# Patient Record
Sex: Female | Born: 1960 | Hispanic: No | Marital: Married | Smoking: Never smoker
Health system: Southern US, Community
[De-identification: ages and names within clinical notes are randomized; demographics above are authoritative.]

## PROBLEM LIST (undated history)

## (undated) DIAGNOSIS — E559 Vitamin D deficiency, unspecified: Secondary | ICD-10-CM

## (undated) DIAGNOSIS — F4322 Adjustment disorder with anxiety: Secondary | ICD-10-CM

## (undated) DIAGNOSIS — K219 Gastro-esophageal reflux disease without esophagitis: Secondary | ICD-10-CM

## (undated) DIAGNOSIS — N390 Urinary tract infection, site not specified: Secondary | ICD-10-CM

## (undated) DIAGNOSIS — Z86718 Personal history of other venous thrombosis and embolism: Secondary | ICD-10-CM

## (undated) DIAGNOSIS — E538 Deficiency of other specified B group vitamins: Secondary | ICD-10-CM

## (undated) DIAGNOSIS — Z7901 Long term (current) use of anticoagulants: Secondary | ICD-10-CM

## (undated) DIAGNOSIS — R079 Chest pain, unspecified: Secondary | ICD-10-CM

## (undated) DIAGNOSIS — M069 Rheumatoid arthritis, unspecified: Secondary | ICD-10-CM

## (undated) DIAGNOSIS — H9209 Otalgia, unspecified ear: Secondary | ICD-10-CM

## (undated) DIAGNOSIS — E785 Hyperlipidemia, unspecified: Secondary | ICD-10-CM

## (undated) DIAGNOSIS — Z8619 Personal history of other infectious and parasitic diseases: Secondary | ICD-10-CM

## (undated) DIAGNOSIS — R002 Palpitations: Secondary | ICD-10-CM

## (undated) DIAGNOSIS — K760 Fatty (change of) liver, not elsewhere classified: Secondary | ICD-10-CM

## (undated) DIAGNOSIS — R51 Headache: Secondary | ICD-10-CM

## (undated) DIAGNOSIS — D509 Iron deficiency anemia, unspecified: Secondary | ICD-10-CM

## (undated) HISTORY — DX: Personal history of other infectious and parasitic diseases: Z86.19

## (undated) HISTORY — DX: Urinary tract infection, site not specified: N39.0

## (undated) HISTORY — DX: Palpitations: R00.2

## (undated) HISTORY — DX: Chest pain, unspecified: R07.9

## (undated) HISTORY — DX: Hyperlipidemia, unspecified: E78.5

## (undated) HISTORY — DX: Gastro-esophageal reflux disease without esophagitis: K21.9

## (undated) HISTORY — DX: Rheumatoid arthritis, unspecified: M06.9

## (undated) HISTORY — DX: Otalgia, unspecified ear: H92.09

## (undated) HISTORY — DX: Headache: R51

## (undated) HISTORY — DX: Iron deficiency anemia, unspecified: D50.9

## (undated) HISTORY — DX: Deficiency of other specified B group vitamins: E53.8

## (undated) HISTORY — DX: Adjustment disorder with anxiety: F43.22

## (undated) HISTORY — DX: Personal history of other venous thrombosis and embolism: Z86.718

## (undated) HISTORY — DX: Vitamin D deficiency, unspecified: E55.9

## (undated) HISTORY — DX: Fatty (change of) liver, not elsewhere classified: K76.0

## (undated) HISTORY — DX: Long term (current) use of anticoagulants: Z79.01

## (undated) HISTORY — PX: ORIF ULNAR FRACTURE: SHX5417

---

## 1999-05-20 ENCOUNTER — Other Ambulatory Visit: Admission: RE | Admit: 1999-05-20 | Discharge: 1999-05-20 | Payer: Self-pay | Admitting: Obstetrics and Gynecology

## 1999-12-10 ENCOUNTER — Encounter: Payer: Self-pay | Admitting: Obstetrics and Gynecology

## 1999-12-10 ENCOUNTER — Encounter: Admission: RE | Admit: 1999-12-10 | Discharge: 1999-12-10 | Payer: Self-pay | Admitting: Obstetrics and Gynecology

## 1999-12-17 ENCOUNTER — Encounter: Admission: RE | Admit: 1999-12-17 | Discharge: 1999-12-17 | Payer: Self-pay | Admitting: Emergency Medicine

## 1999-12-17 ENCOUNTER — Encounter: Payer: Self-pay | Admitting: Emergency Medicine

## 1999-12-27 ENCOUNTER — Encounter: Admission: RE | Admit: 1999-12-27 | Discharge: 1999-12-27 | Payer: Self-pay | Admitting: Emergency Medicine

## 1999-12-27 ENCOUNTER — Encounter: Payer: Self-pay | Admitting: Emergency Medicine

## 2000-07-28 ENCOUNTER — Other Ambulatory Visit: Admission: RE | Admit: 2000-07-28 | Discharge: 2000-07-28 | Payer: Self-pay | Admitting: Unknown Physician Specialty

## 2001-01-01 ENCOUNTER — Encounter: Admission: RE | Admit: 2001-01-01 | Discharge: 2001-01-01 | Payer: Self-pay | Admitting: Obstetrics and Gynecology

## 2001-01-01 ENCOUNTER — Encounter: Payer: Self-pay | Admitting: Obstetrics and Gynecology

## 2001-12-08 ENCOUNTER — Other Ambulatory Visit: Admission: RE | Admit: 2001-12-08 | Discharge: 2001-12-08 | Payer: Self-pay

## 2002-01-06 ENCOUNTER — Encounter: Admission: RE | Admit: 2002-01-06 | Discharge: 2002-01-06 | Payer: Self-pay | Admitting: Obstetrics and Gynecology

## 2002-01-06 ENCOUNTER — Encounter: Payer: Self-pay | Admitting: Obstetrics and Gynecology

## 2003-01-09 ENCOUNTER — Encounter: Admission: RE | Admit: 2003-01-09 | Discharge: 2003-01-09 | Payer: Self-pay | Admitting: Obstetrics and Gynecology

## 2003-01-09 ENCOUNTER — Encounter: Payer: Self-pay | Admitting: Obstetrics and Gynecology

## 2003-02-21 ENCOUNTER — Encounter: Admission: RE | Admit: 2003-02-21 | Discharge: 2003-02-21 | Payer: Self-pay | Admitting: Emergency Medicine

## 2003-02-21 ENCOUNTER — Encounter: Payer: Self-pay | Admitting: Emergency Medicine

## 2003-12-21 ENCOUNTER — Other Ambulatory Visit: Admission: RE | Admit: 2003-12-21 | Discharge: 2003-12-21 | Payer: Self-pay | Admitting: Obstetrics and Gynecology

## 2004-01-01 ENCOUNTER — Encounter: Admission: RE | Admit: 2004-01-01 | Discharge: 2004-01-01 | Payer: Self-pay | Admitting: Obstetrics and Gynecology

## 2005-02-17 ENCOUNTER — Other Ambulatory Visit: Admission: RE | Admit: 2005-02-17 | Discharge: 2005-02-17 | Payer: Self-pay | Admitting: Obstetrics and Gynecology

## 2005-02-26 ENCOUNTER — Encounter: Admission: RE | Admit: 2005-02-26 | Discharge: 2005-02-26 | Payer: Self-pay | Admitting: Obstetrics and Gynecology

## 2005-03-04 ENCOUNTER — Emergency Department (HOSPITAL_COMMUNITY): Admission: EM | Admit: 2005-03-04 | Discharge: 2005-03-04 | Payer: Self-pay | Admitting: Emergency Medicine

## 2005-03-07 ENCOUNTER — Encounter (HOSPITAL_COMMUNITY): Admission: RE | Admit: 2005-03-07 | Discharge: 2005-06-05 | Payer: Self-pay | Admitting: Emergency Medicine

## 2005-04-17 ENCOUNTER — Inpatient Hospital Stay (HOSPITAL_COMMUNITY): Admission: EM | Admit: 2005-04-17 | Discharge: 2005-04-23 | Payer: Self-pay | Admitting: Emergency Medicine

## 2005-04-17 ENCOUNTER — Encounter: Admission: RE | Admit: 2005-04-17 | Discharge: 2005-04-17 | Payer: Self-pay | Admitting: Emergency Medicine

## 2005-04-29 ENCOUNTER — Ambulatory Visit: Payer: Self-pay | Admitting: Internal Medicine

## 2005-05-01 ENCOUNTER — Ambulatory Visit: Payer: Self-pay | Admitting: Cardiology

## 2005-05-01 ENCOUNTER — Ambulatory Visit: Payer: Self-pay

## 2005-05-03 ENCOUNTER — Encounter: Admission: RE | Admit: 2005-05-03 | Discharge: 2005-05-03 | Payer: Self-pay | Admitting: Emergency Medicine

## 2005-05-05 ENCOUNTER — Ambulatory Visit: Payer: Self-pay | Admitting: Cardiology

## 2005-05-08 ENCOUNTER — Ambulatory Visit: Payer: Self-pay | Admitting: Cardiology

## 2005-05-13 ENCOUNTER — Ambulatory Visit: Payer: Self-pay | Admitting: Internal Medicine

## 2005-05-16 ENCOUNTER — Encounter: Admission: RE | Admit: 2005-05-16 | Discharge: 2005-05-16 | Payer: Self-pay | Admitting: Emergency Medicine

## 2005-05-20 ENCOUNTER — Ambulatory Visit: Payer: Self-pay | Admitting: *Deleted

## 2005-05-21 ENCOUNTER — Ambulatory Visit: Payer: Self-pay

## 2005-05-21 ENCOUNTER — Encounter: Payer: Self-pay | Admitting: Cardiovascular Disease

## 2005-05-30 ENCOUNTER — Ambulatory Visit: Payer: Self-pay | Admitting: Internal Medicine

## 2005-06-11 ENCOUNTER — Encounter: Admission: RE | Admit: 2005-06-11 | Discharge: 2005-06-11 | Payer: Self-pay | Admitting: Obstetrics and Gynecology

## 2005-06-12 ENCOUNTER — Ambulatory Visit: Payer: Self-pay | Admitting: Cardiology

## 2005-06-26 ENCOUNTER — Ambulatory Visit: Payer: Self-pay | Admitting: *Deleted

## 2005-07-10 ENCOUNTER — Ambulatory Visit: Payer: Self-pay | Admitting: Cardiology

## 2005-07-17 ENCOUNTER — Encounter: Admission: RE | Admit: 2005-07-17 | Discharge: 2005-07-17 | Payer: Self-pay | Admitting: Emergency Medicine

## 2005-07-24 ENCOUNTER — Ambulatory Visit: Payer: Self-pay | Admitting: Cardiology

## 2005-08-07 ENCOUNTER — Ambulatory Visit: Payer: Self-pay | Admitting: Cardiology

## 2005-08-21 ENCOUNTER — Ambulatory Visit: Payer: Self-pay | Admitting: Cardiology

## 2005-09-04 ENCOUNTER — Ambulatory Visit: Payer: Self-pay | Admitting: Internal Medicine

## 2005-09-15 ENCOUNTER — Ambulatory Visit: Payer: Self-pay | Admitting: Cardiology

## 2005-09-22 ENCOUNTER — Ambulatory Visit: Payer: Self-pay | Admitting: Cardiology

## 2005-09-29 ENCOUNTER — Ambulatory Visit: Payer: Self-pay | Admitting: Cardiology

## 2005-10-01 ENCOUNTER — Ambulatory Visit: Payer: Self-pay | Admitting: Hematology and Oncology

## 2005-10-06 ENCOUNTER — Ambulatory Visit: Payer: Self-pay | Admitting: Internal Medicine

## 2005-11-06 ENCOUNTER — Encounter: Admission: RE | Admit: 2005-11-06 | Discharge: 2005-11-06 | Payer: Self-pay | Admitting: Emergency Medicine

## 2005-12-22 ENCOUNTER — Ambulatory Visit: Payer: Self-pay | Admitting: Hematology and Oncology

## 2005-12-24 LAB — CBC WITH DIFFERENTIAL/PLATELET
BASO%: 1 % (ref 0.0–2.0)
EOS%: 1.3 % (ref 0.0–7.0)
LYMPH%: 40.6 % (ref 14.0–48.0)
MCH: 27.8 pg (ref 26.0–34.0)
MCHC: 33.4 g/dL (ref 32.0–36.0)
MCV: 83.2 fL (ref 81.0–101.0)
MONO%: 6.9 % (ref 0.0–13.0)
NEUT#: 4.1 10*3/uL (ref 1.5–6.5)
Platelets: 330 10*3/uL (ref 145–400)
RBC: 4.91 10*6/uL (ref 3.70–5.32)
RDW: 12.9 % (ref 11.3–14.5)

## 2005-12-24 LAB — COMPREHENSIVE METABOLIC PANEL
AST: 13 U/L (ref 0–37)
Albumin: 4.3 g/dL (ref 3.5–5.2)
Alkaline Phosphatase: 81 U/L (ref 39–117)
Potassium: 4.3 mEq/L (ref 3.5–5.3)
Sodium: 138 mEq/L (ref 135–145)
Total Bilirubin: 0.6 mg/dL (ref 0.3–1.2)
Total Protein: 7.4 g/dL (ref 6.0–8.3)

## 2005-12-30 LAB — HYPERCOAGULABLE PANEL, COMPREHENSIVE
Anticardiolipin IgA: 25 [APL'U] (ref <13)
Anticardiolipin IgG: <7 [GPL'U] (ref <11)
Anticardiolipin IgM: <7 [MPL'U] (ref <10)
Beta-2 Glyco I IgG: <4 U/mL (ref <20)
Beta-2-Glycoprotein I IgM: <4 U/mL (ref <10)
PTT Lupus Anticoagulant: 40.3 secs (ref 36.3–48.8)
Protein C Activity: 143 % (ref 91–147)
Protein C, Total: 73 % (ref 63–153)

## 2005-12-30 LAB — TSH: TSH: 2.214 u[IU]/mL (ref 0.350–5.500)

## 2006-01-05 ENCOUNTER — Ambulatory Visit: Payer: Self-pay | Admitting: Internal Medicine

## 2006-01-09 ENCOUNTER — Ambulatory Visit: Payer: Self-pay | Admitting: Cardiovascular Disease

## 2006-01-16 ENCOUNTER — Ambulatory Visit: Payer: Self-pay | Admitting: Cardiology

## 2006-01-30 ENCOUNTER — Ambulatory Visit: Payer: Self-pay | Admitting: Cardiology

## 2006-02-09 ENCOUNTER — Ambulatory Visit: Payer: Self-pay | Admitting: Cardiology

## 2006-02-23 ENCOUNTER — Ambulatory Visit: Payer: Self-pay | Admitting: Cardiovascular Disease

## 2006-02-25 ENCOUNTER — Ambulatory Visit: Payer: Self-pay | Admitting: Cardiology

## 2006-03-02 ENCOUNTER — Encounter: Admission: RE | Admit: 2006-03-02 | Discharge: 2006-03-02 | Payer: Self-pay | Admitting: Obstetrics and Gynecology

## 2006-03-04 ENCOUNTER — Ambulatory Visit: Payer: Self-pay | Admitting: *Deleted

## 2006-03-11 ENCOUNTER — Ambulatory Visit: Payer: Self-pay

## 2006-03-17 ENCOUNTER — Ambulatory Visit: Payer: Self-pay | Admitting: Cardiology

## 2006-03-27 ENCOUNTER — Ambulatory Visit: Payer: Self-pay | Admitting: Cardiology

## 2006-04-07 ENCOUNTER — Other Ambulatory Visit: Admission: RE | Admit: 2006-04-07 | Discharge: 2006-04-07 | Payer: Self-pay | Admitting: Obstetrics and Gynecology

## 2006-04-10 ENCOUNTER — Ambulatory Visit: Payer: Self-pay | Admitting: Cardiology

## 2006-04-21 ENCOUNTER — Ambulatory Visit: Payer: Self-pay | Admitting: Cardiology

## 2006-05-05 ENCOUNTER — Ambulatory Visit: Payer: Self-pay | Admitting: Cardiology

## 2006-05-15 ENCOUNTER — Ambulatory Visit: Payer: Self-pay | Admitting: Cardiology

## 2006-05-19 LAB — HM COLONOSCOPY

## 2006-05-25 ENCOUNTER — Ambulatory Visit: Payer: Self-pay | Admitting: Hematology and Oncology

## 2006-05-28 LAB — CBC WITH DIFFERENTIAL/PLATELET
BASO%: 0.3 % (ref 0.0–2.0)
HCT: 40.9 % (ref 34.8–46.6)
LYMPH%: 35 % (ref 14.0–48.0)
MCHC: 32.9 g/dL (ref 32.0–36.0)
MONO#: 0.8 10*3/uL (ref 0.1–0.9)
NEUT%: 56.9 % (ref 39.6–76.8)
Platelets: 337 10*3/uL (ref 145–400)
WBC: 11.9 10*3/uL — ABNORMAL HIGH (ref 3.9–10.0)

## 2006-05-29 ENCOUNTER — Ambulatory Visit: Payer: Self-pay | Admitting: Internal Medicine

## 2006-06-01 LAB — CARDIOLIPIN ANTIBODIES, IGG, IGM, IGA: Anticardiolipin IgA: 32 [APL'U] (ref <13)

## 2006-06-01 LAB — LUPUS ANTICOAGULANT PANEL
DRVVT 1:1 Mix: 38.9 secs (ref 31.9–44.2)
DRVVT: 54.7 secs — ABNORMAL HIGH (ref 31.9–44.2)

## 2006-06-01 LAB — FACTOR 8 ASSAY: Coagulation Factor VIII: 177 % — ABNORMAL HIGH (ref 57–124)

## 2006-06-12 ENCOUNTER — Ambulatory Visit: Payer: Self-pay | Admitting: Internal Medicine

## 2006-06-26 ENCOUNTER — Ambulatory Visit: Payer: Self-pay | Admitting: *Deleted

## 2006-07-10 ENCOUNTER — Ambulatory Visit: Payer: Self-pay | Admitting: Internal Medicine

## 2006-07-20 ENCOUNTER — Ambulatory Visit: Payer: Self-pay | Admitting: Cardiovascular Disease

## 2006-07-20 ENCOUNTER — Ambulatory Visit: Payer: Self-pay | Admitting: Cardiology

## 2006-07-30 ENCOUNTER — Ambulatory Visit: Payer: Self-pay | Admitting: Cardiology

## 2006-08-13 ENCOUNTER — Ambulatory Visit: Payer: Self-pay | Admitting: Cardiology

## 2006-08-27 ENCOUNTER — Ambulatory Visit: Payer: Self-pay | Admitting: Cardiology

## 2006-09-10 ENCOUNTER — Ambulatory Visit: Payer: Self-pay | Admitting: Cardiology

## 2006-09-24 ENCOUNTER — Ambulatory Visit: Payer: Self-pay | Admitting: Cardiology

## 2006-10-09 ENCOUNTER — Ambulatory Visit: Payer: Self-pay | Admitting: Internal Medicine

## 2006-10-27 ENCOUNTER — Ambulatory Visit: Payer: Self-pay | Admitting: Cardiology

## 2006-11-09 ENCOUNTER — Ambulatory Visit: Payer: Self-pay | Admitting: Cardiovascular Disease

## 2006-11-12 ENCOUNTER — Encounter: Admission: RE | Admit: 2006-11-12 | Discharge: 2006-11-12 | Payer: Self-pay | Admitting: Emergency Medicine

## 2006-11-23 ENCOUNTER — Encounter: Admission: RE | Admit: 2006-11-23 | Discharge: 2006-11-23 | Payer: Self-pay | Admitting: Emergency Medicine

## 2006-11-23 ENCOUNTER — Ambulatory Visit: Payer: Self-pay | Admitting: Cardiovascular Disease

## 2006-12-25 ENCOUNTER — Ambulatory Visit: Payer: Self-pay | Admitting: Cardiology

## 2007-01-04 ENCOUNTER — Ambulatory Visit: Payer: Self-pay | Admitting: Cardiology

## 2007-01-29 ENCOUNTER — Ambulatory Visit: Payer: Self-pay | Admitting: Hematology and Oncology

## 2007-02-02 LAB — CBC WITH DIFFERENTIAL/PLATELET
Basophils Absolute: 0 10*3/uL (ref 0.0–0.1)
Eosinophils Absolute: 0.1 10*3/uL (ref 0.0–0.5)
HCT: 39.4 % (ref 34.8–46.6)
LYMPH%: 27.2 % (ref 14.0–48.0)
MCV: 80.4 fL — ABNORMAL LOW (ref 81.0–101.0)
MONO#: 0.6 10*3/uL (ref 0.1–0.9)
MONO%: 5.9 % (ref 0.0–13.0)
NEUT#: 6.6 10*3/uL — ABNORMAL HIGH (ref 1.5–6.5)
NEUT%: 65.7 % (ref 39.6–76.8)
Platelets: 393 10*3/uL (ref 145–400)
RBC: 4.9 10*6/uL (ref 3.70–5.32)

## 2007-02-02 LAB — IGG, IGA, IGM: IgG (Immunoglobin G), Serum: 1710 mg/dL — ABNORMAL HIGH (ref 694–1618)

## 2007-02-06 LAB — HYPERCOAGULABLE PANEL, COMPREHENSIVE
AntiThromb III Func: 114 % (ref 76–126)
Anticardiolipin IgA: 31 [APL'U] (ref <13)
Anticardiolipin IgG: <7 [GPL'U] (ref <11)
Anticardiolipin IgM: <7 [MPL'U] (ref <10)
Beta-2-Glycoprotein I IgA: 5 U/mL (ref <10)
DRVVT: 42.8 secs (ref 36.1–47.0)
PTT Lupus Anticoagulant: 48.8 secs (ref 36.3–48.8)
Protein C Activity: 134 % — ABNORMAL HIGH (ref 75–133)
Protein S Ag, Total: 140 % (ref 70–140)

## 2007-02-09 LAB — D-DIMER, QUANTITATIVE: D-Dimer, Quant: 0.23 ug/mL-FEU (ref 0.00–0.48)

## 2007-02-09 LAB — PROTEIN ELECTROPHORESIS, SERUM
Albumin ELP: 51.7 % — ABNORMAL LOW (ref 55.8–66.1)
Alpha-1-Globulin: 3.9 % (ref 2.9–4.9)
Alpha-2-Globulin: 9.8 % (ref 7.1–11.8)
Beta 2: 7 % — ABNORMAL HIGH (ref 3.2–6.5)
Gamma Globulin: 20.4 % — ABNORMAL HIGH (ref 11.1–18.8)

## 2007-03-04 ENCOUNTER — Encounter: Admission: RE | Admit: 2007-03-04 | Discharge: 2007-03-04 | Payer: Self-pay | Admitting: Obstetrics and Gynecology

## 2007-03-10 ENCOUNTER — Ambulatory Visit: Payer: Self-pay | Admitting: Cardiology

## 2007-03-18 ENCOUNTER — Ambulatory Visit: Payer: Self-pay | Admitting: Cardiology

## 2007-03-30 ENCOUNTER — Ambulatory Visit: Payer: Self-pay | Admitting: Cardiology

## 2007-03-30 ENCOUNTER — Ambulatory Visit: Payer: Self-pay | Admitting: Cardiovascular Disease

## 2007-03-31 ENCOUNTER — Ambulatory Visit: Payer: Self-pay

## 2007-04-13 ENCOUNTER — Ambulatory Visit: Payer: Self-pay | Admitting: Internal Medicine

## 2007-04-13 ENCOUNTER — Other Ambulatory Visit: Admission: RE | Admit: 2007-04-13 | Discharge: 2007-04-13 | Payer: Self-pay | Admitting: Obstetrics and Gynecology

## 2007-04-20 ENCOUNTER — Ambulatory Visit: Payer: Self-pay | Admitting: Cardiology

## 2007-04-30 ENCOUNTER — Ambulatory Visit: Payer: Self-pay | Admitting: Cardiology

## 2007-05-11 ENCOUNTER — Ambulatory Visit: Payer: Self-pay | Admitting: Cardiology

## 2007-05-18 ENCOUNTER — Ambulatory Visit: Payer: Self-pay | Admitting: Internal Medicine

## 2007-05-20 HISTORY — PX: TYMPANOSTOMY TUBE PLACEMENT: SHX32

## 2007-06-01 ENCOUNTER — Ambulatory Visit: Payer: Self-pay | Admitting: Cardiovascular Disease

## 2007-06-15 ENCOUNTER — Ambulatory Visit: Payer: Self-pay | Admitting: Cardiovascular Disease

## 2007-06-30 ENCOUNTER — Ambulatory Visit: Payer: Self-pay | Admitting: Cardiology

## 2007-07-13 ENCOUNTER — Ambulatory Visit: Payer: Self-pay | Admitting: Cardiology

## 2007-07-27 ENCOUNTER — Ambulatory Visit: Payer: Self-pay | Admitting: Cardiology

## 2007-08-10 ENCOUNTER — Ambulatory Visit: Payer: Self-pay | Admitting: Cardiovascular Disease

## 2007-08-25 ENCOUNTER — Ambulatory Visit: Payer: Self-pay | Admitting: Cardiovascular Disease

## 2007-09-01 ENCOUNTER — Ambulatory Visit: Payer: Self-pay | Admitting: Cardiology

## 2007-09-08 ENCOUNTER — Ambulatory Visit: Payer: Self-pay | Admitting: Cardiology

## 2007-09-22 ENCOUNTER — Ambulatory Visit: Payer: Self-pay | Admitting: Internal Medicine

## 2007-10-06 ENCOUNTER — Ambulatory Visit: Payer: Self-pay | Admitting: Cardiology

## 2007-10-21 ENCOUNTER — Ambulatory Visit: Payer: Self-pay | Admitting: Cardiology

## 2007-10-21 ENCOUNTER — Ambulatory Visit: Payer: Self-pay | Admitting: Internal Medicine

## 2007-10-27 ENCOUNTER — Encounter: Admission: RE | Admit: 2007-10-27 | Discharge: 2007-10-27 | Payer: Self-pay | Admitting: Otolaryngology

## 2007-11-11 ENCOUNTER — Ambulatory Visit: Payer: Self-pay | Admitting: Cardiology

## 2007-11-18 ENCOUNTER — Ambulatory Visit: Payer: Self-pay | Admitting: Cardiology

## 2007-12-09 ENCOUNTER — Ambulatory Visit: Payer: Self-pay | Admitting: Internal Medicine

## 2007-12-30 ENCOUNTER — Ambulatory Visit: Payer: Self-pay | Admitting: Internal Medicine

## 2008-01-06 ENCOUNTER — Encounter: Admission: RE | Admit: 2008-01-06 | Discharge: 2008-01-06 | Payer: Self-pay | Admitting: Gastroenterology

## 2008-01-20 ENCOUNTER — Ambulatory Visit: Payer: Self-pay | Admitting: Internal Medicine

## 2008-02-10 ENCOUNTER — Ambulatory Visit: Payer: Self-pay | Admitting: Cardiology

## 2008-02-17 LAB — HM MAMMOGRAPHY

## 2008-03-02 ENCOUNTER — Ambulatory Visit: Payer: Self-pay | Admitting: Internal Medicine

## 2008-03-06 ENCOUNTER — Encounter: Admission: RE | Admit: 2008-03-06 | Discharge: 2008-03-06 | Payer: Self-pay | Admitting: Obstetrics and Gynecology

## 2008-03-16 ENCOUNTER — Ambulatory Visit: Payer: Self-pay | Admitting: Cardiovascular Disease

## 2008-03-22 ENCOUNTER — Ambulatory Visit: Payer: Self-pay | Admitting: Cardiovascular Disease

## 2008-03-31 ENCOUNTER — Encounter: Payer: Self-pay | Admitting: Cardiology

## 2008-03-31 ENCOUNTER — Ambulatory Visit: Payer: Self-pay

## 2008-04-06 ENCOUNTER — Ambulatory Visit: Payer: Self-pay | Admitting: Cardiology

## 2008-04-18 ENCOUNTER — Ambulatory Visit: Payer: Self-pay | Admitting: Cardiovascular Disease

## 2008-04-18 ENCOUNTER — Other Ambulatory Visit: Admission: RE | Admit: 2008-04-18 | Discharge: 2008-04-18 | Payer: Self-pay | Admitting: Obstetrics and Gynecology

## 2008-04-18 LAB — HM PAP SMEAR

## 2008-04-25 ENCOUNTER — Encounter: Admission: RE | Admit: 2008-04-25 | Discharge: 2008-04-25 | Payer: Self-pay | Admitting: Rheumatology

## 2008-04-26 ENCOUNTER — Encounter: Payer: Self-pay | Admitting: Internal Medicine

## 2008-04-27 ENCOUNTER — Ambulatory Visit: Payer: Self-pay | Admitting: Cardiology

## 2008-05-18 ENCOUNTER — Ambulatory Visit: Payer: Self-pay | Admitting: Internal Medicine

## 2008-05-22 ENCOUNTER — Encounter: Payer: Self-pay | Admitting: Internal Medicine

## 2008-05-30 ENCOUNTER — Ambulatory Visit: Payer: Self-pay | Admitting: Internal Medicine

## 2008-05-30 DIAGNOSIS — E785 Hyperlipidemia, unspecified: Secondary | ICD-10-CM | POA: Insufficient documentation

## 2008-05-30 DIAGNOSIS — D509 Iron deficiency anemia, unspecified: Secondary | ICD-10-CM | POA: Insufficient documentation

## 2008-05-30 DIAGNOSIS — E559 Vitamin D deficiency, unspecified: Secondary | ICD-10-CM | POA: Insufficient documentation

## 2008-05-30 DIAGNOSIS — R51 Headache: Secondary | ICD-10-CM | POA: Insufficient documentation

## 2008-05-30 DIAGNOSIS — F4322 Adjustment disorder with anxiety: Secondary | ICD-10-CM | POA: Insufficient documentation

## 2008-05-30 DIAGNOSIS — R519 Headache, unspecified: Secondary | ICD-10-CM | POA: Insufficient documentation

## 2008-05-30 DIAGNOSIS — Z86718 Personal history of other venous thrombosis and embolism: Secondary | ICD-10-CM | POA: Insufficient documentation

## 2008-05-30 DIAGNOSIS — M069 Rheumatoid arthritis, unspecified: Secondary | ICD-10-CM | POA: Insufficient documentation

## 2008-05-30 DIAGNOSIS — H9209 Otalgia, unspecified ear: Secondary | ICD-10-CM | POA: Insufficient documentation

## 2008-05-31 ENCOUNTER — Encounter: Payer: Self-pay | Admitting: Internal Medicine

## 2008-06-07 ENCOUNTER — Ambulatory Visit: Payer: Self-pay | Admitting: Internal Medicine

## 2008-06-21 ENCOUNTER — Ambulatory Visit: Payer: Self-pay | Admitting: Cardiology

## 2008-07-05 ENCOUNTER — Encounter: Payer: Self-pay | Admitting: Internal Medicine

## 2008-07-06 ENCOUNTER — Ambulatory Visit: Payer: Self-pay | Admitting: Internal Medicine

## 2008-07-20 ENCOUNTER — Ambulatory Visit: Payer: Self-pay | Admitting: Cardiovascular Disease

## 2008-07-22 ENCOUNTER — Encounter: Admission: RE | Admit: 2008-07-22 | Discharge: 2008-07-22 | Payer: Self-pay | Admitting: Neurology

## 2008-07-24 ENCOUNTER — Encounter: Payer: Self-pay | Admitting: Cardiology

## 2008-07-24 ENCOUNTER — Ambulatory Visit: Payer: Self-pay | Admitting: Internal Medicine

## 2008-07-24 DIAGNOSIS — R002 Palpitations: Secondary | ICD-10-CM | POA: Insufficient documentation

## 2008-07-28 ENCOUNTER — Encounter: Payer: Self-pay | Admitting: Internal Medicine

## 2008-08-10 ENCOUNTER — Ambulatory Visit: Payer: Self-pay | Admitting: Cardiology

## 2008-08-26 DIAGNOSIS — R079 Chest pain, unspecified: Secondary | ICD-10-CM | POA: Insufficient documentation

## 2008-08-30 ENCOUNTER — Encounter: Payer: Self-pay | Admitting: Internal Medicine

## 2008-08-31 ENCOUNTER — Ambulatory Visit: Payer: Self-pay | Admitting: Internal Medicine

## 2008-09-04 ENCOUNTER — Encounter: Payer: Self-pay | Admitting: *Deleted

## 2008-09-04 ENCOUNTER — Encounter: Payer: Self-pay | Admitting: Internal Medicine

## 2008-09-04 LAB — CONVERTED CEMR LAB
TSH: 2.594 microintl units/mL
Vit D, 25-Hydroxy: 38 ng/mL

## 2008-09-14 ENCOUNTER — Ambulatory Visit: Payer: Self-pay | Admitting: Cardiology

## 2008-09-21 ENCOUNTER — Encounter: Payer: Self-pay | Admitting: *Deleted

## 2008-09-26 ENCOUNTER — Ambulatory Visit: Payer: Self-pay | Admitting: Cardiology

## 2008-09-27 ENCOUNTER — Encounter: Payer: Self-pay | Admitting: Internal Medicine

## 2008-09-29 ENCOUNTER — Telehealth: Payer: Self-pay | Admitting: Cardiology

## 2008-10-17 ENCOUNTER — Encounter: Payer: Self-pay | Admitting: *Deleted

## 2008-10-24 ENCOUNTER — Ambulatory Visit: Payer: Self-pay | Admitting: Cardiology

## 2008-10-24 LAB — CONVERTED CEMR LAB: Protime: 13.2

## 2008-11-10 ENCOUNTER — Ambulatory Visit: Payer: Self-pay | Admitting: Cardiology

## 2008-11-10 ENCOUNTER — Encounter (INDEPENDENT_AMBULATORY_CARE_PROVIDER_SITE_OTHER): Payer: Self-pay | Admitting: Cardiology

## 2008-11-10 LAB — CONVERTED CEMR LAB
POC INR: 1.2
Prothrombin Time: 13.8 s

## 2008-11-22 ENCOUNTER — Encounter: Payer: Self-pay | Admitting: *Deleted

## 2008-11-27 ENCOUNTER — Encounter (INDEPENDENT_AMBULATORY_CARE_PROVIDER_SITE_OTHER): Payer: Self-pay | Admitting: Cardiology

## 2008-11-27 ENCOUNTER — Ambulatory Visit: Payer: Self-pay | Admitting: Cardiology

## 2008-11-28 ENCOUNTER — Telehealth: Payer: Self-pay | Admitting: Internal Medicine

## 2008-12-27 ENCOUNTER — Ambulatory Visit: Payer: Self-pay | Admitting: Internal Medicine

## 2008-12-27 LAB — CONVERTED CEMR LAB: POC INR: 1.6

## 2009-01-24 ENCOUNTER — Ambulatory Visit: Payer: Self-pay | Admitting: Cardiology

## 2009-01-24 LAB — CONVERTED CEMR LAB: POC INR: 1.6

## 2009-01-25 ENCOUNTER — Telehealth (INDEPENDENT_AMBULATORY_CARE_PROVIDER_SITE_OTHER): Payer: Self-pay | Admitting: *Deleted

## 2009-02-08 ENCOUNTER — Ambulatory Visit: Payer: Self-pay | Admitting: Internal Medicine

## 2009-02-12 ENCOUNTER — Encounter: Payer: Self-pay | Admitting: Internal Medicine

## 2009-02-19 ENCOUNTER — Encounter: Payer: Self-pay | Admitting: Internal Medicine

## 2009-02-27 ENCOUNTER — Ambulatory Visit: Payer: Self-pay | Admitting: Internal Medicine

## 2009-02-27 LAB — CONVERTED CEMR LAB
ALT: 11 units/L (ref 0–35)
AST: 17 units/L (ref 0–37)
Alkaline Phosphatase: 63 units/L (ref 39–117)
BUN: 8 mg/dL (ref 6–23)
Chloride: 103 meq/L (ref 96–112)
Free T4: 0.8 ng/dL (ref 0.6–1.6)
GFR calc non Af Amer: 80.48 mL/min (ref >60)
LDL Cholesterol: 91 mg/dL (ref 0–99)
Potassium: 4.1 meq/L (ref 3.5–5.1)
Sodium: 141 meq/L (ref 135–145)
TSH: 1.38 microintl units/mL (ref 0.35–5.50)
Total Bilirubin: 1 mg/dL (ref 0.3–1.2)
Total CHOL/HDL Ratio: 2
VLDL: 16.8 mg/dL (ref 0.0–40.0)

## 2009-02-28 ENCOUNTER — Encounter: Payer: Self-pay | Admitting: Internal Medicine

## 2009-03-02 LAB — CONVERTED CEMR LAB: Vit D, 25-Hydroxy: 20 ng/mL — ABNORMAL LOW (ref 30–89)

## 2009-03-06 ENCOUNTER — Ambulatory Visit: Payer: Self-pay | Admitting: Cardiology

## 2009-03-07 ENCOUNTER — Encounter: Admission: RE | Admit: 2009-03-07 | Discharge: 2009-03-07 | Payer: Self-pay | Admitting: Obstetrics and Gynecology

## 2009-03-09 ENCOUNTER — Encounter: Payer: Self-pay | Admitting: Internal Medicine

## 2009-03-13 ENCOUNTER — Encounter: Admission: RE | Admit: 2009-03-13 | Discharge: 2009-03-13 | Payer: Self-pay | Admitting: Obstetrics and Gynecology

## 2009-03-19 ENCOUNTER — Ambulatory Visit: Payer: Self-pay | Admitting: Internal Medicine

## 2009-03-19 DIAGNOSIS — R05 Cough: Secondary | ICD-10-CM

## 2009-03-19 DIAGNOSIS — R053 Chronic cough: Secondary | ICD-10-CM | POA: Insufficient documentation

## 2009-03-21 ENCOUNTER — Telehealth (INDEPENDENT_AMBULATORY_CARE_PROVIDER_SITE_OTHER): Payer: Self-pay | Admitting: *Deleted

## 2009-03-28 ENCOUNTER — Ambulatory Visit: Payer: Self-pay | Admitting: Internal Medicine

## 2009-04-19 ENCOUNTER — Other Ambulatory Visit: Admission: RE | Admit: 2009-04-19 | Discharge: 2009-04-19 | Payer: Self-pay | Admitting: Obstetrics and Gynecology

## 2009-04-26 ENCOUNTER — Ambulatory Visit: Payer: Self-pay | Admitting: Cardiovascular Disease

## 2009-05-23 ENCOUNTER — Encounter: Payer: Self-pay | Admitting: Internal Medicine

## 2009-05-24 ENCOUNTER — Ambulatory Visit: Payer: Self-pay | Admitting: Cardiovascular Disease

## 2009-06-08 ENCOUNTER — Encounter: Payer: Self-pay | Admitting: Internal Medicine

## 2009-06-19 ENCOUNTER — Encounter: Payer: Self-pay | Admitting: Internal Medicine

## 2009-06-21 ENCOUNTER — Ambulatory Visit: Payer: Self-pay | Admitting: Cardiology

## 2009-06-25 ENCOUNTER — Encounter: Admission: RE | Admit: 2009-06-25 | Discharge: 2009-06-25 | Payer: Self-pay | Admitting: Neurology

## 2009-06-25 ENCOUNTER — Encounter: Payer: Self-pay | Admitting: Internal Medicine

## 2009-06-28 ENCOUNTER — Encounter: Payer: Self-pay | Admitting: Internal Medicine

## 2009-06-28 ENCOUNTER — Telehealth: Payer: Self-pay | Admitting: Internal Medicine

## 2009-06-29 ENCOUNTER — Telehealth: Payer: Self-pay | Admitting: *Deleted

## 2009-06-29 ENCOUNTER — Ambulatory Visit: Payer: Self-pay | Admitting: Internal Medicine

## 2009-07-02 ENCOUNTER — Encounter: Admission: RE | Admit: 2009-07-02 | Discharge: 2009-07-02 | Payer: Self-pay | Admitting: Rheumatology

## 2009-07-02 ENCOUNTER — Encounter: Payer: Self-pay | Admitting: Internal Medicine

## 2009-07-13 ENCOUNTER — Encounter: Payer: Self-pay | Admitting: Internal Medicine

## 2009-07-19 ENCOUNTER — Ambulatory Visit: Payer: Self-pay | Admitting: Cardiology

## 2009-07-19 LAB — CONVERTED CEMR LAB: POC INR: 1.5

## 2009-07-26 ENCOUNTER — Encounter: Payer: Self-pay | Admitting: Internal Medicine

## 2009-08-07 ENCOUNTER — Ambulatory Visit: Payer: Self-pay | Admitting: Internal Medicine

## 2009-08-16 ENCOUNTER — Ambulatory Visit: Payer: Self-pay | Admitting: Internal Medicine

## 2009-08-16 LAB — CONVERTED CEMR LAB: POC INR: 1.5

## 2009-08-20 ENCOUNTER — Encounter: Payer: Self-pay | Admitting: Internal Medicine

## 2009-09-05 ENCOUNTER — Telehealth: Payer: Self-pay | Admitting: *Deleted

## 2009-09-17 ENCOUNTER — Ambulatory Visit: Payer: Self-pay | Admitting: Cardiology

## 2009-09-17 LAB — CONVERTED CEMR LAB: POC INR: 1.2

## 2009-09-19 ENCOUNTER — Encounter: Payer: Self-pay | Admitting: Internal Medicine

## 2009-10-02 ENCOUNTER — Encounter: Payer: Self-pay | Admitting: Internal Medicine

## 2009-10-03 ENCOUNTER — Ambulatory Visit: Payer: Self-pay | Admitting: Internal Medicine

## 2009-10-03 LAB — CONVERTED CEMR LAB: POC INR: 1.4

## 2009-10-05 ENCOUNTER — Encounter: Payer: Self-pay | Admitting: Internal Medicine

## 2009-10-16 ENCOUNTER — Encounter: Payer: Self-pay | Admitting: Internal Medicine

## 2009-10-31 ENCOUNTER — Ambulatory Visit: Payer: Self-pay | Admitting: Internal Medicine

## 2009-10-31 LAB — CONVERTED CEMR LAB: POC INR: 1.3

## 2009-11-14 ENCOUNTER — Telehealth: Payer: Self-pay | Admitting: *Deleted

## 2009-11-22 ENCOUNTER — Ambulatory Visit: Payer: Self-pay | Admitting: Internal Medicine

## 2009-11-22 LAB — CONVERTED CEMR LAB: POC INR: 1.3

## 2009-12-13 ENCOUNTER — Ambulatory Visit: Payer: Self-pay | Admitting: Internal Medicine

## 2010-01-10 ENCOUNTER — Ambulatory Visit: Payer: Self-pay | Admitting: Internal Medicine

## 2010-01-10 LAB — CONVERTED CEMR LAB: POC INR: 1.5

## 2010-01-28 ENCOUNTER — Encounter: Admission: RE | Admit: 2010-01-28 | Discharge: 2010-01-28 | Payer: Self-pay | Admitting: Neurology

## 2010-01-28 ENCOUNTER — Encounter: Payer: Self-pay | Admitting: Internal Medicine

## 2010-02-01 ENCOUNTER — Encounter: Payer: Self-pay | Admitting: Internal Medicine

## 2010-02-07 ENCOUNTER — Ambulatory Visit: Payer: Self-pay | Admitting: Cardiology

## 2010-02-25 ENCOUNTER — Ambulatory Visit: Payer: Self-pay | Admitting: Hematology and Oncology

## 2010-02-26 ENCOUNTER — Ambulatory Visit: Payer: Self-pay | Admitting: Psychology

## 2010-02-27 ENCOUNTER — Encounter: Payer: Self-pay | Admitting: Internal Medicine

## 2010-03-06 ENCOUNTER — Ambulatory Visit: Payer: Self-pay | Admitting: Internal Medicine

## 2010-03-13 ENCOUNTER — Encounter: Admission: RE | Admit: 2010-03-13 | Discharge: 2010-03-13 | Payer: Self-pay | Admitting: Internal Medicine

## 2010-04-02 ENCOUNTER — Encounter: Payer: Self-pay | Admitting: Internal Medicine

## 2010-04-03 ENCOUNTER — Ambulatory Visit: Payer: Self-pay | Admitting: Cardiology

## 2010-04-03 LAB — CONVERTED CEMR LAB: POC INR: 1.5

## 2010-04-08 ENCOUNTER — Encounter: Payer: Self-pay | Admitting: Internal Medicine

## 2010-04-09 ENCOUNTER — Ambulatory Visit: Payer: Self-pay | Admitting: Internal Medicine

## 2010-04-09 LAB — CONVERTED CEMR LAB
Albumin: 3.8 g/dL (ref 3.5–5.2)
BUN: 9 mg/dL (ref 6–23)
Basophils Absolute: 0 10*3/uL (ref 0.0–0.1)
Bilirubin Urine: NEGATIVE
CO2: 32 meq/L (ref 19–32)
Calcium: 9.3 mg/dL (ref 8.4–10.5)
Cholesterol: 186 mg/dL (ref 0–200)
Eosinophils Absolute: 0.1 10*3/uL (ref 0.0–0.7)
Glucose, Bld: 92 mg/dL (ref 70–99)
Glucose, Urine, Semiquant: NEGATIVE
HCT: 39.3 % (ref 36.0–46.0)
HDL: 85.9 mg/dL (ref >39.00)
Hemoglobin: 13.2 g/dL (ref 12.0–15.0)
Lymphs Abs: 3 10*3/uL (ref 0.7–4.0)
MCHC: 33.5 g/dL (ref 30.0–36.0)
Neutro Abs: 3.9 10*3/uL (ref 1.4–7.7)
Platelets: 229 10*3/uL (ref 150.0–400.0)
Potassium: 4.9 meq/L (ref 3.5–5.1)
Protein, U semiquant: NEGATIVE
RDW: 13.3 % (ref 11.5–14.6)
Sodium: 143 meq/L (ref 135–145)
Specific Gravity, Urine: 1.025
TSH: 3.19 microintl units/mL (ref 0.35–5.50)
pH: 7

## 2010-04-17 ENCOUNTER — Ambulatory Visit: Payer: Self-pay | Admitting: Internal Medicine

## 2010-05-01 ENCOUNTER — Ambulatory Visit: Payer: Self-pay | Admitting: Cardiology

## 2010-05-01 LAB — CONVERTED CEMR LAB: POC INR: 1.5

## 2010-05-10 ENCOUNTER — Other Ambulatory Visit
Admission: RE | Admit: 2010-05-10 | Discharge: 2010-05-10 | Payer: Self-pay | Source: Home / Self Care | Admitting: Obstetrics and Gynecology

## 2010-05-16 ENCOUNTER — Telehealth: Payer: Self-pay | Admitting: *Deleted

## 2010-05-29 ENCOUNTER — Ambulatory Visit: Admission: RE | Admit: 2010-05-29 | Discharge: 2010-05-29 | Payer: Self-pay | Source: Home / Self Care

## 2010-05-29 LAB — CONVERTED CEMR LAB: POC INR: 1.6

## 2010-06-09 ENCOUNTER — Encounter: Payer: Self-pay | Admitting: Rheumatology

## 2010-06-09 ENCOUNTER — Encounter: Payer: Self-pay | Admitting: Obstetrics and Gynecology

## 2010-06-09 ENCOUNTER — Encounter: Payer: Self-pay | Admitting: Otolaryngology

## 2010-06-18 NOTE — Letter (Signed)
Summary: [facility]  [facility]   Imported By: Maryln Gottron 10/12/2009 15:23:27  _____________________________________________________________________  External Attachment:    Type:   Image     Comment:   External Document

## 2010-06-18 NOTE — Medication Information (Signed)
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 no    Recent/future dental: no  Any missed doses?: no       Is patient compliant with meds? yes       Current Medications (verified): 1)  Plaquenil 200 Mg Tabs (Hydroxychloroquine Sulfate) .Marland Kitchen.. 1 By Mouth Two Times A Day 2)  Prednisone 2.5 Mg Tabs (Prednisone) .Marland Kitchen.. 1 By Mouth Once Daily Can Increase To 5 Mg. 3)  Xanax 0.25 Mg Tabs (Alprazolam) .Marland Kitchen.. 1 Tab Once Daily 4)  Imitrex 50 Mg Tabs (Sumatriptan Succinate) .Marland Kitchen.. 1 By Mouth As Needed. May Repeat in 2-4 Hours If Needed. 5)  Restasis 0.05 % Emul (Cyclosporine) .Marland Kitchen.. 1 Gtt Ou Once Daily 6)  Nascobal 500 Mcg/0.109ml Soln (Cyanocobalamin) .... Use Every Other Week 7)  Coumadin 2.5 Mg Tabs (Warfarin Sodium) .... Use As Directed By Anticoagulation Clinic.  (Daw) 8)  Multigen 70 Mg Tabs (Fe-Succ-C-Thre-B12-Des Stomach) .Marland Kitchen.. 1 Tab Once Daily 9)  Carmol 40 40 % Crea (Urea) .... As Needed 10)  Vitamin D3 2000 Unit Caps (Cholecalciferol) .Marland Kitchen.. 1 Cap Once Daily 11)  Tramadol Hcl 50 Mg Tabs (Tramadol Hcl) .... As Needed 12)  Neurontin 100 Mg Caps (Gabapentin) .... Take 1 Tablet By Mouth Three Times A Day As Needed 13)  Methotrexate 2.5 Mg Tabs (Methotrexate Sodium) .... 4 Tabs Once A Week 14)  Folic Acid 1 Mg Tabs (Folic Acid) .Marland Kitchen.. 1 Tab Two Times A Day 15)  Bromday 0.09 % Soln (Bromfenac Sodium) .Marland Kitchen.. 1 Drop in Each Eye Daily  Allergies: 1)  ! * Ct Dye  Anticoagulation Management History:      The patient is taking warfarin and  comes in today for a routine follow up visit.  Negative risk factors for bleeding include an age less than 35 years old.  The bleeding index is 'low risk'.  Negative CHADS2 values include Age > 38 years old.  The start date was 04/23/2005.  Her last INR was 1.5.  Anticoagulation responsible provider: Myrtis Ser MD, Tinnie Gens.  INR POC: 1.5.  Cuvette Lot#: [id number].  Exp: 03/2011.    Anticoagulation Management Assessment/Plan:      [name] current anticoagulation dose is Coumadin 2.5 mg tabs: Use as directed by anticoagulation clinic.  (DAW).  The target INR is 1.5 - 2.  The next INR is due 05/01/2010.  Anticoagulation instructions were given to patient.  Results were reviewed/authorized by Weston Brass, PharmD.  She was notified by Weston Brass PharmD.         Prior Anticoagulation Instructions: INR 1.5   Continue Coumadin as scheduled: 1 tablet on Sunday, Tuesday, and Thursday, and 2 tablets on Monday, Wednesday, Friday, and Saturday.  Return to clinic in 4 weeks.   Current Anticoagulation Instructions: INR 1.5  Continue same dose of 2 tablets every day except 1 tablet on Sunday, Tuesday and Thursday.  Recheck INR in 4 weeks.

## 2010-06-18 NOTE — Letter (Signed)
Summary: [facility]   Imported By: Maryln Gottron 04/25/2010 13:17:13  _____________________________________________________________________  External Attachment:    Type:   Image     Comment:   External Document

## 2010-06-18 NOTE — Letter (Signed)
Summary: [facility]  [facility]   Imported By: Maryln Gottron 07/11/2009 15:14:12  _____________________________________________________________________  External Attachment:    Type:   Image     Comment:   External Document

## 2010-06-18 NOTE — Letter (Signed)
Summary: [facility]  [facility]   Imported By: Maryln Gottron 08/23/2009 15:37:15  _____________________________________________________________________  External Attachment:    Type:   Image     Comment:   External Document

## 2010-06-18 NOTE — Progress Notes (Signed)
Summary: [facility] called re: status of refill req for xanax  Phone Note From Pharmacy Call back at [phone]    [facility]   Caller: [facility]  [phone]* Summary of Call: Pt came in to pick up refill of xanax. Pls fill asap. Pls notify pt when this has been done.    Initial call taken by: Lucy Antigua,  November 14, 2009 1:40 PM  Follow-up for Phone Call        ok x 3  Follow-up by: [name] MD,  November 14, 2009 2:12 PM  Additional Follow-up for Phone Call Additional follow up Details #1::        See refill request. Additional Follow-up by: Romualdo Bolk, CMA Duncan Dull),  November 14, 2009 2:48 PM

## 2010-06-18 NOTE — Letter (Signed)
Summary: [facility]  [facility]   Imported By: Maryln Gottron 02/13/2010 12:40:03  _____________________________________________________________________  External Attachment:    Type:   Image     Comment:   External Document

## 2010-06-18 NOTE — Assessment & Plan Note (Signed)
Summary: fup//ccm   Vital Signs:  Patient profile:   50 year old female Menstrual status:  irregular Weight:      156 pounds Pulse rate:   80 / minute BP sitting:   110 / 80  (right arm) Cuff size:   regular  Vitals Entered By: Romualdo Bolk, CMA (AAMA) (August 07, 2009 11:22 AM) CC: Follow-up visit from specialist. Pt states that she has sharp stinging pain in her skull that comes and goes. Pt is currently on neurontin 100 3 times. The symptoms went away then came back. Pt states that Dr. Corliss Skains told her that it wasn't Paget's. Neurologist told her to follow up with a bone specialist. So she hasn't seen them yet. Pt is wanting to know if there is anything else that can help.   History of Present Illness: November [place] comesin today for     visit   for multiple concerns.  She brings most recent copies or tests and labs.,  MRI 12 2009 , ct scan and cbc iron crp . ( on b12 and iron)   Abnormal skull x ray: sonce last time had ctr of skull and read as normal and no bagets  However she is conerned and went on internett and asks about bone infection as  ct done without contrast and still hurts .   Neuronitn 100 three times a day initially helped some but now continutin g  shootin and sever enough pain that makes her anxious and tired.  Neuro says not neuro problem  and poss needs bone specialist. and RHeum says not a rheum problem.     RA:    low dose pred  and plaquenil  not enough and conisering MTX in addition.   Worried about potential se .   Asking opinion    Preventive Screening-Counseling & Management  Alcohol-Tobacco     Alcohol drinks/day: 0     Smoking Status: never  Caffeine-Diet-Exercise     Caffeine use/day: 3     Does Patient Exercise: no  Current Medications (verified): 1)  Plaquenil 200 Mg Tabs (Hydroxychloroquine Sulfate) .Marland Kitchen.. 1 By Mouth Two Times A Day 2)  Prednisone 2.5 Mg Tabs (Prednisone) .Marland Kitchen.. 1 By Mouth Once Daily Can Increase To 5 Mg. 3)  Xanax  0.25 Mg Tabs (Alprazolam) .Marland Kitchen.. 1 By Mouth Two Times A Day As Needed 4)  Imitrex 50 Mg Tabs (Sumatriptan Succinate) .Marland Kitchen.. 1 By Mouth As Needed. May Repeat in 2-4 Hours If Needed. 5)  Restasis 0.05 % Emul (Cyclosporine) 6)  Nascobal 500 Mcg/0.16ml Soln (Cyanocobalamin) .... Use Weekly 7)  Lotemax 0.5 % Susp (Loteprednol Etabonate) .... Use As Directed 8)  Coumadin 2.5 Mg Tabs (Warfarin Sodium) .... Use As Directed By Anticoagulation Clinic.  (Daw) 9)  Multigen 70 Mg Tabs (Fe-Succ-C-Thre-B12-Des Stomach) 10)  Carmol 40 40 % Crea (Urea) .... Use Once Daily 11)  Vitamin D3 2000 Unit Caps (Cholecalciferol) 12)  Tramadol Hcl 50 Mg Tabs (Tramadol Hcl) .... As Needed 13)  Neurontin 100 Mg Caps (Gabapentin) .Marland Kitchen.. 1 By Mouth Three Times A Day  Allergies (verified): 1)  ! * Ct Dye  Past History:  Past medical, surgical, family and social histories (including risk factors) reviewed, and no changes noted (except as noted below).  Past Medical History: Reviewed history from 06/29/2009 and no changes required. Current Problems:  CHEST PAIN-UNSPECIFIED (ICD-786.50) PALPITATIONS, RECURRENT (ICD-785.1) PULMONARY EMBOLISM, HX OF (ICD-V12.51)..hx of 04/2005  COUMADIN THERAPY (ICD-V58.61) ADJUSTMENT DISORDER WITH ANXIETY (ICD-309.24) HYPERLIPIDEMIA (ICD-272.4) VITAMIN B12  DEFICIENCY (ICD-266.2) VITAMIN D DEFICIENCY (ICD-268.9) EAR PAIN (ICD-388.70) IRON DEFICIENCY (ICD-280.9) RHEUMATOID ARTHRITIS (ICD-714.0) HEADACHE (ICD-784.0) G1P1 chicken pox as a child  Consults Dr. Georges Lynch Dr. Corliss Skains Dr. Kinnie Scales Dr. Daleen Squibb Dr Suszanne Conners Dohmeir   Dr Brett Fairy       LAST Mammogram: 10/09 Pap: 12/09 Td: within 10 years Colonscopy: 2008 EKG: 2009 Dexa: na Eye Exam: scheduled  Smoking: Never   Past Surgical History: Reviewed history from 05/30/2008 and no changes required. right ear tube  2009   Past History:  Care Management: Cardiology: [facility]: [facility]: Drusilla Kanner   and [facility]. Gynecology: Thomas[facility]  dohmeir ENT TEoh  Family History: Reviewed history from 06/29/2009 and no changes required. Father: Dementia, stroke, HPB, Cholesterol, diabetes Mother: Parkinson's Siblings:  none Lymphoma       Social History: Reviewed history from 06/29/2009 and no changes required. Married Never Smoked Alcohol use-no Drug use-no   Regular exercise-no  HHof 3  20 years in [place]      from [place]  Review of Systems  The patient denies anorexia, fever, weight loss, weight gain, vision loss, abdominal pain, severe indigestion/heartburn, muscle weakness, suspicious skin lesions, abnormal bleeding, and enlarged lymph nodes.         head pain and shooting areas   ? tiny bumps on scalp that are painful.  tends to get very anxious. with new symptom or abnormal data   Physical Exam  General:  alert, well-developed, and well-nourished.  mod anxious nl though process Head:  normocephalic and atraumatic.  pt attendds to very tine [place] bump 1 mm?  she says is tender no redness or bogginess  or hair change with this .  Eyes:  vision grossly intact.  slight stare Ears:  R ear normal and L ear normal.   Neck:  supple, full ROM, and no masses.   Lungs:  normal respiratory effort and no intercostal retractions.   Heart:  normal rate and regular rhythm.   Pulses:  nl cap refill  Neurologic:  nl gait   no ob foacl abnormality  Cervical Nodes:  No lymphadenopathy noted Psych:  Oriented X3, memory intact for recent and remote, good eye contact, not depressed appearing, and moderately anxious.  nl cognition reviewed data  pt brings  MRi 24401 and labs last month  Impression & Recommendations:  Problem # 1:  SKULL/HEAD XRAY, ABNORMAL ? PAGETS (ICD-793.0) no evidence  on CT   .  reassurance  but will discuss withradiologist  .  Problem # 2:  CEPHALGIA (ICD-784.0) problematic and pain interfering .   neuro and rheum  do not  have a diagnosis for this but was given neurontin    and hs some improv with low dose but now back  .. will increase dosing slowly and agree with keeping appt with Neuro.  Her updated medication list for this problem includes:    Imitrex 50 Mg Tabs (Sumatriptan succinate) .Marland Kitchen... 1 by mouth as needed. may repeat in 2-4 hours if needed.    Tramadol Hcl 50 Mg Tabs (Tramadol hcl) .Marland Kitchen... As needed  Problem # 3:  RHEUMATOID ARTHRITIS (ICD-714.0) on plaquenil and low dose pred .   disc  rec to  use mtx  . counseled  Her updated medication list for this problem includes:    Prednisone 2.5 Mg Tabs (Prednisone) .Marland Kitchen... 1 by mouth once daily can increase to 5 mg.  Problem # 4:  IRON DEFICIENCY (ICD-280.9) Assessment: Improved  Her updated medication list for  this problem includes:    Nascobal 500 Mcg/0.33ml Soln (Cyanocobalamin) ..... Use weekly    Multigen 70 Mg Tabs (Fe-succ-c-thre-b12-des stomach)  Complete Medication List: 1)  Plaquenil 200 Mg Tabs (Hydroxychloroquine sulfate) .Marland Kitchen.. 1 by mouth two times a day 2)  Prednisone 2.5 Mg Tabs (Prednisone) .Marland Kitchen.. 1 by mouth once daily can increase to 5 mg. 3)  Xanax 0.25 Mg Tabs (Alprazolam) .Marland Kitchen.. 1 by mouth two times a day as needed 4)  Imitrex 50 Mg Tabs (Sumatriptan succinate) .Marland Kitchen.. 1 by mouth as needed. may repeat in 2-4 hours if needed. 5)  Restasis 0.05 % Emul (Cyclosporine) 6)  Nascobal 500 Mcg/0.34ml Soln (Cyanocobalamin) .... Use weekly 7)  Lotemax 0.5 % Susp (Loteprednol etabonate) .... Use as directed 8)  Coumadin 2.5 Mg Tabs (Warfarin sodium) .... Use as directed by anticoagulation clinic.  (daw) 9)  Multigen 70 Mg Tabs (Fe-succ-c-thre-b12-des stomach) 10)  Carmol 40 40 % Crea (Urea) .... Use once daily 11)  Vitamin D3 2000 Unit Caps (Cholecalciferol) 12)  Tramadol Hcl 50 Mg Tabs (Tramadol hcl) .... As needed 13)  Neurontin 100 Mg Caps (Gabapentin) .Marland Kitchen.. 1 by mouth three times a day  Patient Instructions: 1)  increase  neurontin   slowly  by 100mg    per day each week.  2)  100mg  -100mg  -200mg   3)   Will get copy of  note to  specialist . 4)  Will  talk with radiologist   about discrepancy of skull x ray and CT scan  and then  let you know . plan rov in about a month after above done.   Appended Document: fup//ccm Tell patient that I spoke with Dr Allyson Sabal radiologist who reviewed both images.  States no evidence of   pagets or other bone diesase.   Brain also is normal.   H e  says  the CT is the best to look at any bony problems. He thinks the skull x ray just looks like prominent shadows and is normal.   Also  please fax this note and OV  to Dr Durenda Age and Dr Marylou Flesher  Appended Document: fup//ccm Pt aware of results.

## 2010-06-18 NOTE — Assessment & Plan Note (Signed)
Summary: F1Y/ANAS   Visit Type:  1 yr f/u Primary Provider:  Berniece Andreas MD  CC:  pt c/o heart racing in the AM while she is still in the bed.....  History of Present Illness: [name] returns today for a history of atypical chest pain, palpitations, and a history of a pulmonary embolus still on Coumadin.  She has multiple complaints none of which are cardiac. She knows primary care and has a rheumatologist.  Her only source of pain today is her right neck. It does not start in her chest. It is not associated with exertion.  She her not having too many palpitations. She denies any bleeding.  Current Medications (verified): 1)  Plaquenil 200 Mg Tabs (Hydroxychloroquine Sulfate) .Marland Kitchen.. 1 By Mouth Two Times A Day 2)  Prednisone 2.5 Mg Tabs (Prednisone) .Marland Kitchen.. 1 By Mouth Once Daily Can Increase To 5 Mg. 3)  Xanax 0.25 Mg Tabs (Alprazolam) .Marland Kitchen.. 1 Tab Once Daily 4)  Imitrex 50 Mg Tabs (Sumatriptan Succinate) .Marland Kitchen.. 1 By Mouth As Needed. May Repeat in 2-4 Hours If Needed. 5)  Restasis 0.05 % Emul (Cyclosporine) .Marland Kitchen.. 1 Gtt Ou Once Daily 6)  Nascobal 500 Mcg/0.63ml Soln (Cyanocobalamin) .... Use Every Other Week 7)  Lotemax 0.5 % Susp (Loteprednol Etabonate) .... Use As Directed 8)  Coumadin 2.5 Mg Tabs (Warfarin Sodium) .... Use As Directed By Anticoagulation Clinic.  (Daw) 9)  Multigen 70 Mg Tabs (Fe-Succ-C-Thre-B12-Des Stomach) .Marland Kitchen.. 1 Tab Once Daily 10)  Carmol 40 40 % Crea (Urea) .... As Needed 11)  Vitamin D3 2000 Unit Caps (Cholecalciferol) .Marland Kitchen.. 1 Cap Once Daily 12)  Tramadol Hcl 50 Mg Tabs (Tramadol Hcl) .... As Needed 13)  Neurontin 100 Mg Caps (Gabapentin) .... Take 1 Tablet By Mouth Four Times A Day 14)  Methotrexate 2.5 Mg Tabs (Methotrexate Sodium) .... 4 Tabs Once A Week 15)  Folic Acid 1 Mg Tabs (Folic Acid) .Marland Kitchen.. 1 Tab Two Times A Day  Allergies: 1)  ! * Ct Dye  Past History:  Past Medical History: Last updated: 06/29/2009 Current Problems:  CHEST PAIN-UNSPECIFIED  (ICD-786.50) PALPITATIONS, RECURRENT (ICD-785.1) PULMONARY EMBOLISM, HX OF (ICD-V12.51)..hx of 04/2005  COUMADIN THERAPY (ICD-V58.61) ADJUSTMENT DISORDER WITH ANXIETY (ICD-309.24) HYPERLIPIDEMIA (ICD-272.4) VITAMIN B12 DEFICIENCY (ICD-266.2) VITAMIN D DEFICIENCY (ICD-268.9) EAR PAIN (ICD-388.70) IRON DEFICIENCY (ICD-280.9) RHEUMATOID ARTHRITIS (ICD-714.0) HEADACHE (ICD-784.0) G1P1 chicken pox as a child  Consults Dr. Georges Lynch Dr. Corliss Skains Dr. Kinnie Scales Dr. Daleen Squibb Dr Suszanne Conners Dohmeir   Dr Brett Fairy       LAST Mammogram: 10/09 Pap: 12/09 Td: within 10 years Colonscopy: 2008 EKG: 2009 Dexa: na Eye Exam: scheduled  Smoking: Never   Past Surgical History: Last updated: 05/30/2008 right ear tube  2009   Family History: Last updated: 06/29/2009 Father: Dementia, stroke, HPB, Cholesterol, diabetes Mother: Parkinson's Siblings:  none Lymphoma       Social History: Last updated: 06/29/2009 Married Never Smoked Alcohol use-no Drug use-no   Regular exercise-no  HHof 3  20 years in [place]      from [place]  Risk Factors: Alcohol Use: 0 (08/07/2009) Caffeine Use: 3 (08/07/2009) Exercise: no (08/07/2009)  Risk Factors: Smoking Status: never (08/07/2009)  Review of Systems       negative other than history of present illness  Vital Signs:  Patient profile:   50 year old female Menstrual status:  irregular Height:      63.25 inches Weight:      159 pounds BMI:     28.04 Pulse rate:  68 / minute Pulse rhythm:   regular BP sitting:   116 / 70  (left arm) Cuff size:   large  Vitals Entered By: Danielle Rankin, CMA (Sep 17, 2009 9:47 AM)  Physical Exam  General:  Well developed, well nourished, in no acute distress. Head:  normocephalic and atraumatic Eyes:  PERRLA/EOM intact; conjunctiva and lids normal. Neck:  Neck supple, no JVD. No masses, thyromegaly or abnormal cervical nodes. Chest [place]:  no deformities or breast masses noted Lungs:  Clear bilaterally  to auscultation and percussion. Heart:  Non-displaced PMI, chest non-tender; regular rate and rhythm, S1, S2 without murmurs, rubs or gallops. Carotid upstroke normal, no bruit. Normal abdominal aortic size, no bruits. Femorals normal pulses, no bruits. Pedals normal pulses. No edema, no varicosities. Pulses:  pulses normal in all 4 extremities Extremities:  No clubbing or cyanosis. Neurologic:  Alert and oriented x 3. Skin:  Intact without lesions or rashes. Psych:  Normal affect.   EKG  Procedure date:  09/17/2009  Findings:      normal sinus rhythm, normal EKG  Impression & Recommendations:  Problem # 1:  CHEST PAIN-UNSPECIFIED (ICD-786.50) Assessment Improved  Her updated medication list for this problem includes:    Coumadin 2.5 Mg Tabs (Warfarin sodium) ..... Use as directed by anticoagulation clinic.  (daw)  Her updated medication list for this problem includes:    Coumadin 2.5 Mg Tabs (Warfarin sodium) ..... Use as directed by anticoagulation clinic.  (daw)  Problem # 2:  PALPITATIONS, RECURRENT (ICD-785.1) Assessment: Unchanged  Her updated medication list for this problem includes:    Coumadin 2.5 Mg Tabs (Warfarin sodium) ..... Use as directed by anticoagulation clinic.  (daw)  Her updated medication list for this problem includes:    Coumadin 2.5 Mg Tabs (Warfarin sodium) ..... Use as directed by anticoagulation clinic.  (daw)  Orders: EKG w/ Interpretation (93000)  Problem # 3:  PULMONARY EMBOLISM, HX OF (ICD-V12.51) Assessment: Unchanged  Her updated medication list for this problem includes:    Coumadin 2.5 Mg Tabs (Warfarin sodium) ..... Use as directed by anticoagulation clinic.  (daw)  Her updated medication list for this problem includes:    Coumadin 2.5 Mg Tabs (Warfarin sodium) ..... Use as directed by anticoagulation clinic.  (daw)  Patient Instructions: 1)  Your physician recommends that you schedule a follow-up appointment in: AS NEEDED 2)   Your physician recommends that you continue on your current medications as directed. Please refer to the Current Medication list given to you today.

## 2010-06-18 NOTE — Progress Notes (Signed)
Summary: Referral to [facility]  Phone Note Call from Patient Call back at [facility] Phone [phone] Call back at Work Phone [phone]   Caller: Patient Summary of Call: PT called wanting a referral to Dr. Stoney Bang in [facility] Phone number is [phone] fax [phone]. He is a rheum. This is for head pain and arthritis. Pt wants a 2nd opinion. Initial call taken by: Romualdo Bolk, CMA Duncan Dull),  September 05, 2009 11:31 AM  Follow-up for Phone Call        ok to do referral to DR HAdler for   head pain of unclear etiology in the setting of RA.  with hx of PE   Patient should get her records from Dr Durenda Age and Dr Theresia Bough to sent  in prep for this visit. Follow-up by: [name] MD,  September 06, 2009 8:01 AM  Additional Follow-up for Phone Call Additional follow up Details #1::        Pt aware of this. Additional Follow-up by: Romualdo Bolk, CMA (AAMA),  September 06, 2009 8:26 AM

## 2010-06-18 NOTE — Medication Information (Signed)
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  Bleeding/hemorrhagic complications: no    Recent/future hospitalizations: no    Any changes in medication regimen? yes       Details: Started on Neurontin on 07/17/09.    Recent/future dental: no  Any missed doses?: no       Is patient compliant with meds? yes      Comments: Dr Daleen Squibb came evaluated pt.  per pt's reported history pt did some extra walking around [facility] 2 days ago.  Pedal pulses good.  Pt advised to continue to monitor and call or seek immediate medical attention if pain worsens or other symptoms before mentioned arise, swelling, reddess, warmth, SOB.  Pt agrees.  Allergies: 1)  ! * Ct Dye  Anticoagulation Management History:      The patient is taking warfarin and comes in today for a routine follow up visit.  Negative risk factors for bleeding include an age less than 56 years old.  The bleeding index is 'low risk'.  Negative CHADS2 values include Age > 70 years old.  The start date was 04/23/2005.  Anticoagulation responsible provider: Ladona Ridgel MD, Sharlot Gowda.  INR POC: 1.5.  Cuvette Lot#: [id number].  Exp: 09/2010.    Anticoagulation Management Assessment/Plan:      [name] current anticoagulation dose is Coumadin 2.5 mg tabs: Use as directed by anticoagulation clinic.  (DAW).  The target INR is 1.5 - 2.  The next INR is due 09/13/2009.  Anticoagulation instructions were given to patient.  Results were reviewed/authorized by Cloyde Reams, RN, BSN.  She  was notified by Cloyde Reams RN.         Prior Anticoagulation Instructions: INR 1.5 Continue 2.5mg s daily except 5mg s on Mondays, Wednesdays and Fridays. REcheck in 4 weeks.   Current Anticoagulation Instructions: INR 1.5  Take 2 tablets today, then resume same dosage 1 tablet daily except 2 tablets on Mondays, Wednesdays, and Fridays.  Recheck in 4 weeks.

## 2010-06-18 NOTE — Letter (Signed)
Summary: [facility]  [facility]   Imported By: Maryln Gottron 03/22/2010 10:35:14  _____________________________________________________________________  External Attachment:    Type:   Image     Comment:   External Document

## 2010-06-18 NOTE — Letter (Signed)
Summary: [facility]   Imported By: Maryln Gottron 09/27/2009 14:18:01  _____________________________________________________________________  External Attachment:    Type:   Image     Comment:   External Document

## 2010-06-18 NOTE — Letter (Signed)
Summary: [facility]  [facility]   Imported By: Maryln Gottron 03/29/2010 10:23:46  _____________________________________________________________________  External Attachment:    Type:   Image     Comment:   External Document

## 2010-06-18 NOTE — Letter (Signed)
Summary: Records from [facility] 2010 - 2011  Records from [facility] 2010 - 2011   Imported By: Maryln Gottron 07/04/2009 15:05:09  _____________________________________________________________________  External Attachment:    Type:   Image     Comment:   External Document

## 2010-06-18 NOTE — Medication Information (Signed)
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 no    Recent/future dental: no  Any missed doses?: no       Is patient compliant with meds? yes       Allergies: 1)  ! * Ct Dye  Anticoagulation Management History:      The patient is taking warfarin and comes in today for a routine follow up visit.  Negative risk factors for bleeding include an age less than 51 years old.  The bleeding index is 'low risk'.  Negative CHADS2 values include Age > 65 years old.  The start date was 04/23/2005.  Her last INR was 1.5.  Anticoagulation responsible provider: Shirlee Latch MD, [place].  INR POC: 1.7.  Cuvette Lot#: [id number].  Exp: 03/2011.    Anticoagulation Management Assessment/Plan:      [name] current anticoagulation dose is Coumadin 2.5 mg tabs: Use as directed by anticoagulation clinic.  (DAW).  The target INR is 1.5 - 2.  The next INR is due 03/04/2010.  Anticoagulation instructions were given to patient.  Results were reviewed/authorized by Weston Brass, PharmD.  She was notified by Kennieth Francois.         Prior Anticoagulation Instructions: Continue same dosage: 5mg  daily except 2.5mg  Sun, Tue, Thur.  Current Anticoagulation Instructions: INR 1.7  Continue two tablets every day except for one tablet on Sunday, Tuesday, and Thursday. We will see you in four weeks.

## 2010-06-18 NOTE — Medication Information (Signed)
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 yes       Details: Started Neurotin on Monday.   Recent/future dental: no  Any missed doses?: no       Is patient compliant with meds? yes      Comments: Will tenatively start Mexotrexate in 2 weeks, If start then will take Folic Acid along with it.  Allergies: 1)  ! * Ct Dye  Anticoagulation Management History:      The patient is taking warfarin and comes in today for a routine follow up visit.  Negative risk factors for bleeding include an age less than 4 years old.  The bleeding index is 'low risk'.  Negative CHADS2 values include Age > 55 years old.  The start date was 04/23/2005.  Anticoagulation responsible provider: Jens Som MD, Arlys John.  INR POC: 1.5.  Cuvette Lot#: [id number].  Exp: 09/2010.    Anticoagulation Management Assessment/Plan:      [name] current anticoagulation dose is Coumadin 2.5 mg tabs: Use as directed by anticoagulation clinic.  (DAW).  The target INR is 1.5 - 2.  The next INR is due 08/16/2009.  Anticoagulation instructions were given to patient.  Results were reviewed/authorized by Bethena Midget, RN, BSN.  She was notified by Bethena Midget, RN, BSN.         Prior Anticoagulation Instructions: INR 1.6 at goal 1.5-2  Coumadin 2 tabs = 5mg  Mon, Wed, Fri  and 1 tab = 2.5mg  on Tu, Thur, Sat Sun  Current Anticoagulation Instructions: INR 1.5 Continue 2.5mg s daily except 5mg s on Mondays, Wednesdays and Fridays. REcheck in 4  weeks.

## 2010-06-18 NOTE — Medication Information (Signed)
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 no    Recent/future dental: no  Any missed doses?: no       Is patient compliant with meds? yes       Allergies (verified): 1)  ! * Ct Dye  Anticoagulation Management History:      The patient is taking warfarin and comes in today for a routine follow up visit.  Negative risk factors for bleeding include an age less than 79 years old.  The bleeding index is 'low risk'.  Negative CHADS2 values include Age > 18 years old.  The start date was 04/23/2005.  Anticoagulation responsible provider: Excell Seltzer MD, Casimiro Needle.  INR POC: 1.5.  Cuvette Lot#: [id number].  Exp: 06/2010.    Anticoagulation Management Assessment/Plan:      [name] current anticoagulation dose is Coumadin 2.5 mg tabs: Use as directed by anticoagulation clinic.  (DAW).  The target INR is 1.5 - 2.  The next INR is due 06/21/2009.  Anticoagulation instructions were given to patient.  Results were reviewed/authorized by Leota Sauers, Pharm D .  She was notified by Ysidro Evert, Pharm D Candidate.         Prior Anticoagulation Instructions: INR 1.6 Continue 2.5mg s daily except 5mg s on Mondays, Wednesdays and Fridays. Recheck in 4weeks.   Current Anticoagulation Instructions: INR 1.5 Continue with same dosage 2.5mg  daily except 5mg  on Mondays, Wednesdays and Fridays Recheck 4 weeks

## 2010-06-18 NOTE — Letter (Signed)
[facility]   Imported By: Maryln Gottron 10/22/2009 13:08:15  _____________________________________________________________________  External Attachment:    Type:   Image     Comment:   External Document

## 2010-06-18 NOTE — Medication Information (Signed)
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 yes       Details: Pt now takes methotrexate and folic acid about 2 weeks ago  Recent/future dental: no  Any missed doses?: no       Is patient compliant with meds? yes       Allergies: 1)  ! * Ct Dye  Anticoagulation Management History:      The patient is taking warfarin and comes in today for a routine follow up visit.  Negative risk factors for bleeding include an age less than 94 years old.  The bleeding index is 'low risk'.  Negative CHADS2 values include Age > 92 years old.  The start date was 04/23/2005.  Anticoagulation responsible provider: Juanda Chance MD, Smitty Cords.  INR POC: 1.2.  Cuvette Lot#: [id number].  Exp: 10/2010.    Anticoagulation Management Assessment/Plan:      [name] current anticoagulation dose is Coumadin 2.5 mg tabs: Use as directed by anticoagulation clinic.  (DAW).  The target INR is 1.5 - 2.  The next INR is due 10/03/2009.  Anticoagulation instructions were given to patient.  Results were reviewed/authorized by Eda Keys, PharmD.  She was notified by Eda Keys.         Prior Anticoagulation Instructions: INR 1.5  Take 2 tablets today, then resume same dosage 1 tablet daily except 2 tablets on Mondays, Wednesdays, and Fridays.  Recheck in 4 weeks.    Current Anticoagulation Instructions: INR 1.2  Take 2 tablets today as normal.  Then take 2 tablets tomorrow.  The return to normal dose on wednesday of 2 tablets on Monday,  Wednesday and Friday and 1 tablet all other days.  Return to clinic in 2 weeks.

## 2010-06-18 NOTE — Medication Information (Signed)
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 no    Recent/future dental: no  Any missed doses?: no       Is patient compliant with meds? yes       Allergies: 1)  ! * Ct Dye  Anticoagulation Management History:      The patient is taking warfarin and comes in today for a routine follow up visit.  Negative risk factors for bleeding include an age less than 95 years old.  The bleeding index is 'low risk'.  Negative CHADS2 values include Age > 34 years old.  The start date was 04/23/2005.  Her last INR was 1.5.  Anticoagulation responsible provider: Graciela Husbands MD, Viviann Spare.  INR POC: 1.5.  Cuvette Lot#: [id number].  Exp: 03/2011.    Anticoagulation Management Assessment/Plan:      [name] current anticoagulation dose is Coumadin 2.5 mg tabs: Use as directed by anticoagulation clinic.  (DAW).  The target INR is 1.5 - 2.  The next INR is due 04/03/2010.  Anticoagulation instructions were given to patient.  Results were reviewed/authorized by Weston Brass, PharmD.  She was notified by Haynes Hoehn, PharmD Candidate.         Prior Anticoagulation Instructions: INR 1.7  Continue two tablets every day except for one tablet on Sunday, Tuesday, and Thursday. We will see you in four weeks.   Current Anticoagulation Instructions: INR 1.5   Continue Coumadin as scheduled: 1 tablet on Sunday, Tuesday, and Thursday, and 2 tablets on Monday, Wednesday, Friday, and Saturday.  Return to clinic in 4 weeks.

## 2010-06-18 NOTE — Medication Information (Signed)
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  Health status changes: no    Bleeding/hemorrhagic complications: yes       Details: Incr bruising  Recent/future hospitalizations: no    Any changes in medication regimen? no    Recent/future dental: no  Any missed doses?: no       Is patient compliant with meds? yes       Allergies: 1)  ! * Ct Dye  Anticoagulation Management History:      The patient is taking warfarin and comes in today for a routine follow up visit.  Negative risk factors for bleeding include an age less than 70 years old.  The bleeding index is 'low risk'.  Negative CHADS2 values include Age > 63 years old.  The start date was 04/23/2005.  Anticoagulation responsible provider: Ladona Ridgel MD, Sharlot Gowda.  INR POC: 1.3.  Exp: 12/2010.    Anticoagulation Management Assessment/Plan:      [name] current anticoagulation dose is Coumadin 2.5 mg tabs: Use as directed by anticoagulation clinic.  (DAW).  The target INR is 1.5 - 2.  The next INR is due 12/13/2009.  Anticoagulation instructions were given to patient.  Results were reviewed/authorized by Cloyde Reams, RN, BSN.  She was notified by Cloyde Reams RN.         Prior Anticoagulation Instructions: INR 1.3 Today take 7.5mg s then resume 2.5mg s everyday except 5mg s on Mondays, Wednesdays and Fridays. Recheck in 3 weeks.   Current Anticoagulation Instructions: INR 1.3  Start taking 2 tablets daily except 1 tablet on Sundays, Tuesdays, and Thursdays.  Recheck in 2-3 weeks.

## 2010-06-18 NOTE — Letter (Signed)
Summary: [facility]  [facility]   Imported By: Maryln Gottron 06/27/2009 13:59:06  _____________________________________________________________________  External Attachment:    Type:   Image     Comment:   External Document

## 2010-06-18 NOTE — Medication Information (Signed)
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 no    Recent/future dental: no  Any missed doses?: no       Is patient compliant with meds? yes       Allergies: 1)  ! * Ct Dye  Anticoagulation Management History:      The patient is taking warfarin and comes in today for a routine follow up visit.  Negative risk factors for bleeding include an age less than 70 years old.  The bleeding index is 'low risk'.  Negative CHADS2 values include Age > 16 years old.  The start date was 04/23/2005.  Anticoagulation responsible provider: Bensimhon MD, Reuel Boom.  INR POC: 1.3.  Cuvette Lot#: [id number].  Exp: 12/2010.    Anticoagulation Management Assessment/Plan:      [name] current anticoagulation dose is Coumadin 2.5 mg tabs: Use as directed by anticoagulation clinic.  (DAW).  The target INR is 1.5 - 2.  The next INR is due 11/22/2009.  Anticoagulation instructions were given to patient.  Results were reviewed/authorized by Bethena Midget, RN, BSN.  She was notified by Bethena Midget, RN, BSN.         Prior Anticoagulation Instructions: INR 1.4 On Thursday take 5mg s then continue 2.5mg s everyday except 5mg s on Monday, Wednesdays, and Fridays. Recheck in 4 weeks.   Current Anticoagulation Instructions: INR 1.3 Today take 7.5mg s then resume 2.5mg s everyday except 5mg s on Mondays, Wednesdays and Fridays. Recheck in 3 weeks.

## 2010-06-18 NOTE — Progress Notes (Signed)
Summary: ? 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 She is having very deep skull pain. Dr. Vickey Huger said that she was suppose to call our office last night about this. Pt is wanting some pain meds for this. Dr. Vickey Huger states that she can't do anything about this. They only done the x-ray.  Initial call taken by: Romualdo Bolk, CMA Duncan Dull),  June 28, 2009 8:17 AM  Follow-up for Phone Call        Per Dr. Fabian Sharp- let's get the info from Dr. Oliva Bustard office and see her tomorrow at 11:30 Pt aware and appt rescheduled. Follow-up by: Romualdo Bolk, CMA Duncan Dull),  June 28, 2009 8:27 AM  Additional Follow-up for Phone Call Additional follow up Details #1::        I called Dr. Oliva Bustard office and they are to fax info now. Additional Follow-up by: Romualdo Bolk, CMA (AAMA),  June 28, 2009 9:41 AM

## 2010-06-18 NOTE — Letter (Signed)
Summary: [facility]  [facility]   Imported By: Maryln Gottron 07/10/2009 14:18:32  _____________________________________________________________________  External Attachment:    Type:   Image     Comment:   External Document

## 2010-06-18 NOTE — Letter (Signed)
Summary: [facility]  [facility]   Imported By: Maryln Gottron 07/11/2009 15:16:14  _____________________________________________________________________  External Attachment:    Type:   Image     Comment:   External Document

## 2010-06-18 NOTE — Medication Information (Signed)
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 no    Recent/future dental: no  Any missed doses?: no       Is patient compliant with meds? yes       Current Medications (verified): 1)  Plaquenil 200 Mg Tabs (Hydroxychloroquine Sulfate) .Marland Kitchen.. 1 By Mouth Two Times A Day 2)  Prednisone 2.5 Mg Tabs (Prednisone) .Marland Kitchen.. 1 By Mouth Once Daily Can Increase To 5 Mg. 3)  Xanax 0.25 Mg Tabs (Alprazolam) .Marland Kitchen.. 1 Tab Once Daily 4)  Imitrex 50 Mg Tabs (Sumatriptan Succinate) .Marland Kitchen.. 1 By Mouth As Needed. May Repeat in 2-4 Hours If Needed. 5)  Restasis 0.05 % Emul (Cyclosporine) .Marland Kitchen.. 1 Gtt Ou Once Daily 6)  Nascobal 500 Mcg/0.45ml Soln (Cyanocobalamin) .... Use Every Other Week 7)  Lotemax 0.5 % Susp (Loteprednol Etabonate) .... Use As Directed 8)  Coumadin 2.5 Mg Tabs (Warfarin Sodium) .... Use As Directed By Anticoagulation Clinic.  (Daw) 9)  Multigen 70 Mg Tabs (Fe-Succ-C-Thre-B12-Des Stomach) .Marland Kitchen.. 1 Tab Once Daily 10)  Carmol 40 40 % Crea (Urea) .... As Needed 11)  Vitamin D3 2000 Unit Caps (Cholecalciferol) .Marland Kitchen.. 1 Cap Once Daily 12)  Tramadol Hcl 50 Mg Tabs (Tramadol Hcl) .... As Needed 13)  Neurontin 100 Mg Caps (Gabapentin) .... Take 1 Tablet By Mouth Three Times A Day 14)  Methotrexate 2.5 Mg Tabs (Methotrexate Sodium) .... 4 Tabs Once A Week 15)  Folic Acid 1 Mg Tabs (Folic Acid) .Marland Kitchen.. 1 Tab Two Times A Day  Allergies (verified): 1)  ! * Ct Dye  Anticoagulation Management History:      The patient is taking warfarin  and comes in today for a routine follow up visit.  Negative risk factors for bleeding include an age less than 26 years old.  The bleeding index is 'low risk'.  Negative CHADS2 values include Age > 24 years old.  The start date was 04/23/2005.  Today's INR is 1.5.  Anticoagulation responsible provider: Bensimhon MD, Reuel Boom.  INR POC: 1.5.  Cuvette Lot#: [id number].  Exp: 02/17/2011.    Anticoagulation Management Assessment/Plan:      [name] current anticoagulation dose is Coumadin 2.5 mg tabs: Use as directed by anticoagulation clinic.  (DAW).  The target INR is 1.5 - 2.  The next INR is due 01/10/2010.  Anticoagulation instructions were given to patient.  Results were reviewed/authorized by Charolotte Eke, PharmD.  She was notified by Charolotte Eke, PharmD.         Prior Anticoagulation Instructions: INR 1.3  Start taking 2 tablets daily except 1 tablet on Sundays, Tuesdays, and Thursdays.  Recheck in 2-3 weeks.    Current Anticoagulation Instructions: Continue 1 tablet Sun/Tue/Thu, 2 tablets Mon/Wed/Fri/Sat.

## 2010-06-18 NOTE — Medication Information (Signed)
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 no    Recent/future dental: no  Any missed doses?: no       Is patient compliant with meds? yes       Current Medications (verified): 1)  Plaquenil 200 Mg Tabs (Hydroxychloroquine Sulfate) .Marland Kitchen.. 1 By Mouth Two Times A Day 2)  Prednisone 2.5 Mg Tabs (Prednisone) .Marland Kitchen.. 1 By Mouth Once Daily 3)  Xanax 0.25 Mg Tabs (Alprazolam) .Marland Kitchen.. 1 By Mouth Two Times A Day As Needed 4)  Imitrex 50 Mg Tabs (Sumatriptan Succinate) .Marland Kitchen.. 1 By Mouth As Needed. May Repeat in 2-4 Hours If Needed. 5)  Restasis 0.05 % Emul (Cyclosporine) 6)  Nascobal 500 Mcg/0.5ml Soln (Cyanocobalamin) .... Use Weekly 7)  Lotemax 0.5 % Susp (Loteprednol Etabonate) .... Use As Directed 8)  Coumadin 2.5 Mg Tabs (Warfarin Sodium) .... Use As Directed By Anticoagulation Clinic.  (Daw) 9)  Multigen 70 Mg Tabs (Fe-Succ-C-Thre-B12-Des Stomach) 10)  Carmol 40 40 % Crea (Urea) .... Use Once Daily 11)  Vitamin D (Ergocalciferol) 50000 Unit Caps (Ergocalciferol) .Marland Kitchen.. 1 By Mouth Weekly 12)  Zithromax 250 Mg Tabs (Azithromycin) .... Take 2 By Mouth First Day and 1 By Mouth Once Daily For 4 More Days 13)  Hydrocodone-Homatropine 5-1.5 Mg/15ml Syrp (Hydrocodone-Homatropine) .Marland Kitchen.. 1-2 Tsp By Mouth Q4-6 Hours For Cough At Night  Allergies (verified): 1)  ! * Ct Dye  Anticoagulation Management History:      The patient is taking warfarin and comes in today for a routine follow up visit.  Negative risk factors for bleeding  include an age less than 14 years old.  The bleeding index is 'low risk'.  Negative CHADS2 values include Age > 52 years old.  The start date was 04/23/2005.  Anticoagulation responsible provider: Juanda Chance MD, Smitty Cords.  INR POC: 1.6.  Cuvette Lot#: [id number].  Exp: 08/2010.    Anticoagulation Management Assessment/Plan:      [name] current anticoagulation dose is Coumadin 2.5 mg tabs: Use as directed by anticoagulation clinic.  (DAW).  The target INR is 1.5 - 2.  The next INR is due 07/19/2009.  Anticoagulation instructions were given to patient.  Results were reviewed/authorized by Leota Sauers, Pharm D .         Prior Anticoagulation Instructions: INR 1.5 Continue with same dosage 2.5mg  daily except 5mg  on Mondays, Wednesdays and Fridays Recheck 4 weeks  Current Anticoagulation Instructions: INR 1.6 at goal 1.5-2  Coumadin 2 tabs = 5mg  Mon, Wed, Fri  and 1 tab = 2.5mg  on Tu, Thur, Sat Sun

## 2010-06-18 NOTE — Progress Notes (Signed)
Summary: Can't order CT of head [facility]  Phone Note Other Incoming Call back at [phone]   Caller: Dr[facility] Summary of Call: Pt called them and is wanting her CT done at [facility]. Fleet Contras at Dr. Fatima Sanger office called saying that the can't order the referral there and is wanting [place] to order the CT of the Head there. Can we do this? Initial call taken by: Romualdo Bolk, CMA Duncan Dull),  June 29, 2009 3:48 PM  Follow-up for Phone Call        Call Fleet Contras and find out what test they need Follow-up by: Romualdo Bolk, CMA Duncan Dull),  June 29, 2009 3:50 PM  Additional Follow-up for Phone Call Additional follow up Details #1::        Spoke with Fleet Contras and they need a CT of the Head with Thin slices. I told Fleet Contras that we would order the CT and contact pt about this. Then send results to them. 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   Need CT scan  to further   delineate lesion.  Please make sure radiologist has access to the  plain films for  review .    copy of results should be sent  to Dr Lacey Jensen and Dr Richardean Chimera. Follow-up by: [name] MD,  June 29, 2009 5:45 PM  Additional Follow-up for Phone Call Additional follow up Details #3:: Details for Additional Follow-up Action Taken: Order sent to [facility]. Additional Follow-up by: Romualdo Bolk, CMA (AAMA),  July 02, 2009 9:38 AM

## 2010-06-18 NOTE — Medication Information (Signed)
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 yes       Details: Methotrexate will increase on Sunday to 6 tab per week  Recent/future dental: no  Any missed doses?: no       Is patient compliant with meds? yes      Comments: Will decrease intake of green veggies  Current Medications (verified): 1)  Plaquenil 200 Mg Tabs (Hydroxychloroquine Sulfate) .Marland Kitchen.. 1 By Mouth Two Times A Day 2)  Prednisone 2.5 Mg Tabs (Prednisone) .Marland Kitchen.. 1 By Mouth Once Daily Can Increase To 5 Mg. 3)  Xanax 0.25 Mg Tabs (Alprazolam) .Marland Kitchen.. 1 Tab Once Daily 4)  Imitrex 50 Mg Tabs (Sumatriptan Succinate) .Marland Kitchen.. 1 By Mouth As Needed. May Repeat in 2-4 Hours If Needed. 5)  Restasis 0.05 % Emul (Cyclosporine) .Marland Kitchen.. 1 Gtt Ou Once Daily 6)  Nascobal 500 Mcg/0.2ml Soln (Cyanocobalamin) .... Use Every Other Week 7)  Lotemax 0.5 % Susp (Loteprednol Etabonate) .... Use As Directed 8)  Coumadin 2.5 Mg Tabs (Warfarin Sodium) .... Use As Directed By Anticoagulation Clinic.  (Daw) 9)  Multigen 70 Mg Tabs (Fe-Succ-C-Thre-B12-Des Stomach) .Marland Kitchen.. 1 Tab Once Daily 10)  Carmol 40 40 % Crea (Urea) .... As Needed 11)  Vitamin D3 2000 Unit Caps (Cholecalciferol) .Marland Kitchen.. 1 Cap Once Daily 12)  Tramadol Hcl 50 Mg Tabs (Tramadol Hcl) .... As Needed 13)  Neurontin 100 Mg Caps (Gabapentin) .... Take 1 Tablet By Mouth Three Times A Day 14)  Methotrexate 2.5 Mg Tabs (Methotrexate Sodium) .... 4 Tabs Once A Week 15)  Folic Acid 1 Mg  Tabs (Folic Acid) .Marland Kitchen.. 1 Tab Two Times A Day  Allergies: 1)  ! * Ct Dye  Anticoagulation Management History:      The patient is taking warfarin and comes in today for a routine follow up visit.  Negative risk factors for bleeding include an age less than 74 years old.  The bleeding index is 'low risk'.  Negative CHADS2 values include Age > 78 years old.  The start date was 04/23/2005.  Anticoagulation responsible provider: Reinhart Saulters MD, Reuel Boom.  INR POC: 1.4.  Cuvette Lot#: [id number].  Exp: 12/2010.    Anticoagulation Management Assessment/Plan:      [name] current anticoagulation dose is Coumadin 2.5 mg tabs: Use as directed by anticoagulation clinic.  (DAW).  The target INR is 1.5 - 2.  The next INR is due 10/31/2009.  Anticoagulation instructions were given to patient.  Results were reviewed/authorized by Bethena Midget, RN, BSN.  She was notified by Bethena Midget, RN, BSN.         Prior Anticoagulation Instructions: INR 1.2  Take 2 tablets today as normal.  Then take 2 tablets tomorrow.  The return to normal dose on wednesday of 2 tablets on Monday, Wednesday and Friday and 1 tablet all other days.  Return to clinic in 2 weeks.    Current Anticoagulation Instructions: INR 1.4 On Thursday take 5mg s then continue 2.5mg s everyday except 5mg s on Monday, Wednesdays, and Fridays.  Recheck in 4 weeks.

## 2010-06-18 NOTE — Letter (Signed)
Summary: [facility]   [facility]   Imported By: Maryln Gottron 07/26/2009 12:46:22  _____________________________________________________________________  External Attachment:    Type:   Image     Comment:   External Document

## 2010-06-18 NOTE — Medication Information (Signed)
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 yes       Details: lotomax changed to bromday  Recent/future dental: no  Any missed doses?: no       Is patient compliant with meds? yes       Current Medications (verified): 1)  Plaquenil 200 Mg Tabs (Hydroxychloroquine Sulfate) .Marland Kitchen.. 1 By Mouth Two Times A Day 2)  Prednisone 2.5 Mg Tabs (Prednisone) .Marland Kitchen.. 1 By Mouth Once Daily Can Increase To 5 Mg. 3)  Xanax 0.25 Mg Tabs (Alprazolam) .Marland Kitchen.. 1 Tab Once Daily 4)  Imitrex 50 Mg Tabs (Sumatriptan Succinate) .Marland Kitchen.. 1 By Mouth As Needed. May Repeat in 2-4 Hours If Needed. 5)  Restasis 0.05 % Emul (Cyclosporine) .Marland Kitchen.. 1 Gtt Ou Once Daily 6)  Nascobal 500 Mcg/0.58ml Soln (Cyanocobalamin) .... Use Every Other Week 7)  Coumadin 2.5 Mg Tabs (Warfarin Sodium) .... Use As Directed By Anticoagulation Clinic.  (Daw) 8)  Multigen 70 Mg Tabs (Fe-Succ-C-Thre-B12-Des Stomach) .Marland Kitchen.. 1 Tab Once Daily 9)  Carmol 40 40 % Crea (Urea) .... As Needed 10)  Vitamin D3 2000 Unit Caps (Cholecalciferol) .Marland Kitchen.. 1 Cap Once Daily 11)  Tramadol Hcl 50 Mg Tabs (Tramadol Hcl) .... As Needed 12)  Neurontin 100 Mg Caps (Gabapentin) .... Take 1 Tablet By Mouth Three Times A Day 13)  Methotrexate 2.5 Mg Tabs (Methotrexate Sodium) .... 4 Tabs Once A Week 14)  Folic Acid 1 Mg Tabs (Folic Acid) .Marland Kitchen.. 1 Tab Two Times A Day 15)  Bromday 0.09 % Soln (Bromfenac Sodium) .Marland Kitchen.. 1 Drop in Each Eye Daily  Allergies (verified): 1)  ! * Ct Dye  Anticoagulation Management  History:      The patient is taking warfarin and comes in today for a routine follow up visit.  Negative risk factors for bleeding include an age less than 25 years old.  The bleeding index is 'low risk'.  Negative CHADS2 values include Age > 49 years old.  The start date was 04/23/2005.  Her last INR was 1.5.  Anticoagulation responsible provider: Graciela Husbands MD, Viviann Spare.  INR POC: 1.5.  Cuvette Lot#: [id number].  Exp: 02/17/2011.    Anticoagulation Management Assessment/Plan:      [name] current anticoagulation dose is Coumadin 2.5 mg tabs: Use as directed by anticoagulation clinic.  (DAW).  The target INR is 1.5 - 2.  The next INR is due 02/07/2010.  Anticoagulation instructions were given to patient.  Results were reviewed/authorized by Charolotte Eke, PharmD.  She was notified by Charolotte Eke, PharmD.         Prior Anticoagulation Instructions: Continue 1 tablet Sun/Tue/Thu, 2 tablets Mon/Wed/Fri/Sat.  Current Anticoagulation Instructions: Continue same dosage: 5mg  daily except 2.5mg  Sun, Tue, Thur.

## 2010-06-18 NOTE — Assessment & Plan Note (Signed)
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 LMP (date): 03/05/2009 LMP - Character: heavy Menarche (age onset years): 11   Menses interval (days): Irrg Menstrual flow (days): 7-10 Enter LMP: 03/05/2009   History of Present Illness: [name] comesin today for   per  urgent reques to discuss an abnormal skul x ray done by Dr Richardean Chimera in the context of her evaluation for head pain.    . We have requested and recieved a plain x ray report and last OV from Dr D.   . Pt said dr D was going to call me about this . she was told she could have PAgets .     taking pain medication for pain  and got  Saw  extender at  Dr Learta Codding  yesterday and received Tramadol       one helps some. Did  some lab work.    she shows the lab slip  CMP   pending and  plan of follow up depending on results   Patient is very anxious and says her head pain in occiput area is bad.   for a while  .  Has numerous questions and worried about a tumor.    She has not gone to counseling about her anxiety prev because of   medical expenses and bills that are a good amount.    Preventive Screening-Counseling & Management  Alcohol-Tobacco     Alcohol drinks/day: 0     Smoking Status: never  Caffeine-Diet-Exercise     Caffeine use/day: 3     Does Patient Exercise: no  Current Medications (verified): 1)  Plaquenil 200 Mg Tabs (Hydroxychloroquine Sulfate) .Marland Kitchen.. 1 By Mouth Two Times A Day 2)  Prednisone 2.5 Mg Tabs (Prednisone) .Marland Kitchen.. 1 By Mouth Once Daily Can Increase To 5 Mg. 3)  Xanax 0.25 Mg Tabs (Alprazolam) .Marland Kitchen.. 1 By Mouth Two Times  A Day As Needed 4)  Imitrex 50 Mg Tabs (Sumatriptan Succinate) .Marland Kitchen.. 1 By Mouth As Needed. May Repeat in 2-4 Hours If Needed. 5)  Restasis 0.05 % Emul (Cyclosporine) 6)  Nascobal 500 Mcg/0.24ml Soln (Cyanocobalamin) .... Use Weekly 7)  Lotemax 0.5 % Susp (Loteprednol Etabonate) .... Use As Directed 8)  Coumadin 2.5 Mg Tabs (Warfarin Sodium) .... Use As Directed By Anticoagulation Clinic.  (Daw) 9)  Multigen 70 Mg Tabs (Fe-Succ-C-Thre-B12-Des Stomach) 10)  Carmol 40 40 % Crea (Urea) .... Use Once Daily 11)  Vitamin D3 2000 Unit Caps (Cholecalciferol) 12)  Tramadol Hcl 50 Mg Tabs (Tramadol Hcl) .... As Needed  Allergies (verified): 1)  ! * Ct Dye  Past History:  Past medical, surgical, family and social histories (including risk factors) reviewed, and no changes noted (except as noted below).  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 Georges Lynch Dr. Corliss Skains Dr. Kinnie Scales Dr. Daleen Squibb Dr Suszanne Conners Dohmeir   Dr Brett Fairy       LAST Mammogram: 10/09 Pap: 12/09 Td: within 10 years Colonscopy: 2008 EKG: 2009 Dexa: na Eye Exam: scheduled  Smoking: Never   Past Surgical History: Reviewed history from 05/30/2008 and no changes required. right ear tube  2009   Past History:  Care Management: Cardiology: [facility]ology: [facility] Rheumatology: Drusilla Kanner  and [facility]. Gynecology: [facility]  dohmeir ENT TE[facility]  Family History: Reviewed history from 07/24/2008 and no changes required. Father: Dementia, stroke, HPB, Cholesterol,  diabetes Mother: Parkinson's Siblings:  none Lymphoma       Social History: Reviewed history from 07/24/2008 and no changes required. Married Never Smoked Alcohol use-no Drug use-no   Regular exercise-no  HHof 3  20 years in [place]      from [place]  Review of Systems       The patient complains of headaches.  The patient denies anorexia, fever, weight loss, weight gain, vision loss, decreased hearing, dyspnea on exertion, prolonged cough, hemoptysis, muscle weakness, transient blindness, difficulty walking, enlarged lymph nodes, and angioedema.    Physical Exam  General:  alert, well-developed, well-nourished, and well-hydrated.  anxious but well appearing  Head:  normocephalic, atraumatic, no abnormalities observed, and no abnormalities palpated.  points to ocipital parietal midline to righnt  no skin lesions Eyes:  vision grossly intact.  glasses  Ears:  R ear normal and L ear normal.   Neck:  supple and no masses.   Lungs:  normal respiratory effort and no intercostal retractions.   Heart:  normal rate, regular rhythm, and no murmur.   Extremities:  some veing on le  right leg with small amt induration over a vein  no skin changes and no tenderness Neurologic:  alert & oriented X3, strength normal in all extremities, and gait normal.   Skin:  turgor normal, color normal, no petechiae, and no purpura.   Cervical Nodes:  No lymphadenopathy noted Psych:  Oriented X3, good eye contact, and moderately anxious.   see  ov and x ray report   Impression & Recommendations:  Problem # 1:  SKULL/HEAD XRAY, ABNORMAL (ICD-793.0) radiologist  reads  consider pagets .   labs pending .    Problem # 2:  CEPHALGIA (ICD-784.0) cephalgia   evaluated by neuro   asks about gabapentin but would wait until evaluated as she may not tolerated se  well with her anxiety.  Her updated medication list for this problem includes:    Imitrex 50 Mg Tabs (Sumatriptan succinate) .Marland Kitchen... 1 by mouth as needed.  may repeat in 2-4 hours if needed.    Tramadol Hcl 50 Mg Tabs (Tramadol hcl) .Marland Kitchen... 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  disc    about counseling to help  this stuation . she will ocnisder this .    cognitive therapy could help  her with strategies.   ok to use benzos as needed in the meantime.   Problem # 4:  RHEUMATOID ARTHRITIS (ICD-714.0)  Her updated medication list for this problem includes:    Prednisone 2.5 Mg Tabs (Prednisone) .Marland Kitchen... 1 by mouth once daily can increase to 5 mg.  Complete Medication List: 1)  Plaquenil 200 Mg Tabs (Hydroxychloroquine sulfate) .Marland KitchenMarland KitchenMarland Kitchen  1 by mouth two times a day 2)  Prednisone 2.5 Mg Tabs (Prednisone) .Marland Kitchen.. 1 by mouth once daily can increase to 5 mg. 3)  Xanax 0.25 Mg Tabs (Alprazolam) .Marland Kitchen.. 1 by mouth two times a day as needed 4)  Imitrex 50 Mg Tabs (Sumatriptan succinate) .Marland Kitchen.. 1 by mouth as needed. may repeat in 2-4 hours if needed. 5)  Restasis 0.05 % Emul (Cyclosporine) 6)  Nascobal 500 Mcg/0.40ml Soln (Cyanocobalamin) .... Use weekly 7)  Lotemax 0.5 % Susp (Loteprednol etabonate) .... Use as directed 8)  Coumadin 2.5 Mg Tabs (Warfarin sodium) .... Use as directed by anticoagulation clinic.  (daw) 9)  Multigen 70 Mg Tabs (Fe-succ-c-thre-b12-des stomach) 10)  Carmol 40 40 % Crea (Urea) .... Use once daily 11)  Vitamin D3 2000 Unit Caps (Cholecalciferol) 12)  Tramadol Hcl 50 Mg Tabs (Tramadol hcl) .... As needed  Patient Instructions: 1)  will   contact Dr D and Dohmeirs office to coordinate best next step such a poss imaging and to see the results of the blood tests.   2)  then we can contact you  about plan. and  treatment.   May need to be on daily Fosamax. Prescriptions: XANAX 0.25 MG TABS (ALPRAZOLAM) 1 by mouth two times a day as needed Brand medically necessary #60 x 1   Entered and Authorized by:   [name] MD   Signed by:   [name] MD on 06/29/2009   Method used:   Print then Give to Patient   RxID:   [id number]  contacted Dr Learta Codding office after pt left. They had already contacted radiologist  and  and they were going to arrange a Ct and then evaluate . Apparently her CMP is nl by   report.   .   prolonged visit and time    45 minutes   greater than 50% of visit spent in counseling  .   WKP

## 2010-06-19 ENCOUNTER — Encounter (INDEPENDENT_AMBULATORY_CARE_PROVIDER_SITE_OTHER): Payer: BC Managed Care – PPO

## 2010-06-19 ENCOUNTER — Encounter: Payer: Self-pay | Admitting: Internal Medicine

## 2010-06-19 ENCOUNTER — Encounter: Payer: Self-pay | Admitting: Cardiology

## 2010-06-19 ENCOUNTER — Ambulatory Visit (INDEPENDENT_AMBULATORY_CARE_PROVIDER_SITE_OTHER): Payer: BC Managed Care – PPO | Admitting: Cardiology

## 2010-06-19 DIAGNOSIS — Z7901 Long term (current) use of anticoagulants: Secondary | ICD-10-CM

## 2010-06-19 DIAGNOSIS — R002 Palpitations: Secondary | ICD-10-CM

## 2010-06-19 DIAGNOSIS — I2699 Other pulmonary embolism without acute cor pulmonale: Secondary | ICD-10-CM

## 2010-06-19 LAB — CONVERTED CEMR LAB: POC INR: 1.7

## 2010-06-20 ENCOUNTER — Ambulatory Visit (INDEPENDENT_AMBULATORY_CARE_PROVIDER_SITE_OTHER): Payer: BC Managed Care – PPO | Admitting: Psychology

## 2010-06-20 DIAGNOSIS — F411 Generalized anxiety disorder: Secondary | ICD-10-CM

## 2010-06-20 NOTE — Medication Information (Signed)
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 no    Recent/future dental: no  Any missed doses?: no       Is patient compliant with meds? yes       Allergies: 1)  ! * Ct Dye  Anticoagulation Management History:      The patient is taking warfarin and comes in today for a routine follow up visit.  Negative risk factors for bleeding include an age less than 24 years old.  The bleeding index is 'low risk'.  Negative CHADS2 values include Age > 67 years old.  The start date was 04/23/2005.  Her last INR was 1.5.  Anticoagulation responsible provider: Shirlee Latch MD, Dalton.  INR POC: 1.5.  Cuvette Lot#: [id number].  Exp: 03/2011.    Anticoagulation Management Assessment/Plan:      [name] current anticoagulation dose is Coumadin 2.5 mg tabs: Use as directed by anticoagulation clinic.  (DAW).  The target INR is 1.5 - 2.  The next INR is due 05/29/2010.  Anticoagulation instructions were given to patient.  Results were reviewed/authorized by Tammy Sours PharmD.         Prior Anticoagulation Instructions: INR 1.5  Continue same dose of 2 tablets every day except 1 tablet on Sunday, Tuesday and Thursday.  Recheck INR in 4 weeks.   Current Anticoagulation Instructions: INR 1.5  Continue current regimen of 2 tablets everday except 1 tablet on Sunday, Tuesday, and Thursday.  Recheck INR in 4 weeks

## 2010-06-20 NOTE — Assessment & Plan Note (Signed)
Summary: cpx//ccm   Vital Signs:  Patient profile:   50 year old female Menstrual status:  irregular Height:      63.25 inches Weight:      156 pounds Temp:     98.0 degrees F oral Pulse rate:   72 / minute Pulse rhythm:   regular BP sitting:   118 / 90  (left arm) Cuff size:   regular  Vitals Entered By: Alfred Levins, CMA (April 17, 2010 2:54 PM) CC: cpx, no pap   History of Present Illness: [name] comes in today  for preventive visit . She has multiple medical problems  but since her last visit has actually been doing better. Head pain : better and just weaned off med  neurontin and will take as needed.  Anemia iron deficience and b12 has been low She has seen  Dr Kinnie Scales :  who rec she stay on iron and b12 to avoid  hg from dropping and to check about every 6 months. Would like to do this here  ir as needed. Is vegetarian.   GYNE :  to have appt next week Mammo 10 11 and normal  RA : stable no change in meds and some better. CRP now nl  Anxiety stable for now .   Abnormal skull scan   skull x ray repeated  to ensure no abnormality and  felet to be better and no acute findings. She does not have pagets.  Was on higher dose vit d   Preventive Screening-Counseling & Management  Alcohol-Tobacco     Alcohol drinks/day: 0     Smoking Status: never  Caffeine-Diet-Exercise     Caffeine use/day: 3     Does Patient Exercise: no  Hep-HIV-STD-Contraception     Sun Exposure-Excessive: no  Safety-Violence-Falls     Seat Belt Use: yes     Smoke Detectors: yes  Current Medications (verified): 1)  Plaquenil 200 Mg Tabs (Hydroxychloroquine Sulfate) .Marland Kitchen.. 1 By Mouth Two Times A Day 2)  Prednisone 2.5 Mg Tabs (Prednisone) .Marland Kitchen.. 1 By Mouth Once Daily Can Increase To 5 Mg. 3)  Xanax 0.25 Mg Tabs (Alprazolam) .Marland Kitchen.. 1 Tab Once Daily 4)  Imitrex 50 Mg Tabs (Sumatriptan Succinate) .Marland Kitchen.. 1 By Mouth As Needed. May Repeat in 2-4 Hours If Needed. 5)  Restasis 0.05 % Emul  (Cyclosporine) .Marland Kitchen.. 1 Gtt Ou Once Daily 6)  Nascobal 500 Mcg/0.52ml Soln (Cyanocobalamin) .... Use Every Other Week 7)  Coumadin 2.5 Mg Tabs (Warfarin Sodium) .... Use As Directed By Anticoagulation Clinic.  (Daw) 8)  Multigen 70 Mg Tabs (Fe-Succ-C-Thre-B12-Des Stomach) .Marland Kitchen.. 1 Tab Once Daily 9)  Carmol 40 40 % Crea (Urea) .... As Needed 10)  Vitamin D3 2000 Unit Caps (Cholecalciferol) .Marland Kitchen.. 1 Cap Once Daily 11)  Methotrexate 2.5 Mg Tabs (Methotrexate Sodium) .... 4 Tabs Once A Week 12)  Folic Acid 1 Mg Tabs (Folic Acid) .Marland Kitchen.. 1 Tab Two Times A Day 13)  Bromday 0.09 % Soln (Bromfenac Sodium) .Marland Kitchen.. 1 Drop in Each Eye Daily  Allergies (verified): 1)  ! * Ct Dye  Past History:  Past Medical History: Current Problems:  CHEST PAIN-UNSPECIFIED (ICD-786.50) PALPITATIONS, RECURRENT (ICD-785.1) PULMONARY EMBOLISM, HX OF (ICD-V12.51)..hx of 04/2005  COUMADIN THERAPY (ICD-V58.61) ADJUSTMENT DISORDER WITH ANXIETY (ICD-309.24) HYPERLIPIDEMIA (ICD-272.4) VITAMIN B12 DEFICIENCY (ICD-266.2) VITAMIN D DEFICIENCY (ICD-268.9) EAR PAIN (ICD-388.70) IRON DEFICIENCY (ICD-280.9) vegetarian  GI eval  RHEUMATOID ARTHRITIS (ICD-714.0) HEADACHE (ICD-784.0) G1P1 chicken pox as a child  Consults Dr. Georges Lynch Dr. Corliss Skains Dr. Kinnie Scales  Dr. Daleen Squibb Dr Suszanne Conners Dohmeir   Dr Brett Fairy       LAST Mammogram: 10/09 Pap: 12/09 Td: within 10 years Colonscopy: 2008 EKG: 2009 Dexa: na Eye Exam: scheduled  Smoking: Never   Past History:  Care Management: Cardiology: [facility] Gastroenterology: [facility] Rheumatology: Drusilla Kanner  and [facility]. and also saw Dr Adline Mango x 1 for opinion Gynecology: [facility]  dohmeir ENT TEoh  Social History: Married Never Smoked Alcohol use-no Drug use-no   Regular exercise-no HHof 3  20 years in [place]      from [place] Son at [employer] Use:  yes Sun Exposure-Excessive:  no  Review of Systems  The patient denies anorexia, fever, weight loss, weight gain, vision  loss, hoarseness, prolonged cough, abdominal pain, melena, hematochezia, severe indigestion/heartburn, hematuria, difficulty walking, unusual weight change, enlarged lymph nodes, angioedema, and breast masses.         lmmp 10 21 wpotting  before april    Physical Exam  General:  Well-developed,well-nourished,in no acute distress; alert,appropriate and cooperative throughout examination Head:  Normocephalic and atraumatic without obvious abnormalities. No apparent alopecia or balding. Eyes:  PERRL, EOMs full, conjunctiva clear  Ears:  R ear normal, L ear normal, and no external deformities.   Nose:  no external deformity and no nasal discharge.   Mouth:  good dentition and pharynx pink and moist.   Neck:  supple, full ROM, and no masses.   Breasts:  No mass, nodules, thickening, tenderness, bulging, retraction, inflamation, nipple discharge or skin changes noted.   Lungs:  Normal respiratory effort, chest expands symmetrically. Lungs are clear to auscultation, no crackles or wheezes.no dullness.   Heart:  Normal rate and regular rhythm. S1 and S2 normal without gallop, murmur, click, rub or other extra sounds.PMI normal.   Abdomen:  Bowel sounds positive,abdomen soft and non-tender without masses, organomegaly or   noted. Genitalia:  per gyne Msk:  no joint warmth, no redness over joints, and no joint deformities.   Pulses:  pulses intact without delay   Extremities:  some vv  no acute changes Neurologic:  alert & oriented X3, strength normal in all extremities, gait normal, and DTRs symmetrical and normal.   Skin:  turgor normal, color normal, no ecchymoses, and no petechiae.   Cervical Nodes:  No lymphadenopathy noted Axillary Nodes:  No palpable lymphadenopathy Inguinal Nodes:  No significant adenopathy Psych:  Oriented X3, normally interactive, good eye contact, and not depressed appearing.  much less anxious but still some anxiety    Impression & Recommendations:  Problem # 1:   PREVENTIVE HEALTH CARE (ICD-V70.0) counseled      parameters UTD .   Problem # 2:  RHEUMATOID ARTHRITIS (ICD-714.0)  Her updated medication list for this problem includes:    Prednisone 2.5 Mg Tabs (Prednisone) .Marland Kitchen... 1 by mouth once daily can increase to 5 mg.    Methotrexate 2.5 Mg Tabs (Methotrexate sodium) .Marland KitchenMarland KitchenMarland KitchenMarland Kitchen 4 tabs once a week  Problem # 3:  HEADACHE (ICD-784.0) Assessment: Improved  The following medications were removed from the medication list:    Tramadol Hcl 50 Mg Tabs (Tramadol hcl) .Marland Kitchen... As needed Her updated medication list for this problem includes:    Imitrex 50 Mg Tabs (Sumatriptan succinate) .Marland Kitchen... 1 by mouth as needed. may repeat in 2-4 hours if needed.  Problem # 4:  ADJUSTMENT DISORDER WITH ANXIETY (ICD-309.24) Assessment: Improved still  worrier but much better  Problem # 5:  VITAMIN B12 DEFICIENCY (ICD-266.2) now on  nose spray   is vegetarian  last level  647 NOvember 16   Problem # 6:  VITAMIN D DEFICIENCY (ICD-268.9) ok to try otc dosing    Problem # 7:  IRON DEFICIENCY (ICD-280.9) is vegetarian  last level in NOv  nl cbc and saturation 30% and iron 92 Her updated medication list for this problem includes:    Nascobal 500 Mcg/0.77ml Soln (Cyanocobalamin) ..... Use every other week    Multigen 70 Mg Tabs (Fe-succ-c-thre-b12-des stomach) .Marland Kitchen... 1 tab once daily    Folic Acid 1 Mg Tabs (Folic acid) .Marland Kitchen... 1 tab two times a day  Problem # 8:  SKULL/HEAD XRAY, ABNORMAL ? PAGETS (ICD-793.0) Assessment: Improved resolved    issue  normal   Problem # 9:  CEPHALGIA (ICD-784.0) Assessment: Improved using gaba pentin as needed  The following medications were removed from the medication list:    Tramadol Hcl 50 Mg Tabs (Tramadol hcl) .Marland Kitchen... As needed Her updated medication list for this problem includes:    Imitrex 50 Mg Tabs (Sumatriptan succinate) .Marland Kitchen... 1 by mouth as needed. may repeat in 2-4 hours if needed.  Complete Medication List: 1)  Plaquenil 200 Mg  Tabs (Hydroxychloroquine sulfate) .Marland Kitchen.. 1 by mouth two times a day 2)  Prednisone 2.5 Mg Tabs (Prednisone) .Marland Kitchen.. 1 by mouth once daily can increase to 5 mg. 3)  Xanax 0.25 Mg Tabs (Alprazolam) .Marland Kitchen.. 1 tab once daily 4)  Imitrex 50 Mg Tabs (Sumatriptan succinate) .Marland Kitchen.. 1 by mouth as needed. may repeat in 2-4 hours if needed. 5)  Restasis 0.05 % Emul (Cyclosporine) .Marland Kitchen.. 1 gtt ou once daily 6)  Nascobal 500 Mcg/0.33ml Soln (Cyanocobalamin) .... Use every other week 7)  Coumadin 2.5 Mg Tabs (Warfarin sodium) .... Use as directed by anticoagulation clinic.  (daw) 8)  Multigen 70 Mg Tabs (Fe-succ-c-thre-b12-des stomach) .Marland Kitchen.. 1 tab once daily 9)  Carmol 40 40 % Crea (Urea) .... As needed 10)  Vitamin D3 2000 Unit Caps (Cholecalciferol) .Marland Kitchen.. 1 cap once daily 11)  Methotrexate 2.5 Mg Tabs (Methotrexate sodium) .... 4 tabs once a week 12)  Folic Acid 1 Mg Tabs (Folic acid) .Marland Kitchen.. 1 tab two times a day 13)  Bromday 0.09 % Soln (Bromfenac sodium) .Marland Kitchen.. 1 drop in each eye daily  Patient Instructions: 1)  decrease vitamin D to 1000 international units per day 2)  vitamin D , CBCdiff, IBC panel Vitamin B12 , HSCRP   Dx iron deficincy  B12 deficiency  , RA  3)  In 6 months    4)  Call pharmacy or [place] for refills of the meds we discussed . 5)  return office visit in 6-12 months or asneeded.    Orders Added: 1)  Est. Patient 40-64 years [99396] 2)  Est. Patient Level III [04540]

## 2010-06-20 NOTE — Progress Notes (Signed)
Summary: Pt req to pick up copy of last lab results. Pick up 05/17/10   Phone Note Call from Patient Call back at Home Phone [phone]   Caller: Patient Summary of Call: Pt called and is req to pick up a copy of last lab work results. Pts son has appt with Dr. Clent Ridges on 05/17/10 at 10am and would like to pick up copy of her lab results then.  Initial call taken by: Lucy Antigua,  May 16, 2010 10:49 AM  Follow-up for Phone Call        Copy up front for pt to pick up Follow-up by: Romualdo Bolk, CMA Duncan Dull),  May 16, 2010 11:34 AM

## 2010-06-20 NOTE — Medication Information (Signed)
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 no    Recent/future dental: no  Any missed doses?: no       Is patient compliant with meds? yes       Allergies: 1)  ! * Ct Dye  Anticoagulation Management History:      The patient is taking warfarin and comes in today for a routine follow up visit.  Negative risk factors for bleeding include an age less than 33 years old.  The bleeding index is 'low risk'.  Negative CHADS2 values include Age > 79 years old.  The start date was 04/23/2005.  Her last INR was 1.5.  Anticoagulation responsible provider: Zierra Laroque MD, Fayrene Fearing.  INR POC: 1.6.  Cuvette Lot#: [id number].  Exp: 06/2011.    Anticoagulation Management Assessment/Plan:      [name] current anticoagulation dose is Coumadin 2.5 mg tabs: Use as directed by anticoagulation clinic.  (DAW).  The target INR is 1.5 - 2.  The next INR is due 06/26/2010.  Anticoagulation instructions were given to patient.  Results were reviewed/authorized by Weston Brass, PharmD.  She was notified by Stephannie Peters, PharmD Candidate .         Prior Anticoagulation Instructions: INR 1.5  Continue current regimen of 2 tablets everday except 1 tablet on Sunday, Tuesday, and Thursday.  Recheck INR in 4 weeks   Current Anticoagulation Instructions: INR 1.6  Coumadin 2.5 mg tablets - Take 2 tablets every day except 1 tablet on Sundays, Tuesdays and Thursdays

## 2010-06-26 NOTE — Medication Information (Signed)
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 no    Recent/future dental: no  Any missed doses?: no       Is patient compliant with meds? yes       Allergies: 1)  ! * Ct Dye  Anticoagulation Management History:      The patient is taking warfarin and comes in today for a routine follow up visit.  Negative risk factors for bleeding include an age less than 82 years old.  The bleeding index is 'low risk'.  Negative CHADS2 values include Age > 65 years old.  The start date was 04/23/2005.  Her last INR was 1.5.  Anticoagulation responsible provider: Ladona Ridgel MD, Sharlot Gowda.  INR POC: 1.7.  Cuvette Lot#: [id number].  Exp: 06/2011.    Anticoagulation Management Assessment/Plan:      [name] current anticoagulation dose is Coumadin 2.5 mg tabs: Use as directed by anticoagulation clinic.  (DAW).  The target INR is 1.5 - 2.  The next INR is due 07/17/2010.  Anticoagulation instructions were given to patient.  Results were reviewed/authorized by Tammy Sours, PharmD.         Prior Anticoagulation Instructions: INR 1.6  Coumadin 2.5 mg tablets - Take 2 tablets every day except 1 tablet on Sundays, Tuesdays and Thursdays   Current Anticoagulation Instructions: INR 1.7  Continue taking 2 tablets (5mg ) everday except take 1 tablet (2.5 mg) on Sundays, Tuesdays, Thursdays. Recheck INR in 4 weeks.

## 2010-06-26 NOTE — Assessment & Plan Note (Signed)
Summary: rapid heartbeats/per pt call=mj   Vital Signs:  Patient profile:   50 year old female Menstrual status:  irregular Height:      63 inches Weight:      153 pounds BMI:     27.20 Pulse rate:   61 / minute Pulse rhythm:   regular BP sitting:   106 / 68  (left arm)  Vitals Entered By: Lisabeth Devoid RN (June 19, 2010 12:12 PM)  Primary Provider:  Berniece Andreas MD  CC:  tachycardia, sweating, and cough.  History of Present Illness: [name] returns today for her history of palpitations. She's also has a history of a pulmonary embolus followed at [facility]. She's been advised she can come off of Coumadin though she is anxious to do so. She has  negotiated with her physician there to stay between 1.5 and 2.0. Her last 4 INRs have been between 1.5 and 2.  She has palpitations with the feeling of a fast heartbeat in the morning before she gets out of bed. They do not wake her up. She denies any other symptoms other than becoming a little bit sweaty feeling. She denies any syncope. She does not have them during the day or with exertion. They do not stop abruptly.  Her EKG is normal today.  Current Medications (verified): 1)  Plaquenil 200 Mg Tabs (Hydroxychloroquine Sulfate) .Marland Kitchen.. 1 By Mouth Two Times A Day 2)  Prednisone 2.5 Mg Tabs (Prednisone) .Marland Kitchen.. 1 By Mouth Once Daily Can Increase To 5 Mg. 3)  Xanax 0.25 Mg Tabs (Alprazolam) .Marland Kitchen.. 1 Tab Once Daily 4)  Imitrex 50 Mg Tabs (Sumatriptan Succinate) .Marland Kitchen.. 1 By Mouth As Needed. May Repeat in 2-4 Hours If Needed. 5)  Restasis 0.05 % Emul (Cyclosporine) .Marland Kitchen.. 1 Gtt Ou Once Daily 6)  Nascobal 500 Mcg/0.81ml Soln (Cyanocobalamin) .... Use Every Other Week 7)  Coumadin 2.5 Mg Tabs (Warfarin Sodium) .... Use As Directed By Anticoagulation Clinic.  (Daw) 8)  Multigen 70 Mg Tabs (Fe-Succ-C-Thre-B12-Des Stomach) .Marland Kitchen.. 1 Tab Once Daily 9)  Carmol 40 40 % Crea (Urea) .... As Needed 10)  Vitamin D3 2000 Unit Caps (Cholecalciferol) .Marland Kitchen.. 1 Cap Once  Daily 11)  Methotrexate 2.5 Mg Tabs (Methotrexate Sodium) .... 8 Tabs Once A Week 12)  Folic Acid 1 Mg Tabs (Folic Acid) .Marland Kitchen.. 1 Tab Two Times A Day 13)  Bromday 0.09 % Soln (Bromfenac Sodium) .Marland Kitchen.. 1 Drop in Each Eye Daily  Allergies (verified): 1)  ! * Ct Dye  Past History:  Past Medical History: Last updated: 04/17/2010 Current Problems:  CHEST PAIN-UNSPECIFIED (ICD-786.50) PALPITATIONS, RECURRENT (ICD-785.1) PULMONARY EMBOLISM, HX OF (ICD-V12.51)..hx of 04/2005  COUMADIN THERAPY (ICD-V58.61) ADJUSTMENT DISORDER WITH ANXIETY (ICD-309.24) HYPERLIPIDEMIA (ICD-272.4) VITAMIN B12 DEFICIENCY (ICD-266.2) VITAMIN D DEFICIENCY (ICD-268.9) EAR PAIN (ICD-388.70) IRON DEFICIENCY (ICD-280.9) vegetarian  GI eval  RHEUMATOID ARTHRITIS (ICD-714.0) HEADACHE (ICD-784.0) G1P1 chicken pox as a child  Consults Dr. Georges Lynch Dr. Corliss Skains Dr. Kinnie Scales Dr. Daleen Squibb Dr Suszanne Conners Dohmeir   Dr Brett Fairy       LAST Mammogram: 10/09 Pap: 12/09 Td: within 10 years Colonscopy: 2008 EKG: 2009 Dexa: na Eye Exam: scheduled  Smoking: Never   Past Surgical History: Last updated: 05/30/2008 right ear tube  2009   Family History: Last updated: 06/29/2009 Father: Dementia, stroke, HPB, Cholesterol, diabetes Mother: Parkinson's Siblings:  none Lymphoma       Social History: Last updated: 04/17/2010 Married Never Smoked Alcohol use-no Drug use-no   Regular exercise-no HHof 3  20  years in [place]      from [place] Son at [employer]  Risk Factors: Alcohol Use: 0 (04/17/2010) Caffeine Use: 3 (04/17/2010) Exercise: no (04/17/2010)  Risk Factors: Smoking Status: never (04/17/2010)  Review of Systems       negative other than history of present illness  Physical Exam  General:  no acute distress, very anxious Head:  normocephalic and atraumatic Eyes:  PERRLA/EOM intact; conjunctiva and lids normal. Neck:  Neck supple, no JVD. No masses, thyromegaly or abnormal cervical nodes. Chest Shakisha Abend:   no deformities or breast masses noted Lungs:  Clear bilaterally to auscultation and percussion. Heart:  PMI nondisplaced, regular rate and rhythm, normal S1-S2, no gallop or murmur or click. Carotids full without bruits Msk:  Back normal, normal gait. Muscle strength and tone normal. Pulses:  pulses normal in all 4 extremities Extremities:  No clubbing or cyanosis. Neurologic:  Alert and oriented x 3. Skin:  Intact without lesions or rashes. Psych:  Normal affect.   Impression & Recommendations:  Problem # 1:  PALPITATIONS, RECURRENT (ICD-785.1) Assessment Unchanged I feel these are benign and have reinforced this with the patient. Her updated medication list for this problem includes:    Coumadin 2.5 Mg Tabs (Warfarin sodium) ..... Use as directed by anticoagulation clinic.  (daw)  Orders: EKG w/ Interpretation (93000)  Patient Instructions: 1)  Your physician recommends that you schedule a follow-up appointment in: as needed with Dr. Daleen Squibb 2)  Your physician recommends that you continue on your current medications as directed. Please refer to the Current Medication list given to you today.   Orders Added: 1)  EKG w/ Interpretation [93000]

## 2010-07-15 DIAGNOSIS — Z7901 Long term (current) use of anticoagulants: Secondary | ICD-10-CM | POA: Insufficient documentation

## 2010-07-15 DIAGNOSIS — I2699 Other pulmonary embolism without acute cor pulmonale: Secondary | ICD-10-CM | POA: Insufficient documentation

## 2010-07-15 DIAGNOSIS — R760 Raised antibody titer: Secondary | ICD-10-CM | POA: Insufficient documentation

## 2010-07-17 ENCOUNTER — Encounter (INDEPENDENT_AMBULATORY_CARE_PROVIDER_SITE_OTHER): Payer: BC Managed Care – PPO

## 2010-07-17 ENCOUNTER — Encounter: Payer: Self-pay | Admitting: Internal Medicine

## 2010-07-17 DIAGNOSIS — I2699 Other pulmonary embolism without acute cor pulmonale: Secondary | ICD-10-CM

## 2010-07-17 DIAGNOSIS — Z7901 Long term (current) use of anticoagulants: Secondary | ICD-10-CM

## 2010-07-17 LAB — CONVERTED CEMR LAB: POC INR: 1.6

## 2010-08-05 ENCOUNTER — Other Ambulatory Visit: Payer: Self-pay | Admitting: *Deleted

## 2010-08-05 NOTE — Telephone Encounter (Signed)
Pt has been getting this from Dr. Kinnie Scales. Pharmacy sent a message to [place] wanting [place] to refill this. Is this okay to refill? Last filled 02/16/10 #3

## 2010-08-06 NOTE — Medication Information (Signed)
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 no    Recent/future dental: no  Any missed doses?: no       Is patient compliant with meds? yes       Allergies: 1)  ! * Ct Dye  Anticoagulation Management History:      The patient is taking warfarin and comes in today for a routine follow up visit.  Negative risk factors for bleeding include an age less than 38 years old.  The bleeding index is 'low risk'.  Negative CHADS2 values include Age > 69 years old.  The start date was 04/23/2005.  Her last INR was 1.5.  Anticoagulation responsible provider: Amardeep Beckers MD, Fayrene Fearing.  INR POC: 1.6.  Cuvette Lot#: [id number].  Exp: 05/2011.    Anticoagulation Management Assessment/Plan:      [name] current anticoagulation dose is Coumadin 2.5 mg tabs: Use as directed by anticoagulation clinic.  (DAW).  The target INR is 1.5 - 2.  The next INR is due 08/14/2010.  Anticoagulation instructions were given to patient.  Results were reviewed/authorized by Weston Brass, PharmD.  She was notified by Margot Chimes PharmD Candidate.         Prior Anticoagulation Instructions: INR 1.7  Continue taking 2 tablets (5mg ) everday except take 1 tablet (2.5 mg) on Sundays, Tuesdays, Thursdays. Recheck INR in 4 weeks.   Current Anticoagulation Instructions: INR 1.6  Continue to take 2 tablets daily except on Sundays, Tuesdays, and Thursdays when you only take 1 tablet.  Recheck INR in 4 weeks.

## 2010-08-08 MED ORDER — CYANOCOBALAMIN 500 MCG/0.1ML NA SOLN: NASAL | Status: DC

## 2010-08-08 NOTE — Telephone Encounter (Signed)
Last B12 level was done by Dr. Kinnie Scales on 04/03/10 results were 647.

## 2010-08-14 ENCOUNTER — Ambulatory Visit (INDEPENDENT_AMBULATORY_CARE_PROVIDER_SITE_OTHER): Payer: BC Managed Care – PPO | Admitting: *Deleted

## 2010-08-14 DIAGNOSIS — Z7901 Long term (current) use of anticoagulants: Secondary | ICD-10-CM

## 2010-08-14 DIAGNOSIS — I2699 Other pulmonary embolism without acute cor pulmonale: Secondary | ICD-10-CM

## 2010-08-14 DIAGNOSIS — R760 Raised antibody titer: Secondary | ICD-10-CM

## 2010-08-14 LAB — POCT INR: INR: 1.5

## 2010-08-14 MED ORDER — WARFARIN SODIUM 2.5 MG PO TABS: 2.5000 mg | ORAL_TABLET | ORAL | Status: DC

## 2010-08-14 NOTE — Patient Instructions (Signed)
INR 1.5  Coumadin 2.5mg  tab Take 2 tab MON, WED, FRI, SAT Take 1 tab TUE, THUR, SUN

## 2010-09-06 ENCOUNTER — Other Ambulatory Visit: Payer: Self-pay | Admitting: Internal Medicine

## 2010-09-11 ENCOUNTER — Ambulatory Visit (INDEPENDENT_AMBULATORY_CARE_PROVIDER_SITE_OTHER): Payer: BC Managed Care – PPO | Admitting: *Deleted

## 2010-09-11 ENCOUNTER — Ambulatory Visit (INDEPENDENT_AMBULATORY_CARE_PROVIDER_SITE_OTHER): Payer: BC Managed Care – PPO | Admitting: Psychology

## 2010-09-11 DIAGNOSIS — I2699 Other pulmonary embolism without acute cor pulmonale: Secondary | ICD-10-CM

## 2010-09-11 DIAGNOSIS — R760 Raised antibody titer: Secondary | ICD-10-CM

## 2010-09-11 DIAGNOSIS — Z7901 Long term (current) use of anticoagulants: Secondary | ICD-10-CM

## 2010-09-11 DIAGNOSIS — F411 Generalized anxiety disorder: Secondary | ICD-10-CM

## 2010-09-19 ENCOUNTER — Ambulatory Visit (INDEPENDENT_AMBULATORY_CARE_PROVIDER_SITE_OTHER): Payer: BC Managed Care – PPO | Admitting: Psychology

## 2010-09-19 DIAGNOSIS — F411 Generalized anxiety disorder: Secondary | ICD-10-CM

## 2010-09-26 ENCOUNTER — Ambulatory Visit (INDEPENDENT_AMBULATORY_CARE_PROVIDER_SITE_OTHER): Payer: BC Managed Care – PPO | Admitting: Psychology

## 2010-09-26 DIAGNOSIS — F411 Generalized anxiety disorder: Secondary | ICD-10-CM

## 2010-10-01 NOTE — Assessment & Plan Note (Signed)
[name]                               LIPID CLINIC NOTE   [name]                   MRN:          [id number]  DATE:03/09/2007                            DOB:          Jun 25, 1960    I have received telephone follow-up regarding [name]  over the past 72 hours.  Briefly [name] is a patient who in  December 2006 developed a pulmonary embolism and was subsequently  treated with Coumadin therapy for approximately 18 months.  She was  evaluated off anticoagulant therapy by Dr. Johnna Acosta at [facility],  who obtained D-dimer concentrations and as they were appropriate,  suggested [name] remain off her anticoagulant therapy.  I do not  have all the details of Dr. Shirlee More workup.  At the time prior to this  evaluation the patient was having heavy menstrual bleeding and was taken  her Coumadin by her primary care physician, Dr. Leslee Home in advance  of her first hematologic workup.   In the past several weeks [name] relates that Dr. 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  It was  [name]'s understanding that Dr. Dalene Carrow suggested that she restart  her Warfarin therapy, a discussion that I have confirmed via telephone  with Dr. Dalene Carrow on Friday, 17 October, 2008.  I spoke with Dr. Leslee Home on March 08, 2007 at 3:35 in the afternoon, at which time Dr.  Lorenz Coaster stated that he had reviewed the workups of the various  hematologists and that it was his recommendation that [name] also  begin on Warfarin pending the evaluation of the third hematologist, Dr.  Marney Doctor (that appointment is scheduled for February 2009).  Dr. Lorenz Coaster  requested that we resume Coumadin follow-up here at [facility]  as we have a clinic organization through which to manage that.  Dr.  Lorenz Coaster stated that  he could follow it up but it was the patient's  preference as well as his that if possible we extend services to Ms.  [name].   I have discussed these details at length with Dr. Diona Browner and based on  these recommendations and the approval of Dr. Diona Browner we will resume  follow-up of [name]'s anticoagulation clinic follow-up and  anticoagulation therapy.  Duration of therapy will be dictated by Ms.  [name]'s hematologist and consultation with Dr. Lorenz Coaster and any  complications associated with this will be triaged to these individuals  for [name]'s plan of care.  I have communicated all of this to Ms.  [name] on the telephone March 08, 2007 at 5:35 p.m.  [name]  understands she will resume 5 mg of Coumadin on Mondays and Wednesdays.  She will come in and she will take 2.5 mg on the other days.  She  requests follow-up on this  Wednesday, October 22, at 11:15.  She will call with questions of  problems in the meantime.  Time spent with providing this care to the  patient  is 2 hours 15 minutes.      Shelby Dubin, PharmD, BCPS, CPP  Electronically Signed      Jonelle Sidle, MD  Electronically Signed   MP/MedQ  DD: 03/09/2007  DT: 03/09/2007  Job #: [id number]   cc:   Jonelle Sidle, MD  Reuben Likes, M.D.  Johnna Acosta, M.D.  Lauretta I. Odogwu, M.D.  Cloretta Ned, M.D.

## 2010-10-01 NOTE — Assessment & Plan Note (Signed)
[facility]                            CARDIOLOGY OFFICE NOTE   KAMIL, HANIGAN                   MRN:          [id number]  DATE:04/06/2008                            DOB:          06-04-1960    [name] returns today for followup of her chest discomfort.  She  saw Dr. Earney Hamburg in consultation on March 22, 2008.   He outlines extensively her past medical history appears specific, she  has a history of pulmonary embolus.  However, she is on Coumadin.  This  was followed by the Hematology service.  She had some sharp stabbing  pains in her chest.  She is also some palpitations early in the morning.  Previous evaluation of the palpitations showed no arrhythmias.   Dr. Clifton James obtained a stress echo.  This showed excellent exercise  tolerance of 10 minutes and 21 seconds.  MET level achieved at 12.3.  She had a normal blood pressure in response to exercise.  Her maximum  heart was 157 which is 91% predicted maximum heart rate.  There were no  ST-segment changes.  She had fatigue.   Her stress echo was completely normal with normal left ventricular  function and no echocardiographic evidence of ischemia.   She is only on Coumadin.  No other cardiac meds.   She has a whole host of questions today all of which were answered.  We  discussed this for at least 20 minutes.   Her blood pressure 106/70, pulse 60 and regular.  She is in no acute  distress.  Respiratory exam is unchanged.   I am delighted that we can give Ms. Wrench a good news.  I have  reassured that things were fine with her heart.  We will plan on seeing  her back in a year p.r.n.     Thomas C. Daleen Squibb, MD, [facility]  Electronically Signed    TCW/MedQ  DD: 04/06/2008  DT: 04/07/2008  Job #: [id number]

## 2010-10-01 NOTE — Assessment & Plan Note (Signed)
[facility]                            CARDIOLOGY OFFICE NOTE   ANNTIONETTE, MADKINS                   MRN:          [id number]  DATE:01/04/2007                            DOB:          [birth year]    PRIMARY CARE PHYSICIAN:  Reuben Likes, M.D.   REASON FOR VISIT:  Follow up on cardiac monitor, with history of  palpitations and dizziness.   HISTORY OF PRESENT ILLNESS:  [name] was just recently seen in the  office by Dr. Riley Kill as an add-on to the Clinic.  She presented at that  time complaining of a feeling of generally elevated heart rate as well  as intermittent dizziness.  Her prior history included thromboembolic  disease, and previous pulmonary emboli treated with Coumadin; although,  recently this was discontinued under the direction of Dr. Isaiah Serge in the  Hematology Division at [facility].  We reviewed his note of April  2008, with plan at that time for a follow-up D-dimer level.  Dr. Riley Kill  elected to check a D-dimer level at the last visit; and this was  minimally elevated at 0.53 (upper normal 0.48).  This resulted in a CT  scan of the chest with contrast to rule out a recurrent embolus; this  study was negative for any filling defects in the opacified pulmonary  arteries, to suggest acute pulmonary embolus.  Stable pulmonary nodules  are noted, as outlined previously (4-5 mm).  No other acute findings  were described.  This information was discussed with the patient.   She did wear a 24-hr Holter monitor, and this study was reviewed today;  showing sinus rhythm ranging from 47 to 131 beats per minute, with  average being 70 beats per minute.  She provided a diary indicating a  feeling of a little dizziness and lightheadedness, as well as raised  heart rate at times.  It did not necessarily correlate with any  specific rapid heart rate or dysrhythmia.  She was noted to have  occasional premature atrial complexes, and I  discussed this with her  today. She had no sustained arrhythmias, and certainly no ventricular  arrhythmias were noted.  Overall, I tried to reassure her today. She has  had a prior reassuring cardiac evaluation, with previously documented  normal left ventricular systolic function and no clear evidence of  ischemia.  She tells me parenthetically that she has been losing weight,  and also has to drink a fair bit of fluid.  It may be that she is having  some general orthostasis, although she has not been particularly  hypotensive on examinations.  She was also wondering if she was going  through perimenopausal symptoms.   ALLERGIES:  POSSIBLY ASPIRIN INTOLERANCE.  Also, question of allergy  with thickness of tongue and sore throat the day after receiving  OMNIPAQUE for her CT scan of the chest.  It is not entirely clear to me  that this was an allergy, but it might deserve pre-treatment in the  future if she is ever to have a repeat scan.   MEDICATIONS:  Xanax 0.25 mg p.o. q.h.s.  REVIEW OF SYSTEMS:  As described in the history of present illness.   EXAMINATION:  Blood pressure today 108/70, heart rate 64, weight 149  pounds.  The patient is comfortable, in no acute distress.  NECK:  No elevated jugular venous pressure.  LUNGS:  Clear.  Nonlabored breathing.  CARDIAC:  Exam regular rate and rhythm without murmurs, rubs or gallops.  EXTREMITIES:  Show no edema.   IMPRESSION:  1. Symptoms of palpitations and general dizziness.  These are      episodic, sometimes in the morning, but not associated with frank      syncope or chest pain.  Recent 24-hr Holter monitor demonstrated      sinus rhythm with occasional premature atrial complexes; but no      other sustained arrhythmias.  It is not entirely clear to me that      this has any particular bearing on her symptoms at this time.      Although, she may be feeling her premature atrial complexes.  At      this point I do not think  initiating medical therapy for this is      necessarily required, and she is of this mind as well.  She may be      having some increase in ectopy, with perimenopausal symptoms.  She      had previously documented left ventricular function, with no      ischemia.  Also of note, she had a recent CT scan of the chest that      did not demonstrate any new thromboembolic disease.  My basic plan      today was of reassurance and for her to continue to observe for any      significant changes.  She will plan to continue to follow up with      Drs. Wyline Mood, and we can see her back as needed.  At this      point I do not anticipate any additional cardiac testing.  2. Followup will be p.r.n.     Jonelle Sidle, MD  Electronically Signed    SGM/MedQ  DD: 01/04/2007  DT: 01/05/2007  Job #: [id number]   cc:   Reuben Likes, M.D.  [facility] Dr. Alanson Aly. [employer]

## 2010-10-01 NOTE — Assessment & Plan Note (Signed)
[facility]                            CARDIOLOGY OFFICE NOTE   JASMINE, MCBETH                   MRN:          [id number]  DATE:10/21/2007                            DOB:          July 27, 1960    PRIMARY CARE PHYSICIAN:  Dr. Leslee Home.   REASON FOR VISIT:  Patient scheduled visit with questions.   HISTORY OF PRESENT ILLNESS:  Ms. Brinkmeier called the office to schedule  a visit.  She mainly had questions regarding her lipid status.  She had  some blood work done recently including a high sensitivity C-reactive  protein done through [facility] which was elevated at 0.87 back in  February.  She has also had lipids obtained showing a total cholesterol  of 274, HDL of 75 and an LDL of 172.  She states that she is already a  vegetarian and has cut back her diet even further, concentrating on the  carbohydrates.  She has lost approximately 5 pounds and was wondering  whether she might need medical therapy to address this.  We did talk  about statin medications today and their benefit in both addressing anti-  inflammatory issues and LDL cholesterol.  She would be a candidate to  consider this if dietary measures are not effective.  Optimally her LDL  should be less than 100, at least under 130 as she has no other major  risk factors.  She otherwise continues on chronic Coumadin under the  direction of hematology with a previous history of pulmonary embolus.  She is not bothered by any major palpitations at this time and her  electrocardiogram shows sinus bradycardia at 51 beats per minute.   ALLERGIES:  Possibly ASPIRIN.   CURRENT MEDICATIONS:  1. Xanax 0.25 mg p.o. nightly.  2. Coumadin and decreased stasis.  3. Restasis.  4. She uses Imitrex p.r.n.   REVIEW OF SYSTEMS:  As described in the history of present illness.  Otherwise, negative.   EXAMINATION:  Blood pressure is 108/73, heart rate is 51 weight is 174  pounds, down from  153 pounds.  The patient is in no acute distress.  Examination of the neck reveals no carotid bruits.  No elevated jugular  venous pressure no lymphadenopathy and no thyromegaly.  Lungs are clear without labored breathing.  CARDIAC:  Exam reveals a regular rate and rhythm.  No loud murmur or  gallop.  EXTREMITIES:  No pitting edema.   IMPRESSION/RECOMMENDATIONS:  Hyperlipidemia with documentation of  elevated high-sensitivity C-reactive protein.  I do think that statin  therapy would be considered in her case, particularly if she is not able  to improve these numbers with modifications in diet.  She is already  eating reportedly a vegetarian diet, however.  We talked about the  possibility using a low-dose statin medication such as Crestor 10 mg  daily.  She states that she is working on her diet over the next few  months.  She will plan to follow up Dr. Lorenz Coaster to have a repeat lipid  profile.  As I said above, I think an optimal LDL for her would be under  100, at least under 130.  Her HDL was elevated and may well be  protective.  One can always consider the possibility of a Lipomed  profile for particle size definition.  Otherwise, I do not anticipate  any further cardiac testing at this point.     Jonelle Sidle, MD  Electronically Signed    SGM/MedQ  DD: 10/21/2007  DT: 10/21/2007  Job #: [id number]   cc:   Reuben Likes, M.D.

## 2010-10-01 NOTE — Assessment & Plan Note (Signed)
[facility]                            CARDIOLOGY OFFICE NOTE   EVOLEHT, HOVATTER                   MRN:          [id number]  DATE:04/20/2007                            DOB:          [birth year]    PRIMARY CARE PHYSICIAN:  Reuben Likes, M.D.   GYNECOLOGIST:  Artist Pais, M.D., [facility].   REASON FOR VISIT:  Follow-up cardiac monitoring.   HISTORY OF PRESENT ILLNESS:  I saw Ms. See in mid November.  She  presented at that time describing the sense of rapid palpitations and  was noted to have both regular heart rate on examination and by  electrocardiography at the time of symptoms.  I reassured her but we  also provided a 10-day event recorder to exclude objectively any  recurrent dysrhythmias.  The recorded strips were all very reassuring  demonstrating sinus rhythm with a single premature atrial complex and no  sustained arrhythmias.  I reviewed these with the patient today.  She  states that she actually has felt better over the last few weeks and  that she noticed this following her menstrual period.  There is still  some concern about her sense of palpitations possibly being related to  her menstrual cycle.  She spoke with her gynecologist about this and for  the time being, she will simply observe symptoms.  I do not think we  need to proceed with any further cardiac evaluation at this time.   ALLERGIES:  Intolerance to ASPIRIN.   PRESENT MEDICATIONS:  1. Xanax 0.25 mg p.o. nightly.  2. Coumadin as directed by the Coumadin clinic.   REVIEW OF SYSTEMS:  As described in history of present illness.   PHYSICAL EXAMINATION:  VITAL SIGNS:  Blood pressure 121/78, heart rate  58, weight 153 pounds.  GENERAL APPEARANCE:  Patient is comfortable, in no acute distress.  NECK:  Examination reveals no elevated jugular venous pressure, no loud  bruits.  LUNGS:  Clear without labored breathing.  CARDIOVASCULAR:  Regular rate and  rhythm, no loud murmur or gallop.  EXTREMITIES:  No pitting edema.   IMPRESSION/RECOMMENDATIONS:  1. Recent palpitations, presently resolved.  Event recorder was      reassuring showing sinus rhythm with a single premature atrial      complex and no sustained arrhythmias.  No further cardiac      evaluation is planned.  It may well be that she is experiencing      some symptoms in association with her menstrual cycle.  One could      consider either a low dose beta-blocker or low dose clonidine if      her symptoms worsen.  She would likely need to avoid HRT with her      history of PE.  She plans to discuss this further with Dr. Thomasena Edis      if her symptoms persist or      progress.  2. Otherwise continue to follow up with Dr. Lorenz Coaster and hematology.     Jonelle Sidle, MD  Electronically Signed    SGM/MedQ  DD: 04/20/2007  DT: 04/20/2007  Job #: [id number]   cc:   Reuben Likes, M.D.  Artist Pais, M.D.

## 2010-10-01 NOTE — Assessment & Plan Note (Signed)
[facility]                            CARDIOLOGY OFFICE NOTE   KASONDRA, JUNOD                   MRN:          [id number]  DATE:03/22/2008                            DOB:          1960/07/09    PRIMARY CARE PHYSICIAN:  Reuben Likes, MD.   REASON FOR VISIT:  Unscheduled visit with complaints of chest pain.   HISTORY OF PRESENT ILLNESS:  [name] is a pleasant 50 year old  [place] female who was previously followed this office by Dr.  Nona Dell with plans to establish care with Dr. Juanito Doom in  several weeks and comes in today with complaints of substernal chest  discomfort.  The patient has a history of pulmonary embolism and  borderline hyperlipidemia.  She had complained to Dr. Diona Browner 2 years  ago of an awareness of palpitations.  A 48-hour Holter monitor showed no  arrhythmias.  The stress echocardiogram was performed at that time and  showed good exercise tolerance with the patient exercising for 10  minutes and having no EKG evidence of ischemia or echocardiographic  evidence of ischemia.  [name] wall motion with exercise was  normal.  She phoned into our office early this morning and schedule an  appointment to be seen by me today.  She tells me that for the last  several weeks she has been having episodes of sharp stabbing chest pains  that last for 2-3 seconds and occur in the upper part of her chest.  Approximately 2 weeks ago, she began having some upper back pain.  The  back pain worsened 3 days ago and was continuous for several days.  Her  back pain went away yesterday, however, she then had the onset of  substernal chest pressure and heaviness.  This was not associated with  any awareness of palpitations, however, she did have a mild shortness of  breath with this chest heaviness.  When she awoke this morning, she was  no longer having any chest pain, but does have a residual upper back  pain.  The  patient tells me that she has been seen by multiple  physicians for multiple complaints including pain in her right ear and  right side of her throat.  It appears that she has been seen by an Ear,  Nose, and Throat surgeon and recently had tubes placed in that ear.  She  has also apparently been seen by a rheumatologist, who suspects that she  may have rheumatoid arthritis, however, I have no confirmation of this  with currently available medical records.  The patient does have a  history of a mildly elevated C-reactive protein in the past.  She has  been managed on long-term Coumadin for a pulmonary embolism that  occurred several years ago.  She is followed by hematology for this  issue.   She presents today with complaints of the chest pressure as described  above.  There are no associated palpitations, dizziness,  lightheadedness, near-syncope, or syncope.  She also denies any  orthopnea, PND, or lower extremity edema.   PAST MEDICAL HISTORY:  1. Significant for  a pulmonary embolism diagnosed in December 2006 at      [facility].  2. Borderline hyperlipidemia with most recent fasting lipid profile      showing a total cholesterol of 220, triglycerides 74, HDL      cholesterol 82, and LDL 97.  3. Chronic ear problems and throat pain.  4. Questionable rheumatoid arthritis.   PAST SURGICAL HISTORY:  Placement of tubes in the right ear.   ALLERGIES AND INTOLERANCE:  To ASPIRIN and apparent allergic reaction to  CONTRAST DYE with her tongue swelling after several CT scans.   CURRENT MEDICATIONS:  1. Xanax 0.25 mg once daily.  2. Coumadin as directed.  3. Restasis eye drops twice daily.  4. Vitamin D once daily.  5. Iron once daily.   SOCIAL HISTORY:  The patient denies use of tobacco, alcohol, or illicit  drugs.  She is married and has 1 son, who is 38 years old.  She lives in  [place] and is unemployed.   FAMILY HISTORY:  [name] mother died from lymphoma.   Her father  died from dementia.  She is not aware of any family history of premature  coronary artery disease, although most of her family lives in the [place].   REVIEW OF SYSTEMS:  As stated in the history present illness and is  otherwise negative.   PHYSICAL EXAMINATION:  VITAL SIGNS:  Blood pressure 114/68, pulse 53 and  regular, respirations 12 and unlabored.  GENERAL:  She is a pleasant middle-aged [place] female in no  acute distress.  She is alert and oriented x3.  NECK:  No JVD.  No carotid bruits.  No lymphadenopathy.  No thyromegaly.  SKIN:  Warm and dry.  LUNGS:  Clear to auscultation bilaterally without wheezes, rhonchi, or  crackles noted.  CARDIOVASCULAR:  Bradycardic with regular rhythm and no  murmurs, gallops or rubs noted.  ABDOMEN:  Soft and nontender.  Bowel sounds are present.  EXTREMITIES:  No evidence of edema.  Pulses are 2+ in all extremities.   DIAGNOSTIC STUDIES:  1. A 12-lead EKG shows sinus bradycardia with a ventricular rate of 53      beats per minute and nonspecific ST-segment changes.   ASSESSMENT AND PLAN:  This is a pleasant 50 year old Caucasian female  who presents today with complaints of substernal chest pressure that has  been present for several weeks occurring on an on and off basis.  However, it was much more severe last night prompting the patient call  our office to be seen today.  This pain had association of mild  shortness of breath, but no palpitations, diaphoresis, nausea, or  dizziness.  I feel that anxiety may be a big component of her chest  discomfort, however, I have discussed the possibility of occlusive  coronary artery disease with the patient.  We had elected to proceed  with stress echocardiogram.  We will plan on having her keep her regular  schedule appointment with Dr. Juanito Doom in several weeks to review the  results of her stress echocardiogram.  I will make no  medication changes at the current time.   The patient is aware that she  should call our office, if she has any change in her symptoms in the  meantime.     Verne Carrow, MD  Electronically Signed    CM/MedQ  DD: 03/22/2008  DT: 03/23/2008  Job #: [id number]   cc:   Reuben Likes, M.D.

## 2010-10-01 NOTE — Assessment & Plan Note (Signed)
[facility]                            CARDIOLOGY OFFICE NOTE   [name], [name]                   MRN:          [id number]  DATE:03/30/2007                            DOB:          1960/10/16    PRIMARY CARE PHYSICIAN:  Reuben Likes, M.D.   REASON FOR VISIT:  Recurrent palpitations.   HISTORY OF PRESENT ILLNESS:  I saw [name] back in August.  She was  experiencing some palpitations at that time and had no frank  dysrhythmias noted on Holter monitoring for 24 hours; although she did  have some occasional premature atrial complexes.  It was not entirely  clear to me that these were symptom-provoking, and we discussed simple  observation at that point.  She states that she gradually had resolution  of these symptoms, and then over the last two weeks has had a recurrent  sense that her heart was racing, actually reporting that it feels like  it is racing today in the office.  It is interesting to note that her  electrocardiogram done at the same time that she is describing symptoms,  shows normal sinus rhythm at 65 beats per minute, otherwise no acute  changes.  I discussed this with her today.  She is very anxious about  her health status in general and thinks that maybe she is undergoing  some hormonal changes, as she has had more irregularity in her  menstrual cycle.  She reports having a recent pregnancy test that was  negative.  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 Isaiah Serge, [facility], and also Dr. Dalene Carrow here in town.  My understanding is that Coumadin has been recommended for the time  being, although she has yet a third hematology consultation coming up in  February.  She otherwise denies any cough, hemoptysis, chest pain or  breathlessness.   ALLERGIES:  INTOLERANCE TO ASPIRIN.   MEDICATIONS:  1. Xanax 3.25 mg p.o. q.d.  2. Coumadin as directed by the Coumadin clinic and under the  direction      of Dr. Lorenz Coaster and Dr. Dalene Carrow.  We are not guiding either the      indication for or duration of this medication.   REVIEW OF SYSTEMS:  As described in history of present illness.   EXAMINATION:  VITAL SIGNS:  Blood pressure is 131/80, heart rate is 73,  weight is 154 pounds.  GENERAL:  The patient is in no acute distress.  HEENT:  Conjunctivae is normal.  Oropharynx is clear.  NECK:  Supple.  No elevated jugular venous pressure.  LUNGS:  Clear without any work of breathing.  CARDIAC:  Reveals a regular rate and rhythm and no ectopic beats, no  loud murmur, no pericardial rub, no S3 gallop.  EXTREMITIES:  Show no pitting edema.  Distal pulses are 2+.  ABDOMEN:  Soft, nontender.  SKIN:  Warm and dry.  MUSCULOSKELETAL:  No kyphosis.  NEUROPSYCHIATRIC:  The patient is alert, oriented x3.  She is anxious in  discussing her health status.   IMPRESSION/RECOMMENDATION:  1. Recurrent palpitations.  I am a bit perplexed with her symptom      description.  She is describing a feeling of racing heart rate at      the present time, although her examination and her      electrocardiogram show normal sinus rhythm in the 60s, without any      ectopy.  I have explained this to her and tried to reassure her.      She states that she feels these symptoms at night and sometimes      wakes up with rapid palpitations.  Since she only wore a Holter      monitor for 24 hours, and her symptoms have recurred and seem to be      more prolonged, I will provide and event recorder for 10 days.  If      this is reassuring, I would not suggest any further cardiac workup      of this complaint.  It is possible she is experiencing some      perimenopausal symptoms, and perhaps a medication such as Clonidine      might be in order, or a low-dose beta blocker.  I asked her to see      her gynecologist in the interim.  We will plan to see her back in      the office over the next few weeks and discuss  her event recorder.  2. Further plans to follow.     Jonelle Sidle, MD  Electronically Signed    SGM/MedQ  DD: 03/30/2007  DT: 03/31/2007  Job #: [id number]   cc:   Reuben Likes, M.D.

## 2010-10-01 NOTE — Assessment & Plan Note (Signed)
[facility]                            CARDIOLOGY OFFICE NOTE   TAVIA, STAVE                   MRN:          [id number]  DATE:12/25/2006                            DOB:          1960/07/18    Ms. Sardinha comes in as an add-on today.  The patient has been on  chronic Coumadin anticoagulation for pulmonary emboli.  She has had a  workup in [place].  She tells me that she had an elevated IgG, but  that Dr. Isaiah Serge felt that she could come off anticoagulation three weeks  ago.  She has also had an endometrial biopsy last week, and also is  scheduled to have colonoscopy next week, so she was really quite  concerned.  The patient has been anxious since she found out about  having to have the colonoscopy.  This has gotten gradually worse over  the past couple of days.  Of note, she has had an exercise  echocardiogram done in the past, which has been unremarkable.  She has a  mildly calcified aortic valve and a mild aortic root dilatation.  She  has not had any tearing discomfort with the pain; it has just been a  slight heaviness in the midepigastrium.  She has not been particularly  short of breath.  She had previously had bilateral lower lobe pulmonary  emboli by outpatient CT of the chest in November of 2006, and has been  followed in the Coumadin clinic by Dr. Diona Browner.   She says that the other problem has been that her heart has been racing,  although when she walked in today for an EKG, her heart rate was 56 and  she still feels like she is having palpitations and racing heart.   CURRENT MEDICATIONS INCLUDE:  Xanax and she has stopped Zyrtec.  She is  no longer taking warfarin anticoagulation.   PHYSICAL EXAMINATION:  She is alert and oriented.  She is not short of  breath.  Her blood pressure is 96/78 and her pulse is 58.  The lung fields are clear to auscultation and percussion.  The cardiac rhythm is regular without a definite  murmur noted.  Pulses  are equal bilaterally, and there is no evidence of any type of  discrepancy.  There is no obvious lower extremity edema or finding  suggestive of DVT.   Electrocardiogram demonstrates sinus bradycardia with sinus arrhythmia  with possible left atrial enlargement and is a borderline study.   Her O2 saturation is 99%.   She was scheduled to go back to [facility] for a D-dimer.  We will go  ahead and get this today and get a basic metabolic profile.  I will also  schedule her for a Holter monitor.  If her D-dimer were elevated, we  might well consider going ahead with a repeat CT scan.  She does have  pulmonary nodules noted.  I will get her in to see Dr. Diona Browner early  next week for followup.  I doubt that she probably has significant  coronary disease or that these likely represent coronary symptoms and,  at the  present time, the clinical suspicion of current pulmonary emboli  is fairly low, with the normal heart rate, normal O2 saturation, no  evidence of DVT, and no typical symptoms of PE.  These are clearly  different than what she had previously.  Also, the previous PE occurred  on estrogen therapy, which she is no longer on.   We will go ahead and get these laboratory studies, Holter monitor, and  if she has increasing problems over the weekend, she is to call the  service.     Arturo Morton. Riley Kill, MD, [facility]  Electronically Signed    TDS/MedQ  DD: 12/25/2006  DT: 12/25/2006  Job #: [id number]   cc:   Jonelle Sidle, MD  [facility] Dr. Cherie Dark, [facility]

## 2010-10-04 NOTE — Assessment & Plan Note (Signed)
[facility]                            CARDIOLOGY OFFICE NOTE   ROISE, EMERT                   MRN:          [id number]  DATE:07/20/2006                            DOB:          10/27/1960    PRIMARY CARDIOLOGIST:  Jonelle Sidle, MD   PRIMARY CARE PHYSICIAN:  Reuben Likes, MD   HISTORY OF PRESENT ILLNESS:  Ms. Nand is a 50 year old woman who has  had pulmonary emboli in the past.  She remains on chronic Coumadin  anticoagulation.  Dr. Diona Browner has previously seen her for palpitations.  Two weeks ago her father died at the age of 59.  She was the primary  caregiver.  Beginning last Friday she says she has had fairly frequent  tachypalpitations including some awakening her at night.  She has not  had any associated chest discomfort or dyspnea.  She has had no  presyncope or syncope.  She says she is having the same symptom as I  examine her.  While having the symptom, she is in normal sinus rhythm at  a rate of 96 beats per minute.  She is also concerned that she has  missed a period though she has had a negative pregnancy test.  She is to  see her gynecologist this afternoon.   CURRENT MEDICATIONS:  Her current medications are Coumadin as followed  in our Coumadin clinic, Xanax 0.25 mg q.h.s. and Zyrtec 10 mg per day.   PHYSICAL EXAMINATION:  She is generally well appearing in no distress.  The heart rate 76, blood pressure 100/70 and weight of 150 pounds.  She  has no jugular venous distention, thyromegaly, or lymphadenopathy.  Lungs are clear to auscultation.  Respiratory effort is normal.  She has  a nondisplaced point of maximal cardiac impulse.  There is a regular  rate and rhythm without murmur, rub, or gallop.  The abdomen is soft,  nondistended, nontender.  There is no hepatosplenomegaly.  Bowel sounds  are normal.  Extremities are warm without clubbing, cyanosis, edema, or  ulceration.   ELECTROCARDIOGRAM:   Demonstrates normal sinus rhythm and is a normal  EKG.   IMPRESSION/RECOMMENDATION:  Palpitations.  The patient complains of  ongoing palpitations despite being in normal sinus rhythm at present.  I  have taught her to check her pulse and asked her to document her pulse  rate at any time she has more pronounced palpitations, particularly at  night.  I have scheduled a followup with Dr. Diona Browner in 3 weeks' time.  I think this most likely relates to grief response after losing her dad.  She tells me she thinks this may well be the case.     Salvadore Farber, MD  Electronically Signed    WED/MedQ  DD: 07/20/2006  DT: 07/20/2006  Job #: [id number]

## 2010-10-04 NOTE — Assessment & Plan Note (Signed)
[facility]                              CARDIOLOGY OFFICE NOTE   [name], [name]                   MRN:          [id number]  DATE:02/25/2006                            DOB:          December 28, 1960    REASON FOR VISIT:  Palpitations and neck discomfort.   HISTORY OF PRESENT ILLNESS:  I last saw the patient back in December, 2006.  Her history includes bilateral lower lobe pulmonary emboli and no clear  cardiovascular disease.  She had an echocardiogram following our last visit,  demonstrating normal left ventricular systolic function with mild aortic  root dilatation, mildly calcified aortic valve, and overall normal right  ventricular size and function.  She scheduled a visit today, given a feeling  of palpitations describing a rapid heart rate, typically in the evenings  intermittently.  She seems to relate this to the medications Paxil and  Lexapro, although she has experienced it to a lesser degree at other times.  She also had a recent feeling of presyncope on Saturday when she was under a  lot of stress, visiting a nursing home with her father and also in the heat  of the day.  Orthostatics in the office today were unremarkable, and the  patient's electrocardiogram was normal, showing sinus rhythm at 60 beats per  minute.  She tells me that she completed six months of Coumadin following  her pulmonary emboli but has been restarted on this medicine due to some  documented clotting abnormalities.  She brought me copies of reports  indicating a mildly increased factor VIII, increased C4, increased C-  reactive protein (7.6), normal erythrocyte sedimentation rate, and an  increased anticardiolipin antibody.  I am not certain if she is going to be  continued on Coumadin long term or not.  She also brought report of a follow-  up CT scan of the chest ordered by Dr. Lorenz Coaster and performed in June of this  year.  This revealed two stable  noncalcified nodules in the right upper lobe  with some left basilar linear atelectasis, some scarring versus slight  dependent edema and slight diffuse fatty infiltration of the liver,  otherwise showing no active pulmonary embolus.   Symptomatically, [name] has experienced some neck and ear discomfort.  This seems to be somewhat sporadic, although she does report it worsening  sometimes after she does activity.  She has been using prednisone p.r.n. for  possible inflammation and it sounds as if she may have even seen an  otolaryngologist, based on her description.   ALLERGIES:  Possibly ASPIRIN.   CURRENT MEDICATIONS:  1. Coumadin.  2. Xanax 0.25 mg p.o. nightly.  3. Zyrtec 10 mg p.o. daily.  4. Astelin nasal spray.  5. Imitrex p.r.n.  6. Prednisone p.r.n.   REVIEW OF SYSTEMS:  As described in the history of present illness.  All  others negative.   PHYSICAL EXAMINATION:  VITAL SIGNS:  Blood pressure 114/74, heart rate 60.  Weight is 150 pounds.  GENERAL:  Patient is comfortable.  Not short of breath at rest.  Denying any  active chest pain or  palpitations.  NECK:  Examination of the neck reveals no elevated jugular venous pressure  or loud bruits.  No thyromegaly is noted.  LUNGS:  Clear with nonlabored breathing at rest.  HEART:  Regular rate and rhythm without S3 gallop or pericardial rub.  There  is no loud murmur.  EXTREMITIES:  No significant pitting edema.  Distal pulses are 2+.  SKIN:  Warm and dry.  Capillary refill is normal.   IMPRESSION/RECOMMENDATIONS:  1. Etiology of the neck discomfort is not certain.  She does relate,      perhaps some exertional component, and it is certainly reasonable to      screen her for underlying ischemic heart disease with an element of      hypercoagulability being documented.  Previous assessment of her      ejection fraction is normal.  Plan an exercise echocardiogram for risk      stratification, and I will have her  return to the office to discuss      this further.  2. Palpitations:  As outlined above.  It sounds as if some of this was      potentially medication-related.  I have asked her to observe this and      if symptoms progress, we can always provide an event recorder.  Resting      electrocardiograms have been normal with normal intervals and no other      arrhythmias have been otherwise documented.  I suspect some of this      could be due to a neurocardiogenic component, and in fact, her episode      of presyncope recently sounds very consistent with this.  3. Further plans to follow.       Jonelle Sidle, MD     SGM/MedQ  DD:  02/25/2006  DT:  02/26/2006  Job #:  [id number]   cc:   Reuben Likes, M.D.  Areatha Keas, M.D.  Lauretta I. Odogwu, M.D.

## 2010-10-04 NOTE — Discharge Summary (Signed)
[name], [name]          ACCOUNT NO.:  [id number]   MEDICAL RECORD NO.:  [id number]          PATIENT TYPE:  INP   LOCATION:  4728                         FACILITY:  [facility]   PHYSICIAN:  Mobolaji B. Bakare, M.D.DATE OF BIRTH:  August 13, 1960   DATE OF ADMISSION:  04/17/2005  DATE OF DISCHARGE:  04/23/2005                                 DISCHARGE SUMMARY   Primary care physician:  Dr. Leslee Home.   FINAL DIAGNOSIS:  1.  Bilateral pulmonary embolism.  2.  Pleuritic chest pain.  3.  Migraine headaches.  4.  Leukocytosis secondary to bilateral pulmonary embolism.  5.  A several-millimeter round nodule in the right upper lobe, recommended      follow-up CT in 3 to 6 months.   PROCEDURES:  Chest x-ray done on the 30th of November showed linear scarring  or atelectasis at the right base.  CT angiogram of the chest showed  bilateral lower lobe pulmonary emboli, right upper lobe nodule, non-  calcified and indeterminate, follow-up CT in 6 months suggested.   BRIEF HISTORY:  [name] is a 50 year old lady.  She was referred by Dr.  Lorenz Coaster to the hospital secondary to outpatient diagnosis of pulmonary  embolism.  Apparently, she had been having some neck pain and back ache  which prompted the chest x-ray and CT scan of the chest.  CT scan completed,  revealed bilateral lower lobe pulmonary emboli and she was sent to the  hospital for initiation of treatment.   She had significant pleuritic chest pain, particularly on breathing.  There  was no significant cough or shortness of breath.  She felt tired on  exertion.  There has been no fever.   [name] has been on estradiol transdermal patch and Yasmin (which is  drospirenone and ethinyl estradiol).  She has been using these two  treatments to control migraine headaches which occur prior to her  menstruation.  Uses the patch just before menstruation and uses Yasmin on a  daily basis.  She denies any miscarriages or family  history of pulmonary  emboli.  She is not a smoker.  There has been no recent hospitalization or  surgery.   HOSPITAL COURSE:  Ms. Underwood was started on anticoagulation with Lovenox  and Coumadin.  The  INR  was monitored.  On the 4th of December, INR was  1.9.  Both Coumadin and Lovenox were continued until the 6th of December.  INR at discharge was 2 and PT 23.1.  She would continue with Coumadin, as  detailed below.   Patient was strongly advised to stop both Yasmin and estradiol patch.  Unfortunately, blood specimen was not obtained for hypercoagulable profile  prior to initiation of Lovenox and Coumadin.  She could have a  hypercoagulable profile done after completing therapy for pulmonary  embolism.   Migraine headaches:  Patient's admission coincided with onset of her  menstruation.  She developed migraine headaches and these responded to both  narcotic analgesic and Imitrex.  Migraine subsided within 24 hours.   Pleuritic chest pain resolved with scheduled Darvocet and this was switched  to p.r.n. Darvocet  at the time of discharge.  Patient was able to breathe  deeply without experiencing significant pleuritic chest pain.  She was given  incentive spirometer to help lung expansion and she was advised to continue  with this at home.   She had leukocytosis of 14.2 on admission.  There was no fever.  Chest x-ray  was clear for any acute disease.  This was felt to be secondary to the  pulmonary embolism.  At the time of discharge, white cell count was trending  down at 11.6.   DISCHARGE MEDICATIONS:  1.  Coumadin 7.5 mg on Tuesdays and Thursdays and 5 mg on other days.  2.  Darvocet-N one tablet q.4-6h. p.r.n. for chest pain.  3.  Neurontin 300 mg p.o. q.h.s., as before.  4.  Continue with Imitrex, as before, which she uses p.r.n.   DISCHARGE CONDITION:  Patient was hemodynamically stable, saturating at 97%  on room air, with a pulse rate of 82 beats per minute.  She was  discharged  home with her husband.   FOLLOWUP AND INSTRUCTIONS:  She was instructed to follow up with Dr. Leslee Home in the office on Friday.  Prescription was given for Coumadin and  blood tests to include PT/INR with the results faxed to Dr. Lorenz Coaster and  patient will follow up subsequently for Coumadin dosing.  Repeat CT scan of  the chest is recommended in 3 to 6 months to follow up the several-  millimeter nodule in the lung.      Mobolaji B. Corky Downs, M.D.  Electronically Signed     MBB/MEDQ  D:  04/24/2005  T:  04/25/2005  Job:  [id number]   cc:   Reuben Likes, M.D.  Fax: [phone]

## 2010-10-04 NOTE — Assessment & Plan Note (Signed)
[facility] HEALTHCARE                              CARDIOLOGY OFFICE NOTE   Krystal, [name]                   MRN:          [id number]  DATE:03/17/2006                            DOB:          07-27-1960    REFERRING PHYSICIAN:  Jonelle Sidle, MD   REASON FOR VISIT:  Follow-up stress testing.   HISTORY OF PRESENT ILLNESS:  I saw [name] back on the 10th of this  month.  Her history is detailed in my previous notes.  I referred her for  baseline ischemic testing with an exercise echocardiogram which was read by  Dr. Myrtis Ser and was normal without evidence of ischemia.  The study was normal  both electrocardiographically and echocardiographically.  I relayed this  information to her and reassured her about her cardiac status.  She does  have a hypercoagulable state and apparently an elevated CRP.  We talked  about basic risk factor modification strategies going forward.  She  continues on Coumadin under the direction of Hematology.  At this point, I  am not certain how long she will be on this medicine.  Down the road if she  comes off of Coumadin, it might be reasonable to consider an aspirin as well  as regular follow up of her blood pressure and also her lipid status.  At  this point, I would not necessarily recommend any additional cardiac testing  except to say that she should ultimately have a follow-up resting  echocardiogram in approximately one year to follow up on mild aortic root  dilatation.   PRESENT MEDICATIONS:  1. Coumadin.  2. Xanax 0.25 mg p.o. q.h.s.  3. Zyrtec 10 mg p.o. daily.  4. Imitrex and prednisone listed as p.r.n.   REVIEW OF SYSTEMS:  As described in history of present illness.  She states  that the palpitations have been less frequent.  She has had no dizziness or  syncope.  No obvious bleeding problems.   PHYSICAL EXAMINATION:  Blood pressure 116/72, heart rate 68, weight is 153  pounds.  The patient was not  otherwise reexamined today.   IMPRESSION/RECOMMENDATIONS:  1. Reassuring exercise echocardiogram revealing no evidence of ischemia:      From a cardiac prospective, would recommend basic risk factor      modification.  2. No further cardiac testing anticipated at this particular time.  It      would be reasonable to consider a repeat resting echocardiogram in      about one year to follow up on her previously      described mild aortic root dilatation.  Continue regular follow up with      Dr. Lorenz Coaster.  We can see her as needed.     Jonelle Sidle, MD  Electronically Signed    SGM/MedQ  DD: 03/17/2006  DT: 03/17/2006  Job #: [id number]   cc:   Reuben Likes, M.D.

## 2010-10-04 NOTE — H&P (Signed)
[name], [name] NO.:  [id number]   MEDICAL RECORD NO.:  [id number]          PATIENT TYPE:  INP   LOCATION:  1829                         FACILITY:  [facility]   PHYSICIAN:  Michaelyn Barter, M.D. DATE OF BIRTH:  1960/11/08   DATE OF ADMISSION:  04/17/2005  DATE OF DISCHARGE:                                HISTORY & PHYSICAL   CHIEF COMPLAINT:  Right shoulder pain.   HISTORY OF PRESENT ILLNESS:  [name] is a 50 year old female who states  that yesterday morning she felt a little neck pain shortly after waking up.  Her pain persisted throughout the day, and at approximately 7 o'clock last  night she developed right-sided shoulder pain.  The pain traveled from her  shoulder posteriorly to her waistline.  The pain is described as being sharp  in character.  She states that she has never had similar pain before.  She  took some Tylenol early this morning consisting of two extra-strength  tablets, and became diaphoretic.  She initially thought she was going to  collapse, but she did not collapse.  Later this morning she went to see Dr.  Leslee Home, her primary care physician.  He initially performed a chest x-  ray and believed the patient had a pneumonia.  She was therefore started on  Levaquin.  He went on to perform a CT scan of her chest, and it revealed  bilateral pulmonary emboli.  Therefore, she was sent to the hospital's  emergency room for further evaluation.  She denies having any nausea,  vomiting, fevers or chills, no shortness of breath.  Her shoulder/back pain  reached approximately 5/10 in intensity.  She does complain of some back  pain which is exacerbated by taking deep breaths.  Lying flat aggravates her  pain, sitting up makes the pain better.   PAST MEDICAL HISTORY:  1.  Migraine headaches occur prior to menstruation.  2.  The patient had a vaccination for rabies approximately seven weeks ago      secondary to a fox entering the patient's  house and attacking her      husband; therefore, the entire family received  a vaccination.  Her last      vaccination ended approximately two weeks ago, on April 02, 2005.  3.  Anxiety.  4.  Burning sensation in her head.  5.  Hypertriglyceridemia.  6.  The patient denies having a history of spontaneous miscarriages.   PAST SURGICAL HISTORY:  None.   ALLERGIES:  ASPIRIN.  THE PATIENT STATES THAT SHE DOES NOT KNOW WHETHER SHE  ACTUALLY HAS A REACTION TO ASPIRIN, BUT SHE FEELS THAT SHE IS ALLERGIC TO  IT.   HOME MEDICATIONS:  1.  Estradiol transdermal 0.05 mg per day patch.  The patient was started on      this approximately three months ago.  2.  Imitrex.  3.  Neurontin 300 mg p.o. at bedtime.   FAMILY HISTORY:  Her father had a history of blood clot, the location is  questionable.  He also has a history of diabetes mellitus,  hypercholesterolemia and hypertension.  Mother died of  lymphoma at the age  of 37, approximately 15 years ago.   SOCIAL HISTORY:  Cigarettes:  The patient denies.  Alcohol:  The patient  denies.  She is a vegetarian.   REVIEW OF SYSTEMS:  As per HPI.   PHYSICAL EXAMINATION:  GENERAL:  The patient is cooperative.  She does not  appear to be in any obvious distress at this particular time.  VITAL SIGNS:  Temperature 97.0, blood pressure 121/77, heart rate 97,  respirations 20, O2 saturation 100% on room air.  HEENT:  Anicteric.  Extraocular movements are intact.  Oral mucosa is pink.  NECK:  Supple.  No JVD.  Lymphadenopathy is not appreciated.  Thyromegaly is  not appreciated.  No bruits are auscultated.  CARDIAC:  S1, S2 is present.  Regular rate and rhythm, no S3, no S4.  No  murmurs, no gallops, no rubs.  RESPIRATORY:  Lungs are clear, no crackles or wheezes.  ABDOMEN:  Soft, nontender and nondistended.  Bowel sounds are present in all  four quadrants.  MUSCULOSKELETAL:  Upper and lower extremity strength is 5/5.  NEUROLOGIC:  The patient is  alert and oriented x3.   CT of the chest reveals bilateral pulmonary emboli within the lower regions  of the lungs.  There is also a right upper nodule present.  The radiologist  recommends followup CT within six months with regard to this nodule.  EKG  reveals normal sinus rhythm.   ASSESSMENT/PLAN:  [name] is a 49 year old female who visited her  primary care physician today secondary to acute onset right shoulder pain  with radiation down to her waistline and subsequently diagnosed with  bilateral pulmonary emboli.  1.  Bilateral pulmonary emboli.  Will initiate anticoagulation for the      patient.  Will try to identify the precipitating factor for the      patient's pulmonary emboli, although the estrogen that the patient      previously was on may have contributed.  Will go ahead and check protein      C, protein S, factor V Leiden, antiphospholipid antibodies and      antithrombin III.  Will also provide p.r.n. pain medication.  2.  History of migraine headaches.  Will provide the patient's home regimen      of medications on a p.r.n. basis.  3.  Gastrointestinal prophylaxis.  Will provide Protonix.      Michaelyn Barter, M.D.  Electronically Signed     OR/MEDQ  D:  04/17/2005  T:  04/17/2005  Job:  [id number]   cc:   Reuben Likes, M.D.  Fax: [phone]

## 2010-10-09 ENCOUNTER — Ambulatory Visit (INDEPENDENT_AMBULATORY_CARE_PROVIDER_SITE_OTHER): Payer: BC Managed Care – PPO | Admitting: *Deleted

## 2010-10-09 DIAGNOSIS — I2699 Other pulmonary embolism without acute cor pulmonale: Secondary | ICD-10-CM

## 2010-10-09 DIAGNOSIS — R894 Abnormal immunological findings in specimens from other organs, systems and tissues: Secondary | ICD-10-CM

## 2010-10-09 DIAGNOSIS — Z7901 Long term (current) use of anticoagulants: Secondary | ICD-10-CM

## 2010-10-09 DIAGNOSIS — R760 Raised antibody titer: Secondary | ICD-10-CM

## 2010-10-18 ENCOUNTER — Other Ambulatory Visit (INDEPENDENT_AMBULATORY_CARE_PROVIDER_SITE_OTHER): Payer: BC Managed Care – PPO

## 2010-10-18 DIAGNOSIS — E611 Iron deficiency: Secondary | ICD-10-CM

## 2010-10-18 DIAGNOSIS — D519 Vitamin B12 deficiency anemia, unspecified: Secondary | ICD-10-CM

## 2010-10-18 DIAGNOSIS — D518 Other vitamin B12 deficiency anemias: Secondary | ICD-10-CM

## 2010-10-18 LAB — CBC WITH DIFFERENTIAL/PLATELET
Basophils Relative: 0.3 % (ref 0.0–3.0)
Eosinophils Absolute: 0.1 10*3/uL (ref 0.0–0.7)
Lymphocytes Relative: 37.9 % (ref 12.0–46.0)
MCHC: 33 g/dL (ref 30.0–36.0)
MCV: 86.9 fl (ref 78.0–100.0)
Monocytes Absolute: 0.6 10*3/uL (ref 0.1–1.0)
Neutrophils Relative %: 52.1 % (ref 43.0–77.0)
Platelets: 226 10*3/uL (ref 150.0–400.0)
RBC: 4.55 Mil/uL (ref 3.87–5.11)
WBC: 7.9 10*3/uL (ref 4.5–10.5)

## 2010-10-18 LAB — VITAMIN B12: Vitamin B-12: 661 pg/mL (ref 211–911)

## 2010-10-18 LAB — HIGH SENSITIVITY CRP: CRP, High Sensitivity: 5.85 mg/L — ABNORMAL HIGH (ref 0.00–5.00)

## 2010-10-18 LAB — IBC PANEL: Iron: 62 ug/dL (ref 42–145)

## 2010-10-23 ENCOUNTER — Telehealth: Payer: Self-pay | Admitting: Internal Medicine

## 2010-10-23 NOTE — Telephone Encounter (Signed)
Pt had to rsc ov until 11-04-2010. Pt would like results of bloodwork.

## 2010-10-23 NOTE — Telephone Encounter (Signed)
Pt aware of results 

## 2010-10-23 NOTE — Telephone Encounter (Signed)
Ok to tell patient result .and we can give her a copy when she comes in.

## 2010-10-25 ENCOUNTER — Ambulatory Visit: Payer: Self-pay | Admitting: Internal Medicine

## 2010-11-04 ENCOUNTER — Ambulatory Visit: Payer: Self-pay | Admitting: Internal Medicine

## 2010-11-05 ENCOUNTER — Telehealth: Payer: Self-pay | Admitting: *Deleted

## 2010-11-05 ENCOUNTER — Encounter: Payer: Self-pay | Admitting: Internal Medicine

## 2010-11-05 ENCOUNTER — Encounter: Payer: Self-pay | Admitting: *Deleted

## 2010-11-05 ENCOUNTER — Ambulatory Visit (INDEPENDENT_AMBULATORY_CARE_PROVIDER_SITE_OTHER): Payer: BC Managed Care – PPO | Admitting: Internal Medicine

## 2010-11-05 VITALS — BP 122/78 | HR 73 | Temp 97.9°F | Resp 14 | Wt 156.0 lb

## 2010-11-05 DIAGNOSIS — Z7901 Long term (current) use of anticoagulants: Secondary | ICD-10-CM

## 2010-11-05 DIAGNOSIS — M069 Rheumatoid arthritis, unspecified: Secondary | ICD-10-CM

## 2010-11-05 DIAGNOSIS — R51 Headache: Secondary | ICD-10-CM

## 2010-11-05 DIAGNOSIS — E538 Deficiency of other specified B group vitamins: Secondary | ICD-10-CM

## 2010-11-05 DIAGNOSIS — Z86718 Personal history of other venous thrombosis and embolism: Secondary | ICD-10-CM

## 2010-11-05 DIAGNOSIS — R760 Raised antibody titer: Secondary | ICD-10-CM

## 2010-11-05 DIAGNOSIS — D509 Iron deficiency anemia, unspecified: Secondary | ICD-10-CM

## 2010-11-05 DIAGNOSIS — E785 Hyperlipidemia, unspecified: Secondary | ICD-10-CM

## 2010-11-05 DIAGNOSIS — R894 Abnormal immunological findings in specimens from other organs, systems and tissues: Secondary | ICD-10-CM

## 2010-11-05 DIAGNOSIS — R945 Abnormal results of liver function studies: Secondary | ICD-10-CM

## 2010-11-05 DIAGNOSIS — E559 Vitamin D deficiency, unspecified: Secondary | ICD-10-CM

## 2010-11-05 MED ORDER — CYANOCOBALAMIN 500 MCG/0.1ML NA SOLN: 1.0000 ug | NASAL | Status: DC

## 2010-11-05 MED ORDER — ALPRAZOLAM 0.25 MG PO TABS: 0.2500 mg | ORAL_TABLET | Freq: Every day | ORAL | Status: DC

## 2010-11-05 NOTE — Telephone Encounter (Signed)
Same Day Abstraction. 

## 2010-11-05 NOTE — Progress Notes (Signed)
  Subjective:    Patient ID: [name], female    DOB: 10-Mar-1961, 50 y.o.   MRN: [id number]  HPI Patient comes in for fu for  multiple medical problems   Since last visit she actually has been doing much better and stable at this point  Her RA is stable and tolerating meds well  MTX   Took tylenol after dental work recently  2 crowns .  Liver abnormality noted on last check .  To recheck in 3 weeks with dr D . She brings in copy of her labs today.  Review of Systems Sweats at night  Head pain better and off neurontin.  No new neuro issues No cp sob   Gi new GU issues  Anxiety is stable at this  Point    No bruising or bleeding.  Has been iron deficient and  Recheck better .  Taking B12 .    Past history family history social history reviewed personally updated as best as possible  in the electronic medical record.   Specialty physicians reviewed  .May not be complete.     Objective:   Physical Exam WDWN in nad   HEENT: Normocephalic ;atraumatic , Eyes;  PERRL, EOMs  Full, lids and conjunctiva clear,,Ears: no deformities, canals nl, TM landmarks normal, Nose: no deformity or discharge  Mouth : OP clear without lesion or edema . Neck no masses  Chest:  Clear to A&P without wheezes rales or rhonchi CV:  S1-S2 no gallops or murmurs peripheral perfusion is normal Abdomen:  Sof,t normal bowel sounds without hepatosplenomegaly, no guarding rebound or masses no CVA tenderness  No clubbing cyanosis or edema Hands look good without deformity or inflammation  Psych  Oriented x 3 and no noted deficits in memory, attention, and speech.      Labs results she has  reviewed with patient.   Assessment & Plan:  RA stable on mtx  Iron def   Better  B12 defic stable Vit d adequate replacement Bone health Liver    Minor abnormality could be from meds for dental work but is on MTX   Anticoagulation Head pain  Much better   Not worrisome to her at present.  migraines stable to call for  refills.   Total visit > 50% spent counseling and  review of data with patient and plan of care

## 2010-11-06 ENCOUNTER — Encounter: Payer: BC Managed Care – PPO | Admitting: *Deleted

## 2010-11-06 ENCOUNTER — Ambulatory Visit (INDEPENDENT_AMBULATORY_CARE_PROVIDER_SITE_OTHER): Payer: BC Managed Care – PPO | Admitting: *Deleted

## 2010-11-06 DIAGNOSIS — R760 Raised antibody titer: Secondary | ICD-10-CM

## 2010-11-06 DIAGNOSIS — I2699 Other pulmonary embolism without acute cor pulmonale: Secondary | ICD-10-CM

## 2010-11-06 DIAGNOSIS — Z7901 Long term (current) use of anticoagulants: Secondary | ICD-10-CM

## 2010-11-06 LAB — POCT INR: INR: 1.6

## 2010-12-04 ENCOUNTER — Encounter: Payer: BC Managed Care – PPO | Admitting: *Deleted

## 2010-12-05 ENCOUNTER — Ambulatory Visit (INDEPENDENT_AMBULATORY_CARE_PROVIDER_SITE_OTHER): Payer: BC Managed Care – PPO | Admitting: *Deleted

## 2010-12-05 DIAGNOSIS — Z7901 Long term (current) use of anticoagulants: Secondary | ICD-10-CM

## 2010-12-05 DIAGNOSIS — R760 Raised antibody titer: Secondary | ICD-10-CM

## 2010-12-05 DIAGNOSIS — I2699 Other pulmonary embolism without acute cor pulmonale: Secondary | ICD-10-CM

## 2010-12-05 LAB — POCT INR: INR: 1.2

## 2010-12-06 ENCOUNTER — Other Ambulatory Visit: Payer: Self-pay | Admitting: Internal Medicine

## 2010-12-08 ENCOUNTER — Encounter: Payer: Self-pay | Admitting: Internal Medicine

## 2010-12-08 DIAGNOSIS — Z7901 Long term (current) use of anticoagulants: Secondary | ICD-10-CM | POA: Insufficient documentation

## 2010-12-08 DIAGNOSIS — R945 Abnormal results of liver function studies: Secondary | ICD-10-CM | POA: Insufficient documentation

## 2010-12-12 ENCOUNTER — Ambulatory Visit (INDEPENDENT_AMBULATORY_CARE_PROVIDER_SITE_OTHER): Payer: BC Managed Care – PPO | Admitting: Psychology

## 2010-12-12 DIAGNOSIS — F411 Generalized anxiety disorder: Secondary | ICD-10-CM

## 2010-12-19 ENCOUNTER — Ambulatory Visit (INDEPENDENT_AMBULATORY_CARE_PROVIDER_SITE_OTHER): Payer: BC Managed Care – PPO | Admitting: *Deleted

## 2010-12-19 ENCOUNTER — Encounter: Payer: BC Managed Care – PPO | Admitting: *Deleted

## 2010-12-19 DIAGNOSIS — Z7901 Long term (current) use of anticoagulants: Secondary | ICD-10-CM

## 2010-12-19 DIAGNOSIS — I2699 Other pulmonary embolism without acute cor pulmonale: Secondary | ICD-10-CM

## 2010-12-19 DIAGNOSIS — R760 Raised antibody titer: Secondary | ICD-10-CM

## 2010-12-23 ENCOUNTER — Encounter: Payer: Self-pay | Admitting: Internal Medicine

## 2010-12-24 ENCOUNTER — Ambulatory Visit (INDEPENDENT_AMBULATORY_CARE_PROVIDER_SITE_OTHER): Payer: BC Managed Care – PPO | Admitting: Psychology

## 2010-12-24 DIAGNOSIS — F411 Generalized anxiety disorder: Secondary | ICD-10-CM

## 2011-01-03 ENCOUNTER — Ambulatory Visit (INDEPENDENT_AMBULATORY_CARE_PROVIDER_SITE_OTHER): Payer: BC Managed Care – PPO | Admitting: Psychology

## 2011-01-03 DIAGNOSIS — F411 Generalized anxiety disorder: Secondary | ICD-10-CM

## 2011-01-09 ENCOUNTER — Ambulatory Visit (INDEPENDENT_AMBULATORY_CARE_PROVIDER_SITE_OTHER): Payer: BC Managed Care – PPO | Admitting: *Deleted

## 2011-01-09 DIAGNOSIS — R760 Raised antibody titer: Secondary | ICD-10-CM

## 2011-01-09 DIAGNOSIS — Z7901 Long term (current) use of anticoagulants: Secondary | ICD-10-CM

## 2011-01-09 DIAGNOSIS — R894 Abnormal immunological findings in specimens from other organs, systems and tissues: Secondary | ICD-10-CM

## 2011-01-09 DIAGNOSIS — I2699 Other pulmonary embolism without acute cor pulmonale: Secondary | ICD-10-CM

## 2011-01-23 ENCOUNTER — Ambulatory Visit (INDEPENDENT_AMBULATORY_CARE_PROVIDER_SITE_OTHER): Payer: BC Managed Care – PPO | Admitting: *Deleted

## 2011-01-23 DIAGNOSIS — Z7901 Long term (current) use of anticoagulants: Secondary | ICD-10-CM

## 2011-01-23 DIAGNOSIS — R760 Raised antibody titer: Secondary | ICD-10-CM

## 2011-01-23 DIAGNOSIS — I2699 Other pulmonary embolism without acute cor pulmonale: Secondary | ICD-10-CM

## 2011-01-29 ENCOUNTER — Ambulatory Visit (INDEPENDENT_AMBULATORY_CARE_PROVIDER_SITE_OTHER): Payer: BC Managed Care – PPO | Admitting: *Deleted

## 2011-01-29 ENCOUNTER — Encounter: Payer: Self-pay | Admitting: Pharmacist

## 2011-01-29 DIAGNOSIS — R760 Raised antibody titer: Secondary | ICD-10-CM

## 2011-01-29 DIAGNOSIS — I2699 Other pulmonary embolism without acute cor pulmonale: Secondary | ICD-10-CM

## 2011-01-29 DIAGNOSIS — Z7901 Long term (current) use of anticoagulants: Secondary | ICD-10-CM

## 2011-01-29 DIAGNOSIS — R894 Abnormal immunological findings in specimens from other organs, systems and tissues: Secondary | ICD-10-CM

## 2011-02-12 ENCOUNTER — Ambulatory Visit (INDEPENDENT_AMBULATORY_CARE_PROVIDER_SITE_OTHER): Payer: BC Managed Care – PPO | Admitting: *Deleted

## 2011-02-12 DIAGNOSIS — Z7901 Long term (current) use of anticoagulants: Secondary | ICD-10-CM

## 2011-02-12 DIAGNOSIS — R760 Raised antibody titer: Secondary | ICD-10-CM

## 2011-02-12 DIAGNOSIS — I2699 Other pulmonary embolism without acute cor pulmonale: Secondary | ICD-10-CM

## 2011-02-12 LAB — POCT INR: INR: 1.7

## 2011-02-13 ENCOUNTER — Ambulatory Visit (INDEPENDENT_AMBULATORY_CARE_PROVIDER_SITE_OTHER): Payer: BC Managed Care – PPO | Admitting: Psychology

## 2011-02-13 DIAGNOSIS — F411 Generalized anxiety disorder: Secondary | ICD-10-CM

## 2011-02-14 ENCOUNTER — Other Ambulatory Visit: Payer: Self-pay | Admitting: Internal Medicine

## 2011-02-14 ENCOUNTER — Other Ambulatory Visit: Payer: Self-pay | Admitting: Obstetrics and Gynecology

## 2011-02-14 DIAGNOSIS — Z1231 Encounter for screening mammogram for malignant neoplasm of breast: Secondary | ICD-10-CM

## 2011-03-12 ENCOUNTER — Ambulatory Visit (INDEPENDENT_AMBULATORY_CARE_PROVIDER_SITE_OTHER): Payer: BC Managed Care – PPO | Admitting: *Deleted

## 2011-03-12 DIAGNOSIS — I2699 Other pulmonary embolism without acute cor pulmonale: Secondary | ICD-10-CM

## 2011-03-12 DIAGNOSIS — R760 Raised antibody titer: Secondary | ICD-10-CM

## 2011-03-12 DIAGNOSIS — Z7901 Long term (current) use of anticoagulants: Secondary | ICD-10-CM

## 2011-03-17 ENCOUNTER — Ambulatory Visit
Admission: RE | Admit: 2011-03-17 | Discharge: 2011-03-17 | Disposition: A | Discharge status: Home / Self Care | Payer: BC Managed Care – PPO | Source: Ambulatory Visit | Attending: Obstetrics and Gynecology | Admitting: Obstetrics and Gynecology

## 2011-03-17 DIAGNOSIS — Z1231 Encounter for screening mammogram for malignant neoplasm of breast: Secondary | ICD-10-CM

## 2011-03-20 ENCOUNTER — Other Ambulatory Visit: Payer: Self-pay | Admitting: Internal Medicine

## 2011-04-08 ENCOUNTER — Encounter: Payer: BC Managed Care – PPO | Admitting: *Deleted

## 2011-04-08 ENCOUNTER — Ambulatory Visit (INDEPENDENT_AMBULATORY_CARE_PROVIDER_SITE_OTHER): Payer: BC Managed Care – PPO | Admitting: *Deleted

## 2011-04-08 DIAGNOSIS — R760 Raised antibody titer: Secondary | ICD-10-CM

## 2011-04-08 DIAGNOSIS — I2699 Other pulmonary embolism without acute cor pulmonale: Secondary | ICD-10-CM

## 2011-04-08 DIAGNOSIS — Z7901 Long term (current) use of anticoagulants: Secondary | ICD-10-CM

## 2011-04-08 LAB — POCT INR: INR: 1.6

## 2011-04-08 MED ORDER — WARFARIN SODIUM 2.5 MG PO TABS: ORAL_TABLET | ORAL | Status: DC

## 2011-04-17 ENCOUNTER — Other Ambulatory Visit: Payer: Self-pay | Admitting: Internal Medicine

## 2011-04-22 ENCOUNTER — Other Ambulatory Visit (INDEPENDENT_AMBULATORY_CARE_PROVIDER_SITE_OTHER): Payer: BC Managed Care – PPO

## 2011-04-22 DIAGNOSIS — Z0000 Encounter for general adult medical examination without abnormal findings: Secondary | ICD-10-CM

## 2011-04-22 LAB — CBC WITH DIFFERENTIAL/PLATELET
Basophils Relative: 0.4 % (ref 0.0–3.0)
Eosinophils Relative: 1.6 % (ref 0.0–5.0)
HCT: 41.1 % (ref 36.0–46.0)
Hemoglobin: 13.7 g/dL (ref 12.0–15.0)
Lymphs Abs: 2.4 10*3/uL (ref 0.7–4.0)
MCV: 86.2 fl (ref 78.0–100.0)
Monocytes Absolute: 0.7 10*3/uL (ref 0.1–1.0)
Neutro Abs: 4.4 10*3/uL (ref 1.4–7.7)
Platelets: 211 10*3/uL (ref 150.0–400.0)
WBC: 7.7 10*3/uL (ref 4.5–10.5)

## 2011-04-22 LAB — POCT URINALYSIS DIPSTICK
Nitrite, UA: NEGATIVE
Protein, UA: NEGATIVE
Urobilinogen, UA: 0.2
pH, UA: 7

## 2011-04-22 LAB — BASIC METABOLIC PANEL
BUN: 12 mg/dL (ref 6–23)
Chloride: 105 mEq/L (ref 96–112)
Potassium: 5 mEq/L (ref 3.5–5.1)
Sodium: 143 mEq/L (ref 135–145)

## 2011-04-22 LAB — LIPID PANEL
Cholesterol: 199 mg/dL (ref 0–200)
Triglycerides: 140 mg/dL (ref 0.0–149.0)

## 2011-04-22 LAB — HEPATIC FUNCTION PANEL
ALT: 17 U/L (ref 0–35)
AST: 19 U/L (ref 0–37)
Albumin: 3.9 g/dL (ref 3.5–5.2)
Total Bilirubin: 0.6 mg/dL (ref 0.3–1.2)
Total Protein: 7.2 g/dL (ref 6.0–8.3)

## 2011-04-29 ENCOUNTER — Ambulatory Visit (INDEPENDENT_AMBULATORY_CARE_PROVIDER_SITE_OTHER): Payer: BC Managed Care – PPO | Admitting: Internal Medicine

## 2011-04-29 ENCOUNTER — Encounter: Payer: Self-pay | Admitting: Internal Medicine

## 2011-04-29 DIAGNOSIS — L853 Xerosis cutis: Secondary | ICD-10-CM

## 2011-04-29 DIAGNOSIS — F4322 Adjustment disorder with anxiety: Secondary | ICD-10-CM

## 2011-04-29 DIAGNOSIS — E538 Deficiency of other specified B group vitamins: Secondary | ICD-10-CM

## 2011-04-29 DIAGNOSIS — Z7901 Long term (current) use of anticoagulants: Secondary | ICD-10-CM

## 2011-04-29 DIAGNOSIS — M810 Age-related osteoporosis without current pathological fracture: Secondary | ICD-10-CM

## 2011-04-29 DIAGNOSIS — D509 Iron deficiency anemia, unspecified: Secondary | ICD-10-CM

## 2011-04-29 DIAGNOSIS — R682 Dry mouth, unspecified: Secondary | ICD-10-CM

## 2011-04-29 DIAGNOSIS — R238 Other skin changes: Secondary | ICD-10-CM

## 2011-04-29 DIAGNOSIS — K117 Disturbances of salivary secretion: Secondary | ICD-10-CM

## 2011-04-29 DIAGNOSIS — L609 Nail disorder, unspecified: Secondary | ICD-10-CM

## 2011-04-29 MED ORDER — UREA 40 % EX CREA: 1.0000 "application " | TOPICAL_CREAM | Freq: Two times a day (BID) | CUTANEOUS | Status: DC

## 2011-04-29 NOTE — Progress Notes (Signed)
  Subjective:    Patient ID: [name], female    DOB: 01/21/1961, 50 y.o.   MRN: [id number]  HPI  Patient comes in today for follow up of  multiple medical problems.   Wasn't to discuss 3 things or so and go over lbs   Boniva  Chest pain  X 1  altevia x 5 weeks   Sick after med hard to get ou bed   Pain foot feet ? What to do. ?  Wants opinion.Consider  Injection per dr D .Has copy of  dexa scan done eagle 7 12  Drop in hip -2.3 to -2.5 range taking vit d and calcium supp . No fracture    Dry mouth ongoing worse at night   Asks about intervention  Dr D called in med but warned of se .   Toe nail right   Painted nail  And removed recently and has white spots on it asks about this no pain.  RA about the same on low dose prednisone mtx   HAs stable  Scaling  On heels and feet has seen dr lomax  Asks i we can refill the carmol 40 % 85 use bid prn  Iron def  No bleeding   Anxiety stable at this point.  Review of Systems No cp sob bleeding  New joint sx  Syncope hair change.  Past history family history social history reviewed in the electronic medical record.     Objective:   Physical Exam WDWN in nad   Looks well  Reviewed labs from Dr D  And dexa scan from Dr  Richardson Dopp  Lab Results  Component Value Date   WBC 7.7 04/22/2011   HGB 13.7 04/22/2011   HCT 41.1 04/22/2011   PLT 211.0 04/22/2011   GLUCOSE 91 04/22/2011   CHOL 199 04/22/2011   TRIG 140.0 04/22/2011   HDL 86.10 04/22/2011   LDLCALC 85 04/22/2011   ALT 17 04/22/2011   AST 19 04/22/2011   NA 143 04/22/2011   K 5.0 04/22/2011   CL 105 04/22/2011   CREATININE 0.8 04/22/2011   BUN 12 04/22/2011   CO2 29 04/22/2011   TSH 2.94 04/22/2011   INR 1.6 04/08/2011         Assessment & Plan:  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 SE of meds  boniva and risendronate.  Bone foot pain some gi.   Reviewed  Poss as Dr D did  .   For she is at risk.forrteo discussion.   Iron  defic better . B12  Stable lfts good RA  Total visit > 50% spent counseling and coordinating care

## 2011-04-29 NOTE — Patient Instructions (Signed)
Your labs are good iron is better  Should I think forteo    Is a good choice as you can stop this at any time.

## 2011-05-01 ENCOUNTER — Encounter: Payer: Self-pay | Admitting: Internal Medicine

## 2011-05-05 ENCOUNTER — Ambulatory Visit (INDEPENDENT_AMBULATORY_CARE_PROVIDER_SITE_OTHER): Payer: BC Managed Care – PPO | Admitting: Psychology

## 2011-05-05 DIAGNOSIS — F411 Generalized anxiety disorder: Secondary | ICD-10-CM

## 2011-05-07 ENCOUNTER — Ambulatory Visit (INDEPENDENT_AMBULATORY_CARE_PROVIDER_SITE_OTHER): Payer: BC Managed Care – PPO | Admitting: *Deleted

## 2011-05-07 ENCOUNTER — Encounter: Payer: BC Managed Care – PPO | Admitting: *Deleted

## 2011-05-07 DIAGNOSIS — R760 Raised antibody titer: Secondary | ICD-10-CM

## 2011-05-07 DIAGNOSIS — I2699 Other pulmonary embolism without acute cor pulmonale: Secondary | ICD-10-CM

## 2011-05-07 DIAGNOSIS — Z7901 Long term (current) use of anticoagulants: Secondary | ICD-10-CM

## 2011-05-14 ENCOUNTER — Ambulatory Visit (INDEPENDENT_AMBULATORY_CARE_PROVIDER_SITE_OTHER): Payer: BC Managed Care – PPO | Admitting: Psychology

## 2011-05-14 DIAGNOSIS — F411 Generalized anxiety disorder: Secondary | ICD-10-CM

## 2011-05-19 ENCOUNTER — Other Ambulatory Visit: Payer: Self-pay | Admitting: Internal Medicine

## 2011-06-02 ENCOUNTER — Other Ambulatory Visit: Payer: Self-pay | Admitting: Obstetrics and Gynecology

## 2011-06-02 ENCOUNTER — Other Ambulatory Visit (HOSPITAL_COMMUNITY)
Admission: RE | Admit: 2011-06-02 | Discharge: 2011-06-02 | Disposition: A | Discharge status: Home / Self Care | Payer: BC Managed Care – PPO | Source: Ambulatory Visit | Attending: Obstetrics and Gynecology | Admitting: Obstetrics and Gynecology

## 2011-06-02 DIAGNOSIS — Z01419 Encounter for gynecological examination (general) (routine) without abnormal findings: Secondary | ICD-10-CM | POA: Insufficient documentation

## 2011-06-04 ENCOUNTER — Ambulatory Visit (INDEPENDENT_AMBULATORY_CARE_PROVIDER_SITE_OTHER): Payer: BC Managed Care – PPO | Admitting: *Deleted

## 2011-06-04 DIAGNOSIS — R894 Abnormal immunological findings in specimens from other organs, systems and tissues: Secondary | ICD-10-CM

## 2011-06-04 DIAGNOSIS — I2699 Other pulmonary embolism without acute cor pulmonale: Secondary | ICD-10-CM

## 2011-06-04 DIAGNOSIS — R760 Raised antibody titer: Secondary | ICD-10-CM

## 2011-06-04 DIAGNOSIS — Z7901 Long term (current) use of anticoagulants: Secondary | ICD-10-CM

## 2011-06-04 LAB — POCT INR: INR: 1.3

## 2011-06-05 ENCOUNTER — Ambulatory Visit (INDEPENDENT_AMBULATORY_CARE_PROVIDER_SITE_OTHER): Payer: BC Managed Care – PPO | Admitting: Psychology

## 2011-06-05 DIAGNOSIS — F411 Generalized anxiety disorder: Secondary | ICD-10-CM

## 2011-06-20 ENCOUNTER — Telehealth: Payer: Self-pay | Admitting: Internal Medicine

## 2011-06-20 MED ORDER — MULTIGEN 70 MG PO TABS: 1.0000 | ORAL_TABLET | Freq: Every day | ORAL | Status: DC

## 2011-06-20 NOTE — Telephone Encounter (Signed)
Pt needs to get a script for Multi-gen. This was orig prescribed by her GI dr, but pt is req that Dr Fabian Sharp write script and call in to [facility] on [address].

## 2011-06-20 NOTE — Telephone Encounter (Signed)
Rx sent to pharmacy   

## 2011-06-25 ENCOUNTER — Ambulatory Visit (INDEPENDENT_AMBULATORY_CARE_PROVIDER_SITE_OTHER): Payer: BC Managed Care – PPO | Admitting: Pharmacist

## 2011-06-25 DIAGNOSIS — R002 Palpitations: Secondary | ICD-10-CM

## 2011-06-25 DIAGNOSIS — Z7901 Long term (current) use of anticoagulants: Secondary | ICD-10-CM

## 2011-06-25 DIAGNOSIS — R760 Raised antibody titer: Secondary | ICD-10-CM

## 2011-06-25 DIAGNOSIS — I2699 Other pulmonary embolism without acute cor pulmonale: Secondary | ICD-10-CM

## 2011-06-25 LAB — POCT INR: INR: 1.4

## 2011-07-16 ENCOUNTER — Other Ambulatory Visit: Payer: Self-pay | Admitting: Internal Medicine

## 2011-07-16 NOTE — Telephone Encounter (Signed)
Ok x 1

## 2011-07-23 ENCOUNTER — Ambulatory Visit (INDEPENDENT_AMBULATORY_CARE_PROVIDER_SITE_OTHER): Payer: BC Managed Care – PPO | Admitting: Cardiology

## 2011-07-23 ENCOUNTER — Ambulatory Visit (INDEPENDENT_AMBULATORY_CARE_PROVIDER_SITE_OTHER): Payer: BC Managed Care – PPO | Admitting: Pharmacist

## 2011-07-23 ENCOUNTER — Encounter: Payer: Self-pay | Admitting: Cardiology

## 2011-07-23 VITALS — BP 114/80 | HR 61 | Ht 64.0 in | Wt 156.0 lb

## 2011-07-23 DIAGNOSIS — Z7901 Long term (current) use of anticoagulants: Secondary | ICD-10-CM

## 2011-07-23 DIAGNOSIS — Z86718 Personal history of other venous thrombosis and embolism: Secondary | ICD-10-CM

## 2011-07-23 DIAGNOSIS — R894 Abnormal immunological findings in specimens from other organs, systems and tissues: Secondary | ICD-10-CM

## 2011-07-23 DIAGNOSIS — R079 Chest pain, unspecified: Secondary | ICD-10-CM

## 2011-07-23 DIAGNOSIS — R760 Raised antibody titer: Secondary | ICD-10-CM

## 2011-07-23 DIAGNOSIS — I2699 Other pulmonary embolism without acute cor pulmonale: Secondary | ICD-10-CM

## 2011-07-23 DIAGNOSIS — R002 Palpitations: Secondary | ICD-10-CM

## 2011-07-23 NOTE — Assessment & Plan Note (Signed)
Chronic and not cardiac. Reassurance given. See me back on a p.r.n. Basis.

## 2011-07-23 NOTE — Assessment & Plan Note (Signed)
Intermittent and benign. Reassurance given. See [place] back p.r.n.

## 2011-07-23 NOTE — Patient Instructions (Signed)
Your physician recommends that you continue on your current medications as directed. Please refer to the Current Medication list given to you today.  Your physician recommends that you schedule a follow-up appointment as needed with Dr. Wall.  

## 2011-07-23 NOTE — Progress Notes (Signed)
HPI [name] returns today for evaluation of chronic chest pain which has been felt not to be cardiac in all flow intermittent palpitations. Her chest pain is actually less and her palpitations still recur in the early mornings. They get better if she becomes active and gets around.  Her last cholesterol was 199, HDL was 86. I reassured her that she does not the pharmacological therapy for her lipids.  She has multiple questions as usual and is very anxious. I tried once again to reassure her.  Past Medical History  Diagnosis Date  . Chest pain, unspecified   . Palpitations   . Personal history of venous thrombosis and embolism   . Chronic anticoagulation   . Adjustment disorder with anxiety   . Other and unspecified hyperlipidemia   . B12 deficiency due to diet     is a vegetarian  . Unspecified vitamin D deficiency   . Otalgia, unspecified   . Iron deficiency anemia, unspecified   . Rheumatoid arthritis     deveshewar  . Headache     migraines and head pain  . Hx of varicella     As Child    Current Outpatient Prescriptions  Medication Sig Dispense Refill  . Bromfenac Sodium (BROMDAY) 0.09 % (DAILY) SOLN Apply 1 drop to eye.        . Cholecalciferol (VITAMIN D3) 2000 UNITS capsule Take 2,000 Units by mouth daily.        . Cyanocobalamin (NASCOBAL) 500 MCG/0.1ML SOLN Place 0.01 mLs (50 mcg total) into the nose as directed.  1 Bottle  3  . cycloSPORINE (RESTASIS) 0.05 % ophthalmic emulsion Place 1 drop into both eyes daily.        . folic acid (FOLVITE) 1 MG tablet Take 1 mg by mouth 2 (two) times daily.        Marland Kitchen FORTEO 600 MCG/2.4ML SOLN as directed.      . hydroxychloroquine (PLAQUENIL) 200 MG tablet Take 200 mg by mouth 2 (two) times daily.        Marland Kitchen ibandronate (BONIVA) 150 MG tablet Take 150 mg by mouth every 30 (thirty) days. Take in the morning with a full glass of water, on an empty stomach, and do not take anything else by mouth or lie down for the next 30 min.        . methotrexate (RHEUMATREX) 2.5 MG tablet Take 20 mg by mouth once a week. Caution:Chemotherapy. Protect from light. 8 tablets weekly.      . mineral/vitamin supplement (MULTIGEN) 70 MG TABS Take 1 tablet (70 mg total) by mouth daily.  30 each  4  . predniSONE (DELTASONE) 1 MG tablet Take 2 mg by mouth daily. 1.5mg  daily      . SUMAtriptan (IMITREX) 50 MG tablet TAKE ONE TABLET BY MOUTH AS NEEDED. MAY REPEAT IN 2 TO 4 HOURS IF NEEDED.  9 tablet  1  . warfarin (COUMADIN) 2.5 MG tablet Take as directed by Anticoagulation clinic.  Pt takes up to 2 tablets daily.  160 tablet  0  . XANAX 0.25 MG tablet TAKE ONE TABLET BY MOUTH EVERY DAY  30 each  0    Allergies  Allergen Reactions  . Boniva (Ibandronate Sodium)     Heartburn  . Ivp Dye (Iodinated Diagnostic Agents) Swelling    Mouth swelling.  . Risedronate Sodium (Atelvia) Other (See Comments)    Foot and bone pain and fatigue    Family History  Problem Relation Age of Onset  .  Dementia Father   . Stroke Father   . Hyperlipidemia Father   . Diabetes Father   . Parkinsonism Mother   . Lymphoma      History   Social History  . Marital Status: Married    Spouse Name: N/A    Number of Children: N/A  . Years of Education: N/A   Occupational History  . Not on file.   Social History Main Topics  . Smoking status: Never Smoker   . Smokeless tobacco: Not on file  . Alcohol Use: No  . Drug Use: No  . Sexually Active: Not on file   Other Topics Concern  . Not on file   Social History Narrative   Regular Exercise-no20 yrs in [place] at [employer].Married HH  of 3 G1P1    ROS ALL NEGATIVE EXCEPT THOSE NOTED IN HPI  PE  General Appearance: well developed, well nourished in no acute distress, obese HEENT: symmetrical face, PERRLA, good dentition  Neck: no JVD, thyromegaly, or adenopathy, trachea midline Chest: symmetric without deformity Cardiac: PMI non-displaced, RRR, normal S1, S2, no gallop or murmur Lung: clear  to ausculation and percussion Vascular: all pulses full without bruits  Abdominal: nondistended, nontender, good bowel sounds, no HSM, no bruits Extremities: no cyanosis, clubbing or edema, no sign of DVT, no varicosities  Skin: normal color, no rashes Neuro: alert and oriented x 3, non-focal Pysch: normal affect  EKG Normal sinus rhythm, normal EKG BMET    Component Value Date/Time   NA 143 04/22/2011 0818   K 5.0 04/22/2011 0818   CL 105 04/22/2011 0818   CO2 29 04/22/2011 0818   GLUCOSE 91 04/22/2011 0818   BUN 12 04/22/2011 0818   CREATININE 0.8 04/22/2011 0818   CALCIUM 9.2 04/22/2011 0818   GFRNONAA 96.66 04/09/2010 0936    Lipid Panel     Component Value Date/Time   CHOL 199 04/22/2011 0818   TRIG 140.0 04/22/2011 0818   HDL 86.10 04/22/2011 0818   CHOLHDL 2 04/22/2011 0818   VLDL 28.0 04/22/2011 0818   LDLCALC 85 04/22/2011 0818    CBC    Component Value Date/Time   WBC 7.7 04/22/2011 0818   WBC 10.0 02/02/2007 1434   RBC 4.77 04/22/2011 0818   RBC 4.90 02/02/2007 1434   HGB 13.7 04/22/2011 0818   HGB 13.7 02/02/2007 1434   HCT 41.1 04/22/2011 0818   HCT 39.4 02/02/2007 1434   PLT 211.0 04/22/2011 0818   PLT 393 02/02/2007 1434   MCV 86.2 04/22/2011 0818   MCV 80.4* 02/02/2007 1434   MCH 27.9 02/02/2007 1434   MCHC 33.3 04/22/2011 0818   MCHC 34.6 02/02/2007 1434   RDW 13.3 04/22/2011 0818   RDW 12.7 02/02/2007 1434   LYMPHSABS 2.4 04/22/2011 0818   LYMPHSABS 2.7 02/02/2007 1434   MONOABS 0.7 04/22/2011 0818   MONOABS 0.6 02/02/2007 1434   EOSABS 0.1 04/22/2011 0818   EOSABS 0.1 02/02/2007 1434   BASOSABS 0.0 04/22/2011 0818   BASOSABS 0.0 02/02/2007 1434

## 2011-07-24 ENCOUNTER — Other Ambulatory Visit: Payer: Self-pay | Admitting: Cardiology

## 2011-07-28 ENCOUNTER — Other Ambulatory Visit: Payer: Self-pay | Admitting: Cardiology

## 2011-07-28 ENCOUNTER — Ambulatory Visit (INDEPENDENT_AMBULATORY_CARE_PROVIDER_SITE_OTHER): Payer: BC Managed Care – PPO | Admitting: Psychology

## 2011-07-28 DIAGNOSIS — F411 Generalized anxiety disorder: Secondary | ICD-10-CM

## 2011-08-13 ENCOUNTER — Other Ambulatory Visit: Payer: Self-pay | Admitting: Internal Medicine

## 2011-08-13 NOTE — Telephone Encounter (Signed)
Ok x 1

## 2011-08-19 NOTE — Telephone Encounter (Signed)
i thought we refilled this but if  We didn't    ok x 2

## 2011-08-21 ENCOUNTER — Ambulatory Visit (INDEPENDENT_AMBULATORY_CARE_PROVIDER_SITE_OTHER): Payer: BC Managed Care – PPO | Admitting: *Deleted

## 2011-08-21 DIAGNOSIS — Z7901 Long term (current) use of anticoagulants: Secondary | ICD-10-CM

## 2011-08-21 DIAGNOSIS — I2699 Other pulmonary embolism without acute cor pulmonale: Secondary | ICD-10-CM

## 2011-08-21 DIAGNOSIS — R760 Raised antibody titer: Secondary | ICD-10-CM

## 2011-08-26 ENCOUNTER — Other Ambulatory Visit: Payer: Self-pay

## 2011-08-26 MED ORDER — UREA 40 % EX CREA: TOPICAL_CREAM | CUTANEOUS | Status: DC

## 2011-08-26 NOTE — Telephone Encounter (Signed)
Rx sent to pharmacy for urea 40% cream.

## 2011-09-11 ENCOUNTER — Ambulatory Visit: Payer: BC Managed Care – PPO | Admitting: Psychology

## 2011-09-12 ENCOUNTER — Ambulatory Visit (INDEPENDENT_AMBULATORY_CARE_PROVIDER_SITE_OTHER): Payer: BC Managed Care – PPO | Admitting: Psychology

## 2011-09-12 DIAGNOSIS — F411 Generalized anxiety disorder: Secondary | ICD-10-CM

## 2011-09-14 ENCOUNTER — Other Ambulatory Visit: Payer: Self-pay | Admitting: Internal Medicine

## 2011-09-18 ENCOUNTER — Ambulatory Visit (INDEPENDENT_AMBULATORY_CARE_PROVIDER_SITE_OTHER): Payer: BC Managed Care – PPO | Admitting: *Deleted

## 2011-09-18 DIAGNOSIS — Z7901 Long term (current) use of anticoagulants: Secondary | ICD-10-CM

## 2011-09-18 DIAGNOSIS — I2699 Other pulmonary embolism without acute cor pulmonale: Secondary | ICD-10-CM

## 2011-09-18 DIAGNOSIS — R760 Raised antibody titer: Secondary | ICD-10-CM

## 2011-09-18 LAB — POCT INR: INR: 1.6

## 2011-10-01 ENCOUNTER — Ambulatory Visit (INDEPENDENT_AMBULATORY_CARE_PROVIDER_SITE_OTHER): Payer: BC Managed Care – PPO | Admitting: Psychology

## 2011-10-01 DIAGNOSIS — F411 Generalized anxiety disorder: Secondary | ICD-10-CM

## 2011-10-03 ENCOUNTER — Ambulatory Visit (INDEPENDENT_AMBULATORY_CARE_PROVIDER_SITE_OTHER): Payer: BC Managed Care – PPO | Admitting: Psychology

## 2011-10-03 DIAGNOSIS — F411 Generalized anxiety disorder: Secondary | ICD-10-CM

## 2011-10-12 ENCOUNTER — Other Ambulatory Visit: Payer: Self-pay | Admitting: Internal Medicine

## 2011-10-15 NOTE — Telephone Encounter (Signed)
Rx last filled 08/13/11/ #30x1.  Pt last seen 04/29/11.  NOV on 11/05/11.  Pls advise.

## 2011-10-16 ENCOUNTER — Ambulatory Visit (INDEPENDENT_AMBULATORY_CARE_PROVIDER_SITE_OTHER): Payer: BC Managed Care – PPO | Admitting: *Deleted

## 2011-10-16 DIAGNOSIS — Z7901 Long term (current) use of anticoagulants: Secondary | ICD-10-CM

## 2011-10-16 DIAGNOSIS — R894 Abnormal immunological findings in specimens from other organs, systems and tissues: Secondary | ICD-10-CM

## 2011-10-16 DIAGNOSIS — I2699 Other pulmonary embolism without acute cor pulmonale: Secondary | ICD-10-CM

## 2011-10-16 DIAGNOSIS — R760 Raised antibody titer: Secondary | ICD-10-CM

## 2011-10-16 LAB — POCT INR: INR: 1.4

## 2011-10-16 NOTE — Telephone Encounter (Signed)
Ok x 2  To refill

## 2011-10-22 ENCOUNTER — Ambulatory Visit (INDEPENDENT_AMBULATORY_CARE_PROVIDER_SITE_OTHER): Payer: BC Managed Care – PPO | Admitting: Psychology

## 2011-10-22 DIAGNOSIS — F411 Generalized anxiety disorder: Secondary | ICD-10-CM

## 2011-10-23 ENCOUNTER — Ambulatory Visit (INDEPENDENT_AMBULATORY_CARE_PROVIDER_SITE_OTHER): Payer: BC Managed Care – PPO | Admitting: Psychology

## 2011-10-23 DIAGNOSIS — F411 Generalized anxiety disorder: Secondary | ICD-10-CM

## 2011-10-28 ENCOUNTER — Other Ambulatory Visit (INDEPENDENT_AMBULATORY_CARE_PROVIDER_SITE_OTHER): Payer: BC Managed Care – PPO

## 2011-10-28 DIAGNOSIS — E611 Iron deficiency: Secondary | ICD-10-CM

## 2011-10-28 DIAGNOSIS — D509 Iron deficiency anemia, unspecified: Secondary | ICD-10-CM

## 2011-10-28 LAB — CBC WITH DIFFERENTIAL/PLATELET
Basophils Absolute: 0 10*3/uL (ref 0.0–0.1)
HCT: 42 % (ref 36.0–46.0)
Lymphs Abs: 3 10*3/uL (ref 0.7–4.0)
MCHC: 32.3 g/dL (ref 30.0–36.0)
MCV: 86.3 fl (ref 78.0–100.0)
Monocytes Absolute: 0.6 10*3/uL (ref 0.1–1.0)
Platelets: 228 10*3/uL (ref 150.0–400.0)
RDW: 13.4 % (ref 11.5–14.6)

## 2011-10-28 LAB — IBC PANEL: Iron: 89 ug/dL (ref 42–145)

## 2011-11-04 ENCOUNTER — Ambulatory Visit (INDEPENDENT_AMBULATORY_CARE_PROVIDER_SITE_OTHER): Payer: BC Managed Care – PPO

## 2011-11-04 ENCOUNTER — Ambulatory Visit: Payer: BC Managed Care – PPO | Admitting: Internal Medicine

## 2011-11-04 DIAGNOSIS — Z7901 Long term (current) use of anticoagulants: Secondary | ICD-10-CM

## 2011-11-04 DIAGNOSIS — I2699 Other pulmonary embolism without acute cor pulmonale: Secondary | ICD-10-CM

## 2011-11-04 DIAGNOSIS — R760 Raised antibody titer: Secondary | ICD-10-CM

## 2011-11-04 DIAGNOSIS — R894 Abnormal immunological findings in specimens from other organs, systems and tissues: Secondary | ICD-10-CM

## 2011-11-04 LAB — POCT INR: INR: 1.8

## 2011-11-05 ENCOUNTER — Ambulatory Visit: Payer: BC Managed Care – PPO | Admitting: Internal Medicine

## 2011-11-10 ENCOUNTER — Ambulatory Visit (INDEPENDENT_AMBULATORY_CARE_PROVIDER_SITE_OTHER): Payer: BC Managed Care – PPO | Admitting: Psychology

## 2011-11-10 DIAGNOSIS — F411 Generalized anxiety disorder: Secondary | ICD-10-CM

## 2011-11-11 ENCOUNTER — Ambulatory Visit: Payer: BC Managed Care – PPO | Admitting: Internal Medicine

## 2011-11-14 ENCOUNTER — Ambulatory Visit (INDEPENDENT_AMBULATORY_CARE_PROVIDER_SITE_OTHER): Payer: BC Managed Care – PPO | Admitting: Psychology

## 2011-11-14 DIAGNOSIS — F411 Generalized anxiety disorder: Secondary | ICD-10-CM

## 2011-11-17 ENCOUNTER — Ambulatory Visit (INDEPENDENT_AMBULATORY_CARE_PROVIDER_SITE_OTHER): Payer: BC Managed Care – PPO | Admitting: Internal Medicine

## 2011-11-17 ENCOUNTER — Other Ambulatory Visit: Payer: Self-pay | Admitting: Family Medicine

## 2011-11-17 ENCOUNTER — Encounter: Payer: Self-pay | Admitting: Internal Medicine

## 2011-11-17 VITALS — BP 96/64 | HR 78 | Temp 98.0°F | Wt 157.0 lb

## 2011-11-17 DIAGNOSIS — F4322 Adjustment disorder with anxiety: Secondary | ICD-10-CM

## 2011-11-17 DIAGNOSIS — Z7901 Long term (current) use of anticoagulants: Secondary | ICD-10-CM

## 2011-11-17 DIAGNOSIS — E538 Deficiency of other specified B group vitamins: Secondary | ICD-10-CM

## 2011-11-17 DIAGNOSIS — M810 Age-related osteoporosis without current pathological fracture: Secondary | ICD-10-CM

## 2011-11-17 DIAGNOSIS — M069 Rheumatoid arthritis, unspecified: Secondary | ICD-10-CM

## 2011-11-17 DIAGNOSIS — D509 Iron deficiency anemia, unspecified: Secondary | ICD-10-CM

## 2011-11-17 DIAGNOSIS — E559 Vitamin D deficiency, unspecified: Secondary | ICD-10-CM

## 2011-11-17 MED ORDER — ALPRAZOLAM 0.25 MG PO TABS: 0.2500 mg | ORAL_TABLET | Freq: Every evening | ORAL | Status: DC | PRN

## 2011-11-17 MED ORDER — NASCOBAL 500 MCG/0.1ML NA SOLN: NASAL | Status: DC

## 2011-11-17 MED ORDER — MULTIGEN 70 MG PO TABS: 1.0000 | ORAL_TABLET | Freq: Every day | ORAL | Status: DC

## 2011-11-17 NOTE — Patient Instructions (Signed)
6 months do CPX    With labs and add magnesium  To the labs   Would avoid extra supplements unless deficient or avised by Dr Durenda Age.  Supplements are not scrutinized by the fda  As are rx meds.  Anything in a pill  isnot natural.  Look for foods that may have a nutrient in it., IF the area of pain  Is  persistent or progressive get dr Durenda Age to see you. Does not seem alarming at this time.

## 2011-11-17 NOTE — Progress Notes (Signed)
Subjective:    Patient ID: [name], female    DOB: 03-Mar-1961, 51 y.o.   MRN: [id number]  HPI Patient comes in today for follow up of  multiple medical problems.  Last visit 12 12  Since last visit 1 12 no major changes in health  : RA  Sees dr D evey 5-6 months   Having  Pain off an on right iliac. Area no injury comes and goes   Bone health: decided not to do forteo and now back on boniva.    No new heart burn and to do dexa in 2 years.   Had urology evaluation for dyruria.  And given vesicare fo one week  And stopped.  Because too much medicine.  Has appt Dr.  De Nurse.  Family encouraging her to see doc in [place] who advised multiple supplements to help RA and will aker her feel better  . They take somewhere near 30 pills per day. Asks opinion.  Thinks the number of pills a bit escessive.  Review of Systems No fever cp sob bleeding fever chills  Rest as per hpi.  Anxiety stable.  Past history family history social history reviewed in the electronic medical record. Outpatient Encounter Prescriptions as of 11/17/2011  Medication Sig Dispense Refill  . ALPRAZolam (XANAX) 0.25 MG tablet Take 1 tablet (0.25 mg total) by mouth at bedtime as needed for sleep or anxiety.  60 tablet  1  . Bromfenac Sodium (BROMDAY) 0.09 % (DAILY) SOLN Apply 1 drop to eye.        . Cholecalciferol (VITAMIN D3) 2000 UNITS capsule Take 2,000 Units by mouth daily.        Marland Kitchen COUMADIN 2.5 MG tablet TAKE AS DIRECTED BY ANTICOAGULATION CLINIC. PT TAKES UP TO 2 TABLETS DAILY.  160 each  1  . cycloSPORINE (RESTASIS) 0.05 % ophthalmic emulsion Place 1 drop into both eyes daily.        . folic acid (FOLVITE) 1 MG tablet Take 1 mg by mouth 2 (two) times daily.        . hydroxychloroquine (PLAQUENIL) 200 MG tablet Take 200 mg by mouth 2 (two) times daily.        Marland Kitchen ibandronate (BONIVA) 150 MG tablet Take 150 mg by mouth every 30 (thirty) days. Take in the morning with a full glass of water, on an empty stomach,  and do not take anything else by mouth or lie down for the next 30 min.      . methotrexate (RHEUMATREX) 2.5 MG tablet Take 20 mg by mouth once a week. Caution:Chemotherapy. Protect from light. 8 tablets weekly.      . predniSONE (DELTASONE) 1 MG tablet Take 2 mg by mouth daily. 1.5mg  daily      . SUMAtriptan (IMITREX) 50 MG tablet TAKE ONE TABLET BY MOUTH AS NEEDED. MAY REPEAT IN 2 TO 4 HOURS IF NEEDED.  9 tablet  1  . urea (CARMOL) 40 % CREA Apply topically twice daily  85 each  3  . DISCONTD: ALPRAZolam (XANAX) 0.25 MG tablet TAKE ONE TABLET BY MOUTH EVERY DAY  30 tablet  1  . DISCONTD: FORTEO 600 MCG/2.4ML SOLN as directed.      Marland Kitchen DISCONTD: mineral/vitamin supplement (MULTIGEN) 70 MG TABS Take 1 tablet (70 mg total) by mouth daily.  30 each  4  . DISCONTD: NASCOBAL 500 MCG/0.1ML SOLN USE 50 MCG AS DIRECTED INTO NOSE  2 mL  2   Past history family history social history  reviewed in the electronic medical record.      Objective:   Physical Exam BP 96/64  Pulse 78  Temp 98 F (36.7 C) (Oral)  Wt 157 lb (71.215 kg)  SpO2 98%  WDWN in nad   Neck: Supple without adenopathy or masses or bruits Chest:  Clear to A&P without wheezes rales or rhonchi CV:  S1-S2 no gallops or murmurs peripheral perfusion is normal Abdomen:  Sof,t normal bowel sounds without hepatosplenomegaly, no guarding rebound or masses no CVA tenderness  right hip good rom points to lateral abd muscle area and near pelvis no  Hernia gait wnl.  Iron and b12 good and cbcdiff nl. Labs reviewed     Assessment & Plan:  b12 defic  Iron defic  Replaced  RA under care Anticoagulation Anxiety prn meds  Ok to refill medication Osteoporosis bone health   On boniva again with care ocass right side hip area pain  Uncertain significance  And  Normal exam  Follow. Caution with alternative med as no a lot of credible evidence for safety or efficacy  . " Pills are not natural. " Total visit > 50% spent counseling and  coordinating care

## 2011-11-19 ENCOUNTER — Ambulatory Visit: Payer: BC Managed Care – PPO | Admitting: Internal Medicine

## 2011-11-24 ENCOUNTER — Other Ambulatory Visit (INDEPENDENT_AMBULATORY_CARE_PROVIDER_SITE_OTHER): Payer: BC Managed Care – PPO

## 2011-11-24 ENCOUNTER — Other Ambulatory Visit: Payer: Self-pay | Admitting: Family Medicine

## 2011-11-24 ENCOUNTER — Telehealth: Payer: Self-pay | Admitting: Family Medicine

## 2011-11-24 DIAGNOSIS — R309 Painful micturition, unspecified: Secondary | ICD-10-CM

## 2011-11-24 DIAGNOSIS — R300 Dysuria: Secondary | ICD-10-CM

## 2011-11-24 DIAGNOSIS — R109 Unspecified abdominal pain: Secondary | ICD-10-CM

## 2011-11-24 DIAGNOSIS — R350 Frequency of micturition: Secondary | ICD-10-CM

## 2011-11-24 LAB — POCT URINALYSIS DIPSTICK
Bilirubin, UA: NEGATIVE
Glucose, UA: NEGATIVE
Ketones, UA: NEGATIVE
Nitrite, UA: NEGATIVE
pH, UA: 7

## 2011-11-24 MED ORDER — CIPROFLOXACIN HCL 500 MG PO TABS: 500.0000 mg | ORAL_TABLET | Freq: Two times a day (BID) | ORAL | Status: DC

## 2011-11-24 NOTE — Telephone Encounter (Signed)
Needs a urine and culture today  Either ov or drop off the urine and then we can decide o i would add antibiotic and then a follow up . Visit depending on result. ( Antibiotics can change the  Coumadin level also. )

## 2011-11-24 NOTE — Telephone Encounter (Signed)
Final results are in the computer.  Please advise what medication to send to the pharmacy.

## 2011-11-24 NOTE — Telephone Encounter (Signed)
Pt sx consist of painful and frequent urination.  She has no fever.  Complains of fatigue.  Also has abdominal pain.  Started Saturday.  She tried to reach her urologist but was unable to make contact.  Would you like to call in antibiotic?  H:  [id number] C:  [id number]

## 2011-11-24 NOTE — Telephone Encounter (Signed)
The pt was notified by telephone.  Rx was sent to the pharmacy.

## 2011-11-24 NOTE — Telephone Encounter (Signed)
Send in cipro 500 1 po bid for 3 days .  FU OV at end of med or as needed.

## 2011-11-25 ENCOUNTER — Telehealth: Payer: Self-pay | Admitting: Cardiology

## 2011-11-25 NOTE — Telephone Encounter (Signed)
Pt called to report she is going to be taking Cipro x 3 days.  Informed pt this may increase INR slightly.  She has an appt set up for 7/17 already.  Will keep that appt and have her eat a few extra servings of greens while on abx.

## 2011-11-25 NOTE — Telephone Encounter (Signed)
New Problem:    Patient called in wanting to let you know that she is taking a new antibiotic and wanted to consult with someone about this.  Please call back.

## 2011-11-28 ENCOUNTER — Ambulatory Visit (INDEPENDENT_AMBULATORY_CARE_PROVIDER_SITE_OTHER): Payer: BC Managed Care – PPO | Admitting: Internal Medicine

## 2011-11-28 ENCOUNTER — Encounter: Payer: Self-pay | Admitting: Internal Medicine

## 2011-11-28 VITALS — BP 116/76 | HR 82 | Temp 98.3°F | Wt 157.0 lb

## 2011-11-28 DIAGNOSIS — N390 Urinary tract infection, site not specified: Secondary | ICD-10-CM

## 2011-11-28 DIAGNOSIS — R109 Unspecified abdominal pain: Secondary | ICD-10-CM | POA: Insufficient documentation

## 2011-11-28 HISTORY — DX: Urinary tract infection, site not specified: N39.0

## 2011-11-28 LAB — POCT URINALYSIS DIP (MANUAL ENTRY)
Bilirubin, UA: NEGATIVE
Glucose, UA: NEGATIVE
Nitrite, UA: NEGATIVE
Urobilinogen, UA: 0.2

## 2011-11-28 MED ORDER — CIPROFLOXACIN HCL 500 MG PO TABS: 500.0000 mg | ORAL_TABLET | Freq: Two times a day (BID) | ORAL | Status: AC

## 2011-11-28 NOTE — Progress Notes (Signed)
  Subjective:    Patient ID: [name], female    DOB: November 14, 1960, 51 y.o.   MRN: [id number] HPI Pt comesin for fu after rx over the phone for uti. Refer to  the phone note or she had the onset 4-5 days ago of dysuria without associated fever or back pain. She called in the urology office but couldn't get in touch with them so we did a urinalysis and a culture and called in Cipro 500 twice a day for 3 days. Since that time she is significantly improved this morning was better no fever still has a little dysuria. Denies any specific abdominal pain  However the area of tenderness or pain near her right iliac crest is continuing thinks she feels a knot when she stands up it is tender gets some pain radiate around to her back. Unsure if this is related. Wonders if it's related to the Boniva Review of Systems Negative for fever stop the methotrexate on the antibiotic no bowel changes. No bleeding. Past history family history social history reviewed in the electronic medical record.    REVIEWED meds. Objective:   Physical Exam BP 116/76  Pulse 82  Temp 98.3 F (36.8 C) (Oral)  Wt 157 lb (71.215 kg)  SpO2 98% Well-developed well-nourished in no acute distress examination of the abdomen while she is laying down she has no masses that I can find but points to the area of the lateral iliac crest and abdomen when she stands there seems to be a superficial 2 cm nodular-like structure an area of tenderness. Minimal suprapubic pain no flank pain. Her gait is within normal limits.   Repeat urinalysis does show leukocytes+1 Urine culture from earlier this week showed a Citrobacter pansensitive except for Macrobid.    Assessment & Plan:  UTI citrobacter no systemic symptoms still has some mild symptoms we'll refill worse Cipro for another 3 days call us first thing in the week I want her to followup with urology.  Right lateral abd groin area pain?  causeSee last note  Small lump about 2 cm when  standing? Cannot feel when laying  Seems lateral  consdier urologic pain .  Vs hernia vs other  Many ?s    First rx uti effective   Fu with urology ? Is there uro cause of this pain. Then plan follow up if  persistent or progressive consider seeing  Surgeon to check area  Vs other  Imaging if needed.   send copy of note and cx to uro and theum

## 2011-11-28 NOTE — Patient Instructions (Addendum)
We can add  3 more days of the antibiotic.  We'll send a copy of note and lab report to urologist and I recommend you followup appointment with them. An opinion on the right lower lateral pain that you're having and have them give opinion if they think any urologic condition could be causing this discomfort.  If this pain continues and it has not felt to be urologic have you get back with me and we will make a next step. I cannot tell if that small lump in your standing could be a hernia. Sometimes kidney or urologic pain begins in the back and goes around to the front groin area.

## 2011-12-01 ENCOUNTER — Telehealth: Payer: Self-pay | Admitting: *Deleted

## 2011-12-01 NOTE — Telephone Encounter (Signed)
Pt called to inform us that she took Cipro for 6 days with last dose being on yesterday. Also Trimethoprim. Using Cranberry juice powder concentrate and Vitamin C As well. She states she adjusted her Saturday's dose by a half and will adjust today's. Educated on not adjusting dose until we see her in clinic at her appt but she states she feel more comfortable taking a smaller dose.

## 2011-12-03 ENCOUNTER — Ambulatory Visit (INDEPENDENT_AMBULATORY_CARE_PROVIDER_SITE_OTHER): Payer: BC Managed Care – PPO | Admitting: *Deleted

## 2011-12-03 DIAGNOSIS — R760 Raised antibody titer: Secondary | ICD-10-CM

## 2011-12-03 DIAGNOSIS — Z7901 Long term (current) use of anticoagulants: Secondary | ICD-10-CM

## 2011-12-03 DIAGNOSIS — I2699 Other pulmonary embolism without acute cor pulmonale: Secondary | ICD-10-CM

## 2011-12-03 LAB — POCT INR: INR: 1.2

## 2011-12-17 ENCOUNTER — Ambulatory Visit (INDEPENDENT_AMBULATORY_CARE_PROVIDER_SITE_OTHER): Payer: BC Managed Care – PPO | Admitting: *Deleted

## 2011-12-17 DIAGNOSIS — I2699 Other pulmonary embolism without acute cor pulmonale: Secondary | ICD-10-CM

## 2011-12-17 DIAGNOSIS — R760 Raised antibody titer: Secondary | ICD-10-CM

## 2011-12-17 DIAGNOSIS — Z7901 Long term (current) use of anticoagulants: Secondary | ICD-10-CM

## 2011-12-17 DIAGNOSIS — R894 Abnormal immunological findings in specimens from other organs, systems and tissues: Secondary | ICD-10-CM

## 2011-12-17 LAB — POCT INR: INR: 1.6

## 2011-12-25 ENCOUNTER — Ambulatory Visit (INDEPENDENT_AMBULATORY_CARE_PROVIDER_SITE_OTHER): Payer: BC Managed Care – PPO | Admitting: Psychology

## 2011-12-25 DIAGNOSIS — F411 Generalized anxiety disorder: Secondary | ICD-10-CM

## 2012-01-08 ENCOUNTER — Ambulatory Visit (INDEPENDENT_AMBULATORY_CARE_PROVIDER_SITE_OTHER): Payer: BC Managed Care – PPO | Admitting: *Deleted

## 2012-01-08 DIAGNOSIS — Z7901 Long term (current) use of anticoagulants: Secondary | ICD-10-CM

## 2012-01-08 DIAGNOSIS — R760 Raised antibody titer: Secondary | ICD-10-CM

## 2012-01-08 DIAGNOSIS — I2699 Other pulmonary embolism without acute cor pulmonale: Secondary | ICD-10-CM

## 2012-01-08 DIAGNOSIS — R894 Abnormal immunological findings in specimens from other organs, systems and tissues: Secondary | ICD-10-CM

## 2012-01-08 LAB — POCT INR: INR: 1.6

## 2012-01-16 ENCOUNTER — Other Ambulatory Visit: Payer: Self-pay | Admitting: Cardiology

## 2012-01-23 ENCOUNTER — Encounter: Payer: Self-pay | Admitting: Internal Medicine

## 2012-01-23 ENCOUNTER — Ambulatory Visit (INDEPENDENT_AMBULATORY_CARE_PROVIDER_SITE_OTHER): Payer: BC Managed Care – PPO | Admitting: Internal Medicine

## 2012-01-23 VITALS — BP 104/74 | HR 64 | Temp 98.0°F | Wt 158.0 lb

## 2012-01-23 DIAGNOSIS — M898X8 Other specified disorders of bone, other site: Secondary | ICD-10-CM | POA: Insufficient documentation

## 2012-01-23 DIAGNOSIS — R894 Abnormal immunological findings in specimens from other organs, systems and tissues: Secondary | ICD-10-CM

## 2012-01-23 DIAGNOSIS — F4322 Adjustment disorder with anxiety: Secondary | ICD-10-CM

## 2012-01-23 DIAGNOSIS — N952 Postmenopausal atrophic vaginitis: Secondary | ICD-10-CM | POA: Insufficient documentation

## 2012-01-23 DIAGNOSIS — R760 Raised antibody titer: Secondary | ICD-10-CM

## 2012-01-23 DIAGNOSIS — Z7901 Long term (current) use of anticoagulants: Secondary | ICD-10-CM

## 2012-01-23 DIAGNOSIS — M899 Disorder of bone, unspecified: Secondary | ICD-10-CM

## 2012-01-23 DIAGNOSIS — M810 Age-related osteoporosis without current pathological fracture: Secondary | ICD-10-CM

## 2012-01-23 DIAGNOSIS — Z87440 Personal history of urinary (tract) infections: Secondary | ICD-10-CM

## 2012-01-23 NOTE — Progress Notes (Signed)
Subjective:    Patient ID: [name], female    DOB: 17-Jun-1960, 51 y.o.   MRN: [id number]  HPI Patient comes in today for followup of medical problems. Since her last visit she has had the discomfort in her right iliac keep area evaluate it. She has seen the urologist and was noted to have recurrent urinary infections and is now on suppressive therapy for about 3 months. She asks her opinion of this. She saw her gynecologist an ultrasound was negative and her discomfort was not felt to be gynecologic in nature. She was noted however to have significant vaginal dryness but was not advised to take topical estrogen because of her history of pulmonary embolus and hypercoagulable state although she is on Coumadin. She is also seeing her rheumatologist who felt she was doing fairly well and did an X ray  Right area    Hip area and told it was okay and referred for physical therapy for DX  iliac strain.  Dr Durenda Age  . She asked about an MRI and it was recommended she do physical therapy first  Patient is asking my opinion about this there was a question of whether she could have a hernia in this area and that consideration of consultation with a surgeon. The discomfort comes and goes is near her right iliac crest and radiates sometimes down into the groin and perhaps the anterior leg. Walking makes it better sitting makes it worse. She doesn't have a limp. Review of Systems Negative currently for chest pain shortness of breath syncope she stopped taking the Boniva because she thinks it's possible it could relate to her urinary problems. In the package insert urinary dysfunction is listed. Her rheumatologist wants her to take Forteo which she is still reluctant to do this at this time she will be stopping her Boniva temporarily.  Past history family history social history reviewed in the electronic medical record. Outpatient Encounter Prescriptions as of 01/23/2012  Medication Sig Dispense Refill    . ALPRAZolam (XANAX) 0.25 MG tablet Take 1 tablet (0.25 mg total) by mouth at bedtime as needed for sleep or anxiety.  60 tablet  1  . Bromfenac Sodium (BROMDAY) 0.09 % (DAILY) SOLN Apply 1 drop to eye.        . Cholecalciferol (VITAMIN D3) 2000 UNITS capsule Take 2,000 Units by mouth daily.        Marland Kitchen COUMADIN 2.5 MG tablet TAKE UP TO 2 TABLETS DAILY OR AS DIRECTED  150 each  0  . cycloSPORINE (RESTASIS) 0.05 % ophthalmic emulsion Place 1 drop into both eyes daily.        . folic acid (FOLVITE) 1 MG tablet Take 1 mg by mouth 2 (two) times daily.        . hydroxychloroquine (PLAQUENIL) 200 MG tablet Take 200 mg by mouth 2 (two) times daily.        Marland Kitchen ibandronate (BONIVA) 150 MG tablet Take 150 mg by mouth every 30 (thirty) days. Take in the morning with a full glass of water, on an empty stomach, and do not take anything else by mouth or lie down for the next 30 min.      . methotrexate (RHEUMATREX) 2.5 MG tablet Take 20 mg by mouth once a week. Caution:Chemotherapy. Protect from light. 8 tablets weekly.      . mineral/vitamin supplement (MULTIGEN) 70 MG TABS Take 1 tablet (70 mg total) by mouth daily.  30 each  11  . NASCOBAL 500 MCG/0.1ML SOLN  USE 50 MCG AS DIRECTED INTO NOSE  2 mL  11  . predniSONE (DELTASONE) 1 MG tablet Take 2 mg by mouth daily. 1.5mg  daily      . SUMAtriptan (IMITREX) 50 MG tablet TAKE ONE TABLET BY MOUTH AS NEEDED. MAY REPEAT IN 2 TO 4 HOURS IF NEEDED.  9 tablet  1  . trimethoprim (TRIMPEX) 100 MG tablet Take 100 mg by mouth as directed.      . urea (CARMOL) 40 % CREA Apply topically twice daily  85 each  3       Objective:   Physical Exam BP 104/74  Pulse 64  Temp 98 F (36.7 C) (Oral)  Wt 158 lb (71.668 kg)  SpO2 97%  Well-developed well-nourished in no acute distress good eye contact only mildly anxious today. Reviewed data with her she is copies of her records but not her x-ray report. Examination of the abdomen shows no obvious hernia standing laying she  points to the inferior iliac area in the lateral groin I don't feel mass or hernia today gait is within normal limits.     Assessment & Plan:  Intermittent right iliac groin pain Seems to be related to the hip girdle but also the iliac crest attachment. I told her it was reasonable to get physical therapy as that might help; reassured that it shouldn't harm her and if it is persistent to get back with Dr. Durenda Age   consider other evaluation. Patient asks about an MRI and I agree with dermatology evaluation assuming her x-ray was unrevealing of significant pathology. Decreased pain with walking would be atypical for insufficiency fracture.  Bladder / recurrent UTI; Getting better on suppressive therapy ;uncertain if vaginal atrophy contributing  Vaginal atrophy high risk for clotting  discussed that if we are having a recurrent problem and the urologist thinks it would help she should discuss with hematology about risk of clotting if used some topical estrogen well she remains on Coumadin.  At this time however she can hold tight until she sees how the suppressive therapy works.  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  Total visit > 50% spent counseling and coordinating care  Will send note to dr D

## 2012-01-23 NOTE — Patient Instructions (Signed)
At this time  I agree with proceed to the PT  .   Then get back with Dr Durenda Age   If not getting better  .   About further work up  I dont think this is a hernia at this point. If vaginal  estrogen could help prevent the utis  Get opinion from hematology about  Risk  Usually less absorption with the creams and you are on prevention coumadin.

## 2012-02-05 ENCOUNTER — Ambulatory Visit (INDEPENDENT_AMBULATORY_CARE_PROVIDER_SITE_OTHER): Payer: BC Managed Care – PPO | Admitting: *Deleted

## 2012-02-05 DIAGNOSIS — R760 Raised antibody titer: Secondary | ICD-10-CM

## 2012-02-05 DIAGNOSIS — R894 Abnormal immunological findings in specimens from other organs, systems and tissues: Secondary | ICD-10-CM

## 2012-02-05 DIAGNOSIS — Z7901 Long term (current) use of anticoagulants: Secondary | ICD-10-CM

## 2012-02-05 DIAGNOSIS — I2699 Other pulmonary embolism without acute cor pulmonale: Secondary | ICD-10-CM

## 2012-02-09 ENCOUNTER — Other Ambulatory Visit: Payer: Self-pay | Admitting: Obstetrics and Gynecology

## 2012-02-09 DIAGNOSIS — Z1231 Encounter for screening mammogram for malignant neoplasm of breast: Secondary | ICD-10-CM

## 2012-03-04 ENCOUNTER — Ambulatory Visit (INDEPENDENT_AMBULATORY_CARE_PROVIDER_SITE_OTHER): Payer: BC Managed Care – PPO | Admitting: General Practice

## 2012-03-04 DIAGNOSIS — R760 Raised antibody titer: Secondary | ICD-10-CM

## 2012-03-04 DIAGNOSIS — I2699 Other pulmonary embolism without acute cor pulmonale: Secondary | ICD-10-CM

## 2012-03-04 DIAGNOSIS — Z7901 Long term (current) use of anticoagulants: Secondary | ICD-10-CM

## 2012-03-04 DIAGNOSIS — R894 Abnormal immunological findings in specimens from other organs, systems and tissues: Secondary | ICD-10-CM

## 2012-03-17 ENCOUNTER — Ambulatory Visit
Admission: RE | Admit: 2012-03-17 | Discharge: 2012-03-17 | Disposition: A | Discharge status: Home / Self Care | Payer: BC Managed Care – PPO | Source: Ambulatory Visit | Attending: Obstetrics and Gynecology | Admitting: Obstetrics and Gynecology

## 2012-03-17 DIAGNOSIS — Z1231 Encounter for screening mammogram for malignant neoplasm of breast: Secondary | ICD-10-CM

## 2012-03-19 ENCOUNTER — Other Ambulatory Visit: Payer: Self-pay | Admitting: Obstetrics and Gynecology

## 2012-03-19 DIAGNOSIS — R928 Other abnormal and inconclusive findings on diagnostic imaging of breast: Secondary | ICD-10-CM

## 2012-03-30 ENCOUNTER — Ambulatory Visit
Admission: RE | Admit: 2012-03-30 | Discharge: 2012-03-30 | Disposition: A | Discharge status: Home / Self Care | Payer: BC Managed Care – PPO | Source: Ambulatory Visit | Attending: Obstetrics and Gynecology | Admitting: Obstetrics and Gynecology

## 2012-03-30 DIAGNOSIS — R928 Other abnormal and inconclusive findings on diagnostic imaging of breast: Secondary | ICD-10-CM

## 2012-04-01 ENCOUNTER — Ambulatory Visit (INDEPENDENT_AMBULATORY_CARE_PROVIDER_SITE_OTHER): Payer: BC Managed Care – PPO | Admitting: General Practice

## 2012-04-01 ENCOUNTER — Other Ambulatory Visit: Payer: Self-pay | Admitting: General Practice

## 2012-04-01 DIAGNOSIS — R760 Raised antibody titer: Secondary | ICD-10-CM

## 2012-04-01 DIAGNOSIS — R894 Abnormal immunological findings in specimens from other organs, systems and tissues: Secondary | ICD-10-CM

## 2012-04-01 DIAGNOSIS — I2699 Other pulmonary embolism without acute cor pulmonale: Secondary | ICD-10-CM

## 2012-04-01 DIAGNOSIS — Z7901 Long term (current) use of anticoagulants: Secondary | ICD-10-CM

## 2012-04-01 LAB — POCT INR: INR: 1.5

## 2012-04-01 MED ORDER — COUMADIN 2.5 MG PO TABS: 2.5000 mg | ORAL_TABLET | Freq: Every day | ORAL | Status: DC

## 2012-04-12 ENCOUNTER — Encounter: Payer: Self-pay | Admitting: Internal Medicine

## 2012-04-12 ENCOUNTER — Ambulatory Visit (INDEPENDENT_AMBULATORY_CARE_PROVIDER_SITE_OTHER): Payer: BC Managed Care – PPO | Admitting: Internal Medicine

## 2012-04-12 VITALS — BP 100/60 | HR 73 | Temp 97.6°F | Wt 160.0 lb

## 2012-04-12 DIAGNOSIS — M069 Rheumatoid arthritis, unspecified: Secondary | ICD-10-CM

## 2012-04-12 DIAGNOSIS — R928 Other abnormal and inconclusive findings on diagnostic imaging of breast: Secondary | ICD-10-CM | POA: Insufficient documentation

## 2012-04-12 DIAGNOSIS — F4322 Adjustment disorder with anxiety: Secondary | ICD-10-CM

## 2012-04-12 DIAGNOSIS — Z86718 Personal history of other venous thrombosis and embolism: Secondary | ICD-10-CM

## 2012-04-12 DIAGNOSIS — R894 Abnormal immunological findings in specimens from other organs, systems and tissues: Secondary | ICD-10-CM

## 2012-04-12 DIAGNOSIS — Z7901 Long term (current) use of anticoagulants: Secondary | ICD-10-CM

## 2012-04-12 DIAGNOSIS — R760 Raised antibody titer: Secondary | ICD-10-CM

## 2012-04-12 NOTE — Patient Instructions (Signed)
It is reasonable to get a surgery opinion  about how to followup on the ultrasound  Findings of probably benign cysts.

## 2012-04-12 NOTE — Progress Notes (Signed)
Chief Complaint  Patient presents with  . Follow-up    Here to discuss mammogram    HPI: Patient comes in today  for  new problem evaluation. Had mammogram and had call back for dg and then US done left breast finding 2  9 and 5 mm nodules non palpable on [place] near anchechogenic character  And dx as "probable benign  cyst "and advise close 6 month fu. ? If follow up should be sooner or other advice to ease her mind. Has a hx f secondary anxiety and wishes help with more information.  ROS: See pertinent positives and negatives per HPI. Family hx neg for breast cancer in first degree relative  Mom died lymphoma  Past Medical History  Diagnosis Date  . Chest pain, unspecified   . Palpitations   . Personal history of venous thrombosis and embolism   . Chronic anticoagulation   . Adjustment disorder with anxiety   . Other and unspecified hyperlipidemia   . B12 deficiency due to diet     is a vegetarian  . Unspecified vitamin D deficiency   . Otalgia, unspecified   . Iron deficiency anemia, unspecified   . Rheumatoid arthritis     [name]  . Headache     migraines and head pain  . Hx of varicella     As Child    Family History  Problem Relation Age of Onset  . Dementia Father   . Stroke Father   . Hyperlipidemia Father   . Diabetes Father   . Parkinsonism Mother   . Lymphoma      History   Social History  . Marital Status: Married    Spouse Name: N/A    Number of Children: N/A  . Years of Education: N/A   Social History Main Topics  . Smoking status: Never Smoker   . Smokeless tobacco: None  . Alcohol Use: No  . Drug Use: No  . Sexually Active: None   Other Topics Concern  . None   Social History Narrative   Regular Exercise-no20 yrs in [place] at [employer].Married HH  of 3 G1P1    Outpatient Encounter Prescriptions as of 04/12/2012  Medication Sig Dispense Refill  . ALPRAZolam (XANAX) 0.25 MG tablet Take 1 tablet (0.25 mg total) by mouth at  bedtime as needed for sleep or anxiety.  60 tablet  1  . Bromfenac Sodium (BROMDAY) 0.09 % (DAILY) SOLN Apply 1 drop to eye.        . Cholecalciferol (VITAMIN D3) 2000 UNITS capsule Take 2,000 Units by mouth daily.        Marland Kitchen COUMADIN 2.5 MG tablet Take 1 tablet (2.5 mg total) by mouth daily. Take as directed by coumadin clinic.  150 each  0  . cycloSPORINE (RESTASIS) 0.05 % ophthalmic emulsion Place 1 drop into both eyes daily.        . folic acid (FOLVITE) 1 MG tablet Take 1 mg by mouth 2 (two) times daily.        . hydroxychloroquine (PLAQUENIL) 200 MG tablet Take 200 mg by mouth 2 (two) times daily.        . methotrexate (RHEUMATREX) 2.5 MG tablet Take 20 mg by mouth once a week. Caution:Chemotherapy. Protect from light. 8 tablets weekly.      . mineral/vitamin supplement (MULTIGEN) 70 MG TABS Take 1 tablet (70 mg total) by mouth daily.  30 each  11  . NASCOBAL 500 MCG/0.1ML SOLN USE 50 MCG AS DIRECTED  INTO NOSE  2 mL  11  . predniSONE (DELTASONE) 1 MG tablet Take 2 mg by mouth daily. 1.5mg  daily      . SUMAtriptan (IMITREX) 50 MG tablet TAKE ONE TABLET BY MOUTH AS NEEDED. MAY REPEAT IN 2 TO 4 HOURS IF NEEDED.  9 tablet  1  . trimethoprim (TRIMPEX) 100 MG tablet Take 100 mg by mouth as directed.      . urea (CARMOL) 40 % CREA Apply topically twice daily  85 each  3  . ibandronate (BONIVA) 150 MG tablet Take 150 mg by mouth every 30 (thirty) days. Take in the morning with a full glass of water, on an empty stomach, and do not take anything else by mouth or lie down for the next 30 min.        EXAM:  BP 100/60  Pulse 73  Temp 97.6 F (36.4 C) (Oral)  Wt 160 lb (72.576 kg)  SpO2 98%  There is no height on file to calculate BMI.  GENERAL: vitals reviewed and listed above, alert, oriented, appears well hydrated and in no acute distress  HEENT: atraumatic, conjunctiva  clear, no obvious abnormalities on inspection of external nose and ears OP : no lesion edema or exudate   NECK: no  obvious masses on inspection palpation   LUNGS: clear to auscultation bilaterally, no wheezes, rales or rhonchi, good air movement  CV: HRRR, no clubbing cyanosis or  peripheral edema nl cap refill   MS: moves all extremities without noticeable focal  abnormality  PSYCH: pleasant and cooperative, no obvious depression  normal anxiety for her baseline. reviewed reports . ASSESSMENT AND PLAN:  Discussed the following assessment and plan:  1. Mammogram abnormal    [place] prob benign cyst but not defined  secondary anxiety reagarding this will get surgical opinion.  2. Rheumatoid arthritis   3. Anticardiolipin antibody positive   4. Adjustment disorder with anxiety   5. PULMONARY EMBOLISM, HX OF   6. Chronic anticoagulation     -Patient advised to return or notify health care team  immediately if symptoms worsen or persist or new concerns arise.  Patient Instructions  It is reasonable to get a surgery opinion  about how to followup on the ultrasound  Findings of probably benign cysts.    Neta Mends. Amyrie Illingworth M.D.  Total visit > 50% spent counseling and coordinating care

## 2012-04-22 ENCOUNTER — Telehealth: Payer: Self-pay | Admitting: Internal Medicine

## 2012-04-22 NOTE — Telephone Encounter (Signed)
Pt called and has question re: the referral to Dr Dwain Sarna? Need to know if Dr Fabian Sharp recommended this doctor or is this just the first doctor that was avail. Pt said that she is willing to wait and see a specialist that Dr Fabian Sharp chooses. Pls call.

## 2012-04-22 NOTE — Telephone Encounter (Signed)
Is there someone you would like to send her to?

## 2012-04-26 ENCOUNTER — Telehealth: Payer: Self-pay | Admitting: Family Medicine

## 2012-04-26 ENCOUNTER — Other Ambulatory Visit: Payer: Self-pay | Admitting: Internal Medicine

## 2012-04-26 ENCOUNTER — Encounter (INDEPENDENT_AMBULATORY_CARE_PROVIDER_SITE_OTHER): Payer: Self-pay | Admitting: General Surgery

## 2012-04-26 ENCOUNTER — Ambulatory Visit (INDEPENDENT_AMBULATORY_CARE_PROVIDER_SITE_OTHER): Payer: BC Managed Care – PPO | Admitting: General Surgery

## 2012-04-26 ENCOUNTER — Other Ambulatory Visit (INDEPENDENT_AMBULATORY_CARE_PROVIDER_SITE_OTHER): Payer: Self-pay | Admitting: General Surgery

## 2012-04-26 VITALS — BP 134/62 | HR 77 | Temp 97.7°F | Resp 18 | Ht 64.0 in | Wt 157.4 lb

## 2012-04-26 DIAGNOSIS — R928 Other abnormal and inconclusive findings on diagnostic imaging of breast: Secondary | ICD-10-CM

## 2012-04-26 DIAGNOSIS — N6009 Solitary cyst of unspecified breast: Secondary | ICD-10-CM

## 2012-04-26 NOTE — Patient Instructions (Signed)
Breast pain Use hot or cold compresses on your breasts. Wear a firm support bra fitted by a professional if possible Wear a sports bra during exercise especially when your breasts may be more sensitive. Experiment with relaxation therapy which can help control the high levels of anxiety associated with severe breast pain. Limit or eliminate caffeine a dietary change some women find helpful, although medical studies of caffeine's effect on breast pain and other premenstrual symptoms have been inconclusive Follow a low-fat diet and eat more complex carbohydrates, a strategy that's helped some women with breast pain in observational studies. Consider using an over-the-counter pain reliever,such as acetaminophen (Tylenol, others) or ibuprofen (Advil, Motrin IB, others)  Keep a journal noting when you experience breast pain and other symptoms, to determine if your pain is cyclic or noncyclic

## 2012-04-26 NOTE — Telephone Encounter (Signed)
Sent message to [facility].  Waiting on reply.

## 2012-04-26 NOTE — Progress Notes (Signed)
Patient ID: [name], female   DOB: 06/19/1960, 51 y.o.   MRN: [id number]  Chief Complaint  Patient presents with  . New Evaluation    Breast    HPI [name] is a 51 y.o. female.  Referred by Dr. Fabian Sharp HPI This is a 51 year old female who was originally from [place] who has a family history of breast cancer in an aunt who is a half sister had breast cancer 62s. She has had no prior breast history. She underwent her first 3D mammogram this year and was found to have 2 nodules in the 6 and 7:00 region of the left breast measuring 9 and 5 mm. There are no associated microcalcifications. Had an ultrasound which showed a near anechoic lesion in the 6 to 7:00 portion of the left breast 6 cm in the nipple measuring 5 x 3 x 4 mm. Medial to this area there is another smaller lesion measuring 3 x 2 x 3 mm. These were noted to be probable left breast cysts and recommended a short-term followup with ultrasound in 6 months. She is very anxious about this and comes in today to discuss any possible other therapy including biopsy or resection. She also states that she has had heaviness and swelling in  her breasts since her mammogram. She does not have any nipple discharge and cannot identify any masses at all. She also has some pain underneath her right arm a little bit as well. She has been wearing a loosefitting bra recently. She does not have a high caffeine intake and is really not changed a lot of her medications recently at all either. She's been on no HRT either. Past Medical History  Diagnosis Date  . Chest pain, unspecified   . Palpitations   . Personal history of venous thrombosis and embolism   . Chronic anticoagulation   . Adjustment disorder with anxiety   . Other and unspecified hyperlipidemia   . B12 deficiency due to diet     is a vegetarian  . Unspecified vitamin D deficiency   . Otalgia, unspecified   . Iron deficiency anemia, unspecified   . Rheumatoid arthritis    deveshewar  . Headache     migraines and head pain  . Hx of varicella     As Child    Past Surgical History  Procedure Date  . Tympanostomy tube placement 2009    Right    Family History  Problem Relation Age of Onset  . Dementia Father   . Stroke Father   . Hyperlipidemia Father   . Diabetes Father   . Parkinsonism Mother   . Lymphoma      Social History History  Substance Use Topics  . Smoking status: Never Smoker   . Smokeless tobacco: Not on file  . Alcohol Use: No    Allergies  Allergen Reactions  . Boniva (Ibandronate Sodium)     Heartburn  . Ivp Dye (Iodinated Diagnostic Agents) Swelling    Mouth swelling.  . Risedronate Sodium (Risedronate Sodium) Other (See Comments)    Foot and bone pain and fatigue    Current Outpatient Prescriptions  Medication Sig Dispense Refill  . ALPRAZolam (XANAX) 0.25 MG tablet Take 1 tablet (0.25 mg total) by mouth at bedtime as needed for sleep or anxiety.  60 tablet  1  . Cholecalciferol (VITAMIN D3) 2000 UNITS capsule Take 2,000 Units by mouth daily.        Marland Kitchen COUMADIN 2.5 MG tablet Take 1 tablet (2.5  mg total) by mouth daily. Take as directed by coumadin clinic.  150 each  0  . cycloSPORINE (RESTASIS) 0.05 % ophthalmic emulsion Place 1 drop into both eyes daily.        . folic acid (FOLVITE) 1 MG tablet Take 1 mg by mouth 2 (two) times daily.        . hydroxychloroquine (PLAQUENIL) 200 MG tablet Take 200 mg by mouth 2 (two) times daily.        Marland Kitchen loteprednol (LOTEMAX) 0.2 % SUSP 1 drop 4 (four) times daily.      . methotrexate (RHEUMATREX) 2.5 MG tablet Take 20 mg by mouth once a week. Caution:Chemotherapy. Protect from light. 8 tablets weekly.      . mineral/vitamin supplement (MULTIGEN) 70 MG TABS Take 1 tablet (70 mg total) by mouth daily.  30 each  11  . NASCOBAL 500 MCG/0.1ML SOLN USE 50 MCG AS DIRECTED INTO NOSE  2 mL  11  . predniSONE (DELTASONE) 1 MG tablet Take 2 mg by mouth daily. 1.5mg  daily      . SUMAtriptan  (IMITREX) 50 MG tablet TAKE ONE TABLET BY MOUTH AS NEEDED. MAY REPEAT IN 2 TO 4 HOURS IF NEEDED.  9 tablet  1  . urea (CARMOL) 40 % CREA Apply topically twice daily  85 each  3  . Bromfenac Sodium (BROMDAY) 0.09 % (DAILY) SOLN Apply 1 drop to eye.        . ibandronate (BONIVA) 150 MG tablet Take 150 mg by mouth every 30 (thirty) days. Take in the morning with a full glass of water, on an empty stomach, and do not take anything else by mouth or lie down for the next 30 min.      Marland Kitchen trimethoprim (TRIMPEX) 100 MG tablet Take 100 mg by mouth as directed.        Review of Systems Review of Systems  Constitutional: Negative for fever, chills and unexpected weight change.  HENT: Negative for hearing loss, congestion, sore throat, trouble swallowing and voice change.   Eyes: Negative for visual disturbance.  Respiratory: Negative for cough and wheezing.   Cardiovascular: Negative for chest pain, palpitations and leg swelling.  Gastrointestinal: Negative for nausea, vomiting, abdominal pain, diarrhea, constipation, blood in stool, abdominal distention and anal bleeding.  Genitourinary: Negative for hematuria, vaginal bleeding and difficulty urinating.  Musculoskeletal: Positive for arthralgias.  Skin: Negative for rash and wound.  Neurological: Negative for seizures, syncope and headaches.  Hematological: Negative for adenopathy. Bruises/bleeds easily.  Psychiatric/Behavioral: Negative for confusion.    Blood pressure 134/62, pulse 77, temperature 97.7 F (36.5 C), temperature source Temporal, resp. rate 18, height 5\' 4"  (1.626 m), weight 157 lb 6.4 oz (71.396 kg).  Physical Exam Physical Exam  Vitals reviewed. Constitutional: She appears well-developed and well-nourished.  Pulmonary/Chest: Right breast exhibits no inverted nipple, no mass, no nipple discharge, no skin change and no tenderness. Left breast exhibits no inverted nipple, no mass, no nipple discharge, no skin change and no  tenderness. Breasts are symmetrical.  Lymphadenopathy:    She has no cervical adenopathy.    She has no axillary adenopathy.       Right: No supraclavicular adenopathy present.       Left: No supraclavicular adenopathy present.    Data Reviewed DIGITAL DIAGNOSTIC LEFT MAMMOGRAM AND LEFT BREAST ULTRASOUND:  Comparison: With priors  Findings: Spot compression views of the inferior aspect of the  left breast were obtained. There are two nodules in the 6  and 7  o'clock region of the breast measuring 9 and 5 mm. There are no  associated microcalcifications.  On physical exam, I do not palpate a mass in the left breast.  Ultrasound is performed, showing there is a near anechoic lesion in  the 06-07 o'clock region of the left breast 6 cm from the nipple  measuring 5 x 3 x 4 mm. Medial to this lesion at 7 o'clock 6 cm  from the nipple is a smaller near anechoic lesion measuring 3 x 2 x  3 mm.  IMPRESSION:  Probable left breast cysts.  RECOMMENDATION:  Short-term interval follow-up left breast ultrasound in 6 months is  recommended.  I have discussed the findings and recommendations with the patient.  Results were also provided in writing at the conclusion of the  visit.  BI-RADS CATEGORY 3: Probably benign finding(s) - short interval  follow-up suggested.   Assessment    Likely left breast cysts Mastalgia    Plan    Her clinical exam today is normal and I don't really identify an  abnormality. On her ultrasound shows what appear to be 2 very small probable left breast cysts. I think it is reasonable to follow radiology recommendations for a repeat ultrasound.We also discussed a variety of reasons to pursue  a biopsy. I don't think she merits a biopsy right now and do not think that she has breast cancer. I think it is reasonable to repeat ultrasound. We are going to repeat that at 4 months after we discussed last the breast center to do this. I will plan on seeing her back after  that. I asked her if she has any changes are noticed is anything else between now and then to call me and I will see her sooner.  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 I gave her a number of conservative therapies including the wearing a good fit a. We also discussed possible using the remaining primrose oil as well as other conservative therapies which have included in her after visit summary also. If this does not get better or continues to worsen she is to call me sooner as well.       Karin Griffith 04/26/2012, 4:06 PM

## 2012-04-26 NOTE — Telephone Encounter (Signed)
The pt is requesting refills.  Last seen on 04/12/12 and has a fu on 05/25/12.  Xanax was last filled on 11/17/11 #60 with 1 additional refill.  Please advise.  Thanks!!

## 2012-04-26 NOTE — Telephone Encounter (Signed)
Xanax called and left on voicemail.  Imitrex sent by e-scribe.

## 2012-04-26 NOTE — Telephone Encounter (Signed)
Ok to refill x 1  

## 2012-04-27 NOTE — Telephone Encounter (Signed)
I specifically used his name  .

## 2012-04-28 NOTE — Telephone Encounter (Signed)
This patient is coming in on 05/18/12 for labwork.  She would like to know if a glutton sensitivity and a CRP can be ordered.  Please advise.  Thanks!!

## 2012-04-28 NOTE — Telephone Encounter (Signed)
Ok to add celiac panel and crp  dx  Iron deficincy and  RA

## 2012-04-28 NOTE — Telephone Encounter (Signed)
She was notified that you wanted her to see Dr. Renaldo Fiddler.  Please see below.  Thanks!!

## 2012-04-29 ENCOUNTER — Ambulatory Visit (INDEPENDENT_AMBULATORY_CARE_PROVIDER_SITE_OTHER): Payer: BC Managed Care – PPO | Admitting: General Practice

## 2012-04-29 DIAGNOSIS — R894 Abnormal immunological findings in specimens from other organs, systems and tissues: Secondary | ICD-10-CM

## 2012-04-29 DIAGNOSIS — I2699 Other pulmonary embolism without acute cor pulmonale: Secondary | ICD-10-CM

## 2012-04-29 DIAGNOSIS — R760 Raised antibody titer: Secondary | ICD-10-CM

## 2012-04-29 DIAGNOSIS — Z7901 Long term (current) use of anticoagulants: Secondary | ICD-10-CM

## 2012-04-30 ENCOUNTER — Other Ambulatory Visit: Payer: Self-pay | Admitting: Family Medicine

## 2012-04-30 DIAGNOSIS — E611 Iron deficiency: Secondary | ICD-10-CM

## 2012-04-30 DIAGNOSIS — M069 Rheumatoid arthritis, unspecified: Secondary | ICD-10-CM

## 2012-04-30 NOTE — Telephone Encounter (Signed)
Future orders placed in the system.

## 2012-05-17 ENCOUNTER — Other Ambulatory Visit: Payer: BC Managed Care – PPO

## 2012-05-18 ENCOUNTER — Other Ambulatory Visit: Payer: BC Managed Care – PPO

## 2012-05-21 ENCOUNTER — Other Ambulatory Visit (INDEPENDENT_AMBULATORY_CARE_PROVIDER_SITE_OTHER): Payer: BC Managed Care – PPO

## 2012-05-21 DIAGNOSIS — D509 Iron deficiency anemia, unspecified: Secondary | ICD-10-CM

## 2012-05-21 DIAGNOSIS — E611 Iron deficiency: Secondary | ICD-10-CM

## 2012-05-21 DIAGNOSIS — M069 Rheumatoid arthritis, unspecified: Secondary | ICD-10-CM

## 2012-05-21 DIAGNOSIS — Z0000 Encounter for general adult medical examination without abnormal findings: Secondary | ICD-10-CM

## 2012-05-21 LAB — CBC WITH DIFFERENTIAL/PLATELET
Basophils Relative: 0.6 % (ref 0.0–3.0)
Eosinophils Relative: 1.7 % (ref 0.0–5.0)
HCT: 41.6 % (ref 36.0–46.0)
MCV: 86.3 fl (ref 78.0–100.0)
Monocytes Relative: 8.5 % (ref 3.0–12.0)
Neutrophils Relative %: 55.6 % (ref 43.0–77.0)
Platelets: 235 10*3/uL (ref 150.0–400.0)
RBC: 4.82 Mil/uL (ref 3.87–5.11)
WBC: 7.3 10*3/uL (ref 4.5–10.5)

## 2012-05-21 LAB — POCT URINALYSIS DIPSTICK
Bilirubin, UA: NEGATIVE
Ketones, UA: NEGATIVE
Spec Grav, UA: >=1.03
pH, UA: 5.5

## 2012-05-21 LAB — LIPID PANEL
HDL: 81 mg/dL (ref >39.00)
LDL Cholesterol: 86 mg/dL (ref 0–99)
Total CHOL/HDL Ratio: 2
VLDL: 25.6 mg/dL (ref 0.0–40.0)

## 2012-05-21 LAB — HEPATIC FUNCTION PANEL
ALT: 26 U/L (ref 0–35)
AST: 23 U/L (ref 0–37)
Bilirubin, Direct: 0 mg/dL (ref 0.0–0.3)
Total Bilirubin: 0.8 mg/dL (ref 0.3–1.2)
Total Protein: 7.4 g/dL (ref 6.0–8.3)

## 2012-05-21 LAB — VITAMIN B12: Vitamin B-12: 509 pg/mL (ref 211–911)

## 2012-05-21 LAB — BASIC METABOLIC PANEL
BUN: 12 mg/dL (ref 6–23)
Chloride: 106 mEq/L (ref 96–112)
GFR: 84.33 mL/min (ref >60.00)
Potassium: 4.6 mEq/L (ref 3.5–5.1)

## 2012-05-21 LAB — MAGNESIUM: Magnesium: 1.8 mg/dL (ref 1.5–2.5)

## 2012-05-21 LAB — IBC PANEL
Saturation Ratios: 27 % (ref 20.0–50.0)
Transferrin: 227.8 mg/dL (ref 212.0–360.0)

## 2012-05-24 LAB — GLIA (IGA/G) + TTG IGA
Gliadin IgG: 13.5 U/mL (ref <20)
Tissue Transglutaminase Ab, IgA: 4.8 U/mL (ref <20)

## 2012-05-25 ENCOUNTER — Encounter: Payer: Self-pay | Admitting: Internal Medicine

## 2012-05-25 ENCOUNTER — Ambulatory Visit (INDEPENDENT_AMBULATORY_CARE_PROVIDER_SITE_OTHER): Payer: BC Managed Care – PPO | Admitting: Internal Medicine

## 2012-05-25 VITALS — BP 108/74 | HR 87 | Temp 97.5°F | Ht 63.0 in | Wt 157.0 lb

## 2012-05-25 DIAGNOSIS — F4322 Adjustment disorder with anxiety: Secondary | ICD-10-CM

## 2012-05-25 DIAGNOSIS — M810 Age-related osteoporosis without current pathological fracture: Secondary | ICD-10-CM

## 2012-05-25 DIAGNOSIS — M069 Rheumatoid arthritis, unspecified: Secondary | ICD-10-CM

## 2012-05-25 DIAGNOSIS — R8290 Unspecified abnormal findings in urine: Secondary | ICD-10-CM | POA: Insufficient documentation

## 2012-05-25 DIAGNOSIS — Z87440 Personal history of urinary (tract) infections: Secondary | ICD-10-CM

## 2012-05-25 DIAGNOSIS — D509 Iron deficiency anemia, unspecified: Secondary | ICD-10-CM

## 2012-05-25 DIAGNOSIS — E538 Deficiency of other specified B group vitamins: Secondary | ICD-10-CM

## 2012-05-25 DIAGNOSIS — Z0000 Encounter for general adult medical examination without abnormal findings: Secondary | ICD-10-CM

## 2012-05-25 DIAGNOSIS — R82998 Other abnormal findings in urine: Secondary | ICD-10-CM

## 2012-05-25 NOTE — Progress Notes (Signed)
Chief Complaint  Patient presents with  . Annual Exam    HPI: Patient comes in today for Preventive Health Care visit  She has multiple medical conditions . But actually is doing pretty well.  Usually taking xanax  every night for now and low dose  No recent  uti something irritative and used the non estogen cream  Last pm  Uncertain if could have uti. To see urologist soon.  Not taking prophylaxis because of the MTX ? About osteopoloris  rx . Had bone pain with activella and not yet on bomiva considering forteo.  No fracture  Follow up  /able mammogram finding with surgeon. Has ? about exposure to shingles  No sx of such.  Uses carmol for dry skin asks for refill  ROS:  GEN/ HEENT: No fever, significant weight changes sweats headaches head pain better  vision problems hearing changes, CV/ PULM; No chest pain shortness of breath cough, syncope,edema  change in exercise tolerance. GI /GU: No adominal pain,still has a sore sopt on right ileac crest area  vomiting, change in bowel habits. No blood in the stool. No significant GU symptoms. SKIN/HEME: ,no acute skin rashes suspicious lesions or bleeding. No lymphadenopathy, nodules, masses.  NEURO/ PSYCH:  No neurologic signs such as weakness numbness. No depression take xanax at night fro lseep and anxiety  IMM/ Allergy: No unusual infections.  Allergy .   REST of 12 system review negative except as per HPI Right lat pain   Past Medical History  Diagnosis Date  . Chest pain, unspecified   . Palpitations   . Personal history of venous thrombosis and embolism   . Chronic anticoagulation   . Adjustment disorder with anxiety   . Other and unspecified hyperlipidemia   . B12 deficiency due to diet     is a vegetarian  . Unspecified vitamin D deficiency   . Otalgia, unspecified   . Iron deficiency anemia, unspecified   . Rheumatoid arthritis     deveshewar  . Headache     migraines and head pain  . Hx of varicella     As Child     Family History  Problem Relation Age of Onset  . Dementia Father   . Stroke Father   . Hyperlipidemia Father   . Diabetes Father   . Parkinsonism Mother   . Lymphoma      History   Social History  . Marital Status: Married    Spouse Name: N/A    Number of Children: N/A  . Years of Education: N/A   Social History Main Topics  . Smoking status: Never Smoker   . Smokeless tobacco: None  . Alcohol Use: No  . Drug Use: No  . Sexually Active: None   Other Topics Concern  . None   Social History Narrative   Regular Exercise-no20 yrs in [place] at [employer].Married HH  of 3 G1P1    Outpatient Encounter Prescriptions as of 05/25/2012  Medication Sig Dispense Refill  . Cholecalciferol (VITAMIN D3) 2000 UNITS capsule Take 2,000 Units by mouth daily.        Marland Kitchen COUMADIN 2.5 MG tablet Take 1 tablet (2.5 mg total) by mouth daily. Take as directed by coumadin clinic.  150 each  0  . cycloSPORINE (RESTASIS) 0.05 % ophthalmic emulsion Place 1 drop into both eyes daily.        . folic acid (FOLVITE) 1 MG tablet Take 1 mg by mouth 2 (two) times daily.        Marland Kitchen  hydroxychloroquine (PLAQUENIL) 200 MG tablet Take 200 mg by mouth 2 (two) times daily.        Marland Kitchen loteprednol (LOTEMAX) 0.2 % SUSP 1 drop 4 (four) times daily.      . methotrexate (RHEUMATREX) 2.5 MG tablet Take 20 mg by mouth once a week. Caution:Chemotherapy. Protect from light. 8 tablets weekly.      . mineral/vitamin supplement (MULTIGEN) 70 MG TABS Take 1 tablet (70 mg total) by mouth daily.  30 each  11  . NASCOBAL 500 MCG/0.1ML SOLN USE 50 MCG AS DIRECTED INTO NOSE  2 mL  11  . predniSONE (DELTASONE) 1 MG tablet Take 2 mg by mouth daily. 1.5mg  daily      . SUMAtriptan (IMITREX) 50 MG tablet TAKE ONE TABLET BY MOUTH AS NEEDED. MAY REPEAT IN TWO TO FOUR HOURS IF NEEDED  9 tablet  0  . urea (CARMOL) 40 % CREA Apply topically twice daily  85 each  3  . XANAX 0.25 MG tablet TAKE ONE TABLET BY MOUTH AT BEDTIME AS NEEDED FOR  SLEEP.  60 tablet  0  . ibandronate (BONIVA) 150 MG tablet Take 150 mg by mouth every 30 (thirty) days. Take in the morning with a full glass of water, on an empty stomach, and do not take anything else by mouth or lie down for the next 30 min.      Marland Kitchen trimethoprim (TRIMPEX) 100 MG tablet Take 100 mg by mouth as directed.      . [DISCONTINUED] Bromfenac Sodium (BROMDAY) 0.09 % (DAILY) SOLN Apply 1 drop to eye.          EXAM:  BP 108/74  Pulse 87  Temp 97.5 F (36.4 C) (Oral)  Ht 5\' 3"  (1.6 m)  Wt 157 lb (71.215 kg)  BMI 27.81 kg/m2  SpO2 98%  Body mass index is 27.81 kg/(m^2).  Physical Exam: Vital signs reviewed HYQ:MVHQ is a well-developed well-nourished alert cooperative   female who appears her stated age in no acute distress.  HEENT: normocephalic atraumatic , Eyes: PERRL EOM's full, conjunctiva clear, Nares: paten,t no deformity discharge or tenderness., Ears: no deformity EAC's clear TMs with normal landmarks. Mouth: clear OP, no lesions, edema.  Moist mucous membranes. Dentition in adequate repair. NECK: supple without masses, thyromegaly or bruits. CHEST/PULM:  Clear to auscultation and percussion breath sounds equal no wheeze , rales or rhonchi. No chest wall deformities or tenderness. CV: PMI is nondisplaced, S1 S2 no gallops, murmurs, rubs. Peripheral pulses are full without delay.No JVD .  Breast: normal by inspection . No dimpling, discharge, masses, tenderness or discharge .  ABDOMEN: Bowel sounds normal nontender  No guard or rebound, no hepato splenomegal no CVA tenderness.  No hernia. Extremtities:  No clubbing cyanosis or edema, no acute joint swelling or redness no focal atrophy NEURO:  Oriented x3, cranial nerves 3-12 appear to be intact, no obvious focal weakness,gait within normal limits no abnormal reflexes or asymmetrical SKIN: No acute rashes normal turgor, color, no bruising or petechiae. PSYCH: Oriented, good eye contact, no obvious depression anxiety,  cognition and judgment appear normal. LN: no cervical axillary inguinal adenopathy  Lab Results  Component Value Date   WBC 7.3 05/21/2012   HGB 13.6 05/21/2012   HCT 41.6 05/21/2012   PLT 235.0 05/21/2012   GLUCOSE 91 05/21/2012   CHOL 193 05/21/2012   TRIG 128.0 05/21/2012   HDL 81.00 05/21/2012   LDLCALC 86 05/21/2012   ALT 26 05/21/2012   AST 23  05/21/2012   NA 142 05/21/2012   K 4.6 05/21/2012   CL 106 05/21/2012   CREATININE 0.8 05/21/2012   BUN 12 05/21/2012   CO2 30 05/21/2012   TSH 0.91 05/21/2012   INR 1.4 05/28/2012   Lab Results  Component Value Date   IRON 86 05/21/2012   FERRITIN 92.6 05/21/2012  CRP is normal as well as celiac panel  ASSESSMENT AND PLAN:  Discussed the following assessment and plan:  1. Visit for preventive health examination   2. Abnormal urine   3. VITAMIN B12 DEFICIENCY   4. IRON DEFICIENCY   5. Rheumatoid arthritis   6. Osteoporosis   7. History of recurrent UTI (urinary tract infection)   8. Adjustment disorder with anxiety   disc  meds for osteoporosis  And risk benefit  She is thinking about forteo Repeat ua and culture  To check for infection. Doubt but she is not sure of her sx.  Labs copy to specialists  Caution with xanax  will continue for now  Patient Care Team: [name], MD as PCP - General Susy Frizzle, MD (Rheumatology) Griffith Citron, MD (Gastroenterology) Gaylord Shih, MD (Cardiology) Darletta Moll, MD (Otolaryngology) Melvyn Novas, MD (Neurology) Soledad Gerlach, MD (Obstetrics and Gynecology) Noralee Stain, MD (Dermatology) Kathi Ludwig, MD as Attending Physician (Urology) Patient Instructions  Get urine and culture tomorrow.  As we discussed. .   Get lab appt  On our way out.  Will contact you when results are back.   Consider getting flu vaccine  . Consider getting pneumovax  If you never had this.  For now   Avoid live vaccines unless ok with rheumatology.  Your lab studies are great and I dont see any alarming  findings today.    Preventive Care for Adults, Female A healthy lifestyle and preventive care can promote health and wellness. Preventive health guidelines for women include the following key practices.  A routine yearly physical is a good way to check with your caregiver about your health and preventive screening. It is a chance to share any concerns and updates on your health, and to receive a thorough exam.  Visit your dentist for a routine exam and preventive care every 6 months. Brush your teeth twice a day and floss once a day. Good oral hygiene prevents tooth decay and gum disease.  The frequency of eye exams is based on your age, health, family medical history, use of contact lenses, and other factors. Follow your caregiver's recommendations for frequency of eye exams.  Eat a healthy diet. Foods like vegetables, fruits, whole grains, low-fat dairy products, and lean protein foods contain the nutrients you need without too many calories. Decrease your intake of foods high in solid fats, added sugars, and salt. Eat the right amount of calories for you.Get information about a proper diet from your caregiver, if necessary.  Regular physical exercise is one of the most important things you can do for your health. Most adults should get at least 150 minutes of moderate-intensity exercise (any activity that increases your heart rate and causes you to sweat) each week. In addition, most adults need muscle-strengthening exercises on 2 or more days a week.  Maintain a healthy weight. The body mass index (BMI) is a screening tool to identify possible weight problems. It provides an estimate of body fat based on height and weight. Your caregiver can help determine your BMI, and can help you achieve or maintain  a healthy weight.For adults 20 years and older:  A BMI below 18.5 is considered underweight.  A BMI of 18.5 to 24.9 is normal.  A BMI of 25 to 29.9 is considered overweight.  A BMI of 30  and above is considered obese.  Maintain normal blood lipids and cholesterol levels by exercising and minimizing your intake of saturated fat. Eat a balanced diet with plenty of fruit and vegetables. Blood tests for lipids and cholesterol should begin at age 37 and be repeated every 5 years. If your lipid or cholesterol levels are high, you are over 50, or you are at high risk for heart disease, you may need your cholesterol levels checked more frequently.Ongoing high lipid and cholesterol levels should be treated with medicines if diet and exercise are not effective.  If you smoke, find out from your caregiver how to quit. If you do not use tobacco, do not start.  If you are pregnant, do not drink alcohol. If you are breastfeeding, be very cautious about drinking alcohol. If you are not pregnant and choose to drink alcohol, do not exceed 1 drink per day. One drink is considered to be 12 ounces (355 mL) of beer, 5 ounces (148 mL) of wine, or 1.5 ounces (44 mL) of liquor.  Avoid use of street drugs. Do not share needles with anyone. Ask for help if you need support or instructions about stopping the use of drugs.  High blood pressure causes heart disease and increases the risk of stroke. Your blood pressure should be checked at least every 1 to 2 years. Ongoing high blood pressure should be treated with medicines if weight loss and exercise are not effective.  If you are 69 to 54 years old, ask your caregiver if you should take aspirin to prevent strokes.  Diabetes screening involves taking a blood sample to check your fasting blood sugar level. This should be done once every 3 years, after age 72, if you are within normal weight and without risk factors for diabetes. Testing should be considered at a younger age or be carried out more frequently if you are overweight and have at least 1 risk factor for diabetes.  Breast cancer screening is essential preventive care for women. You should practice  "breast self-awareness." This means understanding the normal appearance and feel of your breasts and may include breast self-examination. Any changes detected, no matter how small, should be reported to a caregiver. Women in their 81s and 30s should have a clinical breast exam (CBE) by a caregiver as part of a regular health exam every 1 to 3 years. After age 56, women should have a CBE every year. Starting at age 52, women should consider having a mammography (breast X-ray test) every year. Women who have a family history of breast cancer should talk to their caregiver about genetic screening. Women at a high risk of breast cancer should talk to their caregivers about having magnetic resonance imaging (MRI) and a mammography every year.  The Pap test is a screening test for cervical cancer. A Pap test can show cell changes on the cervix that might become cervical cancer if left untreated. A Pap test is a procedure in which cells are obtained and examined from the lower end of the uterus (cervix).  Women should have a Pap test starting at age 24.  Between ages 69 and 33, Pap tests should be repeated every 2 years.  Beginning at age 65, you should have a Pap test every  3 years as long as the past 3 Pap tests have been normal.  Some women have medical problems that increase the chance of getting cervical cancer. Talk to your caregiver about these problems. It is especially important to talk to your caregiver if a new problem develops soon after your last Pap test. In these cases, your caregiver may recommend more frequent screening and Pap tests.  The above recommendations are the same for women who have or have not gotten the vaccine for human papillomavirus (HPV).  If you had a hysterectomy for a problem that was not cancer or a condition that could lead to cancer, then you no longer need Pap tests. Even if you no longer need a Pap test, a regular exam is a good idea to make sure no other problems are  starting.  If you are between ages 49 and 58, and you have had normal Pap tests going back 10 years, you no longer need Pap tests. Even if you no longer need a Pap test, a regular exam is a good idea to make sure no other problems are starting.  If you have had past treatment for cervical cancer or a condition that could lead to cancer, you need Pap tests and screening for cancer for at least 20 years after your treatment.  If Pap tests have been discontinued, risk factors (such as a new sexual partner) need to be reassessed to determine if screening should be resumed.  The HPV test is an additional test that may be used for cervical cancer screening. The HPV test looks for the virus that can cause the cell changes on the cervix. The cells collected during the Pap test can be tested for HPV. The HPV test could be used to screen women aged 55 years and older, and should be used in women of any age who have unclear Pap test results. After the age of 44, women should have HPV testing at the same frequency as a Pap test.  Colorectal cancer can be detected and often prevented. Most routine colorectal cancer screening begins at the age of 76 and continues through age 26. However, your caregiver may recommend screening at an earlier age if you have risk factors for colon cancer. On a yearly basis, your caregiver may provide home test kits to check for hidden blood in the stool. Use of a small camera at the end of a tube, to directly examine the colon (sigmoidoscopy or colonoscopy), can detect the earliest forms of colorectal cancer. Talk to your caregiver about this at age 49, when routine screening begins. Direct examination of the colon should be repeated every 5 to 10 years through age 50, unless early forms of pre-cancerous polyps or small growths are found.  Hepatitis C blood testing is recommended for all people born from [birth year] through 1965 and any individual with known risks for hepatitis C.  Practice  safe sex. Use condoms and avoid high-risk sexual practices to reduce the spread of sexually transmitted infections (STIs). STIs include gonorrhea, chlamydia, syphilis, trichomonas, herpes, HPV, and human immunodeficiency virus (HIV). Herpes, HIV, and HPV are viral illnesses that have no cure. They can result in disability, cancer, and death. Sexually active women aged 53 and younger should be checked for chlamydia. Older women with new or multiple partners should also be tested for chlamydia. Testing for other STIs is recommended if you are sexually active and at increased risk.  Osteoporosis is a disease in which the bones lose minerals and  strength with aging. This can result in serious bone fractures. The risk of osteoporosis can be identified using a bone density scan. Women ages 12 and over and women at risk for fractures or osteoporosis should discuss screening with their caregivers. Ask your caregiver whether you should take a calcium supplement or vitamin D to reduce the rate of osteoporosis.  Menopause can be associated with physical symptoms and risks. Hormone replacement therapy is available to decrease symptoms and risks. You should talk to your caregiver about whether hormone replacement therapy is right for you.  Use sunscreen with sun protection factor (SPF) of 30 or more. Apply sunscreen liberally and repeatedly throughout the day. You should seek shade when your shadow is shorter than you. Protect yourself by wearing long sleeves, pants, a wide-brimmed hat, and sunglasses year round, whenever you are outdoors.  Once a month, do a whole body skin exam, using a mirror to look at the skin on your back. Notify your caregiver of new moles, moles that have irregular borders, moles that are larger than a pencil eraser, or moles that have changed in shape or color.  Stay current with required immunizations.  Influenza. You need a dose every fall (or winter). The composition of the flu vaccine  changes each year, so being vaccinated once is not enough.  Pneumococcal polysaccharide. You need 1 to 2 doses if you smoke cigarettes or if you have certain chronic medical conditions. You need 1 dose at age 69 (or older) if you have never been vaccinated.  Tetanus, diphtheria, pertussis (Tdap, Td). Get 1 dose of Tdap vaccine if you are younger than age 66, are over 61 and have contact with an infant, are a Research scientist (physical sciences), are pregnant, or simply want to be protected from whooping cough. After that, you need a Td booster dose every 10 years. Consult your caregiver if you have not had at least 3 tetanus and diphtheria-containing shots sometime in your life or have a deep or dirty wound.  HPV. You need this vaccine if you are a woman age 11 or younger. The vaccine is given in 3 doses over 6 months.  Measles, mumps, rubella (MMR). You need at least 1 dose of MMR if you were born in 1957 or later. You may also need a second dose.  Meningococcal. If you are age 6 to 53 and a first-year college student living in a residence hall, or have one of several medical conditions, you need to get vaccinated against meningococcal disease. You may also need additional booster doses.  Zoster (shingles). If you are age 79 or older, you should get this vaccine.  Varicella (chickenpox). If you have never had chickenpox or you were vaccinated but received only 1 dose, talk to your caregiver to find out if you need this vaccine.  Hepatitis A. You need this vaccine if you have a specific risk factor for hepatitis A virus infection or you simply wish to be protected from this disease. The vaccine is usually given as 2 doses, 6 to 18 months apart.  Hepatitis B. You need this vaccine if you have a specific risk factor for hepatitis B virus infection or you simply wish to be protected from this disease. The vaccine is given in 3 doses, usually over 6 months. Preventive Services / Frequency Ages 70 to 67  Blood  pressure check.** / Every 1 to 2 years.  Lipid and cholesterol check.** / Every 5 years beginning at age 40.  Clinical breast exam.** /  Every year after age 28.  Mammogram.** / Every year beginning at age 65 and continuing for as long as you are in good health. Consult with your caregiver.  Pap test.** / Every 3 years starting at age 58 through age 92 or 75 with a history of 3 consecutive normal Pap tests.  HPV screening.** / Every 3 years from ages 29 through ages 80 to 29 with a history of 3 consecutive normal Pap tests.  Fecal occult blood test (FOBT) of stool. / Every year beginning at age 35 and continuing until age 51. You may not need to do this test if you get a colonoscopy every 10 years.  Flexible sigmoidoscopy or colonoscopy.** / Every 5 years for a flexible sigmoidoscopy or every 10 years for a colonoscopy beginning at age 19 and continuing until age 83.  Hepatitis C blood test.** / For all people born from [birth year] through 1967 and any individual with known risks for hepatitis C.  Skin self-exam. / Monthly.  Influenza immunization.** / Every year.  Pneumococcal polysaccharide immunization.** / 1 to 2 doses if you smoke cigarettes or if you have certain chronic medical conditions.  Tetanus, diphtheria, pertussis (Tdap, Td) immunization.** / A one-time dose of Tdap vaccine. After that, you need a Td booster dose every 10 years.  Measles, mumps, rubella (MMR) immunization. / You need at least 1 dose of MMR if you were born in 1957 or later. You may also need a second dose.  Varicella immunization.** / Consult your caregiver.  Meningococcal immunization.** / Consult your caregiver.  Hepatitis A immunization.** / Consult your caregiver. 2 doses, 6 to 18 months apart.  Hepatitis B immunization.** / Consult your caregiver. 3 doses, usually over 6 months. Ages 46 and over  Blood pressure check.** / Every 1 to 2 years.  Lipid and cholesterol check.** / Every 5 years beginning  at age 37.  Clinical breast exam.** / Every year after age 57.  Mammogram.** / Every year beginning at age 102 and continuing for as long as you are in good health. Consult with your caregiver.  Pap test.** / Every 3 years starting at age 52 through age 33 or 41 with a 3 consecutive normal Pap tests. Testing can be stopped between 65 and 70 with 3 consecutive normal Pap tests and no abnormal Pap or HPV tests in the past 10 years.  HPV screening.** / Every 3 years from ages 65 through ages 78 or 67 with a history of 3 consecutive normal Pap tests. Testing can be stopped between 65 and 70 with 3 consecutive normal Pap tests and no abnormal Pap or HPV tests in the past 10 years.  Fecal occult blood test (FOBT) of stool. / Every year beginning at age 52 and continuing until age 64. You may not need to do this test if you get a colonoscopy every 10 years.  Flexible sigmoidoscopy or colonoscopy.** / Every 5 years for a flexible sigmoidoscopy or every 10 years for a colonoscopy beginning at age 47 and continuing until age 50.  Hepatitis C blood test.** / For all people born from [birth year] through 1967 and any individual with known risks for hepatitis C.  Osteoporosis screening.** / A one-time screening for women ages 25 and over and women at risk for fractures or osteoporosis.  Skin self-exam. / Monthly.  Influenza immunization.** / Every year.  Pneumococcal polysaccharide immunization.** / 1 dose at age 82 (or older) if you have never been vaccinated.  Tetanus, diphtheria, pertussis (Tdap,  Td) immunization. / A one-time dose of Tdap vaccine if you are over 65 and have contact with an infant, are a Research scientist (physical sciences), or simply want to be protected from whooping cough. After that, you need a Td booster dose every 10 years.  Varicella immunization.** / Consult your caregiver.  Meningococcal immunization.** / Consult your caregiver.  Hepatitis A immunization.** / Consult your caregiver. 2 doses, 6 to  18 months apart.  Hepatitis B immunization.** / Check with your caregiver. 3 doses, usually over 6 months. ** Family history and personal history of risk and conditions may change your caregiver's recommendations. Document Released: 07/01/2001 Document Revised: 07/28/2011 Document Reviewed: 09/30/2010 [facility] Patient Information 2013 [place], [place].       Neta Mends. Tristan Bramble M.D.

## 2012-05-25 NOTE — Patient Instructions (Addendum)
Get urine and culture tomorrow.  As we discussed. .   Get lab appt  On our way out.  Will contact you when results are back.   Consider getting flu vaccine  . Consider getting pneumovax  If you never had this.  For now   Avoid live vaccines unless ok with rheumatology.  Your lab studies are great and I dont see any alarming findings today.    Preventive Care for Adults, Female A healthy lifestyle and preventive care can promote health and wellness. Preventive health guidelines for women include the following key practices.  A routine yearly physical is a good way to check with your caregiver about your health and preventive screening. It is a chance to share any concerns and updates on your health, and to receive a thorough exam.  Visit your dentist for a routine exam and preventive care every 6 months. Brush your teeth twice a day and floss once a day. Good oral hygiene prevents tooth decay and gum disease.  The frequency of eye exams is based on your age, health, family medical history, use of contact lenses, and other factors. Follow your caregiver's recommendations for frequency of eye exams.  Eat a healthy diet. Foods like vegetables, fruits, whole grains, low-fat dairy products, and lean protein foods contain the nutrients you need without too many calories. Decrease your intake of foods high in solid fats, added sugars, and salt. Eat the right amount of calories for you.Get information about a proper diet from your caregiver, if necessary.  Regular physical exercise is one of the most important things you can do for your health. Most adults should get at least 150 minutes of moderate-intensity exercise (any activity that increases your heart rate and causes you to sweat) each week. In addition, most adults need muscle-strengthening exercises on 2 or more days a week.  Maintain a healthy weight. The body mass index (BMI) is a screening tool to identify possible weight problems. It  provides an estimate of body fat based on height and weight. Your caregiver can help determine your BMI, and can help you achieve or maintain a healthy weight.For adults 20 years and older:  A BMI below 18.5 is considered underweight.  A BMI of 18.5 to 24.9 is normal.  A BMI of 25 to 29.9 is considered overweight.  A BMI of 30 and above is considered obese.  Maintain normal blood lipids and cholesterol levels by exercising and minimizing your intake of saturated fat. Eat a balanced diet with plenty of fruit and vegetables. Blood tests for lipids and cholesterol should begin at age 18 and be repeated every 5 years. If your lipid or cholesterol levels are high, you are over 50, or you are at high risk for heart disease, you may need your cholesterol levels checked more frequently.Ongoing high lipid and cholesterol levels should be treated with medicines if diet and exercise are not effective.  If you smoke, find out from your caregiver how to quit. If you do not use tobacco, do not start.  If you are pregnant, do not drink alcohol. If you are breastfeeding, be very cautious about drinking alcohol. If you are not pregnant and choose to drink alcohol, do not exceed 1 drink per day. One drink is considered to be 12 ounces (355 mL) of beer, 5 ounces (148 mL) of wine, or 1.5 ounces (44 mL) of liquor.  Avoid use of street drugs. Do not share needles with anyone. Ask for help if you need  support or instructions about stopping the use of drugs.  High blood pressure causes heart disease and increases the risk of stroke. Your blood pressure should be checked at least every 1 to 2 years. Ongoing high blood pressure should be treated with medicines if weight loss and exercise are not effective.  If you are 67 to 53 years old, ask your caregiver if you should take aspirin to prevent strokes.  Diabetes screening involves taking a blood sample to check your fasting blood sugar level. This should be done once  every 3 years, after age 24, if you are within normal weight and without risk factors for diabetes. Testing should be considered at a younger age or be carried out more frequently if you are overweight and have at least 1 risk factor for diabetes.  Breast cancer screening is essential preventive care for women. You should practice "breast self-awareness." This means understanding the normal appearance and feel of your breasts and may include breast self-examination. Any changes detected, no matter how small, should be reported to a caregiver. Women in their 10s and 30s should have a clinical breast exam (CBE) by a caregiver as part of a regular health exam every 1 to 3 years. After age 33, women should have a CBE every year. Starting at age 74, women should consider having a mammography (breast X-ray test) every year. Women who have a family history of breast cancer should talk to their caregiver about genetic screening. Women at a high risk of breast cancer should talk to their caregivers about having magnetic resonance imaging (MRI) and a mammography every year.  The Pap test is a screening test for cervical cancer. A Pap test can show cell changes on the cervix that might become cervical cancer if left untreated. A Pap test is a procedure in which cells are obtained and examined from the lower end of the uterus (cervix).  Women should have a Pap test starting at age 94.  Between ages 32 and 47, Pap tests should be repeated every 2 years.  Beginning at age 24, you should have a Pap test every 3 years as long as the past 3 Pap tests have been normal.  Some women have medical problems that increase the chance of getting cervical cancer. Talk to your caregiver about these problems. It is especially important to talk to your caregiver if a new problem develops soon after your last Pap test. In these cases, your caregiver may recommend more frequent screening and Pap tests.  The above recommendations are  the same for women who have or have not gotten the vaccine for human papillomavirus (HPV).  If you had a hysterectomy for a problem that was not cancer or a condition that could lead to cancer, then you no longer need Pap tests. Even if you no longer need a Pap test, a regular exam is a good idea to make sure no other problems are starting.  If you are between ages 66 and 41, and you have had normal Pap tests going back 10 years, you no longer need Pap tests. Even if you no longer need a Pap test, a regular exam is a good idea to make sure no other problems are starting.  If you have had past treatment for cervical cancer or a condition that could lead to cancer, you need Pap tests and screening for cancer for at least 20 years after your treatment.  If Pap tests have been discontinued, risk factors (such as a  new sexual partner) need to be reassessed to determine if screening should be resumed.  The HPV test is an additional test that may be used for cervical cancer screening. The HPV test looks for the virus that can cause the cell changes on the cervix. The cells collected during the Pap test can be tested for HPV. The HPV test could be used to screen women aged 72 years and older, and should be used in women of any age who have unclear Pap test results. After the age of 76, women should have HPV testing at the same frequency as a Pap test.  Colorectal cancer can be detected and often prevented. Most routine colorectal cancer screening begins at the age of 76 and continues through age 51. However, your caregiver may recommend screening at an earlier age if you have risk factors for colon cancer. On a yearly basis, your caregiver may provide home test kits to check for hidden blood in the stool. Use of a small camera at the end of a tube, to directly examine the colon (sigmoidoscopy or colonoscopy), can detect the earliest forms of colorectal cancer. Talk to your caregiver about this at age 66, when  routine screening begins. Direct examination of the colon should be repeated every 5 to 10 years through age 33, unless early forms of pre-cancerous polyps or small growths are found.  Hepatitis C blood testing is recommended for all people born from [birth year] through 1965 and any individual with known risks for hepatitis C.  Practice safe sex. Use condoms and avoid high-risk sexual practices to reduce the spread of sexually transmitted infections (STIs). STIs include gonorrhea, chlamydia, syphilis, trichomonas, herpes, HPV, and human immunodeficiency virus (HIV). Herpes, HIV, and HPV are viral illnesses that have no cure. They can result in disability, cancer, and death. Sexually active women aged 59 and younger should be checked for chlamydia. Older women with new or multiple partners should also be tested for chlamydia. Testing for other STIs is recommended if you are sexually active and at increased risk.  Osteoporosis is a disease in which the bones lose minerals and strength with aging. This can result in serious bone fractures. The risk of osteoporosis can be identified using a bone density scan. Women ages 79 and over and women at risk for fractures or osteoporosis should discuss screening with their caregivers. Ask your caregiver whether you should take a calcium supplement or vitamin D to reduce the rate of osteoporosis.  Menopause can be associated with physical symptoms and risks. Hormone replacement therapy is available to decrease symptoms and risks. You should talk to your caregiver about whether hormone replacement therapy is right for you.  Use sunscreen with sun protection factor (SPF) of 30 or more. Apply sunscreen liberally and repeatedly throughout the day. You should seek shade when your shadow is shorter than you. Protect yourself by wearing long sleeves, pants, a wide-brimmed hat, and sunglasses year round, whenever you are outdoors.  Once a month, do a whole body skin exam, using a  mirror to look at the skin on your back. Notify your caregiver of new moles, moles that have irregular borders, moles that are larger than a pencil eraser, or moles that have changed in shape or color.  Stay current with required immunizations.  Influenza. You need a dose every fall (or winter). The composition of the flu vaccine changes each year, so being vaccinated once is not enough.  Pneumococcal polysaccharide. You need 1 to 2 doses if you  smoke cigarettes or if you have certain chronic medical conditions. You need 1 dose at age 38 (or older) if you have never been vaccinated.  Tetanus, diphtheria, pertussis (Tdap, Td). Get 1 dose of Tdap vaccine if you are younger than age 71, are over 23 and have contact with an infant, are a Research scientist (physical sciences), are pregnant, or simply want to be protected from whooping cough. After that, you need a Td booster dose every 10 years. Consult your caregiver if you have not had at least 3 tetanus and diphtheria-containing shots sometime in your life or have a deep or dirty wound.  HPV. You need this vaccine if you are a woman age 73 or younger. The vaccine is given in 3 doses over 6 months.  Measles, mumps, rubella (MMR). You need at least 1 dose of MMR if you were born in 1957 or later. You may also need a second dose.  Meningococcal. If you are age 69 to 28 and a first-year college student living in a residence hall, or have one of several medical conditions, you need to get vaccinated against meningococcal disease. You may also need additional booster doses.  Zoster (shingles). If you are age 17 or older, you should get this vaccine.  Varicella (chickenpox). If you have never had chickenpox or you were vaccinated but received only 1 dose, talk to your caregiver to find out if you need this vaccine.  Hepatitis A. You need this vaccine if you have a specific risk factor for hepatitis A virus infection or you simply wish to be protected from this disease. The  vaccine is usually given as 2 doses, 6 to 18 months apart.  Hepatitis B. You need this vaccine if you have a specific risk factor for hepatitis B virus infection or you simply wish to be protected from this disease. The vaccine is given in 3 doses, usually over 6 months. Preventive Services / Frequency Ages 63 to 110  Blood pressure check.** / Every 1 to 2 years.  Lipid and cholesterol check.** / Every 5 years beginning at age 65.  Clinical breast exam.** / Every year after age 92.  Mammogram.** / Every year beginning at age 87 and continuing for as long as you are in good health. Consult with your caregiver.  Pap test.** / Every 3 years starting at age 48 through age 25 or 29 with a history of 3 consecutive normal Pap tests.  HPV screening.** / Every 3 years from ages 31 through ages 47 to 81 with a history of 3 consecutive normal Pap tests.  Fecal occult blood test (FOBT) of stool. / Every year beginning at age 63 and continuing until age 66. You may not need to do this test if you get a colonoscopy every 10 years.  Flexible sigmoidoscopy or colonoscopy.** / Every 5 years for a flexible sigmoidoscopy or every 10 years for a colonoscopy beginning at age 61 and continuing until age 5.  Hepatitis C blood test.** / For all people born from [birth year] through 1965 and any individual with known risks for hepatitis C.  Skin self-exam. / Monthly.  Influenza immunization.** / Every year.  Pneumococcal polysaccharide immunization.** / 1 to 2 doses if you smoke cigarettes or if you have certain chronic medical conditions.  Tetanus, diphtheria, pertussis (Tdap, Td) immunization.** / A one-time dose of Tdap vaccine. After that, you need a Td booster dose every 10 years.  Measles, mumps, rubella (MMR) immunization. / You need at least 1 dose of  MMR if you were born in 1957 or later. You may also need a second dose.  Varicella immunization.** / Consult your caregiver.  Meningococcal immunization.**  / Consult your caregiver.  Hepatitis A immunization.** / Consult your caregiver. 2 doses, 6 to 18 months apart.  Hepatitis B immunization.** / Consult your caregiver. 3 doses, usually over 6 months. Ages 79 and over  Blood pressure check.** / Every 1 to 2 years.  Lipid and cholesterol check.** / Every 5 years beginning at age 75.  Clinical breast exam.** / Every year after age 66.  Mammogram.** / Every year beginning at age 54 and continuing for as long as you are in good health. Consult with your caregiver.  Pap test.** / Every 3 years starting at age 21 through age 69 or 51 with a 3 consecutive normal Pap tests. Testing can be stopped between 65 and 70 with 3 consecutive normal Pap tests and no abnormal Pap or HPV tests in the past 10 years.  HPV screening.** / Every 3 years from ages 12 through ages 68 or 11 with a history of 3 consecutive normal Pap tests. Testing can be stopped between 65 and 70 with 3 consecutive normal Pap tests and no abnormal Pap or HPV tests in the past 10 years.  Fecal occult blood test (FOBT) of stool. / Every year beginning at age 51 and continuing until age 11. You may not need to do this test if you get a colonoscopy every 10 years.  Flexible sigmoidoscopy or colonoscopy.** / Every 5 years for a flexible sigmoidoscopy or every 10 years for a colonoscopy beginning at age 32 and continuing until age 28.  Hepatitis C blood test.** / For all people born from [birth year] through 1967 and any individual with known risks for hepatitis C.  Osteoporosis screening.** / A one-time screening for women ages 44 and over and women at risk for fractures or osteoporosis.  Skin self-exam. / Monthly.  Influenza immunization.** / Every year.  Pneumococcal polysaccharide immunization.** / 1 dose at age 101 (or older) if you have never been vaccinated.  Tetanus, diphtheria, pertussis (Tdap, Td) immunization. / A one-time dose of Tdap vaccine if you are over 65 and have contact with  an infant, are a Research scientist (physical sciences), or simply want to be protected from whooping cough. After that, you need a Td booster dose every 10 years.  Varicella immunization.** / Consult your caregiver.  Meningococcal immunization.** / Consult your caregiver.  Hepatitis A immunization.** / Consult your caregiver. 2 doses, 6 to 18 months apart.  Hepatitis B immunization.** / Check with your caregiver. 3 doses, usually over 6 months. ** Family history and personal history of risk and conditions may change your caregiver's recommendations. Document Released: 07/01/2001 Document Revised: 07/28/2011 Document Reviewed: 09/30/2010 [facility] Patient Information 2013 [place], [place].

## 2012-05-26 ENCOUNTER — Other Ambulatory Visit (INDEPENDENT_AMBULATORY_CARE_PROVIDER_SITE_OTHER): Payer: BC Managed Care – PPO

## 2012-05-26 DIAGNOSIS — N390 Urinary tract infection, site not specified: Secondary | ICD-10-CM

## 2012-05-26 LAB — POCT URINALYSIS DIPSTICK
Ketones, UA: NEGATIVE
Nitrite, UA: NEGATIVE
Protein, UA: NEGATIVE
Urobilinogen, UA: 0.2
pH, UA: 6

## 2012-05-28 ENCOUNTER — Telehealth: Payer: Self-pay | Admitting: Internal Medicine

## 2012-05-28 ENCOUNTER — Ambulatory Visit (INDEPENDENT_AMBULATORY_CARE_PROVIDER_SITE_OTHER): Payer: BC Managed Care – PPO | Admitting: General Practice

## 2012-05-28 DIAGNOSIS — Z7901 Long term (current) use of anticoagulants: Secondary | ICD-10-CM

## 2012-05-28 DIAGNOSIS — R760 Raised antibody titer: Secondary | ICD-10-CM

## 2012-05-28 DIAGNOSIS — R894 Abnormal immunological findings in specimens from other organs, systems and tissues: Secondary | ICD-10-CM

## 2012-05-28 DIAGNOSIS — I2699 Other pulmonary embolism without acute cor pulmonale: Secondary | ICD-10-CM

## 2012-05-28 LAB — POCT INR: INR: 1.4

## 2012-05-28 NOTE — Telephone Encounter (Signed)
Informed the pt that Dr. Fabian Sharp knows that she is not on an antibiotic.  She did not understand the wording in MyChart.

## 2012-05-28 NOTE — Telephone Encounter (Signed)
Pt would like for you to call her concerning urine test. She want Dr Fabian Sharp to know she is NOT taking any antibiotic at this time. She wants Dr Fabian Sharp to know before she makes any decision. According to the  Message pt got in [facility], it looks like Dr Fabian Sharp thinks she is on an antibiotic. Pls call pt about this.

## 2012-06-16 ENCOUNTER — Other Ambulatory Visit (HOSPITAL_COMMUNITY)
Admission: RE | Admit: 2012-06-16 | Discharge: 2012-06-16 | Disposition: A | Discharge status: Home / Self Care | Payer: BC Managed Care – PPO | Source: Ambulatory Visit | Attending: Obstetrics and Gynecology | Admitting: Obstetrics and Gynecology

## 2012-06-16 ENCOUNTER — Other Ambulatory Visit: Payer: Self-pay | Admitting: Internal Medicine

## 2012-06-16 ENCOUNTER — Other Ambulatory Visit: Payer: Self-pay | Admitting: Obstetrics and Gynecology

## 2012-06-16 DIAGNOSIS — Z1151 Encounter for screening for human papillomavirus (HPV): Secondary | ICD-10-CM | POA: Insufficient documentation

## 2012-06-16 DIAGNOSIS — Z01419 Encounter for gynecological examination (general) (routine) without abnormal findings: Secondary | ICD-10-CM | POA: Insufficient documentation

## 2012-06-19 ENCOUNTER — Encounter: Payer: Self-pay | Admitting: Family Medicine

## 2012-06-19 ENCOUNTER — Ambulatory Visit (INDEPENDENT_AMBULATORY_CARE_PROVIDER_SITE_OTHER): Payer: BC Managed Care – PPO | Admitting: Family Medicine

## 2012-06-19 VITALS — BP 102/70 | HR 77 | Temp 97.6°F | Wt 158.0 lb

## 2012-06-19 DIAGNOSIS — R300 Dysuria: Secondary | ICD-10-CM

## 2012-06-19 LAB — POCT URINALYSIS DIPSTICK
Bilirubin, UA: NEGATIVE
Ketones, UA: NEGATIVE
Spec Grav, UA: >=1.03
pH, UA: 6

## 2012-06-19 NOTE — Addendum Note (Signed)
Addended by: Ellsworth Lennox on: 06/19/2012 11:35 AM   Modules accepted: Orders

## 2012-06-19 NOTE — Progress Notes (Signed)
Chief Complaint  Patient presents with  . Dysuria    x 2 days. Has been seen by PCP and urologist recently     HPI:  Urgent Care Visit for ? UTI: -started: about 2 days ago - but has chronic issues, followed by PCP and her urologist Dr. Patsi Sears (saw them 1 week ago), culture done then and negative, reports she will follow up with urology in 2 days - but wanted to check urine -symptoms: pain after urinating, worried she has a UTI -denies: fevers, flank pain, vomiting -hx of: UTI 6 months ago - tx with cipro and trimethoprim -has tried: fluids  ROS: See pertinent positives and negatives per HPI.  Past Medical History  Diagnosis Date  . Chest pain, unspecified   . Palpitations   . Personal history of venous thrombosis and embolism   . Chronic anticoagulation   . Adjustment disorder with anxiety   . Other and unspecified hyperlipidemia   . B12 deficiency due to diet     is a vegetarian  . Unspecified vitamin D deficiency   . Otalgia, unspecified   . Iron deficiency anemia, unspecified   . Rheumatoid arthritis     deveshewar  . Headache     migraines and head pain  . Hx of varicella     As Child    Family History  Problem Relation Age of Onset  . Dementia Father   . Stroke Father   . Hyperlipidemia Father   . Diabetes Father   . Parkinsonism Mother   . Lymphoma      History   Social History  . Marital Status: Married    Spouse Name: N/A    Number of Children: N/A  . Years of Education: N/A   Social History Main Topics  . Smoking status: Never Smoker   . Smokeless tobacco: None  . Alcohol Use: No  . Drug Use: No  . Sexually Active: None   Other Topics Concern  . None   Social History Narrative   Regular Exercise-no20 yrs in [place] at [employer].Married HH  of 3 G1P1    Current outpatient prescriptions:Cholecalciferol (VITAMIN D3) 2000 UNITS capsule, Take 2,000 Units by mouth daily.  , Disp: , Rfl: ;  COUMADIN 2.5 MG tablet, Take 1 tablet (2.5  mg total) by mouth daily. Take as directed by coumadin clinic., Disp: 150 each, Rfl: 0;  cycloSPORINE (RESTASIS) 0.05 % ophthalmic emulsion, Place 1 drop into both eyes daily.  , Disp: , Rfl:  folic acid (FOLVITE) 1 MG tablet, Take 1 mg by mouth 2 (two) times daily.  , Disp: , Rfl: ;  hydroxychloroquine (PLAQUENIL) 200 MG tablet, Take 200 mg by mouth 2 (two) times daily.  , Disp: , Rfl: ;  ibandronate (BONIVA) 150 MG tablet, Take 150 mg by mouth every 30 (thirty) days. Take in the morning with a full glass of water, on an empty stomach, and do not take anything else by mouth or lie down for the next 30 min., Disp: , Rfl:  loteprednol (LOTEMAX) 0.2 % SUSP, 1 drop 4 (four) times daily., Disp: , Rfl: ;  methotrexate (RHEUMATREX) 2.5 MG tablet, Take 20 mg by mouth once a week. Caution:Chemotherapy. Protect from light. 8 tablets weekly., Disp: , Rfl: ;  mineral/vitamin supplement (MULTIGEN) 70 MG TABS, Take 1 tablet (70 mg total) by mouth daily., Disp: 30 each, Rfl: 11;  NASCOBAL 500 MCG/0.1ML SOLN, USE 50 MCG AS DIRECTED INTO NOSE, Disp: 2 mL, Rfl: 11 predniSONE (  DELTASONE) 1 MG tablet, Take 2 mg by mouth daily. 1.5mg  daily, Disp: , Rfl: ;  SUMAtriptan (IMITREX) 50 MG tablet, TAKE ONE TABLET BY MOUTH AS NEEDED. MAY REPEAT IN TWO TO FOUR HOURS IF NEEDED, Disp: 9 tablet, Rfl: 0;  trimethoprim (TRIMPEX) 100 MG tablet, Take 100 mg by mouth as directed., Disp: , Rfl: ;  urea (CARMOL) 40 % CREA, Apply topically twice daily, Disp: 85 each, Rfl: 3 XANAX 0.25 MG tablet, TAKE ONE TABLET BY MOUTH AT BEDTIME AS NEEDED FOR SLEEP., Disp: 60 tablet, Rfl: 0  EXAM:  Filed Vitals:   06/19/12 1059  BP: 102/70  Pulse: 77  Temp: 97.6 F (36.4 C)    There is no height on file to calculate BMI.  GENERAL: vitals reviewed and listed above, alert, oriented, appears well hydrated and in no acute distress  HEENT: atraumatic, conjunttiva clear, no obvious abnormalities on inspection of external nose and ears  NECK: no obvious  masses on inspection  LUNGS: clear to auscultation bilaterally, no wheezes, rales or rhonchi, good air movement  CV: HRRR, no peripheral edema  ABD: soft, NTTP, no CVA TTP  MS: moves all extremities without noticeable abnormality  PSYCH: pleasant and cooperative, no obvious depression or anxiety  ASSESSMENT AND PLAN:  Discussed the following assessment and plan:  1. Dysuria  POCT Urinalysis Dipstick, Culture, Urine   -chronic intermittent dysuria, followed by her neurologist, hx of UTI, but last several times she has had symptoms culture negative -will check urine dip and culture and tx if looks like infection -otherwise advised she follow up with her urologist -no alarm symptoms -Patient advised to return or notify a doctor immediately if symptoms worsen or persist or new concerns arise.  There are no Patient Instructions on file for this visit.   [name].

## 2012-06-19 NOTE — Patient Instructions (Addendum)
-  follow up with your urologist if symptoms persist  -drink plenty of fluids

## 2012-06-21 ENCOUNTER — Telehealth: Payer: Self-pay | Admitting: Family Medicine

## 2012-06-21 ENCOUNTER — Telehealth: Payer: Self-pay | Admitting: Internal Medicine

## 2012-06-21 LAB — URINE CULTURE

## 2012-06-21 MED ORDER — CIPROFLOXACIN HCL 500 MG PO TABS: 500.0000 mg | ORAL_TABLET | Freq: Two times a day (BID) | ORAL | Status: DC

## 2012-06-21 NOTE — Telephone Encounter (Signed)
Left message for the pt to return my call. 

## 2012-06-21 NOTE — Telephone Encounter (Signed)
Pt was seen on sat ?uti. Pt is worst. Pt would like ua results

## 2012-06-21 NOTE — Telephone Encounter (Signed)
Patient is requesting refills.  Last seen on 05/25/12 (CPE) and last filled on 04/22/12 #60 with 0 additional refills.  Has a future appt on 11/30/12.  Please advise.  Thanks!!

## 2012-06-21 NOTE — Telephone Encounter (Signed)
Preliminary results show Urine is growing  Bacteria but not a completed result yet .  Findings C/W a uti   But  cannot tell ID or antibiotic sensitivity of the bacteria yet.   If she is having symptoms then should go ahead and take med  In the meantime.

## 2012-06-21 NOTE — Telephone Encounter (Signed)
The patient was not given any antibiotics.  She is requesting something to be called in today.  She is having considerable pain.  Please advise.  Thanks!!

## 2012-06-21 NOTE — Telephone Encounter (Signed)
Please let her know culture looked like a UTI. Called in cipro. She still should follow up with urology if any continued symptoms. Also, if still on coumadin - she needs to let her coumadin clinic know she is taking this antibiotic as it can impact her blood thinner.

## 2012-06-21 NOTE — Telephone Encounter (Signed)
Ok to refill x 1  

## 2012-06-21 NOTE — Telephone Encounter (Signed)
Patient notified by telephone. 

## 2012-06-21 NOTE — Telephone Encounter (Signed)
Dr. Selena Batten did send in Cipro.  Pt notified.

## 2012-06-21 NOTE — Telephone Encounter (Signed)
Sent to [facility] for authorization.  Waiting on a response.

## 2012-06-22 ENCOUNTER — Ambulatory Visit (INDEPENDENT_AMBULATORY_CARE_PROVIDER_SITE_OTHER): Payer: BC Managed Care – PPO | Admitting: General Practice

## 2012-06-22 DIAGNOSIS — R894 Abnormal immunological findings in specimens from other organs, systems and tissues: Secondary | ICD-10-CM

## 2012-06-22 DIAGNOSIS — Z7901 Long term (current) use of anticoagulants: Secondary | ICD-10-CM

## 2012-06-22 DIAGNOSIS — I2699 Other pulmonary embolism without acute cor pulmonale: Secondary | ICD-10-CM

## 2012-06-22 DIAGNOSIS — R760 Raised antibody titer: Secondary | ICD-10-CM

## 2012-06-22 NOTE — Telephone Encounter (Signed)
Called to the pharmacy and left on voicemail. 

## 2012-06-23 ENCOUNTER — Telehealth: Payer: Self-pay | Admitting: Internal Medicine

## 2012-06-23 MED ORDER — CIPROFLOXACIN HCL 500 MG PO TABS: 500.0000 mg | ORAL_TABLET | Freq: Two times a day (BID) | ORAL | Status: DC

## 2012-06-23 NOTE — Telephone Encounter (Signed)
E coli s to cipro  Refill med for 3 more day s    suggest fu with uro if ongoing .

## 2012-06-23 NOTE — Telephone Encounter (Signed)
Medication sent to the pharmacy and pt notified.  She will call back on Friday to let me know how she is doing.

## 2012-06-23 NOTE — Telephone Encounter (Signed)
Pt states she was seen for urinary pain on 06/19/12 and then started on Cipro on 06/21/12.  Pt states she is taking the last dose of the antibiotic tonight (06/23/12) but she is not any better.  Pt is still reporting pain with urination.  Pt denies any blood in urine and denies any fever.  Pt wants to know if antibiotic can be extended for her or does she need to see a urologist.  OFFICE PLEASE FOLLOW UP WITH PATIENT (on cell phone)

## 2012-07-03 ENCOUNTER — Other Ambulatory Visit: Payer: Self-pay

## 2012-07-06 ENCOUNTER — Ambulatory Visit (INDEPENDENT_AMBULATORY_CARE_PROVIDER_SITE_OTHER): Payer: BC Managed Care – PPO | Admitting: Psychology

## 2012-07-06 DIAGNOSIS — F411 Generalized anxiety disorder: Secondary | ICD-10-CM

## 2012-07-09 ENCOUNTER — Ambulatory Visit
Admission: RE | Admit: 2012-07-09 | Discharge: 2012-07-09 | Disposition: A | Discharge status: Home / Self Care | Payer: BC Managed Care – PPO | Source: Ambulatory Visit | Attending: General Surgery | Admitting: General Surgery

## 2012-07-09 ENCOUNTER — Other Ambulatory Visit (INDEPENDENT_AMBULATORY_CARE_PROVIDER_SITE_OTHER): Payer: Self-pay | Admitting: General Surgery

## 2012-07-09 DIAGNOSIS — N6009 Solitary cyst of unspecified breast: Secondary | ICD-10-CM

## 2012-07-20 ENCOUNTER — Other Ambulatory Visit: Payer: BC Managed Care – PPO

## 2012-07-20 ENCOUNTER — Ambulatory Visit (INDEPENDENT_AMBULATORY_CARE_PROVIDER_SITE_OTHER): Payer: BC Managed Care – PPO | Admitting: General Practice

## 2012-07-20 DIAGNOSIS — R760 Raised antibody titer: Secondary | ICD-10-CM

## 2012-07-20 DIAGNOSIS — Z7901 Long term (current) use of anticoagulants: Secondary | ICD-10-CM

## 2012-07-20 DIAGNOSIS — R894 Abnormal immunological findings in specimens from other organs, systems and tissues: Secondary | ICD-10-CM

## 2012-07-20 DIAGNOSIS — I2699 Other pulmonary embolism without acute cor pulmonale: Secondary | ICD-10-CM

## 2012-07-20 LAB — POCT INR: INR: 1.8

## 2012-07-21 ENCOUNTER — Telehealth (INDEPENDENT_AMBULATORY_CARE_PROVIDER_SITE_OTHER): Payer: Self-pay

## 2012-07-21 NOTE — Telephone Encounter (Signed)
Called pt to notify her that her mgm was fine per Dr Dwain Sarna. I asked how the pt has been doing with her breast pain and pt said she is fine today. The pt did have the discomfort a few weeks a go with her breast. I offered to make an appt for the pt to see Dr Dwain Sarna in a few weeks just for a recheck but the pt declined the appt for now. The pt said if she has anymore problems with the discomfort she will call our office.

## 2012-07-28 ENCOUNTER — Other Ambulatory Visit: Payer: Self-pay | Admitting: General Practice

## 2012-07-28 MED ORDER — COUMADIN 2.5 MG PO TABS: 2.5000 mg | ORAL_TABLET | Freq: Every day | ORAL | Status: DC

## 2012-08-17 ENCOUNTER — Other Ambulatory Visit: Payer: Self-pay | Admitting: Internal Medicine

## 2012-08-18 ENCOUNTER — Telehealth: Payer: Self-pay | Admitting: General Practice

## 2012-08-18 ENCOUNTER — Ambulatory Visit (INDEPENDENT_AMBULATORY_CARE_PROVIDER_SITE_OTHER): Payer: BC Managed Care – PPO | Admitting: General Practice

## 2012-08-18 DIAGNOSIS — R760 Raised antibody titer: Secondary | ICD-10-CM

## 2012-08-18 DIAGNOSIS — R894 Abnormal immunological findings in specimens from other organs, systems and tissues: Secondary | ICD-10-CM

## 2012-08-18 DIAGNOSIS — Z7901 Long term (current) use of anticoagulants: Secondary | ICD-10-CM

## 2012-08-18 DIAGNOSIS — I2699 Other pulmonary embolism without acute cor pulmonale: Secondary | ICD-10-CM

## 2012-08-18 NOTE — Telephone Encounter (Signed)
Called patient to re-schedule INR.  Coumadin clinic RN will not be in the office on 5/9.  Need to re-schedule for another day that week.

## 2012-08-19 NOTE — Telephone Encounter (Signed)
Last seen by you: 05/25/12 Last Filled: 06/16/12 #60 with 0 refills Follow up: 11/30/12

## 2012-08-19 NOTE — Telephone Encounter (Signed)
Ok x 1

## 2012-09-20 ENCOUNTER — Other Ambulatory Visit: Payer: Self-pay | Admitting: Internal Medicine

## 2012-09-21 ENCOUNTER — Ambulatory Visit (INDEPENDENT_AMBULATORY_CARE_PROVIDER_SITE_OTHER): Payer: BC Managed Care – PPO | Admitting: General Practice

## 2012-09-21 DIAGNOSIS — R760 Raised antibody titer: Secondary | ICD-10-CM

## 2012-09-21 DIAGNOSIS — R894 Abnormal immunological findings in specimens from other organs, systems and tissues: Secondary | ICD-10-CM

## 2012-09-21 DIAGNOSIS — I2699 Other pulmonary embolism without acute cor pulmonale: Secondary | ICD-10-CM

## 2012-09-21 DIAGNOSIS — Z7901 Long term (current) use of anticoagulants: Secondary | ICD-10-CM

## 2012-09-22 ENCOUNTER — Other Ambulatory Visit: Payer: Self-pay | Admitting: Family Medicine

## 2012-09-29 ENCOUNTER — Ambulatory Visit (INDEPENDENT_AMBULATORY_CARE_PROVIDER_SITE_OTHER): Payer: BC Managed Care – PPO | Admitting: Internal Medicine

## 2012-09-29 ENCOUNTER — Encounter: Payer: Self-pay | Admitting: Internal Medicine

## 2012-09-29 VITALS — BP 114/74 | HR 62 | Temp 97.5°F | Wt 153.0 lb

## 2012-09-29 DIAGNOSIS — M7989 Other specified soft tissue disorders: Secondary | ICD-10-CM

## 2012-09-29 DIAGNOSIS — M069 Rheumatoid arthritis, unspecified: Secondary | ICD-10-CM

## 2012-09-29 DIAGNOSIS — Z7901 Long term (current) use of anticoagulants: Secondary | ICD-10-CM

## 2012-09-29 MED ORDER — DOXYCYCLINE HYCLATE 100 MG PO CAPS: 100.0000 mg | ORAL_CAPSULE | Freq: Two times a day (BID) | ORAL | Status: DC

## 2012-09-29 MED ORDER — MUPIROCIN 2 % EX OINT: TOPICAL_OINTMENT | Freq: Three times a day (TID) | CUTANEOUS | Status: DC

## 2012-09-29 NOTE — Progress Notes (Signed)
Chief Complaint  Patient presents with  . Toe Pain    Rt great toe is red and swollen.    HPI:  Patient comes in with a 1+ today of right great toe redness and swelling with mild tenderness. No specific trauma but did walk 50 minutes the day before. No new manipulations history of same fever. Last    Antibiotic  kelflex  for her recurrent UTIs in February Now off  mtx    For a while but still on plaquenil and her Coumadin. ROS: See pertinent positives and negatives per HPI. No unusual bleeding new flare of her arthritis. Never had this before.  Past Medical History  Diagnosis Date  . Chest pain, unspecified   . Palpitations   . Personal history of venous thrombosis and embolism   . Chronic anticoagulation   . Adjustment disorder with anxiety   . Other and unspecified hyperlipidemia   . B12 deficiency due to diet     is a vegetarian  . Unspecified vitamin D deficiency   . Otalgia, unspecified   . Iron deficiency anemia, unspecified   . Rheumatoid arthritis     [name]  . Headache     migraines and head pain  . Hx of varicella     As Child    Family History  Problem Relation Age of Onset  . Dementia Father   . Stroke Father   . Hyperlipidemia Father   . Diabetes Father   . Parkinsonism Mother   . Lymphoma      History   Social History  . Marital Status: Married    Spouse Name: N/A    Number of Children: N/A  . Years of Education: N/A   Social History Main Topics  . Smoking status: Never Smoker   . Smokeless tobacco: None  . Alcohol Use: No  . Drug Use: No  . Sexually Active: None   Other Topics Concern  . None   Social History Narrative   Regular Exercise-no   20 yrs in [place]   Son at [employer]   From [place].   Married HH  of 3    G1P1             Outpatient Encounter Prescriptions as of 09/29/2012  Medication Sig Dispense Refill  . Cholecalciferol (VITAMIN D3) 2000 UNITS capsule Take 2,000 Units by mouth daily.        Marland Kitchen COUMADIN 2.5 MG tablet  Take 1 tablet (2.5 mg total) by mouth daily. Take as directed by coumadin clinic.  150 tablet  1  . cycloSPORINE (RESTASIS) 0.05 % ophthalmic emulsion Place 1 drop into both eyes daily.        . folic acid (FOLVITE) 1 MG tablet Take 1 mg by mouth 2 (two) times daily.        . hydroxychloroquine (PLAQUENIL) 200 MG tablet Take 200 mg by mouth 2 (two) times daily.        Marland Kitchen loteprednol (LOTEMAX) 0.2 % SUSP 1 drop 4 (four) times daily.      . mineral/vitamin supplement (MULTIGEN) 70 MG TABS Take 1 tablet (70 mg total) by mouth daily.  30 each  11  . NASCOBAL 500 MCG/0.1ML SOLN USE 50 MCG AS DIRECTED INTO NOSE  2 mL  11  . predniSONE (DELTASONE) 1 MG tablet Take 2 mg by mouth daily. 1.5mg  daily      . SUMAtriptan (IMITREX) 50 MG tablet TAKE ONE TABLET BY MOUTH AS NEEDED. MAY REPEAT IN TWO  TO FOUR HOURS IF NEEDED  9 tablet  0  . urea (CARMOL) 40 % CREA Apply topically twice daily  85 each  3  . XANAX 0.25 MG tablet TAKE ONE TABLET BY MOUTH AT BEDTIME AS NEEDED FOR SLEEP  60 tablet  0  . doxycycline (VIBRAMYCIN) 100 MG capsule Take 1 capsule (100 mg total) by mouth 2 (two) times daily.  14 capsule  0  . methotrexate (RHEUMATREX) 2.5 MG tablet Take 20 mg by mouth once a week. Caution:Chemotherapy. Protect from light. 8 tablets weekly.      . mupirocin ointment (BACTROBAN) 2 % Apply topically 3 (three) times daily.  15 g  0  . [DISCONTINUED] ciprofloxacin (CIPRO) 500 MG tablet Take 1 tablet (500 mg total) by mouth 2 (two) times daily.  6 tablet  0  . [DISCONTINUED] ibandronate (BONIVA) 150 MG tablet Take 150 mg by mouth every 30 (thirty) days. Take in the morning with a full glass of water, on an empty stomach, and do not take anything else by mouth or lie down for the next 30 min.      . [DISCONTINUED] trimethoprim (TRIMPEX) 100 MG tablet Take 100 mg by mouth as directed.       No facility-administered encounter medications on file as of 09/29/2012.    EXAM:  BP 114/74  Pulse 62  Temp(Src) 97.5 F  (36.4 C) (Oral)  Wt 153 lb (69.4 kg)  BMI 27.11 kg/m2  SpO2 98%  Body mass index is 27.11 kg/(m^2).  GENERAL: vitals reviewed and listed above, alert, oriented, appears well hydrated and in no acute distress  MS: moves all extremities  right great toe with distal medial redness warmth and swelling. No pinpoint lesion Poss abscess or nail deformity. MTP and phalangeal joints appear to be not involved. No ulcers normal pulses neurovascular appears intact  PSYCH: pleasant and cooperative, no obvious depression   normal anxiety appropriate questions  ASSESSMENT AND PLAN:  Discussed the following assessment and plan:  Toe swelling - r great poss early paronychia without locaization at risk not gout or ingowing nail  Smoking avoiding trauma local care and antibiotic because of high risk for infection on immunosuppressives. Observe over time. We'll use doxycycline and Bactroban. Antibiotic can't interfere with Coumadin but her levels were low last time at 1.4, Coumadin clinic be aware don't really think he needs to be rechecked. -Patient advised to return or notify health care team  if symptoms worsen or persist or new concerns arise.  Patient Instructions  Soak warm  Water 2-3 x per day 5-10 minutes   Can use antibiotic ointment topical.  Can add antibiotic for possible infection.    Expect improvement in the next 3-4 days.   Contact us if not getting better . Good shoe wear     Neta Mends. Panosh M.D.

## 2012-09-29 NOTE — Patient Instructions (Addendum)
Soak warm  Water 2-3 x per day 5-10 minutes   Can use antibiotic ointment topical.  Can add antibiotic for possible infection.    Expect improvement in the next 3-4 days.   Contact us if not getting better . Good shoe wear

## 2012-10-04 ENCOUNTER — Telehealth: Payer: Self-pay | Admitting: Internal Medicine

## 2012-10-04 NOTE — Telephone Encounter (Signed)
Would like   Her rheumatologist  To see her toe.

## 2012-10-04 NOTE — Telephone Encounter (Signed)
Pt was here last Wed and rx'd abx. She is still taking it - 2 days left on it. However, she is feeling no better and was told to call Dr. Fabian Sharp if that was the case. Please advise.

## 2012-10-04 NOTE — Telephone Encounter (Signed)
Pt also wants to know - she has appt with her rhuematologist this Wed - should she go ahead to that appt, or no?

## 2012-10-05 NOTE — Telephone Encounter (Signed)
Pt.notified

## 2012-10-05 NOTE — Telephone Encounter (Signed)
Patient notified by telephone. 

## 2012-10-05 NOTE — Telephone Encounter (Signed)
You must complete your note in order to close this telephone call.

## 2012-10-27 ENCOUNTER — Ambulatory Visit (INDEPENDENT_AMBULATORY_CARE_PROVIDER_SITE_OTHER): Payer: BC Managed Care – PPO | Admitting: Family Medicine

## 2012-10-27 DIAGNOSIS — R894 Abnormal immunological findings in specimens from other organs, systems and tissues: Secondary | ICD-10-CM

## 2012-10-27 DIAGNOSIS — R760 Raised antibody titer: Secondary | ICD-10-CM

## 2012-10-27 DIAGNOSIS — Z7901 Long term (current) use of anticoagulants: Secondary | ICD-10-CM

## 2012-10-27 DIAGNOSIS — I2699 Other pulmonary embolism without acute cor pulmonale: Secondary | ICD-10-CM

## 2012-10-27 LAB — POCT INR: INR: 1.3

## 2012-11-23 ENCOUNTER — Other Ambulatory Visit (INDEPENDENT_AMBULATORY_CARE_PROVIDER_SITE_OTHER): Payer: BC Managed Care – PPO

## 2012-11-23 DIAGNOSIS — E538 Deficiency of other specified B group vitamins: Secondary | ICD-10-CM

## 2012-11-23 DIAGNOSIS — D509 Iron deficiency anemia, unspecified: Secondary | ICD-10-CM

## 2012-11-23 LAB — IBC PANEL
Iron: 96 ug/dL (ref 42–145)
Transferrin: 234.3 mg/dL (ref 212.0–360.0)

## 2012-11-23 LAB — FERRITIN: Ferritin: 88.3 ng/mL (ref 10.0–291.0)

## 2012-11-23 LAB — VITAMIN B12: Vitamin B-12: 736 pg/mL (ref 211–911)

## 2012-11-23 LAB — HIGH SENSITIVITY CRP: CRP, High Sensitivity: 5.42 mg/L — ABNORMAL HIGH (ref 0.000–5.000)

## 2012-11-24 ENCOUNTER — Ambulatory Visit (INDEPENDENT_AMBULATORY_CARE_PROVIDER_SITE_OTHER): Payer: BC Managed Care – PPO | Admitting: General Practice

## 2012-11-24 DIAGNOSIS — R760 Raised antibody titer: Secondary | ICD-10-CM

## 2012-11-24 DIAGNOSIS — Z7901 Long term (current) use of anticoagulants: Secondary | ICD-10-CM

## 2012-11-24 DIAGNOSIS — I2699 Other pulmonary embolism without acute cor pulmonale: Secondary | ICD-10-CM

## 2012-11-24 DIAGNOSIS — R894 Abnormal immunological findings in specimens from other organs, systems and tissues: Secondary | ICD-10-CM

## 2012-11-30 ENCOUNTER — Ambulatory Visit: Payer: BC Managed Care – PPO | Admitting: Internal Medicine

## 2012-12-07 ENCOUNTER — Ambulatory Visit: Payer: BC Managed Care – PPO | Admitting: Internal Medicine

## 2012-12-14 ENCOUNTER — Encounter: Payer: Self-pay | Admitting: Internal Medicine

## 2012-12-14 ENCOUNTER — Ambulatory Visit (INDEPENDENT_AMBULATORY_CARE_PROVIDER_SITE_OTHER): Payer: BC Managed Care – PPO | Admitting: Internal Medicine

## 2012-12-14 VITALS — BP 118/70 | HR 58 | Temp 97.5°F | Wt 159.0 lb

## 2012-12-14 DIAGNOSIS — E538 Deficiency of other specified B group vitamins: Secondary | ICD-10-CM

## 2012-12-14 DIAGNOSIS — Z87440 Personal history of urinary (tract) infections: Secondary | ICD-10-CM

## 2012-12-14 DIAGNOSIS — F4322 Adjustment disorder with anxiety: Secondary | ICD-10-CM

## 2012-12-14 DIAGNOSIS — M069 Rheumatoid arthritis, unspecified: Secondary | ICD-10-CM

## 2012-12-14 DIAGNOSIS — M810 Age-related osteoporosis without current pathological fracture: Secondary | ICD-10-CM

## 2012-12-14 DIAGNOSIS — D509 Iron deficiency anemia, unspecified: Secondary | ICD-10-CM

## 2012-12-14 MED ORDER — XANAX 0.25 MG PO TABS: ORAL_TABLET | ORAL | Status: DC

## 2012-12-14 NOTE — Patient Instructions (Addendum)
Agree with trial of fosamax .  Consider getting tdap at any time. preventive visit in 6 month .  Labs .  Weight bearing exercise is good for prevention of progression of osteoporosis

## 2012-12-14 NOTE — Progress Notes (Signed)
Chief Complaint  Patient presents with  . Follow-up    Multiple issues questions labs    HPI: Patient comes in today for follow up of  multiple medical problems.    Since her last visit she has goneOff the mtx  And ? If  uti better. Bad spelling currently no UTIs. Uncertain if the with the medicine change but her arthritis hasn't flared up and for now will remain off of the methotrexate. Asks about the folic acid and would like to stay on that  Dexa -2.8  And -0.8    Spine   was unable to take the Boniva Will be trying  Fosamax    For now  And consider  Prolia.  Per dr D. she shows me her DEXA for review  Uncertain which cardiologist she would see if she needed to is Dr. wall is retiring from his clinical practice. Asks to she should see if needed. Currently no major palpitations is on a when necessary status   ROS: See pertinent positives and negatives per HPI. No syncope new pulmonary symptoms arthritis flares new rashes. Uses Xanax at night to help sleep basically think she's doing as best as she has been in a while.  Past Medical History  Diagnosis Date  . Chest pain, unspecified   . Palpitations   . Personal history of venous thrombosis and embolism   . Chronic anticoagulation   . Adjustment disorder with anxiety   . Other and unspecified hyperlipidemia   . B12 deficiency due to diet     is a vegetarian  . Unspecified vitamin D deficiency   . Otalgia, unspecified   . Iron deficiency anemia, unspecified   . Rheumatoid arthritis(714.0)     deveshewar  . Headache(784.0)     migraines and head pain  . Hx of varicella     As Child  . UTI (lower urinary tract infection) 11/28/2011    citrobacter       Family History  Problem Relation Age of Onset  . Dementia Father   . Stroke Father   . Hyperlipidemia Father   . Diabetes Father   . Parkinsonism Mother   . Lymphoma      History   Social History  . Marital Status: Married    Spouse Name: N/A    Number of Children:  N/A  . Years of Education: N/A   Social History Main Topics  . Smoking status: Never Smoker   . Smokeless tobacco: None  . Alcohol Use: No  . Drug Use: No  . Sexually Active: None   Other Topics Concern  . None   Social History Narrative   Regular Exercise-no   20 yrs in [place]   Son at [facility] [employer]   From [place].   Married HH  of 3    G1P1             Outpatient Encounter Prescriptions as of 12/14/2012  Medication Sig Dispense Refill  . Cholecalciferol (VITAMIN D3) 2000 UNITS capsule Take 2,000 Units by mouth daily.        Marland Kitchen COUMADIN 2.5 MG tablet Take 1 tablet (2.5 mg total) by mouth daily. Take as directed by coumadin clinic.  150 tablet  1  . cycloSPORINE (RESTASIS) 0.05 % ophthalmic emulsion Place 1 drop into both eyes daily.        . folic acid (FOLVITE) 1 MG tablet Take 1 mg by mouth 2 (two) times daily.        Marland Kitchen  hydroxychloroquine (PLAQUENIL) 200 MG tablet Take 200 mg by mouth 2 (two) times daily.        Marland Kitchen loteprednol (LOTEMAX) 0.2 % SUSP 1 drop 4 (four) times daily.      . mineral/vitamin supplement (MULTIGEN) 70 MG TABS Take 1 tablet (70 mg total) by mouth daily.  30 each  11  . NASCOBAL 500 MCG/0.1ML SOLN USE 50 MCG AS DIRECTED INTO NOSE  2 mL  11  . predniSONE (DELTASONE) 1 MG tablet Take 2 mg by mouth daily. 1.5mg  daily      . SUMAtriptan (IMITREX) 50 MG tablet TAKE ONE TABLET BY MOUTH AS NEEDED. MAY REPEAT IN TWO TO FOUR HOURS IF NEEDED  9 tablet  0  . urea (CARMOL) 40 % CREA Apply topically twice daily  85 each  3  . XANAX 0.25 MG tablet TAKE ONE TABLET BY MOUTH AT BEDTIME AS NEEDED FOR SLEEP ,anxiety  60 tablet  2  . [DISCONTINUED] mupirocin ointment (BACTROBAN) 2 % Apply topically 3 (three) times daily.  15 g  0  . [DISCONTINUED] XANAX 0.25 MG tablet TAKE ONE TABLET BY MOUTH AT BEDTIME AS NEEDED FOR SLEEP  60 tablet  0  . [DISCONTINUED] doxycycline (VIBRAMYCIN) 100 MG capsule Take 1 capsule (100 mg total) by mouth 2 (two) times daily.  14 capsule  0  . [DISCONTINUED]  methotrexate (RHEUMATREX) 2.5 MG tablet Take 20 mg by mouth once a week. Caution:Chemotherapy. Protect from light. 8 tablets weekly.       No facility-administered encounter medications on file as of 12/14/2012.    EXAM:  BP 118/70  Pulse 58  Temp(Src) 97.5 F (36.4 C) (Oral)  Wt 159 lb (72.122 kg)  BMI 28.17 kg/m2  SpO2 98%  Body mass index is 28.17 kg/(m^2).  GENERAL: vitals reviewed and listed above, alert, oriented, appears well hydrated and in no acute distress  HEENT: atraumatic, conjunctiva  clear, no obvious abnormalities on inspection of external nose and ears  NECK: no obvious masses on inspection palpation  LUNGS: clear to auscultation bilaterally, no wheezes, rales or rhonchi, good air movement CV: HRRR, no clubbing cyanosis or  peripheral edema nl cap refill  Abdomen:  Sof,t normal bowel sounds without hepatosplenomegaly, no guarding rebound or masses no CVA tenderness MS: moves all extremities without noticeable focal  abnormality PSYCH: pleasant and cooperative, no obvious depression o mild baseline anxiety appropriate for her  Lab Results  Component Value Date   WBC 7.3 05/21/2012   HGB 13.6 05/21/2012   HCT 41.6 05/21/2012   PLT 235.0 05/21/2012   GLUCOSE 91 05/21/2012   CHOL 193 05/21/2012   TRIG 128.0 05/21/2012   HDL 81.00 05/21/2012   LDLCALC 86 05/21/2012   ALT 26 05/21/2012   AST 23 05/21/2012   NA 142 05/21/2012   K 4.6 05/21/2012   CL 106 05/21/2012   CREATININE 0.8 05/21/2012   BUN 12 05/21/2012   CO2 30 05/21/2012   TSH 0.91 05/21/2012   INR 1.5 11/24/2012    ASSESSMENT AND PLAN:  Discussed the following assessment and plan:  IRON DEFICIENCY  VITAMIN B12 DEFICIENCY  RHEUMATOID ARTHRITIS  History of recurrent UTI (urinary tract infection)  Osteoporosis  Adjustment disorder with anxiety - Stable at this point is taking Xanax at night for sleep/ patient aware risk-benefit medication Fortunately UTIs are improved anxiety appears to be fairly stable osteoporosis is  problematic as her hip DEXA went from -2.5 to -2.8 hasn't been able to tolerate oral medicines is  going to try Fosamax. Second line would be pearly asks my opinion. Iron deficiency is stable no high iron levels can recheck in 6 months. B12 is a very good level no change. -Patient advised to return or notify health care team  if symptoms worsen or persist or new concerns arise.  Patient Instructions  Agree with trial of fosamax .  Consider getting tdap at any time. preventive visit in 6 month .  Labs .  Weight bearing exercise is good for prevention of progression of osteoporosis   Neta Mends. Concepcion Kirkpatrick M.D.  Total visit > 50% spent counseling and coordinating care

## 2012-12-22 ENCOUNTER — Ambulatory Visit (INDEPENDENT_AMBULATORY_CARE_PROVIDER_SITE_OTHER): Payer: BC Managed Care – PPO | Admitting: General Practice

## 2012-12-22 DIAGNOSIS — R894 Abnormal immunological findings in specimens from other organs, systems and tissues: Secondary | ICD-10-CM

## 2012-12-22 DIAGNOSIS — R760 Raised antibody titer: Secondary | ICD-10-CM

## 2012-12-22 DIAGNOSIS — I2699 Other pulmonary embolism without acute cor pulmonale: Secondary | ICD-10-CM

## 2012-12-22 DIAGNOSIS — Z7901 Long term (current) use of anticoagulants: Secondary | ICD-10-CM

## 2012-12-30 ENCOUNTER — Ambulatory Visit (INDEPENDENT_AMBULATORY_CARE_PROVIDER_SITE_OTHER): Payer: BC Managed Care – PPO | Admitting: Psychology

## 2012-12-30 DIAGNOSIS — F411 Generalized anxiety disorder: Secondary | ICD-10-CM

## 2013-01-20 ENCOUNTER — Ambulatory Visit (INDEPENDENT_AMBULATORY_CARE_PROVIDER_SITE_OTHER): Payer: BC Managed Care – PPO | Admitting: General Practice

## 2013-01-20 ENCOUNTER — Other Ambulatory Visit: Payer: Self-pay | Admitting: General Practice

## 2013-01-20 DIAGNOSIS — R760 Raised antibody titer: Secondary | ICD-10-CM

## 2013-01-20 DIAGNOSIS — I2699 Other pulmonary embolism without acute cor pulmonale: Secondary | ICD-10-CM

## 2013-01-20 DIAGNOSIS — Z7901 Long term (current) use of anticoagulants: Secondary | ICD-10-CM

## 2013-01-20 DIAGNOSIS — R894 Abnormal immunological findings in specimens from other organs, systems and tissues: Secondary | ICD-10-CM

## 2013-01-20 MED ORDER — COUMADIN 2.5 MG PO TABS: ORAL_TABLET | ORAL | Status: DC

## 2013-01-24 ENCOUNTER — Other Ambulatory Visit: Payer: Self-pay | Admitting: Internal Medicine

## 2013-01-31 ENCOUNTER — Other Ambulatory Visit (INDEPENDENT_AMBULATORY_CARE_PROVIDER_SITE_OTHER): Payer: Self-pay | Admitting: General Surgery

## 2013-01-31 ENCOUNTER — Other Ambulatory Visit: Payer: Self-pay | Admitting: Obstetrics and Gynecology

## 2013-01-31 DIAGNOSIS — N6320 Unspecified lump in the left breast, unspecified quadrant: Secondary | ICD-10-CM

## 2013-02-17 ENCOUNTER — Ambulatory Visit (INDEPENDENT_AMBULATORY_CARE_PROVIDER_SITE_OTHER): Payer: BC Managed Care – PPO | Admitting: General Practice

## 2013-02-17 DIAGNOSIS — Z7901 Long term (current) use of anticoagulants: Secondary | ICD-10-CM

## 2013-02-17 DIAGNOSIS — R894 Abnormal immunological findings in specimens from other organs, systems and tissues: Secondary | ICD-10-CM

## 2013-02-17 DIAGNOSIS — I2699 Other pulmonary embolism without acute cor pulmonale: Secondary | ICD-10-CM

## 2013-02-17 DIAGNOSIS — R760 Raised antibody titer: Secondary | ICD-10-CM

## 2013-02-17 LAB — POCT INR: INR: 1.6

## 2013-02-21 ENCOUNTER — Other Ambulatory Visit: Payer: Self-pay | Admitting: Internal Medicine

## 2013-03-17 ENCOUNTER — Ambulatory Visit (INDEPENDENT_AMBULATORY_CARE_PROVIDER_SITE_OTHER): Payer: BC Managed Care – PPO | Admitting: General Practice

## 2013-03-17 DIAGNOSIS — I2699 Other pulmonary embolism without acute cor pulmonale: Secondary | ICD-10-CM

## 2013-03-17 DIAGNOSIS — Z7901 Long term (current) use of anticoagulants: Secondary | ICD-10-CM

## 2013-03-17 DIAGNOSIS — R894 Abnormal immunological findings in specimens from other organs, systems and tissues: Secondary | ICD-10-CM

## 2013-03-17 DIAGNOSIS — R760 Raised antibody titer: Secondary | ICD-10-CM

## 2013-03-17 LAB — POCT INR: INR: 1.5

## 2013-03-21 ENCOUNTER — Ambulatory Visit
Admission: RE | Admit: 2013-03-21 | Discharge: 2013-03-21 | Disposition: A | Discharge status: Home / Self Care | Payer: BC Managed Care – PPO | Source: Ambulatory Visit | Attending: Obstetrics and Gynecology | Admitting: Obstetrics and Gynecology

## 2013-03-21 DIAGNOSIS — N6320 Unspecified lump in the left breast, unspecified quadrant: Secondary | ICD-10-CM

## 2013-03-24 ENCOUNTER — Other Ambulatory Visit: Payer: Self-pay

## 2013-04-13 ENCOUNTER — Ambulatory Visit (INDEPENDENT_AMBULATORY_CARE_PROVIDER_SITE_OTHER): Payer: BC Managed Care – PPO | Admitting: General Practice

## 2013-04-13 DIAGNOSIS — I2699 Other pulmonary embolism without acute cor pulmonale: Secondary | ICD-10-CM

## 2013-04-13 DIAGNOSIS — R894 Abnormal immunological findings in specimens from other organs, systems and tissues: Secondary | ICD-10-CM

## 2013-04-13 DIAGNOSIS — R760 Raised antibody titer: Secondary | ICD-10-CM

## 2013-04-13 DIAGNOSIS — Z7901 Long term (current) use of anticoagulants: Secondary | ICD-10-CM

## 2013-04-13 LAB — POCT INR: INR: 1.4

## 2013-04-13 NOTE — Progress Notes (Signed)
Pre-visit discussion using our clinic review tool. No additional management support is needed unless otherwise documented below in the visit note.  

## 2013-04-19 ENCOUNTER — Ambulatory Visit (INDEPENDENT_AMBULATORY_CARE_PROVIDER_SITE_OTHER): Payer: BC Managed Care – PPO

## 2013-04-19 DIAGNOSIS — Z23 Encounter for immunization: Secondary | ICD-10-CM

## 2013-05-06 ENCOUNTER — Encounter: Payer: Self-pay | Admitting: *Deleted

## 2013-05-09 ENCOUNTER — Ambulatory Visit (INDEPENDENT_AMBULATORY_CARE_PROVIDER_SITE_OTHER): Payer: BC Managed Care – PPO | Admitting: General Practice

## 2013-05-09 DIAGNOSIS — I2699 Other pulmonary embolism without acute cor pulmonale: Secondary | ICD-10-CM

## 2013-05-09 DIAGNOSIS — R894 Abnormal immunological findings in specimens from other organs, systems and tissues: Secondary | ICD-10-CM

## 2013-05-09 DIAGNOSIS — R760 Raised antibody titer: Secondary | ICD-10-CM

## 2013-05-09 DIAGNOSIS — Z7901 Long term (current) use of anticoagulants: Secondary | ICD-10-CM

## 2013-05-09 LAB — POCT INR: INR: 1.6

## 2013-05-09 NOTE — Progress Notes (Signed)
Pre-visit discussion using our clinic review tool. No additional management support is needed unless otherwise documented below in the visit note.  

## 2013-05-13 ENCOUNTER — Other Ambulatory Visit: Payer: Self-pay | Admitting: Internal Medicine

## 2013-05-24 ENCOUNTER — Other Ambulatory Visit: Payer: BC Managed Care – PPO

## 2013-05-25 ENCOUNTER — Other Ambulatory Visit (INDEPENDENT_AMBULATORY_CARE_PROVIDER_SITE_OTHER): Payer: BC Managed Care – PPO

## 2013-05-25 DIAGNOSIS — Z0000 Encounter for general adult medical examination without abnormal findings: Secondary | ICD-10-CM

## 2013-05-25 DIAGNOSIS — D509 Iron deficiency anemia, unspecified: Secondary | ICD-10-CM

## 2013-05-25 LAB — POCT URINALYSIS DIPSTICK
Bilirubin, UA: NEGATIVE
Glucose, UA: NEGATIVE
Ketones, UA: NEGATIVE
Nitrite, UA: NEGATIVE
PROTEIN UA: NEGATIVE
Spec Grav, UA: 1.02
UROBILINOGEN UA: 0.2
pH, UA: 5.5

## 2013-05-25 LAB — BASIC METABOLIC PANEL
BUN: 12 mg/dL (ref 6–23)
CO2: 27 mEq/L (ref 19–32)
Calcium: 8.9 mg/dL (ref 8.4–10.5)
Chloride: 104 mEq/L (ref 96–112)
Creatinine, Ser: 0.7 mg/dL (ref 0.4–1.2)
GFR: 88.01 mL/min (ref >60.00)
Glucose, Bld: 77 mg/dL (ref 70–99)
POTASSIUM: 3.9 meq/L (ref 3.5–5.1)
SODIUM: 139 meq/L (ref 135–145)

## 2013-05-25 LAB — CBC WITH DIFFERENTIAL/PLATELET
BASOS PCT: 0.3 % (ref 0.0–3.0)
Basophils Absolute: 0 10*3/uL (ref 0.0–0.1)
Eosinophils Absolute: 0.1 10*3/uL (ref 0.0–0.7)
Eosinophils Relative: 1.2 % (ref 0.0–5.0)
HEMATOCRIT: 41.5 % (ref 36.0–46.0)
HEMOGLOBIN: 13.7 g/dL (ref 12.0–15.0)
LYMPHS ABS: 3.4 10*3/uL (ref 0.7–4.0)
Lymphocytes Relative: 40.5 % (ref 12.0–46.0)
MCHC: 33.1 g/dL (ref 30.0–36.0)
MCV: 82.7 fl (ref 78.0–100.0)
MONO ABS: 0.7 10*3/uL (ref 0.1–1.0)
Monocytes Relative: 7.9 % (ref 3.0–12.0)
NEUTROS ABS: 4.2 10*3/uL (ref 1.4–7.7)
Neutrophils Relative %: 50.1 % (ref 43.0–77.0)
Platelets: 213 10*3/uL (ref 150.0–400.0)
RBC: 5.01 Mil/uL (ref 3.87–5.11)
RDW: 13.2 % (ref 11.5–14.6)
WBC: 8.4 10*3/uL (ref 4.5–10.5)

## 2013-05-25 LAB — IBC PANEL
Iron: 79 ug/dL (ref 42–145)
Saturation Ratios: 22.8 % (ref 20.0–50.0)
TRANSFERRIN: 248 mg/dL (ref 212.0–360.0)

## 2013-05-25 LAB — HEPATIC FUNCTION PANEL
ALT: 30 U/L (ref 0–35)
AST: 25 U/L (ref 0–37)
Albumin: 3.9 g/dL (ref 3.5–5.2)
Alkaline Phosphatase: 77 U/L (ref 39–117)
BILIRUBIN DIRECT: 0.1 mg/dL (ref 0.0–0.3)
BILIRUBIN TOTAL: 0.9 mg/dL (ref 0.3–1.2)
Total Protein: 7.1 g/dL (ref 6.0–8.3)

## 2013-05-25 LAB — LIPID PANEL
CHOL/HDL RATIO: 2
Cholesterol: 205 mg/dL — ABNORMAL HIGH (ref 0–200)
HDL: 84.9 mg/dL (ref >39.00)
Triglycerides: 96 mg/dL (ref 0.0–149.0)
VLDL: 19.2 mg/dL (ref 0.0–40.0)

## 2013-05-25 LAB — VITAMIN B12: Vitamin B-12: 540 pg/mL (ref 211–911)

## 2013-05-25 LAB — FERRITIN: Ferritin: 79.7 ng/mL (ref 10.0–291.0)

## 2013-05-25 LAB — TSH: TSH: 3.04 u[IU]/mL (ref 0.35–5.50)

## 2013-05-25 LAB — C-REACTIVE PROTEIN: CRP: 0.9 mg/dL (ref 0.5–20.0)

## 2013-05-25 LAB — LDL CHOLESTEROL, DIRECT: LDL DIRECT: 97.3 mg/dL

## 2013-05-31 ENCOUNTER — Encounter: Payer: Self-pay | Admitting: Internal Medicine

## 2013-05-31 ENCOUNTER — Telehealth: Payer: Self-pay | Admitting: Internal Medicine

## 2013-05-31 ENCOUNTER — Ambulatory Visit (INDEPENDENT_AMBULATORY_CARE_PROVIDER_SITE_OTHER): Payer: BC Managed Care – PPO | Admitting: Internal Medicine

## 2013-05-31 VITALS — BP 104/70 | HR 64 | Temp 97.5°F | Ht 62.75 in | Wt 163.0 lb

## 2013-05-31 DIAGNOSIS — D509 Iron deficiency anemia, unspecified: Secondary | ICD-10-CM

## 2013-05-31 DIAGNOSIS — Z23 Encounter for immunization: Secondary | ICD-10-CM

## 2013-05-31 DIAGNOSIS — E538 Deficiency of other specified B group vitamins: Secondary | ICD-10-CM

## 2013-05-31 DIAGNOSIS — M069 Rheumatoid arthritis, unspecified: Secondary | ICD-10-CM

## 2013-05-31 DIAGNOSIS — Z0000 Encounter for general adult medical examination without abnormal findings: Secondary | ICD-10-CM

## 2013-05-31 DIAGNOSIS — E611 Iron deficiency: Secondary | ICD-10-CM | POA: Insufficient documentation

## 2013-05-31 DIAGNOSIS — F4322 Adjustment disorder with anxiety: Secondary | ICD-10-CM

## 2013-05-31 DIAGNOSIS — M810 Age-related osteoporosis without current pathological fracture: Secondary | ICD-10-CM

## 2013-05-31 DIAGNOSIS — Z7901 Long term (current) use of anticoagulants: Secondary | ICD-10-CM

## 2013-05-31 MED ORDER — XANAX 0.25 MG PO TABS: ORAL_TABLET | ORAL | Status: DC

## 2013-05-31 NOTE — Telephone Encounter (Signed)
I made pre-visit lab appt for patient scheduled for 07/13, but no lab orders to attach to it.Thanks!

## 2013-05-31 NOTE — Progress Notes (Signed)
Chief Complaint  Patient presents with  . Annual Exam    many issues to fu ,iron def RA ? about vone health anxiety sleep med     HPI: Patient comes in today for Preventive Health Care visit   Has multiple med conditions   Stopped mtx and bladder   Better  Joints a bit more stiff in am hands etc   Now on  Plaquenil and low dose  Pred.  ? Better .  ? From boniva   Last 6 months  Did a bone density :   -2.8  Went to fosamax weekly asks opinion about this vs Actonel etc. And risk of atypical fx .   ? About immunize got flu vaccine this year due for tdap ? Every had varicella ok to get vaccine per dr D.   Breast feel full but no dc got letter neg Mammo but has dense breast.   Taking vit d 1000 + in vitamin ca one a day? 800   Anxiety sleep taking q hs wants brand    No clotting   On coumadin no bleeding  Had eye exam ? Had cataract of no consequence.   Health Maintenance  Topic Date Due  . Tetanus/tdap  06/30/1977  . Influenza Vaccine  12/17/2013  . Mammogram  03/22/2015  . Pap Smear  06/17/2015  . Colonoscopy  05/19/2016   Health Maintenance Review   ROS:  GEN/ HEENT: No fever, significant weight changes sweats headaches vision problems hearing changes, CV/ PULM; No chest pain shortness of breath cough, syncope,edema  change in exercise tolerance. GI /GU: No adominal pain, vomiting, change in bowel habits. No blood in the stool. No significant GU symptoms. SKIN/HEME: ,no acute skin rashes suspicious lesions or bleeding. No lymphadenopathy, nodules, masses.  NEURO/ PSYCH:  No neurologic signs such as weakness numbness. No depression anxiety. IMM/ Allergy: No unusual infections.  Allergy .   REST of 12 system review negative except as per HPI   Past Medical History  Diagnosis Date  . Chest pain, unspecified   . Palpitations   . Personal history of venous thrombosis and embolism   . Chronic anticoagulation   . Adjustment disorder with anxiety   . Other and  unspecified hyperlipidemia   . B12 deficiency due to diet     is a vegetarian  . Unspecified vitamin D deficiency   . Otalgia, unspecified   . Iron deficiency anemia, unspecified   . Rheumatoid arthritis(714.0)     deveshewar  . Headache(784.0)     migraines and head pain  . Hx of varicella     As Child  . UTI (lower urinary tract infection) 11/28/2011    citrobacter       Family History  Problem Relation Age of Onset  . Dementia Father   . Stroke Father   . Hyperlipidemia Father   . Diabetes Father   . Parkinsonism Mother   . Lymphoma      History   Social History  . Marital Status: Married    Spouse Name: N/A    Number of Children: N/A  . Years of Education: N/A   Social History Main Topics  . Smoking status: Never Smoker   . Smokeless tobacco: None  . Alcohol Use: No  . Drug Use: No  . Sexual Activity: None   Other Topics Concern  . None   Social History Narrative   Regular Exercise-no   20 yrs in [place] at [employer]  From [place].   Married HH  of 3    G1P1             Outpatient Encounter Prescriptions as of 05/31/2013  Medication Sig  . alendronate (FOSAMAX) 70 MG tablet   . Calcium Carb-Cholecalciferol (CALTRATE 600+D) 600-800 MG-UNIT TABS Take by mouth.  . Cholecalciferol (VITAMIN D-3) 1000 UNITS CAPS Take by mouth.  . COUMADIN 2.5 MG tablet Take as directed by coumadin clinic.  . cycloSPORINE (RESTASIS) 0.05 % ophthalmic emulsion Place 1 drop into both eyes daily.    . folic acid (FOLVITE) 1 MG tablet Take 1 mg by mouth 2 (two) times daily.    . hydroxychloroquine (PLAQUENIL) 200 MG tablet Take 200 mg by mouth 2 (two) times daily.    Marland Kitchen loteprednol (LOTEMAX) 0.2 % SUSP 1 drop 4 (four) times daily.  . mineral/vitamin supplement (MULTIGEN) 70 MG TABS tablet TAKE ONE TABLET BY MOUTH DAILY.  Marland Kitchen NASCOBAL 500 MCG/0.1ML SOLN USE 50 MCG AS DIRECTED INTO THE NOSE  . predniSONE (DELTASONE) 1 MG tablet daily. 1.5mg  daily  . SUMAtriptan (IMITREX) 50 MG  tablet TAKE ONE TABLET BY MOUTH AS NEEDED. MAY REPEAT IN TWO TO FOUR HOURS IF NEEDED  . urea (CARMOL) 40 % CREA Apply topically twice daily  . XANAX 0.25 MG tablet TAKE ONE TABLET BY MOUTH AT BEDTIME AS NEEDED FOR SLEEP ,anxiety  . [DISCONTINUED] XANAX 0.25 MG tablet TAKE ONE TABLET BY MOUTH AT BEDTIME AS NEEDED FOR SLEEP ,anxiety  . [DISCONTINUED] Cholecalciferol (VITAMIN D3) 2000 UNITS capsule Take 2,000 Units by mouth daily.      EXAM:  BP 104/70  Pulse 64  Temp(Src) 97.5 F (36.4 C) (Oral)  Ht 5' 2.75" (1.594 m)  Wt 163 lb (73.936 kg)  BMI 29.10 kg/m2  SpO2 98%  Body mass index is 29.1 kg/(m^2).  Physical Exam: Vital signs reviewed BRA:XENM is a well-developed well-nourished alert cooperative   female who appears her stated age in no acute distress.  HEENT: normocephalic atraumatic , Eyes: PERRL EOM's full, conjunctiva clear, Nares: paten,t no deformity discharge or tenderness., Ears: no deformity EAC's clear TMs with normal landmarks. Mouth: clear OP, no lesions, edema.  Moist mucous membranes. Dentition in adequate repair. NECK: supple without masses, thyromegaly or bruits. CHEST/PULM:  Clear to auscultation and percussion breath sounds equal no wheeze , rales or rhonchi. No chest wall deformities or tenderness. Mild kyphosis  Breast: normal by inspection . No dimpling, discharge, masses, tenderness or discharge . CV: PMI is nondisplaced, S1 S2 no gallops, murmurs, rubs. Peripheral pulses are full without delay.No JVD .  ABDOMEN: Bowel sounds normal nontender  No guard or rebound, no hepato splenomegal no CVA tenderness.  No hernia. Extremtities:  No clubbing cyanosis or edema, no acute joint swelling or redness no focal atrophy NEURO:  Oriented x3, cranial nerves 3-12 appear to be intact, no obvious focal weakness,gait within normal limits SKIN: No acute rashes normal turgor, color, no bruising or petechiae. PSYCH: Oriented, good eye contact, no obvious depression  Minimal  baseline anxiety, cognition and judgment appear normal. LN: no cervical axillary adenopathy  Lab Results  Component Value Date   WBC 8.4 05/25/2013   HGB 13.7 05/25/2013   HCT 41.5 05/25/2013   PLT 213.0 05/25/2013   GLUCOSE 77 05/25/2013   CHOL 205* 05/25/2013   TRIG 96.0 05/25/2013   HDL 84.90 05/25/2013   LDLDIRECT 97.3 05/25/2013   LDLCALC 86 05/21/2012   ALT 30 05/25/2013   AST 25 05/25/2013  NA 139 05/25/2013   K 3.9 05/25/2013   CL 104 05/25/2013   CREATININE 0.7 05/25/2013   BUN 12 05/25/2013   CO2 27 05/25/2013   TSH 3.04 05/25/2013   INR 1.6 05/09/2013    ASSESSMENT AND PLAN:  Discussed the following assessment and plan:  Visit for preventive health examination  Iron deficiency  Chronic anticoagulation  Rheumatoid arthritis(714.0)  Adjustment disorder with anxiety - curerntly benefiot more than risk with xananx  other meds may have se or interfere with  coagulation  B12 deficiency  Need for Tdap vaccination - Plan: Tdap vaccine greater than or equal to 7yo IM  Osteoporosis - dexa -2.8 hip -1.5 spine 2014 july  IRON DEFICIENCY  VITAMIN B12 DEFICIENCY Consider prevnar etc  Counseled regarding healthy nutrition, exercise, sleep, injury prevention, calcium vit d and healthy weight . Disc about labs HCM immuniz ?s  And  Advice that she requests . At this time do not advise further wu for breast feeling full.  If  persistent or progressive or nodules then  Recheck with gyne etc.  Due for vit d and b12 level at next lab.  Patient Care Team: Burnis Medin, MD as PCP - General Christoper Allegra, MD (Rheumatology) Mayme Genta, MD (Gastroenterology) Renella Cunas, MD (Cardiology) Ascencion Dike, MD (Otolaryngology) Larey Seat, MD (Neurology) Naoma Diener, MD (Obstetrics and Gynecology) Selinda Orion, MD (Dermatology) Ailene Rud, MD as Attending Physician (Urology) Patient Instructions  i think you are doing well., Cholesterol is excellent.  We should get a vit d  level at the next lab tests in 6 months   Vit d , B12, IBC, and ferritin . Magnesium   CRP .  In 6 months  And ROV  Reasonable to try   Fosamax      benefit more than risk in the short run.  No new action about the breast fullness feeling . If progressive  Or nodules lumps then  Reevaluate. Ok to remain on f xanax brand for sleep and anxiety at this time consider getting prevnar 13 and the next year pneumovax   tdap today  In 10 years get td  Colonoscopy every 10 years or 5 years depending on risk poiyps etc.     Standley Brooking. Jannely Henthorn M.D.   Pre visit review using our clinic review tool, if applicable. No additional management support is needed unless otherwise documented below in the visit note.

## 2013-05-31 NOTE — Patient Instructions (Addendum)
i think you are doing well., Cholesterol is excellent.  We should get a vit d level at the next lab tests in 6 months   Vit d , B12, IBC, and ferritin . Magnesium   CRP .  In 6 months  And ROV  Reasonable to try   Fosamax      benefit more than risk in the short run.  No new action about the breast fullness feeling . If progressive  Or nodules lumps then  Reevaluate. Ok to remain on f xanax brand for sleep and anxiety at this time consider getting prevnar 13 and the next year pneumovax   tdap today  In 10 years get td  Colonoscopy every 10 years or 5 years depending on risk poiyps etc.

## 2013-06-01 NOTE — Telephone Encounter (Signed)
Vit d , B12, IBC, and ferritin . Magnesium CRP . In 6 months And ROV  Thank you for doing this dx iron deffic vit d defic  b12 defic

## 2013-06-03 ENCOUNTER — Other Ambulatory Visit: Payer: Self-pay | Admitting: Family Medicine

## 2013-06-03 DIAGNOSIS — E559 Vitamin D deficiency, unspecified: Secondary | ICD-10-CM

## 2013-06-03 DIAGNOSIS — E538 Deficiency of other specified B group vitamins: Secondary | ICD-10-CM

## 2013-06-03 DIAGNOSIS — E611 Iron deficiency: Secondary | ICD-10-CM

## 2013-06-03 NOTE — Telephone Encounter (Signed)
I entered the lab work. Please make appointments.  Thanks!

## 2013-06-09 ENCOUNTER — Ambulatory Visit (INDEPENDENT_AMBULATORY_CARE_PROVIDER_SITE_OTHER): Payer: BC Managed Care – PPO | Admitting: General Practice

## 2013-06-09 ENCOUNTER — Ambulatory Visit: Payer: BC Managed Care – PPO

## 2013-06-09 DIAGNOSIS — R894 Abnormal immunological findings in specimens from other organs, systems and tissues: Secondary | ICD-10-CM

## 2013-06-09 DIAGNOSIS — I2699 Other pulmonary embolism without acute cor pulmonale: Secondary | ICD-10-CM

## 2013-06-09 DIAGNOSIS — R760 Raised antibody titer: Secondary | ICD-10-CM

## 2013-06-09 DIAGNOSIS — Z7901 Long term (current) use of anticoagulants: Secondary | ICD-10-CM

## 2013-06-09 DIAGNOSIS — Z5181 Encounter for therapeutic drug level monitoring: Secondary | ICD-10-CM

## 2013-06-09 LAB — POCT INR: INR: 1.5

## 2013-06-09 NOTE — Progress Notes (Signed)
Pre-visit discussion using our clinic review tool. No additional management support is needed unless otherwise documented below in the visit note.  

## 2013-06-15 ENCOUNTER — Other Ambulatory Visit: Payer: Self-pay | Admitting: Obstetrics and Gynecology

## 2013-06-15 ENCOUNTER — Other Ambulatory Visit (HOSPITAL_COMMUNITY)
Admission: RE | Admit: 2013-06-15 | Discharge: 2013-06-15 | Disposition: A | Discharge status: Home / Self Care | Payer: BC Managed Care – PPO | Source: Ambulatory Visit | Attending: Obstetrics and Gynecology | Admitting: Obstetrics and Gynecology

## 2013-06-15 DIAGNOSIS — Z01419 Encounter for gynecological examination (general) (routine) without abnormal findings: Secondary | ICD-10-CM | POA: Insufficient documentation

## 2013-07-07 ENCOUNTER — Other Ambulatory Visit: Payer: Self-pay | Admitting: General Practice

## 2013-07-07 ENCOUNTER — Ambulatory Visit (INDEPENDENT_AMBULATORY_CARE_PROVIDER_SITE_OTHER): Payer: BC Managed Care – PPO | Admitting: General Practice

## 2013-07-07 DIAGNOSIS — R894 Abnormal immunological findings in specimens from other organs, systems and tissues: Secondary | ICD-10-CM

## 2013-07-07 DIAGNOSIS — R760 Raised antibody titer: Secondary | ICD-10-CM

## 2013-07-07 DIAGNOSIS — I2699 Other pulmonary embolism without acute cor pulmonale: Secondary | ICD-10-CM

## 2013-07-07 DIAGNOSIS — Z5181 Encounter for therapeutic drug level monitoring: Secondary | ICD-10-CM

## 2013-07-07 DIAGNOSIS — Z7901 Long term (current) use of anticoagulants: Secondary | ICD-10-CM

## 2013-07-07 LAB — POCT INR: INR: 1.4

## 2013-07-07 MED ORDER — COUMADIN 2.5 MG PO TABS: ORAL_TABLET | ORAL | Status: DC

## 2013-07-07 NOTE — Progress Notes (Signed)
Pre visit review using our clinic review tool, if applicable. No additional management support is needed unless otherwise documented below in the visit note. 

## 2013-07-15 ENCOUNTER — Other Ambulatory Visit: Payer: Self-pay | Admitting: Internal Medicine

## 2013-08-04 ENCOUNTER — Ambulatory Visit (INDEPENDENT_AMBULATORY_CARE_PROVIDER_SITE_OTHER): Payer: BC Managed Care – PPO | Admitting: General Practice

## 2013-08-04 ENCOUNTER — Ambulatory Visit: Payer: BC Managed Care – PPO

## 2013-08-04 DIAGNOSIS — R894 Abnormal immunological findings in specimens from other organs, systems and tissues: Secondary | ICD-10-CM

## 2013-08-04 DIAGNOSIS — Z5181 Encounter for therapeutic drug level monitoring: Secondary | ICD-10-CM

## 2013-08-04 DIAGNOSIS — I2699 Other pulmonary embolism without acute cor pulmonale: Secondary | ICD-10-CM

## 2013-08-04 DIAGNOSIS — Z7901 Long term (current) use of anticoagulants: Secondary | ICD-10-CM

## 2013-08-04 DIAGNOSIS — R760 Raised antibody titer: Secondary | ICD-10-CM

## 2013-08-04 LAB — POCT INR: INR: 1.8

## 2013-08-04 NOTE — Progress Notes (Signed)
Pre visit review using our clinic review tool, if applicable. No additional management support is needed unless otherwise documented below in the visit note. 

## 2013-09-01 ENCOUNTER — Ambulatory Visit (INDEPENDENT_AMBULATORY_CARE_PROVIDER_SITE_OTHER): Payer: BC Managed Care – PPO | Admitting: General Practice

## 2013-09-01 DIAGNOSIS — I2699 Other pulmonary embolism without acute cor pulmonale: Secondary | ICD-10-CM

## 2013-09-01 DIAGNOSIS — R894 Abnormal immunological findings in specimens from other organs, systems and tissues: Secondary | ICD-10-CM

## 2013-09-01 DIAGNOSIS — Z5181 Encounter for therapeutic drug level monitoring: Secondary | ICD-10-CM

## 2013-09-01 DIAGNOSIS — R760 Raised antibody titer: Secondary | ICD-10-CM

## 2013-09-01 DIAGNOSIS — Z7901 Long term (current) use of anticoagulants: Secondary | ICD-10-CM

## 2013-09-01 LAB — POCT INR: INR: 1.8

## 2013-09-01 NOTE — Progress Notes (Signed)
Pre visit review using our clinic review tool, if applicable. No additional management support is needed unless otherwise documented below in the visit note. 

## 2013-09-29 ENCOUNTER — Ambulatory Visit: Payer: BC Managed Care – PPO

## 2013-10-03 ENCOUNTER — Ambulatory Visit (INDEPENDENT_AMBULATORY_CARE_PROVIDER_SITE_OTHER): Payer: BC Managed Care – PPO | Admitting: General Practice

## 2013-10-03 DIAGNOSIS — R760 Raised antibody titer: Secondary | ICD-10-CM

## 2013-10-03 DIAGNOSIS — Z5181 Encounter for therapeutic drug level monitoring: Secondary | ICD-10-CM

## 2013-10-03 DIAGNOSIS — I2699 Other pulmonary embolism without acute cor pulmonale: Secondary | ICD-10-CM

## 2013-10-03 DIAGNOSIS — R894 Abnormal immunological findings in specimens from other organs, systems and tissues: Secondary | ICD-10-CM

## 2013-10-03 DIAGNOSIS — Z7901 Long term (current) use of anticoagulants: Secondary | ICD-10-CM

## 2013-10-03 LAB — POCT INR: INR: 2

## 2013-10-03 NOTE — Progress Notes (Signed)
Pre visit review using our clinic review tool, if applicable. No additional management support is needed unless otherwise documented below in the visit note. 

## 2013-10-31 ENCOUNTER — Ambulatory Visit: Payer: BC Managed Care – PPO

## 2013-10-31 ENCOUNTER — Ambulatory Visit (INDEPENDENT_AMBULATORY_CARE_PROVIDER_SITE_OTHER): Payer: BC Managed Care – PPO | Admitting: General Practice

## 2013-10-31 DIAGNOSIS — R760 Raised antibody titer: Secondary | ICD-10-CM

## 2013-10-31 DIAGNOSIS — Z5181 Encounter for therapeutic drug level monitoring: Secondary | ICD-10-CM

## 2013-10-31 DIAGNOSIS — R894 Abnormal immunological findings in specimens from other organs, systems and tissues: Secondary | ICD-10-CM

## 2013-10-31 DIAGNOSIS — I2699 Other pulmonary embolism without acute cor pulmonale: Secondary | ICD-10-CM

## 2013-10-31 DIAGNOSIS — Z7901 Long term (current) use of anticoagulants: Secondary | ICD-10-CM

## 2013-10-31 LAB — POCT INR: INR: 1.9

## 2013-10-31 NOTE — Progress Notes (Signed)
Pre visit review using our clinic review tool, if applicable. No additional management support is needed unless otherwise documented below in the visit note. 

## 2013-11-14 ENCOUNTER — Other Ambulatory Visit: Payer: Self-pay | Admitting: Internal Medicine

## 2013-11-15 NOTE — Telephone Encounter (Signed)
Sent to the pharmacy.  Has an upcoming appt with WP.

## 2013-11-28 ENCOUNTER — Telehealth: Payer: Self-pay | Admitting: Internal Medicine

## 2013-11-28 ENCOUNTER — Other Ambulatory Visit: Payer: BC Managed Care – PPO

## 2013-11-28 ENCOUNTER — Ambulatory Visit: Payer: BC Managed Care – PPO

## 2013-11-28 DIAGNOSIS — R635 Abnormal weight gain: Secondary | ICD-10-CM

## 2013-11-28 DIAGNOSIS — R5383 Other fatigue: Secondary | ICD-10-CM

## 2013-11-28 DIAGNOSIS — R8290 Unspecified abnormal findings in urine: Secondary | ICD-10-CM

## 2013-11-28 DIAGNOSIS — R5381 Other malaise: Secondary | ICD-10-CM

## 2013-11-28 DIAGNOSIS — R631 Polydipsia: Secondary | ICD-10-CM

## 2013-11-28 NOTE — Telephone Encounter (Signed)
Left a message on home phone for the pt to return my call. 

## 2013-11-28 NOTE — Telephone Encounter (Signed)
Pt would like TSH,glucose, and her Urine check for elevated WBC?? Can you add these orders??

## 2013-11-28 NOTE — Telephone Encounter (Signed)
ok to do labs but need dx code  Please call  Patient and clarify and get correct inforamtion

## 2013-12-01 ENCOUNTER — Other Ambulatory Visit (INDEPENDENT_AMBULATORY_CARE_PROVIDER_SITE_OTHER): Payer: BC Managed Care – PPO

## 2013-12-01 ENCOUNTER — Ambulatory Visit (INDEPENDENT_AMBULATORY_CARE_PROVIDER_SITE_OTHER): Payer: BC Managed Care – PPO | Admitting: General Practice

## 2013-12-01 DIAGNOSIS — E611 Iron deficiency: Secondary | ICD-10-CM

## 2013-12-01 DIAGNOSIS — R82998 Other abnormal findings in urine: Secondary | ICD-10-CM

## 2013-12-01 DIAGNOSIS — R8290 Unspecified abnormal findings in urine: Secondary | ICD-10-CM

## 2013-12-01 DIAGNOSIS — E538 Deficiency of other specified B group vitamins: Secondary | ICD-10-CM

## 2013-12-01 DIAGNOSIS — R760 Raised antibody titer: Secondary | ICD-10-CM

## 2013-12-01 DIAGNOSIS — D509 Iron deficiency anemia, unspecified: Secondary | ICD-10-CM

## 2013-12-01 DIAGNOSIS — E559 Vitamin D deficiency, unspecified: Secondary | ICD-10-CM

## 2013-12-01 DIAGNOSIS — R894 Abnormal immunological findings in specimens from other organs, systems and tissues: Secondary | ICD-10-CM

## 2013-12-01 DIAGNOSIS — Z5181 Encounter for therapeutic drug level monitoring: Secondary | ICD-10-CM

## 2013-12-01 DIAGNOSIS — I2699 Other pulmonary embolism without acute cor pulmonale: Secondary | ICD-10-CM

## 2013-12-01 DIAGNOSIS — Z7901 Long term (current) use of anticoagulants: Secondary | ICD-10-CM

## 2013-12-01 DIAGNOSIS — Z79899 Other long term (current) drug therapy: Secondary | ICD-10-CM

## 2013-12-01 LAB — POCT URINALYSIS DIPSTICK
Bilirubin, UA: NEGATIVE
Glucose, UA: NEGATIVE
Ketones, UA: NEGATIVE
NITRITE UA: NEGATIVE
PH UA: 6.5
Protein, UA: NEGATIVE
RBC UA: NEGATIVE
Spec Grav, UA: 1.02
Urobilinogen, UA: 0.2

## 2013-12-01 LAB — FERRITIN: Ferritin: 90.2 ng/mL (ref 10.0–291.0)

## 2013-12-01 LAB — BASIC METABOLIC PANEL
BUN: 8 mg/dL (ref 6–23)
CO2: 31 mEq/L (ref 19–32)
CREATININE: 0.7 mg/dL (ref 0.4–1.2)
Calcium: 8.8 mg/dL (ref 8.4–10.5)
Chloride: 102 mEq/L (ref 96–112)
GFR: 93.74 mL/min (ref >60.00)
Glucose, Bld: 97 mg/dL (ref 70–99)
Potassium: 4.3 mEq/L (ref 3.5–5.1)
Sodium: 136 mEq/L (ref 135–145)

## 2013-12-01 LAB — IBC PANEL
IRON: 56 ug/dL (ref 42–145)
Saturation Ratios: 16.4 % — ABNORMAL LOW (ref 20.0–50.0)
TRANSFERRIN: 243.6 mg/dL (ref 212.0–360.0)

## 2013-12-01 LAB — C-REACTIVE PROTEIN: CRP: 0.7 mg/dL (ref 0.5–20.0)

## 2013-12-01 LAB — VITAMIN B12: Vitamin B-12: 554 pg/mL (ref 211–911)

## 2013-12-01 LAB — POCT INR: INR: 1.7

## 2013-12-01 LAB — MAGNESIUM: Magnesium: 1.9 mg/dL (ref 1.5–2.5)

## 2013-12-01 LAB — TSH: TSH: 1.9 u[IU]/mL (ref 0.35–4.50)

## 2013-12-01 NOTE — Telephone Encounter (Signed)
Patient stopped by the office.  Ordered TSH for weight gain, fatigue and thirst.  Had an abnormal urine at Dr. Anette Guarneri office and has had one here.  Abnormal used.  Unable to find a suitable code for BMP.  She will wait and have that checked at her next annual exam.

## 2013-12-01 NOTE — Progress Notes (Signed)
Pre visit review using our clinic review tool, if applicable. No additional management support is needed unless otherwise documented below in the visit note. 

## 2013-12-02 LAB — VITAMIN D 25 HYDROXY (VIT D DEFICIENCY, FRACTURES): VIT D 25 HYDROXY: 41 ng/mL (ref 30–89)

## 2013-12-05 ENCOUNTER — Ambulatory Visit: Payer: BC Managed Care – PPO | Admitting: Internal Medicine

## 2013-12-07 ENCOUNTER — Encounter: Payer: Self-pay | Admitting: Internal Medicine

## 2013-12-07 ENCOUNTER — Ambulatory Visit (INDEPENDENT_AMBULATORY_CARE_PROVIDER_SITE_OTHER): Payer: BC Managed Care – PPO | Admitting: Internal Medicine

## 2013-12-07 VITALS — BP 102/76 | HR 84 | Temp 98.0°F | Wt 163.0 lb

## 2013-12-07 DIAGNOSIS — D509 Iron deficiency anemia, unspecified: Secondary | ICD-10-CM

## 2013-12-07 DIAGNOSIS — L738 Other specified follicular disorders: Secondary | ICD-10-CM

## 2013-12-07 DIAGNOSIS — E538 Deficiency of other specified B group vitamins: Secondary | ICD-10-CM

## 2013-12-07 DIAGNOSIS — F4322 Adjustment disorder with anxiety: Secondary | ICD-10-CM

## 2013-12-07 DIAGNOSIS — Z87440 Personal history of urinary (tract) infections: Secondary | ICD-10-CM

## 2013-12-07 DIAGNOSIS — E611 Iron deficiency: Secondary | ICD-10-CM

## 2013-12-07 DIAGNOSIS — M069 Rheumatoid arthritis, unspecified: Secondary | ICD-10-CM

## 2013-12-07 DIAGNOSIS — Z7901 Long term (current) use of anticoagulants: Secondary | ICD-10-CM

## 2013-12-07 DIAGNOSIS — L853 Xerosis cutis: Secondary | ICD-10-CM | POA: Insufficient documentation

## 2013-12-07 DIAGNOSIS — M810 Age-related osteoporosis without current pathological fracture: Secondary | ICD-10-CM

## 2013-12-07 MED ORDER — MULTIGEN 70 MG PO TABS: ORAL_TABLET | ORAL | Status: DC

## 2013-12-07 MED ORDER — XANAX 0.25 MG PO TABS: ORAL_TABLET | ORAL | Status: DC

## 2013-12-07 NOTE — Progress Notes (Signed)
Pre visit review using our clinic review tool, if applicable. No additional management support is needed unless otherwise documented below in the visit note. 

## 2013-12-07 NOTE — Progress Notes (Signed)
Chief Complaint  Patient presents with  . Follow-up    labs many issues    HPI: [name]  comes in today for follow up of  multiple medical problems.   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  Currently her arthritis is stable on pred 1.5 mg long time.  time asks if she should get the shingles vaccine. She's no longer on methotrexate  Sleep anxiety Tried  To decrease xanax  Inc hr  And did   half  And still had heart racing in the morning  In am   Hard to deeper sleep when she is not taking the medication she is interested in weaning the medication  Minor  Cough at night . But no dysphasia or heartburn otherwise  Pilocarpine. Prescription 5 3 times a day given by Dr. Keturah Barre. and K. she wants to try it for her dryness including vaginal Hasn't really tried it yet asks me my opinion  Fosamax for 1 year and to get   dexa   And such takes it upright.  No active bleeding needs a refill on her iron and vitamins may have been out for a little bit.  No new clotting or bleeding.  Like a refill of the 40% urea for hydration only had a small 2 called in. ROS: See pertinent positives and negatives per HPI. No current new chest pain shortness of breath or other things except for the anxiety that she is aware of. She doesn't have UTI symptoms at this point but had an abnormal UA at Dr. Albertina Parr office with 2+ leukocytes. She has a history of bladder dysfunction and UTIs.  Past Medical History  Diagnosis Date  . Chest pain, unspecified   . Palpitations   . Personal history of venous thrombosis and embolism   . Chronic anticoagulation   . Adjustment disorder with anxiety   . Other and unspecified  hyperlipidemia   . B12 deficiency due to diet     is a vegetarian  . Unspecified vitamin D deficiency   . Otalgia, unspecified   . Iron deficiency anemia, unspecified   . Rheumatoid arthritis(714.0)     deveshewar  . Headache(784.0)     migraines and head pain  . Hx of varicella     As Child  . UTI (lower urinary tract infection) 11/28/2011    citrobacter       Family History  Problem Relation Age of Onset  . Dementia Father   . Stroke Father   . Hyperlipidemia Father   . Diabetes Father   . Parkinsonism Mother   . Lymphoma      History   Social History  . Marital Status: Married    Spouse Name: N/A    Number of Children: N/A  . Years of Education: N/A   Social History Main Topics  . Smoking status: Never Smoker   . Smokeless tobacco: None  . Alcohol Use: No  . Drug Use: No  . Sexual Activity: None   Other Topics Concern  . None   Social History Narrative   Regular Exercise-no   20 yrs in [place]   Son at [employer]   From [place].   Married [place]  of 3    G1P1  Outpatient Encounter Prescriptions as of 12/07/2013  Medication Sig  . alendronate (FOSAMAX) 70 MG tablet   . Calcium Carb-Cholecalciferol (CALTRATE 600+D) 600-800 MG-UNIT TABS Take by mouth.  Marland Kitchen CARMOL 40 40 % CREA APPLY  CREAM TOPICALLY EVERY DAY  . Cholecalciferol (VITAMIN D-3) 1000 UNITS CAPS Take by mouth.  . COUMADIN 2.5 MG tablet Take as directed by coumadin clinic.  . cycloSPORINE (RESTASIS) 0.05 % ophthalmic emulsion Place 1 drop into both eyes daily.    . folic acid (FOLVITE) 1 MG tablet Take 1 mg by mouth 2 (two) times daily.    . hydroxychloroquine (PLAQUENIL) 200 MG tablet Take 200 mg by mouth 2 (two) times daily.    Marland Kitchen loteprednol (LOTEMAX) 0.2 % SUSP 1 drop 4 (four) times daily.  . mineral/vitamin supplement (MULTIGEN) 70 MG TABS tablet TAKE ONE TABLET BY MOUTH DAILY.  Marland Kitchen NASCOBAL 500 MCG/0.1ML SOLN USE 50 MCG AS DIRECTED INTO THE NOSE  . NON FORMULARY   . predniSONE (DELTASONE) 1  MG tablet daily. 1.5mg  daily  . SUMAtriptan (IMITREX) 50 MG tablet TAKE ONE TABLET BY MOUTH AS NEEDED-MAY REPEAT IN 2 TO 4 HOURS IF NEEDED  . XANAX 0.25 MG tablet TAKE ONE TABLET BY MOUTH AT BEDTIME AS NEEDED FOR SLEEP ,anxiety  . [DISCONTINUED] mineral/vitamin supplement (MULTIGEN) 70 MG TABS tablet TAKE ONE TABLET BY MOUTH DAILY.  . [DISCONTINUED] XANAX 0.25 MG tablet TAKE ONE TABLET BY MOUTH AT BEDTIME AS NEEDED FOR SLEEP ,anxiety    EXAM:  BP 102/76  Pulse 84  Temp(Src) 98 F (36.7 C) (Oral)  Wt 163 lb (73.936 kg)  SpO2 97%  Body mass index is 29.1 kg/(m^2).  GENERAL: vitals reviewed and listed above, alert, oriented, appears well hydrated and in no acute distress HEENT: atraumatic, conjunctiva  clear, no obvious abnormalities on inspection of external nose and ears NECK: no obvious masses on inspection palpation  LUNGS: clear to auscultation bilaterally, no wheezes, rales or rhonchi, good air movement CV: HRRR, no clubbing cyanosis or  peripheral edema nl cap refill  MS: moves all extremities without noticeable focal  abnormality PSYCH: pleasant and cooperative, no obvious depression anxiety is less than usual Skin shows dry heels Labs reviewed  ASSESSMENT AND PLAN:  Discussed the following assessment and plan:  Adjustment disorder with anxiety - disc med taking at nigh low amount ok try to wean as possible  consdier other meds visit  Iron deficiency - ibc lower get back on iron vitamin   B12 deficiency - stable levels  Chronic anticoagulation  Rheumatoid arthritis(714.0)  Osteoporosis  Xerosis of skin - esp heels   History of recurrent UTI (urinary tract infection)  -Patient advised to return or notify health care team  if symptoms worsen ,persist or new concerns arise.  Patient Instructions  Ok to wean the xanax  To 1/2 every nght and then every other night at time   If you have  uti sx   Then can treat for infection otherwise  Wouldn't treat  With  antibiotic.  And considier culture test.    Will check on shingles vaccine    Advisability based on your current meds.       Standley Brooking. Panosh M.D. Total visit 53mins > 50% spent counseling and coordinating care

## 2013-12-07 NOTE — Patient Instructions (Addendum)
Ok to wean the xanax  To 1/2 every nght and then every other night at time   If you have  uti sx   Then can treat for infection otherwise  Wouldn't treat  With antibiotic.  And considier culture test.    Will check on shingles vaccine    Advisability based on your current meds.

## 2013-12-16 ENCOUNTER — Ambulatory Visit (INDEPENDENT_AMBULATORY_CARE_PROVIDER_SITE_OTHER): Payer: BC Managed Care – PPO | Admitting: Internal Medicine

## 2013-12-16 ENCOUNTER — Telehealth: Payer: Self-pay | Admitting: Cardiology

## 2013-12-16 ENCOUNTER — Ambulatory Visit (INDEPENDENT_AMBULATORY_CARE_PROVIDER_SITE_OTHER): Payer: BC Managed Care – PPO | Admitting: Pharmacist

## 2013-12-16 VITALS — BP 106/62 | HR 65 | Ht 64.0 in | Wt 157.1 lb

## 2013-12-16 DIAGNOSIS — M79609 Pain in unspecified limb: Secondary | ICD-10-CM

## 2013-12-16 DIAGNOSIS — Z5181 Encounter for therapeutic drug level monitoring: Secondary | ICD-10-CM

## 2013-12-16 DIAGNOSIS — R760 Raised antibody titer: Secondary | ICD-10-CM

## 2013-12-16 DIAGNOSIS — I2699 Other pulmonary embolism without acute cor pulmonale: Secondary | ICD-10-CM

## 2013-12-16 DIAGNOSIS — M79602 Pain in left arm: Secondary | ICD-10-CM

## 2013-12-16 DIAGNOSIS — R894 Abnormal immunological findings in specimens from other organs, systems and tissues: Secondary | ICD-10-CM

## 2013-12-16 LAB — POCT INR: INR: 1.6

## 2013-12-16 NOTE — Telephone Encounter (Signed)
New problem    Pt is having left arm pain that is hurting really bad and want to come in office today to be checked out. Please call pt.

## 2013-12-16 NOTE — Progress Notes (Signed)
Patient Care Team: Burnis Medin, MD as PCP - General Christoper Allegra, MD (Rheumatology) Mayme Genta, MD (Gastroenterology) Renella Cunas, MD (Cardiology) Ascencion Dike, MD (Otolaryngology) Larey Seat, MD (Neurology) Naoma Diener, MD (Obstetrics and Gynecology) Selinda Orion, MD (Dermatology) Ailene Rud, MD as Attending Physician (Urology)   HPI  [name] is a 53 y.o. 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 She has had 3 days of problems with pain in her left arm. She notes that aggravated by motion of the arm. It seemed to get better pain. It lasted hours and hours at a time. It is most aggravated in addition to movement when she awakens at night.  She notes also that she has had discomfort radiating to the left occipital scalp as well as down into her left hand.  She has no known cardiac disease.  She saw her rheumatologist and she has prednisone dependent RA. It was her impression that was not are clear. Not withstanding, the patient awakened mild increased doses 1.5--2.5 mg of prednisone.   Past Medical History  Diagnosis Date  . Chest pain, unspecified   . Palpitations   . Personal history of venous thrombosis and embolism   . Chronic anticoagulation   . Adjustment disorder with anxiety   . Other and unspecified hyperlipidemia   . B12 deficiency due to diet     is a vegetarian  . Unspecified vitamin D deficiency   . Otalgia, unspecified   . Iron deficiency anemia, unspecified   . Rheumatoid arthritis(714.0)     deveshewar  . Headache(784.0)     migraines and head pain  . Hx of varicella     As Child  . UTI (lower urinary tract infection) 11/28/2011    citrobacter       Past Surgical History  Procedure Laterality Date  . Tympanostomy tube placement  2009    Right    Current Outpatient Prescriptions  Medication Sig Dispense Refill  .  alendronate (FOSAMAX) 70 MG tablet       . Calcium Carb-Cholecalciferol (CALTRATE 600+D) 600-800 MG-UNIT TABS Take by mouth.      Marland Kitchen CARMOL 40 40 % CREA APPLY  CREAM TOPICALLY EVERY DAY  29 each  1  . Cholecalciferol (VITAMIN D-3) 1000 UNITS CAPS Take by mouth.      . COUMADIN 2.5 MG tablet Take as directed by coumadin clinic.  150 tablet  1  . cycloSPORINE (RESTASIS) 0.05 % ophthalmic emulsion Place 1 drop into both eyes daily.        . folic acid (FOLVITE) 1 MG tablet Take 1 mg by mouth 2 (two) times daily.        . hydroxychloroquine (PLAQUENIL) 200 MG tablet Take 200 mg by mouth 2 (two) times daily.        Marland Kitchen loteprednol (LOTEMAX) 0.2 % SUSP 1 drop 4 (four) times daily.      . mineral/vitamin supplement (MULTIGEN) 70 MG TABS tablet TAKE ONE TABLET BY MOUTH DAILY.  30 each  5  . NASCOBAL 500 MCG/0.1ML SOLN USE 50 MCG AS DIRECTED INTO THE NOSE  2 Bottle  2  . NON FORMULARY       . predniSONE (DELTASONE) 1 MG tablet daily. 1.5mg  daily      . SUMAtriptan (IMITREX) 50 MG tablet TAKE ONE TABLET BY MOUTH AS  NEEDED-MAY REPEAT IN 2 TO 4 HOURS IF NEEDED  9 tablet  0  . XANAX 0.25 MG tablet TAKE ONE TABLET BY MOUTH AT BEDTIME AS NEEDED FOR SLEEP ,anxiety  60 tablet  3   No current facility-administered medications for this visit.    Allergies  Allergen Reactions  . Boniva [Ibandronate Sodium]     Heartburn  . Ivp Dye [Iodinated Diagnostic Agents] Swelling    Mouth swelling.  . Risedronate Sodium [Risedronate Sodium] Other (See Comments)    Foot and bone pain and fatigue    Review of Systems negative except from HPI and PMH  Physical Exam BP 106/62  Pulse 65  Ht 5\' 4"  (1.626 m)  Wt 157 lb 1.9 oz (71.269 kg)  BMI 26.96 kg/m2 Well developed and well nourished in no acute distress HENT normal E scleral and icterus clear Neck Supple JVP flat; carotids brisk and full Clear to ausculation  Regular rate and rhythm, no murmurs gallops or rub Soft with active bowel sounds No clubbing  cyanosis  Edema Alert and oriented, grossly normal motor and sensory function There is pain with active greater than passive motion of her left arm in terms of abduction There is a small quarter tlike ecchymosis on the anterior portion of the muscle belly Significantly restricted internal rotation of the left arm Skin Warm and Dry  ECG demonstrates sinus rhythm at 65 Interval 17/10/42  Assessment and  Plan  Left arm pain  Internal rotation limitation   I am not sure what the cause of her pain is. I do not think it is cardiac. Possibilities include adhesive capsulitis, cervical pain with radiation to both in her scalp that her hand, or some kind of trauma to the muscle belly as suggested by the small ecchymosis amongst others  We will check her INR and will have her followup with her PCP

## 2013-12-16 NOTE — Telephone Encounter (Signed)
Pt states she has been having left arm pain since Wednesday. She has rheumatoid arthritis but states her rheumatologist told her yesterday that it was not a flare up of the the arthritis & she should have it checked out with her cardiologist. Pt has not been seen by Dr. Verl Blalock  Since 07/2011.  Denies any shortness of breath, denies any angina, denies any edema or weight gain. States occasionally with this left arm pain she has some neck & head pain.  She appears quite anxious over the telephone insisting to be seen & not go to the ED unless absolutely necessary b/c of money. Offered to do EKG & have tried to reassure her.  She will come in for ekg & fully expects to be seen today. I have told her we will start with her ekg & have the dod to review & go from there. Pt agrees.   Horton Chin RN

## 2013-12-29 ENCOUNTER — Ambulatory Visit: Payer: BC Managed Care – PPO

## 2013-12-29 ENCOUNTER — Ambulatory Visit (INDEPENDENT_AMBULATORY_CARE_PROVIDER_SITE_OTHER): Payer: BC Managed Care – PPO | Admitting: Family

## 2013-12-29 DIAGNOSIS — Z7901 Long term (current) use of anticoagulants: Secondary | ICD-10-CM

## 2013-12-29 DIAGNOSIS — I2699 Other pulmonary embolism without acute cor pulmonale: Secondary | ICD-10-CM

## 2013-12-29 DIAGNOSIS — Z5181 Encounter for therapeutic drug level monitoring: Secondary | ICD-10-CM

## 2013-12-29 DIAGNOSIS — R894 Abnormal immunological findings in specimens from other organs, systems and tissues: Secondary | ICD-10-CM

## 2013-12-29 DIAGNOSIS — R760 Raised antibody titer: Secondary | ICD-10-CM

## 2013-12-29 LAB — POCT INR: INR: 1.6

## 2013-12-29 MED ORDER — COUMADIN 2.5 MG PO TABS: ORAL_TABLET | ORAL | Status: DC

## 2013-12-29 NOTE — Patient Instructions (Signed)
Continue taking 2 tablets everyday except 1 tablet on Sunday and Thursday.  Recheck INR in 4 weeks with [facility] as scheduled.  Anticoagulation Dose Instructions as of 12/29/2013     Dorene Grebe Tue Wed Thu Fri Sat   New Dose 2.5 mg 5 mg 5 mg 5 mg 2.5 mg 5 mg 5 mg    Description       Continue taking 2 tablets everyday except 1 tablet on Sunday and Thursday.  Recheck INR in 4 weeks with [facility] as scheduled.

## 2014-01-03 ENCOUNTER — Encounter: Payer: Self-pay | Admitting: Internal Medicine

## 2014-01-03 ENCOUNTER — Ambulatory Visit (INDEPENDENT_AMBULATORY_CARE_PROVIDER_SITE_OTHER): Payer: BC Managed Care – PPO | Admitting: Internal Medicine

## 2014-01-03 VITALS — BP 104/68 | Temp 97.9°F | Ht 64.0 in | Wt 160.0 lb

## 2014-01-03 DIAGNOSIS — F4322 Adjustment disorder with anxiety: Secondary | ICD-10-CM

## 2014-01-03 DIAGNOSIS — M79609 Pain in unspecified limb: Secondary | ICD-10-CM

## 2014-01-03 DIAGNOSIS — M79622 Pain in left upper arm: Secondary | ICD-10-CM | POA: Insufficient documentation

## 2014-01-03 DIAGNOSIS — M25519 Pain in unspecified shoulder: Secondary | ICD-10-CM

## 2014-01-03 DIAGNOSIS — Z7901 Long term (current) use of anticoagulants: Secondary | ICD-10-CM

## 2014-01-03 DIAGNOSIS — M069 Rheumatoid arthritis, unspecified: Secondary | ICD-10-CM

## 2014-01-03 DIAGNOSIS — M25512 Pain in left shoulder: Secondary | ICD-10-CM

## 2014-01-03 MED ORDER — TRAMADOL HCL 50 MG PO TABS: 50.0000 mg | ORAL_TABLET | Freq: Three times a day (TID) | ORAL | Status: DC | PRN

## 2014-01-03 NOTE — Progress Notes (Signed)
Pre visit review using our clinic review tool, if applicable. No additional management support is needed unless otherwise documented below in the visit note.  Chief Complaint  Patient presents with  . Arm Pain    Both right and left arm.  Left is worse than the right.  Started 3 wks ago.  Comes and goes. Also has skull pain that moves around.  . Skull Pain    HPI: Patient comes in today for SDA for  new problem evaluation.  Problem with left arm pain that began a few weeks ago and was quite severe mostly at night without associated trauma upper arm somewhat into the neck. Was seen by Dr. Darnell Level. with a brief encounter and no appointment he didn't think that was RA. She saw cardiology EKG negative was felt to not be vascular consider shoulder or musculoskeletal problem. Onset with severe pain  And at night and got scared and had many concerns about what it could be  Tried  Up to 5 mg  Prednisone.  flet better and then back down. 4.5 mg got slightly better using mostly heat. Is very worried something serious. Can't take anti-inflammatories hasn't taken Tylenol had some mild grogginess with Ultram in the past.  She is left-handed.  ROS: See pertinent positives and negatives per HPI. No unusual new onset bleeding anxiety is worse with this situation no new chest pain shortness of breath some tightness in the right thorax area bottom of foot hurts and skull pain of neck. No new neurologic symptoms. Right shoulder is beginning to hurt and upper arm  Past Medical History  Diagnosis Date  . Chest pain, unspecified   . Palpitations   . Personal history of venous thrombosis and embolism   . Chronic anticoagulation   . Adjustment disorder with anxiety   . Other and unspecified hyperlipidemia   . B12 deficiency due to diet     is a vegetarian  . Unspecified vitamin D deficiency   . Otalgia, unspecified   . Iron deficiency anemia, unspecified   . Rheumatoid arthritis(714.0)     [name]  .  Headache(784.0)     migraines and head pain  . Hx of varicella     As Child  . UTI (lower urinary tract infection) 11/28/2011    citrobacter       Family History  Problem Relation Age of Onset  . Dementia Father   . Stroke Father   . Hyperlipidemia Father   . Diabetes Father   . Parkinsonism Mother   . Lymphoma      History   Social History  . Marital Status: Married    Spouse Name: N/A    Number of Children: N/A  . Years of Education: N/A   Social History Main Topics  . Smoking status: Never Smoker   . Smokeless tobacco: Not on file  . Alcohol Use: No  . Drug Use: No  . Sexual Activity: Not on file   Other Topics Concern  . Not on file   Social History Narrative   Regular Exercise-no   20 yrs in [place]   Son at [employer]   From [place].   Married [facility]  of 3    G1P1             Outpatient Encounter Prescriptions as of 01/03/2014  Medication Sig  . alendronate (FOSAMAX) 70 MG tablet   . Calcium Carb-Cholecalciferol (CALTRATE 600+D) 600-800 MG-UNIT TABS Take by mouth.  Marland Kitchen CARMOL 40 40 % CREA  APPLY  CREAM TOPICALLY EVERY DAY  . Cholecalciferol (VITAMIN D-3) 1000 UNITS CAPS Take by mouth.  . COUMADIN 2.5 MG tablet Take as directed by coumadin clinic.  . cycloSPORINE (RESTASIS) 0.05 % ophthalmic emulsion Place 1 drop into both eyes daily.    . folic acid (FOLVITE) 1 MG tablet Take 1 mg by mouth 2 (two) times daily.    . hydroxychloroquine (PLAQUENIL) 200 MG tablet Take 200 mg by mouth 2 (two) times daily.    Marland Kitchen loteprednol (LOTEMAX) 0.2 % SUSP 1 drop 4 (four) times daily.  . mineral/vitamin supplement (MULTIGEN) 70 MG TABS tablet TAKE ONE TABLET BY MOUTH DAILY.  Marland Kitchen NASCOBAL 500 MCG/0.1ML SOLN USE 50 MCG AS DIRECTED INTO THE NOSE  . NON FORMULARY   . predniSONE (DELTASONE) 1 MG tablet daily. 1.5mg  daily  . SUMAtriptan (IMITREX) 50 MG tablet TAKE ONE TABLET BY MOUTH AS NEEDED-MAY REPEAT IN 2 TO 4 HOURS IF NEEDED  . XANAX 0.25 MG tablet TAKE ONE TABLET BY MOUTH AT BEDTIME AS  NEEDED FOR SLEEP ,anxiety  . traMADol (ULTRAM) 50 MG tablet Take 1 tablet (50 mg total) by mouth every 8 (eight) hours as needed.    EXAM:  BP 104/68  Temp(Src) 97.9 F (36.6 C) (Oral)  Ht 5\' 4"  (1.626 m)  Wt 160 lb (72.576 kg)  BMI 27.45 kg/m2  Body mass index is 27.45 kg/(m^2).  GENERAL: vitals reviewed and listed above, alert, oriented, appears well hydrated with some moderate anxiety about situation tends to hold her left upper arm shoulder area HEENT: atraumatic, conjunctiva  clear, no obvious abnormalities on inspection of external nose and ears NECK: no obvious masses on inspection palpation no bruits are heard LUNGS: clear to auscultation bilaterally, no wheezes, rales or rhonchi CV: HRRR, no clubbing cyanosis  nl cap refill  MS: moves all extremities   left shoulder has discomfort with range of motion elevation internal rotation and cross body. No obvious stiffness point tenderness up at the bony prominence. Mostly over the deltoid biceps area. No acute joint swelling seen right shoulder has better range of motion pulses intact. Skin no acute rashes. Over area of concern. PSYCH: pleasant and cooperative, moderate anxiety about situation anxiety  ASSESSMENT AND PLAN:  Discussed the following assessment and plan:  Pain of left upper arm - Probably shoulder related based on exam has underlying disease but seem like prob rcimpringement or capsulitis base on onset and pain . - Plan: Ambulatory referral to Orthopedic Surgery  Shoulder pain, left - Plan: Ambulatory referral to Orthopedic Surgery  Adjustment disorder with anxiety - aggravated  by current situation  Rheumatoid arthritis(714.0) - Plan: Ambulatory referral to Orthopedic Surgery  Chronic anticoagulation I think her pain is related to her shoulder but could the other local musculoskeletal causes we'll need imaging and advised orthopedic referral to evaluate. Plan follow up after that. She asked about whether she  should have a neurology referral I would wait on that as I think it is musculoskeletal although I suppose could be radiating from the neck. She can't take most anti-inflammatories because of her anticoagulation and underlying issues. She is on low-dose prednisone for RA -Patient advised to return or notify health care team  if symptoms worsen ,persist or new concerns arise.  Patient Instructions  We are going to refer  You to evaluate  Your shoulder  . Area .  To orthopedics  You should have an x ray  Of this area  Also.   Ok to see  Dr D in the interim.  This could be   Shoulder rotator cuff tendinitis and bursitis  Or adhesive capsulitis  And not necessarily active rheumatoid.  I also done think this is a vascular or cardiac problem  Can use tylenol or   Tramadol if needed and we will get you an orthopedic consult.      Standley Brooking. Panosh M.D.

## 2014-01-03 NOTE — Patient Instructions (Signed)
We are going to refer  You to evaluate  Your shoulder  . Area .  To orthopedics  You should have an x ray  Of this area  Also.   Ok to see  Dr D in the interim.  This could be   Shoulder rotator cuff tendinitis and bursitis  Or adhesive capsulitis  And not necessarily active rheumatoid.  I also done think this is a vascular or cardiac problem  Can use tylenol or   Tramadol if needed and we will get you an orthopedic consult.

## 2014-01-09 ENCOUNTER — Ambulatory Visit (INDEPENDENT_AMBULATORY_CARE_PROVIDER_SITE_OTHER): Payer: BC Managed Care – PPO | Admitting: Internal Medicine

## 2014-01-09 ENCOUNTER — Encounter: Payer: Self-pay | Admitting: Internal Medicine

## 2014-01-09 VITALS — BP 110/68 | Temp 98.1°F | Ht 64.0 in | Wt 157.0 lb

## 2014-01-09 DIAGNOSIS — Z7952 Long term (current) use of systemic steroids: Secondary | ICD-10-CM | POA: Insufficient documentation

## 2014-01-09 DIAGNOSIS — Z7901 Long term (current) use of anticoagulants: Secondary | ICD-10-CM

## 2014-01-09 DIAGNOSIS — J029 Acute pharyngitis, unspecified: Secondary | ICD-10-CM

## 2014-01-09 DIAGNOSIS — IMO0002 Reserved for concepts with insufficient information to code with codable children: Secondary | ICD-10-CM

## 2014-01-09 DIAGNOSIS — M069 Rheumatoid arthritis, unspecified: Secondary | ICD-10-CM

## 2014-01-09 LAB — POCT RAPID STREP A (OFFICE): Rapid Strep A Screen: NEGATIVE

## 2014-01-09 NOTE — Patient Instructions (Addendum)
Exam is reassuring ear is good.  Check lab for strep  Today incase but i doubt.   that this is .  Make appt for  Thursday  Am  And we can always cancel the appt.  This could be a viral    Cause of other . dont think antibiotic is indicated at this time.

## 2014-01-09 NOTE — Progress Notes (Signed)
Pre visit review using our clinic review tool, if applicable. No additional management support is needed unless otherwise documented below in the visit note.  Chief Complaint  Patient presents with  . Sore Throat    Started 01/08/14.  Only on the rt side and in her rt ear.  Gargled with salt water and did not help.  Declined flu vaccine today.    HPI: Patient comes in today for SDA for  Acute new problem evaluation. Seen last week for ongoing arm pain  With may be shouldder or MS related .  Was doin ok over the weekend and then  Onset once day of theses sx. riogh tisde  Hurst to swallow .  And hurts uyp to ear. ROS: See pertinent positives and negatives per HPI. Dental work cleaning 4 days ago  No teeth pain asks if she is contagious and if  Antibiotic is indicated  No nvd  New rash still onpred low dose   Feels feverish but no fever chills documented  Past Medical History  Diagnosis Date  . Chest pain, unspecified   . Palpitations   . Personal history of venous thrombosis and embolism   . Chronic anticoagulation   . Adjustment disorder with anxiety   . Other and unspecified hyperlipidemia   . B12 deficiency due to diet     is a vegetarian  . Unspecified vitamin D deficiency   . Otalgia, unspecified   . Iron deficiency anemia, unspecified   . Rheumatoid arthritis(714.0)     deveshewar  . Headache(784.0)     migraines and head pain  . Hx of varicella     As Child  . UTI (lower urinary tract infection) 11/28/2011    citrobacter       Family History  Problem Relation Age of Onset  . Dementia Father   . Stroke Father   . Hyperlipidemia Father   . Diabetes Father   . Parkinsonism Mother   . Lymphoma      History   Social History  . Marital Status: Married    Spouse Name: N/A    Number of Children: N/A  . Years of Education: N/A   Social History Main Topics  . Smoking status: Never Smoker   . Smokeless tobacco: None  . Alcohol Use: No  . Drug Use: No  . Sexual  Activity: None   Other Topics Concern  . None   Social History Narrative   Regular Exercise-no   20 yrs in [place]   Son at [employer]   From [place].   Married HH  of 3    G1P1             Outpatient Encounter Prescriptions as of 01/09/2014  Medication Sig  . alendronate (FOSAMAX) 70 MG tablet   . Calcium Carb-Cholecalciferol (CALTRATE 600+D) 600-800 MG-UNIT TABS Take by mouth.  Marland Kitchen CARMOL 40 40 % CREA APPLY  CREAM TOPICALLY EVERY DAY  . Cholecalciferol (VITAMIN D-3) 1000 UNITS CAPS Take by mouth.  . COUMADIN 2.5 MG tablet Take as directed by coumadin clinic.  . cycloSPORINE (RESTASIS) 0.05 % ophthalmic emulsion Place 1 drop into both eyes daily.    . folic acid (FOLVITE) 1 MG tablet Take 1 mg by mouth 2 (two) times daily.    . hydroxychloroquine (PLAQUENIL) 200 MG tablet Take 200 mg by mouth 2 (two) times daily.    Marland Kitchen loteprednol (LOTEMAX) 0.2 % SUSP 1 drop 4 (four) times daily.  . mineral/vitamin supplement (MULTIGEN) 70 MG  TABS tablet TAKE ONE TABLET BY MOUTH DAILY.  Marland Kitchen NASCOBAL 500 MCG/0.1ML SOLN USE 50 MCG AS DIRECTED INTO THE NOSE  . NON FORMULARY   . predniSONE (DELTASONE) 1 MG tablet daily. 1.5mg  daily  . SUMAtriptan (IMITREX) 50 MG tablet TAKE ONE TABLET BY MOUTH AS NEEDED-MAY REPEAT IN 2 TO 4 HOURS IF NEEDED  . traMADol (ULTRAM) 50 MG tablet Take 1 tablet (50 mg total) by mouth every 8 (eight) hours as needed.  Marland Kitchen XANAX 0.25 MG tablet TAKE ONE TABLET BY MOUTH AT BEDTIME AS NEEDED FOR SLEEP ,anxiety    EXAM:  BP 110/68  Temp(Src) 98.1 F (36.7 C) (Oral)  Ht 5\' 4"  (1.626 m)  Wt 157 lb (71.215 kg)  BMI 26.94 kg/m2  Body mass index is 26.94 kg/(m^2).  GENERAL: vitals reviewed and listed above, alert, oriented, appears well hydrated and in no acute distress mild anxiety  HEENT: atraumatic, conjunctiva  clear, no obvious abnormalities on inspection of external nose and earstms clear  OP : no lesion edema or exudate  Minimal to no redness no lesion or edema NECK: no obvious  masses on inspection palpation  Points to lower anterior neck scm area  LUNGS: clear to auscultation bilaterally, no wheezes, rales or rhonchi,  CV: HRRR, no clubbing cyanosis  nl cap refill  MS: moves all extremities  PSYCH: pleasant and cooperative, no obvious depression mild anxiety   ASSESSMENT AND PLAN:  Discussed the following assessment and plan:  Sore throat - 1 day right side reassuring exam ear nl cfollow closely.  - Plan: Culture, Group A Strep, POCT rapid strep A  Rheumatoid arthritis(714.0)  Steroid long-term use  Chronic anticoagulation We'll follow close observation is appropriate recheck in 3-4 days if appropriate contact us in the meantime. Strep culture pending -Patient advised to return or notify health care team  if symptoms worsen ,persist or new concerns arise.  Patient Instructions  Exam is reassuring ear is good.  Check lab for strep  Today incase but i doubt.   that this is .  Make appt for  Thursday  Am  And we can always cancel the appt.  This could be a viral    Cause of other . dont think antibiotic is indicated at this time.    Standley Brooking. Shayda Kalka M.D.

## 2014-01-11 LAB — CULTURE, GROUP A STREP: Organism ID, Bacteria: NORMAL

## 2014-01-12 ENCOUNTER — Encounter: Payer: Self-pay | Admitting: Family Medicine

## 2014-01-12 ENCOUNTER — Ambulatory Visit: Payer: BC Managed Care – PPO | Admitting: Internal Medicine

## 2014-01-19 ENCOUNTER — Ambulatory Visit (INDEPENDENT_AMBULATORY_CARE_PROVIDER_SITE_OTHER): Payer: BC Managed Care – PPO | Admitting: Family

## 2014-01-19 DIAGNOSIS — Z5181 Encounter for therapeutic drug level monitoring: Secondary | ICD-10-CM

## 2014-01-19 DIAGNOSIS — R760 Raised antibody titer: Secondary | ICD-10-CM

## 2014-01-19 DIAGNOSIS — I2699 Other pulmonary embolism without acute cor pulmonale: Secondary | ICD-10-CM

## 2014-01-19 DIAGNOSIS — R894 Abnormal immunological findings in specimens from other organs, systems and tissues: Secondary | ICD-10-CM

## 2014-01-19 LAB — POCT INR: INR: 2

## 2014-01-19 NOTE — Patient Instructions (Signed)
Anticoagulation Dose Instructions as of 01/19/2014     [name] Tue Wed Thu Fri Sat   New Dose 2.5 mg 5 mg 5 mg 5 mg 2.5 mg 5 mg 5 mg    Description       Take 1 12 tab tomorrow. Then continue taking 2 tablets everyday except 1 tablet on Sunday and Thursday.  Recheck in 2 weeks

## 2014-01-26 ENCOUNTER — Ambulatory Visit: Payer: BC Managed Care – PPO | Admitting: Family

## 2014-01-30 ENCOUNTER — Ambulatory Visit (INDEPENDENT_AMBULATORY_CARE_PROVIDER_SITE_OTHER): Payer: BC Managed Care – PPO | Admitting: Family

## 2014-01-30 DIAGNOSIS — Z5181 Encounter for therapeutic drug level monitoring: Secondary | ICD-10-CM

## 2014-01-30 DIAGNOSIS — R760 Raised antibody titer: Secondary | ICD-10-CM

## 2014-01-30 DIAGNOSIS — I2699 Other pulmonary embolism without acute cor pulmonale: Secondary | ICD-10-CM

## 2014-01-30 DIAGNOSIS — R894 Abnormal immunological findings in specimens from other organs, systems and tissues: Secondary | ICD-10-CM

## 2014-01-30 LAB — POCT INR: INR: 1.6

## 2014-01-30 NOTE — Patient Instructions (Signed)
continue taking 2 tablets everyday except 1 tablet on Sunday and Thursday.  Recheck in 2 weeks  Anticoagulation Dose Instructions as of 01/30/2014     [name] Tue Wed Thu Fri Sat   New Dose 2.5 mg 5 mg 5 mg 5 mg 2.5 mg 5 mg 5 mg    Description       continue taking 2 tablets everyday except 1 tablet on Sunday and Thursday.  Recheck in 2 weeks

## 2014-02-01 ENCOUNTER — Emergency Department (HOSPITAL_COMMUNITY): Payer: BC Managed Care – PPO

## 2014-02-01 ENCOUNTER — Emergency Department (HOSPITAL_COMMUNITY)
Admission: EM | Admit: 2014-02-01 | Discharge: 2014-02-01 | Disposition: A | Discharge status: Home / Self Care | Payer: BC Managed Care – PPO | Attending: Emergency Medicine | Admitting: Emergency Medicine

## 2014-02-01 ENCOUNTER — Encounter (HOSPITAL_COMMUNITY): Payer: Self-pay | Admitting: Emergency Medicine

## 2014-02-01 DIAGNOSIS — Y9389 Activity, other specified: Secondary | ICD-10-CM | POA: Diagnosis not present

## 2014-02-01 DIAGNOSIS — Z8669 Personal history of other diseases of the nervous system and sense organs: Secondary | ICD-10-CM | POA: Diagnosis not present

## 2014-02-01 DIAGNOSIS — E538 Deficiency of other specified B group vitamins: Secondary | ICD-10-CM | POA: Diagnosis not present

## 2014-02-01 DIAGNOSIS — Z8619 Personal history of other infectious and parasitic diseases: Secondary | ICD-10-CM | POA: Diagnosis not present

## 2014-02-01 DIAGNOSIS — Z79899 Other long term (current) drug therapy: Secondary | ICD-10-CM | POA: Diagnosis not present

## 2014-02-01 DIAGNOSIS — IMO0002 Reserved for concepts with insufficient information to code with codable children: Secondary | ICD-10-CM | POA: Insufficient documentation

## 2014-02-01 DIAGNOSIS — S59909A Unspecified injury of unspecified elbow, initial encounter: Secondary | ICD-10-CM | POA: Diagnosis present

## 2014-02-01 DIAGNOSIS — F4322 Adjustment disorder with anxiety: Secondary | ICD-10-CM | POA: Diagnosis not present

## 2014-02-01 DIAGNOSIS — D509 Iron deficiency anemia, unspecified: Secondary | ICD-10-CM | POA: Diagnosis not present

## 2014-02-01 DIAGNOSIS — S59919A Unspecified injury of unspecified forearm, initial encounter: Secondary | ICD-10-CM

## 2014-02-01 DIAGNOSIS — M069 Rheumatoid arthritis, unspecified: Secondary | ICD-10-CM | POA: Diagnosis not present

## 2014-02-01 DIAGNOSIS — Z7901 Long term (current) use of anticoagulants: Secondary | ICD-10-CM | POA: Diagnosis not present

## 2014-02-01 DIAGNOSIS — S52609A Unspecified fracture of lower end of unspecified ulna, initial encounter for closed fracture: Secondary | ICD-10-CM | POA: Insufficient documentation

## 2014-02-01 DIAGNOSIS — S20212A Contusion of left front wall of thorax, initial encounter: Secondary | ICD-10-CM

## 2014-02-01 DIAGNOSIS — Z86718 Personal history of other venous thrombosis and embolism: Secondary | ICD-10-CM | POA: Insufficient documentation

## 2014-02-01 DIAGNOSIS — Z87440 Personal history of urinary (tract) infections: Secondary | ICD-10-CM | POA: Diagnosis not present

## 2014-02-01 DIAGNOSIS — Y92410 Unspecified street and highway as the place of occurrence of the external cause: Secondary | ICD-10-CM | POA: Insufficient documentation

## 2014-02-01 DIAGNOSIS — E559 Vitamin D deficiency, unspecified: Secondary | ICD-10-CM | POA: Diagnosis not present

## 2014-02-01 DIAGNOSIS — S6990XA Unspecified injury of unspecified wrist, hand and finger(s), initial encounter: Secondary | ICD-10-CM | POA: Insufficient documentation

## 2014-02-01 DIAGNOSIS — S20219A Contusion of unspecified front wall of thorax, initial encounter: Secondary | ICD-10-CM | POA: Diagnosis not present

## 2014-02-01 DIAGNOSIS — S52202A Unspecified fracture of shaft of left ulna, initial encounter for closed fracture: Secondary | ICD-10-CM

## 2014-02-01 LAB — BASIC METABOLIC PANEL
Anion gap: 12 (ref 5–15)
BUN: 10 mg/dL (ref 6–23)
CO2: 25 mEq/L (ref 19–32)
Calcium: 9.2 mg/dL (ref 8.4–10.5)
Chloride: 102 mEq/L (ref 96–112)
Creatinine, Ser: 0.76 mg/dL (ref 0.50–1.10)
GFR calc Af Amer: >90 mL/min (ref >90)
GFR calc non Af Amer: >90 mL/min (ref >90)
Glucose, Bld: 99 mg/dL (ref 70–99)
Potassium: 4.3 mEq/L (ref 3.7–5.3)
Sodium: 139 mEq/L (ref 137–147)

## 2014-02-01 LAB — CBC WITH DIFFERENTIAL/PLATELET
Basophils Absolute: 0 10*3/uL (ref 0.0–0.1)
Basophils Relative: 0 % (ref 0–1)
Eosinophils Absolute: 0 10*3/uL (ref 0.0–0.7)
Eosinophils Relative: 0 % (ref 0–5)
HCT: 44.4 % (ref 36.0–46.0)
Hemoglobin: 14.4 g/dL (ref 12.0–15.0)
Lymphocytes Relative: 16 % (ref 12–46)
Lymphs Abs: 2 10*3/uL (ref 0.7–4.0)
MCH: 26.9 pg (ref 26.0–34.0)
MCHC: 32.4 g/dL (ref 30.0–36.0)
MCV: 83 fL (ref 78.0–100.0)
Monocytes Absolute: 0.9 10*3/uL (ref 0.1–1.0)
Monocytes Relative: 7 % (ref 3–12)
Neutro Abs: 10.1 10*3/uL — ABNORMAL HIGH (ref 1.7–7.7)
Neutrophils Relative %: 77 % (ref 43–77)
Platelets: 226 10*3/uL (ref 150–400)
RBC: 5.35 MIL/uL — ABNORMAL HIGH (ref 3.87–5.11)
RDW: 13.3 % (ref 11.5–15.5)
WBC: 13.1 10*3/uL — ABNORMAL HIGH (ref 4.0–10.5)

## 2014-02-01 LAB — PROTIME-INR
INR: 1.51 — ABNORMAL HIGH (ref 0.00–1.49)
Prothrombin Time: 18.2 seconds — ABNORMAL HIGH (ref 11.6–15.2)

## 2014-02-01 MED ORDER — OXYCODONE-ACETAMINOPHEN 5-325 MG PO TABS
2.0000 | ORAL_TABLET | Freq: Once | ORAL | Status: AC
  Administered 2014-02-01: 2 via ORAL
  Filled 2014-02-01: qty 2

## 2014-02-01 MED ORDER — ONDANSETRON HCL 4 MG PO TABS: 4.0000 mg | ORAL_TABLET | Freq: Four times a day (QID) | ORAL | Status: DC

## 2014-02-01 MED ORDER — LORAZEPAM 1 MG PO TABS
1.0000 mg | ORAL_TABLET | Freq: Once | ORAL | Status: AC
  Administered 2014-02-01: 1 mg via ORAL
  Filled 2014-02-01: qty 1

## 2014-02-01 MED ORDER — ONDANSETRON 4 MG PO TBDP
8.0000 mg | ORAL_TABLET | Freq: Once | ORAL | Status: AC
  Administered 2014-02-01: 8 mg via ORAL
  Filled 2014-02-01: qty 2

## 2014-02-01 MED ORDER — OXYCODONE-ACETAMINOPHEN 5-325 MG PO TABS: 1.0000 | ORAL_TABLET | ORAL | Status: DC | PRN

## 2014-02-01 MED ORDER — IBUPROFEN 400 MG PO TABS
400.0000 mg | ORAL_TABLET | Freq: Once | ORAL | Status: DC
  Filled 2014-02-01: qty 1

## 2014-02-01 NOTE — ED Notes (Signed)
ortho tech paged. 

## 2014-02-01 NOTE — ED Notes (Signed)
orthotech responded, on the way

## 2014-02-01 NOTE — Discharge Instructions (Signed)
Chest Contusion A chest contusion is a deep bruise on your chest area. Contusions are the result of an injury that caused bleeding under the skin. A chest contusion may involve bruising of the skin, muscles, or ribs. The contusion may turn blue, purple, or yellow. Minor injuries will give you a painless contusion, but more severe contusions may stay painful and swollen for a few weeks. CAUSES  A contusion is usually caused by a blow, trauma, or direct force to an area of the body. SYMPTOMS   Swelling and redness of the injured area.  Discoloration of the injured area.  Tenderness and soreness of the injured area.  Pain. DIAGNOSIS  The diagnosis can be made by taking a history and performing a physical exam. An X-ray, CT scan, or MRI may be needed to determine if there were any associated injuries, such as broken bones (fractures) or internal injuries. TREATMENT  Often, the best treatment for a chest contusion is resting, icing, and applying cold compresses to the injured area. Deep breathing exercises may be recommended to reduce the risk of pneumonia. Over-the-counter medicines may also be recommended for pain control. HOME CARE INSTRUCTIONS   Put ice on the injured area.  Put ice in a plastic bag.  Place a towel between your skin and the bag.  Leave the ice on for 15-20 minutes, 03-04 times a day.  Only take over-the-counter or prescription medicines as directed by your caregiver. Your caregiver may recommend avoiding anti-inflammatory medicines (aspirin, ibuprofen, and naproxen) for 48 hours because these medicines may increase bruising.  Rest the injured area.  Perform deep-breathing exercises as directed by your caregiver.  Stop smoking if you smoke.  Do not lift objects over 5 pounds (2.3 kg) for 3 days or longer if recommended by your caregiver. SEEK IMMEDIATE MEDICAL CARE IF:   You have increased bruising or swelling.  You have pain that is getting worse.  You have  difficulty breathing.  You have dizziness, weakness, or fainting.  You have blood in your urine or stool.  You cough up or vomit blood.  Your swelling or pain is not relieved with medicines. MAKE SURE YOU:   Understand these instructions.  Will watch your condition.  Will get help right away if you are not doing well or get worse. Document Released: 01/28/2001 Document Revised: 01/28/2012 Document Reviewed: 10/27/2011 [facility] Patient Information 2015 [place], [place]. This information is not intended to replace advice given to you by your health care provider. Make sure you discuss any questions you have with your health care provider.  Cast or Splint Care Casts and splints support injured limbs and keep bones from moving while they heal. It is important to care for your cast or splint at home.  HOME CARE INSTRUCTIONS  Keep the cast or splint uncovered during the drying period. It can take 24 to 48 hours to dry if it is made of plaster. A fiberglass cast will dry in less than 1 hour.  Do not rest the cast on anything harder than a pillow for the first 24 hours.  Do not put weight on your injured limb or apply pressure to the cast until your health care provider gives you permission.  Keep the cast or splint dry. Wet casts or splints can lose their shape and may not support the limb as well. A wet cast that has lost its shape can also create harmful pressure on your skin when it dries. Also, wet skin can become infected.  Cover the cast or splint with a plastic bag when bathing or when out in the rain or snow. If the cast is on the trunk of the body, take sponge baths until the cast is removed.  If your cast does become wet, dry it with a towel or a blow dryer on the cool setting only.  Keep your cast or splint clean. Soiled casts may be wiped with a moistened cloth.  Do not place any hard or soft foreign objects under your cast or splint, such as cotton, toilet paper, lotion, or  powder.  Do not try to scratch the skin under the cast with any object. The object could get stuck inside the cast. Also, scratching could lead to an infection. If itching is a problem, use a blow dryer on a cool setting to relieve discomfort.  Do not trim or cut your cast or remove padding from inside of it.  Exercise all joints next to the injury that are not immobilized by the cast or splint. For example, if you have a long leg cast, exercise the hip joint and toes. If you have an arm cast or splint, exercise the shoulder, elbow, thumb, and fingers.  Elevate your injured arm or leg on 1 or 2 pillows for the first 1 to 3 days to decrease swelling and pain.It is best if you can comfortably elevate your cast so it is higher than your heart. SEEK MEDICAL CARE IF:   Your cast or splint cracks.  Your cast or splint is too tight or too loose.  You have unbearable itching inside the cast.  Your cast becomes wet or develops a soft spot or area.  You have a bad smell coming from inside your cast.  You get an object stuck under your cast.  Your skin around the cast becomes red or raw.  You have new pain or worsening pain after the cast has been applied. SEEK IMMEDIATE MEDICAL CARE IF:   You have fluid leaking through the cast.  You are unable to move your fingers or toes.  You have discolored (blue or white), cool, painful, or very swollen fingers or toes beyond the cast.  You have tingling or numbness around the injured area.  You have severe pain or pressure under the cast.  You have any difficulty with your breathing or have shortness of breath.  You have chest pain. Document Released: 05/02/2000 Document Revised: 02/23/2013 Document Reviewed: 11/11/2012 [facility] Patient Information 2015 [place], [place]. This information is not intended to replace advice given to you by your health care provider. Make sure you discuss any questions you have with your health care  provider.  Forearm Fracture The forearm is between your elbow and your wrist. It has two bones (ulna and radius). A fracture is a break in one or both of these bones. HOME CARE  Raise (elevate) your arm above the level of the heart.  Put ice on the injured area.  Put ice in a plastic bag.  Place a towel between the skin and the bag.  Leave the ice on for 15-20 minutes, 03-04 times a day.  If given a plaster or fiberglass cast:  Do not try to scratch the skin under the cast with sharp or pointed objects.  Check the skin around the cast every day. You may put lotion on any red or sore areas.  Keep the cast dry and clean.  If given a plaster splint:  Wear the splint as told.  You  may loosen the elastic around the splint if the fingers become numb, tingle, or turn cold or blue.  Do not put pressure on any part of the cast or splint. It may break. Rest the cast only on a pillow the first 24 hours until it is fully hardened.  The cast or splint can be protected during bathing with a plastic bag. Do not lower the cast or splint into water.  Only take medicine as told by your doctor. GET HELP RIGHT AWAY IF:   The cast gets damaged or breaks.  You have pain or puffiness (swelling).  The skin or nails below the injury turn blue or gray, or feel cold or numb.  There is a bad smell, new stains, or fluid coming from under the cast. MAKE SURE YOU:   Understand these instructions.  Will watch your condition.  Will get help right away if you are not doing well or get worse. Document Released: 10/22/2007 Document Revised: 07/28/2011 Document Reviewed: 10/22/2007 [facility] Patient Information 2015 [place], [place]. This information is not intended to replace advice given to you by your health care provider. Make sure you discuss any questions you have with your health care provider.

## 2014-02-01 NOTE — ED Notes (Signed)
Dr. Wilson Singer at bedside, patient had concerns about being discharged with being still in pain

## 2014-02-01 NOTE — Progress Notes (Signed)
Orthopedic Tech Progress Note Patient Details:  [name] 1960-09-20 [id number]  Ortho Devices Type of Ortho Device: Ulna gutter splint Ortho Device/Splint Interventions: Application   Cammer, Theodoro Parma 02/01/2014, 2:06 PM
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
## 2014-02-06 ENCOUNTER — Ambulatory Visit (INDEPENDENT_AMBULATORY_CARE_PROVIDER_SITE_OTHER): Payer: BC Managed Care – PPO | Admitting: Family

## 2014-02-06 ENCOUNTER — Encounter: Payer: Self-pay | Admitting: Family Medicine

## 2014-02-06 ENCOUNTER — Ambulatory Visit (INDEPENDENT_AMBULATORY_CARE_PROVIDER_SITE_OTHER): Payer: BC Managed Care – PPO | Admitting: Family Medicine

## 2014-02-06 ENCOUNTER — Telehealth: Payer: Self-pay | Admitting: Internal Medicine

## 2014-02-06 VITALS — BP 101/72 | HR 66 | Temp 98.0°F | Ht 64.0 in | Wt 150.0 lb

## 2014-02-06 DIAGNOSIS — S52202S Unspecified fracture of shaft of left ulna, sequela: Secondary | ICD-10-CM

## 2014-02-06 DIAGNOSIS — R760 Raised antibody titer: Secondary | ICD-10-CM

## 2014-02-06 DIAGNOSIS — S42309S Unspecified fracture of shaft of humerus, unspecified arm, sequela: Secondary | ICD-10-CM

## 2014-02-06 DIAGNOSIS — S20219S Contusion of unspecified front wall of thorax, sequela: Secondary | ICD-10-CM

## 2014-02-06 DIAGNOSIS — IMO0002 Reserved for concepts with insufficient information to code with codable children: Secondary | ICD-10-CM

## 2014-02-06 DIAGNOSIS — I2699 Other pulmonary embolism without acute cor pulmonale: Secondary | ICD-10-CM

## 2014-02-06 DIAGNOSIS — Z5181 Encounter for therapeutic drug level monitoring: Secondary | ICD-10-CM

## 2014-02-06 LAB — POCT INR: INR: 3.1

## 2014-02-06 MED ORDER — OXYCODONE-ACETAMINOPHEN 5-325 MG PO TABS: 1.0000 | ORAL_TABLET | ORAL | Status: DC | PRN

## 2014-02-06 NOTE — Telephone Encounter (Signed)
We have coumadin clinic this afternoon
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
## 2014-02-06 NOTE — Progress Notes (Signed)
Pre visit review using our clinic review tool, if applicable. No additional management support is needed unless otherwise documented below in the visit note. 

## 2014-02-06 NOTE — Patient Instructions (Signed)
Hold Coumadin today. Recheck on Thursday.   Anticoagulation Dose Instructions as of 02/06/2014     Dorene Grebe Tue Wed Thu Fri Sat   New Dose 2.5 mg 5 mg 5 mg 5 mg 2.5 mg 5 mg 5 mg    Description       Hold Coumadin today. Recheck on Thursday.
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
## 2014-02-08 NOTE — Telephone Encounter (Signed)
DONE

## 2014-02-09 ENCOUNTER — Ambulatory Visit (INDEPENDENT_AMBULATORY_CARE_PROVIDER_SITE_OTHER): Payer: BC Managed Care – PPO | Admitting: Family

## 2014-02-09 DIAGNOSIS — I2699 Other pulmonary embolism without acute cor pulmonale: Secondary | ICD-10-CM

## 2014-02-09 DIAGNOSIS — Z5181 Encounter for therapeutic drug level monitoring: Secondary | ICD-10-CM

## 2014-02-09 DIAGNOSIS — R760 Raised antibody titer: Secondary | ICD-10-CM

## 2014-02-09 LAB — POCT INR: INR: 1.7
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
## 2014-02-09 NOTE — Patient Instructions (Signed)
Continue 5mg  (2 tabs) everyday except 2.5mg  (1 tab) on Sundays and Thursdays. Patient request to recheck in 1 week.   Anticoagulation Dose Instructions as of 02/09/2014     [name] Tue Wed Thu Fri Sat   New Dose 2.5 mg 5 mg 5 mg 5 mg 2.5 mg 5 mg 5 mg    Description       Continue 5mg  (2 tabs) everyday except 2.5mg  (1 tab) on Sundays and Thursdays. Patient request to recheck in 1 week.

## 2014-02-15 ENCOUNTER — Telehealth: Payer: Self-pay | Admitting: Internal Medicine
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
## 2014-02-15 NOTE — Telephone Encounter (Signed)
11 30 Friday ok

## 2014-02-16 ENCOUNTER — Ambulatory Visit: Payer: BC Managed Care – PPO

## 2014-02-16 ENCOUNTER — Ambulatory Visit (INDEPENDENT_AMBULATORY_CARE_PROVIDER_SITE_OTHER): Payer: BC Managed Care – PPO | Admitting: Family

## 2014-02-16 ENCOUNTER — Ambulatory Visit: Payer: BC Managed Care – PPO | Admitting: Family

## 2014-02-16 DIAGNOSIS — R760 Raised antibody titer: Secondary | ICD-10-CM

## 2014-02-16 DIAGNOSIS — R894 Abnormal immunological findings in specimens from other organs, systems and tissues: Secondary | ICD-10-CM

## 2014-02-16 DIAGNOSIS — Z5181 Encounter for therapeutic drug level monitoring: Secondary | ICD-10-CM

## 2014-02-16 LAB — POCT INR: INR: 2.4

## 2014-02-16 NOTE — Telephone Encounter (Signed)
appt scheduled

## 2014-02-16 NOTE — Patient Instructions (Signed)
Hold Coumadin today. Resume Friday,  Continue 5mg  (2 tabs) everyday except 2.5mg  (1 tab) on Sundays and Thursdays. If you have surgery, stop coumadin 2 days prior. Resume Wednesday evening at 5mg . Thursday 5mg . Recheck on Friday.   Anticoagulation Dose Instructions as of 02/16/2014     Dorene Grebe Tue Wed Thu Fri Sat   New Dose 2.5 mg 5 mg 5 mg 5 mg 2.5 mg 5 mg 5 mg    Description       Hold Coumadin today. Resume Friday,  Continue 5mg  (2 tabs) everyday except 2.5mg  (1 tab) on Sundays and Thursdays. If you have surgery, stop coumadin 2 days prior. Resume Wednesday evening at 5mg . Thursday 5mg . Recheck on Friday.

## 2014-02-17 ENCOUNTER — Ambulatory Visit (INDEPENDENT_AMBULATORY_CARE_PROVIDER_SITE_OTHER): Payer: BC Managed Care – PPO | Admitting: Internal Medicine

## 2014-02-17 ENCOUNTER — Encounter: Payer: Self-pay | Admitting: Internal Medicine

## 2014-02-17 VITALS — BP 114/64 | HR 64 | Temp 97.7°F | Ht 64.0 in | Wt 152.0 lb

## 2014-02-17 DIAGNOSIS — Z7901 Long term (current) use of anticoagulants: Secondary | ICD-10-CM

## 2014-02-17 DIAGNOSIS — S52202S Unspecified fracture of shaft of left ulna, sequela: Secondary | ICD-10-CM

## 2014-02-17 DIAGNOSIS — Z7952 Long term (current) use of systemic steroids: Secondary | ICD-10-CM

## 2014-02-17 DIAGNOSIS — F419 Anxiety disorder, unspecified: Secondary | ICD-10-CM

## 2014-02-17 NOTE — Patient Instructions (Signed)
Contact about coumadin protochol  but should be ok either way. Ask more ? s to help her decide   Left hand is dominant hand . Ask about worse case if doesn't do surgery . Function is more important than deformity.
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
## 2014-02-23 ENCOUNTER — Telehealth: Payer: Self-pay | Admitting: Family

## 2014-02-23 ENCOUNTER — Other Ambulatory Visit: Payer: Self-pay | Admitting: Family

## 2014-02-23 DIAGNOSIS — I82409 Acute embolism and thrombosis of unspecified deep veins of unspecified lower extremity: Secondary | ICD-10-CM

## 2014-02-23 NOTE — Telephone Encounter (Signed)
Darrick Penna will assist this patient  For her  PT/INR , Harriet Pho from [facility] asked if you can add OT on the referral to evaluate and treat due to recent surgery , because they won't be able to just go in for labs .  Juliann Pulse also states she can assist  The patient today could you sign the NEW  referral once you change it , and I will send it over to [facility] [phone] If you have questions you may call

## 2014-02-23 NOTE — Telephone Encounter (Signed)
I have signed 2 forms. Do I need to do anything additional?

## 2014-02-23 NOTE — Telephone Encounter (Addendum)
Updated the referral sent to [facility] to schedule   Yes can you can add OT on the referral to evaluate and treat due to patient  recent surgery please , because they wont just do the PT/INR [facility] can  assist the patient thank you

## 2014-02-24 ENCOUNTER — Ambulatory Visit (INDEPENDENT_AMBULATORY_CARE_PROVIDER_SITE_OTHER): Payer: BC Managed Care – PPO | Admitting: Family

## 2014-02-24 ENCOUNTER — Telehealth: Payer: Self-pay | Admitting: Internal Medicine

## 2014-02-24 DIAGNOSIS — R894 Abnormal immunological findings in specimens from other organs, systems and tissues: Secondary | ICD-10-CM

## 2014-02-24 DIAGNOSIS — Z5181 Encounter for therapeutic drug level monitoring: Secondary | ICD-10-CM

## 2014-02-24 DIAGNOSIS — R760 Raised antibody titer: Secondary | ICD-10-CM

## 2014-02-24 LAB — POCT INR: INR: 1.7

## 2014-02-24 NOTE — Telephone Encounter (Signed)
Dorian Pod with [facility] called to say that patient cannot have home OT. She is not considered home bound and her BCBS will not pay for it. She will need an order for outpatient OT.

## 2014-02-24 NOTE — Telephone Encounter (Signed)
Advise patient her insurance will not pay to have PT/INR drawn. Needs to have PT checked today. Come in at 2pm

## 2014-02-24 NOTE — Patient Instructions (Signed)
Tab 1 tab Friday, Sat, and Sunday. Recheck Monday.   Anticoagulation Dose Instructions as of 02/24/2014     Dorene Grebe Tue Wed Thu Fri Sat   New Dose 2.5 mg 5 mg 5 mg 5 mg 2.5 mg 2.5 mg 2.5 mg    Description       Tab 1 tab Friday, Sat, and Sunday. Recheck Monday.     \

## 2014-02-27 ENCOUNTER — Ambulatory Visit (INDEPENDENT_AMBULATORY_CARE_PROVIDER_SITE_OTHER): Payer: BC Managed Care – PPO | Admitting: Family

## 2014-02-27 DIAGNOSIS — R760 Raised antibody titer: Secondary | ICD-10-CM

## 2014-02-27 DIAGNOSIS — Z5181 Encounter for therapeutic drug level monitoring: Secondary | ICD-10-CM

## 2014-02-27 DIAGNOSIS — R894 Abnormal immunological findings in specimens from other organs, systems and tissues: Secondary | ICD-10-CM

## 2014-02-27 LAB — POCT INR: INR: 1.6

## 2014-02-27 NOTE — Patient Instructions (Addendum)
Take 5 mg Monday and Friday. All other days, 2.5mg   Anticoagulation Dose Instructions as of 02/27/2014     Dorene Grebe Tue Wed Thu Fri Sat   New Dose 2.5 mg 5 mg 2.5 mg 2.5 mg 2.5 mg 5 mg 2.5 mg    Description       Take 5 mg Monday and Friday. All other days, 2.5mg 

## 2014-03-02 ENCOUNTER — Ambulatory Visit (INDEPENDENT_AMBULATORY_CARE_PROVIDER_SITE_OTHER): Payer: BC Managed Care – PPO | Admitting: Psychology

## 2014-03-02 DIAGNOSIS — F411 Generalized anxiety disorder: Secondary | ICD-10-CM

## 2014-03-06 ENCOUNTER — Ambulatory Visit (INDEPENDENT_AMBULATORY_CARE_PROVIDER_SITE_OTHER): Payer: BC Managed Care – PPO | Admitting: Family

## 2014-03-06 DIAGNOSIS — R760 Raised antibody titer: Secondary | ICD-10-CM

## 2014-03-06 DIAGNOSIS — R894 Abnormal immunological findings in specimens from other organs, systems and tissues: Secondary | ICD-10-CM

## 2014-03-06 DIAGNOSIS — Z5181 Encounter for therapeutic drug level monitoring: Secondary | ICD-10-CM

## 2014-03-06 LAB — POCT INR: INR: 1.5

## 2014-03-10 ENCOUNTER — Other Ambulatory Visit: Payer: Self-pay | Admitting: Internal Medicine

## 2014-03-13 ENCOUNTER — Ambulatory Visit (INDEPENDENT_AMBULATORY_CARE_PROVIDER_SITE_OTHER): Payer: BC Managed Care – PPO | Admitting: Family

## 2014-03-13 DIAGNOSIS — R760 Raised antibody titer: Secondary | ICD-10-CM

## 2014-03-13 DIAGNOSIS — R894 Abnormal immunological findings in specimens from other organs, systems and tissues: Secondary | ICD-10-CM

## 2014-03-13 DIAGNOSIS — Z5181 Encounter for therapeutic drug level monitoring: Secondary | ICD-10-CM

## 2014-03-13 LAB — POCT INR: INR: 1.6

## 2014-03-13 NOTE — Patient Instructions (Signed)
Take 5 mg Monday, Wednesday and Friday. All other days, 2.5mg . Recheck in 1 week  Anticoagulation Dose Instructions as of 03/13/2014     Dorene Grebe Tue Wed Thu Fri Sat   New Dose 2.5 mg 5 mg 2.5 mg 5 mg 2.5 mg 5 mg 2.5 mg    Description       Take 5 mg Monday, Wednesday and Friday. All other days, 2.5mg . Recheck in 1 week.

## 2014-03-15 NOTE — Telephone Encounter (Signed)
Sent to the pharmacy by e-scribe. 

## 2014-03-15 NOTE — Telephone Encounter (Signed)
Ok to refill 6 months 

## 2014-03-20 ENCOUNTER — Encounter: Payer: Self-pay | Admitting: Internal Medicine

## 2014-03-21 ENCOUNTER — Ambulatory Visit (INDEPENDENT_AMBULATORY_CARE_PROVIDER_SITE_OTHER): Payer: BC Managed Care – PPO | Admitting: Family

## 2014-03-21 DIAGNOSIS — Z5181 Encounter for therapeutic drug level monitoring: Secondary | ICD-10-CM

## 2014-03-21 DIAGNOSIS — R894 Abnormal immunological findings in specimens from other organs, systems and tissues: Secondary | ICD-10-CM

## 2014-03-21 DIAGNOSIS — R760 Raised antibody titer: Secondary | ICD-10-CM

## 2014-03-21 LAB — POCT INR: INR: 1.5

## 2014-03-21 NOTE — Patient Instructions (Signed)
Take 5 mg Monday, Tuesday, Wednesday, Friday 5mg  (2 tabs). All other days, 2.5mg . Recheck in 2 week.   Anticoagulation Dose Instructions as of 03/21/2014      [name] Tue Wed Thu Fri Sat   New Dose 2.5 mg 5 mg 5 mg 5 mg 2.5 mg 5 mg 2.5 mg    Description        Take 5 mg Monday, Tuesday, Wednesday, Friday 5mg  (2 tabs). All other days, 2.5mg . Recheck in 2 week.

## 2014-03-22 ENCOUNTER — Other Ambulatory Visit: Payer: Self-pay

## 2014-03-22 DIAGNOSIS — Z1231 Encounter for screening mammogram for malignant neoplasm of breast: Secondary | ICD-10-CM

## 2014-04-04 ENCOUNTER — Telehealth: Payer: Self-pay | Admitting: Family Medicine

## 2014-04-04 ENCOUNTER — Ambulatory Visit: Payer: BC Managed Care – PPO

## 2014-04-04 ENCOUNTER — Encounter: Payer: Self-pay | Admitting: *Deleted

## 2014-04-04 ENCOUNTER — Ambulatory Visit (INDEPENDENT_AMBULATORY_CARE_PROVIDER_SITE_OTHER): Payer: BC Managed Care – PPO | Admitting: Internal Medicine

## 2014-04-04 DIAGNOSIS — Z5181 Encounter for therapeutic drug level monitoring: Secondary | ICD-10-CM

## 2014-04-04 DIAGNOSIS — I2699 Other pulmonary embolism without acute cor pulmonale: Secondary | ICD-10-CM

## 2014-04-04 DIAGNOSIS — Z7901 Long term (current) use of anticoagulants: Secondary | ICD-10-CM

## 2014-04-04 DIAGNOSIS — R894 Abnormal immunological findings in specimens from other organs, systems and tissues: Secondary | ICD-10-CM

## 2014-04-04 DIAGNOSIS — R760 Raised antibody titer: Secondary | ICD-10-CM

## 2014-04-04 LAB — POCT INR: INR: 2.5

## 2014-04-04 NOTE — Telephone Encounter (Signed)
Spoke to the pt about her result on 2.5.  Asked her what are the directions on her coumadin.  She stated that she takes 5 mg on Mon, Tues, Wed and Fri.  Takes 2.5 mg on Thurs, Sat and Sun.  Is also taking Cipro due to UTI.  Has two more days to take.  Per WP, pt should take hold coumadin today, take 2.5 mg tomorrow and then back to her regular schedule.  Recheck in one week.  Pt notified of by telephone.

## 2014-04-05 ENCOUNTER — Ambulatory Visit: Payer: BC Managed Care – PPO | Admitting: Psychology

## 2014-04-10 ENCOUNTER — Ambulatory Visit: Payer: BC Managed Care – PPO | Admitting: Psychology

## 2014-04-10 ENCOUNTER — Ambulatory Visit (INDEPENDENT_AMBULATORY_CARE_PROVIDER_SITE_OTHER): Payer: BC Managed Care – PPO | Admitting: Psychology

## 2014-04-10 DIAGNOSIS — F411 Generalized anxiety disorder: Secondary | ICD-10-CM

## 2014-04-11 ENCOUNTER — Ambulatory Visit (INDEPENDENT_AMBULATORY_CARE_PROVIDER_SITE_OTHER): Payer: BC Managed Care – PPO | Admitting: Family

## 2014-04-11 ENCOUNTER — Other Ambulatory Visit: Payer: Self-pay | Admitting: Internal Medicine

## 2014-04-11 DIAGNOSIS — R894 Abnormal immunological findings in specimens from other organs, systems and tissues: Secondary | ICD-10-CM

## 2014-04-11 DIAGNOSIS — R760 Raised antibody titer: Secondary | ICD-10-CM

## 2014-04-11 DIAGNOSIS — Z5181 Encounter for therapeutic drug level monitoring: Secondary | ICD-10-CM

## 2014-04-11 LAB — POCT INR: INR: 1.3

## 2014-04-11 NOTE — Patient Instructions (Signed)
Take an extra 2.5mg  today only. Then continue taking 5 mg Monday, Tuesday, Wednesday, Friday 5mg  (2 tabs). All other days, 2.5mg . Recheck in 2 week.   Anticoagulation Dose Instructions as of 04/11/2014      Dorene Grebe Tue Wed Thu Fri Sat   New Dose 2.5 mg 5 mg 5 mg 5 mg 2.5 mg 5 mg 2.5 mg    Description        Take an extra 2.5mg  today only. Then continue taking 5 mg Monday, Tuesday, Wednesday, Friday 5mg  (2 tabs). All other days, 2.5mg . Recheck in 2 week.

## 2014-04-12 ENCOUNTER — Ambulatory Visit: Payer: BC Managed Care – PPO | Admitting: Family

## 2014-04-16 NOTE — Progress Notes (Signed)
Pt had recnetly been placed on antibiotic for uti and inr higher than goal  No bleeding.  See recs  hol and then take 1/2 the next day and then back to reg  And recheck in 1-2 weeks

## 2014-04-17 ENCOUNTER — Ambulatory Visit (INDEPENDENT_AMBULATORY_CARE_PROVIDER_SITE_OTHER): Payer: BC Managed Care – PPO | Admitting: Psychology

## 2014-04-17 DIAGNOSIS — F411 Generalized anxiety disorder: Secondary | ICD-10-CM

## 2014-04-18 ENCOUNTER — Ambulatory Visit
Admission: RE | Admit: 2014-04-18 | Discharge: 2014-04-18 | Disposition: A | Discharge status: Home / Self Care | Payer: BC Managed Care – PPO | Source: Ambulatory Visit

## 2014-04-18 DIAGNOSIS — Z1231 Encounter for screening mammogram for malignant neoplasm of breast: Secondary | ICD-10-CM

## 2014-04-24 ENCOUNTER — Ambulatory Visit (INDEPENDENT_AMBULATORY_CARE_PROVIDER_SITE_OTHER): Payer: BC Managed Care – PPO | Admitting: Family

## 2014-04-24 DIAGNOSIS — R894 Abnormal immunological findings in specimens from other organs, systems and tissues: Secondary | ICD-10-CM

## 2014-04-24 DIAGNOSIS — Z5181 Encounter for therapeutic drug level monitoring: Secondary | ICD-10-CM

## 2014-04-24 DIAGNOSIS — R760 Raised antibody titer: Secondary | ICD-10-CM

## 2014-04-24 LAB — POCT INR: INR: 1.5

## 2014-04-24 NOTE — Patient Instructions (Signed)
Continue taking 5 mg Monday, Tuesday, Wednesday, Friday 5mg  (2 tabs). All other days, 2.5mg . Recheck in 2 week.   Anticoagulation Dose Instructions as of 04/24/2014      Dorene Grebe Tue Wed Thu Fri Sat   New Dose 2.5 mg 5 mg 5 mg 5 mg 2.5 mg 5 mg 2.5 mg    Description        Continue taking 5 mg Monday, Tuesday, Wednesday, Friday 5mg  (2 tabs). All other days, 2.5mg . Recheck in 2 week.

## 2014-05-03 ENCOUNTER — Ambulatory Visit (INDEPENDENT_AMBULATORY_CARE_PROVIDER_SITE_OTHER): Payer: BC Managed Care – PPO | Admitting: Psychology

## 2014-05-03 DIAGNOSIS — F411 Generalized anxiety disorder: Secondary | ICD-10-CM

## 2014-05-04 ENCOUNTER — Ambulatory Visit: Payer: BC Managed Care – PPO | Admitting: Family

## 2014-05-11 ENCOUNTER — Ambulatory Visit: Payer: BC Managed Care – PPO | Admitting: Family

## 2014-05-15 ENCOUNTER — Ambulatory Visit (INDEPENDENT_AMBULATORY_CARE_PROVIDER_SITE_OTHER): Payer: BC Managed Care – PPO | Admitting: Family

## 2014-05-15 DIAGNOSIS — R760 Raised antibody titer: Secondary | ICD-10-CM

## 2014-05-15 DIAGNOSIS — Z5181 Encounter for therapeutic drug level monitoring: Secondary | ICD-10-CM

## 2014-05-15 DIAGNOSIS — R894 Abnormal immunological findings in specimens from other organs, systems and tissues: Secondary | ICD-10-CM

## 2014-05-15 LAB — POCT INR: INR: 1.4

## 2014-05-15 NOTE — Patient Instructions (Signed)
Take an extra today only. Then continue taking 5 mg Monday, Tuesday, Wednesday, Friday 5mg  (2 tabs). All other days, 2.5mg . Recheck in 3 week.  Anticoagulation Dose Instructions as of 05/15/2014      [name] Tue Wed Thu Fri Sat   New Dose 2.5 mg 5 mg 5 mg 5 mg 2.5 mg 5 mg 2.5 mg    Description        Take an extra today only. Then continue taking 5 mg Monday, Tuesday, Wednesday, Friday 5mg  (2 tabs). All other days, 2.5mg . Recheck in 3 week.

## 2014-05-16 ENCOUNTER — Ambulatory Visit: Payer: BC Managed Care – PPO | Admitting: Family

## 2014-05-17 ENCOUNTER — Ambulatory Visit (INDEPENDENT_AMBULATORY_CARE_PROVIDER_SITE_OTHER): Payer: BC Managed Care – PPO | Admitting: Psychology

## 2014-05-17 DIAGNOSIS — F411 Generalized anxiety disorder: Secondary | ICD-10-CM

## 2014-05-24 ENCOUNTER — Ambulatory Visit: Payer: BC Managed Care – PPO | Admitting: Psychology

## 2014-05-26 ENCOUNTER — Encounter: Payer: Self-pay | Admitting: Family Medicine

## 2014-05-26 ENCOUNTER — Ambulatory Visit (INDEPENDENT_AMBULATORY_CARE_PROVIDER_SITE_OTHER): Payer: BC Managed Care – PPO | Admitting: Family Medicine

## 2014-05-26 VITALS — BP 100/64 | HR 82 | Temp 97.8°F | Ht 64.0 in | Wt 151.9 lb

## 2014-05-26 DIAGNOSIS — J029 Acute pharyngitis, unspecified: Secondary | ICD-10-CM

## 2014-05-26 NOTE — Progress Notes (Signed)
Pre visit review using our clinic review tool, if applicable. No additional management support is needed unless otherwise documented below in the visit note. 

## 2014-05-26 NOTE — Progress Notes (Signed)
HPI:  Sore Throat: -started: yesterday -symptoms:PND, sore throat, R ear pressure -denies:fever, SOB, NVD, tooth pain -has tried: cough drops -sick contacts/travel/risks: denies flu exposure, tick exposure or or Ebola risks -Hx of: allergies, immunosuppressed  ROS: See pertinent positives and negatives per HPI.  Past Medical History  Diagnosis Date  . Chest pain, unspecified   . Palpitations   . Personal history of venous thrombosis and embolism   . Chronic anticoagulation   . Adjustment disorder with anxiety   . Other and unspecified hyperlipidemia   . B12 deficiency due to diet     is a vegetarian  . Unspecified vitamin D deficiency   . Otalgia, unspecified   . Iron deficiency anemia, unspecified   . Rheumatoid arthritis(714.0)     deveshewar  . Headache(784.0)     migraines and head pain  . Hx of varicella     As Child  . UTI (lower urinary tract infection) 11/28/2011    citrobacter       Past Surgical History  Procedure Laterality Date  . Tympanostomy tube placement  2009    Right    Family History  Problem Relation Age of Onset  . Dementia Father   . Stroke Father   . Hyperlipidemia Father   . Diabetes Father   . Parkinsonism Mother   . Lymphoma      History   Social History  . Marital Status: Married    Spouse Name: N/A    Number of Children: N/A  . Years of Education: N/A   Social History Main Topics  . Smoking status: Never Smoker   . Smokeless tobacco: Never Used  . Alcohol Use: No  . Drug Use: No  . Sexual Activity: None   Other Topics Concern  . None   Social History Narrative   Regular Exercise-no   20 yrs in [place]   Son at [facility]   From [place].   Married HH  of 3    G1P1             Current outpatient prescriptions: alendronate (FOSAMAX) 70 MG tablet, once a week. , Disp: , Rfl: ;  Calcium Carb-Cholecalciferol (CALTRATE 600+D) 600-800 MG-UNIT TABS, Take by mouth., Disp: , Rfl: ;  CARMOL 40 40 % CREA, APPLY  CREAM TOPICALLY  EVERY DAY, Disp: 29 each, Rfl: 1;  Cholecalciferol (VITAMIN D-3) 1000 UNITS CAPS, Take by mouth., Disp: , Rfl: ;  COUMADIN 2.5 MG tablet, Take as directed by coumadin clinic., Disp: 150 tablet, Rfl: 1 cycloSPORINE (RESTASIS) 0.05 % ophthalmic emulsion, Place 1 drop into both eyes daily.  , Disp: , Rfl: ;  folic acid (FOLVITE) 1 MG tablet, Take 1 mg by mouth 2 (two) times daily.  , Disp: , Rfl: ;  hydroxychloroquine (PLAQUENIL) 200 MG tablet, Take 200 mg by mouth 2 (two) times daily.  , Disp: , Rfl: ;  methotrexate (RHEUMATREX) 15 MG tablet, Take 15 mg by mouth once a week. Caution: Chemotherapy. Protect from light., Disp: , Rfl:  mineral/vitamin supplement (MULTIGEN) 70 MG TABS tablet, TAKE ONE TABLET BY MOUTH DAILY., Disp: 30 each, Rfl: 5;  NASCOBAL 500 MCG/0.1ML SOLN, USE 50 MCG AS DIRECTED INTO THE NOSE., Disp: 2 Bottle, Rfl: 2;  NON FORMULARY, , Disp: , Rfl: ;  predniSONE (DELTASONE) 1 MG tablet, daily. 2mg  daily, Disp: , Rfl: ;  SUMAtriptan (IMITREX) 50 MG tablet, TAKE ONE TABLET BY MOUTH AS NEEDED-MAY REPEAT IN 2 TO 4 HOURS IF NEEDED, Disp: 9 tablet, Rfl:  0 XANAX 0.25 MG tablet, TAKE ONE TABLET BY MOUTH AT BEDTIME AS NEEDED FOR SLEEP ,anxiety, Disp: 60 tablet, Rfl: 3  EXAM:  Filed Vitals:   05/26/14 1513  BP: 100/64  Pulse: 82  Temp: 97.8 F (36.6 C)    Body mass index is 26.06 kg/(m^2).  GENERAL: vitals reviewed and listed above, alert, oriented, appears well hydrated and in no acute distress  HEENT: atraumatic, conjunttiva clear, no obvious abnormalities on inspection of external nose and ears, normal appearance of ear canals and TMs, clear nasal congestion, mild post oropharyngeal erythema with PND, no tonsillar edema or exudate, no sinus TTP  NECK: no obvious masses on inspection  LUNGS: clear to auscultation bilaterally, no wheezes, rales or rhonchi, good air movement  CV: HRRR, no peripheral edema  MS: moves all extremities without noticeable abnormality  PSYCH: pleasant and  cooperative, no obvious depression or anxiety  ASSESSMENT AND PLAN:  Discussed the following assessment and plan:  Sore throat  -given HPI and exam findings today, a serious infection or illness is unlikely. We discussed potential etiologies, with VURI being most likely, and advised supportive care and monitoring. We discussed treatment side effects, likely course, antibiotic misuse, transmission, and signs of developing a serious illness. -given her imunosuppresed state advised if worsening, fevers, sinus pain, SOB or other concerns to seek re-evaluation immediatly    Patient Instructions  INSTRUCTIONS FOR UPPER RESPIRATORY INFECTION:  -plenty of rest and fluids  -nasal saline wash 2-3 times daily (use prepackaged nasal saline or bottled/distilled water if making your own)   -clean nose with nasal saline before using the nasal steroid or sinex  -AFRIN nasal spray  -can use tylenol or ibuprofen as directed for aches and sorethroat  -in the winter time, using a humidifier at night is helpful (please follow cleaning instructions)  -if you are taking a cough medication - use only as directed, may also try a teaspoon of honey to coat the throat and throat lozenges  -for sore throat, salt water gargles can help  -follow up if you have fevers, facial pain, tooth pain, difficulty breathing or are worsening seek care immediately or not getting better in 5-7 days      KIM, HANNAH R.

## 2014-05-26 NOTE — Patient Instructions (Addendum)
INSTRUCTIONS FOR UPPER RESPIRATORY INFECTION:  -plenty of rest and fluids  -nasal saline wash 2-3 times daily (use prepackaged nasal saline or bottled/distilled water if making your own)   -clean nose with nasal saline before using the nasal steroid or sinex  -AFRIN nasal spray  -can use tylenol or ibuprofen as directed for aches and sorethroat  -in the winter time, using a humidifier at night is helpful (please follow cleaning instructions)  -if you are taking a cough medication - use only as directed, may also try a teaspoon of honey to coat the throat and throat lozenges  -for sore throat, salt water gargles can help  -follow up if you have fevers, facial pain, tooth pain, difficulty breathing or are worsening seek care immediately or not getting better in 5-7 days

## 2014-05-30 ENCOUNTER — Encounter: Payer: Self-pay | Admitting: Internal Medicine

## 2014-05-30 ENCOUNTER — Other Ambulatory Visit (INDEPENDENT_AMBULATORY_CARE_PROVIDER_SITE_OTHER): Payer: BC Managed Care – PPO

## 2014-05-30 ENCOUNTER — Ambulatory Visit (INDEPENDENT_AMBULATORY_CARE_PROVIDER_SITE_OTHER): Payer: BC Managed Care – PPO | Admitting: Family

## 2014-05-30 DIAGNOSIS — Z5181 Encounter for therapeutic drug level monitoring: Secondary | ICD-10-CM

## 2014-05-30 DIAGNOSIS — R894 Abnormal immunological findings in specimens from other organs, systems and tissues: Secondary | ICD-10-CM

## 2014-05-30 DIAGNOSIS — R760 Raised antibody titer: Secondary | ICD-10-CM

## 2014-05-30 DIAGNOSIS — Z0000 Encounter for general adult medical examination without abnormal findings: Secondary | ICD-10-CM

## 2014-05-30 DIAGNOSIS — E538 Deficiency of other specified B group vitamins: Secondary | ICD-10-CM

## 2014-05-30 DIAGNOSIS — E559 Vitamin D deficiency, unspecified: Secondary | ICD-10-CM

## 2014-05-30 DIAGNOSIS — D509 Iron deficiency anemia, unspecified: Secondary | ICD-10-CM

## 2014-05-30 DIAGNOSIS — Z7901 Long term (current) use of anticoagulants: Secondary | ICD-10-CM

## 2014-05-30 DIAGNOSIS — I2699 Other pulmonary embolism without acute cor pulmonale: Secondary | ICD-10-CM

## 2014-05-30 LAB — CBC WITH DIFFERENTIAL/PLATELET
BASOS ABS: 0.1 10*3/uL (ref 0.0–0.1)
BASOS PCT: 0.6 % (ref 0.0–3.0)
Eosinophils Absolute: 0.2 10*3/uL (ref 0.0–0.7)
Eosinophils Relative: 1.8 % (ref 0.0–5.0)
HCT: 42 % (ref 36.0–46.0)
Hemoglobin: 13.5 g/dL (ref 12.0–15.0)
LYMPHS ABS: 3.3 10*3/uL (ref 0.7–4.0)
Lymphocytes Relative: 39.3 % (ref 12.0–46.0)
MCHC: 32.1 g/dL (ref 30.0–36.0)
MCV: 84.7 fl (ref 78.0–100.0)
MONO ABS: 0.6 10*3/uL (ref 0.1–1.0)
Monocytes Relative: 7.2 % (ref 3.0–12.0)
NEUTROS PCT: 51.1 % (ref 43.0–77.0)
Neutro Abs: 4.3 10*3/uL (ref 1.4–7.7)
PLATELETS: 252 10*3/uL (ref 150.0–400.0)
RBC: 4.95 Mil/uL (ref 3.87–5.11)
RDW: 14 % (ref 11.5–15.5)
WBC: 8.5 10*3/uL (ref 4.0–10.5)

## 2014-05-30 LAB — HEPATIC FUNCTION PANEL
ALK PHOS: 89 U/L (ref 39–117)
ALT: 15 U/L (ref 0–35)
AST: 18 U/L (ref 0–37)
Albumin: 3.8 g/dL (ref 3.5–5.2)
BILIRUBIN DIRECT: 0.1 mg/dL (ref 0.0–0.3)
Total Bilirubin: 0.9 mg/dL (ref 0.2–1.2)
Total Protein: 7.3 g/dL (ref 6.0–8.3)

## 2014-05-30 LAB — LIPID PANEL
Cholesterol: 197 mg/dL (ref 0–200)
HDL: 76.6 mg/dL (ref >39.00)
LDL Cholesterol: 96 mg/dL (ref 0–99)
NonHDL: 120.4
Total CHOL/HDL Ratio: 3
Triglycerides: 121 mg/dL (ref 0.0–149.0)
VLDL: 24.2 mg/dL (ref 0.0–40.0)

## 2014-05-30 LAB — BASIC METABOLIC PANEL
BUN: 8 mg/dL (ref 6–23)
CHLORIDE: 105 meq/L (ref 96–112)
CO2: 28 mEq/L (ref 19–32)
Calcium: 8.9 mg/dL (ref 8.4–10.5)
Creatinine, Ser: 0.7 mg/dL (ref 0.4–1.2)
GFR: 98.5 mL/min (ref >60.00)
Glucose, Bld: 89 mg/dL (ref 70–99)
POTASSIUM: 4.4 meq/L (ref 3.5–5.1)
SODIUM: 137 meq/L (ref 135–145)

## 2014-05-30 LAB — POCT INR: INR: 1.2

## 2014-05-30 LAB — IRON: IRON: 60 ug/dL (ref 42–145)

## 2014-05-30 LAB — TSH: TSH: 3.31 u[IU]/mL (ref 0.35–4.50)

## 2014-05-30 LAB — VITAMIN D 25 HYDROXY (VIT D DEFICIENCY, FRACTURES): VITD: 30.02 ng/mL (ref 30.00–100.00)

## 2014-05-30 LAB — VITAMIN B12: VITAMIN B 12: 504 pg/mL (ref 211–911)

## 2014-05-30 NOTE — Patient Instructions (Signed)
Take an extra 1/2 tab today and tomorrow. Then continue taking 5 mg Monday, Tuesday, Wednesday, Friday 5mg  (2 tabs). All other days, 2.5mg . Recheck in 1 week.   Anticoagulation Dose Instructions as of 05/30/2014      Dorene Grebe Tue Wed Thu Fri Sat   New Dose 2.5 mg 5 mg 5 mg 5 mg 2.5 mg 5 mg 2.5 mg    Description        Take an extra 1/2 tab today and tomorrow. Then continue taking 5 mg Monday, Tuesday, Wednesday, Friday 5mg  (2 tabs). All other days, 2.5mg . Recheck in 1 week.

## 2014-05-30 NOTE — Addendum Note (Signed)
Addended by: Elmer Picker on: 05/30/2014 09:13 AM   Modules accepted: Orders

## 2014-05-31 ENCOUNTER — Ambulatory Visit: Payer: BC Managed Care – PPO | Admitting: Psychology

## 2014-06-06 ENCOUNTER — Ambulatory Visit (INDEPENDENT_AMBULATORY_CARE_PROVIDER_SITE_OTHER): Payer: BC Managed Care – PPO | Admitting: Internal Medicine

## 2014-06-06 VITALS — BP 106/76 | Temp 97.5°F | Ht 63.0 in | Wt 152.0 lb

## 2014-06-06 DIAGNOSIS — Z7901 Long term (current) use of anticoagulants: Secondary | ICD-10-CM

## 2014-06-06 DIAGNOSIS — M810 Age-related osteoporosis without current pathological fracture: Secondary | ICD-10-CM

## 2014-06-06 DIAGNOSIS — H40059 Ocular hypertension, unspecified eye: Secondary | ICD-10-CM

## 2014-06-06 DIAGNOSIS — Z79899 Other long term (current) drug therapy: Secondary | ICD-10-CM

## 2014-06-06 DIAGNOSIS — F4322 Adjustment disorder with anxiety: Secondary | ICD-10-CM

## 2014-06-06 DIAGNOSIS — M069 Rheumatoid arthritis, unspecified: Secondary | ICD-10-CM

## 2014-06-06 DIAGNOSIS — R760 Raised antibody titer: Secondary | ICD-10-CM

## 2014-06-06 DIAGNOSIS — Z87898 Personal history of other specified conditions: Secondary | ICD-10-CM

## 2014-06-06 DIAGNOSIS — Z5181 Encounter for therapeutic drug level monitoring: Secondary | ICD-10-CM

## 2014-06-06 DIAGNOSIS — Z7952 Long term (current) use of systemic steroids: Secondary | ICD-10-CM

## 2014-06-06 DIAGNOSIS — Z0000 Encounter for general adult medical examination without abnormal findings: Secondary | ICD-10-CM

## 2014-06-06 DIAGNOSIS — Z789 Other specified health status: Secondary | ICD-10-CM

## 2014-06-06 LAB — POCT INR: INR: 1.3

## 2014-06-06 MED ORDER — APIXABAN 2.5 MG PO TABS: 2.5000 mg | ORAL_TABLET | Freq: Two times a day (BID) | ORAL | Status: DC

## 2014-06-06 NOTE — Patient Instructions (Signed)
Take an extra 1/2 tab today and Saturday. Then continue taking 5 mg Monday, Tuesday, Wednesday, Friday and Saturday 5mg  (2 tabs). All other days, 2.5mg . Recheck in 2 week.   Anticoagulation Dose Instructions as of 06/06/2014      Dorene Grebe Tue Wed Thu Fri Sat   New Dose 2.5 mg 5 mg 5 mg 5 mg 2.5 mg 5 mg 5 mg    Description        Take an extra 1/2 tab today and Saturday. Then continue taking 5 mg Monday, Tuesday, Wednesday, Friday and Saturday 5mg  (2 tabs). All other days, 2.5mg . Recheck in 2 week.

## 2014-06-06 NOTE — Progress Notes (Signed)
Pre visit review using our clinic review tool, if applicable. No additional management support is needed unless otherwise documented below in the visit note.  Chief Complaint  Patient presents with  . Annual Exam    ask about, change to eliquis osteoprosis etc     HPI: Patient  [name]  54 y.o. comes in today for Preventive Health Care visit  And Chronic disease management  Discussion. She carried the dx of RA,OSteoporosis on chronic lifelong  anticoagulation for hx of PE with anti cardiolipin positive, felt to be hypercoagulable state,b12 , iron deficiency , vit d defic  Recurrent UTI and reactive anxiety to her medical situation who recently had left arm surgery after a fracture  .  She is doing fairly well still rehabing the arm . Uncertain if had varicella  Her heme specialist told her she could do eliquis  2.5 bid if she wishes to switch from coumadin. Ask opinion .  She is back on mtx  For now  With low dose pred and plaquenil.  Has seen dr Jola Schmidt office and has inc IOP and watching. No sx  ? See Dr Roe Rutherford endo   opinion about the osteoporosis.  [facility] [place] [facility]    Health Maintenance  Topic Date Due  . INFLUENZA VACCINE  01/18/2015 (Originally 12/17/2013)  . MAMMOGRAM  04/18/2016  . COLONOSCOPY  05/19/2016  . PAP SMEAR  06/15/2016  . TETANUS/TDAP  06/01/2023   Health Maintenance Review LIFESTYLE:  Exercise:  Walking some   Had hand surgery.  PT  Tobacco/ETS: no Alcohol:   none Sugar beverages: no Sleep: at least 8 hours  Drug use: no Bone density:  Colonoscopy: age 82 due 2018 vs 3 year.  PAP: next  Week Christophe Louis  MAMMO:nov 15    ROS:  GEN/ HEENT: No fever, significant weight changes sweats headaches vision problems hearing changes, CV/ PULM; No chest pain shortness of breath cough, syncope,edema  change in exercise tolerance. GI /GU: No adominal pain, vomiting, change in bowel habits. No blood in the stool. No significant GU symptoms. SKIN/HEME:  ,no acute skin rashes suspicious lesions or bleeding. No lymphadenopathy, nodules, masses.  NEURO/ PSYCH:  No neurologic signs such as weakness numbness. Take alprax pr anxiety usually sleep  Time  Aware of risk benefit IMM/ Allergy: No unusual infections.  Allergy .   REST of 12 system review negative except as per HPI   Past Medical History  Diagnosis Date  . Chest pain, unspecified   . Palpitations   . Personal history of venous thrombosis and embolism   . Chronic anticoagulation   . Adjustment disorder with anxiety   . Other and unspecified hyperlipidemia   . B12 deficiency due to diet     is a vegetarian  . Unspecified vitamin D deficiency   . Otalgia, unspecified   . Iron deficiency anemia, unspecified   . Rheumatoid arthritis(714.0)     deveshewar  . Headache(784.0)     migraines and head pain  . Hx of varicella     As Child  . UTI (lower urinary tract infection) 11/28/2011    citrobacter       Past Surgical History  Procedure Laterality Date  . Tympanostomy tube placement  2009    Right    Family History  Problem Relation Age of Onset  . Dementia Father   . Stroke Father   . Hyperlipidemia Father   . Diabetes Father   . Parkinsonism Mother   .  Lymphoma      History   Social History  . Marital Status: Married    Spouse Name: N/A    Number of Children: N/A  . Years of Education: N/A   Social History Main Topics  . Smoking status: Never Smoker   . Smokeless tobacco: Never Used  . Alcohol Use: No  . Drug Use: No  . Sexual Activity: Not on file   Other Topics Concern  . Not on file   Social History Narrative   Regular Exercise-no   20 yrs in [place]   Son at [employer]   From [place].   Married HH  of 3    G1P1             Outpatient Encounter Prescriptions as of 06/06/2014  Medication Sig  . alendronate (FOSAMAX) 70 MG tablet once a week.   . Calcium Carb-Cholecalciferol (CALTRATE 600+D) 600-800 MG-UNIT TABS Take by mouth.  Marland Kitchen CARMOL 40 40 % CREA  APPLY  CREAM TOPICALLY EVERY DAY  . Cholecalciferol (VITAMIN D-3) 1000 UNITS CAPS Take by mouth.  . COUMADIN 2.5 MG tablet Take as directed by coumadin clinic.  . cycloSPORINE (RESTASIS) 0.05 % ophthalmic emulsion Place 1 drop into both eyes daily.    . folic acid (FOLVITE) 1 MG tablet Take 1 mg by mouth 2 (two) times daily.    . hydroxychloroquine (PLAQUENIL) 200 MG tablet Take 200 mg by mouth 2 (two) times daily.    . methotrexate (RHEUMATREX) 2.5 MG tablet Take 20 mg by mouth once a week.   . mineral/vitamin supplement (MULTIGEN) 70 MG TABS tablet TAKE ONE TABLET BY MOUTH DAILY.  Marland Kitchen NASCOBAL 500 MCG/0.1ML SOLN USE 50 MCG AS DIRECTED INTO THE NOSE.  . NON FORMULARY   . predniSONE (DELTASONE) 1 MG tablet daily. 1.5 mg daily  . SUMAtriptan (IMITREX) 50 MG tablet TAKE ONE TABLET BY MOUTH AS NEEDED-MAY REPEAT IN 2 TO 4 HOURS IF NEEDED  . XANAX 0.25 MG tablet TAKE ONE TABLET BY MOUTH AT BEDTIME AS NEEDED FOR SLEEP ,anxiety  . apixaban (ELIQUIS) 2.5 MG TABS tablet Take 1 tablet (2.5 mg total) by mouth 2 (two) times daily.  . [DISCONTINUED] methotrexate (RHEUMATREX) 15 MG tablet Take 15 mg by mouth once a week. Caution: Chemotherapy. Protect from light.    EXAM:  BP 106/76 mmHg  Temp(Src) 97.5 F (36.4 C) (Oral)  Ht 5\' 3"  (1.6 m)  Wt 152 lb (68.947 kg)  BMI 26.93 kg/m2  Body mass index is 26.93 kg/(m^2).  Physical Exam: Vital signs reviewed ACZ:YSAY is a well-developed well-nourished alert cooperative    who appearsr stated age in no acute distress.  HEENT: normocephalic atraumatic , Eyes: PERRL EOM's full, conjunctiva clear, Nares: paten,t no deformity discharge or tenderness., Ears: no deformity EAC's clear TMs with normal landmarks. Mouth: clear OP, no lesions, edema.  Moist mucous membranes. Dentition in adequate repair. NECK: supple without masses, thyromegaly or bruits. CHEST/PULM:  Clear to auscultation and percussion breath sounds equal no wheeze , rales or rhonchi. No chest wall  deformities or tenderness. CV: PMI is nondisplaced, S1 S2 no gallops, murmurs, rubs. Peripheral pulses are full without delay.No JVD .  ABDOMEN: Bowel sounds normal nontender  No guard or rebound, no hepato splenomegal no CVA tenderness.  Extremtities:  No clubbing cyanosis or edema, no acute joint swelling or redness left arm well healed scare  Some wrist deformity  no focal atrophy NEURO:  Oriented x3, cranial nerves 3-12 appear to be intact, no  obvious focal weakness,gait within normal limits no abnormal reflexes or asymmetrical SKIN: No acute rashes normal turgor, color, no bruising or petechiae. PSYCH: Oriented, good eye contact, no obvious depression, cognition and judgment appear ThemeLizard.no baseline anxiety  LN: no cervical axillary inguinal adenopathy  Lab Results  Component Value Date   WBC 8.5 05/30/2014   HGB 13.5 05/30/2014   HCT 42.0 05/30/2014   PLT 252.0 05/30/2014   GLUCOSE 89 05/30/2014   CHOL 197 05/30/2014   TRIG 121.0 05/30/2014   HDL 76.60 05/30/2014   LDLDIRECT 97.3 05/25/2013   LDLCALC 96 05/30/2014   ALT 15 05/30/2014   AST 18 05/30/2014   NA 137 05/30/2014   K 4.4 05/30/2014   CL 105 05/30/2014   CREATININE 0.7 05/30/2014   BUN 8 05/30/2014   CO2 28 05/30/2014   TSH 3.31 05/30/2014   INR 1.3 06/06/2014   BP Readings from Last 3 Encounters:  06/06/14 106/76  05/26/14 100/64  02/17/14 114/64  labs from here and dr D reviewed   ibc wasn't done   ASSESSMENT AND PLAN:  Discussed the following assessment and plan:  Visit for preventive health examination  Medication management  Adjustment disorder with anxiety  Chronic anticoagulation  Anxiety  Rheumatoid arthritis - Plan: Ambulatory referral to Endocrinology  Osteoporosis - Plan: Ambulatory referral to Endocrinology  Long term current use of systemic steroids  Elevated IOP, unspecified laterality  Patient Care Team: Burnis Medin, MD as PCP - General Bo Merino, MD  (Rheumatology) Mayme Genta, MD (Gastroenterology) Ascencion Dike, MD (Otolaryngology) Larey Seat, MD (Neurology) Rolm Bookbinder, MD (Dermatology) Ailene Rud, MD as Attending Physician (Urology) Maeola Sarah. Landry Mellow, MD as Consulting Physician (Obstetrics and Gynecology) Paralee Cancel, MD as Referring Physician (Hematology and Oncology) Patient Instructions   Ok to change  to eliquis as discussed . And stop the coumadin  There may be a PA and consider getting coupons on line .  Your insurance may prefer  Med such as xerlato instead .Marland Kitchen Increase vitamin D to 2000 iu per day Will plan   Referral to Dr. Gale Journey about osteoporosis.  Plan labs IBC VIT D varicella titer  cbcdiff  B12  In 6 months  And then  return office visit    Standley Brooking. Panosh M.D.

## 2014-06-06 NOTE — Patient Instructions (Addendum)
  Ok to change  to eliquis as discussed . And stop the coumadin  There may be a PA and consider getting coupons on line .  Your insurance may prefer  Med such as xerlato instead .Marland Kitchen Increase vitamin D to 2000 iu per day Will plan   Referral to Dr. Gale Journey about osteoporosis.  Plan labs IBC VIT D varicella titer  cbcdiff  B12  In 6 months  And then  return office visit

## 2014-06-07 ENCOUNTER — Ambulatory Visit (INDEPENDENT_AMBULATORY_CARE_PROVIDER_SITE_OTHER): Payer: BC Managed Care – PPO | Admitting: Psychology

## 2014-06-07 DIAGNOSIS — F411 Generalized anxiety disorder: Secondary | ICD-10-CM

## 2014-06-09 ENCOUNTER — Encounter: Payer: Self-pay | Admitting: Internal Medicine

## 2014-06-09 DIAGNOSIS — Z7952 Long term (current) use of systemic steroids: Secondary | ICD-10-CM | POA: Insufficient documentation

## 2014-06-09 DIAGNOSIS — Z789 Other specified health status: Secondary | ICD-10-CM | POA: Insufficient documentation

## 2014-06-09 DIAGNOSIS — H40059 Ocular hypertension, unspecified eye: Secondary | ICD-10-CM | POA: Insufficient documentation

## 2014-06-14 ENCOUNTER — Other Ambulatory Visit: Payer: Self-pay | Admitting: Obstetrics and Gynecology

## 2014-06-14 ENCOUNTER — Other Ambulatory Visit (HOSPITAL_COMMUNITY)
Admission: RE | Admit: 2014-06-14 | Discharge: 2014-06-14 | Disposition: A | Discharge status: Home / Self Care | Payer: BC Managed Care – PPO | Source: Ambulatory Visit | Attending: Obstetrics and Gynecology | Admitting: Obstetrics and Gynecology

## 2014-06-14 DIAGNOSIS — Z01419 Encounter for gynecological examination (general) (routine) without abnormal findings: Secondary | ICD-10-CM | POA: Diagnosis present

## 2014-06-15 ENCOUNTER — Other Ambulatory Visit: Payer: Self-pay | Admitting: Internal Medicine

## 2014-06-15 NOTE — Telephone Encounter (Signed)
Ok to refill x 3 

## 2014-06-16 LAB — CYTOLOGY - PAP

## 2014-06-16 NOTE — Telephone Encounter (Signed)
Called to the pharmacy and left on machine. 

## 2014-06-17 ENCOUNTER — Other Ambulatory Visit: Payer: Self-pay | Admitting: Internal Medicine

## 2014-06-19 ENCOUNTER — Ambulatory Visit (INDEPENDENT_AMBULATORY_CARE_PROVIDER_SITE_OTHER): Payer: BC Managed Care – PPO | Admitting: General Practice

## 2014-06-19 DIAGNOSIS — Z5181 Encounter for therapeutic drug level monitoring: Secondary | ICD-10-CM

## 2014-06-19 DIAGNOSIS — R760 Raised antibody titer: Secondary | ICD-10-CM

## 2014-06-19 DIAGNOSIS — R894 Abnormal immunological findings in specimens from other organs, systems and tissues: Secondary | ICD-10-CM

## 2014-06-19 LAB — POCT INR: INR: 1.6

## 2014-06-19 NOTE — Telephone Encounter (Signed)
Denied.  Filled on 06/16/14

## 2014-06-19 NOTE — Patient Instructions (Signed)
Anticoagulation Dose Instructions as of 06/19/2014      Dorene Grebe Tue Wed Thu Fri Sat   New Dose 2.5 mg 5 mg 5 mg 5 mg 2.5 mg 5 mg 5 mg    Description        Then continue taking 5 mg Monday, Tuesday, Wednesday, Friday and Saturday 5mg  (2 tabs). All other days, 2.5mg . Recheck in 2 week.

## 2014-06-20 NOTE — Progress Notes (Signed)
Pre visit review using our clinic review tool, if applicable. No additional management support is needed unless otherwise documented below in the visit note. 

## 2014-06-21 ENCOUNTER — Ambulatory Visit: Payer: BC Managed Care – PPO | Admitting: Psychology

## 2014-06-29 ENCOUNTER — Ambulatory Visit (INDEPENDENT_AMBULATORY_CARE_PROVIDER_SITE_OTHER): Payer: BC Managed Care – PPO | Admitting: Psychology

## 2014-06-29 DIAGNOSIS — F411 Generalized anxiety disorder: Secondary | ICD-10-CM

## 2014-06-30 ENCOUNTER — Encounter: Payer: Self-pay | Admitting: Internal Medicine

## 2014-07-03 ENCOUNTER — Ambulatory Visit (INDEPENDENT_AMBULATORY_CARE_PROVIDER_SITE_OTHER): Payer: BC Managed Care – PPO | Admitting: General Practice

## 2014-07-03 ENCOUNTER — Other Ambulatory Visit: Payer: Self-pay | Admitting: General Practice

## 2014-07-03 DIAGNOSIS — R894 Abnormal immunological findings in specimens from other organs, systems and tissues: Secondary | ICD-10-CM

## 2014-07-03 DIAGNOSIS — Z5181 Encounter for therapeutic drug level monitoring: Secondary | ICD-10-CM

## 2014-07-03 DIAGNOSIS — R760 Raised antibody titer: Secondary | ICD-10-CM

## 2014-07-03 LAB — POCT INR: INR: 1.8

## 2014-07-03 MED ORDER — WARFARIN SODIUM 2.5 MG PO TABS: ORAL_TABLET | ORAL | Status: DC

## 2014-07-03 NOTE — Progress Notes (Signed)
Pre visit review using our clinic review tool, if applicable. No additional management support is needed unless otherwise documented below in the visit note. 

## 2014-07-05 ENCOUNTER — Ambulatory Visit: Payer: BC Managed Care – PPO | Admitting: Psychology

## 2014-07-19 ENCOUNTER — Ambulatory Visit (INDEPENDENT_AMBULATORY_CARE_PROVIDER_SITE_OTHER): Payer: BC Managed Care – PPO | Admitting: Psychology

## 2014-07-19 DIAGNOSIS — F411 Generalized anxiety disorder: Secondary | ICD-10-CM

## 2014-07-20 ENCOUNTER — Ambulatory Visit (INDEPENDENT_AMBULATORY_CARE_PROVIDER_SITE_OTHER): Payer: BC Managed Care – PPO | Admitting: General Practice

## 2014-07-20 DIAGNOSIS — R760 Raised antibody titer: Secondary | ICD-10-CM

## 2014-07-20 DIAGNOSIS — Z5181 Encounter for therapeutic drug level monitoring: Secondary | ICD-10-CM

## 2014-07-20 DIAGNOSIS — R894 Abnormal immunological findings in specimens from other organs, systems and tissues: Secondary | ICD-10-CM

## 2014-07-20 LAB — POCT INR: INR: 1.6

## 2014-07-20 NOTE — Progress Notes (Signed)
Pre visit review using our clinic review tool, if applicable. No additional management support is needed unless otherwise documented below in the visit note. 

## 2014-08-02 ENCOUNTER — Ambulatory Visit: Payer: BC Managed Care – PPO | Admitting: Psychology

## 2014-08-10 ENCOUNTER — Other Ambulatory Visit: Payer: Self-pay | Admitting: General Practice

## 2014-08-10 MED ORDER — WARFARIN SODIUM 2.5 MG PO TABS: ORAL_TABLET | ORAL | Status: DC

## 2014-08-16 ENCOUNTER — Ambulatory Visit (INDEPENDENT_AMBULATORY_CARE_PROVIDER_SITE_OTHER): Payer: BC Managed Care – PPO | Admitting: Psychology

## 2014-08-16 DIAGNOSIS — F411 Generalized anxiety disorder: Secondary | ICD-10-CM | POA: Diagnosis not present

## 2014-08-17 ENCOUNTER — Ambulatory Visit (INDEPENDENT_AMBULATORY_CARE_PROVIDER_SITE_OTHER): Payer: BC Managed Care – PPO | Admitting: General Practice

## 2014-08-17 DIAGNOSIS — R894 Abnormal immunological findings in specimens from other organs, systems and tissues: Secondary | ICD-10-CM

## 2014-08-17 DIAGNOSIS — Z5181 Encounter for therapeutic drug level monitoring: Secondary | ICD-10-CM | POA: Diagnosis not present

## 2014-08-17 DIAGNOSIS — R760 Raised antibody titer: Secondary | ICD-10-CM

## 2014-08-17 LAB — POCT INR: INR: 1.5

## 2014-08-17 NOTE — Progress Notes (Signed)
Pre visit review using our clinic review tool, if applicable. No additional management support is needed unless otherwise documented below in the visit note. 

## 2014-08-30 ENCOUNTER — Ambulatory Visit (INDEPENDENT_AMBULATORY_CARE_PROVIDER_SITE_OTHER): Payer: BC Managed Care – PPO | Admitting: Psychology

## 2014-08-30 DIAGNOSIS — F411 Generalized anxiety disorder: Secondary | ICD-10-CM | POA: Diagnosis not present

## 2014-09-12 ENCOUNTER — Ambulatory Visit (INDEPENDENT_AMBULATORY_CARE_PROVIDER_SITE_OTHER): Payer: BC Managed Care – PPO | Admitting: Psychology

## 2014-09-12 DIAGNOSIS — F411 Generalized anxiety disorder: Secondary | ICD-10-CM | POA: Diagnosis not present

## 2014-09-13 ENCOUNTER — Ambulatory Visit: Payer: BC Managed Care – PPO | Admitting: Psychology

## 2014-09-14 ENCOUNTER — Ambulatory Visit (INDEPENDENT_AMBULATORY_CARE_PROVIDER_SITE_OTHER): Payer: BC Managed Care – PPO | Admitting: General Practice

## 2014-09-14 DIAGNOSIS — R894 Abnormal immunological findings in specimens from other organs, systems and tissues: Secondary | ICD-10-CM

## 2014-09-14 DIAGNOSIS — R760 Raised antibody titer: Secondary | ICD-10-CM

## 2014-09-14 DIAGNOSIS — Z5181 Encounter for therapeutic drug level monitoring: Secondary | ICD-10-CM | POA: Diagnosis not present

## 2014-09-14 LAB — POCT INR: INR: 1.5

## 2014-09-14 NOTE — Progress Notes (Signed)
Pre visit review using our clinic review tool, if applicable. No additional management support is needed unless otherwise documented below in the visit note. 

## 2014-09-28 ENCOUNTER — Ambulatory Visit (INDEPENDENT_AMBULATORY_CARE_PROVIDER_SITE_OTHER): Payer: BC Managed Care – PPO | Admitting: Psychology

## 2014-09-28 DIAGNOSIS — F411 Generalized anxiety disorder: Secondary | ICD-10-CM

## 2014-10-12 ENCOUNTER — Ambulatory Visit (INDEPENDENT_AMBULATORY_CARE_PROVIDER_SITE_OTHER): Payer: BC Managed Care – PPO | Admitting: General Practice

## 2014-10-12 DIAGNOSIS — R894 Abnormal immunological findings in specimens from other organs, systems and tissues: Secondary | ICD-10-CM

## 2014-10-12 DIAGNOSIS — Z5181 Encounter for therapeutic drug level monitoring: Secondary | ICD-10-CM

## 2014-10-12 DIAGNOSIS — R760 Raised antibody titer: Secondary | ICD-10-CM

## 2014-10-12 LAB — POCT INR: INR: 1.8

## 2014-10-12 NOTE — Progress Notes (Signed)
Pre visit review using our clinic review tool, if applicable. No additional management support is needed unless otherwise documented below in the visit note. 

## 2014-10-26 ENCOUNTER — Ambulatory Visit (INDEPENDENT_AMBULATORY_CARE_PROVIDER_SITE_OTHER): Payer: BC Managed Care – PPO | Admitting: Psychology

## 2014-10-26 ENCOUNTER — Ambulatory Visit: Payer: BC Managed Care – PPO | Admitting: Psychology

## 2014-10-26 DIAGNOSIS — F411 Generalized anxiety disorder: Secondary | ICD-10-CM

## 2014-11-09 ENCOUNTER — Ambulatory Visit (INDEPENDENT_AMBULATORY_CARE_PROVIDER_SITE_OTHER): Payer: BC Managed Care – PPO | Admitting: General Practice

## 2014-11-09 DIAGNOSIS — R760 Raised antibody titer: Secondary | ICD-10-CM

## 2014-11-09 DIAGNOSIS — Z5181 Encounter for therapeutic drug level monitoring: Secondary | ICD-10-CM

## 2014-11-09 LAB — POCT INR: INR: 1.5

## 2014-11-09 NOTE — Progress Notes (Signed)
Pre visit review using our clinic review tool, if applicable. No additional management support is needed unless otherwise documented below in the visit note. 

## 2014-11-11 ENCOUNTER — Ambulatory Visit (INDEPENDENT_AMBULATORY_CARE_PROVIDER_SITE_OTHER): Payer: BC Managed Care – PPO | Admitting: Internal Medicine

## 2014-11-11 ENCOUNTER — Encounter: Payer: Self-pay | Admitting: Internal Medicine

## 2014-11-11 ENCOUNTER — Other Ambulatory Visit: Payer: Self-pay | Admitting: Internal Medicine

## 2014-11-11 VITALS — BP 108/80 | HR 66 | Temp 98.2°F | Ht 63.0 in | Wt 157.0 lb

## 2014-11-11 DIAGNOSIS — Z79899 Other long term (current) drug therapy: Secondary | ICD-10-CM

## 2014-11-11 DIAGNOSIS — J029 Acute pharyngitis, unspecified: Secondary | ICD-10-CM

## 2014-11-11 DIAGNOSIS — M069 Rheumatoid arthritis, unspecified: Secondary | ICD-10-CM | POA: Diagnosis not present

## 2014-11-11 NOTE — Progress Notes (Signed)
Pre visit review using our clinic review tool, if applicable. No additional management support is needed unless otherwise documented below in the visit note.   Chief Complaint  Patient presents with  . Sore Throat    cough, hoarseness, runny nose, burning of throat     HPI: Patient comes in today for [facility] Saturday clinic for  new problem evaluation. 2-3 days of scratchy and now burning throat that goes down  No dysphagia sob at this time . No fever  Minimal to no cough  Husband went to urgent care   In [place]   Given nystatin  For poss yeast infection  But then got  Then  Got flu symptoms  .     ? Fever 98  Some cough  And hoarse  Worries about transmitting to son.  ROS: See pertinent positives and negatives per HPI. No cp hemoptysis no strep exposure    Past Medical History  Diagnosis Date  . Chest pain, unspecified   . Palpitations   . Personal history of venous thrombosis and embolism   . Chronic anticoagulation   . Adjustment disorder with anxiety   . Other and unspecified hyperlipidemia   . B12 deficiency due to diet     is a vegetarian  . Unspecified vitamin D deficiency   . Otalgia, unspecified   . Iron deficiency anemia, unspecified   . Rheumatoid arthritis(714.0)     deveshewar  . Headache(784.0)     migraines and head pain  . Hx of varicella     As Child  . UTI (lower urinary tract infection) 11/28/2011    citrobacter       Family History  Problem Relation Age of Onset  . Dementia Father   . Stroke Father   . Hyperlipidemia Father   . Diabetes Father   . Parkinsonism Mother   . Lymphoma      History   Social History  . Marital Status: Married    Spouse Name: N/A  . Number of Children: N/A  . Years of Education: N/A   Social History Main Topics  . Smoking status: Never Smoker   . Smokeless tobacco: Never Used  . Alcohol Use: No  . Drug Use: No  . Sexual Activity: Not on file   Other Topics Concern  . None   Social History Narrative   Regular Exercise-no   20 yrs in [place]   Son at [facility]   From [place].   Married HH  of 3    G1P1             Outpatient Prescriptions Prior to Visit  Medication Sig Dispense Refill  . alendronate (FOSAMAX) 70 MG tablet once a week.     . Calcium Carb-Cholecalciferol (CALTRATE 600+D) 600-800 MG-UNIT TABS Take by mouth.    Marland Kitchen CARMOL 40 40 % CREA APPLY  CREAM TOPICALLY EVERY DAY 29 each 1  . Cholecalciferol (VITAMIN D-3) 1000 UNITS CAPS Take by mouth.    . cycloSPORINE (RESTASIS) 0.05 % ophthalmic emulsion Place 1 drop into both eyes daily.      . folic acid (FOLVITE) 1 MG tablet Take 1 mg by mouth 2 (two) times daily.      . hydroxychloroquine (PLAQUENIL) 200 MG tablet Take 200 mg by mouth 2 (two) times daily.      . methotrexate (RHEUMATREX) 2.5 MG tablet Take 20 mg by mouth once a week.     . mineral/vitamin supplement (MULTIGEN) 70 MG TABS tablet TAKE ONE  TABLET BY MOUTH DAILY. 30 each 5  . NASCOBAL 500 MCG/0.1ML SOLN USE 50 MCG AS DIRECTED INTO THE NOSE. 2 Bottle 2  . NON FORMULARY     . predniSONE (DELTASONE) 1 MG tablet daily. 1.5 mg daily    . SUMAtriptan (IMITREX) 50 MG tablet TAKE ONE TABLET BY MOUTH AS NEEDED-MAY REPEAT IN 2 TO 4 HOURS IF NEEDED 9 tablet 0  . warfarin (COUMADIN) 2.5 MG tablet Take as directed by coumadin clinic 50 tablet 3  . XANAX 0.25 MG tablet TAKE ONE TABLET BY MOUTH AT BEDTIME AS NEEDED FOR ANXIETY AND FOR SLEEP 60 tablet 2   No facility-administered medications prior to visit.     EXAM:  BP 108/80 mmHg  Pulse 66  Temp(Src) 98.2 F (36.8 C) (Oral)  Ht 5\' 3"  (1.6 m)  Wt 157 lb (71.215 kg)  BMI 27.82 kg/m2  SpO2 98%  Body mass index is 27.82 kg/(m^2).  GENERAL: vitals reviewed and listed above, alert, oriented, appears well hydrated and in no acute distress minimally hoarse .  HEENT: atraumatic, conjunctiva  clear, no obvious abnormalities on inspection of external nose and ears tms nl OP : no lesion edema or exudate  1+ red cobblestoning  No  stridor nl speech  NECK: no obvious masses on inspection palpation  But holds throat  LUNGS: clear to auscultation bilaterally, no wheezes, rales or rhonchi, good air movement CV: HRRR, no clubbing cyanosis or  peripheral edema nl cap refill  MS: moves all extremities without noticeable focal  abnormality PSYCH: pleasant and cooperative, baseline anxiety  Has a list  Of ?s   ASSESSMENT AND PLAN:  Discussed the following assessment and plan:  Acute pharyngitis, unspecified pharyngitis type  Sore throat - Plan: POCT rapid strep A, Throat culture  High risk medication use - ok to hold the mtx and plaquenil at this time until improving   Rheumatoid arthritis Seems viral but context and exam.  Expectant management. Close observation cause of high risk situation and meds.  Can f/u in office next week if needed and with alarm features. -Patient advised to return or notify health care team  if symptoms worsen ,persist or new concerns arise. ?s answered as beset possible Total visit 38mins > 50% spent counseling and coordinating care as indicated in above note and in instructions to patient .   Patient Instructions  Rapid strep tests is negative and  This acts  Like a viral respiratory infection . And antibiotics wont help.   Push fluids  Comfort measures . I don't see evidence of bacterial infection . would will probably develop a cough that will last another 1-2 weeks .  Can use good handwashing and masks to prevent spread to others until  Improved .  Standley Brooking. Panosh M.D.

## 2014-11-11 NOTE — Patient Instructions (Signed)
Rapid strep tests is negative and  This acts  Like a viral respiratory infection . And antibiotics wont help.   Push fluids  Comfort measures . I don't see evidence of bacterial infection . would will probably develop a cough that will last another 1-2 weeks .  Can use good handwashing and masks to prevent spread to others until  Improved .

## 2014-11-12 LAB — CULTURE, GROUP A STREP: Organism ID, Bacteria: NORMAL

## 2014-11-13 ENCOUNTER — Telehealth: Payer: Self-pay

## 2014-11-13 NOTE — Telephone Encounter (Signed)
PLEASE NOTE: All timestamps contained within this report are represented as [place] Standard Time. CONFIDENTIALTY NOTICE: This fax transmission is intended only for the addressee. It contains information that is legally privileged, confidential or otherwise protected from use or disclosure. If you are not the intended recipient, you are strictly prohibited from reviewing, disclosing, copying using or disseminating any of this information or taking any action in reliance on or regarding this information. If you have received this fax in error, please notify us immediately by telephone so that we can arrange for its return to [place]. Phone: [phone], Toll-Free: [phone], Fax: [phone] Page: 1 of 2 Call Id: [id number] Rosman Patient Name: [name] Melville Gender: Female DOB: 21-Feb-1961 Age: 54 Y 48 M 13 D Return Phone Number: [phone] (Primary), [phone] (Secondary) Address[place] [address] Client [facility] - Client Client Site [facility] Night Contact Type Call Call Type Triage / Clinical Relationship To Patient Self Return Phone Number [phone] (Primary) Chief Complaint Sore Throat Initial Comment Caller states, wants to make an appt for the Sat clinic, very sore throat, cough, other health issues, concerned about waiting for an appt PreDisposition InappropriateToAsk Nurse Assessment Nurse: Evangeline Gula, RN, Jenny Reichmann Date/Time (Eastern Time): 11/11/2014 9:42:05 AM Confirm and document reason for call. If symptomatic, describe symptoms. ---Caller states, wants to make an appt for the Sat clinic, very sore throat, cough, other health issues, concerned about waiting for an appt Has the patient traveled out of the country within the last 30 days? ---No Does the patient require triage? ---Yes Related visit to physician within  the last 2 weeks? ---No Does the PT have any chronic conditions? (i.e. diabetes, asthma, etc.) ---Yes List chronic conditions. ---immun system surpressed Guidelines Guideline Title Affirmed Question Affirmed Notes Nurse Date/Time Eilene Ghazi Time) Sore Throat SEVERE (e.g., excruciating) throat pain Evangeline Gula, RN, Jenny Reichmann 11/11/2014 9:44:42 AM Disp. Time Eilene Ghazi Time) Disposition Final User 11/11/2014 9:48:02 AM See Physician within 24 Hours Yes Evangeline Gula, RN, [place] Understands: Yes Disagree/Comply: Comply Care Advice Given Per Guideline PLEASE NOTE: All timestamps contained within this report are represented as [place] Standard Time. CONFIDENTIALTY NOTICE: This fax transmission is intended only for the addressee. It contains information that is legally privileged, confidential or otherwise protected from use or disclosure. If you are not the intended recipient, you are strictly prohibited from reviewing, disclosing, copying using or disseminating any of this information or taking any action in reliance on or regarding this information. If you have received this fax in error, please notify us immediately by telephone so that we can arrange for its return to [place]. Phone: [phone], Toll-Free: [phone], Fax: [phone] Page: 2 of 2 Call Id: [id number] Care Advice Given Per Guideline SEE PHYSICIAN WITHIN 24 HOURS: * IF OFFICE WILL BE OPEN: You need to be examined within the next 24 hours. Call your doctor when the office opens, and make an appointment. Unable to complete care advice due to caller ending call to make an appt at the [facility] office. After Care Instructions Given Call Event Type User Date / Time Description Referrals [facility]

## 2014-11-13 NOTE — Telephone Encounter (Signed)
Pt was seen in [facility] 06.25.2016

## 2014-11-14 ENCOUNTER — Ambulatory Visit (INDEPENDENT_AMBULATORY_CARE_PROVIDER_SITE_OTHER): Payer: BC Managed Care – PPO | Admitting: Psychology

## 2014-11-14 DIAGNOSIS — F411 Generalized anxiety disorder: Secondary | ICD-10-CM

## 2014-11-15 ENCOUNTER — Other Ambulatory Visit: Payer: BC Managed Care – PPO

## 2014-11-22 ENCOUNTER — Other Ambulatory Visit (INDEPENDENT_AMBULATORY_CARE_PROVIDER_SITE_OTHER): Payer: BC Managed Care – PPO

## 2014-11-22 ENCOUNTER — Other Ambulatory Visit: Payer: Self-pay | Admitting: Family Medicine

## 2014-11-22 DIAGNOSIS — E559 Vitamin D deficiency, unspecified: Secondary | ICD-10-CM | POA: Diagnosis not present

## 2014-11-22 DIAGNOSIS — Z79899 Other long term (current) drug therapy: Secondary | ICD-10-CM | POA: Diagnosis not present

## 2014-11-22 DIAGNOSIS — Z0184 Encounter for antibody response examination: Secondary | ICD-10-CM

## 2014-11-22 DIAGNOSIS — E538 Deficiency of other specified B group vitamins: Secondary | ICD-10-CM | POA: Diagnosis not present

## 2014-11-22 DIAGNOSIS — E611 Iron deficiency: Secondary | ICD-10-CM

## 2014-11-22 LAB — CBC WITH DIFFERENTIAL/PLATELET
Basophils Absolute: 0 10*3/uL (ref 0.0–0.1)
Basophils Relative: 0.5 % (ref 0.0–3.0)
EOS PCT: 1.3 % (ref 0.0–5.0)
Eosinophils Absolute: 0.1 10*3/uL (ref 0.0–0.7)
HEMATOCRIT: 43 % (ref 36.0–46.0)
HEMOGLOBIN: 14 g/dL (ref 12.0–15.0)
LYMPHS ABS: 3 10*3/uL (ref 0.7–4.0)
Lymphocytes Relative: 37.7 % (ref 12.0–46.0)
MCHC: 32.5 g/dL (ref 30.0–36.0)
MCV: 85.9 fl (ref 78.0–100.0)
MONOS PCT: 7.2 % (ref 3.0–12.0)
Monocytes Absolute: 0.6 10*3/uL (ref 0.1–1.0)
NEUTROS ABS: 4.2 10*3/uL (ref 1.4–7.7)
Neutrophils Relative %: 53.3 % (ref 43.0–77.0)
Platelets: 250 10*3/uL (ref 150.0–400.0)
RBC: 5.01 Mil/uL (ref 3.87–5.11)
RDW: 13.1 % (ref 11.5–15.5)
WBC: 7.9 10*3/uL (ref 4.0–10.5)

## 2014-11-22 LAB — VITAMIN B12: VITAMIN B 12: 517 pg/mL (ref 211–911)

## 2014-11-22 LAB — IBC PANEL
Iron: 90 ug/dL (ref 42–145)
Saturation Ratios: 28.2 % (ref 20.0–50.0)
Transferrin: 228 mg/dL (ref 212.0–360.0)

## 2014-11-22 LAB — VITAMIN D 25 HYDROXY (VIT D DEFICIENCY, FRACTURES): VITD: 34.91 ng/mL (ref 30.00–100.00)

## 2014-11-23 ENCOUNTER — Ambulatory Visit: Payer: BC Managed Care – PPO | Admitting: Psychology

## 2014-11-23 LAB — VARICELLA ZOSTER ANTIBODY, IGG: Varicella IgG: >4000 Index — ABNORMAL HIGH (ref <135.00)

## 2014-11-27 ENCOUNTER — Ambulatory Visit (INDEPENDENT_AMBULATORY_CARE_PROVIDER_SITE_OTHER): Payer: BC Managed Care – PPO | Admitting: Internal Medicine

## 2014-11-27 ENCOUNTER — Encounter: Payer: Self-pay | Admitting: Internal Medicine

## 2014-11-27 VITALS — BP 106/68 | Temp 98.1°F | Ht 63.0 in | Wt 158.5 lb

## 2014-11-27 DIAGNOSIS — E538 Deficiency of other specified B group vitamins: Secondary | ICD-10-CM

## 2014-11-27 DIAGNOSIS — Z789 Other specified health status: Secondary | ICD-10-CM

## 2014-11-27 DIAGNOSIS — M25642 Stiffness of left hand, not elsewhere classified: Secondary | ICD-10-CM | POA: Insufficient documentation

## 2014-11-27 DIAGNOSIS — Z7901 Long term (current) use of anticoagulants: Secondary | ICD-10-CM | POA: Diagnosis not present

## 2014-11-27 DIAGNOSIS — M069 Rheumatoid arthritis, unspecified: Secondary | ICD-10-CM

## 2014-11-27 DIAGNOSIS — S90859S Superficial foreign body, unspecified foot, sequela: Secondary | ICD-10-CM

## 2014-11-27 DIAGNOSIS — Z87898 Personal history of other specified conditions: Secondary | ICD-10-CM

## 2014-11-27 DIAGNOSIS — E611 Iron deficiency: Secondary | ICD-10-CM

## 2014-11-27 DIAGNOSIS — Z79899 Other long term (current) drug therapy: Secondary | ICD-10-CM

## 2014-11-27 DIAGNOSIS — F419 Anxiety disorder, unspecified: Secondary | ICD-10-CM

## 2014-11-27 MED ORDER — XANAX 0.25 MG PO TABS: ORAL_TABLET | ORAL | Status: DC

## 2014-11-27 NOTE — Patient Instructions (Signed)
Ok to reconsider eliquis...    Stop  Coumadin. To get  PT below 2 and then begin medication Check ingredients .  On the vitamins   To help advise you.  You are eligible for  Zostavax .    Agree with proceeding   With more opinion and evaluation   .     Hand left .   Since  It functions less well now.   Wellness visit  In 6 months  Lab and b12 and IBC included.  Uncertain what  How long fb in foot .  Can get opinoin about   This from ortho but could have been there a long time.

## 2014-11-27 NOTE — Progress Notes (Signed)
Pre visit review using our clinic review tool, if applicable. No additional management support is needed unless otherwise documented below in the visit note.  Chief Complaint  Patient presents with  . Follow-up    iron defi b12  left hand not working well    HPI: [name] 54 y.o.  comes in for chronic disease/ medication management  Since her last visit her sore throat is pretty much gone. She did have blood tests done including immunity status her varicella.  She is most concerned with the function decreasing in her left hand with gripping and range of motion. She states that after her surgery for her fracture she had better hand usage and movement than she does now. She went back to her hand surgeon and felt that she did not get satisfactory answers is very worried about this she also saw Dr. her rheumatologist question of having some osteoarthritis also in that hand. She's gotten little bumps at the distal joints.  She asked whether she should get another opinion go to [facility] etc. not sure what to do but is distressed that she has decreased function in that her dominant hand. She tried to get a second opinion from Dr. Amedeo Plenty  but he did not accept her as a patient. She did see Dr. Apolonio Schneiders who had a suggestion of immobilization in the short-term.  She also noted to have an incidental finding of a foreign body on x-ray from heel no pain left foot may have had a needle injury when she was a small child sewing needle. This is never bothered her.  She still on anticoagulation low level Coumadin. Has not started the Eliquis  with but is still considering it had some questions. ROS: See pertinent positives and negatives per HPI.  He also asked for refill the Xanax she takes one a day.  Past Medical History  Diagnosis Date  . Chest pain, unspecified   . Palpitations   . Personal history of venous thrombosis and embolism   . Chronic anticoagulation   . Adjustment disorder with anxiety    . Other and unspecified hyperlipidemia   . B12 deficiency due to diet     is a vegetarian  . Unspecified vitamin D deficiency   . Otalgia, unspecified   . Iron deficiency anemia, unspecified   . Rheumatoid arthritis(714.0)     deveshewar  . Headache(784.0)     migraines and head pain  . Hx of varicella     As Child  . UTI (lower urinary tract infection) 11/28/2011    citrobacter       Family History  Problem Relation Age of Onset  . Dementia Father   . Stroke Father   . Hyperlipidemia Father   . Diabetes Father   . Parkinsonism Mother   . Lymphoma      History   Social History  . Marital Status: Married    Spouse Name: N/A  . Number of Children: N/A  . Years of Education: N/A   Social History Main Topics  . Smoking status: Never Smoker   . Smokeless tobacco: Never Used  . Alcohol Use: No  . Drug Use: No  . Sexual Activity: Not on file   Other Topics Concern  . None   Social History Narrative   Regular Exercise-no   20 yrs in [place]   Son at [facility] [employer] [facility]   From [place].   Married [place]  of 3    G1P1  Outpatient Prescriptions Prior to Visit  Medication Sig Dispense Refill  . alendronate (FOSAMAX) 70 MG tablet once a week.     . Calcium Carb-Cholecalciferol (CALTRATE 600+D) 600-800 MG-UNIT TABS Take by mouth.    Marland Kitchen CARMOL 40 40 % CREA APPLY  CREAM TOPICALLY EVERY DAY 29 each 1  . cycloSPORINE (RESTASIS) 0.05 % ophthalmic emulsion Place 1 drop into both eyes daily.      . folic acid (FOLVITE) 1 MG tablet Take 1 mg by mouth 2 (two) times daily.      . hydroxychloroquine (PLAQUENIL) 200 MG tablet Take 200 mg by mouth 2 (two) times daily.      . methotrexate (RHEUMATREX) 2.5 MG tablet Take 20 mg by mouth once a week.     . mineral/vitamin supplement (MULTIGEN) 70 MG TABS tablet TAKE ONE TABLET BY MOUTH DAILY. 30 each 5  . NASCOBAL 500 MCG/0.1ML SOLN USE 50 MCG AS DIRECTED INTO THE NOSE. 2 Bottle 2  . NON FORMULARY     . predniSONE (DELTASONE) 1 MG tablet  daily. 1.5 mg daily    . SUMAtriptan (IMITREX) 50 MG tablet TAKE ONE TABLET BY MOUTH AS NEEDED-MAY REPEAT IN 2 TO 4 HOURS IF NEEDED 9 tablet 0  . TRAVATAN Z 0.004 % SOLN ophthalmic solution     . warfarin (COUMADIN) 2.5 MG tablet Take as directed by coumadin clinic 50 tablet 3  . XANAX 0.25 MG tablet TAKE ONE TABLET BY MOUTH AT BEDTIME AS NEEDED FOR ANXIETY AND FOR SLEEP 60 tablet 2  . Cholecalciferol (VITAMIN D-3) 1000 UNITS CAPS Take by mouth.     No facility-administered medications prior to visit.     EXAM:  BP 106/68 mmHg  Temp(Src) 98.1 F (36.7 C) (Oral)  Ht 5\' 3"  (1.6 m)  Wt 158 lb 8 oz (71.895 kg)  BMI 28.08 kg/m2  Body mass index is 28.08 kg/(m^2).  GENERAL: vitals reviewed and listed above, alert, oriented, appears well hydrated and in no acute distress somewhat distressed about difficulty with her left hand but she keeps trying to do range of motion gripping during the interview HEENT: atraumatic, conjunctiva  clear, no obvious abnormalities on inspection of external nose and ears NECK: no obvious masses on inspection palpation  MS: moves all extremities  well healed scar on the left forearm. Left hand slightly more swollen fingers and the right there is some distal nodes question mucinous cyst. Only on the left hand. No redness digits looks slightly swollen. Ears no redness or warmth anywhere. Pulses are intact. PSYCH: pleasant and cooperative,  has mild to moderate anxiety Lab Results  Component Value Date   WBC 7.9 11/22/2014   HGB 14.0 11/22/2014   HCT 43.0 11/22/2014   PLT 250.0 11/22/2014   GLUCOSE 89 05/30/2014   CHOL 197 05/30/2014   TRIG 121.0 05/30/2014   HDL 76.60 05/30/2014   LDLDIRECT 97.3 05/25/2013   LDLCALC 96 05/30/2014   ALT 15 05/30/2014   AST 18 05/30/2014   NA 137 05/30/2014   K 4.4 05/30/2014   CL 105 05/30/2014   CREATININE 0.7 05/30/2014   BUN 8 05/30/2014   CO2 28 05/30/2014   TSH 3.31 05/30/2014   INR 1.5 11/09/2014    Lab  Results  Component Value Date   VITAMINB12 517 11/22/2014      ASSESSMENT AND PLAN:  Discussed the following assessment and plan:  Stiffness of left hand joint - ccurred after initial improvement surgery  pt very concerned about lof  had been better after sufery ? if OA orther causes disc other opinion etc   Medically complex patient  Chronic anticoagulation - revewied risk benefit of direct inhibitors vs coumadin and how to change  Iron deficiency - stable repleted follow  Medication management  B12 deficiency - in adequate range  Rheumatoid arthritis  Anxiety - cont on once a day alprazalam for now pt awre risk benefot   Immune to varicella - disc zostavax  Foreign body in heel, unspecified laterality, sequela - no sx found inidentally  looks like metal needle? no sx follow  could get more imaging ortho but at this time  no intervention Disc about left UE predicament and she is very worried  Losing the function of her dominant hand   Disc options ie  Hand PT OT  Other ideas     Plan  6 months PV  -Patient advised to return or notify health care team  if symptoms worsen or new concerns arise. Prolonged face to face time   Total visit 33mins > 50% spent counseling and coordinating care as indicated in above note and in instructions to patient .   Patient Instructions  Ok to reconsider eliquis...    Stop  Coumadin. To get  PT below 2 and then begin medication Check ingredients .  On the vitamins   To help advise you.  You are eligible for  Zostavax .    Agree with proceeding   With more opinion and evaluation   .     Hand left .   Since  It functions less well now.   Wellness visit  In 6 months  Lab and b12 and IBC included.  Uncertain what  How long fb in foot .  Can get opinoin about   This from ortho but could have been there a long time.              Standley Brooking. Christipher Rieger M.D.  Total visit 49mins > 50% spent counseling and coordinating care as indicated in above note  and in instructions to patient .

## 2014-11-28 LAB — HM DEXA SCAN

## 2014-11-30 ENCOUNTER — Other Ambulatory Visit: Payer: Self-pay | Admitting: General Practice

## 2014-11-30 MED ORDER — WARFARIN SODIUM 2.5 MG PO TABS: ORAL_TABLET | ORAL | Status: DC

## 2014-12-11 ENCOUNTER — Ambulatory Visit (INDEPENDENT_AMBULATORY_CARE_PROVIDER_SITE_OTHER): Payer: BC Managed Care – PPO | Admitting: General Practice

## 2014-12-11 ENCOUNTER — Encounter: Payer: Self-pay | Admitting: Family Medicine

## 2014-12-11 ENCOUNTER — Ambulatory Visit (INDEPENDENT_AMBULATORY_CARE_PROVIDER_SITE_OTHER): Payer: BC Managed Care – PPO | Admitting: Psychology

## 2014-12-11 DIAGNOSIS — R894 Abnormal immunological findings in specimens from other organs, systems and tissues: Secondary | ICD-10-CM

## 2014-12-11 DIAGNOSIS — Z5181 Encounter for therapeutic drug level monitoring: Secondary | ICD-10-CM

## 2014-12-11 DIAGNOSIS — R760 Raised antibody titer: Secondary | ICD-10-CM

## 2014-12-11 DIAGNOSIS — F411 Generalized anxiety disorder: Secondary | ICD-10-CM | POA: Diagnosis not present

## 2014-12-11 LAB — POCT INR: INR: 2.1

## 2014-12-11 NOTE — Progress Notes (Signed)
Pre visit review using our clinic review tool, if applicable. No additional management support is needed unless otherwise documented below in the visit note. 

## 2014-12-21 ENCOUNTER — Encounter: Payer: Self-pay | Admitting: Family Medicine

## 2014-12-21 ENCOUNTER — Ambulatory Visit: Payer: BC Managed Care – PPO | Admitting: Psychology

## 2014-12-27 ENCOUNTER — Other Ambulatory Visit: Payer: Self-pay | Admitting: Internal Medicine

## 2014-12-27 NOTE — Telephone Encounter (Signed)
Sent to the pharmacy by e-scribe. 

## 2014-12-30 ENCOUNTER — Other Ambulatory Visit: Payer: Self-pay | Admitting: Internal Medicine

## 2015-01-01 NOTE — Telephone Encounter (Signed)
Sent to the pharmacy by e-scribe.  Last filled 03/2014

## 2015-01-04 ENCOUNTER — Ambulatory Visit: Payer: BC Managed Care – PPO | Admitting: Psychology

## 2015-01-08 ENCOUNTER — Ambulatory Visit (INDEPENDENT_AMBULATORY_CARE_PROVIDER_SITE_OTHER): Payer: BC Managed Care – PPO | Admitting: General Practice

## 2015-01-08 DIAGNOSIS — R760 Raised antibody titer: Secondary | ICD-10-CM

## 2015-01-08 DIAGNOSIS — Z5181 Encounter for therapeutic drug level monitoring: Secondary | ICD-10-CM

## 2015-01-08 DIAGNOSIS — R894 Abnormal immunological findings in specimens from other organs, systems and tissues: Secondary | ICD-10-CM

## 2015-01-08 LAB — POCT INR: INR: 1.6

## 2015-01-08 NOTE — Progress Notes (Signed)
Pre visit review using our clinic review tool, if applicable. No additional management support is needed unless otherwise documented below in the visit note. 

## 2015-01-18 ENCOUNTER — Ambulatory Visit (INDEPENDENT_AMBULATORY_CARE_PROVIDER_SITE_OTHER): Payer: BC Managed Care – PPO | Admitting: Psychology

## 2015-01-18 DIAGNOSIS — F411 Generalized anxiety disorder: Secondary | ICD-10-CM | POA: Diagnosis not present

## 2015-02-03 ENCOUNTER — Other Ambulatory Visit: Payer: Self-pay | Admitting: Internal Medicine

## 2015-02-05 ENCOUNTER — Ambulatory Visit (INDEPENDENT_AMBULATORY_CARE_PROVIDER_SITE_OTHER): Payer: BC Managed Care – PPO | Admitting: General Practice

## 2015-02-05 DIAGNOSIS — R760 Raised antibody titer: Secondary | ICD-10-CM

## 2015-02-05 DIAGNOSIS — R894 Abnormal immunological findings in specimens from other organs, systems and tissues: Secondary | ICD-10-CM

## 2015-02-05 DIAGNOSIS — Z5181 Encounter for therapeutic drug level monitoring: Secondary | ICD-10-CM

## 2015-02-05 LAB — POCT INR: INR: 1.5

## 2015-02-05 NOTE — Progress Notes (Signed)
Pre visit review using our clinic review tool, if applicable. No additional management support is needed unless otherwise documented below in the visit note. 

## 2015-02-05 NOTE — Telephone Encounter (Signed)
Ok to refill x 2  

## 2015-02-06 ENCOUNTER — Other Ambulatory Visit: Payer: Self-pay | Admitting: Internal Medicine

## 2015-02-06 NOTE — Telephone Encounter (Signed)
Called and given to pharmacist.

## 2015-02-07 ENCOUNTER — Ambulatory Visit (INDEPENDENT_AMBULATORY_CARE_PROVIDER_SITE_OTHER): Payer: BC Managed Care – PPO | Admitting: Psychology

## 2015-02-07 DIAGNOSIS — F411 Generalized anxiety disorder: Secondary | ICD-10-CM | POA: Diagnosis not present

## 2015-02-19 ENCOUNTER — Other Ambulatory Visit: Payer: Self-pay

## 2015-02-19 DIAGNOSIS — Z1231 Encounter for screening mammogram for malignant neoplasm of breast: Secondary | ICD-10-CM

## 2015-03-01 ENCOUNTER — Ambulatory Visit (INDEPENDENT_AMBULATORY_CARE_PROVIDER_SITE_OTHER): Payer: BC Managed Care – PPO | Admitting: Psychology

## 2015-03-01 DIAGNOSIS — F411 Generalized anxiety disorder: Secondary | ICD-10-CM | POA: Diagnosis not present

## 2015-03-02 ENCOUNTER — Other Ambulatory Visit: Payer: Self-pay | Admitting: Internal Medicine

## 2015-03-02 NOTE — Telephone Encounter (Signed)
Sent to the pharmacy by e-scribe. 

## 2015-03-05 ENCOUNTER — Ambulatory Visit (INDEPENDENT_AMBULATORY_CARE_PROVIDER_SITE_OTHER): Payer: BC Managed Care – PPO | Admitting: General Practice

## 2015-03-05 DIAGNOSIS — Z5181 Encounter for therapeutic drug level monitoring: Secondary | ICD-10-CM | POA: Diagnosis not present

## 2015-03-05 DIAGNOSIS — R894 Abnormal immunological findings in specimens from other organs, systems and tissues: Secondary | ICD-10-CM | POA: Diagnosis not present

## 2015-03-05 DIAGNOSIS — R760 Raised antibody titer: Secondary | ICD-10-CM

## 2015-03-05 LAB — POCT INR: INR: 1.4

## 2015-03-05 NOTE — Progress Notes (Signed)
Pre visit review using our clinic review tool, if applicable. No additional management support is needed unless otherwise documented below in the visit note. 

## 2015-03-15 ENCOUNTER — Ambulatory Visit (INDEPENDENT_AMBULATORY_CARE_PROVIDER_SITE_OTHER): Payer: BC Managed Care – PPO | Admitting: Psychology

## 2015-03-15 ENCOUNTER — Telehealth: Payer: Self-pay | Admitting: Internal Medicine

## 2015-03-15 DIAGNOSIS — F411 Generalized anxiety disorder: Secondary | ICD-10-CM | POA: Diagnosis not present

## 2015-03-15 NOTE — Telephone Encounter (Signed)
Pt would like a call back she said she has a question. She is taking antibiotic

## 2015-04-02 ENCOUNTER — Other Ambulatory Visit: Payer: Self-pay | Admitting: General Practice

## 2015-04-02 ENCOUNTER — Ambulatory Visit (INDEPENDENT_AMBULATORY_CARE_PROVIDER_SITE_OTHER): Payer: BC Managed Care – PPO | Admitting: General Practice

## 2015-04-02 DIAGNOSIS — R760 Raised antibody titer: Secondary | ICD-10-CM

## 2015-04-02 DIAGNOSIS — R894 Abnormal immunological findings in specimens from other organs, systems and tissues: Secondary | ICD-10-CM

## 2015-04-02 DIAGNOSIS — Z5181 Encounter for therapeutic drug level monitoring: Secondary | ICD-10-CM

## 2015-04-02 LAB — POCT INR: INR: 1.5

## 2015-04-02 MED ORDER — WARFARIN SODIUM 2.5 MG PO TABS: ORAL_TABLET | ORAL | Status: DC

## 2015-04-02 NOTE — Progress Notes (Signed)
Pre visit review using our clinic review tool, if applicable. No additional management support is needed unless otherwise documented below in the visit note. 

## 2015-04-05 ENCOUNTER — Ambulatory Visit (INDEPENDENT_AMBULATORY_CARE_PROVIDER_SITE_OTHER): Payer: BC Managed Care – PPO | Admitting: Psychology

## 2015-04-05 DIAGNOSIS — F411 Generalized anxiety disorder: Secondary | ICD-10-CM

## 2015-04-19 ENCOUNTER — Ambulatory Visit (INDEPENDENT_AMBULATORY_CARE_PROVIDER_SITE_OTHER): Payer: BC Managed Care – PPO | Admitting: Psychology

## 2015-04-19 DIAGNOSIS — F411 Generalized anxiety disorder: Secondary | ICD-10-CM

## 2015-04-23 ENCOUNTER — Ambulatory Visit
Admission: RE | Admit: 2015-04-23 | Discharge: 2015-04-23 | Disposition: A | Discharge status: Home / Self Care | Payer: BC Managed Care – PPO | Source: Ambulatory Visit

## 2015-04-23 DIAGNOSIS — Z1231 Encounter for screening mammogram for malignant neoplasm of breast: Secondary | ICD-10-CM

## 2015-04-25 ENCOUNTER — Other Ambulatory Visit: Payer: Self-pay | Admitting: Internal Medicine

## 2015-04-27 NOTE — Telephone Encounter (Signed)
Ok 6 months for both

## 2015-04-30 ENCOUNTER — Ambulatory Visit (INDEPENDENT_AMBULATORY_CARE_PROVIDER_SITE_OTHER): Payer: BC Managed Care – PPO | Admitting: General Practice

## 2015-04-30 ENCOUNTER — Telehealth: Payer: Self-pay | Admitting: Internal Medicine

## 2015-04-30 DIAGNOSIS — R894 Abnormal immunological findings in specimens from other organs, systems and tissues: Secondary | ICD-10-CM

## 2015-04-30 DIAGNOSIS — Z5181 Encounter for therapeutic drug level monitoring: Secondary | ICD-10-CM | POA: Diagnosis not present

## 2015-04-30 DIAGNOSIS — R760 Raised antibody titer: Secondary | ICD-10-CM

## 2015-04-30 LAB — POCT INR: INR: 1.5

## 2015-04-30 NOTE — Telephone Encounter (Signed)
Sent to the pharmacy by e-scribe. 

## 2015-04-30 NOTE — Telephone Encounter (Signed)
Duplicate Request.

## 2015-04-30 NOTE — Progress Notes (Signed)
Pre visit review using our clinic review tool, if applicable. No additional management support is needed unless otherwise documented below in the visit note. 

## 2015-04-30 NOTE — Telephone Encounter (Signed)
Pt going out of town early Wednesday morning and needs a refill of Nascobal and Sumatriptan. If you have questions or concerns, please give her a call.  Pt's ph# [phone] Thank you.

## 2015-05-01 ENCOUNTER — Encounter: Payer: Self-pay | Admitting: Internal Medicine

## 2015-05-01 ENCOUNTER — Ambulatory Visit (INDEPENDENT_AMBULATORY_CARE_PROVIDER_SITE_OTHER): Payer: BC Managed Care – PPO | Admitting: Internal Medicine

## 2015-05-01 ENCOUNTER — Telehealth: Payer: Self-pay | Admitting: Internal Medicine

## 2015-05-01 VITALS — BP 114/74 | Temp 97.8°F | Wt 164.7 lb

## 2015-05-01 DIAGNOSIS — J069 Acute upper respiratory infection, unspecified: Secondary | ICD-10-CM | POA: Diagnosis not present

## 2015-05-01 DIAGNOSIS — Z7901 Long term (current) use of anticoagulants: Secondary | ICD-10-CM | POA: Diagnosis not present

## 2015-05-01 DIAGNOSIS — Z87898 Personal history of other specified conditions: Secondary | ICD-10-CM | POA: Diagnosis not present

## 2015-05-01 DIAGNOSIS — J029 Acute pharyngitis, unspecified: Secondary | ICD-10-CM | POA: Diagnosis not present

## 2015-05-01 DIAGNOSIS — Z789 Other specified health status: Secondary | ICD-10-CM

## 2015-05-01 LAB — POCT RAPID STREP A (OFFICE): RAPID STREP A SCREEN: NEGATIVE

## 2015-05-01 MED ORDER — AMOXICILLIN-POT CLAVULANATE 875-125 MG PO TABS: 1.0000 | ORAL_TABLET | Freq: Two times a day (BID) | ORAL | Status: DC

## 2015-05-01 NOTE — Patient Instructions (Signed)
This is most likely a viral respiratory infection that should resolve on its own.  If getting  Prolonged sinus  Pressure   congestion   Over 7- 10 days without improvement  Then antibiotics mayt help  Although I will be out of office  Can contact Misty if needed about status   Your exam is reassuring today  Probably not a strep infection . Culture pending

## 2015-05-01 NOTE — Telephone Encounter (Signed)
Pt seen in OV today. 

## 2015-05-01 NOTE — Progress Notes (Signed)
Pre visit review using our clinic review tool, if applicable. No additional management support is needed unless otherwise documented below in the visit note.  Chief Complaint  Patient presents with  . Sore Throat  . Nasal Congestion  . Generalized Body Aches    HPI: Patient [name]  comes in today for SDA for  new problem evaluation. Onset 1-2 days and not that bacd but going to [place] tomorrow for holiday trip to see son.    Expectant management. Check for strep  Can she have an antibiotic in case  To avoid ugent care out there.  ROS: See pertinent positives and negatives per HPI. No fever cough st worse right side into ear   Past Medical History  Diagnosis Date  . Chest pain, unspecified   . Palpitations   . Personal history of venous thrombosis and embolism   . Chronic anticoagulation   . Adjustment disorder with anxiety   . Other and unspecified hyperlipidemia   . B12 deficiency due to diet     is a vegetarian  . Unspecified vitamin D deficiency   . Otalgia, unspecified   . Iron deficiency anemia, unspecified   . Rheumatoid arthritis(714.0)     deveshewar  . Headache(784.0)     migraines and head pain  . Hx of varicella     As Child  . UTI (lower urinary tract infection) 11/28/2011    citrobacter       Family History  Problem Relation Age of Onset  . Dementia Father   . Stroke Father   . Hyperlipidemia Father   . Diabetes Father   . Parkinsonism Mother   . Lymphoma      Social History   Social History  . Marital Status: Married    Spouse Name: N/A  . Number of Children: N/A  . Years of Education: N/A   Social History Main Topics  . Smoking status: Never Smoker   . Smokeless tobacco: Never Used  . Alcohol Use: No  . Drug Use: No  . Sexual Activity: Not Asked   Other Topics Concern  . None   Social History Narrative   Regular Exercise-no   20 yrs in [place]   Son at [facility] [employer]   From [place].   Married [facility]  of 3    G1P1              Outpatient Prescriptions Prior to Visit  Medication Sig Dispense Refill  . alendronate (FOSAMAX) 70 MG tablet once a week.     . Calcium Carb-Cholecalciferol (CALTRATE 600+D) 600-800 MG-UNIT TABS Take by mouth.    Marland Kitchen CARMOL 40 40 % CREA APPLY  CREAM TOPICALLY EVERY DAY 29 each 1  . Cholecalciferol (VITAMIN D3) 2000 UNITS TABS Take 1 tablet by mouth daily.    . cycloSPORINE (RESTASIS) 0.05 % ophthalmic emulsion Place 1 drop into both eyes daily.      . folic acid (FOLVITE) 1 MG tablet Take 1 mg by mouth 2 (two) times daily.      . hydroxychloroquine (PLAQUENIL) 200 MG tablet Take 200 mg by mouth 2 (two) times daily.      . methotrexate (RHEUMATREX) 2.5 MG tablet Take 20 mg by mouth once a week.     . mineral/vitamin supplement (MULTIGEN) 70 MG TABS tablet TAKE ONE TABLET BY MOUTH ONCE DAILY 30 tablet 5  . NASCOBAL 500 MCG/0.1ML SOLN USE 50 MCG AS DIRECTED INTO THE NOSE 8 Bottle 5  . predniSONE (DELTASONE) 1 MG  tablet daily. 1.5 mg daily    . SUMAtriptan (IMITREX) 50 MG tablet TAKE ONE TABLET BY MOUTH AS NEEDED. MAY REPEAT IN 2 TO 4 HOURS IF NEEDED. 9 tablet 5  . warfarin (COUMADIN) 2.5 MG tablet Take as directed by coumadin clinic 150 tablet 3  . XANAX 0.25 MG tablet TAKE ONE TABLET BY MOUTH AT BEDTIME AS NEEDED FOR ANXIETY FOR SLEEP 60 tablet 1  . NON FORMULARY     . TRAVATAN Z 0.004 % SOLN ophthalmic solution      No facility-administered medications prior to visit.     EXAM:  BP 114/74 mmHg  Temp(Src) 97.8 F (36.6 C) (Oral)  Wt 164 lb 11.2 oz (74.707 kg)  Body mass index is 29.18 kg/(m^2). Not ill appearing  GENERAL: vitals reviewed and listed above, alert, oriented, appears well hydrated and in no acute distress mild congstion  HEENT: atraumatic, conjunctiva  clear, no obvious abnormalities on inspection of external nose and ears tm nl mild nasal congestions OP : no lesion edema or exudate mild erythema  NECK: no obvious masses on inspection palpation  LUNGS: clear to  auscultation bilaterally, no wheezes, rales or rhonchi, good air movement CV: HRRR, no clubbing cyanosis or  peripheral edema nl cap refill  MS: moves all extremities without noticeable focal  abnormality PSYCH: pleasant and cooperative,   ASSESSMENT AND PLAN:  Discussed the following assessment and plan:  Sore throat - Plan: POC Rapid Strep A, Throat culture ([facility])  URI, acute  Chronic anticoagulation  Medically complex patient High risk   observ rx as needed   rx given for augmentin if pos Tcx or  Persistent sinusitis    Can contact MIsty CMA when i am out of office if need further instruction   . No med  If resolves on own with out alarm sx  -Patient advised to return or notify health care team  if symptoms worsen ,persist or new concerns arise.  Patient Instructions  This is most likely a viral respiratory infection that should resolve on its own.  If getting  Prolonged sinus  Pressure   congestion   Over 7- 10 days without improvement  Then antibiotics mayt help  Although I will be out of office  Can contact Misty if needed about status   Your exam is reassuring today  Probably not a strep infection . Culture pending      Standley Brooking. Hager Compston M.D.

## 2015-05-01 NOTE — Telephone Encounter (Signed)
Patient Name: [name]  DOB: [birth year]    Initial Comment Caller states she has sore throat, may need strep test and antibiotics today, as she is going to be flying tomorrow   Nurse Assessment  Nurse: Orvan Seen, RN, Jacquilin Date/Time (Eastern Time): 05/01/2015 9:28:08 AM  Confirm and document reason for call. If symptomatic, describe symptoms. ---Caller states she has sore throat, may need strep test and antibiotics today, as she is going to be flying tomorrow- no fever  Has the patient traveled out of the country within the last 30 days? ---No  Does the patient have any new or worsening symptoms? ---Yes  Will a triage be completed? ---Yes  Related visit to physician within the last 2 weeks? ---No  Does the PT have any chronic conditions? (i.e. diabetes, asthma, etc.) ---No  Is this a behavioral health or substance abuse call? ---No     Guidelines    Guideline Title Affirmed Question Affirmed Notes  Sore Throat [1] Adult is leaving on a trip AND [2] requests an antibiotic NOW    Final Disposition User   Lab Test Only within 24 Hours Atkins, RN, Jacquilin    Referrals  REFERRED TO PCP OFFICE   Disagree/Comply: Comply

## 2015-05-03 ENCOUNTER — Ambulatory Visit: Payer: Self-pay | Admitting: Psychology

## 2015-05-03 LAB — CULTURE, GROUP A STREP: Organism ID, Bacteria: NORMAL

## 2015-05-24 ENCOUNTER — Other Ambulatory Visit: Payer: Self-pay

## 2015-05-28 ENCOUNTER — Ambulatory Visit: Payer: BC Managed Care – PPO | Admitting: General Practice

## 2015-05-28 DIAGNOSIS — R894 Abnormal immunological findings in specimens from other organs, systems and tissues: Secondary | ICD-10-CM | POA: Diagnosis not present

## 2015-05-28 DIAGNOSIS — Z5181 Encounter for therapeutic drug level monitoring: Secondary | ICD-10-CM | POA: Diagnosis not present

## 2015-05-28 DIAGNOSIS — R760 Raised antibody titer: Secondary | ICD-10-CM

## 2015-05-28 LAB — POCT INR: INR: 1.6

## 2015-05-28 NOTE — Progress Notes (Signed)
Pre visit review using our clinic review tool, if applicable. No additional management support is needed unless otherwise documented below in the visit note. 

## 2015-05-31 ENCOUNTER — Ambulatory Visit (INDEPENDENT_AMBULATORY_CARE_PROVIDER_SITE_OTHER): Payer: BC Managed Care – PPO | Admitting: Psychology

## 2015-05-31 DIAGNOSIS — F411 Generalized anxiety disorder: Secondary | ICD-10-CM | POA: Diagnosis not present

## 2015-06-01 ENCOUNTER — Encounter: Payer: Self-pay | Admitting: Internal Medicine

## 2015-06-11 ENCOUNTER — Encounter: Payer: Self-pay | Admitting: Internal Medicine

## 2015-06-14 ENCOUNTER — Ambulatory Visit: Payer: BC Managed Care – PPO | Admitting: Psychology

## 2015-06-19 ENCOUNTER — Other Ambulatory Visit: Payer: Self-pay | Admitting: Obstetrics and Gynecology

## 2015-06-19 ENCOUNTER — Other Ambulatory Visit (HOSPITAL_COMMUNITY)
Admission: RE | Admit: 2015-06-19 | Discharge: 2015-06-19 | Disposition: A | Discharge status: Home / Self Care | Payer: BC Managed Care – PPO | Source: Ambulatory Visit | Attending: Obstetrics and Gynecology | Admitting: Obstetrics and Gynecology

## 2015-06-19 DIAGNOSIS — Z01419 Encounter for gynecological examination (general) (routine) without abnormal findings: Secondary | ICD-10-CM | POA: Diagnosis present

## 2015-06-19 DIAGNOSIS — Z1151 Encounter for screening for human papillomavirus (HPV): Secondary | ICD-10-CM | POA: Diagnosis present

## 2015-06-20 ENCOUNTER — Other Ambulatory Visit (INDEPENDENT_AMBULATORY_CARE_PROVIDER_SITE_OTHER): Payer: BC Managed Care – PPO

## 2015-06-20 DIAGNOSIS — Z0000 Encounter for general adult medical examination without abnormal findings: Secondary | ICD-10-CM | POA: Diagnosis not present

## 2015-06-20 DIAGNOSIS — E538 Deficiency of other specified B group vitamins: Secondary | ICD-10-CM

## 2015-06-20 DIAGNOSIS — D509 Iron deficiency anemia, unspecified: Secondary | ICD-10-CM

## 2015-06-20 LAB — CBC WITH DIFFERENTIAL/PLATELET
BASOS PCT: 0.5 % (ref 0.0–3.0)
Basophils Absolute: 0 10*3/uL (ref 0.0–0.1)
EOS PCT: 1.8 % (ref 0.0–5.0)
Eosinophils Absolute: 0.2 10*3/uL (ref 0.0–0.7)
HCT: 43.6 % (ref 36.0–46.0)
Hemoglobin: 14.1 g/dL (ref 12.0–15.0)
LYMPHS ABS: 3.5 10*3/uL (ref 0.7–4.0)
Lymphocytes Relative: 38.1 % (ref 12.0–46.0)
MCHC: 32.4 g/dL (ref 30.0–36.0)
MCV: 84.7 fl (ref 78.0–100.0)
MONO ABS: 0.7 10*3/uL (ref 0.1–1.0)
Monocytes Relative: 7.3 % (ref 3.0–12.0)
NEUTROS ABS: 4.8 10*3/uL (ref 1.4–7.7)
NEUTROS PCT: 52.3 % (ref 43.0–77.0)
PLATELETS: 227 10*3/uL (ref 150.0–400.0)
RBC: 5.15 Mil/uL — ABNORMAL HIGH (ref 3.87–5.11)
RDW: 13 % (ref 11.5–15.5)
WBC: 9.1 10*3/uL (ref 4.0–10.5)

## 2015-06-20 LAB — BASIC METABOLIC PANEL
BUN: 10 mg/dL (ref 6–23)
CHLORIDE: 106 meq/L (ref 96–112)
CO2: 26 mEq/L (ref 19–32)
Calcium: 9.4 mg/dL (ref 8.4–10.5)
Creatinine, Ser: 0.71 mg/dL (ref 0.40–1.20)
GFR: 90.19 mL/min (ref >60.00)
GLUCOSE: 93 mg/dL (ref 70–99)
POTASSIUM: 4.3 meq/L (ref 3.5–5.1)
Sodium: 142 mEq/L (ref 135–145)

## 2015-06-20 LAB — IBC PANEL
Iron: 70 ug/dL (ref 42–145)
SATURATION RATIOS: 20.5 % (ref 20.0–50.0)
Transferrin: 244 mg/dL (ref 212.0–360.0)

## 2015-06-20 LAB — LIPID PANEL
CHOLESTEROL: 179 mg/dL (ref 0–200)
HDL: 70.6 mg/dL (ref >39.00)
LDL Cholesterol: 86 mg/dL (ref 0–99)
NonHDL: 108.86
Total CHOL/HDL Ratio: 3
Triglycerides: 115 mg/dL (ref 0.0–149.0)
VLDL: 23 mg/dL (ref 0.0–40.0)

## 2015-06-20 LAB — HEPATIC FUNCTION PANEL
ALT: 14 U/L (ref 0–35)
AST: 17 U/L (ref 0–37)
Albumin: 3.9 g/dL (ref 3.5–5.2)
Alkaline Phosphatase: 88 U/L (ref 39–117)
BILIRUBIN TOTAL: 0.6 mg/dL (ref 0.2–1.2)
Bilirubin, Direct: 0.1 mg/dL (ref 0.0–0.3)
Total Protein: 7 g/dL (ref 6.0–8.3)

## 2015-06-20 LAB — VITAMIN B12: VITAMIN B 12: 508 pg/mL (ref 211–911)

## 2015-06-20 LAB — CYTOLOGY - PAP

## 2015-06-20 LAB — TSH: TSH: 3.37 u[IU]/mL (ref 0.35–4.50)

## 2015-06-26 ENCOUNTER — Encounter: Payer: Self-pay | Admitting: Internal Medicine

## 2015-06-27 ENCOUNTER — Ambulatory Visit (INDEPENDENT_AMBULATORY_CARE_PROVIDER_SITE_OTHER): Payer: BC Managed Care – PPO | Admitting: General Practice

## 2015-06-27 ENCOUNTER — Ambulatory Visit (INDEPENDENT_AMBULATORY_CARE_PROVIDER_SITE_OTHER): Payer: BC Managed Care – PPO | Admitting: Internal Medicine

## 2015-06-27 ENCOUNTER — Encounter: Payer: Self-pay | Admitting: Internal Medicine

## 2015-06-27 VITALS — BP 114/74 | Temp 97.7°F | Ht 62.5 in | Wt 163.8 lb

## 2015-06-27 DIAGNOSIS — Z7952 Long term (current) use of systemic steroids: Secondary | ICD-10-CM

## 2015-06-27 DIAGNOSIS — Z79899 Other long term (current) drug therapy: Secondary | ICD-10-CM | POA: Diagnosis not present

## 2015-06-27 DIAGNOSIS — Z5181 Encounter for therapeutic drug level monitoring: Secondary | ICD-10-CM | POA: Diagnosis not present

## 2015-06-27 DIAGNOSIS — E611 Iron deficiency: Secondary | ICD-10-CM

## 2015-06-27 DIAGNOSIS — Z23 Encounter for immunization: Secondary | ICD-10-CM | POA: Diagnosis not present

## 2015-06-27 DIAGNOSIS — R760 Raised antibody titer: Secondary | ICD-10-CM

## 2015-06-27 DIAGNOSIS — F419 Anxiety disorder, unspecified: Secondary | ICD-10-CM

## 2015-06-27 DIAGNOSIS — Z7901 Long term (current) use of anticoagulants: Secondary | ICD-10-CM | POA: Diagnosis not present

## 2015-06-27 DIAGNOSIS — Z87898 Personal history of other specified conditions: Secondary | ICD-10-CM | POA: Diagnosis not present

## 2015-06-27 DIAGNOSIS — R894 Abnormal immunological findings in specimens from other organs, systems and tissues: Secondary | ICD-10-CM | POA: Diagnosis not present

## 2015-06-27 DIAGNOSIS — Z789 Other specified health status: Secondary | ICD-10-CM

## 2015-06-27 DIAGNOSIS — Z0000 Encounter for general adult medical examination without abnormal findings: Secondary | ICD-10-CM | POA: Diagnosis not present

## 2015-06-27 MED ORDER — SUMATRIPTAN SUCCINATE 50 MG PO TABS: ORAL_TABLET | ORAL | Status: DC

## 2015-06-27 MED ORDER — XANAX 0.25 MG PO TABS: ORAL_TABLET | ORAL | Status: DC

## 2015-06-27 MED ORDER — UREA 40 % EX CREA: TOPICAL_CREAM | CUTANEOUS | Status: DC

## 2015-06-27 NOTE — Progress Notes (Signed)
Pre visit review using our clinic review tool, if applicable. No additional management support is needed unless otherwise documented below in the visit note. 

## 2015-06-27 NOTE — Patient Instructions (Addendum)
Flu vaccine is a low risk high value    Vaccine .  Stay with t he yoga    Avoid     Health Maintenance, Female Adopting a healthy lifestyle and getting preventive care can go a long way to promote health and wellness. Talk with your health care provider about what schedule of regular examinations is right for you. This is a good chance for you to check in with your provider about disease prevention and staying healthy. In between checkups, there are plenty of things you can do on your own. Experts have done a lot of research about which lifestyle changes and preventive measures are most likely to keep you healthy. Ask your health care provider for more information. WEIGHT AND DIET  Eat a healthy diet  Be sure to include plenty of vegetables, fruits, low-fat dairy products, and lean protein.  Do not eat a lot of foods high in solid fats, added sugars, or salt.  Get regular exercise. This is one of the most important things you can do for your health.  Most adults should exercise for at least 150 minutes each week. The exercise should increase your heart rate and make you sweat (moderate-intensity exercise).  Most adults should also do strengthening exercises at least twice a week. This is in addition to the moderate-intensity exercise.  Maintain a healthy weight  Body mass index (BMI) is a measurement that can be used to identify possible weight problems. It estimates body fat based on height and weight. Your health care provider can help determine your BMI and help you achieve or maintain a healthy weight.  For females 50 years of age and older:   A BMI below 18.5 is considered underweight.  A BMI of 18.5 to 24.9 is normal.  A BMI of 25 to 29.9 is considered overweight.  A BMI of 30 and above is considered obese.  Watch levels of cholesterol and blood lipids  You should start having your blood tested for lipids and cholesterol at 57 years of age, then have this test every 5  years.  You may need to have your cholesterol levels checked more often if:  Your lipid or cholesterol levels are high.  You are older than 57 years of age.  You are at high risk for heart disease.  CANCER SCREENING   Lung Cancer  Lung cancer screening is recommended for adults 49-2 years old who are at high risk for lung cancer because of a history of smoking.  A yearly low-dose CT scan of the lungs is recommended for people who:  Currently smoke.  Have quit within the past 15 years.  Have at least a 30-pack-year history of smoking. A pack year is smoking an average of one pack of cigarettes a day for 1 year.  Yearly screening should continue until it has been 15 years since you quit.  Yearly screening should stop if you develop a health problem that would prevent you from having lung cancer treatment.  Breast Cancer  Practice breast self-awareness. This means understanding how your breasts normally appear and feel.  It also means doing regular breast self-exams. Let your health care provider know about any changes, no matter how small.  If you are in your 20s or 30s, you should have a clinical breast exam (CBE) by a health care provider every 1-3 years as part of a regular health exam.  If you are 49 or older, have a CBE every year. Also consider having a  breast X-ray (mammogram) every year.  If you have a family history of breast cancer, talk to your health care provider about genetic screening.  If you are at high risk for breast cancer, talk to your health care provider about having an MRI and a mammogram every year.  Breast cancer gene (BRCA) assessment is recommended for women who have family members with BRCA-related cancers. BRCA-related cancers include:  Breast.  Ovarian.  Tubal.  Peritoneal cancers.  Results of the assessment will determine the need for genetic counseling and BRCA1 and BRCA2 testing. Cervical Cancer Your health care provider may  recommend that you be screened regularly for cancer of the pelvic organs (ovaries, uterus, and vagina). This screening involves a pelvic examination, including checking for microscopic changes to the surface of your cervix (Pap test). You may be encouraged to have this screening done every 3 years, beginning at age 33.  For women ages 13-65, health care providers may recommend pelvic exams and Pap testing every 3 years, or they may recommend the Pap and pelvic exam, combined with testing for human papilloma virus (HPV), every 5 years. Some types of HPV increase your risk of cervical cancer. Testing for HPV may also be done on women of any age with unclear Pap test results.  Other health care providers may not recommend any screening for nonpregnant women who are considered low risk for pelvic cancer and who do not have symptoms. Ask your health care provider if a screening pelvic exam is right for you.  If you have had past treatment for cervical cancer or a condition that could lead to cancer, you need Pap tests and screening for cancer for at least 20 years after your treatment. If Pap tests have been discontinued, your risk factors (such as having a new sexual partner) need to be reassessed to determine if screening should resume. Some women have medical problems that increase the chance of getting cervical cancer. In these cases, your health care provider may recommend more frequent screening and Pap tests. Colorectal Cancer  This type of cancer can be detected and often prevented.  Routine colorectal cancer screening usually begins at 57 years of age and continues through 57 years of age.  Your health care provider may recommend screening at an earlier age if you have risk factors for colon cancer.  Your health care provider may also recommend using home test kits to check for hidden blood in the stool.  A small camera at the end of a tube can be used to examine your colon directly  (sigmoidoscopy or colonoscopy). This is done to check for the earliest forms of colorectal cancer.  Routine screening usually begins at age 20.  Direct examination of the colon should be repeated every 5-10 years through 57 years of age. However, you may need to be screened more often if early forms of precancerous polyps or small growths are found. Skin Cancer  Check your skin from head to toe regularly.  Tell your health care provider about any new moles or changes in moles, especially if there is a change in a mole's shape or color.  Also tell your health care provider if you have a mole that is larger than the size of a pencil eraser.  Always use sunscreen. Apply sunscreen liberally and repeatedly throughout the day.  Protect yourself by wearing long sleeves, pants, a wide-brimmed hat, and sunglasses whenever you are outside. HEART DISEASE, DIABETES, AND HIGH BLOOD PRESSURE   High blood pressure causes  heart disease and increases the risk of stroke. High blood pressure is more likely to develop in:  People who have blood pressure in the high end of the normal range (130-139/85-89 mm Hg).  People who are overweight or obese.  People who are African American.  If you are 38-23 years of age, have your blood pressure checked every 3-5 years. If you are 53 years of age or older, have your blood pressure checked every year. You should have your blood pressure measured twice--once when you are at a hospital or clinic, and once when you are not at a hospital or clinic. Record the average of the two measurements. To check your blood pressure when you are not at a hospital or clinic, you can use:  An automated blood pressure machine at a pharmacy.  A home blood pressure monitor.  If you are between 42 years and 65 years old, ask your health care provider if you should take aspirin to prevent strokes.  Have regular diabetes screenings. This involves taking a blood sample to check your  fasting blood sugar level.  If you are at a normal weight and have a low risk for diabetes, have this test once every three years after 57 years of age.  If you are overweight and have a high risk for diabetes, consider being tested at a younger age or more often. PREVENTING INFECTION  Hepatitis B  If you have a higher risk for hepatitis B, you should be screened for this virus. You are considered at high risk for hepatitis B if:  You were born in a country where hepatitis B is common. Ask your health care provider which countries are considered high risk.  Your parents were born in a high-risk country, and you have not been immunized against hepatitis B (hepatitis B vaccine).  You have HIV or AIDS.  You use needles to inject street drugs.  You live with someone who has hepatitis B.  You have had sex with someone who has hepatitis B.  You get hemodialysis treatment.  You take certain medicines for conditions, including cancer, organ transplantation, and autoimmune conditions. Hepatitis C  Blood testing is recommended for:  Everyone born from [birth year] through 1967.  Anyone with known risk factors for hepatitis C. Sexually transmitted infections (STIs)  You should be screened for sexually transmitted infections (STIs) including gonorrhea and chlamydia if:  You are sexually active and are younger than 57 years of age.  You are older than 57 years of age and your health care provider tells you that you are at risk for this type of infection.  Your sexual activity has changed since you were last screened and you are at an increased risk for chlamydia or gonorrhea. Ask your health care provider if you are at risk.  If you do not have HIV, but are at risk, it may be recommended that you take a prescription medicine daily to prevent HIV infection. This is called pre-exposure prophylaxis (PrEP). You are considered at risk if:  You are sexually active and do not regularly use condoms or  know the HIV status of your partner(s).  You take drugs by injection.  You are sexually active with a partner who has HIV. Talk with your health care provider about whether you are at high risk of being infected with HIV. If you choose to begin PrEP, you should first be tested for HIV. You should then be tested every 3 months for as long as you are  taking PrEP.  PREGNANCY   If you are premenopausal and you may become pregnant, ask your health care provider about preconception counseling.  If you may become pregnant, take 400 to 800 micrograms (mcg) of folic acid every day.  If you want to prevent pregnancy, talk to your health care provider about birth control (contraception). OSTEOPOROSIS AND MENOPAUSE   Osteoporosis is a disease in which the bones lose minerals and strength with aging. This can result in serious bone fractures. Your risk for osteoporosis can be identified using a bone density scan.  If you are 30 years of age or older, or if you are at risk for osteoporosis and fractures, ask your health care provider if you should be screened.  Ask your health care provider whether you should take a calcium or vitamin D supplement to lower your risk for osteoporosis.  Menopause may have certain physical symptoms and risks.  Hormone replacement therapy may reduce some of these symptoms and risks. Talk to your health care provider about whether hormone replacement therapy is right for you.  HOME CARE INSTRUCTIONS   Schedule regular health, dental, and eye exams.  Stay current with your immunizations.   Do not use any tobacco products including cigarettes, chewing tobacco, or electronic cigarettes.  If you are pregnant, do not drink alcohol.  If you are breastfeeding, limit how much and how often you drink alcohol.  Limit alcohol intake to no more than 1 drink per day for nonpregnant women. One drink equals 12 ounces of beer, 5 ounces of wine, or 1 ounces of hard liquor.  Do  not use street drugs.  Do not share needles.  Ask your health care provider for help if you need support or information about quitting drugs.  Tell your health care provider if you often feel depressed.  Tell your health care provider if you have ever been abused or do not feel safe at home.   This information is not intended to replace advice given to you by your health care provider. Make sure you discuss any questions you have with your health care provider.   Document Released: 11/18/2010 Document Revised: 05/26/2014 Document Reviewed: 04/06/2013 [employer] Interactive Patient Education [employer].

## 2015-06-27 NOTE — Progress Notes (Signed)
Pre visit review using our clinic review tool, if applicable. No additional management support is needed unless otherwise documented below in the visit note.  Chief Complaint  Patient presents with  . Annual Exam    medications     HPI: Patient  [name]  55 y.o. comes in today for Preventive Health Care visit  And Chronic disease management  ? Flu vaccine never had this   Wait until 58  For shingles vaccine  May be ok per her specialist   hasn't gotten  eliquis  rx refilled  Would like to eat more veges but  No bleeding and low dose has done well.   No change in  RA  Labs should be sent to dr D.  Using alprazolam at night from anxiety to sleep  Sometimes has strong rapid heartbeat  in am that goes away some stress night mares at times but is ok after election  [place] from  [place]  .   Had uti  New years  Eve  [facility] .   Got better had blood and wbc in urine   Saw specialist for osteoprosis  At [facility] did celiac screen  slightly low calcium in 24 hours urine  No other abnormality  Health Maintenance  Topic Date Due  . Hepatitis C Screening  Feb 28, 1961  . HIV Screening  06/30/1973  . INFLUENZA VACCINE  12/18/2015  . COLONOSCOPY  05/19/2016  . MAMMOGRAM  04/22/2017  . PAP SMEAR  06/18/2018  . TETANUS/TDAP  06/01/2023   Health Maintenance Review LIFESTYLE:  Exercise:     Starting yoga   Tobacco/ETS: no Alcohol: no  Sugar beverages:    no Sleep:   8 hours   Drug use: no : ROS:  GEN/ HEENT: No fever, significant weight changes sweats headaches vision problems hearing changes, CV/ PULM; No chest pain shortness of breath cough, syncope,edema  change in exercise tolerance. GI /GU: No adominal pain, vomiting, change in bowel habits. No blood in the stool. No significantnew GU symptoms. SKIN/HEME: ,no acute skin rashes suspicious lesions or bleeding. No lymphadenopathy, nodules, masses.  Has hair chin after menopause ? Laser rx  NEURO/ PSYCH:  No  neurologic signs such as weakness numbness.  IMM/ Allergy: No unusual infections.  Allergy .   REST of 12 system review negative except as per HPI   Past Medical History  Diagnosis Date  . Chest pain, unspecified   . Palpitations   . Personal history of venous thrombosis and embolism   . Chronic anticoagulation   . Adjustment disorder with anxiety   . Other and unspecified hyperlipidemia   . B12 deficiency due to diet     is a vegetarian  . Unspecified vitamin D deficiency   . Otalgia, unspecified   . Iron deficiency anemia, unspecified   . Rheumatoid arthritis(714.0)     deveshewar  . Headache(784.0)     migraines and head pain  . Hx of varicella     As Child  . UTI (lower urinary tract infection) 11/28/2011    citrobacter       Past Surgical History  Procedure Laterality Date  . Tympanostomy tube placement  2009    Right    Family History  Problem Relation Age of Onset  . Dementia Father   . Stroke Father   . Hyperlipidemia Father   . Diabetes Father   . Parkinsonism Mother   . Lymphoma      Social History  Social History  . Marital Status: Married    Spouse Name: N/A  . Number of Children: N/A  . Years of Education: N/A   Social History Main Topics  . Smoking status: Never Smoker   . Smokeless tobacco: Never Used  . Alcohol Use: No  . Drug Use: No  . Sexual Activity: Not Asked   Other Topics Concern  . None   Social History Narrative   Regular Exercise-no   20 yrs in [place]   Son at [facility] [employer]   From [place].   Married HH  of 3    G1P1             Outpatient Prescriptions Prior to Visit  Medication Sig Dispense Refill  . alendronate (FOSAMAX) 70 MG tablet once a week.     . Calcium Carb-Cholecalciferol (CALTRATE 600+D) 600-800 MG-UNIT TABS Take by mouth.    . Cholecalciferol (VITAMIN D3) 2000 UNITS TABS Take 1 tablet by mouth daily.    . cycloSPORINE (RESTASIS) 0.05 % ophthalmic emulsion Place 1 drop into both eyes daily.      . folic acid  (FOLVITE) 1 MG tablet Take 1 mg by mouth 2 (two) times daily.      . hydroxychloroquine (PLAQUENIL) 200 MG tablet Take 200 mg by mouth 2 (two) times daily.      . methotrexate (RHEUMATREX) 2.5 MG tablet Take 20 mg by mouth once a week.     . mineral/vitamin supplement (MULTIGEN) 70 MG TABS tablet TAKE ONE TABLET BY MOUTH ONCE DAILY 30 tablet 5  . NASCOBAL 500 MCG/0.1ML SOLN USE 50 MCG AS DIRECTED INTO THE NOSE 8 Bottle 5  . predniSONE (DELTASONE) 1 MG tablet daily. 1.5 mg daily    . warfarin (COUMADIN) 2.5 MG tablet Take as directed by coumadin clinic 150 tablet 3  . CARMOL 40 40 % CREA APPLY  CREAM TOPICALLY EVERY DAY 29 each 1  . SUMAtriptan (IMITREX) 50 MG tablet TAKE ONE TABLET BY MOUTH AS NEEDED. MAY REPEAT IN 2 TO 4 HOURS IF NEEDED. 9 tablet 5  . XANAX 0.25 MG tablet TAKE ONE TABLET BY MOUTH AT BEDTIME AS NEEDED FOR ANXIETY FOR SLEEP 60 tablet 1  . amoxicillin-clavulanate (AUGMENTIN) 875-125 MG tablet Take 1 tablet by mouth every 12 (twelve) hours. For sinusitis or strep  if needed 14 tablet 0   No facility-administered medications prior to visit.     EXAM:  BP 114/74 mmHg  Temp(Src) 97.7 F (36.5 C) (Oral)  Ht 5' 2.5" (1.588 m)  Wt 163 lb 12.8 oz (74.299 kg)  BMI 29.46 kg/m2  Body mass index is 29.46 kg/(m^2).  Physical Exam: Vital signs reviewed KYH:CWCB is a well-developed well-nourished alert cooperative    who appearsr stated age in no acute distress.  HEENT: normocephalic atraumatic , Eyes: PERRL EOM's full, conjunctiva clear, Nares: paten,t no deformity discharge or tenderness., Ears: no deformity EAC's clear TMs with normal landmarks. Mouth: clear OP, no lesions, edema.  Moist mucous membranes. Dentition in adequate repair. NECK: supple without masses, thyromegaly or bruits. CHEST/PULM:  Clear to auscultation and percussion breath sounds equal no wheeze , rales or rhonchi. No chest wall deformities or tenderness. CV: PMI is nondisplaced, S1 S2 no gallops, murmurs, rubs.  Peripheral pulses are full without delay.No JVD .  ABDOMEN: Bowel sounds normal nontender  No guard or rebound, no hepato splenomegal no CVA tenderness.  No hernia. Extremtities:  No clubbing cyanosis or edema, no acute joint swelling or redness no focal atrophy  Skin inc low facial fair  Neck   No rash  NEURO:  Oriented x3, cranial nerves 3-12 appear to be intact, no obvious focal weakness,gait within normal limits no abnormal reflexes or asymmetrical SKIN: No acute rashes normal turgor, color, no bruising or petechiae. PSYCH: Oriented, good eye contact, no obvious depression mild anxiety , cognition and judgment appear normal. LN: no cervical axillary inguinal adenopathy  Lab Results  Component Value Date   WBC 9.1 06/20/2015   HGB 14.1 06/20/2015   HCT 43.6 06/20/2015   PLT 227.0 06/20/2015   GLUCOSE 93 06/20/2015   CHOL 179 06/20/2015   TRIG 115.0 06/20/2015   HDL 70.60 06/20/2015   LDLDIRECT 97.3 05/25/2013   LDLCALC 86 06/20/2015   ALT 14 06/20/2015   AST 17 06/20/2015   NA 142 06/20/2015   K 4.3 06/20/2015   CL 106 06/20/2015   CREATININE 0.71 06/20/2015   BUN 10 06/20/2015   CO2 26 06/20/2015   TSH 3.37 06/20/2015   INR 1.6 05/28/2015    ASSESSMENT AND PLAN:  Discussed the following assessment and plan:  Visit for preventive health examination  Medication management  Long term current use of systemic steroids  Need for prophylactic vaccination and inoculation against influenza - Plan: Flu Vaccine QUAD 36+ mos PF IM (Fluarix & Fluzone Quad PF)  Iron deficiency - follow  no bleeding  Long term (current) use of anticoagulants - considering low dose eliquis  doing well currently on coumaidn but avoiding greens   Anxiety - beneift risk of medication   lsi  refill alprazolam   Medically complex patient  Patient Care Team: Burnis Medin, MD as PCP - General Bo Merino, MD (Rheumatology) Richmond Campbell, MD (Gastroenterology) Leta Baptist, MD  (Otolaryngology) Larey Seat, MD (Neurology) Rolm Bookbinder, MD (Dermatology) Carolan Clines, MD as Attending Physician (Urology) Christophe Louis, MD as Consulting Physician (Obstetrics and Gynecology) Paralee Cancel, MD as Referring Physician (Hematology and Oncology) Charlotte Crumb, MD as Consulting Physician (Orthopedic Surgery) [facility] Patient Instructions  Flu vaccine is a low risk high value    Vaccine .  Stay with t he yoga    Avoid     Health Maintenance, Female Adopting a healthy lifestyle and getting preventive care can go a long way to promote health and wellness. Talk with your health care provider about what schedule of regular examinations is right for you. This is a good chance for you to check in with your provider about disease prevention and staying healthy. In between checkups, there are plenty of things you can do on your own. Experts have done a lot of research about which lifestyle changes and preventive measures are most likely to keep you healthy. Ask your health care provider for more information. WEIGHT AND DIET  Eat a healthy diet  Be sure to include plenty of vegetables, fruits, low-fat dairy products, and lean protein.  Do not eat a lot of foods high in solid fats, added sugars, or salt.  Get regular exercise. This is one of the most important things you can do for your health.  Most adults should exercise for at least 150 minutes each week. The exercise should increase your heart rate and make you sweat (moderate-intensity exercise).  Most adults should also do strengthening exercises at least twice a week. This is in addition to the moderate-intensity exercise.  Maintain a healthy weight  Body mass index (BMI) is a measurement that can be used to identify possible weight problems. It  estimates body fat based on height and weight. Your health care provider can help determine your BMI and help you achieve or maintain a healthy weight.  For  females 9 years of age and older:   A BMI below 18.5 is considered underweight.  A BMI of 18.5 to 24.9 is normal.  A BMI of 25 to 29.9 is considered overweight.  A BMI of 30 and above is considered obese.  Watch levels of cholesterol and blood lipids  You should start having your blood tested for lipids and cholesterol at 57 years of age, then have this test every 5 years.  You may need to have your cholesterol levels checked more often if:  Your lipid or cholesterol levels are high.  You are older than 57 years of age.  You are at high risk for heart disease.  CANCER SCREENING   Lung Cancer  Lung cancer screening is recommended for adults 23-67 years old who are at high risk for lung cancer because of a history of smoking.  A yearly low-dose CT scan of the lungs is recommended for people who:  Currently smoke.  Have quit within the past 15 years.  Have at least a 30-pack-year history of smoking. A pack year is smoking an average of one pack of cigarettes a day for 1 year.  Yearly screening should continue until it has been 15 years since you quit.  Yearly screening should stop if you develop a health problem that would prevent you from having lung cancer treatment.  Breast Cancer  Practice breast self-awareness. This means understanding how your breasts normally appear and feel.  It also means doing regular breast self-exams. Let your health care provider know about any changes, no matter how small.  If you are in your 20s or 30s, you should have a clinical breast exam (CBE) by a health care provider every 1-3 years as part of a regular health exam.  If you are 43 or older, have a CBE every year. Also consider having a breast X-ray (mammogram) every year.  If you have a family history of breast cancer, talk to your health care provider about genetic screening.  If you are at high risk for breast cancer, talk to your health care provider about having an MRI and  a mammogram every year.  Breast cancer gene (BRCA) assessment is recommended for women who have family members with BRCA-related cancers. BRCA-related cancers include:  Breast.  Ovarian.  Tubal.  Peritoneal cancers.  Results of the assessment will determine the need for genetic counseling and BRCA1 and BRCA2 testing. Cervical Cancer Your health care provider may recommend that you be screened regularly for cancer of the pelvic organs (ovaries, uterus, and vagina). This screening involves a pelvic examination, including checking for microscopic changes to the surface of your cervix (Pap test). You may be encouraged to have this screening done every 3 years, beginning at age 25.  For women ages 39-65, health care providers may recommend pelvic exams and Pap testing every 3 years, or they may recommend the Pap and pelvic exam, combined with testing for human papilloma virus (HPV), every 5 years. Some types of HPV increase your risk of cervical cancer. Testing for HPV may also be done on women of any age with unclear Pap test results.  Other health care providers may not recommend any screening for nonpregnant women who are considered low risk for pelvic cancer and who do not have symptoms. Ask your health care provider if  a screening pelvic exam is right for you.  If you have had past treatment for cervical cancer or a condition that could lead to cancer, you need Pap tests and screening for cancer for at least 20 years after your treatment. If Pap tests have been discontinued, your risk factors (such as having a new sexual partner) need to be reassessed to determine if screening should resume. Some women have medical problems that increase the chance of getting cervical cancer. In these cases, your health care provider may recommend more frequent screening and Pap tests. Colorectal Cancer  This type of cancer can be detected and often prevented.  Routine colorectal cancer screening usually  begins at 57 years of age and continues through 57 years of age.  Your health care provider may recommend screening at an earlier age if you have risk factors for colon cancer.  Your health care provider may also recommend using home test kits to check for hidden blood in the stool.  A small camera at the end of a tube can be used to examine your colon directly (sigmoidoscopy or colonoscopy). This is done to check for the earliest forms of colorectal cancer.  Routine screening usually begins at age 14.  Direct examination of the colon should be repeated every 5-10 years through 57 years of age. However, you may need to be screened more often if early forms of precancerous polyps or small growths are found. Skin Cancer  Check your skin from head to toe regularly.  Tell your health care provider about any new moles or changes in moles, especially if there is a change in a mole's shape or color.  Also tell your health care provider if you have a mole that is larger than the size of a pencil eraser.  Always use sunscreen. Apply sunscreen liberally and repeatedly throughout the day.  Protect yourself by wearing long sleeves, pants, a wide-brimmed hat, and sunglasses whenever you are outside. HEART DISEASE, DIABETES, AND HIGH BLOOD PRESSURE   High blood pressure causes heart disease and increases the risk of stroke. High blood pressure is more likely to develop in:  People who have blood pressure in the high end of the normal range (130-139/85-89 mm Hg).  People who are overweight or obese.  People who are African American.  If you are 12-41 years of age, have your blood pressure checked every 3-5 years. If you are 43 years of age or older, have your blood pressure checked every year. You should have your blood pressure measured twice--once when you are at a hospital or clinic, and once when you are not at a hospital or clinic. Record the average of the two measurements. To check your blood  pressure when you are not at a hospital or clinic, you can use:  An automated blood pressure machine at a pharmacy.  A home blood pressure monitor.  If you are between 44 years and 17 years old, ask your health care provider if you should take aspirin to prevent strokes.  Have regular diabetes screenings. This involves taking a blood sample to check your fasting blood sugar level.  If you are at a normal weight and have a low risk for diabetes, have this test once every three years after 57 years of age.  If you are overweight and have a high risk for diabetes, consider being tested at a younger age or more often. PREVENTING INFECTION  Hepatitis B  If you have a higher risk for hepatitis B,  you should be screened for this virus. You are considered at high risk for hepatitis B if:  You were born in a country where hepatitis B is common. Ask your health care provider which countries are considered high risk.  Your parents were born in a high-risk country, and you have not been immunized against hepatitis B (hepatitis B vaccine).  You have HIV or AIDS.  You use needles to inject street drugs.  You live with someone who has hepatitis B.  You have had sex with someone who has hepatitis B.  You get hemodialysis treatment.  You take certain medicines for conditions, including cancer, organ transplantation, and autoimmune conditions. Hepatitis C  Blood testing is recommended for:  Everyone born from [birth year] through 1967.  Anyone with known risk factors for hepatitis C. Sexually transmitted infections (STIs)  You should be screened for sexually transmitted infections (STIs) including gonorrhea and chlamydia if:  You are sexually active and are younger than 57 years of age.  You are older than 57 years of age and your health care provider tells you that you are at risk for this type of infection.  Your sexual activity has changed since you were last screened and you are at an  increased risk for chlamydia or gonorrhea. Ask your health care provider if you are at risk.  If you do not have HIV, but are at risk, it may be recommended that you take a prescription medicine daily to prevent HIV infection. This is called pre-exposure prophylaxis (PrEP). You are considered at risk if:  You are sexually active and do not regularly use condoms or know the HIV status of your partner(s).  You take drugs by injection.  You are sexually active with a partner who has HIV. Talk with your health care provider about whether you are at high risk of being infected with HIV. If you choose to begin PrEP, you should first be tested for HIV. You should then be tested every 3 months for as long as you are taking PrEP.  PREGNANCY   If you are premenopausal and you may become pregnant, ask your health care provider about preconception counseling.  If you may become pregnant, take 400 to 800 micrograms (mcg) of folic acid every day.  If you want to prevent pregnancy, talk to your health care provider about birth control (contraception). OSTEOPOROSIS AND MENOPAUSE   Osteoporosis is a disease in which the bones lose minerals and strength with aging. This can result in serious bone fractures. Your risk for osteoporosis can be identified using a bone density scan.  If you are 51 years of age or older, or if you are at risk for osteoporosis and fractures, ask your health care provider if you should be screened.  Ask your health care provider whether you should take a calcium or vitamin D supplement to lower your risk for osteoporosis.  Menopause may have certain physical symptoms and risks.  Hormone replacement therapy may reduce some of these symptoms and risks. Talk to your health care provider about whether hormone replacement therapy is right for you.  HOME CARE INSTRUCTIONS   Schedule regular health, dental, and eye exams.  Stay current with your immunizations.   Do not use any  tobacco products including cigarettes, chewing tobacco, or electronic cigarettes.  If you are pregnant, do not drink alcohol.  If you are breastfeeding, limit how much and how often you drink alcohol.  Limit alcohol intake to no more than 1 drink per  day for nonpregnant women. One drink equals 12 ounces of beer, 5 ounces of wine, or 1 ounces of hard liquor.  Do not use street drugs.  Do not share needles.  Ask your health care provider for help if you need support or information about quitting drugs.  Tell your health care provider if you often feel depressed.  Tell your health care provider if you have ever been abused or do not feel safe at home.   This information is not intended to replace advice given to you by your health care provider. Make sure you discuss any questions you have with your health care provider.   Document Released: 11/18/2010 Document Revised: 05/26/2014 Document Reviewed: 04/06/2013 Elsevier Interactive Patient Education 2016 [place] K. Panosh M.D.

## 2015-06-28 ENCOUNTER — Ambulatory Visit (INDEPENDENT_AMBULATORY_CARE_PROVIDER_SITE_OTHER): Payer: BC Managed Care – PPO | Admitting: Psychology

## 2015-06-28 DIAGNOSIS — F411 Generalized anxiety disorder: Secondary | ICD-10-CM

## 2015-07-12 ENCOUNTER — Ambulatory Visit (INDEPENDENT_AMBULATORY_CARE_PROVIDER_SITE_OTHER): Payer: BC Managed Care – PPO | Admitting: Psychology

## 2015-07-12 DIAGNOSIS — F411 Generalized anxiety disorder: Secondary | ICD-10-CM | POA: Diagnosis not present

## 2015-07-17 ENCOUNTER — Encounter: Payer: Self-pay | Admitting: Internal Medicine

## 2015-07-17 NOTE — Progress Notes (Signed)
Document opened and reviewed for OV but appt  canceled same day .  

## 2015-07-20 ENCOUNTER — Ambulatory Visit (INDEPENDENT_AMBULATORY_CARE_PROVIDER_SITE_OTHER): Payer: BC Managed Care – PPO | Admitting: Internal Medicine

## 2015-07-20 ENCOUNTER — Encounter: Payer: Self-pay | Admitting: Internal Medicine

## 2015-07-20 VITALS — BP 106/66 | Temp 97.6°F | Wt 163.5 lb

## 2015-07-20 DIAGNOSIS — Z79899 Other long term (current) drug therapy: Secondary | ICD-10-CM

## 2015-07-20 DIAGNOSIS — J988 Other specified respiratory disorders: Secondary | ICD-10-CM

## 2015-07-20 DIAGNOSIS — J22 Unspecified acute lower respiratory infection: Secondary | ICD-10-CM

## 2015-07-20 DIAGNOSIS — Z79631 Long term (current) use of antimetabolite agent: Secondary | ICD-10-CM

## 2015-07-20 NOTE — Progress Notes (Signed)
Pre visit review using our clinic review tool, if applicable. No additional management support is needed unless otherwise documented below in the visit note.  Chief Complaint  Patient presents with  . Cough    X1 WK  . Headache  . Nasal Congestion    HPI: Patient [name]  comes in today for SDA for  new problem evaluation.  Onset  A week  And upper  Congestion Worry about bronchitis .  otc med .   soe head ache at night  husbadn has been sick  But feeling better   Today   No fever dry cough  . No hemoptysis right ear clogged feeling  No face tenderness Holding the mtx  ROS: See pertinent positives and negatives per HPI.  Past Medical History  Diagnosis Date  . Chest pain, unspecified   . Palpitations   . Personal history of venous thrombosis and embolism   . Chronic anticoagulation   . Adjustment disorder with anxiety   . Other and unspecified hyperlipidemia   . B12 deficiency due to diet     is a vegetarian  . Unspecified vitamin D deficiency   . Otalgia, unspecified   . Iron deficiency anemia, unspecified   . Rheumatoid arthritis(714.0)     deveshewar  . Headache(784.0)     migraines and head pain  . Hx of varicella     As Child  . UTI (lower urinary tract infection) 11/28/2011    citrobacter       Family History  Problem Relation Age of Onset  . Dementia Father   . Stroke Father   . Hyperlipidemia Father   . Diabetes Father   . Parkinsonism Mother   . Lymphoma      Social History   Social History  . Marital Status: Married    Spouse Name: N/A  . Number of Children: N/A  . Years of Education: N/A   Social History Main Topics  . Smoking status: Never Smoker   . Smokeless tobacco: Never Used  . Alcohol Use: No  . Drug Use: No  . Sexual Activity: Not Asked   Other Topics Concern  . None   Social History Narrative   Regular Exercise-no   20 yrs in [place]   Son at [employer]   From [place].   Married HH  of 3    G1P1              Outpatient Prescriptions Prior to Visit  Medication Sig Dispense Refill  . alendronate (FOSAMAX) 70 MG tablet once a week.     . Calcium Carb-Cholecalciferol (CALTRATE 600+D) 600-800 MG-UNIT TABS Take by mouth.    . Cholecalciferol (VITAMIN D3) 2000 UNITS TABS Take 1 tablet by mouth daily.    . cycloSPORINE (RESTASIS) 0.05 % ophthalmic emulsion Place 1 drop into both eyes daily.      . folic acid (FOLVITE) 1 MG tablet Take 1 mg by mouth 2 (two) times daily.      . hydroxychloroquine (PLAQUENIL) 200 MG tablet Take 200 mg by mouth 2 (two) times daily.      . methotrexate (RHEUMATREX) 2.5 MG tablet Take 20 mg by mouth once a week.     . mineral/vitamin supplement (MULTIGEN) 70 MG TABS tablet TAKE ONE TABLET BY MOUTH ONCE DAILY 30 tablet 5  . NASCOBAL 500 MCG/0.1ML SOLN USE 50 MCG AS DIRECTED INTO THE NOSE 8 Bottle 5  . predniSONE (DELTASONE) 1 MG tablet daily. 1.5 mg daily    .  SUMAtriptan (IMITREX) 50 MG tablet TAKE ONE TABLET BY MOUTH AS NEEDED. MAY REPEAT IN 2 TO 4 HOURS IF NEEDED. 9 tablet 5  . urea (CARMOL) 40 % CREA APPLY  CREAM TOPICALLY EVERY DAY 29 each 1  . warfarin (COUMADIN) 2.5 MG tablet Take as directed by coumadin clinic 150 tablet 3  . XANAX 0.25 MG tablet TAKE ONE TABLET BY MOUTH AT BEDTIME AS NEEDED FOR ANXIETY FOR SLEEP 60 tablet 1   No facility-administered medications prior to visit.     EXAM:  BP 106/66 mmHg  Temp(Src) 97.6 F (36.4 C) (Oral)  Wt 163 lb 8 oz (74.163 kg)  Body mass index is 29.41 kg/(m^2). WDWN in NAD  quiet respirations; mildly congested  somewhat hoarse. Non toxic . HEENT: Normocephalic ;atraumatic , Eyes;  PERRL, EOMs  Full, lids and conjunctiva clear,,Ears: no deformities, canals nl, TM landmarks normal, Nose: no deformity  Mild mucoid discharge but congested;face non tender Mouth : OP clear without lesion or edema . Neck: Supple without adenopathy or masses or bruits Chest:  Clear to A without wheezes rales or rhonchi CV:  S1-S2 no  gallops or murmurs peripheral perfusion is normal Skin :nl perfusion and no acute rashes   Points to  Are right ant shim bulids tint no mass no  Active bruise and no tendness    ASSESSMENT AND PLAN:  Discussed the following assessment and plan:  Acute respiratory infection - viral but at risk  if sinus pain  may add augmentin   contadt [place] next week  no sx pna today   On methotrexate therapy  Low threshold to add antibiotic  For secondary infection Area on leg is prob clin insig  Will follow  -Patient advised to return or notify health care team  if symptoms worsen ,persist or new concerns arise.  Patient Instructions  This  Is a viral respiratory infection .  Ok to use mucinex  Dm for cough    Afrin for nose for now more than 3 days in a row .  Contact us  After weekend in not continuing to improve contact us .   Or pain in face .   We may have you  Fill the antibiotic med augmentin that we gave you in February .     Upper Respiratory Infection, Adult Most upper respiratory infections (URIs) are a viral infection of the air passages leading to the lungs. A URI affects the nose, throat, and upper air passages. The most common type of URI is nasopharyngitis and is typically referred to as "the common cold." URIs run their course and usually go away on their own. Most of the time, a URI does not require medical attention, but sometimes a bacterial infection in the upper airways can follow a viral infection. This is called a secondary infection. Sinus and middle ear infections are common types of secondary upper respiratory infections. Bacterial pneumonia can also complicate a URI. A URI can worsen asthma and chronic obstructive pulmonary disease (COPD). Sometimes, these complications can require emergency medical care and may be life threatening.  CAUSES Almost all URIs are caused by viruses. A virus is a type of germ and can spread from one person to another.  RISKS FACTORS You may be at  risk for a URI if:   You smoke.   You have chronic heart or lung disease.  You have a weakened defense (immune) system.   You are very young or very old.   You have  nasal allergies or asthma.  You work in crowded or poorly ventilated areas.  You work in health care facilities or schools. SIGNS AND SYMPTOMS  Symptoms typically develop 2-3 days after you come in contact with a cold virus. Most viral URIs last 7-10 days. However, viral URIs from the influenza virus (flu virus) can last 14-18 days and are typically more severe. Symptoms may include:   Runny or stuffy (congested) nose.   Sneezing.   Cough.   Sore throat.   Headache.   Fatigue.   Fever.   Loss of appetite.   Pain in your forehead, behind your eyes, and over your cheekbones (sinus pain).  Muscle aches.  DIAGNOSIS  Your health care provider may diagnose a URI by:  Physical exam.  Tests to check that your symptoms are not due to another condition such as:  Strep throat.  Sinusitis.  Pneumonia.  Asthma. TREATMENT  A URI goes away on its own with time. It cannot be cured with medicines, but medicines may be prescribed or recommended to relieve symptoms. Medicines may help:  Reduce your fever.  Reduce your cough.  Relieve nasal congestion. HOME CARE INSTRUCTIONS   Take medicines only as directed by your health care provider.   Gargle warm saltwater or take cough drops to comfort your throat as directed by your health care provider.  Use a warm mist humidifier or inhale steam from a shower to increase air moisture. This may make it easier to breathe.  Drink enough fluid to keep your urine clear or pale yellow.   Eat soups and other clear broths and maintain good nutrition.   Rest as needed.   Return to work when your temperature has returned to normal or as your health care provider advises. You may need to stay home longer to avoid infecting others. You can also use a face  mask and careful hand washing to prevent spread of the virus.  Increase the usage of your inhaler if you have asthma.   Do not use any tobacco products, including cigarettes, chewing tobacco, or electronic cigarettes. If you need help quitting, ask your health care provider. PREVENTION  The best way to protect yourself from getting a cold is to practice good hygiene.   Avoid oral or hand contact with people with cold symptoms.   Wash your hands often if contact occurs.  There is no clear evidence that vitamin C, vitamin E, echinacea, or exercise reduces the chance of developing a cold. However, it is always recommended to get plenty of rest, exercise, and practice good nutrition.  SEEK MEDICAL CARE IF:   You are getting worse rather than better.   Your symptoms are not controlled by medicine.   You have chills.  You have worsening shortness of breath.  You have brown or red mucus.  You have yellow or brown nasal discharge.  You have pain in your face, especially when you bend forward.  You have a fever.  You have swollen neck glands.  You have pain while swallowing.  You have white areas in the back of your throat. SEEK IMMEDIATE MEDICAL CARE IF:   You have severe or persistent:  Headache.  Ear pain.  Sinus pain.  Chest pain.  You have chronic lung disease and any of the following:  Wheezing.  Prolonged cough.  Coughing up blood.  A change in your usual mucus.  You have a stiff neck.  You have changes in your:  Vision.  Hearing.  Thinking.  Mood. MAKE SURE YOU:   Understand these instructions.  Will watch your condition.  Will get help right away if you are not doing well or get worse.   This information is not intended to replace advice given to you by your health care provider. Make sure you discuss any questions you have with your health care provider.   Document Released: 10/29/2000 Document Revised: 09/19/2014 Document Reviewed:  08/10/2013 Elsevier Interactive Patient Education 2016 [place] K. Nayzeth Altman M.D.

## 2015-07-20 NOTE — Patient Instructions (Signed)
This  Is a viral respiratory infection .  Ok to use mucinex  Dm for cough    Afrin for nose for now more than 3 days in a row .  Contact us  After weekend in not continuing to improve contact us .   Or pain in face .   We may have you  Fill the antibiotic med augmentin that we gave you in February .     Upper Respiratory Infection, Adult Most upper respiratory infections (URIs) are a viral infection of the air passages leading to the lungs. A URI affects the nose, throat, and upper air passages. The most common type of URI is nasopharyngitis and is typically referred to as "the common cold." URIs run their course and usually go away on their own. Most of the time, a URI does not require medical attention, but sometimes a bacterial infection in the upper airways can follow a viral infection. This is called a secondary infection. Sinus and middle ear infections are common types of secondary upper respiratory infections. Bacterial pneumonia can also complicate a URI. A URI can worsen asthma and chronic obstructive pulmonary disease (COPD). Sometimes, these complications can require emergency medical care and may be life threatening.  CAUSES Almost all URIs are caused by viruses. A virus is a type of germ and can spread from one person to another.  RISKS FACTORS You may be at risk for a URI if:   You smoke.   You have chronic heart or lung disease.  You have a weakened defense (immune) system.   You are very young or very old.   You have nasal allergies or asthma.  You work in crowded or poorly ventilated areas.  You work in health care facilities or schools. SIGNS AND SYMPTOMS  Symptoms typically develop 2-3 days after you come in contact with a cold virus. Most viral URIs last 7-10 days. However, viral URIs from the influenza virus (flu virus) can last 14-18 days and are typically more severe. Symptoms may include:   Runny or stuffy (congested) nose.   Sneezing.   Cough.    Sore throat.   Headache.   Fatigue.   Fever.   Loss of appetite.   Pain in your forehead, behind your eyes, and over your cheekbones (sinus pain).  Muscle aches.  DIAGNOSIS  Your health care provider may diagnose a URI by:  Physical exam.  Tests to check that your symptoms are not due to another condition such as:  Strep throat.  Sinusitis.  Pneumonia.  Asthma. TREATMENT  A URI goes away on its own with time. It cannot be cured with medicines, but medicines may be prescribed or recommended to relieve symptoms. Medicines may help:  Reduce your fever.  Reduce your cough.  Relieve nasal congestion. HOME CARE INSTRUCTIONS   Take medicines only as directed by your health care provider.   Gargle warm saltwater or take cough drops to comfort your throat as directed by your health care provider.  Use a warm mist humidifier or inhale steam from a shower to increase air moisture. This may make it easier to breathe.  Drink enough fluid to keep your urine clear or pale yellow.   Eat soups and other clear broths and maintain good nutrition.   Rest as needed.   Return to work when your temperature has returned to normal or as your health care provider advises. You may need to stay home longer to avoid infecting others. You can also use  a face mask and careful hand washing to prevent spread of the virus.  Increase the usage of your inhaler if you have asthma.   Do not use any tobacco products, including cigarettes, chewing tobacco, or electronic cigarettes. If you need help quitting, ask your health care provider. PREVENTION  The best way to protect yourself from getting a cold is to practice good hygiene.   Avoid oral or hand contact with people with cold symptoms.   Wash your hands often if contact occurs.  There is no clear evidence that vitamin C, vitamin E, echinacea, or exercise reduces the chance of developing a cold. However, it is always  recommended to get plenty of rest, exercise, and practice good nutrition.  SEEK MEDICAL CARE IF:   You are getting worse rather than better.   Your symptoms are not controlled by medicine.   You have chills.  You have worsening shortness of breath.  You have brown or red mucus.  You have yellow or brown nasal discharge.  You have pain in your face, especially when you bend forward.  You have a fever.  You have swollen neck glands.  You have pain while swallowing.  You have white areas in the back of your throat. SEEK IMMEDIATE MEDICAL CARE IF:   You have severe or persistent:  Headache.  Ear pain.  Sinus pain.  Chest pain.  You have chronic lung disease and any of the following:  Wheezing.  Prolonged cough.  Coughing up blood.  A change in your usual mucus.  You have a stiff neck.  You have changes in your:  Vision.  Hearing.  Thinking.  Mood. MAKE SURE YOU:   Understand these instructions.  Will watch your condition.  Will get help right away if you are not doing well or get worse.   This information is not intended to replace advice given to you by your health care provider. Make sure you discuss any questions you have with your health care provider.   Document Released: 10/29/2000 Document Revised: 09/19/2014 Document Reviewed: 08/10/2013 Elsevier Interactive Patient Education [employer].

## 2015-07-25 ENCOUNTER — Ambulatory Visit (INDEPENDENT_AMBULATORY_CARE_PROVIDER_SITE_OTHER): Payer: BC Managed Care – PPO | Admitting: General Practice

## 2015-07-25 DIAGNOSIS — R894 Abnormal immunological findings in specimens from other organs, systems and tissues: Secondary | ICD-10-CM

## 2015-07-25 DIAGNOSIS — Z5181 Encounter for therapeutic drug level monitoring: Secondary | ICD-10-CM

## 2015-07-25 DIAGNOSIS — R760 Raised antibody titer: Secondary | ICD-10-CM

## 2015-07-25 LAB — POCT INR: INR: 1.9

## 2015-07-25 NOTE — Progress Notes (Signed)
Pre visit review using our clinic review tool, if applicable. No additional management support is needed unless otherwise documented below in the visit note. 

## 2015-08-03 ENCOUNTER — Ambulatory Visit: Payer: BC Managed Care – PPO | Admitting: Psychology

## 2015-08-09 ENCOUNTER — Ambulatory Visit (INDEPENDENT_AMBULATORY_CARE_PROVIDER_SITE_OTHER): Payer: BC Managed Care – PPO | Admitting: Psychology

## 2015-08-09 DIAGNOSIS — F411 Generalized anxiety disorder: Secondary | ICD-10-CM

## 2015-08-22 ENCOUNTER — Ambulatory Visit (INDEPENDENT_AMBULATORY_CARE_PROVIDER_SITE_OTHER): Payer: BC Managed Care – PPO | Admitting: General Practice

## 2015-08-22 DIAGNOSIS — Z5181 Encounter for therapeutic drug level monitoring: Secondary | ICD-10-CM

## 2015-08-22 DIAGNOSIS — R894 Abnormal immunological findings in specimens from other organs, systems and tissues: Secondary | ICD-10-CM | POA: Diagnosis not present

## 2015-08-22 DIAGNOSIS — R760 Raised antibody titer: Secondary | ICD-10-CM

## 2015-08-22 LAB — POCT INR: INR: 1.4

## 2015-08-22 NOTE — Progress Notes (Signed)
Pre visit review using our clinic review tool, if applicable. No additional management support is needed unless otherwise documented below in the visit note. 

## 2015-08-23 ENCOUNTER — Ambulatory Visit (INDEPENDENT_AMBULATORY_CARE_PROVIDER_SITE_OTHER): Payer: BC Managed Care – PPO | Admitting: Psychology

## 2015-08-23 DIAGNOSIS — F411 Generalized anxiety disorder: Secondary | ICD-10-CM | POA: Diagnosis not present

## 2015-09-06 ENCOUNTER — Ambulatory Visit (INDEPENDENT_AMBULATORY_CARE_PROVIDER_SITE_OTHER): Payer: BC Managed Care – PPO | Admitting: Psychology

## 2015-09-06 DIAGNOSIS — F411 Generalized anxiety disorder: Secondary | ICD-10-CM

## 2015-09-19 ENCOUNTER — Ambulatory Visit (INDEPENDENT_AMBULATORY_CARE_PROVIDER_SITE_OTHER): Payer: BC Managed Care – PPO | Admitting: General Practice

## 2015-09-19 DIAGNOSIS — R894 Abnormal immunological findings in specimens from other organs, systems and tissues: Secondary | ICD-10-CM | POA: Diagnosis not present

## 2015-09-19 DIAGNOSIS — R760 Raised antibody titer: Secondary | ICD-10-CM

## 2015-09-19 DIAGNOSIS — Z5181 Encounter for therapeutic drug level monitoring: Secondary | ICD-10-CM | POA: Diagnosis not present

## 2015-09-19 LAB — POCT INR: INR: 1.6

## 2015-09-19 NOTE — Progress Notes (Signed)
Pre visit review using our clinic review tool, if applicable. No additional management support is needed unless otherwise documented below in the visit note. 

## 2015-09-20 ENCOUNTER — Ambulatory Visit: Payer: BC Managed Care – PPO | Admitting: Psychology

## 2015-10-04 ENCOUNTER — Ambulatory Visit (INDEPENDENT_AMBULATORY_CARE_PROVIDER_SITE_OTHER): Payer: BC Managed Care – PPO | Admitting: Psychology

## 2015-10-04 DIAGNOSIS — F411 Generalized anxiety disorder: Secondary | ICD-10-CM

## 2015-10-16 ENCOUNTER — Telehealth: Payer: Self-pay | Admitting: Internal Medicine

## 2015-10-16 ENCOUNTER — Telehealth: Payer: Self-pay | Admitting: General Practice

## 2015-10-16 ENCOUNTER — Other Ambulatory Visit: Payer: Self-pay | Admitting: Internal Medicine

## 2015-10-16 NOTE — Telephone Encounter (Signed)
Ok to refill x 1  (#60)

## 2015-10-16 NOTE — Telephone Encounter (Signed)
Xanex 0.25 mg tablets phoned to [facility] on [address].

## 2015-10-17 ENCOUNTER — Ambulatory Visit (INDEPENDENT_AMBULATORY_CARE_PROVIDER_SITE_OTHER): Payer: BC Managed Care – PPO | Admitting: General Practice

## 2015-10-17 DIAGNOSIS — Z5181 Encounter for therapeutic drug level monitoring: Secondary | ICD-10-CM

## 2015-10-17 DIAGNOSIS — R894 Abnormal immunological findings in specimens from other organs, systems and tissues: Secondary | ICD-10-CM

## 2015-10-17 DIAGNOSIS — R760 Raised antibody titer: Secondary | ICD-10-CM

## 2015-10-17 LAB — POCT INR: INR: 1.5

## 2015-10-17 NOTE — Telephone Encounter (Signed)
I answered this yesterday  Ok x 1

## 2015-10-17 NOTE — Progress Notes (Signed)
Pre visit review using our clinic review tool, if applicable. No additional management support is needed unless otherwise documented below in the visit note. 

## 2015-10-18 ENCOUNTER — Ambulatory Visit: Payer: BC Managed Care – PPO | Admitting: Psychology

## 2015-10-18 NOTE — Telephone Encounter (Signed)
Villa Herb has called in medication to the pharmacy.

## 2015-11-01 ENCOUNTER — Ambulatory Visit (INDEPENDENT_AMBULATORY_CARE_PROVIDER_SITE_OTHER): Payer: BC Managed Care – PPO | Admitting: Psychology

## 2015-11-01 DIAGNOSIS — F411 Generalized anxiety disorder: Secondary | ICD-10-CM

## 2015-11-02 ENCOUNTER — Ambulatory Visit
Admission: RE | Admit: 2015-11-02 | Discharge: 2015-11-02 | Disposition: A | Discharge status: Home / Self Care | Payer: BC Managed Care – PPO | Source: Ambulatory Visit | Attending: Internal Medicine | Admitting: Internal Medicine

## 2015-11-02 ENCOUNTER — Ambulatory Visit (INDEPENDENT_AMBULATORY_CARE_PROVIDER_SITE_OTHER): Payer: BC Managed Care – PPO | Admitting: Internal Medicine

## 2015-11-02 ENCOUNTER — Encounter: Payer: Self-pay | Admitting: Internal Medicine

## 2015-11-02 VITALS — BP 118/78 | HR 62 | Temp 98.1°F | Wt 161.0 lb

## 2015-11-02 DIAGNOSIS — Z87898 Personal history of other specified conditions: Secondary | ICD-10-CM | POA: Diagnosis not present

## 2015-11-02 DIAGNOSIS — R05 Cough: Secondary | ICD-10-CM

## 2015-11-02 DIAGNOSIS — R053 Chronic cough: Secondary | ICD-10-CM

## 2015-11-02 DIAGNOSIS — Z87440 Personal history of urinary (tract) infections: Secondary | ICD-10-CM

## 2015-11-02 DIAGNOSIS — Z789 Other specified health status: Secondary | ICD-10-CM

## 2015-11-02 DIAGNOSIS — Z7901 Long term (current) use of anticoagulants: Secondary | ICD-10-CM | POA: Diagnosis not present

## 2015-11-02 MED ORDER — CIPROFLOXACIN HCL 500 MG PO TABS: 500.0000 mg | ORAL_TABLET | Freq: Two times a day (BID) | ORAL | Status: DC

## 2015-11-02 NOTE — Progress Notes (Signed)
Pre visit review using our clinic review tool, if applicable. No additional management support is needed unless otherwise documented below in the visit note.  Chief Complaint  Patient presents with  . Cough    Cough is dry.  Ongoing for 5-6 weeks    HPI: [name] 55 y.o.  sda   For  cough    Had uri cough in  March  Got totally better   Since then has had new  Problem with cough  Self treating without much head cold no fver sob  Seems to be deep and dry  To travel to [place] pain in 10 days .  No change in status arthritis    On meds  No sob wheezing hemoptysis and on anticoagulation.   Tea  Honey  And comes and goes .  And getting worse. Trigger by brushing and water .  Talking  Comes deep  . Dry .  5 - 6 weeks  Of some  Comes  Deep   also asks for  rx for antibiotic incase gets uti on travel trip . Hx of same  Has had good response with cipro in the past  ROS: See pertinent positives and negatives per HPI.  Past Medical History  Diagnosis Date  . Chest pain, unspecified   . Palpitations   . Personal history of venous thrombosis and embolism   . Chronic anticoagulation   . Adjustment disorder with anxiety   . Other and unspecified hyperlipidemia   . B12 deficiency due to diet     is a vegetarian  . Unspecified vitamin D deficiency   . Otalgia, unspecified   . Iron deficiency anemia, unspecified   . Rheumatoid arthritis(714.0)     deveshewar  . Headache(784.0)     migraines and head pain  . Hx of varicella     As Child  . UTI (lower urinary tract infection) 11/28/2011    citrobacter       Family History  Problem Relation Age of Onset  . Dementia Father   . Stroke Father   . Hyperlipidemia Father   . Diabetes Father   . Parkinsonism Mother   . Lymphoma      Social History   Social History  . Marital Status: Married    Spouse Name: N/A  . Number of Children: N/A  . Years of Education: N/A   Social History Main Topics  . Smoking status: Never Smoker     . Smokeless tobacco: Never Used  . Alcohol Use: No  . Drug Use: No  . Sexual Activity: Not Asked   Other Topics Concern  . None   Social History Narrative   Regular Exercise-no   20 yrs in [place]   Son at [employer] [employer]   From [place].   Married HH  of 3    G1P1             Outpatient Prescriptions Prior to Visit  Medication Sig Dispense Refill  . alendronate (FOSAMAX) 70 MG tablet once a week.     . Calcium Carb-Cholecalciferol (CALTRATE 600+D) 600-800 MG-UNIT TABS Take by mouth.    . Cholecalciferol (VITAMIN D3) 2000 UNITS TABS Take 1 tablet by mouth daily.    . cycloSPORINE (RESTASIS) 0.05 % ophthalmic emulsion Place 1 drop into both eyes daily.      . folic acid (FOLVITE) 1 MG tablet Take 1 mg by mouth 2 (two) times daily.      . hydroxychloroquine (PLAQUENIL) 200 MG tablet  Take 200 mg by mouth 2 (two) times daily.      . methotrexate (RHEUMATREX) 2.5 MG tablet Take 20 mg by mouth once a week.     . mineral/vitamin supplement (MULTIGEN) 70 MG TABS tablet TAKE ONE TABLET BY MOUTH ONCE DAILY 30 tablet 5  . NASCOBAL 500 MCG/0.1ML SOLN USE 50 MCG AS DIRECTED INTO THE NOSE 8 Bottle 5  . predniSONE (DELTASONE) 1 MG tablet daily. 1.5 mg daily    . SUMAtriptan (IMITREX) 50 MG tablet TAKE ONE TABLET BY MOUTH AS NEEDED. MAY REPEAT IN 2 TO 4 HOURS IF NEEDED. 9 tablet 5  . urea (CARMOL) 40 % CREA APPLY  CREAM TOPICALLY EVERY DAY 29 each 1  . warfarin (COUMADIN) 2.5 MG tablet Take as directed by coumadin clinic 150 tablet 3  . XANAX 0.25 MG tablet TAKE ONE TABLET BY MOUTH AT BEDTIME AS NEEDED FOR ANXIETY FOR SLEEP. 60 tablet 0   No facility-administered medications prior to visit.     EXAM:  BP 118/78 mmHg  Pulse 62  Temp(Src) 98.1 F (36.7 C) (Oral)  Wt 161 lb (73.029 kg)  SpO2 98%  Body mass index is 28.96 kg/(m^2).  GENERAL: vitals reviewed and listed above, alert, oriented, appears well hydrated and in no acute distress ocass cough bronchial sounding  HEENT: atraumatic,  conjunctiva  clear, no obvious abnormalities on inspection of external nose and ears tms nad OP : no lesion edema or exudate nares min congestion NECK: no obvious masses on inspection palpation  LUNGS: clear to auscultation bilaterally, no wheezes, rales or rhonchi, good air movement CV: HRRR, no clubbing cyanosis or  peripheral edema nl cap refill  MS: moves all extremities without noticeable focal  abnormality PSYCH: pleasant and cooperative, no obvious depression or anxiety  ASSESSMENT AND PLAN:  Discussed the following assessment and plan:  Cough, persistent - Plan: DG Chest 2 View  Medically complex patient  Long term (current) use of anticoagulants  History of recurrent UTI (urinary tract infection) - rx geiven to take on travel has se of many meds risk benefit discussed  High risk underlying conditions   But exam reassuring   Get c xray and follow consider emepritic antibiotic if consideration of infection . Seems deeper than upper airway cough  Can try dm mucinex dm deslym call for tessalone perles if wants this   -Patient advised to return or notify health care team  if symptoms worsen ,persist or new concerns arise.   Expectant management.   Patient Instructions  Get x ray   To make sure no  pna or such . will let you know results before end of day.    Cough, Adult Coughing is a reflex that clears your throat and your airways. Coughing helps to heal and protect your lungs. It is normal to cough occasionally, but a cough that happens with other symptoms or lasts a long time may be a sign of a condition that needs treatment. A cough may last only 2-3 weeks (acute), or it may last longer than 8 weeks (chronic). CAUSES Coughing is commonly caused by:  Breathing in substances that irritate your lungs.  A viral or bacterial respiratory infection.  Allergies.  Asthma.  Postnasal drip.  Smoking.  Acid backing up from the stomach into the esophagus (gastroesophageal  reflux).  Certain medicines.  Chronic lung problems, including COPD (or rarely, lung cancer).  Other medical conditions such as heart failure. HOME CARE INSTRUCTIONS  Pay attention to any changes in  your symptoms. Take these actions to help with your discomfort:  Take medicines only as told by your health care provider.  If you were prescribed an antibiotic medicine, take it as told by your health care provider. Do not stop taking the antibiotic even if you start to feel better.  Talk with your health care provider before you take a cough suppressant medicine.  Drink enough fluid to keep your urine clear or pale yellow.  If the air is dry, use a cold steam vaporizer or humidifier in your bedroom or your home to help loosen secretions.  Avoid anything that causes you to cough at work or at home.  If your cough is worse at night, try sleeping in a semi-upright position.  Avoid cigarette smoke. If you smoke, quit smoking. If you need help quitting, ask your health care provider.  Avoid caffeine.  Avoid alcohol.  Rest as needed. SEEK MEDICAL CARE IF:   You have new symptoms.  You cough up pus.  Your cough does not get better after 2-3 weeks, or your cough gets worse.  You cannot control your cough with suppressant medicines and you are losing sleep.  You develop pain that is getting worse or pain that is not controlled with pain medicines.  You have a fever.  You have unexplained weight loss.  You have night sweats. SEEK IMMEDIATE MEDICAL CARE IF:  You cough up blood.  You have difficulty breathing.  Your heartbeat is very fast.   This information is not intended to replace advice given to you by your health care provider. Make sure you discuss any questions you have with your health care provider.   Document Released: 11/01/2010 Document Revised: 01/24/2015 Document Reviewed: 07/12/2014 Elsevier Interactive Patient Education 2016 [place]  K. Panosh M.D.

## 2015-11-02 NOTE — Patient Instructions (Signed)
Get x ray   To make sure no  pna or such . will let you know results before end of day.    Cough, Adult Coughing is a reflex that clears your throat and your airways. Coughing helps to heal and protect your lungs. It is normal to cough occasionally, but a cough that happens with other symptoms or lasts a long time may be a sign of a condition that needs treatment. A cough may last only 2-3 weeks (acute), or it may last longer than 8 weeks (chronic). CAUSES Coughing is commonly caused by:  Breathing in substances that irritate your lungs.  A viral or bacterial respiratory infection.  Allergies.  Asthma.  Postnasal drip.  Smoking.  Acid backing up from the stomach into the esophagus (gastroesophageal reflux).  Certain medicines.  Chronic lung problems, including COPD (or rarely, lung cancer).  Other medical conditions such as heart failure. HOME CARE INSTRUCTIONS  Pay attention to any changes in your symptoms. Take these actions to help with your discomfort:  Take medicines only as told by your health care provider.  If you were prescribed an antibiotic medicine, take it as told by your health care provider. Do not stop taking the antibiotic even if you start to feel better.  Talk with your health care provider before you take a cough suppressant medicine.  Drink enough fluid to keep your urine clear or pale yellow.  If the air is dry, use a cold steam vaporizer or humidifier in your bedroom or your home to help loosen secretions.  Avoid anything that causes you to cough at work or at home.  If your cough is worse at night, try sleeping in a semi-upright position.  Avoid cigarette smoke. If you smoke, quit smoking. If you need help quitting, ask your health care provider.  Avoid caffeine.  Avoid alcohol.  Rest as needed. SEEK MEDICAL CARE IF:   You have new symptoms.  You cough up pus.  Your cough does not get better after 2-3 weeks, or your cough gets  worse.  You cannot control your cough with suppressant medicines and you are losing sleep.  You develop pain that is getting worse or pain that is not controlled with pain medicines.  You have a fever.  You have unexplained weight loss.  You have night sweats. SEEK IMMEDIATE MEDICAL CARE IF:  You cough up blood.  You have difficulty breathing.  Your heartbeat is very fast.   This information is not intended to replace advice given to you by your health care provider. Make sure you discuss any questions you have with your health care provider.   Document Released: 11/01/2010 Document Revised: 01/24/2015 Document Reviewed: 07/12/2014 Elsevier Interactive Patient Education [employer].

## 2015-11-05 ENCOUNTER — Encounter: Payer: Self-pay | Admitting: Internal Medicine

## 2015-11-05 ENCOUNTER — Ambulatory Visit (INDEPENDENT_AMBULATORY_CARE_PROVIDER_SITE_OTHER): Payer: BC Managed Care – PPO | Admitting: Internal Medicine

## 2015-11-05 VITALS — BP 108/70 | Temp 97.6°F | Wt 160.5 lb

## 2015-11-05 DIAGNOSIS — N3000 Acute cystitis without hematuria: Secondary | ICD-10-CM | POA: Diagnosis not present

## 2015-11-05 DIAGNOSIS — Z87440 Personal history of urinary (tract) infections: Secondary | ICD-10-CM

## 2015-11-05 DIAGNOSIS — R300 Dysuria: Secondary | ICD-10-CM

## 2015-11-05 DIAGNOSIS — N390 Urinary tract infection, site not specified: Secondary | ICD-10-CM | POA: Diagnosis not present

## 2015-11-05 DIAGNOSIS — R82998 Other abnormal findings in urine: Secondary | ICD-10-CM

## 2015-11-05 LAB — POC URINALSYSI DIPSTICK (AUTOMATED)
Bilirubin, UA: NEGATIVE
Blood, UA: 3
Glucose, UA: NEGATIVE
KETONES UA: NEGATIVE
NITRITE UA: NEGATIVE
PH UA: 6
PROTEIN UA: NEGATIVE
Spec Grav, UA: 1.025
UROBILINOGEN UA: 0.2

## 2015-11-05 MED ORDER — CIPROFLOXACIN HCL 500 MG PO TABS: 500.0000 mg | ORAL_TABLET | Freq: Two times a day (BID) | ORAL | Status: DC

## 2015-11-05 NOTE — Patient Instructions (Signed)
You have a uti  And begin the cipro usually 5 days if enough but can take 7 days if needed.   Culture results should be back this week .  Contact us if not getting better   \ok to use otc azo for short term pain.

## 2015-11-05 NOTE — Progress Notes (Signed)
Pre visit review using our clinic review tool, if applicable. No additional management support is needed unless otherwise documented below in the visit note.  Chief Complaint  Patient presents with  . Dysuria    HPI: [name] 55 y.o.  Comes  infor sda   Anew problem began 26 hours ago with dysuria no fever  . Like a uti  ( says gets more often when on  Mtx)  No fever  Vomiting has azo at home .  ROS: See pertinent positives and negatives per HPI.  Past Medical History  Diagnosis Date  . Chest pain, unspecified   . Palpitations   . Personal history of venous thrombosis and embolism   . Chronic anticoagulation   . Adjustment disorder with anxiety   . Other and unspecified hyperlipidemia   . B12 deficiency due to diet     is a vegetarian  . Unspecified vitamin D deficiency   . Otalgia, unspecified   . Iron deficiency anemia, unspecified   . Rheumatoid arthritis(714.0)     deveshewar  . Headache(784.0)     migraines and head pain  . Hx of varicella     As Child  . UTI (lower urinary tract infection) 11/28/2011    citrobacter       Family History  Problem Relation Age of Onset  . Dementia Father   . Stroke Father   . Hyperlipidemia Father   . Diabetes Father   . Parkinsonism Mother   . Lymphoma      Social History   Social History  . Marital Status: Married    Spouse Name: N/A  . Number of Children: N/A  . Years of Education: N/A   Social History Main Topics  . Smoking status: Never Smoker   . Smokeless tobacco: Never Used  . Alcohol Use: No  . Drug Use: No  . Sexual Activity: Not Asked   Other Topics Concern  . None   Social History Narrative   Regular Exercise-no   20 yrs in [place]   Son at [employer] [facility] [employer] [facility]   From [place].   Married HH  of 3    G1P1             Outpatient Prescriptions Prior to Visit  Medication Sig Dispense Refill  . alendronate (FOSAMAX) 70 MG tablet once a week.     . Calcium Carb-Cholecalciferol (CALTRATE 600+D) 600-800  MG-UNIT TABS Take by mouth.    . Cholecalciferol (VITAMIN D3) 2000 UNITS TABS Take 1 tablet by mouth daily.    . ciprofloxacin (CIPRO) 500 MG tablet Take 1 tablet (500 mg total) by mouth 2 (two) times daily. 10 tablet 0  . cycloSPORINE (RESTASIS) 0.05 % ophthalmic emulsion Place 1 drop into both eyes daily.      . folic acid (FOLVITE) 1 MG tablet Take 1 mg by mouth 2 (two) times daily.      . hydroxychloroquine (PLAQUENIL) 200 MG tablet Take 200 mg by mouth 2 (two) times daily.      . methotrexate (RHEUMATREX) 2.5 MG tablet Take 20 mg by mouth once a week.     . mineral/vitamin supplement (MULTIGEN) 70 MG TABS tablet TAKE ONE TABLET BY MOUTH ONCE DAILY 30 tablet 5  . NASCOBAL 500 MCG/0.1ML SOLN USE 50 MCG AS DIRECTED INTO THE NOSE 8 Bottle 5  . predniSONE (DELTASONE) 1 MG tablet daily. 1.5 mg daily    . SUMAtriptan (IMITREX) 50 MG tablet TAKE ONE TABLET BY MOUTH AS NEEDED. MAY REPEAT  IN 2 TO 4 HOURS IF NEEDED. 9 tablet 5  . urea (CARMOL) 40 % CREA APPLY  CREAM TOPICALLY EVERY DAY 29 each 1  . warfarin (COUMADIN) 2.5 MG tablet Take as directed by coumadin clinic 150 tablet 3  . XANAX 0.25 MG tablet TAKE ONE TABLET BY MOUTH AT BEDTIME AS NEEDED FOR ANXIETY FOR SLEEP. 60 tablet 0   No facility-administered medications prior to visit.     EXAM:  BP 108/70 mmHg  Temp(Src) 97.6 F (36.4 C) (Oral)  Wt 160 lb 8 oz (72.802 kg)  Body mass index is 28.87 kg/(m^2).  GENERAL: vitals reviewed and listed above, alert, oriented, appears well hydrated and in no acute distress  CV: HRRR, no clubbing cyanosis or  peripheral edema nl cap refill  MS: moves all extremities without noticeable focal  abnormality PSYCH: pleasant and cooperative, no obvious depression mildly anxious  UA 3+ blood and Leul ASSESSMENT AND PLAN:  Discussed the following assessment and plan:  Acute cystitis without hematuria - cx today begin the cipro another rx givendis rx [place] 3-5 days but she says 7 days works better    Dysuria - Plan: POCT Urinalysis Dipstick (Automated), Culture, Urine  Leukocytes in urine - Plan: Culture, Urine  History of recurrent UTI (urinary tract infection) Begin antibiotic pending culture will contact her with results if not improving before the end of the week contact us. She leaves to go to [place] 8 days from now. -Patient advised to return or notify health care team  if symptoms worsen ,persist or new concerns arise.  Patient Instructions  You have a uti  And begin the cipro usually 5 days if enough but can take 7 days if needed.   Culture results should be back this week .  Contact us if not getting better   \ok to use otc azo for short term pain.      Standley Brooking. Tremain Rucinski M.D.

## 2015-11-07 ENCOUNTER — Telehealth: Payer: Self-pay | Admitting: Internal Medicine

## 2015-11-07 ENCOUNTER — Ambulatory Visit (INDEPENDENT_AMBULATORY_CARE_PROVIDER_SITE_OTHER): Payer: BC Managed Care – PPO | Admitting: General Practice

## 2015-11-07 DIAGNOSIS — Z5181 Encounter for therapeutic drug level monitoring: Secondary | ICD-10-CM | POA: Diagnosis not present

## 2015-11-07 DIAGNOSIS — R760 Raised antibody titer: Secondary | ICD-10-CM

## 2015-11-07 DIAGNOSIS — R894 Abnormal immunological findings in specimens from other organs, systems and tissues: Secondary | ICD-10-CM

## 2015-11-07 LAB — POCT INR: INR: 2.1

## 2015-11-07 NOTE — Telephone Encounter (Signed)
Noted  

## 2015-11-07 NOTE — Telephone Encounter (Signed)
Patient wants a call back with her test results from Monday when they come back.  I suggested MyChart because hers is active but she wants a call back.

## 2015-11-07 NOTE — Progress Notes (Signed)
Pre visit review using our clinic review tool, if applicable. No additional management support is needed unless otherwise documented below in the visit note. 

## 2015-11-08 ENCOUNTER — Encounter: Payer: Self-pay | Admitting: Internal Medicine

## 2015-11-08 LAB — URINE CULTURE: Colony Count: >=100000

## 2015-11-09 MED ORDER — CEPHALEXIN 500 MG PO CAPS: 500.0000 mg | ORAL_CAPSULE | Freq: Three times a day (TID) | ORAL | Status: DC

## 2015-11-09 NOTE — Telephone Encounter (Signed)
Sent her a message  Please send in 6 more pills of cipro  Take 1 po bid

## 2015-11-09 NOTE — Telephone Encounter (Signed)
Pt reviewed urine culture results on MyChart.  See MyChart message.

## 2015-11-09 NOTE — Telephone Encounter (Signed)
Per Dr Regis Bill, cancel extra cipro and rx Keflex 500 mg tid #15.  Sent to the pharmacy.

## 2015-11-11 ENCOUNTER — Encounter: Payer: Self-pay | Admitting: Internal Medicine

## 2015-11-12 ENCOUNTER — Ambulatory Visit (INDEPENDENT_AMBULATORY_CARE_PROVIDER_SITE_OTHER): Payer: BC Managed Care – PPO | Admitting: General Practice

## 2015-11-12 DIAGNOSIS — R894 Abnormal immunological findings in specimens from other organs, systems and tissues: Secondary | ICD-10-CM

## 2015-11-12 DIAGNOSIS — R760 Raised antibody titer: Secondary | ICD-10-CM

## 2015-11-12 DIAGNOSIS — Z5181 Encounter for therapeutic drug level monitoring: Secondary | ICD-10-CM | POA: Diagnosis not present

## 2015-11-12 LAB — POCT INR: INR: 2.4

## 2015-11-12 NOTE — Telephone Encounter (Signed)
Suggest  do the keflex  Next time if needed      That way we are rotating antibiotics

## 2015-11-12 NOTE — Progress Notes (Signed)
Pre visit review using our clinic review tool, if applicable. No additional management support is needed unless otherwise documented below in the visit note. 

## 2015-11-29 ENCOUNTER — Ambulatory Visit (INDEPENDENT_AMBULATORY_CARE_PROVIDER_SITE_OTHER): Payer: BC Managed Care – PPO | Admitting: Psychology

## 2015-11-29 DIAGNOSIS — F411 Generalized anxiety disorder: Secondary | ICD-10-CM

## 2015-12-05 ENCOUNTER — Ambulatory Visit: Payer: BC Managed Care – PPO

## 2015-12-10 ENCOUNTER — Ambulatory Visit (INDEPENDENT_AMBULATORY_CARE_PROVIDER_SITE_OTHER): Payer: BC Managed Care – PPO | Admitting: General Practice

## 2015-12-10 ENCOUNTER — Ambulatory Visit: Payer: Self-pay

## 2015-12-10 DIAGNOSIS — Z5181 Encounter for therapeutic drug level monitoring: Secondary | ICD-10-CM

## 2015-12-10 DIAGNOSIS — R760 Raised antibody titer: Secondary | ICD-10-CM

## 2015-12-10 DIAGNOSIS — R894 Abnormal immunological findings in specimens from other organs, systems and tissues: Secondary | ICD-10-CM

## 2015-12-10 LAB — POCT INR: INR: 1.6

## 2015-12-18 ENCOUNTER — Other Ambulatory Visit: Payer: Self-pay | Admitting: Family Medicine

## 2015-12-18 ENCOUNTER — Other Ambulatory Visit: Payer: Self-pay

## 2015-12-18 DIAGNOSIS — E559 Vitamin D deficiency, unspecified: Secondary | ICD-10-CM

## 2015-12-18 DIAGNOSIS — E611 Iron deficiency: Secondary | ICD-10-CM

## 2015-12-18 DIAGNOSIS — Z0184 Encounter for antibody response examination: Secondary | ICD-10-CM

## 2015-12-18 DIAGNOSIS — E538 Deficiency of other specified B group vitamins: Secondary | ICD-10-CM

## 2015-12-19 ENCOUNTER — Other Ambulatory Visit (INDEPENDENT_AMBULATORY_CARE_PROVIDER_SITE_OTHER): Payer: BC Managed Care – PPO

## 2015-12-19 DIAGNOSIS — E538 Deficiency of other specified B group vitamins: Secondary | ICD-10-CM | POA: Diagnosis not present

## 2015-12-19 DIAGNOSIS — E611 Iron deficiency: Secondary | ICD-10-CM | POA: Diagnosis not present

## 2015-12-19 DIAGNOSIS — Z0184 Encounter for antibody response examination: Secondary | ICD-10-CM

## 2015-12-19 DIAGNOSIS — E559 Vitamin D deficiency, unspecified: Secondary | ICD-10-CM | POA: Diagnosis not present

## 2015-12-19 LAB — IBC PANEL
Iron: 95 ug/dL (ref 42–145)
SATURATION RATIOS: 27.9 % (ref 20.0–50.0)
TRANSFERRIN: 243 mg/dL (ref 212.0–360.0)

## 2015-12-19 LAB — VITAMIN B12: VITAMIN B 12: 435 pg/mL (ref 211–911)

## 2015-12-20 ENCOUNTER — Ambulatory Visit (INDEPENDENT_AMBULATORY_CARE_PROVIDER_SITE_OTHER): Payer: BC Managed Care – PPO | Admitting: Psychology

## 2015-12-20 DIAGNOSIS — F411 Generalized anxiety disorder: Secondary | ICD-10-CM | POA: Diagnosis not present

## 2015-12-20 LAB — HIV ANTIBODY (ROUTINE TESTING W REFLEX): HIV: NONREACTIVE

## 2015-12-20 LAB — HEPATITIS C ANTIBODY: HCV AB: NEGATIVE

## 2015-12-25 ENCOUNTER — Ambulatory Visit: Payer: Self-pay | Admitting: Internal Medicine

## 2015-12-27 ENCOUNTER — Ambulatory Visit: Payer: BC Managed Care – PPO | Admitting: Psychology

## 2015-12-31 ENCOUNTER — Ambulatory Visit: Payer: Self-pay | Admitting: Internal Medicine

## 2016-01-01 ENCOUNTER — Ambulatory Visit (INDEPENDENT_AMBULATORY_CARE_PROVIDER_SITE_OTHER): Payer: BC Managed Care – PPO | Admitting: Internal Medicine

## 2016-01-01 ENCOUNTER — Encounter: Payer: Self-pay | Admitting: Internal Medicine

## 2016-01-01 VITALS — BP 108/66 | Temp 97.5°F | Wt 157.0 lb

## 2016-01-01 DIAGNOSIS — Z87898 Personal history of other specified conditions: Secondary | ICD-10-CM

## 2016-01-01 DIAGNOSIS — E538 Deficiency of other specified B group vitamins: Secondary | ICD-10-CM | POA: Diagnosis not present

## 2016-01-01 DIAGNOSIS — Z79899 Other long term (current) drug therapy: Secondary | ICD-10-CM

## 2016-01-01 DIAGNOSIS — E611 Iron deficiency: Secondary | ICD-10-CM

## 2016-01-01 DIAGNOSIS — Z87440 Personal history of urinary (tract) infections: Secondary | ICD-10-CM

## 2016-01-01 DIAGNOSIS — Z789 Other specified health status: Secondary | ICD-10-CM

## 2016-01-01 DIAGNOSIS — F419 Anxiety disorder, unspecified: Secondary | ICD-10-CM | POA: Diagnosis not present

## 2016-01-01 DIAGNOSIS — Z7901 Long term (current) use of anticoagulants: Secondary | ICD-10-CM | POA: Diagnosis not present

## 2016-01-01 MED ORDER — XANAX 0.25 MG PO TABS: ORAL_TABLET | ORAL | 2 refills | Status: DC

## 2016-01-01 NOTE — Progress Notes (Signed)
Pre visit review using our clinic review tool, if applicable. No additional management support is needed unless otherwise documented below in the visit note.  Chief Complaint  Patient presents with  . Follow-up    HPI: [name] 55 y.o. comes in   for fu multiple  Problems and med management     Alprazolam taking   Hs for anxiety ... Wants refill brand only for now   elquis still on coumadin still some reservation about lack for reversal no bleeding  b12 ocass misses  Iron   Recurrent UTI : traveled to [place]  And had uti with hematuria at end of trip tool left over cipro  appt with  uro tannenbaum in a few months  Any ideas  Tender to have when travels and  Dec access to bathrooms  Takes abreat for mtx at some t imes  Feels immunisystem better   Sometimes gets fast hr in am no cp sob  Poss anxiety   ROS: See pertinent positives and negatives per HPI.  Past Medical History:  Diagnosis Date  . Adjustment disorder with anxiety   . B12 deficiency due to diet    is a vegetarian  . Chest pain, unspecified   . Chronic anticoagulation   . Headache(784.0)    migraines and head pain  . Hx of varicella    As Child  . Iron deficiency anemia, unspecified   . Otalgia, unspecified   . Other and unspecified hyperlipidemia   . Palpitations   . Personal history of venous thrombosis and embolism   . Rheumatoid arthritis(714.0)    deveshewar  . Unspecified vitamin D deficiency   . UTI (lower urinary tract infection) 11/28/2011   citrobacter       Family History  Problem Relation Age of Onset  . Dementia Father   . Stroke Father   . Hyperlipidemia Father   . Diabetes Father   . Parkinsonism Mother   . Lymphoma      Social History   Social History  . Marital status: Married    Spouse name: N/A  . Number of children: N/A  . Years of education: N/A   Social History Main Topics  . Smoking status: Never Smoker  . Smokeless tobacco: Never Used  . Alcohol use No    . Drug use: No  . Sexual activity: Not Asked   Other Topics Concern  . None   Social History Narrative   Regular Exercise-no   20 yrs in [place]   Son at [facility]   From [place].   Married HH  of 3    G1P1             Outpatient Medications Prior to Visit  Medication Sig Dispense Refill  . alendronate (FOSAMAX) 70 MG tablet once a week.     . Calcium Carb-Cholecalciferol (CALTRATE 600+D) 600-800 MG-UNIT TABS Take by mouth.    . Cholecalciferol (VITAMIN D3) 2000 UNITS TABS Take 1 tablet by mouth daily.    . cycloSPORINE (RESTASIS) 0.05 % ophthalmic emulsion Place 1 drop into both eyes daily.      . folic acid (FOLVITE) 1 MG tablet Take 1 mg by mouth 2 (two) times daily.      . hydroxychloroquine (PLAQUENIL) 200 MG tablet Take 200 mg by mouth 2 (two) times daily.      . methotrexate (RHEUMATREX) 2.5 MG tablet Take 20 mg by mouth once a week.     . mineral/vitamin supplement (MULTIGEN) 70 MG TABS  tablet TAKE ONE TABLET BY MOUTH ONCE DAILY 30 tablet 5  . NASCOBAL 500 MCG/0.1ML SOLN USE 50 MCG AS DIRECTED INTO THE NOSE 8 Bottle 5  . predniSONE (DELTASONE) 1 MG tablet daily. 1.5 mg daily    . SUMAtriptan (IMITREX) 50 MG tablet TAKE ONE TABLET BY MOUTH AS NEEDED. MAY REPEAT IN 2 TO 4 HOURS IF NEEDED. 9 tablet 5  . urea (CARMOL) 40 % CREA APPLY  CREAM TOPICALLY EVERY DAY 29 each 1  . warfarin (COUMADIN) 2.5 MG tablet Take as directed by coumadin clinic 150 tablet 3  . XANAX 0.25 MG tablet TAKE ONE TABLET BY MOUTH AT BEDTIME AS NEEDED FOR ANXIETY FOR SLEEP. 60 tablet 0  . cephALEXin (KEFLEX) 500 MG capsule Take 1 capsule (500 mg total) by mouth 3 (three) times daily. 15 capsule 0  . ciprofloxacin (CIPRO) 500 MG tablet Take 1 tablet (500 mg total) by mouth 2 (two) times daily. 10 tablet 0  . ciprofloxacin (CIPRO) 500 MG tablet Take 1 tablet (500 mg total) by mouth 2 (two) times daily. 14 tablet 0   No facility-administered medications prior to visit.      EXAM:  BP 108/66 (BP Location:  Right Arm, Patient Position: Sitting, Cuff Size: Normal)   Temp 97.5 F (36.4 C) (Oral)   Wt 157 lb (71.2 kg)   BMI 28.26 kg/m   Body mass index is 28.26 kg/m.  GENERAL: vitals reviewed and listed above, alert, oriented, appears well hydrated and in no acute distress HEENT: atraumatic, conjunctiva  clear, no obvious abnormalities on inspection of external nose and ears  NECK: no obvious masses on inspection palpation  LUNGS: clear to auscultation bilaterally, no wheezes, rales or rhonchi, good air movement CV: HRRR, no clubbing cyanosis or  peripheral edema nl cap refill  MS: moves all extremities without noticeable focal  abnormality PSYCH: pleasant and cooperative, no obvious depression stable  anxiety Lab Results  Component Value Date   WBC 9.1 06/20/2015   HGB 14.1 06/20/2015   HCT 43.6 06/20/2015   PLT 227.0 06/20/2015   GLUCOSE 93 06/20/2015   CHOL 179 06/20/2015   TRIG 115.0 06/20/2015   HDL 70.60 06/20/2015   LDLDIRECT 97.3 05/25/2013   LDLCALC 86 06/20/2015   ALT 14 06/20/2015   AST 17 06/20/2015   NA 142 06/20/2015   K 4.3 06/20/2015   CL 106 06/20/2015   CREATININE 0.71 06/20/2015   BUN 10 06/20/2015   CO2 26 06/20/2015   TSH 3.37 06/20/2015   INR 1.6 12/10/2015   b12 435 ibc 27 hep c screen neg ASSESSMENT AND PLAN:  Discussed the following assessment and plan:  Anxiety - disc bnenfoit risk continue  once a dy   wants brand only  Medication management  B12 deficiency - adequate.   Chronic anticoagulation - low dose concernabout no reversal for newer meds but disc newer agents and she is on low dose can disc with hem at [facility] if wishes doeing well on lo dose coumadi  Iron deficiency  Medically complex patient  History of recurrent UTI (urinary tract infection) - seem to flare with travel  Total visit 78mins > 50% spent counseling and coordinating care as indicated in above note and in instructions to patient .    actually doing well at this time  but recurrent utis ar problematic disc options  And fu with  uro   -Patient advised to return or notify health care team  if symptoms worsen ,persist or new  concerns arise.  Patient Instructions  Ask  Dr Gaynelle Arabian about  Advice about the  Travel and utis   Ways to prevent infection when travel. Etc .Your labs are good today .    Caution with alprazolam   But ok to refill  Today     6 months wellness   .     Standley Brooking. Panosh M.D.

## 2016-01-01 NOTE — Patient Instructions (Addendum)
Ask  Dr Gaynelle Arabian about  Advice about the  Travel and utis   Ways to prevent infection when travel. Etc .Your labs are good today .    Caution with alprazolam   But ok to refill  Today     6 months wellness   .

## 2016-01-07 ENCOUNTER — Ambulatory Visit: Payer: Self-pay

## 2016-01-09 ENCOUNTER — Ambulatory Visit (INDEPENDENT_AMBULATORY_CARE_PROVIDER_SITE_OTHER): Payer: BC Managed Care – PPO | Admitting: General Practice

## 2016-01-09 DIAGNOSIS — R894 Abnormal immunological findings in specimens from other organs, systems and tissues: Secondary | ICD-10-CM

## 2016-01-09 DIAGNOSIS — R760 Raised antibody titer: Secondary | ICD-10-CM

## 2016-01-09 DIAGNOSIS — Z5181 Encounter for therapeutic drug level monitoring: Secondary | ICD-10-CM | POA: Diagnosis not present

## 2016-01-09 LAB — POCT INR: INR: 2

## 2016-01-10 ENCOUNTER — Ambulatory Visit (INDEPENDENT_AMBULATORY_CARE_PROVIDER_SITE_OTHER): Payer: BC Managed Care – PPO | Admitting: Psychology

## 2016-01-10 DIAGNOSIS — F411 Generalized anxiety disorder: Secondary | ICD-10-CM | POA: Diagnosis not present

## 2016-01-24 ENCOUNTER — Ambulatory Visit (INDEPENDENT_AMBULATORY_CARE_PROVIDER_SITE_OTHER): Payer: BC Managed Care – PPO | Admitting: Psychology

## 2016-01-24 DIAGNOSIS — F411 Generalized anxiety disorder: Secondary | ICD-10-CM | POA: Diagnosis not present

## 2016-02-06 ENCOUNTER — Ambulatory Visit (INDEPENDENT_AMBULATORY_CARE_PROVIDER_SITE_OTHER): Payer: BC Managed Care – PPO | Admitting: General Practice

## 2016-02-06 DIAGNOSIS — Z5181 Encounter for therapeutic drug level monitoring: Secondary | ICD-10-CM | POA: Diagnosis not present

## 2016-02-06 DIAGNOSIS — R894 Abnormal immunological findings in specimens from other organs, systems and tissues: Secondary | ICD-10-CM | POA: Diagnosis not present

## 2016-02-06 DIAGNOSIS — R760 Raised antibody titer: Secondary | ICD-10-CM

## 2016-02-06 LAB — POCT INR: INR: 2

## 2016-02-15 ENCOUNTER — Other Ambulatory Visit: Payer: Self-pay | Admitting: Internal Medicine

## 2016-02-15 NOTE — Telephone Encounter (Signed)
Ok will send in

## 2016-02-21 ENCOUNTER — Ambulatory Visit (INDEPENDENT_AMBULATORY_CARE_PROVIDER_SITE_OTHER): Payer: BC Managed Care – PPO | Admitting: Psychology

## 2016-02-21 DIAGNOSIS — F411 Generalized anxiety disorder: Secondary | ICD-10-CM

## 2016-03-05 ENCOUNTER — Ambulatory Visit (INDEPENDENT_AMBULATORY_CARE_PROVIDER_SITE_OTHER): Payer: BC Managed Care – PPO | Admitting: General Practice

## 2016-03-05 ENCOUNTER — Ambulatory Visit (INDEPENDENT_AMBULATORY_CARE_PROVIDER_SITE_OTHER): Payer: BC Managed Care – PPO | Admitting: Family Medicine

## 2016-03-05 ENCOUNTER — Other Ambulatory Visit: Payer: Self-pay | Admitting: General Practice

## 2016-03-05 DIAGNOSIS — R768 Other specified abnormal immunological findings in serum: Secondary | ICD-10-CM

## 2016-03-05 DIAGNOSIS — R760 Raised antibody titer: Secondary | ICD-10-CM

## 2016-03-05 DIAGNOSIS — Z23 Encounter for immunization: Secondary | ICD-10-CM | POA: Diagnosis not present

## 2016-03-05 DIAGNOSIS — Z5181 Encounter for therapeutic drug level monitoring: Secondary | ICD-10-CM

## 2016-03-05 LAB — POCT INR: INR: 1.6

## 2016-03-05 MED ORDER — WARFARIN SODIUM 2.5 MG PO TABS: ORAL_TABLET | ORAL | 1 refills | Status: DC

## 2016-03-06 ENCOUNTER — Ambulatory Visit: Payer: BC Managed Care – PPO | Admitting: Psychology

## 2016-03-17 ENCOUNTER — Other Ambulatory Visit: Payer: Self-pay | Admitting: Internal Medicine

## 2016-03-17 DIAGNOSIS — Z1231 Encounter for screening mammogram for malignant neoplasm of breast: Secondary | ICD-10-CM

## 2016-03-20 ENCOUNTER — Ambulatory Visit (INDEPENDENT_AMBULATORY_CARE_PROVIDER_SITE_OTHER): Payer: BC Managed Care – PPO | Admitting: Psychology

## 2016-03-20 DIAGNOSIS — F411 Generalized anxiety disorder: Secondary | ICD-10-CM

## 2016-03-24 ENCOUNTER — Other Ambulatory Visit: Payer: Self-pay | Admitting: Rheumatology

## 2016-03-25 NOTE — Telephone Encounter (Signed)
Last visit 11/01/15 Next visit 04/02/16 Labs 12/25/15 WNL Ok to refill per Dr Estanislado Pandy

## 2016-03-26 ENCOUNTER — Other Ambulatory Visit: Payer: Self-pay | Admitting: Rheumatology

## 2016-03-26 LAB — COMPLETE METABOLIC PANEL WITH GFR
ALT: 15 U/L (ref 6–29)
AST: 17 U/L (ref 10–35)
Albumin: 3.8 g/dL (ref 3.6–5.1)
Alkaline Phosphatase: 70 U/L (ref 33–130)
BUN: 8 mg/dL (ref 7–25)
CHLORIDE: 106 mmol/L (ref 98–110)
CO2: 28 mmol/L (ref 20–31)
CREATININE: 0.64 mg/dL (ref 0.50–1.05)
Calcium: 8.7 mg/dL (ref 8.6–10.4)
GFR, Est Non African American: >89 mL/min (ref >=60)
Glucose, Bld: 99 mg/dL (ref 65–99)
POTASSIUM: 4.3 mmol/L (ref 3.5–5.3)
Sodium: 139 mmol/L (ref 135–146)
Total Bilirubin: 0.6 mg/dL (ref 0.2–1.2)
Total Protein: 6.5 g/dL (ref 6.1–8.1)

## 2016-03-26 LAB — CBC WITH DIFFERENTIAL/PLATELET
BASOS PCT: 0 %
Basophils Absolute: 0 cells/uL (ref 0–200)
EOS ABS: 154 {cells}/uL (ref 15–500)
Eosinophils Relative: 2 %
HEMATOCRIT: 42.6 % (ref 35.0–45.0)
HEMOGLOBIN: 14 g/dL (ref 11.7–15.5)
LYMPHS ABS: 2695 {cells}/uL (ref 850–3900)
LYMPHS PCT: 35 %
MCH: 27.9 pg (ref 27.0–33.0)
MCHC: 32.9 g/dL (ref 32.0–36.0)
MCV: 84.9 fL (ref 80.0–100.0)
MONO ABS: 616 {cells}/uL (ref 200–950)
MPV: 10.9 fL (ref 7.5–12.5)
Monocytes Relative: 8 %
NEUTROS ABS: 4235 {cells}/uL (ref 1500–7800)
Neutrophils Relative %: 55 %
Platelets: 274 10*3/uL (ref 140–400)
RBC: 5.02 MIL/uL (ref 3.80–5.10)
RDW: 14.1 % (ref 11.0–15.0)
WBC: 7.7 10*3/uL (ref 3.8–10.8)

## 2016-03-31 ENCOUNTER — Other Ambulatory Visit: Payer: Self-pay | Admitting: Rheumatology

## 2016-03-31 ENCOUNTER — Ambulatory Visit (INDEPENDENT_AMBULATORY_CARE_PROVIDER_SITE_OTHER): Payer: BC Managed Care – PPO

## 2016-03-31 DIAGNOSIS — G43909 Migraine, unspecified, not intractable, without status migrainosus: Secondary | ICD-10-CM | POA: Insufficient documentation

## 2016-03-31 DIAGNOSIS — Z5181 Encounter for therapeutic drug level monitoring: Secondary | ICD-10-CM

## 2016-03-31 DIAGNOSIS — M19071 Primary osteoarthritis, right ankle and foot: Secondary | ICD-10-CM | POA: Insufficient documentation

## 2016-03-31 DIAGNOSIS — R760 Raised antibody titer: Secondary | ICD-10-CM

## 2016-03-31 DIAGNOSIS — Z79899 Other long term (current) drug therapy: Secondary | ICD-10-CM | POA: Insufficient documentation

## 2016-03-31 DIAGNOSIS — R768 Other specified abnormal immunological findings in serum: Secondary | ICD-10-CM

## 2016-03-31 DIAGNOSIS — M170 Bilateral primary osteoarthritis of knee: Secondary | ICD-10-CM | POA: Insufficient documentation

## 2016-03-31 DIAGNOSIS — F419 Anxiety disorder, unspecified: Secondary | ICD-10-CM | POA: Insufficient documentation

## 2016-03-31 DIAGNOSIS — M19072 Primary osteoarthritis, left ankle and foot: Secondary | ICD-10-CM

## 2016-03-31 LAB — POCT INR: INR: 1.8

## 2016-03-31 NOTE — Progress Notes (Signed)
Office Visit Note  Patient: [name]             Date of Birth: [birth year]           MRN: [id number]             PCP: Lottie Dawson, MD Referring: Burnis Medin, MD Visit Date: 04/02/2016 Occupation: Retired    Subjective:  Right knee pain    History of Present Illness: [name] is a 55 y.o. female with anti- CCP positive rheumatoid arthritis. She is doing fairly well on the combination therapy. She states she has reduced her prednisone 2.5 mg a day now. She doesn't have any joint swelling but experiences some stiffness in her hands and feet. She also has some discomfort in her knees especially when she climbs stairs. She's been tolerating her medications well.  Activities of Daily Living:  Patient reports morning stiffness for 0 minutes.   Patient Denies nocturnal pain.  Difficulty dressing/grooming: Denies Difficulty climbing stairs: Reports Difficulty getting out of chair: Denies Difficulty using hands for taps, buttons, cutlery, and/or writing: Denies   Review of Systems  Constitutional: Positive for fatigue. Negative for night sweats, weight gain, weight loss and weakness.  HENT: Negative for mouth sores, trouble swallowing, trouble swallowing, mouth dryness and nose dryness.   Eyes: Positive for dryness. Negative for pain, redness and visual disturbance.  Respiratory: Negative for cough, shortness of breath and difficulty breathing.   Cardiovascular: Negative for chest pain, palpitations, hypertension, irregular heartbeat and swelling in legs/feet.  Gastrointestinal: Negative for blood in stool, constipation and diarrhea.  Endocrine: Negative for increased urination.  Genitourinary: Negative for vaginal dryness.  Musculoskeletal: Positive for arthralgias, joint pain and morning stiffness. Negative for joint swelling, myalgias, muscle weakness, muscle tenderness and myalgias.  Skin: Negative for color change, rash, hair loss, skin tightness,  ulcers and sensitivity to sunlight.  Allergic/Immunologic: Negative for susceptible to infections.  Neurological: Negative for dizziness, memory loss and night sweats.  Hematological: Negative for swollen glands.  Psychiatric/Behavioral: Negative for depressed mood and sleep disturbance. The patient is nervous/anxious.     PMFS History:  Patient Active Problem List   Diagnosis Date Noted  . Migraine 03/31/2016  . Anxiety 03/31/2016  . High risk medication use 03/31/2016  . Primary osteoarthritis of both feet 03/31/2016  . Primary osteoarthritis of both knees 03/31/2016  . Stiffness of left hand joint 11/27/2014  . Elevated IOP 06/09/2014  . Long term current use of systemic steroids 06/09/2014  . Medically complex patient 06/09/2014  . Sore throat 01/09/2014  . Current use of steroid medication 01/09/2014  . Xerosis of skin 12/07/2013  . Encounter for therapeutic drug monitoring 06/09/2013  . B12 deficiency 05/31/2013  . Iron deficiency 05/31/2013  . Visit for preventive health examination 05/25/2012  . Mammogram abnormal 04/12/2012  . 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 Vaginal atrophy 01/23/2012  . History of recurrent UTI (urinary tract infection) 01/23/2012  . Osteoporosis 04/29/2011  . Nonspecific abnormal results of liver function study 12/08/2010  . Chronic anticoagulation   . Long term (current) use of anticoagulants 07/15/2010  . 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 CHEST PAIN-UNSPECIFIED 08/26/2008  . PALPITATIONS, RECURRENT 07/24/2008  . VITAMIN D DEFICIENCY 05/30/2008  . HYPERLIPIDEMIA 05/30/2008  . IRON DEFICIENCY 05/30/2008  . Adjustment disorder with anxiety 05/30/2008  . Rheumatoid arthritis ([place]) 05/30/2008  . PULMONARY EMBOLISM, HX OF 05/30/2008    Past Medical History:  Diagnosis Date  . Adjustment disorder with anxiety   .  B12 deficiency due to diet    is a vegetarian  . Chest pain, unspecified   . Chronic anticoagulation   .  Headache(784.0)    migraines and head pain  . Hx of varicella    As Child  . Iron deficiency anemia, unspecified   . Otalgia, unspecified   . Other and unspecified hyperlipidemia   . Palpitations   . Personal history of venous thrombosis and embolism   . Recurrent UTI   . Rheumatoid arthritis(714.0)    deveshewar  . Unspecified vitamin D deficiency   . UTI (lower urinary tract infection) 11/28/2011   citrobacter       Family History  Problem Relation Age of Onset  . Dementia Father   . Stroke Father   . Hyperlipidemia Father   . Diabetes Father   . Parkinsonism Mother   . Lymphoma     Past Surgical History:  Procedure Laterality Date  . TYMPANOSTOMY TUBE PLACEMENT  2009   Right   Social History   Social History Narrative   Regular Exercise-no   20 yrs in [place]   Son at [facility] [employer]   From [place].   Married HH  of 3    G1P1              Objective: Vital Signs: BP 107/70 (BP Location: Right Arm, Patient Position: Sitting, Cuff Size: Large)   Pulse 69   Resp 13   Ht 5\' 3"  (1.6 m)   Wt 164 lb (74.4 kg)   BMI 29.05 kg/m    Physical Exam  Constitutional: She is oriented to person, place, and time. She appears well-developed and well-nourished.  HENT:  Head: Normocephalic and atraumatic.  Eyes: Conjunctivae and EOM are normal.  Neck: Normal range of motion.  Cardiovascular: Normal rate, regular rhythm, normal heart sounds and intact distal pulses.   Pulmonary/Chest: Effort normal and breath sounds normal.  Abdominal: Soft. Bowel sounds are normal.  Lymphadenopathy:    She has no cervical adenopathy.  Neurological: She is alert and oriented to person, place, and time.  Skin: Skin is warm and dry. Capillary refill takes less than 2 seconds.  Psychiatric: She has a normal mood and affect. Her behavior is normal.  Nursing note and vitals reviewed.    Musculoskeletal Exam: Cervical, thoracic, lumbar spine good range of motion. Shoulder joints, elbow joints, wrist  joints, MCPs PIPs and DIPs of her hands were good range of motion with no synovitis. Hip joints, knee joints, ankle joints, MTPs PIPs with good range of motion with no synovitis.  CDAI Exam: CDAI Homunculus Exam:   Joint Counts:  CDAI Tender Joint count: 0 CDAI Swollen Joint count: 0  Global Assessments:  Patient Global Assessment: 2 Provider Global Assessment: 2  CDAI Calculated Score: 4    Investigation: Findings:  12/24/2015 CMP normal, CBC normal, CCP > 250, vitamin D 33 03/26/2016 CBC normal, CMP normal    Imaging: No results found.  Speciality Comments: No specialty comments available.    Procedures:  No procedures performed Allergies: Boniva [ibandronate sodium]; Ivp dye [iodinated diagnostic agents]; and Risedronate sodium [risedronate sodium]   Assessment / Plan: Visit Diagnoses: Rheumatoid arthritis of multiple sites with negative rheumatoid factor (HCC) - Rheumatoid factor negative, anti-CCP positive, ANA positive, sicca symptoms, h/oparotitis. She has no synovitis on examination today. She has reduced her prednisone to 0.5 mg a day. We discussed tapering prednisone down to 0.5 mg every other day for couple of months if she does well then  she can stop it. As she is doing well and would like to get her off Plaquenil in future as well.  High risk medication use - Methotrexate 8 tablets per week, Plaquenil 200 mg bid Monday to Friday, prednisone 1mg  , half tablet daily. Her labs are stable. We will check her labs again in 3 months and then every 3 months to monitor for drug toxicity she's been getting her eye exams as well.  Primary osteoarthritis of both feet - Bilateral calcaneal spurs: Some discomfort proper fitting shoes were discussed.  Primary osteoarthritis of both knees - Bilateral mild: Not much discomfort in the knee joints currently.  Osteoporosis - July 2016 T score -4.2 radius, on Fosamax: Her next bone densities due in July 2018. Calcium, vitamin D  and resistive exercises were discussed.  PULMONARY EMBOLISM, Chronic anticoagulation  Raised intraocular pressure of both eyes: Monitored by her ophthalmologist  B12 deficiency: She is on supplements   migraine : Occasional now  Anxiety : Is still an issue   Orders: Orders Placed This Encounter  Procedures  . CBC with Differential/Platelet  . COMPLETE METABOLIC PANEL WITH GFR   Meds ordered this encounter  Medications  . folic acid (FOLVITE) 1 MG tablet    Sig: Take 2 tablets (2 mg total) by mouth daily.    Dispense:  180 tablet    Refill:  3    Face-to-face time spent with patient was 30 minutes. 50% of time was spent in counseling and coordination of care.  Follow-Up Instructions: Return in about 5 months (around 08/31/2016) for Rheumatoid arthritis.   Bo Merino, MD

## 2016-03-31 NOTE — Patient Instructions (Signed)
Pre visit review using our clinic review tool, if applicable. No additional management support is needed unless otherwise documented below in the visit note. 

## 2016-03-31 NOTE — Telephone Encounter (Signed)
Last visit 11/01/15 Next visit 04/02/16 Labs 12/25/15 Normal PLQ eye exam 03/08/15 Ok to refill per Dr Estanislado Pandy

## 2016-04-02 ENCOUNTER — Encounter: Payer: Self-pay | Admitting: Rheumatology

## 2016-04-02 ENCOUNTER — Ambulatory Visit (INDEPENDENT_AMBULATORY_CARE_PROVIDER_SITE_OTHER): Payer: BC Managed Care – PPO | Admitting: Rheumatology

## 2016-04-02 VITALS — BP 107/70 | HR 69 | Resp 13 | Ht 63.0 in | Wt 164.0 lb

## 2016-04-02 DIAGNOSIS — H40053 Ocular hypertension, bilateral: Secondary | ICD-10-CM

## 2016-04-02 DIAGNOSIS — E538 Deficiency of other specified B group vitamins: Secondary | ICD-10-CM | POA: Diagnosis not present

## 2016-04-02 DIAGNOSIS — M810 Age-related osteoporosis without current pathological fracture: Secondary | ICD-10-CM

## 2016-04-02 DIAGNOSIS — Z7901 Long term (current) use of anticoagulants: Secondary | ICD-10-CM | POA: Diagnosis not present

## 2016-04-02 DIAGNOSIS — M19072 Primary osteoarthritis, left ankle and foot: Secondary | ICD-10-CM | POA: Diagnosis not present

## 2016-04-02 DIAGNOSIS — M0609 Rheumatoid arthritis without rheumatoid factor, multiple sites: Secondary | ICD-10-CM

## 2016-04-02 DIAGNOSIS — Z79899 Other long term (current) drug therapy: Secondary | ICD-10-CM | POA: Diagnosis not present

## 2016-04-02 DIAGNOSIS — F419 Anxiety disorder, unspecified: Secondary | ICD-10-CM

## 2016-04-02 DIAGNOSIS — M19071 Primary osteoarthritis, right ankle and foot: Secondary | ICD-10-CM

## 2016-04-02 DIAGNOSIS — M170 Bilateral primary osteoarthritis of knee: Secondary | ICD-10-CM | POA: Diagnosis not present

## 2016-04-02 DIAGNOSIS — G43809 Other migraine, not intractable, without status migrainosus: Secondary | ICD-10-CM | POA: Diagnosis not present

## 2016-04-02 DIAGNOSIS — Z86718 Personal history of other venous thrombosis and embolism: Secondary | ICD-10-CM

## 2016-04-02 MED ORDER — FOLIC ACID 1 MG PO TABS: 2.0000 mg | ORAL_TABLET | Freq: Every day | ORAL | 3 refills | Status: DC

## 2016-04-02 NOTE — Patient Instructions (Signed)
Standing Labs We placed an order today for your standing lab work.    Please come back and get your standing labs in February 2018  We have open lab Monday through Friday from 8:30-11:30 AM and 1-4 PM at the office of Dr. Tresa Moore, PA.   The office is located at [address] [place], [place], [place] [address] No appointment is necessary.   Labs are drawn by [facility].  You may receive a bill from [facility] for your lab work.

## 2016-04-03 ENCOUNTER — Ambulatory Visit: Payer: BC Managed Care – PPO | Admitting: Psychology

## 2016-04-17 ENCOUNTER — Ambulatory Visit (INDEPENDENT_AMBULATORY_CARE_PROVIDER_SITE_OTHER): Payer: BC Managed Care – PPO | Admitting: Psychology

## 2016-04-17 DIAGNOSIS — F411 Generalized anxiety disorder: Secondary | ICD-10-CM | POA: Diagnosis not present

## 2016-04-23 ENCOUNTER — Ambulatory Visit
Admission: RE | Admit: 2016-04-23 | Discharge: 2016-04-23 | Disposition: A | Discharge status: Home / Self Care | Payer: BC Managed Care – PPO | Source: Ambulatory Visit | Attending: Internal Medicine | Admitting: Internal Medicine

## 2016-04-23 DIAGNOSIS — Z1231 Encounter for screening mammogram for malignant neoplasm of breast: Secondary | ICD-10-CM

## 2016-04-28 ENCOUNTER — Ambulatory Visit (INDEPENDENT_AMBULATORY_CARE_PROVIDER_SITE_OTHER): Payer: BC Managed Care – PPO | Admitting: General Practice

## 2016-04-28 DIAGNOSIS — Z5181 Encounter for therapeutic drug level monitoring: Secondary | ICD-10-CM

## 2016-04-28 DIAGNOSIS — R760 Raised antibody titer: Secondary | ICD-10-CM

## 2016-04-28 DIAGNOSIS — R768 Other specified abnormal immunological findings in serum: Secondary | ICD-10-CM

## 2016-04-28 LAB — POCT INR: INR: 1.3

## 2016-04-28 NOTE — Patient Instructions (Signed)
Pre visit review using our clinic review tool, if applicable. No additional management support is needed unless otherwise documented below in the visit note. 

## 2016-05-02 ENCOUNTER — Other Ambulatory Visit: Payer: Self-pay | Admitting: Internal Medicine

## 2016-05-06 NOTE — Telephone Encounter (Signed)
Pt requested a 90 day supply.  Has upcoming appt on 07/09/16 for yearly.

## 2016-05-15 ENCOUNTER — Ambulatory Visit: Payer: BC Managed Care – PPO | Admitting: Psychology

## 2016-05-26 ENCOUNTER — Ambulatory Visit (INDEPENDENT_AMBULATORY_CARE_PROVIDER_SITE_OTHER): Payer: BC Managed Care – PPO | Admitting: General Practice

## 2016-05-26 DIAGNOSIS — Z5181 Encounter for therapeutic drug level monitoring: Secondary | ICD-10-CM | POA: Diagnosis not present

## 2016-05-26 DIAGNOSIS — R760 Raised antibody titer: Secondary | ICD-10-CM

## 2016-05-26 DIAGNOSIS — R768 Other specified abnormal immunological findings in serum: Secondary | ICD-10-CM

## 2016-05-26 LAB — POCT INR: INR: 1.4

## 2016-05-26 NOTE — Patient Instructions (Signed)
Pre visit review using our clinic review tool, if applicable. No additional management support is needed unless otherwise documented below in the visit note. 

## 2016-05-29 ENCOUNTER — Ambulatory Visit (INDEPENDENT_AMBULATORY_CARE_PROVIDER_SITE_OTHER): Payer: BC Managed Care – PPO | Admitting: Psychology

## 2016-05-29 DIAGNOSIS — F411 Generalized anxiety disorder: Secondary | ICD-10-CM

## 2016-06-07 ENCOUNTER — Other Ambulatory Visit: Payer: Self-pay | Admitting: Rheumatology

## 2016-06-09 NOTE — Telephone Encounter (Signed)
03/26/16 labs WNL Appt sch for follow up 09/03/16 Ok to refill per Dr Estanislado Pandy

## 2016-06-12 ENCOUNTER — Telehealth: Payer: Self-pay

## 2016-06-12 ENCOUNTER — Ambulatory Visit (INDEPENDENT_AMBULATORY_CARE_PROVIDER_SITE_OTHER): Payer: BC Managed Care – PPO | Admitting: Psychology

## 2016-06-12 DIAGNOSIS — F411 Generalized anxiety disorder: Secondary | ICD-10-CM | POA: Diagnosis not present

## 2016-06-12 NOTE — Telephone Encounter (Signed)
Received PA request from [facility] for [place] 40% cream. PA submitted & is pending. Key: [id number]

## 2016-06-16 NOTE — Telephone Encounter (Signed)
PA denied. Medication is not on the benefit plan.

## 2016-06-18 NOTE — Telephone Encounter (Signed)
Please inform patient of this decision by her insurance company. Not sure if there are other equivalence that would be helpful for her. She might ask her insurance company on formulary if there is something similar they will cover. Would be happy to prescribe that otherwise she'll have to pay out of pocket.

## 2016-06-19 NOTE — Telephone Encounter (Signed)
Spoke to the pt.  She informed me that she bought the cream with a coupon and it was not very expensive.

## 2016-06-20 ENCOUNTER — Other Ambulatory Visit: Payer: Self-pay | Admitting: Obstetrics and Gynecology

## 2016-06-20 ENCOUNTER — Other Ambulatory Visit (HOSPITAL_COMMUNITY)
Admission: RE | Admit: 2016-06-20 | Discharge: 2016-06-20 | Disposition: A | Discharge status: Home / Self Care | Payer: BC Managed Care – PPO | Source: Ambulatory Visit | Attending: Obstetrics and Gynecology | Admitting: Obstetrics and Gynecology

## 2016-06-20 DIAGNOSIS — Z1151 Encounter for screening for human papillomavirus (HPV): Secondary | ICD-10-CM | POA: Insufficient documentation

## 2016-06-20 DIAGNOSIS — Z01419 Encounter for gynecological examination (general) (routine) without abnormal findings: Secondary | ICD-10-CM | POA: Diagnosis present

## 2016-06-23 ENCOUNTER — Ambulatory Visit (INDEPENDENT_AMBULATORY_CARE_PROVIDER_SITE_OTHER): Payer: BC Managed Care – PPO | Admitting: General Practice

## 2016-06-23 DIAGNOSIS — Z5181 Encounter for therapeutic drug level monitoring: Secondary | ICD-10-CM

## 2016-06-23 DIAGNOSIS — R760 Raised antibody titer: Secondary | ICD-10-CM

## 2016-06-23 DIAGNOSIS — R768 Other specified abnormal immunological findings in serum: Secondary | ICD-10-CM

## 2016-06-23 LAB — POCT INR: INR: 1.9

## 2016-06-23 NOTE — Patient Instructions (Signed)
Pre visit review using our clinic review tool, if applicable. No additional management support is needed unless otherwise documented below in the visit note. 

## 2016-06-25 LAB — CYTOLOGY - PAP
DIAGNOSIS: NEGATIVE
HPV: NOT DETECTED

## 2016-06-26 ENCOUNTER — Ambulatory Visit: Payer: BC Managed Care – PPO | Admitting: Psychology

## 2016-06-30 ENCOUNTER — Other Ambulatory Visit: Payer: Self-pay | Admitting: Rheumatology

## 2016-06-30 NOTE — Telephone Encounter (Signed)
Last Visit: 04/02/16 Next Visit: 09/03/16 Labs: 03/26/16 WNL PLQ Eye Exam: 06/12/16 WNL  Okay to refill PLQ?

## 2016-07-03 ENCOUNTER — Other Ambulatory Visit (INDEPENDENT_AMBULATORY_CARE_PROVIDER_SITE_OTHER): Payer: BC Managed Care – PPO

## 2016-07-03 DIAGNOSIS — E611 Iron deficiency: Secondary | ICD-10-CM

## 2016-07-03 DIAGNOSIS — Z0000 Encounter for general adult medical examination without abnormal findings: Secondary | ICD-10-CM

## 2016-07-03 DIAGNOSIS — E538 Deficiency of other specified B group vitamins: Secondary | ICD-10-CM

## 2016-07-03 LAB — LIPID PANEL
Cholesterol: 187 mg/dL (ref 0–200)
HDL: 72 mg/dL (ref >39.00)
LDL CALC: 86 mg/dL (ref 0–99)
NONHDL: 114.84
Total CHOL/HDL Ratio: 3
Triglycerides: 145 mg/dL (ref 0.0–149.0)
VLDL: 29 mg/dL (ref 0.0–40.0)

## 2016-07-03 LAB — VITAMIN B12: VITAMIN B 12: 611 pg/mL (ref 211–911)

## 2016-07-03 LAB — CBC WITH DIFFERENTIAL/PLATELET
BASOS PCT: 0.6 % (ref 0.0–3.0)
Basophils Absolute: 0 10*3/uL (ref 0.0–0.1)
EOS PCT: 1.4 % (ref 0.0–5.0)
Eosinophils Absolute: 0.1 10*3/uL (ref 0.0–0.7)
HCT: 42 % (ref 36.0–46.0)
Hemoglobin: 13.7 g/dL (ref 12.0–15.0)
Lymphocytes Relative: 34.3 % (ref 12.0–46.0)
Lymphs Abs: 2.6 10*3/uL (ref 0.7–4.0)
MCHC: 32.7 g/dL (ref 30.0–36.0)
MCV: 86.5 fl (ref 78.0–100.0)
MONO ABS: 0.6 10*3/uL (ref 0.1–1.0)
Monocytes Relative: 7.7 % (ref 3.0–12.0)
Neutro Abs: 4.2 10*3/uL (ref 1.4–7.7)
Neutrophils Relative %: 56 % (ref 43.0–77.0)
Platelets: 263 10*3/uL (ref 150.0–400.0)
RBC: 4.86 Mil/uL (ref 3.87–5.11)
RDW: 13.6 % (ref 11.5–15.5)
WBC: 7.6 10*3/uL (ref 4.0–10.5)

## 2016-07-03 LAB — BASIC METABOLIC PANEL
BUN: 9 mg/dL (ref 6–23)
CHLORIDE: 107 meq/L (ref 96–112)
CO2: 29 mEq/L (ref 19–32)
Calcium: 9 mg/dL (ref 8.4–10.5)
Creatinine, Ser: 0.73 mg/dL (ref 0.40–1.20)
GFR: 87.03 mL/min (ref >60.00)
Glucose, Bld: 101 mg/dL — ABNORMAL HIGH (ref 70–99)
POTASSIUM: 4.3 meq/L (ref 3.5–5.1)
SODIUM: 140 meq/L (ref 135–145)

## 2016-07-03 LAB — HEPATIC FUNCTION PANEL
ALT: 14 U/L (ref 0–35)
AST: 16 U/L (ref 0–37)
Albumin: 3.9 g/dL (ref 3.5–5.2)
Alkaline Phosphatase: 68 U/L (ref 39–117)
BILIRUBIN TOTAL: 0.7 mg/dL (ref 0.2–1.2)
Bilirubin, Direct: 0.1 mg/dL (ref 0.0–0.3)
Total Protein: 6.8 g/dL (ref 6.0–8.3)

## 2016-07-03 LAB — IBC PANEL
Iron: 59 ug/dL (ref 42–145)
Saturation Ratios: 17.4 % — ABNORMAL LOW (ref 20.0–50.0)
Transferrin: 242 mg/dL (ref 212.0–360.0)

## 2016-07-03 LAB — TSH: TSH: 3.36 u[IU]/mL (ref 0.35–4.50)

## 2016-07-09 ENCOUNTER — Encounter: Payer: Self-pay | Admitting: Internal Medicine

## 2016-07-09 ENCOUNTER — Ambulatory Visit (INDEPENDENT_AMBULATORY_CARE_PROVIDER_SITE_OTHER): Payer: BC Managed Care – PPO | Admitting: Internal Medicine

## 2016-07-09 VITALS — BP 106/70 | Temp 98.0°F | Ht 62.5 in | Wt 160.0 lb

## 2016-07-09 DIAGNOSIS — E538 Deficiency of other specified B group vitamins: Secondary | ICD-10-CM | POA: Diagnosis not present

## 2016-07-09 DIAGNOSIS — L680 Hirsutism: Secondary | ICD-10-CM

## 2016-07-09 DIAGNOSIS — Z0000 Encounter for general adult medical examination without abnormal findings: Secondary | ICD-10-CM

## 2016-07-09 DIAGNOSIS — Z79899 Other long term (current) drug therapy: Secondary | ICD-10-CM | POA: Diagnosis not present

## 2016-07-09 DIAGNOSIS — E611 Iron deficiency: Secondary | ICD-10-CM

## 2016-07-09 DIAGNOSIS — R058 Other specified cough: Secondary | ICD-10-CM

## 2016-07-09 DIAGNOSIS — R05 Cough: Secondary | ICD-10-CM | POA: Diagnosis not present

## 2016-07-09 DIAGNOSIS — Z7901 Long term (current) use of anticoagulants: Secondary | ICD-10-CM | POA: Diagnosis not present

## 2016-07-09 DIAGNOSIS — Z789 Other specified health status: Secondary | ICD-10-CM | POA: Diagnosis not present

## 2016-07-09 DIAGNOSIS — M0609 Rheumatoid arthritis without rheumatoid factor, multiple sites: Secondary | ICD-10-CM

## 2016-07-09 DIAGNOSIS — F419 Anxiety disorder, unspecified: Secondary | ICD-10-CM | POA: Diagnosis not present

## 2016-07-09 NOTE — Patient Instructions (Addendum)
I think trying this parental lack time is a good idea but may take months to see a good effect. Just make sure your potassium is being monitored. Avoid simple sugars and sweets to make sure your blood sugar doesn't rise.   Continue lifestyle intervention healthy eating and exercise .  Blood test look good today   Heel pain is probably not anything related to your other medical conditions. The most common cause is plantar fasciitis. You can use cool at night padding in your heels in your shoes.   Cough can be from reflux postnasal drainage other irritations. Avoid eating before he goes to bed at night avoid foods that aggravate reflux. If continuing follow-up with me in another 3 months.  Can try melatonin 2-3 hours before sleep. It is not a sleeping pill but can put her brain in the right times. Attend to your sleep hygiene.   Follow-up in 4-6 months depending on how you were doing. If your cough is better make it 6 months otherwise 4 months Get your records regarding your colonoscopy and then we can do a referral consult with Dr. Henrene Pastor. You might be a candidate for cologuard.   Food Choices for Gastroesophageal Reflux Disease, Adult When you have gastroesophageal reflux disease (GERD), the foods you eat and your eating habits are very important. Choosing the right foods can help ease the discomfort of GERD. What general guidelines do I need to follow?  Choose fruits, vegetables, whole grains, low-fat dairy products, and low-fat meat, fish, and poultry.  Limit fats such as oils, salad dressings, butter, nuts, and avocado.  Keep a food diary to identify foods that cause symptoms.  Avoid foods that cause reflux. These may be different for different people.  Eat frequent small meals instead of three large meals each day.  Eat your meals slowly, in a relaxed setting.  Limit fried foods.  Cook foods using methods other than frying.  Avoid drinking alcohol.  Avoid drinking large  amounts of liquids with your meals.  Avoid bending over or lying down until 2-3 hours after eating. What foods are not recommended? The following are some foods and drinks that may worsen your symptoms: Vegetables  Tomatoes. Tomato juice. Tomato and spaghetti sauce. Chili peppers. Onion and garlic. Horseradish. Fruits  Oranges, grapefruit, and lemon (fruit and juice). Meats  High-fat meats, fish, and poultry. This includes hot dogs, ribs, ham, sausage, salami, and bacon. Dairy  Whole milk and chocolate milk. Sour cream. Cream. Butter. Ice cream. Cream cheese. Beverages  Coffee and tea, with or without caffeine. Carbonated beverages or energy drinks. Condiments  Hot sauce. Barbecue sauce. Sweets/Desserts  Chocolate and cocoa. Donuts. Peppermint and spearmint. Fats and Oils  High-fat foods, including [place] fries and potato chips. Other  Vinegar. Strong spices, such as black pepper, white pepper, red pepper, cayenne, curry powder, cloves, ginger, and chili powder. The items listed above may not be a complete list of foods and beverages to avoid. Contact your dietitian for more information.  This information is not intended to replace advice given to you by your health care provider. Make sure you discuss any questions you have with your health care provider. Document Released: 05/05/2005 Document Revised: 10/11/2015 Document Reviewed: 03/09/2013 Elsevier Interactive Patient Education  2017 Elsevier Inc.    Plantar Fasciitis Plantar fasciitis is a painful foot condition that affects the heel. It occurs when the band of tissue that connects the toes to the heel bone (plantar fascia) becomes irritated. This can happen  after exercising too much or doing other repetitive activities (overuse injury). The pain from plantar fasciitis can range from mild irritation to severe pain that makes it difficult for you to walk or move. The pain is usually worse in the morning or after you have been  sitting or lying down for a while. CAUSES This condition may be caused by:  Standing for long periods of time.  Wearing shoes that do not fit.  Doing high-impact activities, including running, aerobics, and ballet.  Being overweight.  Having an abnormal way of walking (gait).  Having tight calf muscles.  Having high arches in your feet.  Starting a new athletic activity. SYMPTOMS The main symptom of this condition is heel pain. Other symptoms include:  Pain that gets worse after activity or exercise.  Pain that is worse in the morning or after resting.  Pain that goes away after you walk for a few minutes. DIAGNOSIS This condition may be diagnosed based on your signs and symptoms. Your health care provider will also do a physical exam to check for:  A tender area on the bottom of your foot.  A high arch in your foot.  Pain when you move your foot.  Difficulty moving your foot. You may also need to have imaging studies to confirm the diagnosis. These can include:  X-rays.  Ultrasound.  MRI. TREATMENT  Treatment for plantar fasciitis depends on the severity of the condition. Your treatment may include:  Rest, ice, and over-the-counter pain medicines to manage your pain.  Exercises to stretch your calves and your plantar fascia.  A splint that holds your foot in a stretched, upward position while you sleep (night splint).  Physical therapy to relieve symptoms and prevent problems in the future.  Cortisone injections to relieve severe pain.  Extracorporeal shock wave therapy (ESWT) to stimulate damaged plantar fascia with electrical impulses. It is often used as a last resort before surgery.  Surgery, if other treatments have not worked after 12 months. HOME CARE INSTRUCTIONS  Take medicines only as directed by your health care provider.  Avoid activities that cause pain.  Roll the bottom of your foot over a bag of ice or a bottle of cold water. Do this  for 20 minutes, 3-4 times a day.  Perform simple stretches as directed by your health care provider.  Try wearing athletic shoes with air-sole or gel-sole cushions or soft shoe inserts.  Wear a night splint while sleeping, if directed by your health care provider.  Keep all follow-up appointments with your health care provider. PREVENTION   Do not perform exercises or activities that cause heel pain.  Consider finding low-impact activities if you continue to have problems.  Lose weight if you need to. The best way to prevent plantar fasciitis is to avoid the activities that aggravate your plantar fascia. SEEK MEDICAL CARE IF:  Your symptoms do not go away after treatment with home care measures.  Your pain gets worse.  Your pain affects your ability to move or do your daily activities. This information is not intended to replace advice given to you by your health care provider. Make sure you discuss any questions you have with your health care provider. Document Released: 01/28/2001 Document Revised: 08/27/2015 Document Reviewed: 03/15/2014 Elsevier Interactive Patient Education  2017 [employer].

## 2016-07-09 NOTE — Progress Notes (Signed)
Pre visit review using our clinic review tool, if applicable. No additional management support is needed unless otherwise documented below in the visit note. 

## 2016-07-09 NOTE — Progress Notes (Signed)
Chief Complaint  Patient presents with  . Annual Exam    HPI: Patient  [name]  56 y.o. comes in today for Preventive Health Care visit  She has hx RA , PE clotting ibn coumadin and anxiety  Has a list   Heel sore I this important worse when walking gin tis  No fever  Injury  Having on going cough poss reflux? Worried about tb since on meds  No sweats wegiht loss or hemoptysis   Has inc body hair always  Had high testosterone level 73 at [facility] consideration of spironolactone and asks my opinion also vaniqua.    Husband on  Topical test but avoiding contact with med .   ? gerd sx at [facility]  On fosomax  Went off alprazolam at night  Not as good sleep  ? Other things to do to help sleep that is safe?  May be [facility] for colonoscopy prev dr Earlean Shawl  ? 5 years and dowent really want to do this  Neg family hx first degrees relative    Requests to see dr Henrene Pastor if possible.     Health Maintenance  Topic Date Due  . COLONOSCOPY  05/19/2016  . MAMMOGRAM  04/23/2018  . PAP SMEAR  06/21/2019  . TETANUS/TDAP  06/01/2023  . INFLUENZA VACCINE  Addressed  . Hepatitis C Screening  Completed  . HIV Screening  Completed   Health Maintenance Review LIFESTYLE:  Exercise:   Tobacco/ETS:n Alcohol: n Sugar beverages:n Sleep:see above  Drug use: no HH of 2    ROS:  Left arm ok slight de rom  Has plate in it see above  GEN/ HEENT: No fever, significant weight changes sweats headaches vision problems hearing changes, CV/ PULM; No chest pain shortness of breath , syncope,edema  change in exercise tolerance. GI /GU: No adominal pain, vomiting, change in bowel habits. No blood in the stool. No significant GU symptoms. SKIN/HEME: ,no acute skin rashes suspicious lesions or bleeding. No lymphadenopathy, nodules, masses.  NEURO/ PSYCH:  No neurologic signs such as weakness numbness. No depression  IMM/ Allergy: No unusual infections.  Allergy .   REST of 12 system review negative except  as per HPI   Past Medical History:  Diagnosis Date  . Adjustment disorder with anxiety   . B12 deficiency due to diet    is a vegetarian  . Chest pain, unspecified   . Chronic anticoagulation   . Headache(784.0)    migraines and head pain  . Hx of varicella    As Child  . Iron deficiency anemia, unspecified   . Otalgia, unspecified   . Other and unspecified hyperlipidemia   . Palpitations   . Personal history of venous thrombosis and embolism   . Recurrent UTI   . Rheumatoid arthritis(714.0)    deveshewar  . Unspecified vitamin D deficiency   . UTI (lower urinary tract infection) 11/28/2011   citrobacter       Past Surgical History:  Procedure Laterality Date  . TYMPANOSTOMY TUBE PLACEMENT  2009   Right    Family History  Problem Relation Age of Onset  . Dementia Father   . Stroke Father   . Hyperlipidemia Father   . Diabetes Father   . Parkinsonism Mother   . Lymphoma      Social History   Social History  . Marital status: Married    Spouse name: N/A  . Number of children: N/A  . Years of education: N/A  Social History Main Topics  . Smoking status: Never Smoker  . Smokeless tobacco: Never Used  . Alcohol use No  . Drug use: No  . Sexual activity: Not Asked   Other Topics Concern  . None   Social History Narrative   Regular Exercise-no   20 yrs in [place]   Son at [employer] [facility] [employer]   From [place].   Married HH  of 3    G1P1             Outpatient Medications Prior to Visit  Medication Sig Dispense Refill  . Calcium Carb-Cholecalciferol (CALTRATE 600+D) 600-800 MG-UNIT TABS Take by mouth.    . Cholecalciferol (VITAMIN D3) 2000 UNITS TABS Take 1 tablet by mouth daily.    . cycloSPORINE (RESTASIS) 0.05 % ophthalmic emulsion Place 1 drop into both eyes daily.      . folic acid (FOLVITE) 1 MG tablet Take 2 tablets (2 mg total) by mouth daily. 180 tablet 3  . FOSAMAX 70 MG tablet TAKE ONE TABLET BY MOUTH ONCE A WEEK 12 tablet 1  . hydroxychloroquine  (PLAQUENIL) 200 MG tablet TAKE 1 TABLET BY MOUTH EVERY MORNING AND 1/2 TABLET EVERY EVENING 135 tablet 0  . methotrexate (RHEUMATREX) 2.5 MG tablet TAKE EIGHT TABLETS BY MOUTH ONCE A WEEK 96 tablet 0  . mineral/vitamin supplement (MULTIGEN) 70 MG TABS tablet TAKE ONE TABLET BY MOUTH ONCE DAILY 30 tablet 5  . NASCOBAL 500 MCG/0.1ML SOLN Place 0.01 mLs (50 mcg total) into the nose once a week. 12 Bottle 0  . SUMAtriptan (IMITREX) 50 MG tablet TAKE ONE TABLET BY MOUTH AS NEEDED. MAY REPEAT IN 2 TO 4 HOURS IF NEEDED. 9 tablet 5  . urea (CARMOL) 40 % CREA APPLY  CREAM TOPICALLY EVERY DAY 29 each 1  . warfarin (COUMADIN) 2.5 MG tablet Take as directed by coumadin clinic 180 tablet 1  . predniSONE (DELTASONE) 1 MG tablet Take 0.5 mg by mouth daily. 1.5 mg daily    . XANAX 0.25 MG tablet TAKE ONE TABLET BY MOUTH AT BEDTIME AS NEEDED FOR ANXIETY FOR SLEEP. 60 tablet 2   No facility-administered medications prior to visit.      EXAM:  BP 106/70 (BP Location: Right Arm, Patient Position: Sitting, Cuff Size: Normal)   Temp 98 F (36.7 C) (Oral)   Ht 5' 2.5" (1.588 m)   Wt 160 lb (72.6 kg)   BMI 28.80 kg/m   Body mass index is 28.8 kg/m. Wt Readings from Last 3 Encounters:  07/09/16 160 lb (72.6 kg)  04/02/16 164 lb (74.4 kg)  01/01/16 157 lb (71.2 kg)    Physical Exam: Vital signs reviewed [id number] is a well-developed well-nourished alert cooperative    who appearsr stated age in no acute distress.  HEENT: normocephalic atraumatic , Eyes: PERRL EOM's full, conjunctiva clear, Nares: paten,t no deformity discharge or tenderness., Ears: no deformity EAC's clear TMs with normal landmarks. Mouth: clear OP, no lesions, edema.  Moist mucous membranes. Dentition in adequate repair. NECK: supple without masses, thyromegaly or bruits. CHEST/PULM:  Clear to auscultation and percussion breath sounds equal no wheeze , rales or rhonchi. No chest wall deformities or tenderness. Breast: normal by  inspection . No dimpling, discharge, masses, tenderness or discharge . CV: PMI is nondisplaced, S1 S2 no gallops, murmurs, rubs. Peripheral pulses are full without delay.No JVD .  ABDOMEN: Bowel sounds normal nontender  No guard or rebound, no hepato splenomegal no CVA tenderness.  No hernia. Extremtities:  No clubbing  cyanosis or edema, no acute joint swelling or redness no focal atrophy no swelling  Mild heel pain on palpation  Left wrist not swollen NEURO:  Oriented x3, cranial nerves 3-12 appear to be intact, no obvious focal weakness,gait within normal limits no abnormal reflexes or asymmetrical SKIN: No acute rashes normal turgor, color, no bruising or petechiae.  Shaved hair chin and arms   No stria   PSYCH: Oriented, good eye contact, no obvious depression mild anxiety , cognition and judgment appear normal. LN: no cervical axillary inguinal adenopathy  Lab Results  Component Value Date   WBC 7.6 07/03/2016   HGB 13.7 07/03/2016   HCT 42.0 07/03/2016   PLT 263.0 07/03/2016   GLUCOSE 101 (H) 07/03/2016   CHOL 187 07/03/2016   TRIG 145.0 07/03/2016   HDL 72.00 07/03/2016   LDLDIRECT 97.3 05/25/2013   LDLCALC 86 07/03/2016   ALT 14 07/03/2016   AST 16 07/03/2016   NA 140 07/03/2016   K 4.3 07/03/2016   CL 107 07/03/2016   CREATININE 0.73 07/03/2016   BUN 9 07/03/2016   CO2 29 07/03/2016   TSH 3.36 07/03/2016   INR 1.9 06/23/2016    BP Readings from Last 3 Encounters:  07/09/16 106/70  04/02/16 107/70  01/01/16 108/66    Lab results reviewed with patient   ASSESSMENT AND PLAN:  Discussed the following assessment and plan:  Visit for preventive health examination  Medication management  Chronic anticoagulation  Iron deficiency  Rheumatoid arthritis of multiple sites with negative rheumatoid factor (HCC)  Hirsutism - long term disc treatments reasonable to try over time   Recurrent dry cough - poss gerd nos other sx but pat worried about TB ok to do ppd when  back in for INR check fu if persist last x ray 44 17  Medically complex patient  Anxiety  B12 deficiency - at goal  Patient Care Team: Burnis Medin, MD as PCP - General Bo Merino, MD (Rheumatology) Richmond Campbell, MD (Gastroenterology) Leta Baptist, MD (Otolaryngology) Larey Seat, MD (Neurology) Rolm Bookbinder, MD (Dermatology) Carolan Clines, MD as Attending Physician (Urology) Christophe Louis, MD as Consulting Physician (Obstetrics and Gynecology) Paralee Cancel, MD as Referring Physician (Hematology and Oncology) Charlotte Crumb, MD as Consulting Physician (Orthopedic Surgery) [facility] Patient Instructions  I think trying this parental lack time is a good idea but may take months to see a good effect. Just make sure your potassium is being monitored. Avoid simple sugars and sweets to make sure your blood sugar doesn't rise.   Continue lifestyle intervention healthy eating and exercise .  Blood test look good today   Heel pain is probably not anything related to your other medical conditions. The most common cause is plantar fasciitis. You can use cool at night padding in your heels in your shoes.   Cough can be from reflux postnasal drainage other irritations. Avoid eating before he goes to bed at night avoid foods that aggravate reflux. If continuing follow-up with me in another 3 months.  Can try melatonin 2-3 hours before sleep. It is not a sleeping pill but can put her brain in the right times. Attend to your sleep hygiene.   Follow-up in 4-6 months depending on how you were doing. If your cough is better make it 6 months otherwise 4 months Get your records regarding your colonoscopy and then we can do a referral consult with Dr. Henrene Pastor. You might be a candidate for cologuard.   Food Choices  for Gastroesophageal Reflux Disease, Adult When you have gastroesophageal reflux disease (GERD), the foods you eat and your eating habits are very important.  Choosing the right foods can help ease the discomfort of GERD. What general guidelines do I need to follow?  Choose fruits, vegetables, whole grains, low-fat dairy products, and low-fat meat, fish, and poultry.  Limit fats such as oils, salad dressings, butter, nuts, and avocado.  Keep a food diary to identify foods that cause symptoms.  Avoid foods that cause reflux. These may be different for different people.  Eat frequent small meals instead of three large meals each day.  Eat your meals slowly, in a relaxed setting.  Limit fried foods.  Cook foods using methods other than frying.  Avoid drinking alcohol.  Avoid drinking large amounts of liquids with your meals.  Avoid bending over or lying down until 2-3 hours after eating. What foods are not recommended? The following are some foods and drinks that may worsen your symptoms: Vegetables  Tomatoes. Tomato juice. Tomato and spaghetti sauce. Chili peppers. Onion and garlic. Horseradish. Fruits  Oranges, grapefruit, and lemon (fruit and juice). Meats  High-fat meats, fish, and poultry. This includes hot dogs, ribs, ham, sausage, salami, and bacon. Dairy  Whole milk and chocolate milk. Sour cream. Cream. Butter. Ice cream. Cream cheese. Beverages  Coffee and tea, with or without caffeine. Carbonated beverages or energy drinks. Condiments  Hot sauce. Barbecue sauce. Sweets/Desserts  Chocolate and cocoa. Donuts. Peppermint and spearmint. Fats and Oils  High-fat foods, including [place] fries and potato chips. Other  Vinegar. Strong spices, such as black pepper, white pepper, red pepper, cayenne, curry powder, cloves, ginger, and chili powder. The items listed above may not be a complete list of foods and beverages to avoid. Contact your dietitian for more information.  This information is not intended to replace advice given to you by your health care provider. Make sure you discuss any questions you have with your health  care provider. Document Released: 05/05/2005 Document Revised: 10/11/2015 Document Reviewed: 03/09/2013 Elsevier Interactive Patient Education  2017 Elsevier Inc.    Plantar Fasciitis Plantar fasciitis is a painful foot condition that affects the heel. It occurs when the band of tissue that connects the toes to the heel bone (plantar fascia) becomes irritated. This can happen after exercising too much or doing other repetitive activities (overuse injury). The pain from plantar fasciitis can range from mild irritation to severe pain that makes it difficult for you to walk or move. The pain is usually worse in the morning or after you have been sitting or lying down for a while. CAUSES This condition may be caused by:  Standing for long periods of time.  Wearing shoes that do not fit.  Doing high-impact activities, including running, aerobics, and ballet.  Being overweight.  Having an abnormal way of walking (gait).  Having tight calf muscles.  Having high arches in your feet.  Starting a new athletic activity. SYMPTOMS The main symptom of this condition is heel pain. Other symptoms include:  Pain that gets worse after activity or exercise.  Pain that is worse in the morning or after resting.  Pain that goes away after you walk for a few minutes. DIAGNOSIS This condition may be diagnosed based on your signs and symptoms. Your health care provider will also do a physical exam to check for:  A tender area on the bottom of your foot.  A high arch in your foot.  Pain when you  move your foot.  Difficulty moving your foot. You may also need to have imaging studies to confirm the diagnosis. These can include:  X-rays.  Ultrasound.  MRI. TREATMENT  Treatment for plantar fasciitis depends on the severity of the condition. Your treatment may include:  Rest, ice, and over-the-counter pain medicines to manage your pain.  Exercises to stretch your calves and your plantar  fascia.  A splint that holds your foot in a stretched, upward position while you sleep (night splint).  Physical therapy to relieve symptoms and prevent problems in the future.  Cortisone injections to relieve severe pain.  Extracorporeal shock wave therapy (ESWT) to stimulate damaged plantar fascia with electrical impulses. It is often used as a last resort before surgery.  Surgery, if other treatments have not worked after 12 months. HOME CARE INSTRUCTIONS  Take medicines only as directed by your health care provider.  Avoid activities that cause pain.  Roll the bottom of your foot over a bag of ice or a bottle of cold water. Do this for 20 minutes, 3-4 times a day.  Perform simple stretches as directed by your health care provider.  Try wearing athletic shoes with air-sole or gel-sole cushions or soft shoe inserts.  Wear a night splint while sleeping, if directed by your health care provider.  Keep all follow-up appointments with your health care provider. PREVENTION   Do not perform exercises or activities that cause heel pain.  Consider finding low-impact activities if you continue to have problems.  Lose weight if you need to. The best way to prevent plantar fasciitis is to avoid the activities that aggravate your plantar fascia. SEEK MEDICAL CARE IF:  Your symptoms do not go away after treatment with home care measures.  Your pain gets worse.  Your pain affects your ability to move or do your daily activities. This information is not intended to replace advice given to you by your health care provider. Make sure you discuss any questions you have with your health care provider. Document Released: 01/28/2001 Document Revised: 08/27/2015 Document Reviewed: 03/15/2014 Elsevier Interactive Patient Education  2017 [employer] K. Sidda Humm M.D.

## 2016-07-10 ENCOUNTER — Telehealth: Payer: Self-pay | Admitting: Family Medicine

## 2016-07-10 ENCOUNTER — Ambulatory Visit (INDEPENDENT_AMBULATORY_CARE_PROVIDER_SITE_OTHER): Payer: BC Managed Care – PPO | Admitting: Psychology

## 2016-07-10 DIAGNOSIS — F411 Generalized anxiety disorder: Secondary | ICD-10-CM

## 2016-07-10 NOTE — Telephone Encounter (Signed)
Burnis Medin, MD  Hulda Humphrey, [place] told patient she could get a PPD for recurrent cough at her next INR Can you help schedule that>? Thanks  Wp   Please help schedule her for PPD.  Thanks!!

## 2016-07-10 NOTE — Telephone Encounter (Signed)
Pt has been sch

## 2016-07-11 ENCOUNTER — Telehealth: Payer: Self-pay | Admitting: Rheumatology

## 2016-07-11 NOTE — Telephone Encounter (Signed)
Patient left message on voice mail to state that she has lab work done @ her pcp and she feels that the labs ordered would be the one Dr Estanislado Pandy needed as well.

## 2016-07-11 NOTE — Telephone Encounter (Signed)
Patient advised we could see her labs in the computer and would have Dr. Estanislado Pandy review. Would call back if she needs any lab before her next appointment.

## 2016-07-17 ENCOUNTER — Telehealth: Payer: Self-pay | Admitting: Rheumatology

## 2016-07-17 NOTE — Telephone Encounter (Signed)
Patient has had a cough on and off for 6-7 months. Patient states it is not a cold. She requested a TB skin test. Patient is on PLQ and MTX 20 mg per week. Patient states she is also having some fatigue. Patient advised it is okay to have a TB skin test performed.

## 2016-07-17 NOTE — Telephone Encounter (Signed)
ok 

## 2016-07-17 NOTE — Telephone Encounter (Signed)
Patient states she has a cough that has been going on for a while now and her PCP would like her to get a TB test done and due to her immune system and the medications she is on, patient would like to know if this is ok to do.

## 2016-07-21 ENCOUNTER — Ambulatory Visit (INDEPENDENT_AMBULATORY_CARE_PROVIDER_SITE_OTHER): Payer: BC Managed Care – PPO

## 2016-07-21 ENCOUNTER — Ambulatory Visit (INDEPENDENT_AMBULATORY_CARE_PROVIDER_SITE_OTHER): Payer: BC Managed Care – PPO | Admitting: General Practice

## 2016-07-21 DIAGNOSIS — R760 Raised antibody titer: Secondary | ICD-10-CM

## 2016-07-21 DIAGNOSIS — Z5181 Encounter for therapeutic drug level monitoring: Secondary | ICD-10-CM

## 2016-07-21 DIAGNOSIS — Z111 Encounter for screening for respiratory tuberculosis: Secondary | ICD-10-CM

## 2016-07-21 DIAGNOSIS — R768 Other specified abnormal immunological findings in serum: Secondary | ICD-10-CM

## 2016-07-21 LAB — POCT INR: INR: 1.5

## 2016-07-21 NOTE — Patient Instructions (Signed)
Pre visit review using our clinic review tool, if applicable. No additional management support is needed unless otherwise documented below in the visit note. 

## 2016-07-21 NOTE — Progress Notes (Signed)
Tuberculin skin test applied to Left ventral forearm. Explained to patient to return to office within 48-72 hours for reading. Patient agreed.

## 2016-07-23 NOTE — Progress Notes (Signed)
PPD Reading Note  PPD read and results entered in [employer].  Result: 0 mm induration.  Interpretation: negative  If test not read within 48-72 hours of initial placement, patient advised to repeat in other arm 1-3 weeks after this test.  Allergic reaction: no

## 2016-07-24 ENCOUNTER — Ambulatory Visit (INDEPENDENT_AMBULATORY_CARE_PROVIDER_SITE_OTHER): Payer: BC Managed Care – PPO | Admitting: Psychology

## 2016-07-24 DIAGNOSIS — F411 Generalized anxiety disorder: Secondary | ICD-10-CM

## 2016-08-01 ENCOUNTER — Other Ambulatory Visit: Payer: Self-pay | Admitting: Internal Medicine

## 2016-08-01 NOTE — Telephone Encounter (Signed)
Pt would like a refill. Please advise  

## 2016-08-07 ENCOUNTER — Ambulatory Visit: Payer: BC Managed Care – PPO | Admitting: Psychology

## 2016-08-18 ENCOUNTER — Ambulatory Visit (INDEPENDENT_AMBULATORY_CARE_PROVIDER_SITE_OTHER): Payer: BC Managed Care – PPO | Admitting: General Practice

## 2016-08-18 DIAGNOSIS — Z5181 Encounter for therapeutic drug level monitoring: Secondary | ICD-10-CM | POA: Diagnosis not present

## 2016-08-18 DIAGNOSIS — R768 Other specified abnormal immunological findings in serum: Secondary | ICD-10-CM

## 2016-08-18 DIAGNOSIS — R760 Raised antibody titer: Secondary | ICD-10-CM

## 2016-08-18 NOTE — Patient Instructions (Signed)
Pre visit review using our clinic review tool, if applicable. No additional management support is needed unless otherwise documented below in the visit note. 

## 2016-08-21 ENCOUNTER — Ambulatory Visit (INDEPENDENT_AMBULATORY_CARE_PROVIDER_SITE_OTHER): Payer: BC Managed Care – PPO | Admitting: Psychology

## 2016-08-21 DIAGNOSIS — F411 Generalized anxiety disorder: Secondary | ICD-10-CM

## 2016-08-25 NOTE — Progress Notes (Signed)
Office Visit Note  Patient: [name]             Date of Birth: [birth year]           MRN: [id number]             PCP: Lottie Dawson, MD Referring: Burnis Medin, MD Visit Date: 09/03/2016 Occupation: @GUAROCC @    Subjective:  Lower back pain.   History of Present Illness: [name] is a 56 y.o. female with history of sero positive rheumatoid arthritis. She states she's been off prednisone for 3 months now. She has not had any joint swelling in her hands or feet. She does not have much knee discomfort currently. Reports some discomfort in the right heel which limits her walking. She's been also having some lower back pain. She denies any radiculopathy. She complains of having a dry hacking cough for last 1 year off and on. She states the PPD done at the PCPs office was negative. She was started on Spiriva lactone by her GYN due to hair growth. She states that with pressure has been running low and causes dizziness intermittently.  Activities of Daily Living:  Patient reports morning stiffness for 2 minutes.   Patient Reports nocturnal pain.  Difficulty dressing/grooming: Denies Difficulty climbing stairs: Denies Difficulty getting out of chair: Denies Difficulty using hands for taps, buttons, cutlery, and/or writing: Denies   Review of Systems  Constitutional: Negative for fatigue, night sweats, weight gain, weight loss and weakness.  HENT: Negative for mouth sores, trouble swallowing, trouble swallowing, mouth dryness and nose dryness.   Eyes: Negative for pain, redness, visual disturbance and dryness.  Respiratory: Positive for cough. Negative for shortness of breath and difficulty breathing.   Cardiovascular: Negative for chest pain, palpitations, hypertension, irregular heartbeat and swelling in legs/feet.  Gastrointestinal: Negative for blood in stool, constipation and diarrhea.  Endocrine: Negative for increased urination.  Genitourinary: Negative  for vaginal dryness.  Musculoskeletal: Positive for arthralgias, joint pain and morning stiffness. Negative for joint swelling, myalgias, muscle weakness, muscle tenderness and myalgias.  Skin: Negative for color change, rash, hair loss, skin tightness, ulcers and sensitivity to sunlight.  Allergic/Immunologic: Negative for susceptible to infections.  Neurological: Negative for dizziness, memory loss and night sweats.  Hematological: Negative for swollen glands.  Psychiatric/Behavioral: Negative for depressed mood and sleep disturbance. The patient is nervous/anxious.     PMFS History:  Patient Active Problem List   Diagnosis Date Noted  . Migraine 03/31/2016  . Anxiety 03/31/2016  . High risk medication use 03/31/2016  . Primary osteoarthritis of both feet 03/31/2016  . Primary osteoarthritis of both knees 03/31/2016  . Stiffness of left hand joint 11/27/2014  . Elevated IOP 06/09/2014  . Long term current use of systemic steroids 06/09/2014  . Medically complex patient 06/09/2014  . Sore throat 01/09/2014  . Current use of steroid medication 01/09/2014  . Xerosis of skin 12/07/2013  . Encounter for therapeutic drug monitoring 06/09/2013  . B12 deficiency 05/31/2013  . Iron deficiency 05/31/2013  . Visit for preventive health examination 05/25/2012  . Mammogram abnormal 04/12/2012  . 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 Vaginal atrophy 01/23/2012  . History of recurrent UTI (urinary tract infection) 01/23/2012  . Osteoporosis 04/29/2011  . Nonspecific abnormal results of liver function study 12/08/2010  . Chronic anticoagulation   . Anticoagulant long-term use 07/15/2010  . 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 CHEST PAIN-UNSPECIFIED 08/26/2008  . PALPITATIONS, RECURRENT 07/24/2008  . Vitamin D deficiency  05/30/2008  . HYPERLIPIDEMIA 05/30/2008  . IRON DEFICIENCY 05/30/2008  . Adjustment disorder with anxiety 05/30/2008  . Rheumatoid arthritis ([place]) 05/30/2008  .  PULMONARY EMBOLISM, HX OF 05/30/2008    Past Medical History:  Diagnosis Date  . Adjustment disorder with anxiety   . B12 deficiency due to diet    is a vegetarian  . Chest pain, unspecified   . Chronic anticoagulation   . Headache(784.0)    migraines and head pain  . Hx of varicella    As Child  . Iron deficiency anemia, unspecified   . Otalgia, unspecified   . Other and unspecified hyperlipidemia   . Palpitations   . Personal history of venous thrombosis and embolism   . Recurrent UTI   . Rheumatoid arthritis(714.0)    deveshewar  . Unspecified vitamin D deficiency   . UTI (lower urinary tract infection) 11/28/2011   citrobacter       Family History  Problem Relation Age of Onset  . Dementia Father   . Stroke Father   . Hyperlipidemia Father   . Diabetes Father   . Parkinsonism Mother   . Lymphoma     Past Surgical History:  Procedure Laterality Date  . TYMPANOSTOMY TUBE PLACEMENT  2009   Right   Social History   Social History Narrative   Regular Exercise-no   20 yrs in [place]   Son at [employer]   From [place].   Married HH  of 3    G1P1              Objective: Vital Signs: BP (!) 92/56   Pulse 78   Resp 16   Wt 162 lb (73.5 kg)   BMI 29.16 kg/m    Physical Exam  Constitutional: She is oriented to person, place, and time. She appears well-developed and well-nourished.  HENT:  Head: Normocephalic and atraumatic.  Eyes: Conjunctivae and EOM are normal.  Neck: Normal range of motion.  Cardiovascular: Normal rate, regular rhythm, normal heart sounds and intact distal pulses.   Pulmonary/Chest: Effort normal and breath sounds normal.  Abdominal: Soft. Bowel sounds are normal.  Lymphadenopathy:    She has no cervical adenopathy.  Neurological: She is alert and oriented to person, place, and time.  Skin: Skin is warm and dry. Capillary refill takes less than 2 seconds.  Psychiatric: She has a normal mood and affect. Her behavior is normal.  Nursing note  and vitals reviewed.    Musculoskeletal Exam: C-spine and thoracic spine good range of motion she has limited range of motion of her lumbar spine. Shoulder joints elbow joints wrist joint MCPs PIPs DIPs with good range of motion with no synovitis. Hip joints knee joints ankles MTPs PIPs DIPs are good range of motion with no synovitis.  CDAI Exam: CDAI Homunculus Exam:   Joint Counts:  CDAI Tender Joint count: 0 CDAI Swollen Joint count: 0  Global Assessments:  Patient Global Assessment: 3 Provider Global Assessment: 2  CDAI Calculated Score: 5    Investigation: No additional findings.   CBC    Component Value Date/Time   WBC 7.6 07/03/2016 0931   RBC 4.86 07/03/2016 0931   HGB 13.7 07/03/2016 0931   HGB 13.7 02/02/2007 1434   HCT 42.0 07/03/2016 0931   HCT 39.4 02/02/2007 1434   PLT 263.0 07/03/2016 0931   PLT 393 02/02/2007 1434   MCV 86.5 07/03/2016 0931   MCV 80.4 (L) 02/02/2007 1434   MCH 27.9 03/26/2016 3149  MCHC 32.7 07/03/2016 0931   RDW 13.6 07/03/2016 0931   RDW 12.7 02/02/2007 1434   LYMPHSABS 2.6 07/03/2016 0931   LYMPHSABS 2.7 02/02/2007 1434   MONOABS 0.6 07/03/2016 0931   MONOABS 0.6 02/02/2007 1434   EOSABS 0.1 07/03/2016 0931   EOSABS 0.1 02/02/2007 1434   BASOSABS 0.0 07/03/2016 0931   BASOSABS 0.0 02/02/2007 1434   CMP     Component Value Date/Time   NA 140 07/03/2016 0931   K 4.3 07/03/2016 0931   CL 107 07/03/2016 0931   CO2 29 07/03/2016 0931   GLUCOSE 101 (H) 07/03/2016 0931   BUN 9 07/03/2016 0931   CREATININE 0.73 07/03/2016 0931   CREATININE 0.64 03/26/2016 0942   CALCIUM 9.0 07/03/2016 0931   PROT 6.8 07/03/2016 0931   ALBUMIN 3.9 07/03/2016 0931   AST 16 07/03/2016 0931   ALT 14 07/03/2016 0931   ALKPHOS 68 07/03/2016 0931   BILITOT 0.7 07/03/2016 0931   GFRNONAA >89 03/26/2016 0942   GFRAA >89 03/26/2016 0942   Imaging: No results found.  Speciality Comments: No specialty comments available.    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 She had no synovitis on examination today she had been off prednisone without any flare of her symptoms.  High risk medication use - Methotrexate, folic acid, Plaquenil. She's been tolerating her medications well and is controlling her symptoms. We will check her labs again in May and then every 3 months to monitor for drug toxicity.  Dry mouth - Positive ANA, sicca symptoms with history of parotitis. Over-the-counter medications have been helpful.  Cough: She complains of dry cough for the last 1 year. I will refer her to pulmonary for that.  Primary osteoarthritis of both knees. She is some chronic discomfort  Primary osteoarthritis of both feet. Proper fitting shoes were discussed.  Vitamin D deficiency: She is on vitamin D supplement.  Osteoporosis - July 2016 T score -4.2 radius, on Fosamax:  PULMONARY EMBOLISM, HX OF - On Coumadin  History of migraine: She has been taking medications a when necessary basis.  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    Orders: No orders of the defined types were placed in this encounter.  No orders of the defined types were placed in this encounter.   Face-to-face time spent with patient was 40 minutes. 50% of time was spent in counseling and coordination of care.  Follow-Up  Instructions: No Follow-up on file.   Bo Merino, MD  Note - This record has been created using Editor, commissioning.  Chart creation errors have been sought, but may not always  have been located. Such creation errors do not reflect on  the standard of medical care.

## 2016-09-01 ENCOUNTER — Other Ambulatory Visit: Payer: Self-pay | Admitting: Internal Medicine

## 2016-09-01 ENCOUNTER — Telehealth: Payer: Self-pay | Admitting: Internal Medicine

## 2016-09-01 NOTE — Telephone Encounter (Signed)
Pt called in and stated that her Thyroid test is no longer covered by her insurance as a screening.  Do you see anything in the notes that would suggest a dx code that could be attached besides the Z00.00?  Pt states if it is what it is then she will just pay but she knows that Dr. Regis Bill likes to check her thyroid each year.

## 2016-09-01 NOTE — Telephone Encounter (Signed)
This was done prior to her CPE.  I did go back to most recent notes and nothing is noted regarding the TSH.  The only thing we have to go by is the Z00.00 that is on the order.  Sorry. Dawn

## 2016-09-03 ENCOUNTER — Ambulatory Visit (INDEPENDENT_AMBULATORY_CARE_PROVIDER_SITE_OTHER): Payer: BC Managed Care – PPO | Admitting: Rheumatology

## 2016-09-03 ENCOUNTER — Encounter: Payer: Self-pay | Admitting: Rheumatology

## 2016-09-03 VITALS — BP 92/56 | HR 78 | Resp 16 | Wt 162.0 lb

## 2016-09-03 DIAGNOSIS — R05 Cough: Secondary | ICD-10-CM

## 2016-09-03 DIAGNOSIS — E559 Vitamin D deficiency, unspecified: Secondary | ICD-10-CM | POA: Diagnosis not present

## 2016-09-03 DIAGNOSIS — Z86718 Personal history of other venous thrombosis and embolism: Secondary | ICD-10-CM

## 2016-09-03 DIAGNOSIS — M170 Bilateral primary osteoarthritis of knee: Secondary | ICD-10-CM

## 2016-09-03 DIAGNOSIS — R768 Other specified abnormal immunological findings in serum: Secondary | ICD-10-CM | POA: Diagnosis not present

## 2016-09-03 DIAGNOSIS — Z79899 Other long term (current) drug therapy: Secondary | ICD-10-CM | POA: Diagnosis not present

## 2016-09-03 DIAGNOSIS — R682 Dry mouth, unspecified: Secondary | ICD-10-CM | POA: Diagnosis not present

## 2016-09-03 DIAGNOSIS — Z789 Other specified health status: Secondary | ICD-10-CM

## 2016-09-03 DIAGNOSIS — M810 Age-related osteoporosis without current pathological fracture: Secondary | ICD-10-CM

## 2016-09-03 DIAGNOSIS — M19071 Primary osteoarthritis, right ankle and foot: Secondary | ICD-10-CM

## 2016-09-03 DIAGNOSIS — M19072 Primary osteoarthritis, left ankle and foot: Secondary | ICD-10-CM

## 2016-09-03 DIAGNOSIS — Z8639 Personal history of other endocrine, nutritional and metabolic disease: Secondary | ICD-10-CM

## 2016-09-03 DIAGNOSIS — Z8669 Personal history of other diseases of the nervous system and sense organs: Secondary | ICD-10-CM

## 2016-09-03 DIAGNOSIS — R760 Raised antibody titer: Secondary | ICD-10-CM

## 2016-09-03 DIAGNOSIS — H40053 Ocular hypertension, bilateral: Secondary | ICD-10-CM

## 2016-09-03 DIAGNOSIS — M0609 Rheumatoid arthritis without rheumatoid factor, multiple sites: Secondary | ICD-10-CM | POA: Diagnosis not present

## 2016-09-03 DIAGNOSIS — R059 Cough, unspecified: Secondary | ICD-10-CM

## 2016-09-03 DIAGNOSIS — G43809 Other migraine, not intractable, without status migrainosus: Secondary | ICD-10-CM | POA: Diagnosis not present

## 2016-09-03 MED ORDER — METHOTREXATE 2.5 MG PO TABS: ORAL_TABLET | ORAL | 0 refills | Status: DC

## 2016-09-03 MED ORDER — FOLIC ACID 1 MG PO TABS: 2.0000 mg | ORAL_TABLET | Freq: Every day | ORAL | 3 refills | Status: DC

## 2016-09-03 NOTE — Progress Notes (Signed)
Rheumatology Medication Review by a Pharmacist Does the patient feel that his/her medications are working for him/her?  Yes Has the patient been experiencing any side effects to the medications prescribed?  No Does the patient have any problems obtaining medications?  No  Issues to address at subsequent visits: None   Pharmacist comments:  [name] is a pleasant 56 yo F who presents for follow up of rheumatoid arthritis.  She is currently taking methotrexate 8 tablets weekly, folic acid 2 mg daily, and hydroxychloroquine 200 mg BID Monday through Friday.   Most recent standing labs were normal on 07/03/16.  She will be due for standing labs again in May 2018.  Most recent hydroxychloroquine eye exam was normal on 06/12/16.  Patient asked about reducing one of her medications.  Discussed with Dr. Estanislado Pandy.  Patient will try reducing hydroxychloroquine to 200 mg daily.  Patient denies any further questions regarding her medications at this time.    Elisabeth Most, Pharm.D., BCPS, CPP Clinical Pharmacist Pager: [phone] Phone: [phone] 09/03/2016 11:53 AM

## 2016-09-03 NOTE — Patient Instructions (Signed)
Standing Labs We placed an order today for your standing lab work.    Please come back and get your standing labs in May and every 3 months  We have open lab Monday through Friday from 8:30-11:30 AM and 1:30-4 PM at the office of Dr. Tresa Moore, PA.   The office is located at [address] [place], [place], [place] [address] No appointment is necessary.   Labs are drawn by [facility].  You may receive a bill from [facility] for your lab work.

## 2016-09-15 ENCOUNTER — Ambulatory Visit (INDEPENDENT_AMBULATORY_CARE_PROVIDER_SITE_OTHER): Payer: BC Managed Care – PPO | Admitting: General Practice

## 2016-09-15 DIAGNOSIS — R768 Other specified abnormal immunological findings in serum: Secondary | ICD-10-CM

## 2016-09-15 DIAGNOSIS — Z5181 Encounter for therapeutic drug level monitoring: Secondary | ICD-10-CM

## 2016-09-15 DIAGNOSIS — R760 Raised antibody titer: Secondary | ICD-10-CM

## 2016-09-15 LAB — POCT INR: INR: 1.9

## 2016-09-15 NOTE — Patient Instructions (Signed)
Pre visit review using our clinic review tool, if applicable. No additional management support is needed unless otherwise documented below in the visit note. 

## 2016-09-18 ENCOUNTER — Ambulatory Visit (INDEPENDENT_AMBULATORY_CARE_PROVIDER_SITE_OTHER): Payer: BC Managed Care – PPO | Admitting: Psychology

## 2016-09-18 DIAGNOSIS — F411 Generalized anxiety disorder: Secondary | ICD-10-CM | POA: Diagnosis not present

## 2016-09-29 ENCOUNTER — Encounter: Payer: Self-pay | Admitting: Internal Medicine

## 2016-09-29 ENCOUNTER — Ambulatory Visit (INDEPENDENT_AMBULATORY_CARE_PROVIDER_SITE_OTHER): Payer: BC Managed Care – PPO | Admitting: Internal Medicine

## 2016-09-29 DIAGNOSIS — R05 Cough: Secondary | ICD-10-CM

## 2016-09-29 DIAGNOSIS — Z86718 Personal history of other venous thrombosis and embolism: Secondary | ICD-10-CM

## 2016-09-29 DIAGNOSIS — R053 Chronic cough: Secondary | ICD-10-CM

## 2016-09-29 LAB — NITRIC OXIDE: NITRIC OXIDE: 9

## 2016-09-29 NOTE — Assessment & Plan Note (Addendum)
Unclear cause.  No evidence of asthma.  Need to rule out interference of methotrexate or rheumatoid arthritis on lung Possibile that fosfamax related acid reflux could be contributing to cough Very low prob for TB If all of this negative, consider GERD mediated cough neuropathy as cause for cough  Plan D/w PCP Panosh, Standley Brooking, MD and stop fosfamax For acid reflux consider taking OTC ranitidine or prilosec 20mg  on empty stomach   - can wait on this till we see you again Await quantiferon gold blood test for TB Do Pre-bd and post-bd spiro and dlco only. No lung volume Do HRCT chest supine and prone D/w PCP Panosh, Standley Brooking, MD and stop fosfamax  Followup Return to see me or APP after tests; if these are negative will need to entertain possibilty of acid reflx

## 2016-09-29 NOTE — Addendum Note (Signed)
Addended by: Collier Salina on: 09/29/2016 01:05 PM   Modules accepted: Orders

## 2016-09-29 NOTE — Assessment & Plan Note (Signed)
Refer to Dr Beryle Beams now or later; your choice

## 2016-09-29 NOTE — Patient Instructions (Signed)
PULMONARY EMBOLISM, HX OF Refer to Dr Beryle Beams now or later; your choice   Chronic cough Unclear cause.  No evidence of asthma.  Need to rule out interference of methotrexate or rheumatoid arthritis on lung Possibile that fosfamax related acid reflux could be contributing to cough Very low prob for TB  Plan D/w PCP Panosh, Standley Brooking, MD and stop fosfamax For acid reflux consider taking OTC ranitidine or prilosec 20mg  on empty stomach   - can wait on this till we see you again Await quantiferon gold blood test for TB Do Pre-bd and post-bd spiro and dlco only. No lung volume Do HRCT chest supine and prone D/w PCP Panosh, Standley Brooking, MD and stop fosfamax  Followup Return to see me or APP after tests; if these are negative will need to entertain possibilty of acid reflx

## 2016-09-29 NOTE — Progress Notes (Signed)
Subjective:    Patient ID: [name], female    DOB: 06-Jun-1960, 56 y.o.   MRN: [id number]  PCP Panosh, Standley Brooking, MD   HPI  IOV 09/29/2016  Chief Complaint  Patient presents with  . Pulmonary Consult    Pt referred by Dr. Estanislado Pandy for chronic cough x 1 year. Pt states the cough is dry. Pt denies chest congestion, f/c/s and CP/tightness.     [name] 56 y.o. [name] [id number]  [name] female with    # seropositive rheumatoid arthritis follows with Dr Bo Merino. Chart review shows that last visit with her office wasn't April 2018. She reports a diagnosis of rheumatoid arthritis in 2011.  She had been off prednisone for 3 months at least as of April 2018.Marland Kitchen Chart review shows patient's on methotrexate and hydroxychloroquine. She's been on methotrexate at least since 2011 according to history. This is weekly. She has not been on any other stronger immunomodulators  #She also lso history of pulmonary embolism for which she is on Coumadin. There is a diagnosis in [name] in 2006. She showed me the report from [facility] imaging confirming lower lobe bilateral pulmonary emboli along with small lung nodules. although the only CT angiogram chest in our health system was in 12/25/2006 that was negative for pulmonary embolism. She did have elevated d-dimer at that time. She is followed at [facility] since then every few years. Even though she has had only one single episode of pulmonary embolism which she thinks might be idiopathic versus related to per chart anti-cardiolipinb 5. She opted to be on lifelong anticoagulation. She is even been reluctant to switch to low dose elqiuuis although this was offered as an option at [employer] [facility] couple of years ago. Her target INR is 1.5. She is open to seeing Dr. Karel Jarvis to see if she should continue on anticoagulation. She self describes herself as extremely anxious and is really worried about having a  repeat pulmonary embolism.   #Current history is one of chronic cough for the last 1 year. It is mild to moderate in intensity. It is present mostly in the daytime particularly in the evenings. However it does not wake her up from her sleep at night. She is extremely worried with a cough in the setting of rheumatoid arthritis and immunosuppression. Last in July 2017 she did take a trip to [place] for 10 days and during this time the cough resolved but upon entering back to the [place] the cough record. It is stable it is not progressive but it is persistent and it is annoying. Talking a lot makes her cough worse. She describes as of this anxious and she is observed in the office to be talking quite a bit. During this time she is coughing and clearing the throat. She does admit to the fact that the cough might be coming from the throat or the upper chest. There is a tickle in the throat the cough is associated with clearing of the throat.  In terms of cough associated specific history -ACE inhibitor use: Medication review does not show this - Acid reflux: She reports acid reflux for 1 year associated with the use of Fosamax which she is on for 4 years. Acid reflux is mild. She is not on any medication for this. - Sinus drainage and allergies: She denies. She's not interested in CT scan of the chest for the sinuses. - Asthma: She denies any prior history of asthma current  wheezing. Exhaled nitric oxide today in the office was 9 ppb and normal  . Chest x-ray 08/02/2015: Personally visualized clear lung fields without any infiltrates. She has QUANT GOLD pending with Dr Bronson Curb  General: does not like lot of tests or medications     Dr Lorenza Cambridge Reflux Symptom Index (> 13-15 suggestive of LPR cough) 0 -> 5  =  none ->severe problem 09/29/2016   Hoarseness of problem with voice 1  Clearing  Of Throat 1  Excess throat mucus or feeling of post nasal drip 0  Difficulty swallowing food, liquid or  tablets 0  Cough after eating or lying down 0  Breathing difficulties or choking episodes 0  Troublesome or annoying cough 3  Sensation of something sticking in throat or lump in throat 0  Heartburn, chest pain, indigestion, or stomach acid coming up 2  TOTAL 7    Results for Krystal, [name] (MRN [id number]) as of 09/29/2016 10:52  Ref. Range 05/26/2016 00:00 06/23/2016 00:00 07/03/2016 09:31 07/21/2016 00:00 09/15/2016 00:00  Creatinine Latest Ref Range: 0.40 - 1.20 mg/dL   0.73    Results for Krystal, [name] (MRN [id number]) as of 09/29/2016 10:52  Ref. Range 05/26/2016 00:00 06/23/2016 00:00 07/03/2016 09:31 07/21/2016 00:00 09/15/2016 00:00  Hemoglobin Latest Ref Range: 12.0 - 15.0 g/dL   13.7    Results for Krystal, [name] (MRN [id number]) as of 09/29/2016 10:52  Ref. Range 05/26/2016 00:00 06/23/2016 00:00 07/03/2016 09:31 07/21/2016 00:00 09/15/2016 00:00  Eosinophils Absolute Latest Ref Range: 0.0 - 0.7 K/uL   0.1      has a past medical history of Adjustment disorder with anxiety; B12 deficiency due to diet; Chest pain, unspecified; Chronic anticoagulation; Headache(784.0); varicella; Iron deficiency anemia, unspecified; Otalgia, unspecified; Other and unspecified hyperlipidemia; Palpitations; Personal history of venous thrombosis and embolism; Recurrent UTI; Rheumatoid arthritis(714.0); Unspecified vitamin D deficiency; and UTI (lower urinary tract infection) (11/28/2011).   reports that she has never smoked. She has never used smokeless tobacco.  Past Surgical History:  Procedure Laterality Date  . ORIF ULNAR FRACTURE    . TYMPANOSTOMY TUBE PLACEMENT  2009   Right    Allergies  Allergen Reactions  . Boniva [Ibandronate Sodium]     Heartburn  . Ivp Dye [Iodinated Diagnostic Agents] Swelling    Mouth swelling.  . Risedronate Sodium [Risedronate Sodium] Other (See Comments)    Foot and bone pain and fatigue    Immunization History  Administered Date(s) Administered  .  Influenza,inj,Quad PF,36+ Mos 04/19/2013, 06/27/2015, 03/05/2016  . PPD Test 07/21/2016  . Tdap 05/31/2013    Family History  Problem Relation Age of Onset  . Dementia Father   . Stroke Father   . Hyperlipidemia Father   . Diabetes Father   . Parkinsonism Mother   . Lymphoma Unknown      Current Outpatient Prescriptions:  .  Calcium Carb-Cholecalciferol (CALTRATE 600+D) 600-800 MG-UNIT TABS, Take by mouth., Disp: , Rfl:  .  Cholecalciferol (VITAMIN D3) 2000 UNITS TABS, Take 1 tablet by mouth daily., Disp: , Rfl:  .  COUMADIN 2.5 MG tablet, TAKE AS DIRECTED BY  COUMADIN  CLINIC, Disp: 180 tablet, Rfl: 1 .  cycloSPORINE (RESTASIS) 0.05 % ophthalmic emulsion, Place 1 drop into both eyes daily.  , Disp: , Rfl:  .  folic acid (FOLVITE) 1 MG tablet, Take 2 tablets (2 mg total) by mouth daily., Disp: 180 tablet, Rfl: 3 .  FOSAMAX 70 MG tablet, TAKE ONE TABLET BY MOUTH ONCE A WEEK, Disp: 12  tablet, Rfl: 1 .  hydroxychloroquine (PLAQUENIL) 200 MG tablet, TAKE 1 TABLET BY MOUTH EVERY MORNING AND 1/2 TABLET EVERY EVENING (Patient taking differently: take daily), Disp: 135 tablet, Rfl: 0 .  methotrexate (RHEUMATREX) 2.5 MG tablet, TAKE EIGHT TABLETS BY MOUTH ONCE A WEEK, Disp: 96 tablet, Rfl: 0 .  mineral/vitamin supplement (MULTIGEN) 70 MG TABS tablet, TAKE ONE TABLET BY MOUTH ONCE DAILY, Disp: 30 tablet, Rfl: 5 .  NASCOBAL 500 MCG/0.1ML SOLN, Place 0.01 mLs (50 mcg total) into the nose once a week., Disp: 12 Bottle, Rfl: 0 .  SUMAtriptan (IMITREX) 50 MG tablet, TAKE ONE TABLET BY MOUTH AS NEEDED. MAY REPEAT IN 2 TO 4 HOURS IF NEEDED., Disp: 9 tablet, Rfl: 5 .  urea (CARMOL) 40 % CREA, APPLY  CREAM TOPICALLY EVERY DAY, Disp: 29 each, Rfl: 1   Review of Systems  Constitutional: Negative for fever and unexpected weight change.  HENT: Negative for congestion, dental problem, ear pain, nosebleeds, postnasal drip, rhinorrhea, sinus pressure, sneezing, sore throat and trouble swallowing.   Eyes:  Negative for redness and itching.  Respiratory: Positive for cough. Negative for chest tightness, shortness of breath and wheezing.   Cardiovascular: Negative for palpitations and leg swelling.  Gastrointestinal: Negative for nausea and vomiting.  Genitourinary: Negative for dysuria.  Musculoskeletal: Negative for joint swelling.  Skin: Negative for rash.  Neurological: Negative for headaches.  Hematological: Does not bruise/bleed easily.  Psychiatric/Behavioral: Negative for dysphoric mood. The patient is not nervous/anxious.        Objective:   Physical Exam  Constitutional: She is oriented to person, place, and time. She appears well-developed and well-nourished. No distress.  HENT:  Head: Normocephalic and atraumatic.  Right Ear: External ear normal.  Left Ear: External ear normal.  Mouth/Throat: Oropharynx is clear and moist. No oropharyngeal exudate.  clars throat Coughs +  Eyes: Conjunctivae and EOM are normal. Pupils are equal, round, and reactive to light. Right eye exhibits no discharge. Left eye exhibits no discharge. No scleral icterus.  Neck: Normal range of motion. Neck supple. No JVD present. No tracheal deviation present. No thyromegaly present.  Cardiovascular: Normal rate, regular rhythm, normal heart sounds and intact distal pulses.  Exam reveals no gallop and no friction rub.   No murmur heard. Pulmonary/Chest: Effort normal and breath sounds normal. No respiratory distress. She has no wheezes. She has no rales. She exhibits no tenderness.  Abdominal: Soft. Bowel sounds are normal. She exhibits no distension and no mass. There is no tenderness. There is no rebound and no guarding.  Musculoskeletal: Normal range of motion. She exhibits no edema or tenderness.  Lymphadenopathy:    She has no cervical adenopathy.  Neurological: She is alert and oriented to person, place, and time. She has normal reflexes. No cranial nerve deficit. She exhibits normal muscle tone.  Coordination normal.  Skin: Skin is warm and dry. No rash noted. She is not diaphoretic. No erythema. No pallor.  Psychiatric: She has a normal mood and affect. Her behavior is normal. Judgment and thought content normal.  Mildly anxious Talks a lot  Vitals reviewed.   Vitals:   09/29/16 1046  BP: 100/68  Pulse: 63  SpO2: 98%  Weight: 164 lb 6.4 oz (74.6 kg)  Height: 5\' 4"  (1.626 m)    Estimated body mass index is 28.22 kg/m as calculated from the following:   Height as of this encounter: 5\' 4"  (1.626 m).   Weight as of this encounter: 164 lb 6.4 oz (74.6  kg).       Assessment & Plan:     ICD-9-CM ICD-10-CM   1. PULMONARY EMBOLISM, HX OF V12.51 Z86.718   2. Chronic cough 786.2 R05     PULMONARY EMBOLISM, HX OF Refer to Dr Beryle Beams now or later; your choice   Chronic cough Unclear cause.  No evidence of asthma.  Need to rule out interference of methotrexate or rheumatoid arthritis on lung Possibile that fosfamax related acid reflux could be contributing to cough Very low prob for TB If all of this negative, consider GERD mediated cough neuropathy as cause for cough  Plan D/w PCP Panosh, Standley Brooking, MD and stop fosfamax For acid reflux consider taking OTC ranitidine or prilosec 20mg  on empty stomach   - can wait on this till we see you again Await quantiferon gold blood test for TB Do Pre-bd and post-bd spiro and dlco only. No lung volume Do HRCT chest supine and prone D/w PCP Panosh, Standley Brooking, MD and stop fosfamax  Followup Return to see me or APP after tests; if these are negative will need to entertain possibilty of acid reflx    Dr. Brand Males, M.D., [facility].C.P Pulmonary and Critical Care Medicine Staff Physician [place] [facility] Pager: [phone], If no answer or between  15:00h - 7:00h: call [phone]  09/29/2016 12:35 PM

## 2016-10-01 ENCOUNTER — Telehealth: Payer: Self-pay | Admitting: Rheumatology

## 2016-10-01 ENCOUNTER — Other Ambulatory Visit: Payer: Self-pay

## 2016-10-01 DIAGNOSIS — Z79899 Other long term (current) drug therapy: Secondary | ICD-10-CM

## 2016-10-01 LAB — COMPLETE METABOLIC PANEL WITH GFR
ALT: 15 U/L (ref 6–29)
AST: 18 U/L (ref 10–35)
Albumin: 3.8 g/dL (ref 3.6–5.1)
Alkaline Phosphatase: 80 U/L (ref 33–130)
BUN: 7 mg/dL (ref 7–25)
CO2: 26 mmol/L (ref 20–31)
Calcium: 9.1 mg/dL (ref 8.6–10.4)
Chloride: 106 mmol/L (ref 98–110)
Creat: 0.8 mg/dL (ref 0.50–1.05)
GFR, Est African American: >89 mL/min (ref >=60)
GFR, Est Non African American: 82 mL/min (ref >=60)
GLUCOSE: 92 mg/dL (ref 65–99)
POTASSIUM: 4.6 mmol/L (ref 3.5–5.3)
SODIUM: 139 mmol/L (ref 135–146)
Total Bilirubin: 0.5 mg/dL (ref 0.2–1.2)
Total Protein: 6.7 g/dL (ref 6.1–8.1)

## 2016-10-01 LAB — CBC WITH DIFFERENTIAL/PLATELET
Basophils Absolute: 77 cells/uL (ref 0–200)
Basophils Relative: 1 %
EOS PCT: 2 %
Eosinophils Absolute: 154 cells/uL (ref 15–500)
HCT: 42.1 % (ref 35.0–45.0)
HEMOGLOBIN: 13.7 g/dL (ref 11.7–15.5)
LYMPHS ABS: 2849 {cells}/uL (ref 850–3900)
Lymphocytes Relative: 37 %
MCH: 28.1 pg (ref 27.0–33.0)
MCHC: 32.5 g/dL (ref 32.0–36.0)
MCV: 86.3 fL (ref 80.0–100.0)
MPV: 10.3 fL (ref 7.5–12.5)
Monocytes Absolute: 539 cells/uL (ref 200–950)
Monocytes Relative: 7 %
NEUTROS PCT: 53 %
Neutro Abs: 4081 cells/uL (ref 1500–7800)
Platelets: 258 10*3/uL (ref 140–400)
RBC: 4.88 MIL/uL (ref 3.80–5.10)
RDW: 14.3 % (ref 11.0–15.0)
WBC: 7.7 10*3/uL (ref 3.8–10.8)

## 2016-10-01 NOTE — Telephone Encounter (Signed)
Patient saw Dr. Chase Caller yesterday and they want to do a chest scan and want patient to stop taking fosamax. Patient is concerned about stopping the fosamax because she is scheduled for a bone density scan in two months. Patient would like for Seth Bake to review chart and notes from yesterday and please call her to better explain the plan.

## 2016-10-01 NOTE — Telephone Encounter (Signed)
Patient states Dr. Estanislado Pandy referred her to Dr. Chase Caller.

## 2016-10-01 NOTE — Telephone Encounter (Signed)
Advised patient that looking at her chart, as far as I can tell the reason Dr. Chase Caller wants her to stop the Fosamax due to Acid reflux that can be contributing to her cough. Patient is schedule for a bone density scan in 2 months and wants to know how this will be affected. Patient was also advised by Dr. Chase Caller to follow up with PCP for acid reflux.

## 2016-10-02 ENCOUNTER — Ambulatory Visit: Payer: BC Managed Care – PPO | Admitting: Psychology

## 2016-10-04 LAB — QUANTIFERON TB GOLD ASSAY (BLOOD)
INTERFERON GAMMA RELEASE ASSAY: NEGATIVE
Mitogen-Nil: 9.26 IU/mL
QUANTIFERON NIL VALUE: 0.09 [IU]/mL
Quantiferon Tb Ag Minus Nil Value: <0 IU/mL

## 2016-10-06 NOTE — Progress Notes (Signed)
wnl

## 2016-10-08 ENCOUNTER — Ambulatory Visit
Admission: RE | Admit: 2016-10-08 | Discharge: 2016-10-08 | Disposition: A | Discharge status: Home / Self Care | Payer: BC Managed Care – PPO | Source: Ambulatory Visit | Attending: Internal Medicine | Admitting: Internal Medicine

## 2016-10-08 ENCOUNTER — Ambulatory Visit (INDEPENDENT_AMBULATORY_CARE_PROVIDER_SITE_OTHER): Payer: BC Managed Care – PPO | Admitting: General Practice

## 2016-10-08 DIAGNOSIS — R05 Cough: Secondary | ICD-10-CM

## 2016-10-08 DIAGNOSIS — R768 Other specified abnormal immunological findings in serum: Secondary | ICD-10-CM

## 2016-10-08 DIAGNOSIS — R053 Chronic cough: Secondary | ICD-10-CM

## 2016-10-08 DIAGNOSIS — R760 Raised antibody titer: Secondary | ICD-10-CM

## 2016-10-08 DIAGNOSIS — Z5181 Encounter for therapeutic drug level monitoring: Secondary | ICD-10-CM

## 2016-10-08 LAB — POCT INR: INR: 1.7

## 2016-10-08 NOTE — Patient Instructions (Signed)
Pre visit review using our clinic review tool, if applicable. No additional management support is needed unless otherwise documented below in the visit note. 

## 2016-10-10 ENCOUNTER — Telehealth: Payer: Self-pay | Admitting: Internal Medicine

## 2016-10-10 NOTE — Telephone Encounter (Signed)
Spoke with patient about results. Patient verbalized understanding. Nothing further was needed at time of call.

## 2016-10-10 NOTE — Telephone Encounter (Signed)
Let her now  1. Blood work - quantiferon gold negative  2. CT chest  1. No cancer 2. No copd 3. No fibrosis 4. No pneumonia 5. lugn nodules stable but for 1 that is in RUL has grownf rom 16mm to 32mm - this is benign growth. Recommend CT chest wo contrast in 1 year   3 . Keep up with routein fu   Dr. Brand Males, M.D., [facility].C.P Pulmonary and Critical Care Medicine Staff Physician [place] [facility] Pager: [phone], If no answer or between  15:00h - 7:00h: call [phone]  10/10/2016 2:07 PM       TECHNIQUE: Multidetector CT imaging of the chest was performed following the standard protocol without intravenous contrast. High resolution imaging of the lungs, as well as inspiratory and expiratory imaging, was performed.  COMPARISON: Chest CT 12/25/2006.  FINDINGS: Cardiovascular: Heart size is normal. There is no significant pericardial fluid, thickening or pericardial calcification. No significant atherosclerotic calcifications are noted in the thoracic aorta or in the coronary arteries.  Mediastinum/Nodes: No pathologically enlarged mediastinal or hilar lymph nodes. Please note that accurate exclusion of hilar adenopathy is limited on noncontrast CT scans. Esophagus is unremarkable in appearance. No axillary lymphadenopathy. New 10 x 12 mm low-attenuation lesion in the right lobe of the thyroid gland (axial image 11 of series 2).  Lungs/Pleura: Nodular pleural-based area of architectural distortion in the posterior aspect of the left lower lobe (axial image 108 of series 7) measuring 8 x 12 mm (mean diameter of 10 mm) very similar to remote prior study 12/25/2006, presumably a nodular area of scarring. Previously noted 2 mm nodule in the right upper lobe (image 51 of series 7) is unchanged. Previously noted 4 mm right upper lobe nodule is slightly larger measuring only 6 mm on today's study (axial image 56 of  series 7), presumably benign. The 4 mm nodule in the lateral segment of the right middle lobe is unchanged (axial image 79 of series 7). Tiny calcified granuloma in the right lower lobe. No acute consolidative airspace disease. No pleural effusions. High-resolution images demonstrate no significant regions of ground-glass attenuation, subpleural reticulation, parenchymal banding, traction bronchiectasis or frank honeycombing. Inspiratory and expiratory imaging demonstrates mild to moderate air trapping, indicative of small airways disease.  Upper Abdomen: Unremarkable.  Musculoskeletal: There are no aggressive appearing lytic or blastic lesions noted in the visualized portions of the skeleton. Old healed fracture of the manubrium.  IMPRESSION: 1. No evidence of interstitial lung disease. 2. Multiple small pulmonary nodules scattered throughout the lungs bilaterally, generally similar in size to the prior study. The largest of these nodular areas is in the posterior aspect of the left lower lobe, presumably an area of chronic scarring. One nodule demonstrates slight growth from 4 mm to 6 mm over a 10 year time span, presumably benign. No suspicious pulmonary nodules or masses are noted. 3. Additional incidental findings, as above.   Electronically Signed By: Vinnie Langton M.D. On: 10/08/2016 14:22

## 2016-10-10 NOTE — Telephone Encounter (Signed)
Called and spoke with pt and she is requesting to get the results of her CT scan.  Pt was very concerned about these results and wanted to be able to enjoy her weekend. MR please advise. Thanks   Allergies  Allergen Reactions  . Boniva [Ibandronate Sodium]     Heartburn  . Ivp Dye [Iodinated Diagnostic Agents] Swelling    Mouth swelling.  . Risedronate Sodium [Risedronate Sodium] Other (See Comments)    Foot and bone pain and fatigue

## 2016-10-14 ENCOUNTER — Ambulatory Visit (INDEPENDENT_AMBULATORY_CARE_PROVIDER_SITE_OTHER): Payer: BC Managed Care – PPO | Admitting: Internal Medicine

## 2016-10-14 ENCOUNTER — Telehealth: Payer: Self-pay | Admitting: Internal Medicine

## 2016-10-14 DIAGNOSIS — R05 Cough: Secondary | ICD-10-CM | POA: Diagnosis not present

## 2016-10-14 DIAGNOSIS — R053 Chronic cough: Secondary | ICD-10-CM

## 2016-10-14 LAB — PULMONARY FUNCTION TEST
DL/VA % pred: 104 %
DL/VA: 4.79 ml/min/mmHg/L
DLCO COR % PRED: 80 %
DLCO COR: 17.8 ml/min/mmHg
DLCO UNC: 17.74 ml/min/mmHg
DLCO unc % pred: 79 %
FEF 25-75 Post: 3.99 L/sec
FEF 25-75 Pre: 1.47 L/sec
FEF2575-%Change-Post: 171 %
FEF2575-%Pred-Post: 170 %
FEF2575-%Pred-Pre: 62 %
FEV1-%CHANGE-POST: 18 %
FEV1-%PRED-POST: 94 %
FEV1-%PRED-PRE: 79 %
FEV1-POST: 2.33 L
FEV1-Pre: 1.96 L
FEV1FVC-%CHANGE-POST: 22 %
FEV1FVC-%Pred-Pre: 95 %
FEV6-%Change-Post: -3 %
FEV6-%PRED-PRE: 86 %
FEV6-%Pred-Post: 83 %
FEV6-PRE: 2.64 L
FEV6-Post: 2.56 L
FEV6FVC-%PRED-PRE: 103 %
FEV6FVC-%Pred-Post: 103 %
FVC-%Change-Post: -3 %
FVC-%PRED-PRE: 83 %
FVC-%Pred-Post: 80 %
FVC-POST: 2.56 L
FVC-PRE: 2.64 L
POST FEV6/FVC RATIO: 100 %
Post FEV1/FVC ratio: 91 %
Pre FEV1/FVC ratio: 74 %
Pre FEV6/FVC Ratio: 100 %

## 2016-10-14 NOTE — Progress Notes (Signed)
PFT done today. 

## 2016-10-14 NOTE — Telephone Encounter (Signed)
lmtcb X1 for pt  

## 2016-10-15 NOTE — Telephone Encounter (Signed)
Spoke with the pt  She has several questions  1- how many nodules exactly does she have? 2- do you think that it's ok to take PPI with her blood thinner? 3- how long do you want her to take prilosec? 4- what are her PFT results?  Note that she has ov 12/11/16  She refused ov with NP sooner

## 2016-10-16 ENCOUNTER — Ambulatory Visit (INDEPENDENT_AMBULATORY_CARE_PROVIDER_SITE_OTHER): Payer: BC Managed Care – PPO | Admitting: Psychology

## 2016-10-16 ENCOUNTER — Telehealth: Payer: Self-pay | Admitting: Rheumatology

## 2016-10-16 DIAGNOSIS — F411 Generalized anxiety disorder: Secondary | ICD-10-CM

## 2016-10-16 NOTE — Telephone Encounter (Signed)
Patient went to pulmonary doctor due to cough. After several tests doctor felt like cough is due to Acid reflux due to Fosamax. Patient has started  Prilosec two days ago, and stop Fosamax two weeks ago. Patient scheduled for bone density in July. Does patient need to use another medicine, or start Fosamax again until bone density? Please call to advise patient.

## 2016-10-16 NOTE — Telephone Encounter (Signed)
:  1. Number of nodules - really does not matter but the radiiologist   Has described 5 nodules  1) Nodular pleural-based area of architectural distortion in the posterior aspect of the left lower lobe (axial image 108 of series 7) measuring 8 x 12 mm (mean diameter of 10 mm) very similar to remote prior study 12/25/2006, presumably a nodular area of scarring.   2) Previously noted 2 mm nodule in the right upper lobe (image 51 of series 7) is unchanged.   3) Previously noted 4 mm right upper lobe nodule is slightly larger measuring only 6 mm on today's study (axial image 56 of series 7), presumably benign.   4) The 4 mm nodule in the lateral segment of the right middle lobe is unchanged (axial image 79 of series 7).  5) Tiny calcified granuloma in the right lower lobe.   Prilose and coumadin - not that I know of but she can also check with coumadin clinic  PFT test  - normal   Dr. Brand Males, M.D., [facility].C.P Pulmonary and Critical Care Medicine Staff Physician [place] [facility] Pager: [phone], If no answer or between  15:00h - 7:00h: call [phone]  10/16/2016 4:03 PM       Current Outpatient Prescriptions on File Prior to Visit  Medication Sig Dispense Refill  . Calcium Carb-Cholecalciferol (CALTRATE 600+D) 600-800 MG-UNIT TABS Take by mouth.    . Cholecalciferol (VITAMIN D3) 2000 UNITS TABS Take 1 tablet by mouth daily.    Marland Kitchen COUMADIN 2.5 MG tablet TAKE AS DIRECTED BY  COUMADIN  CLINIC 180 tablet 1  . cycloSPORINE (RESTASIS) 0.05 % ophthalmic emulsion Place 1 drop into both eyes daily.      . folic acid (FOLVITE) 1 MG tablet Take 2 tablets (2 mg total) by mouth daily. 180 tablet 3  . FOSAMAX 70 MG tablet TAKE ONE TABLET BY MOUTH ONCE A WEEK 12 tablet 1  . hydroxychloroquine (PLAQUENIL) 200 MG tablet TAKE 1 TABLET BY MOUTH EVERY MORNING AND 1/2 TABLET EVERY EVENING (Patient taking differently: take daily) 135 tablet 0  .  methotrexate (RHEUMATREX) 2.5 MG tablet TAKE EIGHT TABLETS BY MOUTH ONCE A WEEK 96 tablet 0  . mineral/vitamin supplement (MULTIGEN) 70 MG TABS tablet TAKE ONE TABLET BY MOUTH ONCE DAILY 30 tablet 5  . NASCOBAL 500 MCG/0.1ML SOLN Place 0.01 mLs (50 mcg total) into the nose once a week. 12 Bottle 0  . SUMAtriptan (IMITREX) 50 MG tablet TAKE ONE TABLET BY MOUTH AS NEEDED. MAY REPEAT IN 2 TO 4 HOURS IF NEEDED. 9 tablet 5  . urea (CARMOL) 40 % CREA APPLY  CREAM TOPICALLY EVERY DAY 29 each 1   No current facility-administered medications on file prior to visit.

## 2016-10-16 NOTE — Telephone Encounter (Signed)
Spoke with pt, aware of recs.  Nothing further needed at this time.  

## 2016-10-17 NOTE — Telephone Encounter (Signed)
Please advise 

## 2016-10-17 NOTE — Telephone Encounter (Signed)
We can wait till bone density and a start her on Reclast after that if needed

## 2016-10-20 NOTE — Telephone Encounter (Signed)
Patient advised that she should stay off of the Fosamax and have her bone density scan as scheduled.Patient advised if needed we can discuss starting Reclast after the scan. Patient verbalized understanding.

## 2016-11-05 ENCOUNTER — Ambulatory Visit (INDEPENDENT_AMBULATORY_CARE_PROVIDER_SITE_OTHER): Payer: BC Managed Care – PPO | Admitting: General Practice

## 2016-11-05 DIAGNOSIS — Z5181 Encounter for therapeutic drug level monitoring: Secondary | ICD-10-CM

## 2016-11-05 DIAGNOSIS — R760 Raised antibody titer: Secondary | ICD-10-CM

## 2016-11-05 DIAGNOSIS — R768 Other specified abnormal immunological findings in serum: Secondary | ICD-10-CM

## 2016-11-05 LAB — POCT INR: INR: 2.1

## 2016-11-05 NOTE — Patient Instructions (Signed)
Pre visit review using our clinic review tool, if applicable. No additional management support is needed unless otherwise documented below in the visit note. 

## 2016-11-13 ENCOUNTER — Ambulatory Visit (INDEPENDENT_AMBULATORY_CARE_PROVIDER_SITE_OTHER): Payer: BC Managed Care – PPO | Admitting: Psychology

## 2016-11-13 DIAGNOSIS — F411 Generalized anxiety disorder: Secondary | ICD-10-CM

## 2016-11-20 ENCOUNTER — Other Ambulatory Visit: Payer: Self-pay | Admitting: Rheumatology

## 2016-11-20 NOTE — Telephone Encounter (Signed)
Last Visit: 09/03/16 Next Visit: 12/24/16 Labs: 10/01/16 WNL  Okay to refill per Dr. Estanislado Pandy

## 2016-11-27 ENCOUNTER — Ambulatory Visit (INDEPENDENT_AMBULATORY_CARE_PROVIDER_SITE_OTHER): Payer: BC Managed Care – PPO | Admitting: Psychology

## 2016-11-27 DIAGNOSIS — F411 Generalized anxiety disorder: Secondary | ICD-10-CM | POA: Diagnosis not present

## 2016-12-01 ENCOUNTER — Encounter: Payer: Self-pay | Admitting: Oncology

## 2016-12-03 ENCOUNTER — Ambulatory Visit: Payer: Self-pay

## 2016-12-10 ENCOUNTER — Ambulatory Visit (INDEPENDENT_AMBULATORY_CARE_PROVIDER_SITE_OTHER): Payer: BC Managed Care – PPO | Admitting: General Practice

## 2016-12-10 DIAGNOSIS — R760 Raised antibody titer: Secondary | ICD-10-CM

## 2016-12-10 DIAGNOSIS — Z5181 Encounter for therapeutic drug level monitoring: Secondary | ICD-10-CM

## 2016-12-10 DIAGNOSIS — R768 Other specified abnormal immunological findings in serum: Secondary | ICD-10-CM

## 2016-12-10 LAB — POCT INR: INR: 2.2

## 2016-12-10 NOTE — Patient Instructions (Signed)
Pre visit review using our clinic review tool, if applicable. No additional management support is needed unless otherwise documented below in the visit note. 

## 2016-12-11 ENCOUNTER — Ambulatory Visit: Payer: Self-pay | Admitting: Internal Medicine

## 2016-12-11 ENCOUNTER — Ambulatory Visit (INDEPENDENT_AMBULATORY_CARE_PROVIDER_SITE_OTHER): Payer: BC Managed Care – PPO | Admitting: Psychology

## 2016-12-11 DIAGNOSIS — F411 Generalized anxiety disorder: Secondary | ICD-10-CM | POA: Diagnosis not present

## 2016-12-17 ENCOUNTER — Ambulatory Visit: Payer: Self-pay

## 2016-12-18 ENCOUNTER — Ambulatory Visit (INDEPENDENT_AMBULATORY_CARE_PROVIDER_SITE_OTHER): Payer: BC Managed Care – PPO | Admitting: Psychology

## 2016-12-18 DIAGNOSIS — F411 Generalized anxiety disorder: Secondary | ICD-10-CM

## 2016-12-22 DIAGNOSIS — H40053 Ocular hypertension, bilateral: Secondary | ICD-10-CM | POA: Insufficient documentation

## 2016-12-22 DIAGNOSIS — R768 Other specified abnormal immunological findings in serum: Secondary | ICD-10-CM | POA: Insufficient documentation

## 2016-12-22 DIAGNOSIS — M3500 Sicca syndrome, unspecified: Secondary | ICD-10-CM | POA: Insufficient documentation

## 2016-12-22 NOTE — Progress Notes (Signed)
Office Visit Note  Patient: [name]             Date of Birth: 15-Sep-1960           MRN: [id number]             PCP: Burnis Medin, MD Referring: Burnis Medin, MD Visit Date: 12/24/2016 Occupation: @GUAROCC @    Subjective:  pain hands.   History of Present Illness: [name] is a 56 y.o. female with history of sero positive arthritis. She states she's been doing quite well as regards to rheumatoid arthritis. She has occasional flares which only happens with increased activity and the resolve by themselves. She's been off prednisone for 7 months now. She still have some sicca symptoms. She states her intraocular pressure is better now. She had recent bone density which did not show much change in her bone mass. She came off Fosamax in May secondary to increased problems with reflux. She has reduced Plaquenil to one a day for the last 4 months.   Activities of Daily Living:  Patient reports morning stiffness for 0 minute.   Patient Denies nocturnal pain.  Difficulty dressing/grooming: Denies Difficulty climbing stairs: Denies Difficulty getting out of chair: Denies Difficulty using hands for taps, buttons, cutlery, and/or writing: Denies   Review of Systems  Constitutional: Positive for fatigue. Negative for night sweats, weight gain, weight loss and weakness.  HENT: Positive for mouth dryness. Negative for mouth sores, trouble swallowing, trouble swallowing and nose dryness.   Eyes: Positive for dryness. Negative for pain, redness and visual disturbance.  Respiratory: Negative for cough, shortness of breath and difficulty breathing.   Cardiovascular: Negative for chest pain, palpitations, hypertension, irregular heartbeat and swelling in legs/feet.  Gastrointestinal: Negative for blood in stool, constipation and diarrhea.  Endocrine: Negative for increased urination.  Genitourinary: Negative for vaginal dryness.  Musculoskeletal: Positive for arthralgias  and joint pain. Negative for joint swelling, myalgias, muscle weakness, morning stiffness, muscle tenderness and myalgias.  Skin: Negative for color change, rash, hair loss, skin tightness, ulcers and sensitivity to sunlight.  Allergic/Immunologic: Negative for susceptible to infections.  Neurological: Negative for dizziness, memory loss and night sweats.  Hematological: Negative for swollen glands.  Psychiatric/Behavioral: Negative for depressed mood and sleep disturbance. The patient is not nervous/anxious.     PMFS History:  Patient Active Problem List   Diagnosis Date Noted  . Sicca syndrome ([place]) 12/22/2016  . ANA positive 12/22/2016  . 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 Migraine 03/31/2016  . Anxiety 03/31/2016  . High risk medication use 03/31/2016  . Primary osteoarthritis of both feet 03/31/2016  . Primary osteoarthritis of both knees 03/31/2016  . Stiffness of left hand joint 11/27/2014  . Elevated IOP 06/09/2014  . Long term current use of systemic steroids 06/09/2014  . Medically complex patient 06/09/2014  . Current use of steroid medication 01/09/2014  . Xerosis of skin 12/07/2013  . Encounter for therapeutic drug monitoring 06/09/2013  . B12 deficiency 05/31/2013  . Iron deficiency 05/31/2013  . Visit for preventive health examination 05/25/2012  . Mammogram abnormal 04/12/2012  . 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 Vaginal atrophy 01/23/2012  . History of recurrent UTI (urinary tract infection) 01/23/2012  . Osteoporosis 04/29/2011  . Nonspecific abnormal results of liver function study 12/08/2010  . Chronic anticoagulation   . Anticoagulant long-term use 07/15/2010  . 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 Chronic cough 03/19/2009  . CHEST PAIN-UNSPECIFIED 08/26/2008  .  PALPITATIONS, RECURRENT 07/24/2008  . Vitamin D deficiency 05/30/2008  . HYPERLIPIDEMIA 05/30/2008  . IRON DEFICIENCY 05/30/2008  . Adjustment disorder with anxiety  05/30/2008  . Rheumatoid arthritis ([place]) 05/30/2008  . PULMONARY EMBOLISM, HX OF 05/30/2008    Past Medical History:  Diagnosis Date  . Adjustment disorder with anxiety   . B12 deficiency due to diet    is a vegetarian  . Chest pain, unspecified   . Chronic anticoagulation   . Headache(784.0)    migraines and head pain  . Hx of varicella    As Child  . Iron deficiency anemia, unspecified   . Otalgia, unspecified   . Other and unspecified hyperlipidemia   . Palpitations   . Personal history of venous thrombosis and embolism   . Recurrent UTI   . Rheumatoid arthritis(714.0)    deveshewar  . Unspecified vitamin D deficiency   . UTI (lower urinary tract infection) 11/28/2011   citrobacter       Family History  Problem Relation Age of Onset  . Dementia Father   . Stroke Father   . Hyperlipidemia Father   . Diabetes Father   . Parkinsonism Mother   . Lymphoma Unknown    Past Surgical History:  Procedure Laterality Date  . ORIF ULNAR FRACTURE    . TYMPANOSTOMY TUBE PLACEMENT  2009   Right   Social History   Social History Narrative   Regular Exercise-no   20 yrs in [place]   Son at [employer]   From [place].   Married HH  of 3    G1P1              Objective: Vital Signs: BP 111/67 (BP Location: Left Arm, Patient Position: Sitting, Cuff Size: Normal)   Pulse (!) 58   Ht 5\' 4"  (1.626 m)   Wt 164 lb (74.4 kg)   BMI 28.15 kg/m    Physical Exam  Constitutional: She is oriented to person, place, and time. She appears well-developed and well-nourished.  HENT:  Head: Normocephalic and atraumatic.  Eyes: Conjunctivae and EOM are normal.  Neck: Normal range of motion.  Cardiovascular: Normal rate, regular rhythm, normal heart sounds and intact distal pulses.   Pulmonary/Chest: Effort normal and breath sounds normal.  Abdominal: Soft. Bowel sounds are normal.  Lymphadenopathy:    She has no cervical adenopathy.  Neurological: She is alert and oriented to person, place,  and time.  Skin: Skin is warm and dry. Capillary refill takes less than 2 seconds.  Psychiatric: She has a normal mood and affect. Her behavior is normal.  Nursing note and vitals reviewed.    Musculoskeletal Exam: C-spine and thoracic lumbar spine good range of motion. She has some thoracic kyphosis. Shoulder joints elbow joints wrist joint MCPs PIPs DIPs with good range of motion with no synovitis. Hip joints knee joints ankles MTPs PIPs DIPs with good range of motion with no synovitis.   CDAI Exam: CDAI Homunculus Exam:   Joint Counts:  CDAI Tender Joint count: 0 CDAI Swollen Joint count: 0  Global Assessments:  Patient Global Assessment: 1 Provider Global Assessment: 1  CDAI Calculated Score: 2    Investigation: Findings:  11/28/2014   Her bone density showed a T-score of minus 4.2 in the radius which is pretty much stable from her previous one in 2014 with minus 4.5.  There was no percent decrease noted on the bone density.  Her femoral region, left femur there was a decrease of minus 4.4% which  was not significant.  12/03/2016 no significant change in bone density at Spine, Right hip, and left hip lowest measured site -2.5 left femoral neck. (previously this was -2.5, 2014 was -2.8)   CBC Latest Ref Rng & Units 10/01/2016 07/03/2016 03/26/2016  WBC 3.8 - 10.8 K/uL 7.7 7.6 7.7  Hemoglobin 11.7 - 15.5 g/dL 13.7 13.7 14.0  Hematocrit 35.0 - 45.0 % 42.1 42.0 42.6  Platelets 140 - 400 K/uL 258 263.0 274   CMP     Component Value Date/Time   NA 139 10/01/2016 0943   K 4.6 10/01/2016 0943   CL 106 10/01/2016 0943   CO2 26 10/01/2016 0943   GLUCOSE 92 10/01/2016 0943   BUN 7 10/01/2016 0943   CREATININE 0.80 10/01/2016 0943   CALCIUM 9.1 10/01/2016 0943   PROT 6.7 10/01/2016 0943   ALBUMIN 3.8 10/01/2016 0943   AST 18 10/01/2016 0943   ALT 15 10/01/2016 0943   ALKPHOS 80 10/01/2016 0943   BILITOT 0.5 10/01/2016 0943   GFRNONAA 82 10/01/2016 0943   GFRAA >89 10/01/2016  0943     Imaging: No results found.  Speciality Comments: No specialty comments available.    Procedures:  No procedures performed Allergies: Boniva [ibandronate sodium]; Ivp dye [iodinated diagnostic agents]; and Risedronate sodium [risedronate sodium]   Assessment / Plan:     Visit Diagnoses: Rheumatoid arthritis of multiple sites with negative rheumatoid factor (HCC) - RF negative, anti-CCP positive, ANA positive. Patient is clinically doing well and has no synovitis on examination today.  High risk medication use - Plaquenil 200 mg by mouth daily and Methotrexate 8 tablets by mouth every week, folic acid 2 mg by mouth daily. Her labs at been stable we'll check labs next week and then every 3 months to monitor for drug toxicity.  Long term use of systemic steroids: She's been off prednisone for 7 months now and had no flares. She has intermittent joint discomfort which gets better by itself.  Sicca syndrome [facility]): Over-the-counter products were discussed.  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  Primary osteoarthritis of both knees: Joint protection and muscle strengthening weight loss discussed.  Primary osteoarthritis of both feet: Proper fitting shoes were discussed.  Age related osteoporosis, unspecified pathological fracture presence - on Fosamax 70 mg po q week (discontinued due to reflux in May 2018), DEXA July 2018 left finger BMD 0.736 with the change of -0.3% T score of -2.5. At this point she does not want to start any medication. She has taken Fosamax for 4 years and wants to take time off from Fosamax. I do not have a comparison of BMD in her wrist where her T score was worse. Patient will try if she can get her bone density of her wrist. It the BMD is worse in her wrists and we may consider starting her on Reclast.  Vitamin D deficiency - Plan: VITAMIN D 25 Hydroxy (Vit-D Deficiency, Fractures) with her next blood draw.  History of pulmonary  embolism/ anticoagulated   History of migraine  History of anemia  History of hyperlipidemia  Raised intraocular pressure of both eyes : According to patient her intraocular pressure is improved.   Orders: Orders Placed This Encounter  Procedures  . VITAMIN D 25 Hydroxy (Vit-D Deficiency, Fractures)   No orders of the defined types were placed in this encounter.   Face-to-face time spent with patient was 30 minutes. Greater than 50% of time was spent in counseling and coordination of care.  Follow-Up Instructions: Return in about 5 months (around 05/26/2017) for Rheumatoid arthritis, Osteoarthritis.   Bo Merino, MD  Note - This record has been created using Editor, commissioning.  Chart creation errors have been sought, but may not always  have been located. Such creation errors do not reflect on  the standard of medical care.

## 2016-12-24 ENCOUNTER — Other Ambulatory Visit: Payer: Self-pay | Admitting: Rheumatology

## 2016-12-24 ENCOUNTER — Encounter: Payer: Self-pay | Admitting: Rheumatology

## 2016-12-24 ENCOUNTER — Ambulatory Visit (INDEPENDENT_AMBULATORY_CARE_PROVIDER_SITE_OTHER): Payer: BC Managed Care – PPO | Admitting: Rheumatology

## 2016-12-24 VITALS — BP 111/67 | HR 58 | Ht 64.0 in | Wt 164.0 lb

## 2016-12-24 DIAGNOSIS — Z86711 Personal history of pulmonary embolism: Secondary | ICD-10-CM | POA: Diagnosis not present

## 2016-12-24 DIAGNOSIS — Z8669 Personal history of other diseases of the nervous system and sense organs: Secondary | ICD-10-CM | POA: Diagnosis not present

## 2016-12-24 DIAGNOSIS — M810 Age-related osteoporosis without current pathological fracture: Secondary | ICD-10-CM | POA: Diagnosis not present

## 2016-12-24 DIAGNOSIS — M19072 Primary osteoarthritis, left ankle and foot: Secondary | ICD-10-CM

## 2016-12-24 DIAGNOSIS — Z7952 Long term (current) use of systemic steroids: Secondary | ICD-10-CM

## 2016-12-24 DIAGNOSIS — R760 Raised antibody titer: Secondary | ICD-10-CM

## 2016-12-24 DIAGNOSIS — R768 Other specified abnormal immunological findings in serum: Secondary | ICD-10-CM | POA: Diagnosis not present

## 2016-12-24 DIAGNOSIS — M3500 Sicca syndrome, unspecified: Secondary | ICD-10-CM | POA: Diagnosis not present

## 2016-12-24 DIAGNOSIS — M0609 Rheumatoid arthritis without rheumatoid factor, multiple sites: Secondary | ICD-10-CM | POA: Diagnosis not present

## 2016-12-24 DIAGNOSIS — M170 Bilateral primary osteoarthritis of knee: Secondary | ICD-10-CM | POA: Diagnosis not present

## 2016-12-24 DIAGNOSIS — Z8639 Personal history of other endocrine, nutritional and metabolic disease: Secondary | ICD-10-CM

## 2016-12-24 DIAGNOSIS — H40053 Ocular hypertension, bilateral: Secondary | ICD-10-CM

## 2016-12-24 DIAGNOSIS — Z79899 Other long term (current) drug therapy: Secondary | ICD-10-CM | POA: Diagnosis not present

## 2016-12-24 DIAGNOSIS — E559 Vitamin D deficiency, unspecified: Secondary | ICD-10-CM

## 2016-12-24 DIAGNOSIS — Z862 Personal history of diseases of the blood and blood-forming organs and certain disorders involving the immune mechanism: Secondary | ICD-10-CM

## 2016-12-24 DIAGNOSIS — M19071 Primary osteoarthritis, right ankle and foot: Secondary | ICD-10-CM

## 2016-12-24 NOTE — Patient Instructions (Addendum)
Standing Labs We placed an order today for your standing lab work.    Please come back and get your standing labs in August and every 3 months. Vitamin D with next lab.  We have open lab Monday through Friday from 8:30-11:30 AM and 1:30-4 PM at the office of Dr. Bo Merino.   The office is located at [address], [place], [place], [place] [address] No appointment is necessary.   Labs are drawn by [facility].  You may receive a bill from [facility] for your lab work. If you have any questions regarding directions or hours of operation,  please call [phone].

## 2016-12-24 NOTE — Telephone Encounter (Signed)
Patient forgot to ask for a refill at her appt today. Patient is requesting refill of PLQ to be sent to [facility] on [address] (3 month supply).

## 2016-12-25 ENCOUNTER — Ambulatory Visit (INDEPENDENT_AMBULATORY_CARE_PROVIDER_SITE_OTHER): Payer: BC Managed Care – PPO | Admitting: Psychology

## 2016-12-25 DIAGNOSIS — F411 Generalized anxiety disorder: Secondary | ICD-10-CM

## 2016-12-25 MED ORDER — HYDROXYCHLOROQUINE SULFATE 200 MG PO TABS: 200.0000 mg | ORAL_TABLET | Freq: Every day | ORAL | 0 refills | Status: DC

## 2016-12-25 NOTE — Telephone Encounter (Signed)
Last Visit: 12/23/16 Next Visit: 06/01/17 Labs: 10/01/16 WNL  PLQ Eye Exam: 06/2016 WNL  Okay to refill per Dr. Estanislado Pandy

## 2017-01-01 ENCOUNTER — Encounter: Payer: Self-pay | Admitting: Internal Medicine

## 2017-01-02 ENCOUNTER — Telehealth: Payer: Self-pay | Admitting: Radiology

## 2017-01-02 NOTE — Telephone Encounter (Signed)
Error

## 2017-01-06 NOTE — Patient Instructions (Signed)
Pre visit review using our clinic review tool, if applicable. No additional management support is needed unless otherwise documented below in the visit note. 

## 2017-01-07 ENCOUNTER — Ambulatory Visit (INDEPENDENT_AMBULATORY_CARE_PROVIDER_SITE_OTHER): Payer: BC Managed Care – PPO | Admitting: General Practice

## 2017-01-07 DIAGNOSIS — Z5181 Encounter for therapeutic drug level monitoring: Secondary | ICD-10-CM

## 2017-01-07 DIAGNOSIS — R768 Other specified abnormal immunological findings in serum: Secondary | ICD-10-CM

## 2017-01-08 ENCOUNTER — Ambulatory Visit (INDEPENDENT_AMBULATORY_CARE_PROVIDER_SITE_OTHER): Payer: BC Managed Care – PPO | Admitting: Psychology

## 2017-01-08 DIAGNOSIS — F411 Generalized anxiety disorder: Secondary | ICD-10-CM

## 2017-01-09 ENCOUNTER — Other Ambulatory Visit: Payer: Self-pay | Admitting: *Deleted

## 2017-01-09 DIAGNOSIS — Z79899 Other long term (current) drug therapy: Secondary | ICD-10-CM

## 2017-01-09 DIAGNOSIS — E559 Vitamin D deficiency, unspecified: Secondary | ICD-10-CM

## 2017-01-09 LAB — CBC WITH DIFFERENTIAL/PLATELET
Basophils Absolute: 87 cells/uL (ref 0–200)
Basophils Relative: 1 %
EOS PCT: 2 %
Eosinophils Absolute: 174 cells/uL (ref 15–500)
HCT: 43.6 % (ref 35.0–45.0)
Hemoglobin: 14.5 g/dL (ref 11.7–15.5)
LYMPHS ABS: 2784 {cells}/uL (ref 850–3900)
LYMPHS PCT: 32 %
MCH: 28.4 pg (ref 27.0–33.0)
MCHC: 33.3 g/dL (ref 32.0–36.0)
MCV: 85.5 fL (ref 80.0–100.0)
MPV: 10.9 fL (ref 7.5–12.5)
Monocytes Absolute: 696 cells/uL (ref 200–950)
Monocytes Relative: 8 %
NEUTROS PCT: 57 %
Neutro Abs: 4959 cells/uL (ref 1500–7800)
Platelets: 283 10*3/uL (ref 140–400)
RBC: 5.1 MIL/uL (ref 3.80–5.10)
RDW: 13.7 % (ref 11.0–15.0)
WBC: 8.7 10*3/uL (ref 3.8–10.8)

## 2017-01-09 NOTE — Patient Instructions (Signed)
Pre visit review using our clinic review tool, if applicable. No additional management support is needed unless otherwise documented below in the visit note. 

## 2017-01-10 LAB — COMPLETE METABOLIC PANEL WITH GFR
ALT: 29 U/L (ref 6–29)
AST: 27 U/L (ref 10–35)
Albumin: 4.1 g/dL (ref 3.6–5.1)
Alkaline Phosphatase: 94 U/L (ref 33–130)
BUN: 10 mg/dL (ref 7–25)
CHLORIDE: 105 mmol/L (ref 98–110)
CO2: 22 mmol/L (ref 20–32)
Calcium: 9.2 mg/dL (ref 8.6–10.4)
Creat: 0.77 mg/dL (ref 0.50–1.05)
GFR, Est African American: >89 mL/min (ref >=60)
GFR, Est Non African American: 85 mL/min (ref >=60)
GLUCOSE: 94 mg/dL (ref 65–99)
Potassium: 4.4 mmol/L (ref 3.5–5.3)
SODIUM: 139 mmol/L (ref 135–146)
Total Bilirubin: 0.5 mg/dL (ref 0.2–1.2)
Total Protein: 7 g/dL (ref 6.1–8.1)

## 2017-01-10 LAB — VITAMIN D 25 HYDROXY (VIT D DEFICIENCY, FRACTURES): VIT D 25 HYDROXY: 31 ng/mL (ref 30–100)

## 2017-01-11 NOTE — Progress Notes (Signed)
WNLS

## 2017-01-12 ENCOUNTER — Ambulatory Visit (INDEPENDENT_AMBULATORY_CARE_PROVIDER_SITE_OTHER): Payer: BC Managed Care – PPO | Admitting: General Practice

## 2017-01-12 DIAGNOSIS — Z5181 Encounter for therapeutic drug level monitoring: Secondary | ICD-10-CM

## 2017-01-12 DIAGNOSIS — R768 Other specified abnormal immunological findings in serum: Secondary | ICD-10-CM

## 2017-01-12 DIAGNOSIS — R760 Raised antibody titer: Secondary | ICD-10-CM

## 2017-01-12 LAB — POCT INR: INR: 1.8

## 2017-01-22 ENCOUNTER — Ambulatory Visit: Payer: BC Managed Care – PPO | Admitting: Psychology

## 2017-01-23 ENCOUNTER — Other Ambulatory Visit: Payer: Self-pay | Admitting: Internal Medicine

## 2017-01-27 ENCOUNTER — Ambulatory Visit (INDEPENDENT_AMBULATORY_CARE_PROVIDER_SITE_OTHER): Payer: BC Managed Care – PPO | Admitting: Internal Medicine

## 2017-01-27 ENCOUNTER — Encounter: Payer: Self-pay | Admitting: Internal Medicine

## 2017-01-27 VITALS — BP 104/78 | HR 62 | Ht 64.0 in | Wt 168.0 lb

## 2017-01-27 DIAGNOSIS — Z86718 Personal history of other venous thrombosis and embolism: Secondary | ICD-10-CM | POA: Diagnosis not present

## 2017-01-27 DIAGNOSIS — R911 Solitary pulmonary nodule: Secondary | ICD-10-CM | POA: Diagnosis not present

## 2017-01-27 DIAGNOSIS — R05 Cough: Secondary | ICD-10-CM

## 2017-01-27 DIAGNOSIS — R053 Chronic cough: Secondary | ICD-10-CM

## 2017-01-27 NOTE — Progress Notes (Signed)
Subjective:     Patient ID: [name], female   DOB: 1960-10-02, 56 y.o.   MRN: [id number]  HPI   PCP Panosh, Standley Brooking, MD   HPI  IOV 09/29/2016  Chief Complaint  Patient presents with  . Pulmonary Consult    Pt referred by Dr. Estanislado Pandy for chronic cough x 1 year. Pt states the cough is dry. Pt denies chest congestion, f/c/s and CP/tightness.     [name] 56 y.o. [name]  [name] female with    # seropositive rheumatoid arthritis follows with Dr Bo Merino. Chart review shows that last visit with her office wasn't April 2018. She reports a diagnosis of rheumatoid arthritis in 2011.  She had been off prednisone for 3 months at least as of April 2018.Marland Kitchen Chart review shows patient's on methotrexate and hydroxychloroquine. She's been on methotrexate at least since 2011 according to history. This is weekly. She has not been on any other stronger immunomodulators  #She also lso history of pulmonary embolism for which she is on Coumadin. There is a diagnosis in [place] in 2006. She showed me the report from [facility] imaging confirming lower lobe bilateral pulmonary emboli along with small lung nodules. although the only CT angiogram chest in our health system was in 12/25/2006 that was negative for pulmonary embolism. She did have elevated d-dimer at that time. She is followed at [facility] since then every few years. Even though she has had only one single episode of pulmonary embolism which she thinks might be idiopathic versus related to per chart anti-cardiolipinb 5. She opted to be on lifelong anticoagulation. She is even been reluctant to switch to low dose elqiuuis although this was offered as an option at [facility] couple of years ago. Her target INR is 1.5. She is open to seeing Dr. Karel Jarvis to see if she should continue on anticoagulation. She self describes herself as extremely anxious and is really worried about  having a repeat pulmonary embolism.   #Current history is one of chronic cough for the last 1 year. It is mild to moderate in intensity. It is present mostly in the daytime particularly in the evenings. However it does not wake her up from her sleep at night. She is extremely worried with a cough in the setting of rheumatoid arthritis and immunosuppression. Last in July 2017 she did take a trip to [place] for 10 days and during this time the cough resolved but upon entering back to the [place] the cough record. It is stable it is not progressive but it is persistent and it is annoying. Talking a lot makes her cough worse. She describes as of this anxious and she is observed in the office to be talking quite a bit. During this time she is coughing and clearing the throat. She does admit to the fact that the cough might be coming from the throat or the upper chest. There is a tickle in the throat the cough is associated with clearing of the throat.  In terms of cough associated specific history -ACE inhibitor use: Medication review does not show this - Acid reflux: She reports acid reflux for 1 year associated with the use of Fosamax which she is on for 4 years. Acid reflux is mild. She is not on any medication for this. - Sinus drainage and allergies: She denies. She's not interested in CT scan of the chest for the sinuses. - Asthma: She denies any prior history of  asthma current wheezing. Exhaled nitric oxide today in the office was 9 ppb and normal  . Chest x-ray 08/02/2015: Personally visualized clear lung fields without any infiltrates. She has QUANT GOLD pending with Dr Bronson Curb  General: does not like lot of tests or medications   OV 01/27/2017  Chief Complaint  Patient presents with  . Follow-up    Pt here after HRCT and PFT. Pt states her cough has improved since last OV. Pt states the cough is dry. Pt denies CP/tightness and recent f/c/s.     56 year old female with rheumatoid  arthritis  #Pulmonary embolism: She has decided to remain on Coumadin for life at the lower dose. H absolutely petrified about having a recurrent PE. She's not interested in the knee region.  #Chronic cough for which I started seeing her. Since last visit she stopped her Fosamax. She is taking Prilosec as needed. Cough is now further reduce to mild level but she does not think the medication changes contributing to this. She thinks its passage of time. 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 She thinks stopping the Fosamax made no difference to her cough. She is interested in restarting the Fosamax osteoporosis management. She's not interested in other regions osteoporosis  Lung nodules we discovered micronodules at the time of doing a repeat CT scan of the chest for her chronic cough. All these are stable since 2006 but one of them is enlarged. She will need a repeat CT scan in one year. I reassured her that the odds of this being malignancy is very low    Dr Lorenza Cambridge Reflux Symptom Index (> 13-15 suggestive of LPR cough)  09/29/2016   Hoarseness of problem with voice 1  Clearing  Of Throat 1  Excess throat mucus or feeling of post nasal drip 0  Difficulty swallowing food, liquid or tablets 0  Cough after eating or lying down 0  Breathing difficulties or choking episodes 0  Troublesome or annoying cough 3  Sensation of something sticking in throat or lump in throat 0  Heartburn, chest pain, indigestion, or stomach acid coming up 2  TOTAL 7     1. Blood work - quantiferon gold negative  2. CT chest 10/08/16  1. No cancer 2. No copd 3. No fibrosis 4. No pneumonia 5. lugn nodules stable but for 1 that is in RUL has grownf rom 33mm to 87mm - this is benign growth. Recommend CT chest wo contrast  in 1 year   Results for [name], [name] (MRN [id number]) as of 01/27/2017 11:22  Ref. Range 09/15/2016 00:00 10/08/2016 14:14 11/05/2016 00:00 12/10/2016 00:00 01/12/2017 00:00  INR Unknown 1.9 1.7 2.1 2.2 1.8       has a past medical history of Adjustment disorder with anxiety; B12 deficiency due to diet; Chest pain, unspecified; Chronic anticoagulation; Headache(784.0); varicella; Iron deficiency anemia, unspecified; Otalgia, unspecified; Other and unspecified hyperlipidemia; Palpitations; Personal history of venous thrombosis and embolism; Recurrent UTI; Rheumatoid arthritis(714.0); Unspecified vitamin D deficiency; and UTI (lower urinary tract infection) (11/28/2011).   reports that she has never smoked. She has never used smokeless tobacco.  Past Surgical History:  Procedure Laterality Date  . ORIF ULNAR FRACTURE    . TYMPANOSTOMY TUBE PLACEMENT  2009   Right    Allergies  Allergen Reactions  .  Boniva [Ibandronate Sodium]     Heartburn  . Ivp Dye [Iodinated Diagnostic Agents] Swelling    Mouth swelling.  . Risedronate Sodium [Risedronate Sodium] Other (See Comments)    Foot and bone pain and fatigue    Immunization History  Administered Date(s) Administered  . Influenza,inj,Quad PF,6+ Mos 04/19/2013, 06/27/2015, 03/05/2016  . PPD Test 07/21/2016  . Tdap 05/31/2013    Family History  Problem Relation Age of Onset  . Dementia Father   . Stroke Father   . Hyperlipidemia Father   . Diabetes Father   . Parkinsonism Mother   . Lymphoma Unknown      Current Outpatient Prescriptions:  .  Calcium Carb-Cholecalciferol (CALTRATE 600+D) 600-800 MG-UNIT TABS, Take by mouth., Disp: , Rfl:  .  Cholecalciferol (VITAMIN D3) 2000 UNITS TABS, Take 1 tablet by mouth daily., Disp: , Rfl:  .  COUMADIN 2.5 MG tablet, TAKE AS DIRECTED BY  COUMADIN  CLINIC, Disp: 180 tablet, Rfl: 1 .  cycloSPORINE (RESTASIS) 0.05 % ophthalmic emulsion, Place 1 drop into both eyes daily.  , Disp: , Rfl:   .  folic acid (FOLVITE) 1 MG tablet, Take 2 tablets (2 mg total) by mouth daily., Disp: 180 tablet, Rfl: 3 .  hydroxychloroquine (PLAQUENIL) 200 MG tablet, Take 1 tablet (200 mg total) by mouth daily., Disp: 90 tablet, Rfl: 0 .  methotrexate (RHEUMATREX) 2.5 MG tablet, TAKE EIGHT TABLETS BY MOUTH ONCE A WEEK, Disp: 96 tablet, Rfl: 0 .  mineral/vitamin supplement (MULTIGEN) 70 MG TABS tablet, TAKE ONE TABLET BY MOUTH ONCE DAILY., Disp: 90 tablet, Rfl: 1 .  NASCOBAL 500 MCG/0.1ML SOLN, Place 0.01 mLs (50 mcg total) into the nose once a week., Disp: 12 Bottle, Rfl: 0 .  SUMAtriptan (IMITREX) 50 MG tablet, TAKE ONE TABLET BY MOUTH AS NEEDED. MAY REPEAT IN 2 TO 4 HOURS IF NEEDED., Disp: 9 tablet, Rfl: 5 .  urea (CARMOL) 40 % CREA, APPLY  CREAM TOPICALLY EVERY DAY, Disp: 29 each, Rfl: 1   Review of Systems     Objective:   Physical Exam Vitals:   01/27/17 1109  BP: 104/78  Pulse: 62  SpO2: 98%  Weight: 168 lb (76.2 kg)  Height: 5\' 4"  (1.626 m)    Estimated body mass index is 28.84 kg/m as calculated from the following:   Height as of this encounter: 5\' 4"  (1.626 m).   Weight as of this encounter: 168 lb (76.2 kg).     Assessment:       ICD-10-CM   1. PULMONARY EMBOLISM, HX OF Z86.718   2. 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 Brand Males, M.D.,  [facility].C.P Pulmonary and Critical Care Medicine Staff Physician [place] [facility] Pager: [phone], If no answer or between  15:00h - 7:00h: call [phone]  01/27/2017 11:38 AM

## 2017-01-27 NOTE — Patient Instructions (Addendum)
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

## 2017-02-05 ENCOUNTER — Ambulatory Visit (INDEPENDENT_AMBULATORY_CARE_PROVIDER_SITE_OTHER): Payer: BC Managed Care – PPO | Admitting: Psychology

## 2017-02-05 DIAGNOSIS — F411 Generalized anxiety disorder: Secondary | ICD-10-CM

## 2017-02-06 ENCOUNTER — Encounter: Payer: Self-pay | Admitting: Internal Medicine

## 2017-02-11 ENCOUNTER — Ambulatory Visit (INDEPENDENT_AMBULATORY_CARE_PROVIDER_SITE_OTHER): Payer: BC Managed Care – PPO | Admitting: General Practice

## 2017-02-11 DIAGNOSIS — R760 Raised antibody titer: Secondary | ICD-10-CM

## 2017-02-11 DIAGNOSIS — Z5181 Encounter for therapeutic drug level monitoring: Secondary | ICD-10-CM

## 2017-02-11 DIAGNOSIS — Z23 Encounter for immunization: Secondary | ICD-10-CM

## 2017-02-11 DIAGNOSIS — Z7901 Long term (current) use of anticoagulants: Secondary | ICD-10-CM | POA: Insufficient documentation

## 2017-02-11 DIAGNOSIS — R768 Other specified abnormal immunological findings in serum: Secondary | ICD-10-CM

## 2017-02-11 LAB — POCT INR: INR: 1.7

## 2017-02-11 NOTE — Patient Instructions (Signed)
Pre visit review using our clinic review tool, if applicable. No additional management support is needed unless otherwise documented below in the visit note. 

## 2017-02-16 ENCOUNTER — Other Ambulatory Visit: Payer: Self-pay | Admitting: Rheumatology

## 2017-02-16 NOTE — Telephone Encounter (Signed)
Last Visit: 12/24/16  Next Visit: 06/01/16 Labs: 01/09/17 WNL  Okay to refill per Dr. Estanislado Pandy

## 2017-02-19 ENCOUNTER — Ambulatory Visit (INDEPENDENT_AMBULATORY_CARE_PROVIDER_SITE_OTHER): Payer: BC Managed Care – PPO | Admitting: Psychology

## 2017-02-19 DIAGNOSIS — F411 Generalized anxiety disorder: Secondary | ICD-10-CM

## 2017-03-03 ENCOUNTER — Ambulatory Visit: Payer: Self-pay | Admitting: Internal Medicine

## 2017-03-05 ENCOUNTER — Ambulatory Visit (INDEPENDENT_AMBULATORY_CARE_PROVIDER_SITE_OTHER): Payer: BC Managed Care – PPO | Admitting: Psychology

## 2017-03-05 DIAGNOSIS — F411 Generalized anxiety disorder: Secondary | ICD-10-CM

## 2017-03-11 ENCOUNTER — Ambulatory Visit (INDEPENDENT_AMBULATORY_CARE_PROVIDER_SITE_OTHER): Payer: BC Managed Care – PPO | Admitting: General Practice

## 2017-03-11 ENCOUNTER — Other Ambulatory Visit: Payer: Self-pay | Admitting: General Practice

## 2017-03-11 DIAGNOSIS — R760 Raised antibody titer: Secondary | ICD-10-CM

## 2017-03-11 DIAGNOSIS — Z7901 Long term (current) use of anticoagulants: Secondary | ICD-10-CM | POA: Diagnosis not present

## 2017-03-11 DIAGNOSIS — R768 Other specified abnormal immunological findings in serum: Secondary | ICD-10-CM

## 2017-03-11 LAB — POCT INR: INR: 1.8

## 2017-03-11 MED ORDER — COUMADIN 2.5 MG PO TABS: ORAL_TABLET | ORAL | 1 refills | Status: DC

## 2017-03-11 NOTE — Patient Instructions (Signed)
Pre visit review using our clinic review tool, if applicable. No additional management support is needed unless otherwise documented below in the visit note. 

## 2017-03-19 ENCOUNTER — Ambulatory Visit (INDEPENDENT_AMBULATORY_CARE_PROVIDER_SITE_OTHER): Payer: BC Managed Care – PPO | Admitting: Psychology

## 2017-03-19 DIAGNOSIS — F411 Generalized anxiety disorder: Secondary | ICD-10-CM

## 2017-03-23 ENCOUNTER — Other Ambulatory Visit: Payer: Self-pay | Admitting: Rheumatology

## 2017-03-23 ENCOUNTER — Other Ambulatory Visit: Payer: Self-pay | Admitting: Obstetrics and Gynecology

## 2017-03-23 DIAGNOSIS — Z1231 Encounter for screening mammogram for malignant neoplasm of breast: Secondary | ICD-10-CM

## 2017-03-23 NOTE — Telephone Encounter (Signed)
Last Visit: 12/24/16  Next Visit: 06/01/16 Labs: 01/09/17 WNL PLQ Eye Exam: 06/2016 WNL  Okay to refill per Dr. Rob Hickman

## 2017-04-02 ENCOUNTER — Ambulatory Visit: Payer: BC Managed Care – PPO | Admitting: Psychology

## 2017-04-02 DIAGNOSIS — F411 Generalized anxiety disorder: Secondary | ICD-10-CM

## 2017-04-06 ENCOUNTER — Ambulatory Visit (INDEPENDENT_AMBULATORY_CARE_PROVIDER_SITE_OTHER): Payer: BC Managed Care – PPO | Admitting: General Practice

## 2017-04-06 DIAGNOSIS — R760 Raised antibody titer: Secondary | ICD-10-CM

## 2017-04-06 DIAGNOSIS — Z7901 Long term (current) use of anticoagulants: Secondary | ICD-10-CM | POA: Diagnosis not present

## 2017-04-06 DIAGNOSIS — R768 Other specified abnormal immunological findings in serum: Secondary | ICD-10-CM

## 2017-04-06 LAB — POCT INR: INR: 1.5

## 2017-04-06 NOTE — Patient Instructions (Addendum)
Pre visit review using our clinic review tool, if applicable. No additional management support is needed unless otherwise documented below in the visit note.  Take 2 tablets daily except take 1 tablet on Thursday/Sunday.  Re-check in 4 weeks.

## 2017-04-07 NOTE — Progress Notes (Signed)
I agree with this plan.

## 2017-04-16 ENCOUNTER — Ambulatory Visit: Payer: BC Managed Care – PPO | Admitting: Psychology

## 2017-04-16 DIAGNOSIS — F411 Generalized anxiety disorder: Secondary | ICD-10-CM

## 2017-04-24 ENCOUNTER — Telehealth: Payer: Self-pay

## 2017-04-24 ENCOUNTER — Ambulatory Visit: Payer: BC Managed Care – PPO | Admitting: Internal Medicine

## 2017-04-24 ENCOUNTER — Encounter: Payer: Self-pay | Admitting: Internal Medicine

## 2017-04-24 VITALS — BP 100/70 | HR 72 | Ht 63.5 in | Wt 166.0 lb

## 2017-04-24 DIAGNOSIS — K219 Gastro-esophageal reflux disease without esophagitis: Secondary | ICD-10-CM

## 2017-04-24 DIAGNOSIS — Z1211 Encounter for screening for malignant neoplasm of colon: Secondary | ICD-10-CM

## 2017-04-24 NOTE — Telephone Encounter (Signed)
Patient and Dr. Henrene Pastor discussed several colon cancer screening options including cologuard and colonoscopy.  Patient is on a blood thinner.  If she decides to proceed with a colonoscopy, she will not need an office visit as she met with Dr. Henrene Pastor today.  She will, however, need clearance to come off her blood thinner.

## 2017-04-24 NOTE — Progress Notes (Signed)
HISTORY OF PRESENT ILLNESS:  [name] is a pleasant 56 y.o. [place] female with rheumatoid arthritis, hypercoagulable disorder evaluated at [facility], history of provoked pulmonary embolus bilaterally on chronic Coumadin therapy, and other medical problems as listed below. She is self-referred. She tells me that took care of her father in the hospital. Previous patient of Dr. Richmond Campbell. She underwent screening colonoscopy in 2008. This was normal. She wishes to transfer her care to this office. She has a multitude of questions written out on piece of paper. First, she reports intermittent problems with classic reflux symptoms for which she takes on demand PPI. She wonders if this is okay. No dysphagia. No prior upper endoscopy. She also has had some intolerance to Fosamax in terms of reflux-type symptoms. Next, she is on supplements iron and B12. She has questions about their use, delivery, and cost. I deferred most of this to her PCP. Next, she wants to discuss various screening strategies, including cologard, for colon cancer screening. She denies any lower GI complaints. No bleeding. No family history of colon cancer. Review of outside laboratories from August 2018 showed normal comprehensive metabolic panel, normal CBC, normal vitamin D level, most recent INR 1.5. Review of outside radiology from August 2009 shows normal small bowel series. Abdominal pelvic CT 2008 with possible hemorrhagic right ovarian cyst but otherwise negative. Current medications for arthritis including Plaquenil and methotrexate. She also asked that I can see her son is a patient and her son lives in [place] and apparently has GERD  REVIEW OF SYSTEMS:  All non-GI ROS negative unless otherwise stated in the history of present illness except for arthritis and fatigue  Past Medical History:  Diagnosis Date  . Adjustment disorder with anxiety   . B12 deficiency due to diet    is a vegetarian  . Chest pain,  unspecified   . Chronic anticoagulation   . Headache(784.0)    migraines and head pain  . Hx of varicella    As Child  . Iron deficiency anemia, unspecified   . Otalgia, unspecified   . Other and unspecified hyperlipidemia   . Palpitations   . Personal history of venous thrombosis and embolism   . Recurrent UTI   . Rheumatoid arthritis(714.0)    deveshewar  . Unspecified vitamin D deficiency   . UTI (lower urinary tract infection) 11/28/2011   citrobacter       Past Surgical History:  Procedure Laterality Date  . ORIF ULNAR FRACTURE    . TYMPANOSTOMY TUBE PLACEMENT  2009   Right    Social History [name]  reports that  has never smoked. she has never used smokeless tobacco. She reports that she does not drink alcohol or use drugs.  family history includes Dementia in her father; Diabetes in her father; Hyperlipidemia in her father; Lymphoma in her unknown relative; Parkinsonism in her mother; Stroke in her father.  Allergies  Allergen Reactions  . Boniva [Ibandronate Sodium]     Heartburn  . Ivp Dye [Iodinated Diagnostic Agents] Swelling    Mouth swelling.  . Risedronate Sodium [Risedronate Sodium] Other (See Comments)    Foot and bone pain and fatigue       PHYSICAL EXAMINATION: Vital signs: BP 100/70   Pulse 72   Ht 5' 3.5" (1.613 m)   Wt 166 lb (75.3 kg)   BMI 28.94 kg/m   Constitutional: Pleasant, generally well-appearing, no acute distress Psychiatric: alert and oriented x3, cooperative Eyes: extraocular movements intact, anicteric, conjunctiva  pink Mouth: oral pharynx moist, no lesions Neck: supple no lymphadenopathy Cardiovascular: heart regular rate and rhythm, no murmur Lungs: clear to auscultation bilaterally Abdomen: soft, nontender, nondistended, no obvious ascites, no peritoneal signs, normal bowel sounds, no organomegaly Rectal: Omitted Extremities: no clubbing, cyanosis, or lower extremity edema bilaterally Skin: no lesions on  visible extremities Neuro: No focal deficits. Cranial nerves intact. Normal DTRs  ASSESSMENT:  #1. Intermittent GERD without alarm features #2. Colon cancer screening. Last examination 2008 negative appropriate candidate without contraindication. Baseline risk   PLAN:  #1. reflux precautions #2. Okay to use PPI on demand #3. I discussed with the patient in great detail the following Colon cancer screening strategies. A. FIT. The nature of fit. To be done annually if negative and continue with that strategy. Positive result will illicit need for colonoscopy which may be considered diagnostic at that point is supposed screening B. Cologuard. Discussed nature. Discussed possible insurance issues. Discussed to be done every 3 years if negative and continuing with that strategy. Positive result will illicit need for colonoscopy which may be considered diagnostic at that point as opposed to screening C. Optical colonoscopy, which she is familiar. Once every 10 years if negative. Would need to address chronic and coagulation. She tells me it would be her preference to come off Coumadin for the exam.  The patient wishes to contemplate all the above and will contact our office if she wishes to schedule colonoscopy. The other stool screening strategies can be directed by her PCP  45 minutes spent face-to-face with the patient. Greater than 50% a time use for counseling regarding reflux, colon cancer screening strategies, and answering a myriad of questions to her satisfaction

## 2017-04-24 NOTE — Patient Instructions (Signed)
  It has been recommended to you by your physician that you have a colon cancer screening completed. Per your request, we did not schedule the procedure(s) today. Please contact our office at [phone] should you decide to have the procedure completed.

## 2017-04-27 ENCOUNTER — Ambulatory Visit: Payer: Self-pay

## 2017-04-30 ENCOUNTER — Ambulatory Visit: Payer: BC Managed Care – PPO | Admitting: Psychology

## 2017-04-30 DIAGNOSIS — F411 Generalized anxiety disorder: Secondary | ICD-10-CM

## 2017-05-01 ENCOUNTER — Other Ambulatory Visit: Payer: Self-pay

## 2017-05-01 DIAGNOSIS — Z79899 Other long term (current) drug therapy: Secondary | ICD-10-CM

## 2017-05-01 LAB — COMPLETE METABOLIC PANEL WITH GFR
AG Ratio: 1.5 (calc) (ref 1.0–2.5)
ALBUMIN MSPROF: 4.1 g/dL (ref 3.6–5.1)
ALKALINE PHOSPHATASE (APISO): 91 U/L (ref 33–130)
ALT: 16 U/L (ref 6–29)
AST: 20 U/L (ref 10–35)
BUN: 8 mg/dL (ref 7–25)
CALCIUM: 9.2 mg/dL (ref 8.6–10.4)
CO2: 28 mmol/L (ref 20–32)
CREATININE: 0.73 mg/dL (ref 0.50–1.05)
Chloride: 107 mmol/L (ref 98–110)
GFR, EST NON AFRICAN AMERICAN: 91 mL/min/{1.73_m2} (ref >=60)
GFR, Est African American: 105 mL/min/{1.73_m2} (ref >=60)
GLOBULIN: 2.8 g/dL (ref 1.9–3.7)
Glucose, Bld: 98 mg/dL (ref 65–99)
POTASSIUM: 4.9 mmol/L (ref 3.5–5.3)
SODIUM: 141 mmol/L (ref 135–146)
Total Bilirubin: 0.8 mg/dL (ref 0.2–1.2)
Total Protein: 6.9 g/dL (ref 6.1–8.1)

## 2017-05-01 LAB — CBC WITH DIFFERENTIAL/PLATELET
Basophils Absolute: 52 cells/uL (ref 0–200)
Basophils Relative: 0.8 %
EOS ABS: 98 {cells}/uL (ref 15–500)
Eosinophils Relative: 1.5 %
HCT: 43.6 % (ref 35.0–45.0)
Hemoglobin: 14.6 g/dL (ref 11.7–15.5)
Lymphs Abs: 2236 cells/uL (ref 850–3900)
MCH: 27.7 pg (ref 27.0–33.0)
MCHC: 33.5 g/dL (ref 32.0–36.0)
MCV: 82.6 fL (ref 80.0–100.0)
MONOS PCT: 8.1 %
MPV: 11.4 fL (ref 7.5–12.5)
NEUTROS PCT: 55.2 %
Neutro Abs: 3588 cells/uL (ref 1500–7800)
PLATELETS: 270 10*3/uL (ref 140–400)
RBC: 5.28 10*6/uL — ABNORMAL HIGH (ref 3.80–5.10)
RDW: 13 % (ref 11.0–15.0)
TOTAL LYMPHOCYTE: 34.4 %
WBC: 6.5 10*3/uL (ref 3.8–10.8)
WBCMIX: 527 {cells}/uL (ref 200–950)

## 2017-05-02 ENCOUNTER — Other Ambulatory Visit: Payer: Self-pay | Admitting: Rheumatology

## 2017-05-04 NOTE — Telephone Encounter (Signed)
Last Visit: 12/24/16  Next Visit: 06/01/16 Labs: 05/01/17 WNL  Okay to refill per Dr. Estanislado Pandy

## 2017-05-06 ENCOUNTER — Ambulatory Visit (INDEPENDENT_AMBULATORY_CARE_PROVIDER_SITE_OTHER): Payer: BC Managed Care – PPO | Admitting: General Practice

## 2017-05-06 DIAGNOSIS — R768 Other specified abnormal immunological findings in serum: Secondary | ICD-10-CM

## 2017-05-06 DIAGNOSIS — Z7901 Long term (current) use of anticoagulants: Secondary | ICD-10-CM | POA: Diagnosis not present

## 2017-05-06 DIAGNOSIS — R760 Raised antibody titer: Secondary | ICD-10-CM

## 2017-05-06 LAB — POCT INR: INR: 1.6

## 2017-05-06 NOTE — Patient Instructions (Addendum)
Pre visit review using our clinic review tool, if applicable. No additional management support is needed unless otherwise documented below in the visit note.  Take 2 tablets daily except take 1 tablet on Thursday/Sunday.  Re-check in 4 weeks.

## 2017-05-06 NOTE — Progress Notes (Signed)
I agree with this plan.

## 2017-05-14 ENCOUNTER — Ambulatory Visit: Payer: Self-pay | Admitting: Psychology

## 2017-05-18 NOTE — Progress Notes (Signed)
Office Visit Note  Patient: [name]             Date of Birth: 1960/12/06           MRN: [id number]             PCP: Burnis Medin, MD Referring: Burnis Medin, MD Visit Date: 06/01/2017 Occupation: @GUAROCC @    Subjective:  Other (BIL hand stiffness)   History of Present Illness: Ryane Konieczny is a 56 y.o. female with history of rheumatoid arthritis osteoarthritis and osteoporosis. She states she continues to have sicca symptoms. She's been also having discomfort in her right shoulder especially as she sleeps on her right side. She has some stiffness and discomfort in her hands. She has been off prednisone for a while now. She states she took one course of prednisone herself from some leftover prednisone she had. She started at 2.5 mg a day and decrease down and came off the medication after one week. She has some discomfort in her knees and her feet but no swelling. She discontinued Fosamax in May 2018 due to GI is side effects.  Activities of Daily Living:  Patient reports morning stiffness for 0 minute.   Patient Denies nocturnal pain.  Difficulty dressing/grooming: Denies Difficulty climbing stairs: Denies Difficulty getting out of chair: Denies Difficulty using hands for taps, buttons, cutlery, and/or writing: Denies   Review of Systems  Constitutional: Positive for fatigue. Negative for night sweats, weight gain, weight loss and weakness.  HENT: Positive for mouth dryness. Negative for mouth sores, trouble swallowing, trouble swallowing and nose dryness.   Eyes: Positive for dryness. Negative for pain, redness and visual disturbance.  Respiratory: Negative for cough, shortness of breath and difficulty breathing.   Cardiovascular: Negative for chest pain, palpitations, hypertension, irregular heartbeat and swelling in legs/feet.  Gastrointestinal: Negative for blood in stool, constipation and diarrhea.  Endocrine: Negative for increased urination.    Genitourinary: Negative for vaginal dryness.  Musculoskeletal: Positive for arthralgias, joint pain and morning stiffness. Negative for joint swelling, myalgias, muscle weakness, muscle tenderness and myalgias.  Skin: Negative for color change, rash, hair loss, skin tightness, ulcers and sensitivity to sunlight.  Allergic/Immunologic: Negative for susceptible to infections.  Neurological: Negative for dizziness, memory loss and night sweats.  Hematological: Negative for swollen glands.  Psychiatric/Behavioral: Negative for depressed mood and sleep disturbance. The patient is not nervous/anxious.     PMFS History:  Patient Active Problem List   Diagnosis Date Noted  . Long term (current) use of anticoagulants 02/11/2017  . Sicca syndrome ([place]) 12/22/2016  . ANA positive 12/22/2016  . 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 Migraine 03/31/2016  . Anxiety 03/31/2016  . High risk medication use 03/31/2016  . Primary osteoarthritis of both feet 03/31/2016  . Primary osteoarthritis of both knees 03/31/2016  . Stiffness of left hand joint 11/27/2014  . Elevated IOP 06/09/2014  . Long term current use of systemic steroids 06/09/2014  . Medically complex patient 06/09/2014  . Current use of steroid medication 01/09/2014  . Xerosis of skin 12/07/2013  . Encounter for therapeutic drug monitoring 06/09/2013  . B12 deficiency 05/31/2013  . Iron deficiency 05/31/2013  . Visit for preventive health examination 05/25/2012  . Mammogram abnormal 04/12/2012  . 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 Vaginal atrophy 01/23/2012  . History of recurrent UTI (urinary tract infection) 01/23/2012  . Osteoporosis 04/29/2011  . Nonspecific abnormal results of liver function study 12/08/2010  . Chronic  anticoagulation   . Anticoagulant long-term use 07/15/2010  . 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 Chronic cough 03/19/2009  . CHEST PAIN-UNSPECIFIED 08/26/2008  . PALPITATIONS,  RECURRENT 07/24/2008  . Vitamin D deficiency 05/30/2008  . HYPERLIPIDEMIA 05/30/2008  . IRON DEFICIENCY 05/30/2008  . Adjustment disorder with anxiety 05/30/2008  . Rheumatoid arthritis ([place]) 05/30/2008  . PULMONARY EMBOLISM, HX OF 05/30/2008    Past Medical History:  Diagnosis Date  . Adjustment disorder with anxiety   . B12 deficiency due to diet    is a vegetarian  . Chest pain, unspecified   . Chronic anticoagulation   . Headache(784.0)    migraines and head pain  . Hx of varicella    As Child  . Iron deficiency anemia, unspecified   . Otalgia, unspecified   . Other and unspecified hyperlipidemia   . Palpitations   . Personal history of venous thrombosis and embolism   . Recurrent UTI   . Rheumatoid arthritis(714.0)    deveshewar  . Unspecified vitamin D deficiency   . UTI (lower urinary tract infection) 11/28/2011   citrobacter       Family History  Problem Relation Age of Onset  . Dementia Father   . Stroke Father   . Hyperlipidemia Father   . Diabetes Father   . Parkinsonism Mother   . Lymphoma Unknown   . Colon cancer Neg Hx    Past Surgical History:  Procedure Laterality Date  . ORIF ULNAR FRACTURE    . TYMPANOSTOMY TUBE PLACEMENT  2009   Right   Social History   Social History Narrative   Regular Exercise-no   20 yrs in [place]   Son at [facility]   From [place].   Married HH  of 3    G1P1              Objective: Vital Signs: BP 106/62 (BP Location: Left Arm, Patient Position: Sitting, Cuff Size: Normal)   Pulse 71   Resp 14   Ht 5' 3.5" (1.613 m)   Wt 169 lb (76.7 kg)   BMI 29.47 kg/m    Physical Exam  Constitutional: She is oriented to person, place, and time. She appears well-developed and well-nourished.  HENT:  Head: Normocephalic and atraumatic.  Eyes: Conjunctivae and EOM are normal.  Neck: Normal range of motion.  Cardiovascular: Normal rate, regular rhythm, normal heart sounds and intact distal pulses.  Pulmonary/Chest: Effort  normal and breath sounds normal.  Abdominal: Soft. Bowel sounds are normal.  Lymphadenopathy:    She has no cervical adenopathy.  Neurological: She is alert and oriented to person, place, and time.  Skin: Skin is warm and dry. Capillary refill takes less than 2 seconds.  Psychiatric: She has a normal mood and affect. Her behavior is normal.  Nursing note and vitals reviewed.    Musculoskeletal Exam: C-spine and thoracic lumbar spine good range of motion. She has mild thoracic kyphosis. Shoulder joints elbow joints wrist joint MCPs PIPs DIPs with good range of motion with no synovitis. Hip joints knee joints ankles MTPs PIPs DIPs with good range of motion with no synovitis.  CDAI Exam: CDAI Homunculus Exam:   Joint Counts:  CDAI Tender Joint count: 0 CDAI Swollen Joint count: 0  Global Assessments:  Patient Global Assessment: 2 Provider Global Assessment: 2  CDAI Calculated Score: 4    Investigation: No additional findings.PLQ eye exam: 06/12/2016 CBC Latest Ref Rng & Units 05/01/2017 01/09/2017 10/01/2016  WBC 3.8 - 10.8 Thousand/uL 6.5  8.7 7.7  Hemoglobin 11.7 - 15.5 g/dL 14.6 14.5 13.7  Hematocrit 35.0 - 45.0 % 43.6 43.6 42.1  Platelets 140 - 400 Thousand/uL 270 283 258   CMP Latest Ref Rng & Units 05/01/2017 01/09/2017 10/01/2016  Glucose 65 - 99 mg/dL 98 94 92  BUN 7 - 25 mg/dL 8 10 7   Creatinine 0.50 - 1.05 mg/dL 0.73 0.77 0.80  Sodium 135 - 146 mmol/L 141 139 139  Potassium 3.5 - 5.3 mmol/L 4.9 4.4 4.6  Chloride 98 - 110 mmol/L 107 105 106  CO2 20 - 32 mmol/L 28 22 26   Calcium 8.6 - 10.4 mg/dL 9.2 9.2 9.1  Total Protein 6.1 - 8.1 g/dL 6.9 7.0 6.7  Total Bilirubin 0.2 - 1.2 mg/dL 0.8 0.5 0.5  Alkaline Phos 33 - 130 U/L - 94 80  AST 10 - 35 U/L 20 27 18   ALT 6 - 29 U/L 16 29 15     Imaging: Mm Diag Breast Tomo Bilateral  Result Date: 05/29/2017 CLINICAL DATA:  56 year old female who presented with an area of focal skin discoloration last week at the time of her  screening mammogram. She returns today for diagnostic evaluation with marked interval improvement in the area of the skin discoloration. Of note, the patient is on Coumadin and thinks this area may have been a skin bruise with interval resolution. EXAM: 2D DIGITAL DIAGNOSTIC BILATERAL MAMMOGRAM WITH CAD AND ADJUNCT TOMO COMPARISON:  Previous exam(s). ACR Breast Density Category b: There are scattered areas of fibroglandular density. FINDINGS: A radiopaque BB was placed in the region of the patient's prior skin discoloration in the upper inner left breast at middle depth. No suspicious mammographic findings are seen deep to the radiopaque BB. No additional suspicious findings are seen in either breast. Upon physical examination, no significant residual changes are identified in the region of the patient's prior skin symptoms. Mammographic images were processed with CAD. IMPRESSION: 1. No mammographic evidence of malignancy in either breast. 2. Interval resolution of the patient's prior left breast skin changes, which likely reflected a skin bruise. The patient is currently on Coumadin and states she bruises easily. RECOMMENDATION: Screening mammogram in one year.(Code:SM-B-01Y) I have discussed the findings and recommendations with the patient. Results were also provided in writing at the conclusion of the visit. If applicable, a reminder letter will be sent to the patient regarding the next appointment. BI-RADS CATEGORY  1: Negative. Electronically Signed   By: Kristopher Oppenheim M.D.   On: 05/29/2017 11:29   Xr Foot 2 Views Left  Result Date: 06/01/2017 First MTP, PIP DIP narrowing was noted. No erosive changes were noted. Small inferior and posterior calcaneal spurs were noted. Foreign object was noted in the calcaneum. Impression: These findings are consistent with osteoarthritis of the foot.  Xr Foot 2 Views Right  Result Date: 06/01/2017 First MTP, PIP DIP narrowing was noted. No erosive changes were noted.  Small calcaneal spur was noted. Impression: These findings are consistent with osteoarthritis of the foot.  Xr Hand 2 View Left  Result Date: 06/01/2017 Hardware noted in the left lung. PIP/DIP narrowing was noted. Juxta articular osteopenia was noted. No significant intercarpal, radiocarpal narrowing was noted. No erosive changes were noted. Impression: These findings are consistent with rheumatoid arthritis and osteoarthritis overlap.  Xr Hand 2 View Right  Result Date: 06/01/2017 Juxta articular osteopenia was noted. Mild third MCP narrowing was noted. PIP/DIP narrowing was noted. No significant radiocarpal or intercarpal joint space narrowing was noted. This possible  erosion versus cyst in the ulnar styloid. Impression: These findings are consistent with rheumatoid arthritis and osteoarthritis overlap.   Xr Shoulder Right  Result Date: 06/01/2017 No significant glenohumeral joint space narrowing was noted. No significant acromioclavicular joint space narrowing was noted. Impression: Unremarkable x-ray of the shoulder joint.   Speciality Comments: No specialty comments available.    Procedures:  No procedures performed Allergies: Boniva [ibandronate sodium]; Ivp dye [iodinated diagnostic agents]; Other; and Risedronate sodium [risedronate sodium]   Assessment / Plan:     Visit Diagnoses: Rheumatoid arthritis of multiple sites with negative rheumatoid factor (HCC) - RF negative, anti-CCP positive, ANA positive.  Patient reports that she has intermittent arthralgias in her hands and feet but has no synovitis on examination today. She took a course of low-dose prednisone for one week which she had left over from the previous courses. I have discouraged the use of prednisone.- Plan: Cyclic citrul peptide antibody, IgG, Rheumatoid factor, C-reactive protein  High risk medication use - Plaquenil 200 mg by mouth daily and Methotrexate 8 tablets by mouth every week, folic acid 2 mg by mouth  daily.eye exam: 06/12/2016. She has eye exam scheduled this month. Her labs will be done in February by her PCP and then every 3 months to monitor for drug toxicity.  Chronic right shoulder pain. She complains of increased pain in her right shoulder. - Plan: XR Shoulder Right  Pain in both hands. She has intermittent arthralgias in her hands. I will obtain follow-up x-rays today. - Plan: XR Hand 2 View Right, XR Hand 2 View Left  Pain in both feet. She complains of intermittent feet discomfort. - Plan: XR Foot 2 Views Right, XR Foot 2 Views Left  Primary osteoarthritis of both knees: She's not having much discomfort in her knee joints.  Primary osteoarthritis of both feet  Sicca syndrome ([place]): She continues to have sicca symptoms are controlled with over-the-counter medications.  Long term current use of systemic steroids in the past. She's been off prednisone now.  Age related osteoporosis, unspecified pathological fracture presence - Fosamax 70 mg po q week (discontinued due to reflux in May 2018), DEXA July 2018 left finger BMD 0.736 with the change of -0.3% T score of -2.5. Her DEXA scans did not include her radius. She will get that is scanned. We can determine future treatment after that.  Raised intraocular pressure of both eyes - According to patient her intraocular pressure is improved.  History of vitamin D deficiency - Plan: VITAMIN D 25 Hydroxy (Vit-D Deficiency, Fractures)  History of hyperlipidemia  History of pulmonary embolism - anticoagulated   History of anemia  History of migraine    Orders: Orders Placed This Encounter  Procedures  . XR Shoulder Right  . XR Hand 2 View Right  . XR Hand 2 View Left  . XR Foot 2 Views Right  . XR Foot 2 Views Left  . Cyclic citrul peptide antibody, IgG  . Rheumatoid factor  . VITAMIN D 25 Hydroxy (Vit-D Deficiency, Fractures)  . C-reactive protein   No orders of the defined types were placed in this  encounter.   Face-to-face time spent with patient was 30 minutes. Greater than 50% of time was spent in counseling and coordination of care.  Follow-Up Instructions: Return in about 5 months (around 10/30/2017) for Rheumatoid arthritis.   Bo Merino, MD  Note - This record has been created using Editor, commissioning.  Chart creation errors have been sought, but may not always  have been located. Such creation errors do not reflect on  the standard of medical care.

## 2017-05-26 ENCOUNTER — Other Ambulatory Visit: Payer: Self-pay | Admitting: Obstetrics and Gynecology

## 2017-05-26 ENCOUNTER — Ambulatory Visit
Admission: RE | Admit: 2017-05-26 | Discharge: 2017-05-26 | Disposition: A | Discharge status: Home / Self Care | Payer: BC Managed Care – PPO | Source: Ambulatory Visit | Attending: Obstetrics and Gynecology | Admitting: Obstetrics and Gynecology

## 2017-05-26 DIAGNOSIS — Z1231 Encounter for screening mammogram for malignant neoplasm of breast: Secondary | ICD-10-CM

## 2017-05-26 DIAGNOSIS — R234 Changes in skin texture: Secondary | ICD-10-CM

## 2017-05-28 ENCOUNTER — Ambulatory Visit: Payer: Self-pay | Admitting: Psychology

## 2017-05-29 ENCOUNTER — Ambulatory Visit
Admission: RE | Admit: 2017-05-29 | Discharge: 2017-05-29 | Disposition: A | Discharge status: Home / Self Care | Payer: BC Managed Care – PPO | Source: Ambulatory Visit | Attending: Obstetrics and Gynecology | Admitting: Obstetrics and Gynecology

## 2017-05-29 DIAGNOSIS — R234 Changes in skin texture: Secondary | ICD-10-CM

## 2017-06-01 ENCOUNTER — Ambulatory Visit (INDEPENDENT_AMBULATORY_CARE_PROVIDER_SITE_OTHER): Payer: Self-pay

## 2017-06-01 ENCOUNTER — Ambulatory Visit: Payer: BC Managed Care – PPO | Admitting: Rheumatology

## 2017-06-01 ENCOUNTER — Encounter: Payer: Self-pay | Admitting: Rheumatology

## 2017-06-01 VITALS — BP 106/62 | HR 71 | Resp 14 | Ht 63.5 in | Wt 169.0 lb

## 2017-06-01 DIAGNOSIS — G8929 Other chronic pain: Secondary | ICD-10-CM

## 2017-06-01 DIAGNOSIS — Z79899 Other long term (current) drug therapy: Secondary | ICD-10-CM | POA: Diagnosis not present

## 2017-06-01 DIAGNOSIS — M79641 Pain in right hand: Secondary | ICD-10-CM | POA: Diagnosis not present

## 2017-06-01 DIAGNOSIS — Z8669 Personal history of other diseases of the nervous system and sense organs: Secondary | ICD-10-CM

## 2017-06-01 DIAGNOSIS — M0609 Rheumatoid arthritis without rheumatoid factor, multiple sites: Secondary | ICD-10-CM

## 2017-06-01 DIAGNOSIS — M79642 Pain in left hand: Secondary | ICD-10-CM

## 2017-06-01 DIAGNOSIS — M810 Age-related osteoporosis without current pathological fracture: Secondary | ICD-10-CM | POA: Diagnosis not present

## 2017-06-01 DIAGNOSIS — M79671 Pain in right foot: Secondary | ICD-10-CM

## 2017-06-01 DIAGNOSIS — Z862 Personal history of diseases of the blood and blood-forming organs and certain disorders involving the immune mechanism: Secondary | ICD-10-CM

## 2017-06-01 DIAGNOSIS — Z8639 Personal history of other endocrine, nutritional and metabolic disease: Secondary | ICD-10-CM

## 2017-06-01 DIAGNOSIS — M25511 Pain in right shoulder: Secondary | ICD-10-CM | POA: Diagnosis not present

## 2017-06-01 DIAGNOSIS — Z7952 Long term (current) use of systemic steroids: Secondary | ICD-10-CM

## 2017-06-01 DIAGNOSIS — H40053 Ocular hypertension, bilateral: Secondary | ICD-10-CM

## 2017-06-01 DIAGNOSIS — M170 Bilateral primary osteoarthritis of knee: Secondary | ICD-10-CM

## 2017-06-01 DIAGNOSIS — Z86711 Personal history of pulmonary embolism: Secondary | ICD-10-CM

## 2017-06-01 DIAGNOSIS — M79672 Pain in left foot: Secondary | ICD-10-CM

## 2017-06-01 DIAGNOSIS — M3500 Sicca syndrome, unspecified: Secondary | ICD-10-CM

## 2017-06-01 DIAGNOSIS — M19071 Primary osteoarthritis, right ankle and foot: Secondary | ICD-10-CM | POA: Diagnosis not present

## 2017-06-01 DIAGNOSIS — M19072 Primary osteoarthritis, left ankle and foot: Secondary | ICD-10-CM

## 2017-06-01 NOTE — Patient Instructions (Addendum)
Standing Labs We placed an order today for your standing lab work.    Please come back and get your standing labs in March and every 3 months  We have open lab Monday through Friday from 8:30-11:30 AM and 1:30-4 PM at the office of Dr. Shyleigh Daughtry.   The office is located at [address], [address], [place], [place] [address] No appointment is necessary.   Labs are drawn by [facility].  You may receive a bill from [facility] for your lab work. If you have any questions regarding directions or hours of operation,  please call [phone].    

## 2017-06-03 ENCOUNTER — Ambulatory Visit (INDEPENDENT_AMBULATORY_CARE_PROVIDER_SITE_OTHER): Payer: BC Managed Care – PPO | Admitting: General Practice

## 2017-06-03 DIAGNOSIS — Z7901 Long term (current) use of anticoagulants: Secondary | ICD-10-CM

## 2017-06-03 DIAGNOSIS — R760 Raised antibody titer: Secondary | ICD-10-CM

## 2017-06-03 LAB — POCT INR: INR: 1.8

## 2017-06-03 NOTE — Patient Instructions (Addendum)
Pre visit review using our clinic review tool, if applicable. No additional management support is needed unless otherwise documented below in the visit note.  Take 2 tablets daily except take 1 tablet on Thursday/Sunday.  Re-check in 4 weeks.

## 2017-06-11 ENCOUNTER — Ambulatory Visit: Payer: BC Managed Care – PPO | Admitting: Psychology

## 2017-06-11 DIAGNOSIS — F411 Generalized anxiety disorder: Secondary | ICD-10-CM

## 2017-06-17 ENCOUNTER — Other Ambulatory Visit: Payer: Self-pay | Admitting: Rheumatology

## 2017-06-17 NOTE — Telephone Encounter (Signed)
Last Visit: 06/01/17 Next visit: 11/10/17 Labs: 05/01/17 Stable PLQ Eye Exam: 06/12/2016. She has eye exam scheduled this month  Okay to refill per Dr. Estanislado Pandy

## 2017-06-25 ENCOUNTER — Ambulatory Visit: Payer: BC Managed Care – PPO | Admitting: Psychology

## 2017-06-25 DIAGNOSIS — F411 Generalized anxiety disorder: Secondary | ICD-10-CM | POA: Diagnosis not present

## 2017-07-01 ENCOUNTER — Ambulatory Visit: Payer: BC Managed Care – PPO | Admitting: General Practice

## 2017-07-01 DIAGNOSIS — R768 Other specified abnormal immunological findings in serum: Secondary | ICD-10-CM

## 2017-07-01 DIAGNOSIS — Z7901 Long term (current) use of anticoagulants: Secondary | ICD-10-CM | POA: Diagnosis not present

## 2017-07-01 DIAGNOSIS — R760 Raised antibody titer: Secondary | ICD-10-CM

## 2017-07-01 LAB — POCT INR: INR: 1.7

## 2017-07-01 NOTE — Patient Instructions (Addendum)
Pre visit review using our clinic review tool, if applicable. No additional management support is needed unless otherwise documented below in the visit note.  Take 2 tablets daily except take 1 tablet on Thursday/Sunday.  Re-check in 4 weeks.

## 2017-07-09 ENCOUNTER — Ambulatory Visit: Payer: BC Managed Care – PPO | Admitting: Psychology

## 2017-07-09 DIAGNOSIS — F411 Generalized anxiety disorder: Secondary | ICD-10-CM | POA: Diagnosis not present

## 2017-07-13 NOTE — Progress Notes (Signed)
Chief Complaint  Patient presents with  . Annual Exam    foot pain, change from coumadin to eliquis,  B-12 alternative, acid reflux    HPI: Patient  [name]  57 y.o. comes in today for Preventive Health Care visit  And Chronic disease management   Has a list to address   PULM:  Having a  Cough still and had eval per  [facility] felt  From reflux  And had ct scan   Nodules   Not  Much  Change but will follow    Using as needed prilosec  Sparingly  And ask about ranitidine alternating .   weight gain in one year just began exercise and foot feet bothering her with some weight bearing  Bottom of foot and sometimes at heel   No injury .   b12 nasal prep now too expensive using  Has hx of low level and  Is vegetarian  Eating  What about the dissolvable b12 1000 otc that her husband is taking ?   Need iron checked   Doing better  So again disc about  Risk benefit of using newer oral agents  Such as ele quis. reversibility etc vs coumadin   Dr Henrene Pastor said a candidate for cologuard   Or colonoscopy .   RA  Dr Mike Gip changes   Consideration of reclast for  Bone health   fyi using vaiqua  Has elevaetd testosterone level in 70 range  (ULN40s)  unalbe to tolerate spironolactone cause got low bp  On 25 mg and dizzy Husband on androgel? Topical but caution to  Avoid  transmitted drug.  She has sig facial chin hair .    Health Maintenance  Topic Date Due  . COLONOSCOPY  05/19/2016  . MAMMOGRAM  05/30/2019  . PAP SMEAR  06/21/2019  . TETANUS/TDAP  06/01/2023  . INFLUENZA VACCINE  Completed  . Hepatitis C Screening  Completed  . HIV Screening  Completed   Health Maintenance Review LIFESTYLE:  Exercise:   More active joined a gym  .  And exercise bike.  Tobacco/ETS: no Alcohol:  no Sugar beverages:no Sleep:8  Drug use: no HH of  2 no pets  Son in [place]     ROS:  Some  Acid reflux and   ? Pulmonology  GEN/ HEENT: No fever, significant weight changes sweats headaches  vision problems hearing changes, CV/ PULM; No chest pain shortness of breath cough, syncope,edema  change in exercise tolerance. GI /GU: No adominal pain, vomiting, change in bowel habits. No blood in the stool. No significant GU symptoms. SKIN/HEME: ,no acute skin rashes suspicious lesions or bleeding. No lymphadenopathy, nodules, masses.  NEURO/ PSYCH:  No neurologic signs such as weakness numbness. No depression anxiety. IMM/ Allergy: No unusual infections.  Allergy .   REST of 12 system review negative except as per HPI   Past Medical History:  Diagnosis Date  . Adjustment disorder with anxiety   . B12 deficiency due to diet    is a vegetarian  . Chest pain, unspecified   . Chronic anticoagulation   . Headache(784.0)    migraines and head pain  . Hx of varicella    As Child  . Iron deficiency anemia, unspecified   . Otalgia, unspecified   . Other and unspecified hyperlipidemia   . Palpitations   . Personal history of venous thrombosis and embolism   . Recurrent UTI   . Rheumatoid arthritis(714.0)    deveshewar  .  Unspecified vitamin D deficiency   . UTI (lower urinary tract infection) 11/28/2011   citrobacter       Past Surgical History:  Procedure Laterality Date  . ORIF ULNAR FRACTURE    . TYMPANOSTOMY TUBE PLACEMENT  2009   Right    Family History  Problem Relation Age of Onset  . Dementia Father   . Stroke Father   . Hyperlipidemia Father   . Diabetes Father   . Parkinsonism Mother   . Lymphoma Unknown   . Colon cancer Neg Hx     Social History   Socioeconomic History  . Marital status: Married    Spouse name: None  . Number of children: 1  . Years of education: None  . Highest education level: None  Social Needs  . Financial resource strain: None  . Food insecurity - worry: None  . Food insecurity - inability: None  . Transportation needs - medical: None  . Transportation needs - non-medical: None  Occupational History  . Occupation: home  maker  Tobacco Use  . Smoking status: Never Smoker  . Smokeless tobacco: Never Used  Substance and Sexual Activity  . Alcohol use: No  . Drug use: No  . Sexual activity: None  Other Topics Concern  . None  Social History Narrative   Regular Exercise-no   20 yrs in [place]   Son at [facility]   From [place].   Married HH  of 3    G1P1             Outpatient Medications Prior to Visit  Medication Sig Dispense Refill  . Calcium Carb-Cholecalciferol (CALTRATE 600+D) 600-800 MG-UNIT TABS Take by mouth.    . Cholecalciferol (VITAMIN D3) 2000 UNITS TABS Take 1 tablet by mouth daily.    Marland Kitchen COUMADIN 2.5 MG tablet TAKE AS DIRECTED BY  COUMADIN  CLINIC 180 tablet 1  . cycloSPORINE (RESTASIS) 0.05 % ophthalmic emulsion Place 1 drop into both eyes daily.      . Eflornithine HCl (VANIQA) 13.9 % cream Apply topically 2 (two) times daily with a meal.    . folic acid (FOLVITE) 1 MG tablet Take 2 tablets (2 mg total) by mouth daily. 180 tablet 3  . hydroxychloroquine (PLAQUENIL) 200 MG tablet TAKE 1 TABLET(200 MG) BY MOUTH DAILY 90 tablet 0  . methotrexate 2.5 MG tablet TAKE 8 TABLETS BY MOUTH ONCE A WEEK 96 tablet 0  . mineral/vitamin supplement (MULTIGEN) 70 MG TABS tablet TAKE ONE TABLET BY MOUTH ONCE DAILY. 90 tablet 1  . NASCOBAL 500 MCG/0.1ML SOLN Place 0.01 mLs (50 mcg total) into the nose once a week. (Patient taking differently: Place 50 mcg into the nose every 14 (fourteen) days. ) 12 Bottle 0  . SUMAtriptan (IMITREX) 50 MG tablet TAKE ONE TABLET BY MOUTH AS NEEDED. MAY REPEAT IN 2 TO 4 HOURS IF NEEDED. 9 tablet 5  . urea (CARMOL) 40 % CREA APPLY  CREAM TOPICALLY EVERY DAY (Patient taking differently: as needed. APPLY  CREAM TOPICALLY EVERY DAY) 29 each 1   No facility-administered medications prior to visit.      EXAM:  BP 102/70 (BP Location: Right Arm, Patient Position: Sitting, Cuff Size: Normal)   Pulse 66   Temp 97.7 F (36.5 C) (Oral)   Ht 5' 2.5" (1.588 m)   Wt 167 lb 9.6 oz (76  kg)   BMI 30.17 kg/m   Body mass index is 30.17 kg/m. Wt Readings from Last 3 Encounters:  07/14/17 167  lb 9.6 oz (76 kg)  06/01/17 169 lb (76.7 kg)  04/24/17 166 lb (75.3 kg)    Physical Exam: Vital signs reviewed MGQ:QPYP is a well-developed well-nourished alert cooperative    who appearsr stated age in no acute distress.  HEENT: normocephalic atraumatic , Eyes: PERRL EOM's full, conjunctiva clear, Nares: paten,t no deformity discharge or tenderness., Ears: no deformity EAC's clear TMs with normal landmarks. Mouth: clear OP, no lesions, edema.  Moist mucous membranes. Dentition in adequate repair. NECK: supple without masses, thyromegaly or bruits. CHEST/PULM:  Clear to auscultation and percussion breath sounds equal no wheeze , rales or rhonchi. No chest wall deformities or tenderness. Breast: normal by inspection . No dimpling, discharge, masses, tenderness or discharge . CV: PMI is nondisplaced, S1 S2 no gallops, murmurs, rubs. Peripheral pulses are full without delay.No JVD .  ABDOMEN: Bowel sounds normal nontender  No guard or rebound, no hepato splenomegal no CVA tenderness.   Extremtities:  No clubbing cyanosis or edema, no acute joint swelling or redness no focal atrophy  Feet no acute deformity   NEURO:  Oriented x3, cranial nerves 3-12 appear to be intact, no obvious focal weakness,gait within normal limits no abnormal reflexes or asymmetrical SKIN: No acute rashes normal turgor, color, no bruising or petechiae.  cin  Lower face shaved  Facial hair  No stria   No acne  PSYCH: Oriented, good eye contact, no obvious depression anxiety, cognition and judgment appear normal. LN: no cervical axillary il adenopathy  Lab Results  Component Value Date   WBC 6.5 05/01/2017   HGB 14.6 05/01/2017   HCT 43.6 05/01/2017   PLT 270 05/01/2017   GLUCOSE 98 05/01/2017   CHOL 187 07/03/2016   TRIG 145.0 07/03/2016   HDL 72.00 07/03/2016   LDLDIRECT 97.3 05/25/2013   LDLCALC 86  07/03/2016   ALT 16 05/01/2017   AST 20 05/01/2017   NA 141 05/01/2017   K 4.9 05/01/2017   CL 107 05/01/2017   CREATININE 0.73 05/01/2017   BUN 8 05/01/2017   CO2 28 05/01/2017   TSH 3.36 07/03/2016   INR 1.7 07/01/2017    BP Readings from Last 3 Encounters:  07/14/17 102/70  06/01/17 106/62  04/24/17 100/70    Lab results reviewed with patient   ASSESSMENT AND PLAN:  Discussed the following assessment and plan:  Visit for preventive health examination - Plan: CMP, Vitamin B12, CBC with Differential/Platelet, TSH, Lipid panel, Iron, TIBC and Ferritin Panel  Medication management - Plan: CMP, Vitamin B12, CBC with Differential/Platelet, TSH, Lipid panel, Iron, TIBC and Ferritin Panel  Medically complex patient - Plan: CMP, Vitamin B12, CBC with Differential/Platelet, TSH, Lipid panel, Iron, TIBC and Ferritin Panel  Chronic anticoagulation - Plan: CMP, Vitamin B12, CBC with Differential/Platelet, TSH, Lipid panel, Iron, TIBC and Ferritin Panel  B12 deficiency - Plan: CMP, Vitamin B12, CBC with Differential/Platelet, TSH, Lipid panel, Iron, TIBC and Ferritin Panel  Rheumatoid arthritis of multiple sites with negative rheumatoid factor (HCC) - Plan: CMP, Vitamin B12, CBC with Differential/Platelet, TSH, Lipid panel, Iron, TIBC and Ferritin Panel  Iron deficiency anemia, unspecified iron deficiency anemia type - Plan: CMP, Vitamin B12, CBC with Differential/Platelet, TSH, Lipid panel, Iron, TIBC and Ferritin Panel  Anxiety - stable   Hirsutism  On methotrexate therapy  Anticardiolipin antibody positive  Foot pain, bilateral Disc  Issues one at a time   See   Plan  Options   May benefit from [facility]  Foot wear evaluation and cross  training exercises to  Help with foot pain   As well as resistance exercises  To maintain muscle mass.    Lab monitoring today  Disc elr and healthy weight loss as well as medicaiton  She may benefit from changing off  Of coumadin    To eliquis she  has / about reversibility / products   She has ?s and   Is very involved in her own health care choices .  Would advise   Consultation with Dr. Beryle Beams   To help  Plan and to  Minimize anxiety about choices. Patient Care Team: Panosh, Standley Brooking, MD as PCP - General Bo Merino, MD (Rheumatology) Richmond Campbell, MD (Gastroenterology) Leta Baptist, MD (Otolaryngology) Dohmeier, Asencion Partridge, MD (Neurology) Rolm Bookbinder, MD (Dermatology) Carolan Clines, MD as Attending Physician (Urology) Christophe Louis, MD as Consulting Physician (Obstetrics and Gynecology) Dionne Milo, Rockney Ghee, MD as Referring Physician (Hematology and Oncology) Charlotte Crumb, MD as Consulting Physician (Orthopedic Surgery) [facility] Patient Instructions  Agree with Continue lifestyle intervention healthy eating and exercise . To help weight and   reflux.  Ok to alternate  prilosec and ranitidine.   Ok to try  Oral b12 1000  mcg per day .   And we can check level  in 4-6 months  Contact.  your insurance to see if  Cover cologuard.   And if ok we can get order for this.   Consider seeing Dr.  Beryle Beams for anticoagulation advice .    Sport medicine about the   Foot pain with exercise .     Dr Stefanie Libel Dr Charlann Boxer or Dr.  Paulla Fore.     Health Maintenance, Female Adopting a healthy lifestyle and getting preventive care can go a long way to promote health and wellness. Talk with your health care provider about what schedule of regular examinations is right for you. This is a good chance for you to check in with your provider about disease prevention and staying healthy. In between checkups, there are plenty of things you can do on your own. Experts have done a lot of research about which lifestyle changes and preventive measures are most likely to keep you healthy. Ask your health care provider for more information. Weight and diet Eat a healthy diet  Be sure to include plenty of vegetables, fruits, low-fat  dairy products, and lean protein.  Do not eat a lot of foods high in solid fats, added sugars, or salt.  Get regular exercise. This is one of the most important things you can do for your health. ? Most adults should exercise for at least 150 minutes each week. The exercise should increase your heart rate and make you sweat (moderate-intensity exercise). ? Most adults should also do strengthening exercises at least twice a week. This is in addition to the moderate-intensity exercise.  Maintain a healthy weight  Body mass index (BMI) is a measurement that can be used to identify possible weight problems. It estimates body fat based on height and weight. Your health care provider can help determine your BMI and help you achieve or maintain a healthy weight.  For females 59 years of age and older: ? A BMI below 18.5 is considered underweight. ? A BMI of 18.5 to 24.9 is normal. ? A BMI of 25 to 29.9 is considered overweight. ? A BMI of 30 and above is considered obese.  Watch levels of cholesterol and blood lipids  You should start having your blood tested for lipids  and cholesterol at 59 years of age, then have this test every 5 years.  You may need to have your cholesterol levels checked more often if: ? Your lipid or cholesterol levels are high. ? You are older than 59 years of age. ? You are at high risk for heart disease.  Cancer screening Lung Cancer  Lung cancer screening is recommended for adults 50-22 years old who are at high risk for lung cancer because of a history of smoking.  A yearly low-dose CT scan of the lungs is recommended for people who: ? Currently smoke. ? Have quit within the past 15 years. ? Have at least a 30-pack-year history of smoking. A pack year is smoking an average of one pack of cigarettes a day for 1 year.  Yearly screening should continue until it has been 15 years since you quit.  Yearly screening should stop if you develop a health problem that  would prevent you from having lung cancer treatment.  Breast Cancer  Practice breast self-awareness. This means understanding how your breasts normally appear and feel.  It also means doing regular breast self-exams. Let your health care provider know about any changes, no matter how small.  If you are in your 20s or 30s, you should have a clinical breast exam (CBE) by a health care provider every 1-3 years as part of a regular health exam.  If you are 74 or older, have a CBE every year. Also consider having a breast X-ray (mammogram) every year.  If you have a family history of breast cancer, talk to your health care provider about genetic screening.  If you are at high risk for breast cancer, talk to your health care provider about having an MRI and a mammogram every year.  Breast cancer gene (BRCA) assessment is recommended for women who have family members with BRCA-related cancers. BRCA-related cancers include: ? Breast. ? Ovarian. ? Tubal. ? Peritoneal cancers.  Results of the assessment will determine the need for genetic counseling and BRCA1 and BRCA2 testing.  Cervical Cancer Your health care provider may recommend that you be screened regularly for cancer of the pelvic organs (ovaries, uterus, and vagina). This screening involves a pelvic examination, including checking for microscopic changes to the surface of your cervix (Pap test). You may be encouraged to have this screening done every 3 years, beginning at age 36.  For women ages 57-65, health care providers may recommend pelvic exams and Pap testing every 3 years, or they may recommend the Pap and pelvic exam, combined with testing for human papilloma virus (HPV), every 5 years. Some types of HPV increase your risk of cervical cancer. Testing for HPV may also be done on women of any age with unclear Pap test results.  Other health care providers may not recommend any screening for nonpregnant women who are considered low  risk for pelvic cancer and who do not have symptoms. Ask your health care provider if a screening pelvic exam is right for you.  If you have had past treatment for cervical cancer or a condition that could lead to cancer, you need Pap tests and screening for cancer for at least 20 years after your treatment. If Pap tests have been discontinued, your risk factors (such as having a new sexual partner) need to be reassessed to determine if screening should resume. Some women have medical problems that increase the chance of getting cervical cancer. In these cases, your health care provider may recommend more frequent  screening and Pap tests.  Colorectal Cancer  This type of cancer can be detected and often prevented.  Routine colorectal cancer screening usually begins at 59 years of age and continues through 59 years of age.  Your health care provider may recommend screening at an earlier age if you have risk factors for colon cancer.  Your health care provider may also recommend using home test kits to check for hidden blood in the stool.  A small camera at the end of a tube can be used to examine your colon directly (sigmoidoscopy or colonoscopy). This is done to check for the earliest forms of colorectal cancer.  Routine screening usually begins at age 18.  Direct examination of the colon should be repeated every 5-10 years through 59 years of age. However, you may need to be screened more often if early forms of precancerous polyps or small growths are found.  Skin Cancer  Check your skin from head to toe regularly.  Tell your health care provider about any new moles or changes in moles, especially if there is a change in a mole's shape or color.  Also tell your health care provider if you have a mole that is larger than the size of a pencil eraser.  Always use sunscreen. Apply sunscreen liberally and repeatedly throughout the day.  Protect yourself by wearing long sleeves, pants, a  wide-brimmed hat, and sunglasses whenever you are outside.  Heart disease, diabetes, and high blood pressure  High blood pressure causes heart disease and increases the risk of stroke. High blood pressure is more likely to develop in: ? People who have blood pressure in the high end of the normal range (130-139/85-89 mm Hg). ? People who are overweight or obese. ? People who are African American.  If you are 51-64 years of age, have your blood pressure checked every 3-5 years. If you are 7 years of age or older, have your blood pressure checked every year. You should have your blood pressure measured twice-once when you are at a hospital or clinic, and once when you are not at a hospital or clinic. Record the average of the two measurements. To check your blood pressure when you are not at a hospital or clinic, you can use: ? An automated blood pressure machine at a pharmacy. ? A home blood pressure monitor.  If you are between 58 years and 75 years old, ask your health care provider if you should take aspirin to prevent strokes.  Have regular diabetes screenings. This involves taking a blood sample to check your fasting blood sugar level. ? If you are at a normal weight and have a low risk for diabetes, have this test once every three years after 59 years of age. ? If you are overweight and have a high risk for diabetes, consider being tested at a younger age or more often. Preventing infection Hepatitis B  If you have a higher risk for hepatitis B, you should be screened for this virus. You are considered at high risk for hepatitis B if: ? You were born in a country where hepatitis B is common. Ask your health care provider which countries are considered high risk. ? Your parents were born in a high-risk country, and you have not been immunized against hepatitis B (hepatitis B vaccine). ? You have HIV or AIDS. ? You use needles to inject street drugs. ? You live with someone who has  hepatitis B. ? You have had sex with someone  who has hepatitis B. ? You get hemodialysis treatment. ? You take certain medicines for conditions, including cancer, organ transplantation, and autoimmune conditions.  Hepatitis C  Blood testing is recommended for: ? Everyone born from [birth year] through 1967. ? Anyone with known risk factors for hepatitis C.  Sexually transmitted infections (STIs)  You should be screened for sexually transmitted infections (STIs) including gonorrhea and chlamydia if: ? You are sexually active and are younger than 59 years of age. ? You are older than 59 years of age and your health care provider tells you that you are at risk for this type of infection. ? Your sexual activity has changed since you were last screened and you are at an increased risk for chlamydia or gonorrhea. Ask your health care provider if you are at risk.  If you do not have HIV, but are at risk, it may be recommended that you take a prescription medicine daily to prevent HIV infection. This is called pre-exposure prophylaxis (PrEP). You are considered at risk if: ? You are sexually active and do not regularly use condoms or know the HIV status of your partner(s). ? You take drugs by injection. ? You are sexually active with a partner who has HIV.  Talk with your health care provider about whether you are at high risk of being infected with HIV. If you choose to begin PrEP, you should first be tested for HIV. You should then be tested every 3 months for as long as you are taking PrEP. Pregnancy  If you are premenopausal and you may become pregnant, ask your health care provider about preconception counseling.  If you may become pregnant, take 400 to 800 micrograms (mcg) of folic acid every day.  If you want to prevent pregnancy, talk to your health care provider about birth control (contraception). Osteoporosis and menopause  Osteoporosis is a disease in which the bones lose minerals and  strength with aging. This can result in serious bone fractures. Your risk for osteoporosis can be identified using a bone density scan.  If you are 63 years of age or older, or if you are at risk for osteoporosis and fractures, ask your health care provider if you should be screened.  Ask your health care provider whether you should take a calcium or vitamin D supplement to lower your risk for osteoporosis.  Menopause may have certain physical symptoms and risks.  Hormone replacement therapy may reduce some of these symptoms and risks. Talk to your health care provider about whether hormone replacement therapy is right for you. Follow these instructions at home:  Schedule regular health, dental, and eye exams.  Stay current with your immunizations.  Do not use any tobacco products including cigarettes, chewing tobacco, or electronic cigarettes.  If you are pregnant, do not drink alcohol.  If you are breastfeeding, limit how much and how often you drink alcohol.  Limit alcohol intake to no more than 1 drink per day for nonpregnant women. One drink equals 12 ounces of beer, 5 ounces of wine, or 1 ounces of hard liquor.  Do not use street drugs.  Do not share needles.  Ask your health care provider for help if you need support or information about quitting drugs.  Tell your health care provider if you often feel depressed.  Tell your health care provider if you have ever been abused or do not feel safe at home. This information is not intended to replace advice given to you by your health  care provider. Make sure you discuss any questions you have with your health care provider. Document Released: 11/18/2010 Document Revised: 10/11/2015 Document Reviewed: 02/06/2015 Elsevier Interactive Patient Education  2018 [place]. Panosh M.D.

## 2017-07-14 ENCOUNTER — Encounter: Payer: Self-pay | Admitting: Internal Medicine

## 2017-07-14 ENCOUNTER — Ambulatory Visit (INDEPENDENT_AMBULATORY_CARE_PROVIDER_SITE_OTHER): Payer: BC Managed Care – PPO | Admitting: Internal Medicine

## 2017-07-14 VITALS — BP 102/70 | HR 66 | Temp 97.7°F | Ht 62.5 in | Wt 167.6 lb

## 2017-07-14 DIAGNOSIS — Z79899 Other long term (current) drug therapy: Secondary | ICD-10-CM

## 2017-07-14 DIAGNOSIS — Z7901 Long term (current) use of anticoagulants: Secondary | ICD-10-CM

## 2017-07-14 DIAGNOSIS — M79672 Pain in left foot: Secondary | ICD-10-CM

## 2017-07-14 DIAGNOSIS — E538 Deficiency of other specified B group vitamins: Secondary | ICD-10-CM | POA: Diagnosis not present

## 2017-07-14 DIAGNOSIS — L680 Hirsutism: Secondary | ICD-10-CM

## 2017-07-14 DIAGNOSIS — M79671 Pain in right foot: Secondary | ICD-10-CM | POA: Diagnosis not present

## 2017-07-14 DIAGNOSIS — Z0000 Encounter for general adult medical examination without abnormal findings: Secondary | ICD-10-CM

## 2017-07-14 DIAGNOSIS — D509 Iron deficiency anemia, unspecified: Secondary | ICD-10-CM

## 2017-07-14 DIAGNOSIS — R768 Other specified abnormal immunological findings in serum: Secondary | ICD-10-CM | POA: Diagnosis not present

## 2017-07-14 DIAGNOSIS — Z79631 Long term (current) use of antimetabolite agent: Secondary | ICD-10-CM

## 2017-07-14 DIAGNOSIS — F419 Anxiety disorder, unspecified: Secondary | ICD-10-CM

## 2017-07-14 DIAGNOSIS — R760 Raised antibody titer: Secondary | ICD-10-CM

## 2017-07-14 DIAGNOSIS — Z789 Other specified health status: Secondary | ICD-10-CM | POA: Diagnosis not present

## 2017-07-14 DIAGNOSIS — M0609 Rheumatoid arthritis without rheumatoid factor, multiple sites: Secondary | ICD-10-CM | POA: Diagnosis not present

## 2017-07-14 LAB — LIPID PANEL
CHOLESTEROL: 189 mg/dL (ref 0–200)
HDL: 71.4 mg/dL (ref >39.00)
LDL CALC: 92 mg/dL (ref 0–99)
NONHDL: 117.72
Total CHOL/HDL Ratio: 3
Triglycerides: 129 mg/dL (ref 0.0–149.0)
VLDL: 25.8 mg/dL (ref 0.0–40.0)

## 2017-07-14 LAB — CBC WITH DIFFERENTIAL/PLATELET
BASOS PCT: 0.7 % (ref 0.0–3.0)
Basophils Absolute: 0 10*3/uL (ref 0.0–0.1)
EOS ABS: 0.1 10*3/uL (ref 0.0–0.7)
EOS PCT: 1.7 % (ref 0.0–5.0)
HEMATOCRIT: 43.5 % (ref 36.0–46.0)
Hemoglobin: 14.2 g/dL (ref 12.0–15.0)
LYMPHS PCT: 33.7 % (ref 12.0–46.0)
Lymphs Abs: 2.4 10*3/uL (ref 0.7–4.0)
MCHC: 32.8 g/dL (ref 30.0–36.0)
MCV: 86 fl (ref 78.0–100.0)
MONO ABS: 0.5 10*3/uL (ref 0.1–1.0)
Monocytes Relative: 7.7 % (ref 3.0–12.0)
Neutro Abs: 3.9 10*3/uL (ref 1.4–7.7)
Neutrophils Relative %: 56.2 % (ref 43.0–77.0)
PLATELETS: 269 10*3/uL (ref 150.0–400.0)
RBC: 5.05 Mil/uL (ref 3.87–5.11)
RDW: 13.7 % (ref 11.5–15.5)
WBC: 7 10*3/uL (ref 4.0–10.5)

## 2017-07-14 LAB — VITAMIN B12: Vitamin B-12: 482 pg/mL (ref 211–911)

## 2017-07-14 LAB — COMPREHENSIVE METABOLIC PANEL
ALT: 20 U/L (ref 0–35)
AST: 19 U/L (ref 0–37)
Albumin: 4 g/dL (ref 3.5–5.2)
Alkaline Phosphatase: 91 U/L (ref 39–117)
BILIRUBIN TOTAL: 0.7 mg/dL (ref 0.2–1.2)
BUN: 10 mg/dL (ref 6–23)
CO2: 30 meq/L (ref 19–32)
Calcium: 9.6 mg/dL (ref 8.4–10.5)
Chloride: 104 mEq/L (ref 96–112)
Creatinine, Ser: 0.79 mg/dL (ref 0.40–1.20)
GFR: 79.16 mL/min (ref >60.00)
Glucose, Bld: 96 mg/dL (ref 70–99)
Potassium: 4.3 mEq/L (ref 3.5–5.1)
Sodium: 139 mEq/L (ref 135–145)
Total Protein: 7.1 g/dL (ref 6.0–8.3)

## 2017-07-14 LAB — TSH: TSH: 3.72 u[IU]/mL (ref 0.35–4.50)

## 2017-07-14 NOTE — Patient Instructions (Addendum)
Agree with Continue lifestyle intervention healthy eating and exercise . To help weight and   reflux.  Ok to alternate  prilosec and ranitidine.   Ok to try  Oral b12 1000  mcg per day .   And we can check level  in 4-6 months  Contact.  your insurance to see if  Cover cologuard.   And if ok we can get order for this.   Consider seeing Dr.  Beryle Beams for anticoagulation advice .    Sport medicine about the   Foot pain with exercise .     Dr Stefanie Libel Dr Charlann Boxer or Dr.  Paulla Fore.     Health Maintenance, Female Adopting a healthy lifestyle and getting preventive care can go a long way to promote health and wellness. Talk with your health care provider about what schedule of regular examinations is right for you. This is a good chance for you to check in with your provider about disease prevention and staying healthy. In between checkups, there are plenty of things you can do on your own. Experts have done a lot of research about which lifestyle changes and preventive measures are most likely to keep you healthy. Ask your health care provider for more information. Weight and diet Eat a healthy diet  Be sure to include plenty of vegetables, fruits, low-fat dairy products, and lean protein.  Do not eat a lot of foods high in solid fats, added sugars, or salt.  Get regular exercise. This is one of the most important things you can do for your health. ? Most adults should exercise for at least 150 minutes each week. The exercise should increase your heart rate and make you sweat (moderate-intensity exercise). ? Most adults should also do strengthening exercises at least twice a week. This is in addition to the moderate-intensity exercise.  Maintain a healthy weight  Body mass index (BMI) is a measurement that can be used to identify possible weight problems. It estimates body fat based on height and weight. Your health care provider can help determine your BMI and help you achieve or  maintain a healthy weight.  For females 11 years of age and older: ? A BMI below 18.5 is considered underweight. ? A BMI of 18.5 to 24.9 is normal. ? A BMI of 25 to 29.9 is considered overweight. ? A BMI of 30 and above is considered obese.  Watch levels of cholesterol and blood lipids  You should start having your blood tested for lipids and cholesterol at 59 years of age, then have this test every 5 years.  You may need to have your cholesterol levels checked more often if: ? Your lipid or cholesterol levels are high. ? You are older than 59 years of age. ? You are at high risk for heart disease.  Cancer screening Lung Cancer  Lung cancer screening is recommended for adults 47-55 years old who are at high risk for lung cancer because of a history of smoking.  A yearly low-dose CT scan of the lungs is recommended for people who: ? Currently smoke. ? Have quit within the past 15 years. ? Have at least a 30-pack-year history of smoking. A pack year is smoking an average of one pack of cigarettes a day for 1 year.  Yearly screening should continue until it has been 15 years since you quit.  Yearly screening should stop if you develop a health problem that would prevent you from having lung cancer treatment.  Breast  Cancer  Practice breast self-awareness. This means understanding how your breasts normally appear and feel.  It also means doing regular breast self-exams. Let your health care provider know about any changes, no matter how small.  If you are in your 20s or 30s, you should have a clinical breast exam (CBE) by a health care provider every 1-3 years as part of a regular health exam.  If you are 62 or older, have a CBE every year. Also consider having a breast X-ray (mammogram) every year.  If you have a family history of breast cancer, talk to your health care provider about genetic screening.  If you are at high risk for breast cancer, talk to your health care  provider about having an MRI and a mammogram every year.  Breast cancer gene (BRCA) assessment is recommended for women who have family members with BRCA-related cancers. BRCA-related cancers include: ? Breast. ? Ovarian. ? Tubal. ? Peritoneal cancers.  Results of the assessment will determine the need for genetic counseling and BRCA1 and BRCA2 testing.  Cervical Cancer Your health care provider may recommend that you be screened regularly for cancer of the pelvic organs (ovaries, uterus, and vagina). This screening involves a pelvic examination, including checking for microscopic changes to the surface of your cervix (Pap test). You may be encouraged to have this screening done every 3 years, beginning at age 59.  For women ages 66-65, health care providers may recommend pelvic exams and Pap testing every 3 years, or they may recommend the Pap and pelvic exam, combined with testing for human papilloma virus (HPV), every 5 years. Some types of HPV increase your risk of cervical cancer. Testing for HPV may also be done on women of any age with unclear Pap test results.  Other health care providers may not recommend any screening for nonpregnant women who are considered low risk for pelvic cancer and who do not have symptoms. Ask your health care provider if a screening pelvic exam is right for you.  If you have had past treatment for cervical cancer or a condition that could lead to cancer, you need Pap tests and screening for cancer for at least 20 years after your treatment. If Pap tests have been discontinued, your risk factors (such as having a new sexual partner) need to be reassessed to determine if screening should resume. Some women have medical problems that increase the chance of getting cervical cancer. In these cases, your health care provider may recommend more frequent screening and Pap tests.  Colorectal Cancer  This type of cancer can be detected and often prevented.  Routine  colorectal cancer screening usually begins at 59 years of age and continues through 59 years of age.  Your health care provider may recommend screening at an earlier age if you have risk factors for colon cancer.  Your health care provider may also recommend using home test kits to check for hidden blood in the stool.  A small camera at the end of a tube can be used to examine your colon directly (sigmoidoscopy or colonoscopy). This is done to check for the earliest forms of colorectal cancer.  Routine screening usually begins at age 67.  Direct examination of the colon should be repeated every 5-10 years through 59 years of age. However, you may need to be screened more often if early forms of precancerous polyps or small growths are found.  Skin Cancer  Check your skin from head to toe regularly.  Tell your  health care provider about any new moles or changes in moles, especially if there is a change in a mole's shape or color.  Also tell your health care provider if you have a mole that is larger than the size of a pencil eraser.  Always use sunscreen. Apply sunscreen liberally and repeatedly throughout the day.  Protect yourself by wearing long sleeves, pants, a wide-brimmed hat, and sunglasses whenever you are outside.  Heart disease, diabetes, and high blood pressure  High blood pressure causes heart disease and increases the risk of stroke. High blood pressure is more likely to develop in: ? People who have blood pressure in the high end of the normal range (130-139/85-89 mm Hg). ? People who are overweight or obese. ? People who are African American.  If you are 93-57 years of age, have your blood pressure checked every 3-5 years. If you are 16 years of age or older, have your blood pressure checked every year. You should have your blood pressure measured twice-once when you are at a hospital or clinic, and once when you are not at a hospital or clinic. Record the average of the  two measurements. To check your blood pressure when you are not at a hospital or clinic, you can use: ? An automated blood pressure machine at a pharmacy. ? A home blood pressure monitor.  If you are between 11 years and 56 years old, ask your health care provider if you should take aspirin to prevent strokes.  Have regular diabetes screenings. This involves taking a blood sample to check your fasting blood sugar level. ? If you are at a normal weight and have a low risk for diabetes, have this test once every three years after 59 years of age. ? If you are overweight and have a high risk for diabetes, consider being tested at a younger age or more often. Preventing infection Hepatitis B  If you have a higher risk for hepatitis B, you should be screened for this virus. You are considered at high risk for hepatitis B if: ? You were born in a country where hepatitis B is common. Ask your health care provider which countries are considered high risk. ? Your parents were born in a high-risk country, and you have not been immunized against hepatitis B (hepatitis B vaccine). ? You have HIV or AIDS. ? You use needles to inject street drugs. ? You live with someone who has hepatitis B. ? You have had sex with someone who has hepatitis B. ? You get hemodialysis treatment. ? You take certain medicines for conditions, including cancer, organ transplantation, and autoimmune conditions.  Hepatitis C  Blood testing is recommended for: ? Everyone born from [birth year] through 1967. ? Anyone with known risk factors for hepatitis C.  Sexually transmitted infections (STIs)  You should be screened for sexually transmitted infections (STIs) including gonorrhea and chlamydia if: ? You are sexually active and are younger than 58 years of age. ? You are older than 59 years of age and your health care provider tells you that you are at risk for this type of infection. ? Your sexual activity has changed since you  were last screened and you are at an increased risk for chlamydia or gonorrhea. Ask your health care provider if you are at risk.  If you do not have HIV, but are at risk, it may be recommended that you take a prescription medicine daily to prevent HIV infection. This is called pre-exposure prophylaxis (  PrEP). You are considered at risk if: ? You are sexually active and do not regularly use condoms or know the HIV status of your partner(s). ? You take drugs by injection. ? You are sexually active with a partner who has HIV.  Talk with your health care provider about whether you are at high risk of being infected with HIV. If you choose to begin PrEP, you should first be tested for HIV. You should then be tested every 3 months for as long as you are taking PrEP. Pregnancy  If you are premenopausal and you may become pregnant, ask your health care provider about preconception counseling.  If you may become pregnant, take 400 to 800 micrograms (mcg) of folic acid every day.  If you want to prevent pregnancy, talk to your health care provider about birth control (contraception). Osteoporosis and menopause  Osteoporosis is a disease in which the bones lose minerals and strength with aging. This can result in serious bone fractures. Your risk for osteoporosis can be identified using a bone density scan.  If you are 106 years of age or older, or if you are at risk for osteoporosis and fractures, ask your health care provider if you should be screened.  Ask your health care provider whether you should take a calcium or vitamin D supplement to lower your risk for osteoporosis.  Menopause may have certain physical symptoms and risks.  Hormone replacement therapy may reduce some of these symptoms and risks. Talk to your health care provider about whether hormone replacement therapy is right for you. Follow these instructions at home:  Schedule regular health, dental, and eye exams.  Stay current  with your immunizations.  Do not use any tobacco products including cigarettes, chewing tobacco, or electronic cigarettes.  If you are pregnant, do not drink alcohol.  If you are breastfeeding, limit how much and how often you drink alcohol.  Limit alcohol intake to no more than 1 drink per day for nonpregnant women. One drink equals 12 ounces of beer, 5 ounces of wine, or 1 ounces of hard liquor.  Do not use street drugs.  Do not share needles.  Ask your health care provider for help if you need support or information about quitting drugs.  Tell your health care provider if you often feel depressed.  Tell your health care provider if you have ever been abused or do not feel safe at home. This information is not intended to replace advice given to you by your health care provider. Make sure you discuss any questions you have with your health care provider. Document Released: 11/18/2010 Document Revised: 10/11/2015 Document Reviewed: 02/06/2015 Elsevier Interactive Patient Education  [employer].

## 2017-07-15 LAB — IRON,TIBC AND FERRITIN PANEL
%SAT: 26 % (calc) (ref 11–50)
Ferritin: 171 ng/mL (ref 10–232)
IRON: 82 ug/dL (ref 45–160)
TIBC: 313 mcg/dL (calc) (ref 250–450)

## 2017-07-23 ENCOUNTER — Ambulatory Visit: Payer: BC Managed Care – PPO | Admitting: Psychology

## 2017-07-23 DIAGNOSIS — F411 Generalized anxiety disorder: Secondary | ICD-10-CM | POA: Diagnosis not present

## 2017-07-27 ENCOUNTER — Other Ambulatory Visit: Payer: Self-pay | Admitting: Rheumatology

## 2017-07-27 NOTE — Telephone Encounter (Signed)
Last Visit: 06/01/17 Next visit: 11/10/17 Labs: 07/14/17 WNL  Okay to refill per Dr. Estanislado Pandy

## 2017-07-29 ENCOUNTER — Ambulatory Visit (INDEPENDENT_AMBULATORY_CARE_PROVIDER_SITE_OTHER): Payer: BC Managed Care – PPO | Admitting: General Practice

## 2017-07-29 DIAGNOSIS — R768 Other specified abnormal immunological findings in serum: Secondary | ICD-10-CM

## 2017-07-29 DIAGNOSIS — Z7901 Long term (current) use of anticoagulants: Secondary | ICD-10-CM | POA: Diagnosis not present

## 2017-07-29 DIAGNOSIS — R760 Raised antibody titer: Secondary | ICD-10-CM

## 2017-07-29 LAB — POCT INR: INR: 1.9

## 2017-07-29 NOTE — Patient Instructions (Addendum)
Pre visit review using our clinic review tool, if applicable. No additional management support is needed unless otherwise documented below in the visit note.  Take 2 tablets daily except take 1 tablet on Thursday/Sunday.  Re-check in 4 weeks.

## 2017-08-06 ENCOUNTER — Ambulatory Visit (INDEPENDENT_AMBULATORY_CARE_PROVIDER_SITE_OTHER): Payer: BC Managed Care – PPO | Admitting: Psychology

## 2017-08-06 DIAGNOSIS — F411 Generalized anxiety disorder: Secondary | ICD-10-CM | POA: Diagnosis not present

## 2017-08-20 ENCOUNTER — Ambulatory Visit: Payer: BC Managed Care – PPO | Admitting: Psychology

## 2017-08-20 DIAGNOSIS — F411 Generalized anxiety disorder: Secondary | ICD-10-CM

## 2017-08-26 ENCOUNTER — Ambulatory Visit: Payer: BC Managed Care – PPO | Admitting: Internal Medicine

## 2017-08-26 ENCOUNTER — Ambulatory Visit: Payer: BC Managed Care – PPO | Admitting: General Practice

## 2017-08-26 ENCOUNTER — Encounter: Payer: Self-pay | Admitting: Internal Medicine

## 2017-08-26 VITALS — BP 102/70 | HR 67 | Temp 97.5°F | Wt 166.2 lb

## 2017-08-26 DIAGNOSIS — Z7901 Long term (current) use of anticoagulants: Secondary | ICD-10-CM

## 2017-08-26 DIAGNOSIS — R42 Dizziness and giddiness: Secondary | ICD-10-CM | POA: Diagnosis not present

## 2017-08-26 DIAGNOSIS — Z789 Other specified health status: Secondary | ICD-10-CM

## 2017-08-26 LAB — POCT INR: INR: 1.6

## 2017-08-26 MED ORDER — ONDANSETRON 4 MG PO TBDP
4.0000 mg | ORAL_TABLET | Freq: Three times a day (TID) | ORAL | 0 refills | Status: DC | PRN
Start: 2017-08-26 — End: 2018-01-06

## 2017-08-26 NOTE — Patient Instructions (Addendum)
Pre visit review using our clinic review tool, if applicable. No additional management support is needed unless otherwise documented below in the visit note.  Take 2 tablets daily except take 1 tablet on Thursday/Sunday.  Re-check in 4 weeks.

## 2017-08-26 NOTE — Patient Instructions (Addendum)
This acts like vertigo    Which  Causes .    Seasickness type of   Feeling can get   Caused  Usually by an inner ear problem  Most of these resolve    But willfollow up  If not happening.   I dont think you have have a bleed based on hx and exam .   Medication can helps sx but   Do not cure this .   If getting headache  Or new sx and ongoing we can do more evaluation .   Get your INR today .  Will send in zofran for nausea and vomiting   You can try OTc meclizine    Up to 3 x per day for   vertigo ( sea sickness )  As it may help you feel better( but Can cause Drowsiness)  Working on keeping fluids down first before solids        Benign Positional Vertigo Vertigo is the feeling that you or your surroundings are moving when they are not. Benign positional vertigo is the most common form of vertigo. The cause of this condition is not serious (is benign). This condition is triggered by certain movements and positions (is positional). This condition can be dangerous if it occurs while you are doing something that could endanger you or others, such as driving. What are the causes? In many cases, the cause of this condition is not known. It may be caused by a disturbance in an area of the inner ear that helps your brain to sense movement and balance. This disturbance can be caused by a viral infection (labyrinthitis), head injury, or repetitive motion. What increases the risk? This condition is more likely to develop in:  Women.  People who are 59 years of age or older.  What are the signs or symptoms? Symptoms of this condition usually happen when you move your head or your eyes in different directions. Symptoms may start suddenly, and they usually last for less than a minute. Symptoms may include:  Loss of balance and falling.  Feeling like you are spinning or moving.  Feeling like your surroundings are spinning or moving.  Nausea and vomiting.  Blurred  vision.  Dizziness.  Involuntary eye movement (nystagmus).  Symptoms can be mild and cause only slight annoyance, or they can be severe and interfere with daily life. Episodes of benign positional vertigo may return (recur) over time, and they may be triggered by certain movements. Symptoms may improve over time. How is this diagnosed? This condition is usually diagnosed by medical history and a physical exam of the head, neck, and ears. You may be referred to a health care provider who specializes in ear, nose, and throat (ENT) problems (otolaryngologist) or a provider who specializes in disorders of the nervous system (neurologist). You may have additional testing, including:  MRI.  A CT scan.  Eye movement tests. Your health care provider may ask you to change positions quickly while he or she watches you for symptoms of benign positional vertigo, such as nystagmus. Eye movement may be tested with an electronystagmogram (ENG), caloric stimulation, the Dix-Hallpike test, or the roll test.  An electroencephalogram (EEG). This records electrical activity in your brain.  Hearing tests.  How is this treated? Usually, your health care provider will treat this by moving your head in specific positions to adjust your inner ear back to normal. Surgery may be needed in severe cases, but this is rare. In some cases,  benign positional vertigo may resolve on its own in 2-4 weeks. Follow these instructions at home: Safety  Move slowly.Avoid sudden body or head movements.  Avoid driving.  Avoid operating heavy machinery.  Avoid doing any tasks that would be dangerous to you or others if a vertigo episode would occur.  If you have trouble walking or keeping your balance, try using a cane for stability. If you feel dizzy or unstable, sit down right away.  Return to your normal activities as told by your health care provider. Ask your health care provider what activities are safe for  you. General instructions  Take over-the-counter and prescription medicines only as told by your health care provider.  Avoid certain positions or movements as told by your health care provider.  Drink enough fluid to keep your urine clear or pale yellow.  Keep all follow-up visits as told by your health care provider. This is important. Contact a health care provider if:  You have a fever.  Your condition gets worse or you develop new symptoms.  Your family or friends notice any behavioral changes.  Your nausea or vomiting gets worse.  You have numbness or a "pins and needles" sensation. Get help right away if:  You have difficulty speaking or moving.  You are always dizzy.  You faint.  You develop severe headaches.  You have weakness in your legs or arms.  You have changes in your hearing or vision.  You develop a stiff neck.  You develop sensitivity to light. This information is not intended to replace advice given to you by your health care provider. Make sure you discuss any questions you have with your health care provider. Document Released: 02/10/2006 Document Revised: 10/11/2015 Document Reviewed: 08/28/2014 Elsevier Interactive Patient Education  2018 [employer]. Vertigo Vertigo is the feeling that you or your surroundings are moving when they are not. Vertigo can be dangerous if it occurs while you are doing something that could endanger you or others, such as driving. What are the causes? This condition is caused by a disturbance in the signals that are sent by your body's sensory systems to your brain. Different causes of a disturbance can lead to vertigo, including:  Infections, especially in the inner ear.  A bad reaction to a drug, or misuse of alcohol and medicines.  Withdrawal from drugs or alcohol.  Quickly changing positions, as when lying down or rolling over in bed.  Migraine headaches.  Decreased blood flow to the brain.  Decreased  blood pressure.  Increased pressure in the brain from a head or neck injury, stroke, infection, tumor, or bleeding.  Central nervous system disorders.  What are the signs or symptoms? Symptoms of this condition usually occur when you move your head or your eyes in different directions. Symptoms may start suddenly, and they usually last for less than a minute. Symptoms may include:  Loss of balance and falling.  Feeling like you are spinning or moving.  Feeling like your surroundings are spinning or moving.  Nausea and vomiting.  Blurred vision or double vision.  Difficulty hearing.  Slurred speech.  Dizziness.  Involuntary eye movement (nystagmus).  Symptoms can be mild and cause only slight annoyance, or they can be severe and interfere with daily life. Episodes of vertigo may return (recur) over time, and they are often triggered by certain movements. Symptoms may improve over time. How is this diagnosed? This condition may be diagnosed based on medical history and the quality of your  nystagmus. Your health care provider may test your eye movements by asking you to quickly change positions to trigger the nystagmus. This may be called the Dix-Hallpike test, head thrust test, or roll test. You may be referred to a health care provider who specializes in ear, nose, and throat (ENT) problems (otolaryngologist) or a provider who specializes in disorders of the central nervous system (neurologist). You may have additional testing, including:  A physical exam.  Blood tests.  MRI.  A CT scan.  An electrocardiogram (ECG). This records electrical activity in your heart.  An electroencephalogram (EEG). This records electrical activity in your brain.  Hearing tests.  How is this treated? Treatment for this condition depends on the cause and the severity of the symptoms. Treatment options include:  Medicines to treat nausea or vertigo. These are usually used for severe cases.  Some medicines that are used to treat other conditions may also reduce or eliminate vertigo symptoms. These include: ? Medicines that control allergies (antihistamines). ? Medicines that control seizures (anticonvulsants). ? Medicines that relieve depression (antidepressants). ? Medicines that relieve anxiety (sedatives).  Head movements to adjust your inner ear back to normal. If your vertigo is caused by an ear problem, your health care provider may recommend certain movements to correct the problem.  Surgery. This is rare.  Follow these instructions at home: Safety  Move slowly.Avoid sudden body or head movements.  Avoid driving.  Avoid operating heavy machinery.  Avoid doing any tasks that would cause danger to you or others if you would have a vertigo episode during the task.  If you have trouble walking or keeping your balance, try using a cane for stability. If you feel dizzy or unstable, sit down right away.  Return to your normal activities as told by your health care provider. Ask your health care provider what activities are safe for you. General instructions  Take over-the-counter and prescription medicines only as told by your health care provider.  Avoid certain positions or movements as told by your health care provider.  Drink enough fluid to keep your urine clear or pale yellow.  Keep all follow-up visits as told by your health care provider. This is important. Contact a health care provider if:  Your medicines do not relieve your vertigo or they make it worse.  You have a fever.  Your condition gets worse or you develop new symptoms.  Your family or friends notice any behavioral changes.  Your nausea or vomiting gets worse.  You have numbness or a "pins and needles" sensation in part of your body. Get help right away if:  You have difficulty moving or speaking.  You are always dizzy.  You faint.  You develop severe headaches.  You have  weakness in your hands, arms, or legs.  You have changes in your hearing or vision.  You develop a stiff neck.  You develop sensitivity to light. This information is not intended to replace advice given to you by your health care provider. Make sure you discuss any questions you have with your health care provider. Document Released: 02/12/2005 Document Revised: 10/17/2015 Document Reviewed: 08/28/2014 Elsevier Interactive Patient Education  [employer].

## 2017-08-26 NOTE — Progress Notes (Signed)
Chief Complaint  Patient presents with  . Dizziness    Pt states that when she woke up this morning she was very dizzy and was unable to move to get out of bed. Pt states that her symptoms usually last only about 10-79mins in the past but this time it wont go away. Pt has been vomiting this morning and loose stools this morning. Pt denies any sensitivity to light or sound. Pt has an appt with the Coumadin clinic to check her levels.     HPI: [name] 57 y.o. come in for  Acute visit with husband  Because of acute onset this am when [facility] of spinning dizziness that she has had before but never last ed lng  No fever  Some vomiting  But no cold headache injury fall or bleeding .   No one else with similar sx .   No hearing changes  Worried because this is longer that prev  problesm   No new meds x vaniqua   Topical  ROS: See pertinent positives and negatives per HPI. No cp sob  locallized numbesns weakness or falling no vision changes  Feels not right   though  Past Medical History:  Diagnosis Date  . Adjustment disorder with anxiety   . B12 deficiency due to diet    is a vegetarian  . Chest pain, unspecified   . Chronic anticoagulation   . Headache(784.0)    migraines and head pain  . Hx of varicella    As Child  . Iron deficiency anemia, unspecified   . Otalgia, unspecified   . Other and unspecified hyperlipidemia   . Palpitations   . Personal history of venous thrombosis and embolism   . Recurrent UTI   . Rheumatoid arthritis(714.0)    deveshewar  . Unspecified vitamin D deficiency   . UTI (lower urinary tract infection) 11/28/2011   citrobacter       Family History  Problem Relation Age of Onset  . Dementia Father   . Stroke Father   . Hyperlipidemia Father   . Diabetes Father   . Parkinsonism Mother   . Lymphoma Unknown   . Colon cancer Neg Hx     Social History   Socioeconomic History  . Marital status: Married    Spouse name: Not on file  . Number  of children: 1  . Years of education: Not on file  . Highest education level: Not on file  Occupational History  . Occupation: home maker  Social Needs  . Financial resource strain: Not on file  . Food insecurity:    Worry: Not on file    Inability: Not on file  . Transportation needs:    Medical: Not on file    Non-medical: Not on file  Tobacco Use  . Smoking status: Never Smoker  . Smokeless tobacco: Never Used  Substance and Sexual Activity  . Alcohol use: No  . Drug use: No  . Sexual activity: Not on file  Lifestyle  . Physical activity:    Days per week: Not on file    Minutes per session: Not on file  . Stress: Not on file  Relationships  . Social connections:    Talks on phone: Not on file    Gets together: Not on file    Attends religious service: Not on file    Active member of club or organization: Not on file    Attends meetings of clubs or organizations: Not on file  Relationship status: Not on file  Other Topics Concern  . Not on file  Social History Narrative   Regular Exercise-no   20 yrs in [place]   Son at [employer] [facility] [employer]   From [place].   Married HH  of 3    G1P1             Outpatient Medications Prior to Visit  Medication Sig Dispense Refill  . Calcium Carb-Cholecalciferol (CALTRATE 600+D) 600-800 MG-UNIT TABS Take by mouth.    . Cholecalciferol (VITAMIN D3) 2000 UNITS TABS Take 1 tablet by mouth daily.    Marland Kitchen COUMADIN 2.5 MG tablet TAKE AS DIRECTED BY  COUMADIN  CLINIC 180 tablet 1  . cycloSPORINE (RESTASIS) 0.05 % ophthalmic emulsion Place 1 drop into both eyes daily.      . Eflornithine HCl (VANIQA) 13.9 % cream Apply topically 2 (two) times daily with a meal.    . folic acid (FOLVITE) 1 MG tablet Take 2 tablets (2 mg total) by mouth daily. 180 tablet 3  . hydroxychloroquine (PLAQUENIL) 200 MG tablet TAKE 1 TABLET(200 MG) BY MOUTH DAILY 90 tablet 0  . methotrexate 2.5 MG tablet TAKE 8 TABLETS BY MOUTH ONCE A WEEK. 96 tablet 0  . mineral/vitamin  supplement (MULTIGEN) 70 MG TABS tablet TAKE ONE TABLET BY MOUTH ONCE DAILY. 90 tablet 1  . NASCOBAL 500 MCG/0.1ML SOLN Place 0.01 mLs (50 mcg total) into the nose once a week. (Patient taking differently: Place 50 mcg into the nose every 14 (fourteen) days. ) 12 Bottle 0  . SUMAtriptan (IMITREX) 50 MG tablet TAKE ONE TABLET BY MOUTH AS NEEDED. MAY REPEAT IN 2 TO 4 HOURS IF NEEDED. 9 tablet 5  . urea (CARMOL) 40 % CREA APPLY  CREAM TOPICALLY EVERY DAY (Patient taking differently: as needed. APPLY  CREAM TOPICALLY EVERY DAY) 29 each 1   No facility-administered medications prior to visit.      EXAM:  BP 102/70 (BP Location: Right Arm, Patient Position: Sitting, Cuff Size: Normal)   Pulse 67   Temp (!) 97.5 F (36.4 C) (Oral)   Wt 166 lb 3.2 oz (75.4 kg)   BMI 29.91 kg/m   Body mass index is 29.91 kg/m.  GENERAL: vitals reviewed and listed above, alert, oriented, appears well hydrated and in no acute distress doesn ot feel well but  Cognition speech is normal and non focal exam  walks well but gets dizzy when turns quickly  Worse when form laying to uprights  HEENT: atraumatic, conjunctiva  clear, no obvious abnormalities on inspection of external nose and ears tms nl eoms nl   OP : no lesion edema or exudate  Tongue midline  NECK: no obvious masses on inspection palpation  Supple  LUNGS: clear to auscultation bilaterally, no wheezes, rales or rhonchi, good air movement CV: HRRR, no clubbing cyanosis or  peripheral edema nl cap refill  Abdomen:  Sof,t normal bowel sounds without hepatosplenomegaly, no guarding rebound or masses no CVA tenderness Neuro cn 3-12 seem intact  And nl dtrs   Non focal  MS: moves all extremities without noticeable focal  abnormality PSYCH: pleasant and cooperative,   Gait  non focal and nl based  Lab Results  Component Value Date   WBC 7.0 07/14/2017   HGB 14.2 07/14/2017   HCT 43.5 07/14/2017   PLT 269.0 07/14/2017   GLUCOSE 96 07/14/2017   CHOL 189  07/14/2017   TRIG 129.0 07/14/2017   HDL 71.40 07/14/2017   LDLDIRECT 97.3 05/25/2013  [place] 92 07/14/2017   ALT 20 07/14/2017   AST 19 07/14/2017   NA 139 07/14/2017   K 4.3 07/14/2017   CL 104 07/14/2017   CREATININE 0.79 07/14/2017   BUN 10 07/14/2017   CO2 30 07/14/2017   TSH 3.72 07/14/2017   INR 1.6 08/26/2017   BP Readings from Last 3 Encounters:  08/26/17 102/70  07/14/17 102/70  06/01/17 106/62    ASSESSMENT AND PLAN:  Discussed the following assessment and plan:  Dizziness - Plan: POC INR  Vertigo - Plan: POC INR  Long term (current) use of anticoagulants  Medically complex patient prob vestibular vertigo  Based on hx and exam  Uncertain trigger but not new  And no evidence of bleed  Or focal neuro sx   And cv nl  And exam is reassuring    Did not do many provocative tests  But positional  Sitting upright  Seems to  Be the trigger    Check inr today  Sx rx  And if  persistent or progressive or alarm sx will plan further work up  .     Risk benefit of medication discussed.  -Patient advised to return or notify health care team  if  new concerns arise.  Patient Instructions  This acts like vertigo    Which  Causes .    Seasickness type of   Feeling can get   Caused  Usually by an inner ear problem  Most of these resolve    But willfollow up  If not happening.   I dont think you have have a bleed based on hx and exam .   Medication can helps sx but   Do not cure this .   If getting headache  Or new sx and ongoing we can do more evaluation .   Get your INR today .  Will send in zofran for nausea and vomiting   You can try OTc meclizine    Up to 3 x per day for   vertigo ( sea sickness )  As it may help you feel better( but Can cause Drowsiness)  Working on keeping fluids down first before solids        Benign Positional Vertigo Vertigo is the feeling that you or your surroundings are moving when they are not. Benign positional vertigo is the  most common form of vertigo. The cause of this condition is not serious (is benign). This condition is triggered by certain movements and positions (is positional). This condition can be dangerous if it occurs while you are doing something that could endanger you or others, such as driving. What are the causes? In many cases, the cause of this condition is not known. It may be caused by a disturbance in an area of the inner ear that helps your brain to sense movement and balance. This disturbance can be caused by a viral infection (labyrinthitis), head injury, or repetitive motion. What increases the risk? This condition is more likely to develop in:  Women.  People who are 95 years of age or older.  What are the signs or symptoms? Symptoms of this condition usually happen when you move your head or your eyes in different directions. Symptoms may start suddenly, and they usually last for less than a minute. Symptoms may include:  Loss of balance and falling.  Feeling like you are spinning or moving.  Feeling like your surroundings are spinning or moving.  Nausea and vomiting.  Blurred vision.  Dizziness.  Involuntary eye movement (nystagmus).  Symptoms can be mild and cause only slight annoyance, or they can be severe and interfere with daily life. Episodes of benign positional vertigo may return (recur) over time, and they may be triggered by certain movements. Symptoms may improve over time. How is this diagnosed? This condition is usually diagnosed by medical history and a physical exam of the head, neck, and ears. You may be referred to a health care provider who specializes in ear, nose, and throat (ENT) problems (otolaryngologist) or a provider who specializes in disorders of the nervous system (neurologist). You may have additional testing, including:  MRI.  A CT scan.  Eye movement tests. Your health care provider may ask you to change positions quickly while he or she  watches you for symptoms of benign positional vertigo, such as nystagmus. Eye movement may be tested with an electronystagmogram (ENG), caloric stimulation, the Dix-Hallpike test, or the roll test.  An electroencephalogram (EEG). This records electrical activity in your brain.  Hearing tests.  How is this treated? Usually, your health care provider will treat this by moving your head in specific positions to adjust your inner ear back to normal. Surgery may be needed in severe cases, but this is rare. In some cases, benign positional vertigo may resolve on its own in 2-4 weeks. Follow these instructions at home: Safety  Move slowly.Avoid sudden body or head movements.  Avoid driving.  Avoid operating heavy machinery.  Avoid doing any tasks that would be dangerous to you or others if a vertigo episode would occur.  If you have trouble walking or keeping your balance, try using a cane for stability. If you feel dizzy or unstable, sit down right away.  Return to your normal activities as told by your health care provider. Ask your health care provider what activities are safe for you. General instructions  Take over-the-counter and prescription medicines only as told by your health care provider.  Avoid certain positions or movements as told by your health care provider.  Drink enough fluid to keep your urine clear or pale yellow.  Keep all follow-up visits as told by your health care provider. This is important. Contact a health care provider if:  You have a fever.  Your condition gets worse or you develop new symptoms.  Your family or friends notice any behavioral changes.  Your nausea or vomiting gets worse.  You have numbness or a "pins and needles" sensation. Get help right away if:  You have difficulty speaking or moving.  You are always dizzy.  You faint.  You develop severe headaches.  You have weakness in your legs or arms.  You have changes in your hearing  or vision.  You develop a stiff neck.  You develop sensitivity to light. This information is not intended to replace advice given to you by your health care provider. Make sure you discuss any questions you have with your health care provider. Document Released: 02/10/2006 Document Revised: 10/11/2015 Document Reviewed: 08/28/2014 Elsevier Interactive Patient Education  2018 [employer]. Vertigo Vertigo is the feeling that you or your surroundings are moving when they are not. Vertigo can be dangerous if it occurs while you are doing something that could endanger you or others, such as driving. What are the causes? This condition is caused by a disturbance in the signals that are sent by your body's sensory systems to your brain. Different causes of a disturbance can lead to vertigo, including:  Infections,  especially in the inner ear.  A bad reaction to a drug, or misuse of alcohol and medicines.  Withdrawal from drugs or alcohol.  Quickly changing positions, as when lying down or rolling over in bed.  Migraine headaches.  Decreased blood flow to the brain.  Decreased blood pressure.  Increased pressure in the brain from a head or neck injury, stroke, infection, tumor, or bleeding.  Central nervous system disorders.  What are the signs or symptoms? Symptoms of this condition usually occur when you move your head or your eyes in different directions. Symptoms may start suddenly, and they usually last for less than a minute. Symptoms may include:  Loss of balance and falling.  Feeling like you are spinning or moving.  Feeling like your surroundings are spinning or moving.  Nausea and vomiting.  Blurred vision or double vision.  Difficulty hearing.  Slurred speech.  Dizziness.  Involuntary eye movement (nystagmus).  Symptoms can be mild and cause only slight annoyance, or they can be severe and interfere with daily life. Episodes of vertigo may return (recur) over  time, and they are often triggered by certain movements. Symptoms may improve over time. How is this diagnosed? This condition may be diagnosed based on medical history and the quality of your nystagmus. Your health care provider may test your eye movements by asking you to quickly change positions to trigger the nystagmus. This may be called the Dix-Hallpike test, head thrust test, or roll test. You may be referred to a health care provider who specializes in ear, nose, and throat (ENT) problems (otolaryngologist) or a provider who specializes in disorders of the central nervous system (neurologist). You may have additional testing, including:  A physical exam.  Blood tests.  MRI.  A CT scan.  An electrocardiogram (ECG). This records electrical activity in your heart.  An electroencephalogram (EEG). This records electrical activity in your brain.  Hearing tests.  How is this treated? Treatment for this condition depends on the cause and the severity of the symptoms. Treatment options include:  Medicines to treat nausea or vertigo. These are usually used for severe cases. Some medicines that are used to treat other conditions may also reduce or eliminate vertigo symptoms. These include: ? Medicines that control allergies (antihistamines). ? Medicines that control seizures (anticonvulsants). ? Medicines that relieve depression (antidepressants). ? Medicines that relieve anxiety (sedatives).  Head movements to adjust your inner ear back to normal. If your vertigo is caused by an ear problem, your health care provider may recommend certain movements to correct the problem.  Surgery. This is rare.  Follow these instructions at home: Safety  Move slowly.Avoid sudden body or head movements.  Avoid driving.  Avoid operating heavy machinery.  Avoid doing any tasks that would cause danger to you or others if you would have a vertigo episode during the task.  If you have trouble  walking or keeping your balance, try using a cane for stability. If you feel dizzy or unstable, sit down right away.  Return to your normal activities as told by your health care provider. Ask your health care provider what activities are safe for you. General instructions  Take over-the-counter and prescription medicines only as told by your health care provider.  Avoid certain positions or movements as told by your health care provider.  Drink enough fluid to keep your urine clear or pale yellow.  Keep all follow-up visits as told by your health care provider. This is important. Contact a health  care provider if:  Your medicines do not relieve your vertigo or they make it worse.  You have a fever.  Your condition gets worse or you develop new symptoms.  Your family or friends notice any behavioral changes.  Your nausea or vomiting gets worse.  You have numbness or a "pins and needles" sensation in part of your body. Get help right away if:  You have difficulty moving or speaking.  You are always dizzy.  You faint.  You develop severe headaches.  You have weakness in your hands, arms, or legs.  You have changes in your hearing or vision.  You develop a stiff neck.  You develop sensitivity to light. This information is not intended to replace advice given to you by your health care provider. Make sure you discuss any questions you have with your health care provider. Document Released: 02/12/2005 Document Revised: 10/17/2015 Document Reviewed: 08/28/2014 Elsevier Interactive Patient Education  2018 [place]. Panosh M.D.

## 2017-09-03 ENCOUNTER — Ambulatory Visit: Payer: BC Managed Care – PPO | Admitting: Psychology

## 2017-09-03 DIAGNOSIS — F411 Generalized anxiety disorder: Secondary | ICD-10-CM

## 2017-09-14 ENCOUNTER — Other Ambulatory Visit: Payer: Self-pay | Admitting: Rheumatology

## 2017-09-15 NOTE — Telephone Encounter (Signed)
Last Visit: 06/01/17 Next visit: 11/10/17 Labs: 07/14/17 WNL PLQ Eye Exam: 06/25/17 WNL   Okay to refill per Dr. Estanislado Pandy

## 2017-09-17 ENCOUNTER — Ambulatory Visit: Payer: BC Managed Care – PPO | Admitting: Psychology

## 2017-09-17 DIAGNOSIS — F411 Generalized anxiety disorder: Secondary | ICD-10-CM | POA: Diagnosis not present

## 2017-09-23 ENCOUNTER — Ambulatory Visit (INDEPENDENT_AMBULATORY_CARE_PROVIDER_SITE_OTHER): Payer: BC Managed Care – PPO | Admitting: General Practice

## 2017-09-23 DIAGNOSIS — R760 Raised antibody titer: Secondary | ICD-10-CM

## 2017-09-23 DIAGNOSIS — Z7901 Long term (current) use of anticoagulants: Secondary | ICD-10-CM | POA: Diagnosis not present

## 2017-09-23 LAB — POCT INR: INR: 1.8

## 2017-09-23 NOTE — Patient Instructions (Addendum)
Pre visit review using our clinic review tool, if applicable. No additional management support is needed unless otherwise documented below in the visit note.  Take 2 tablets daily except take 1 tablet on Thursday/Sunday.  Re-check in 4 weeks.

## 2017-10-01 ENCOUNTER — Ambulatory Visit: Payer: BC Managed Care – PPO | Admitting: Psychology

## 2017-10-01 DIAGNOSIS — F411 Generalized anxiety disorder: Secondary | ICD-10-CM | POA: Diagnosis not present

## 2017-10-14 ENCOUNTER — Other Ambulatory Visit: Payer: Self-pay

## 2017-10-14 DIAGNOSIS — Z79899 Other long term (current) drug therapy: Secondary | ICD-10-CM

## 2017-10-14 DIAGNOSIS — Z8639 Personal history of other endocrine, nutritional and metabolic disease: Secondary | ICD-10-CM

## 2017-10-14 DIAGNOSIS — M0609 Rheumatoid arthritis without rheumatoid factor, multiple sites: Secondary | ICD-10-CM

## 2017-10-15 ENCOUNTER — Ambulatory Visit: Payer: BC Managed Care – PPO | Admitting: Psychology

## 2017-10-15 DIAGNOSIS — F411 Generalized anxiety disorder: Secondary | ICD-10-CM | POA: Diagnosis not present

## 2017-10-15 LAB — CBC WITH DIFFERENTIAL/PLATELET
Basophils Absolute: 47 cells/uL (ref 0–200)
Basophils Relative: 0.6 %
Eosinophils Absolute: 109 cells/uL (ref 15–500)
Eosinophils Relative: 1.4 %
HEMATOCRIT: 43.6 % (ref 35.0–45.0)
Hemoglobin: 14.5 g/dL (ref 11.7–15.5)
LYMPHS ABS: 2925 {cells}/uL (ref 850–3900)
MCH: 27.8 pg (ref 27.0–33.0)
MCHC: 33.3 g/dL (ref 32.0–36.0)
MCV: 83.7 fL (ref 80.0–100.0)
MPV: 12.1 fL (ref 7.5–12.5)
Monocytes Relative: 7.6 %
NEUTROS PCT: 52.9 %
Neutro Abs: 4126 cells/uL (ref 1500–7800)
PLATELETS: 259 10*3/uL (ref 140–400)
RBC: 5.21 10*6/uL — AB (ref 3.80–5.10)
RDW: 12.9 % (ref 11.0–15.0)
Total Lymphocyte: 37.5 %
WBC: 7.8 10*3/uL (ref 3.8–10.8)
WBCMIX: 593 {cells}/uL (ref 200–950)

## 2017-10-15 LAB — COMPLETE METABOLIC PANEL WITH GFR
AG Ratio: 1.4 (calc) (ref 1.0–2.5)
ALT: 15 U/L (ref 6–29)
AST: 17 U/L (ref 10–35)
Albumin: 4.1 g/dL (ref 3.6–5.1)
Alkaline phosphatase (APISO): 100 U/L (ref 33–130)
BUN: 10 mg/dL (ref 7–25)
CO2: 28 mmol/L (ref 20–32)
CREATININE: 0.85 mg/dL (ref 0.50–1.05)
Calcium: 9.4 mg/dL (ref 8.6–10.4)
Chloride: 106 mmol/L (ref 98–110)
GFR, Est African American: 87 mL/min/{1.73_m2} (ref >=60)
GFR, Est Non African American: 75 mL/min/{1.73_m2} (ref >=60)
GLUCOSE: 104 mg/dL — AB (ref 65–99)
Globulin: 3 g/dL (calc) (ref 1.9–3.7)
Potassium: 5 mmol/L (ref 3.5–5.3)
Sodium: 141 mmol/L (ref 135–146)
Total Bilirubin: 0.7 mg/dL (ref 0.2–1.2)
Total Protein: 7.1 g/dL (ref 6.1–8.1)

## 2017-10-15 LAB — CYCLIC CITRUL PEPTIDE ANTIBODY, IGG: Cyclic Citrullin Peptide Ab: >250 UNITS — ABNORMAL HIGH

## 2017-10-15 LAB — RHEUMATOID FACTOR: Rhuematoid fact SerPl-aCnc: 51 IU/mL — ABNORMAL HIGH (ref <14)

## 2017-10-15 LAB — VITAMIN D 25 HYDROXY (VIT D DEFICIENCY, FRACTURES): VIT D 25 HYDROXY: 42 ng/mL (ref 30–100)

## 2017-10-15 LAB — C-REACTIVE PROTEIN: CRP: 12.8 mg/L — AB (ref <8.0)

## 2017-10-15 NOTE — Progress Notes (Signed)
CRP is elevated rheumatoid factor is positive.  Patient is not having any flares and no change in treatment is required.

## 2017-10-19 ENCOUNTER — Other Ambulatory Visit: Payer: Self-pay | Admitting: Rheumatology

## 2017-10-19 ENCOUNTER — Other Ambulatory Visit: Payer: Self-pay | Admitting: Internal Medicine

## 2017-10-19 NOTE — Progress Notes (Signed)
Office Visit Note  Patient: [name]             Date of Birth: Jan 28, 1961           MRN: [id number]             PCP: Burnis Medin, MD Referring: Burnis Medin, MD Visit Date: 11/02/2017 Occupation: @GUAROCC @    Subjective:  Medication management.  History of Present Illness: [name] is a 57 y.o. female with history of sero positive rheumatoid arthritis.  She has been having increased pain and redness in her right lower extremity.  She also has some paresthesias in that area.  She states she has some cold sensation.  Denies any lower back pain.  None of the joints or swelling.  Been off prednisone for more than 1 year now.  Activities of Daily Living:  Patient reports morning stiffness for 0 minute.   Patient Denies nocturnal pain.  Difficulty dressing/grooming: Denies Difficulty climbing stairs: Denies Difficulty getting out of chair: Denies Difficulty using hands for taps, buttons, cutlery, and/or writing: Denies   Review of Systems  Constitutional: Positive for fatigue. Negative for weight gain and weight loss.  HENT: Positive for mouth dryness. Negative for mouth sores, trouble swallowing, trouble swallowing and nose dryness.   Eyes: Positive for dryness. Negative for pain, redness and visual disturbance.  Respiratory: Negative for cough, shortness of breath and difficulty breathing.   Cardiovascular: Negative for chest pain, palpitations, hypertension, irregular heartbeat and swelling in legs/feet.  Gastrointestinal: Negative for blood in stool, constipation and diarrhea.  Endocrine: Negative for increased urination.  Genitourinary: Negative for vaginal dryness.  Musculoskeletal: Negative for arthralgias, joint pain, joint swelling, myalgias, muscle weakness, morning stiffness, muscle tenderness and myalgias.  Skin: Negative for color change, rash, hair loss, skin tightness, ulcers and sensitivity to sunlight.  Allergic/Immunologic: Negative for  susceptible to infections.  Neurological: Positive for parasthesias. Negative for dizziness, memory loss and weakness.  Hematological: Negative for swollen glands.  Psychiatric/Behavioral: Negative for depressed mood and sleep disturbance. The patient is nervous/anxious.     PMFS History:  Patient Active Problem List   Diagnosis Date Noted  . Long term (current) use of anticoagulants 02/11/2017  . Sicca syndrome ([place]) 12/22/2016  . ANA positive 12/22/2016  . 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 Migraine 03/31/2016  . Anxiety 03/31/2016  . High risk medication use 03/31/2016  . Primary osteoarthritis of both feet 03/31/2016  . Primary osteoarthritis of both knees 03/31/2016  . Stiffness of left hand joint 11/27/2014  . Elevated IOP 06/09/2014  . Long term current use of systemic steroids 06/09/2014  . Medically complex patient 06/09/2014  . Current use of steroid medication 01/09/2014  . Xerosis of skin 12/07/2013  . Encounter for therapeutic drug monitoring 06/09/2013  . B12 deficiency 05/31/2013  . Iron deficiency 05/31/2013  . Visit for preventive health examination 05/25/2012  . Mammogram abnormal 04/12/2012  . 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 Vaginal atrophy 01/23/2012  . History of recurrent UTI (urinary tract infection) 01/23/2012  . Osteoporosis 04/29/2011  . Nonspecific abnormal results of liver function study 12/08/2010  . Chronic anticoagulation   . Anticoagulant long-term use 07/15/2010  . 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 Chronic cough 03/19/2009  . CHEST PAIN-UNSPECIFIED 08/26/2008  . PALPITATIONS, RECURRENT 07/24/2008  . Vitamin D deficiency 05/30/2008  . HYPERLIPIDEMIA 05/30/2008  . IRON DEFICIENCY 05/30/2008  . Adjustment disorder with anxiety 05/30/2008  . Rheumatoid arthritis ([place]) 05/30/2008  .  PULMONARY EMBOLISM, HX OF 05/30/2008    Past Medical History:  Diagnosis Date  . Adjustment disorder with anxiety   . B12  deficiency due to diet    is a vegetarian  . Chest pain, unspecified   . Chronic anticoagulation   . Headache(784.0)    migraines and head pain  . Hx of varicella    As Child  . Iron deficiency anemia, unspecified   . Otalgia, unspecified   . Other and unspecified hyperlipidemia   . Palpitations   . Personal history of venous thrombosis and embolism   . Recurrent UTI   . Rheumatoid arthritis(714.0)    deveshewar  . Unspecified vitamin D deficiency   . UTI (lower urinary tract infection) 11/28/2011   citrobacter       Family History  Problem Relation Age of Onset  . Dementia Father   . Stroke Father   . Hyperlipidemia Father   . Diabetes Father   . Parkinsonism Mother   . Lymphoma Unknown   . Colon cancer Neg Hx    Past Surgical History:  Procedure Laterality Date  . ORIF ULNAR FRACTURE    . TYMPANOSTOMY TUBE PLACEMENT  2009   Right   Social History   Social History Narrative   Regular Exercise-no   20 yrs in [place]   Son at [employer]   From [place].   Married HH  of 3    G1P1              Objective: Vital Signs: BP 100/68 (BP Location: Right Arm, Patient Position: Sitting, Cuff Size: Normal)   Pulse (!) 56   Resp 15   Ht 5' 3.5" (1.613 m)   Wt 169 lb (76.7 kg)   BMI 29.47 kg/m    Physical Exam  Constitutional: She is oriented to person, place, and time. She appears well-developed and well-nourished.  HENT:  Head: Normocephalic and atraumatic.  Eyes: Conjunctivae and EOM are normal.  Neck: Normal range of motion.  Cardiovascular: Normal rate, regular rhythm, normal heart sounds and intact distal pulses.  Pulmonary/Chest: Effort normal and breath sounds normal.  Abdominal: Soft. Bowel sounds are normal.  Lymphadenopathy:    She has no cervical adenopathy.  Neurological: She is alert and oriented to person, place, and time.  Skin: Skin is warm and dry. Capillary refill takes less than 2 seconds.  Psychiatric: She has a normal mood and affect. Her behavior  is normal.  Nursing note and vitals reviewed.    Musculoskeletal Exam: C-spine thoracic lumbar spine good range of motion.  Shoulder joints elbow joints wrist joint MCPs PIPs DIPs were in good range of motion with no synovitis.  Hip joints knee joints ankles MTPs PIPs been good range of motion with no synovitis.  CDAI Exam: CDAI Homunculus Exam:   Joint Counts:  CDAI Tender Joint count: 0 CDAI Swollen Joint count: 0  Global Assessments:  Patient Global Assessment: 1 Provider Global Assessment: 1  CDAI Calculated Score: 2    Investigation: No additional findings.PLQ eye exam: 06/25/2017 CBC Latest Ref Rng & Units 10/14/2017 07/14/2017 05/01/2017  WBC 3.8 - 10.8 Thousand/uL 7.8 7.0 6.5  Hemoglobin 11.7 - 15.5 g/dL 14.5 14.2 14.6  Hematocrit 35.0 - 45.0 % 43.6 43.5 43.6  Platelets 140 - 400 Thousand/uL 259 269.0 270   CMP Latest Ref Rng & Units 10/14/2017 07/14/2017 05/01/2017  Glucose 65 - 99 mg/dL 104(H) 96 98  BUN 7 - 25 mg/dL 10 10 8   Creatinine 0.50 -  1.05 mg/dL 0.85 0.79 0.73  Sodium 135 - 146 mmol/L 141 139 141  Potassium 3.5 - 5.3 mmol/L 5.0 4.3 4.9  Chloride 98 - 110 mmol/L 106 104 107  CO2 20 - 32 mmol/L 28 30 28   Calcium 8.6 - 10.4 mg/dL 9.4 9.6 9.2  Total Protein 6.1 - 8.1 g/dL 7.1 7.1 6.9  Total Bilirubin 0.2 - 1.2 mg/dL 0.7 0.7 0.8  Alkaline Phos 39 - 117 U/L - 91 -  AST 10 - 35 U/L 17 19 20   ALT 6 - 29 U/L 15 20 16     Imaging: No results found.  Speciality Comments: PLQ Eye Exam: 06/25/17 WNL @ [facility] Follow up 1 year    Procedures:  No procedures performed Allergies: Boniva [ibandronate sodium]; Ivp dye [iodinated diagnostic agents]; Other; and Risedronate sodium [risedronate sodium]   Assessment / Plan:     Visit Diagnoses: Rheumatoid arthritis of multiple sites with negative rheumatoid factor (HCC) - RF negative, anti-CCP positive, ANA positive.  Patient has no synovitis on examination.  Her rheumatoid arthritis is very well  controlled.  High risk medication use - Plaquenil 200 mg by mouth daily and Methotrexate 8 tablets by mouth every week, folic acid 2 mg by mouth daily. eye exam: 06/25/2017  Primary osteoarthritis of both knees-she has intermittent discomfort which is tolerable.  Primary osteoarthritis of both feet-proper fitting shoes were discussed.  Sicca syndrome (HCC)-she has been using over-the-counter products which have been helpful.  Age related osteoporosis, unspecified pathological fracture presence - Fosamax 70 mg po q week (discontinued due to reflux in May 2018), DEXA July 2018 left femur BMD 0.736 with the change of -0.3% T score of -2.5.  She will need repeat DEXA scan in July 2020.  Paresthesias right lower extremity-she has some cold sensation in her right lower extremity.  She has seen Dr. Brett Fairy in the past.  We will refer her to Dr. Brett Fairy.  She also complains of intermittent dizziness.  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  History of anemia  History of pulmonary embolism - anticoagulated   History of hyperlipidemia  History of migraine    Orders: Orders Placed This Encounter  Procedures  . Ambulatory referral to Neurology   No orders of the defined types were placed in this encounter.   Face-to-face time spent with patient was 30 minutes. >50% of time was spent in counseling and coordination of care.  Follow-Up Instructions: Return in about 5 months (around 04/04/2018) for Rheumatoid arthritis, Osteoarthritis.   Bo Merino, MD  Note - This record has been created using Editor, commissioning.  Chart creation errors have been sought, but may not always  have been located. Such creation errors do not reflect on  the standard of medical care.

## 2017-10-19 NOTE — Telephone Encounter (Signed)
Last Visit: 06/01/17 Next visit: 11/10/17 Labs: 10/14/17 cbc/cmp wnl  Okay to refill per Dr. Estanislado Pandy

## 2017-10-21 ENCOUNTER — Other Ambulatory Visit: Payer: Self-pay | Admitting: General Practice

## 2017-10-21 ENCOUNTER — Ambulatory Visit (INDEPENDENT_AMBULATORY_CARE_PROVIDER_SITE_OTHER): Payer: BC Managed Care – PPO | Admitting: General Practice

## 2017-10-21 DIAGNOSIS — Z7901 Long term (current) use of anticoagulants: Secondary | ICD-10-CM | POA: Diagnosis not present

## 2017-10-21 DIAGNOSIS — R760 Raised antibody titer: Secondary | ICD-10-CM

## 2017-10-21 LAB — POCT INR: INR: 1.7 — AB (ref 2.0–3.0)

## 2017-10-21 MED ORDER — COUMADIN 2.5 MG PO TABS: ORAL_TABLET | ORAL | 1 refills | Status: DC

## 2017-10-21 NOTE — Patient Instructions (Addendum)
Pre visit review using our clinic review tool, if applicable. No additional management support is needed unless otherwise documented below in the visit note.  Take 2 tablets daily except take 1 tablet on Thursday/Sunday.  Re-check in 4 weeks.

## 2017-10-22 ENCOUNTER — Telehealth: Payer: Self-pay | Admitting: Rheumatology

## 2017-10-22 NOTE — Telephone Encounter (Signed)
Patient left a voicemail stating she was returning your call.   

## 2017-10-22 NOTE — Telephone Encounter (Signed)
Patient advised of lab results and verbalized understanding.  

## 2017-11-02 ENCOUNTER — Ambulatory Visit: Payer: BC Managed Care – PPO | Admitting: Rheumatology

## 2017-11-02 ENCOUNTER — Encounter: Payer: Self-pay | Admitting: Rheumatology

## 2017-11-02 VITALS — BP 100/68 | HR 56 | Resp 15 | Ht 63.5 in | Wt 169.0 lb

## 2017-11-02 DIAGNOSIS — R202 Paresthesia of skin: Secondary | ICD-10-CM

## 2017-11-02 DIAGNOSIS — M19071 Primary osteoarthritis, right ankle and foot: Secondary | ICD-10-CM | POA: Diagnosis not present

## 2017-11-02 DIAGNOSIS — Z8669 Personal history of other diseases of the nervous system and sense organs: Secondary | ICD-10-CM

## 2017-11-02 DIAGNOSIS — M19072 Primary osteoarthritis, left ankle and foot: Secondary | ICD-10-CM

## 2017-11-02 DIAGNOSIS — Z862 Personal history of diseases of the blood and blood-forming organs and certain disorders involving the immune mechanism: Secondary | ICD-10-CM

## 2017-11-02 DIAGNOSIS — H40053 Ocular hypertension, bilateral: Secondary | ICD-10-CM | POA: Diagnosis not present

## 2017-11-02 DIAGNOSIS — M3500 Sicca syndrome, unspecified: Secondary | ICD-10-CM

## 2017-11-02 DIAGNOSIS — Z79899 Other long term (current) drug therapy: Secondary | ICD-10-CM

## 2017-11-02 DIAGNOSIS — M170 Bilateral primary osteoarthritis of knee: Secondary | ICD-10-CM | POA: Diagnosis not present

## 2017-11-02 DIAGNOSIS — M810 Age-related osteoporosis without current pathological fracture: Secondary | ICD-10-CM

## 2017-11-02 DIAGNOSIS — Z8639 Personal history of other endocrine, nutritional and metabolic disease: Secondary | ICD-10-CM

## 2017-11-02 DIAGNOSIS — M0609 Rheumatoid arthritis without rheumatoid factor, multiple sites: Secondary | ICD-10-CM | POA: Diagnosis not present

## 2017-11-02 DIAGNOSIS — Z86711 Personal history of pulmonary embolism: Secondary | ICD-10-CM

## 2017-11-02 NOTE — Patient Instructions (Signed)
Standing Labs We placed an order today for your standing lab work.    Please come back and get your standing labs in August and every 3 months   We have open lab Monday through Friday from 8:30-11:30 AM and 1:30-4:00 PM  at the office of Dr. Zorana Brockwell.   You may experience shorter wait times on Monday and Friday afternoons. The office is located at [address], [address], [place],  [address] No appointment is necessary.   Labs are drawn by [facility].  You may receive a bill from [facility] for your lab work. If you have any questions regarding directions or hours of operation,  please call [phone].    

## 2017-11-04 ENCOUNTER — Other Ambulatory Visit: Payer: Self-pay | Admitting: Rheumatology

## 2017-11-04 NOTE — Telephone Encounter (Signed)
Last visit: 11/02/17 Next Visit: 04/07/18  Okay to refill per Dr. Estanislado Pandy

## 2017-11-10 ENCOUNTER — Ambulatory Visit: Payer: BC Managed Care – PPO | Admitting: Rheumatology

## 2017-11-12 ENCOUNTER — Ambulatory Visit (INDEPENDENT_AMBULATORY_CARE_PROVIDER_SITE_OTHER): Payer: BC Managed Care – PPO | Admitting: Psychology

## 2017-11-12 DIAGNOSIS — F411 Generalized anxiety disorder: Secondary | ICD-10-CM | POA: Diagnosis not present

## 2017-11-18 ENCOUNTER — Ambulatory Visit (INDEPENDENT_AMBULATORY_CARE_PROVIDER_SITE_OTHER): Payer: BC Managed Care – PPO | Admitting: General Practice

## 2017-11-18 DIAGNOSIS — Z7901 Long term (current) use of anticoagulants: Secondary | ICD-10-CM

## 2017-11-18 DIAGNOSIS — R760 Raised antibody titer: Secondary | ICD-10-CM

## 2017-11-18 LAB — POCT INR: INR: 1.5 — AB (ref 2.0–3.0)

## 2017-11-18 NOTE — Patient Instructions (Addendum)
Pre visit review using our clinic review tool, if applicable. No additional management support is needed unless otherwise documented below in the visit note.  Continue to take 2 tablets daily except take 1 tablet on Thursday/Sunday.  Re-check in 4 weeks.  

## 2017-11-20 ENCOUNTER — Telehealth: Payer: Self-pay | Admitting: Rheumatology

## 2017-11-20 MED ORDER — PREDNISONE 1 MG PO TABS: 1.0000 mg | ORAL_TABLET | Freq: Every day | ORAL | 0 refills | Status: DC

## 2017-11-20 NOTE — Telephone Encounter (Signed)
Patient called stating she is having pain and swelling in her right wrist.  Patient is requesting a prescription of Prednisone 1 mg (approximately 15 pills) to be sent to [facility] on [address] today.  Patient states she is leaving for [facility] tomorrow morning and will be there for 2 weeks.  Patient states if the prescription cannot be called in today then she will call the office on Monday and let Dr. Estanislado Pandy know what pharmacy in [place] the prescription should be sent to.

## 2017-11-20 NOTE — Telephone Encounter (Signed)
Dr. Estanislado Pandy is ok with providing a prescription of Prednisone.  She approves Prednisone 1 mg tablets by mouth daily.  Dispense 30 tablets. Please advise patient to call if she develops increased joint pain and joint swelling after she completes the prednisone.

## 2017-11-20 NOTE — Telephone Encounter (Signed)
Prescription sent to the pharmacy. Patient advised  to call if she develops increased joint pain and joint swelling after she completes the prednisone.

## 2017-11-25 ENCOUNTER — Ambulatory Visit: Payer: Self-pay | Admitting: *Deleted

## 2017-11-25 MED ORDER — CIPROFLOXACIN HCL 500 MG PO TABS: 500.0000 mg | ORAL_TABLET | Freq: Two times a day (BID) | ORAL | 0 refills | Status: DC

## 2017-11-25 NOTE — Telephone Encounter (Signed)
Patient called with pain with voiding and frequency that began during the night. She denies any other symptoms at this time. She stated she has a history of uti's and is out of town for several more days. Requesting an antibiotic to be phoned in to  [facility] [phone] [address], [place] Reviewed other symptoms which would warrant immediate treatment at a local urgent care.  Please phone her cell to inform her if medication will or will not be phoned in, please.    Answer Assessment - Initial Assessment Questions 1. SYMPTOM: "What's the main symptom you're concerned about?" (e.g., frequency, incontinence)     Pain with voiding and frequency 2. ONSET: "When did the    start?"     Pain and frequency started last night 3. PAIN: "Is there any pain?" If so, ask: "How bad is it?" (Scale: 1-10; mild, moderate, severe)     2-3 4. CAUSE: "What do you think is causing the symptoms?"     uti 5. OTHER SYMPTOMS: "Do you have any other symptoms?" (e.g., fever, flank pain, blood in urine, pain with urination)     Only pain and frequency with voiding at this time.  6. PREGNANCY: "Is there any chance you are pregnant?" "When was your last menstrual period?"     no  Protocols used: URINARY [facility]

## 2017-11-25 NOTE — Telephone Encounter (Signed)
I called in script to number requested and spoke with pt.

## 2017-11-25 NOTE — Addendum Note (Signed)
Addended by: Aggie Hacker A on: 11/25/2017 05:16 PM   Modules accepted: Orders

## 2017-11-25 NOTE — Telephone Encounter (Signed)
Please send in cipro 500 1    Po bid for 3 days   Disp 6  If not better needs  Evaluation

## 2017-11-25 NOTE — Telephone Encounter (Signed)
Please advise Dr Panosh, thanks.   

## 2017-11-25 NOTE — Telephone Encounter (Signed)
Pt states that 3 days of Cipro never works for her -- needs 5-7 days of an abx Please advise Dr Regis Bill, thanks.

## 2017-11-25 NOTE — Telephone Encounter (Signed)
Ok to send in cipro 500 bid for 5 days disp 10  Need to get her pharmacy to send it to

## 2017-11-26 ENCOUNTER — Ambulatory Visit: Payer: BC Managed Care – PPO | Admitting: Psychology

## 2017-12-02 ENCOUNTER — Ambulatory Visit (INDEPENDENT_AMBULATORY_CARE_PROVIDER_SITE_OTHER): Payer: BC Managed Care – PPO | Admitting: Psychology

## 2017-12-02 DIAGNOSIS — F411 Generalized anxiety disorder: Secondary | ICD-10-CM | POA: Diagnosis not present

## 2017-12-10 ENCOUNTER — Ambulatory Visit: Payer: BC Managed Care – PPO | Admitting: Psychology

## 2017-12-14 ENCOUNTER — Other Ambulatory Visit: Payer: Self-pay | Admitting: Rheumatology

## 2017-12-14 NOTE — Telephone Encounter (Signed)
Last visit: 11/02/17 Next Visit: 04/07/18 Labs: 10/14/17 cbc/cmp wnl PLQ Eye Exam: 06/25/17 WNL   Okay to refill per Dr. Estanislado Pandy

## 2017-12-16 ENCOUNTER — Ambulatory Visit (INDEPENDENT_AMBULATORY_CARE_PROVIDER_SITE_OTHER): Payer: BC Managed Care – PPO | Admitting: General Practice

## 2017-12-16 DIAGNOSIS — Z7901 Long term (current) use of anticoagulants: Secondary | ICD-10-CM

## 2017-12-16 DIAGNOSIS — R760 Raised antibody titer: Secondary | ICD-10-CM

## 2017-12-16 LAB — POCT INR: INR: 1.6 — AB (ref 2.0–3.0)

## 2017-12-16 NOTE — Patient Instructions (Signed)
Pre visit review using our clinic review tool, if applicable. No additional management support is needed unless otherwise documented below in the visit note.  Continue to take 2 tablets daily except take 1 tablet on Thursday/Sunday.  Re-check in 4 weeks.  

## 2017-12-24 ENCOUNTER — Ambulatory Visit: Payer: BC Managed Care – PPO | Admitting: Psychology

## 2018-01-06 ENCOUNTER — Encounter: Payer: Self-pay | Admitting: Neurology

## 2018-01-06 ENCOUNTER — Ambulatory Visit: Payer: BC Managed Care – PPO | Admitting: Neurology

## 2018-01-06 VITALS — BP 106/73 | HR 58 | Ht 63.0 in | Wt 166.0 lb

## 2018-01-06 DIAGNOSIS — M3502 Sicca syndrome with lung involvement: Secondary | ICD-10-CM | POA: Diagnosis not present

## 2018-01-06 DIAGNOSIS — Z86711 Personal history of pulmonary embolism: Secondary | ICD-10-CM | POA: Diagnosis not present

## 2018-01-06 DIAGNOSIS — H8111 Benign paroxysmal vertigo, right ear: Secondary | ICD-10-CM

## 2018-01-06 NOTE — Progress Notes (Signed)
[facility]   Provider:  Larey Seat, [place] D  Primary Care Physician:  Burnis Medin, MD   Referring Provider: Burnis Medin, MD formerly with Dr. Jake Michaelis, MD   Chief Complaint  Patient presents with  . New Patient (Initial Visit)    pt alone, rm 10. pt states that she saw Dr Brett Fairy in 2011. few months ago she woke up in am and had a lot of dizziness and was unable to get out of the bed. started to vomitting. in the past she has had dizziness before with fast movement. she states that she notices this happening with quick movements. pt states that her feet bilateral are cold even in middle of summer, slight numbness right more then left    HPI:  [name] is a 57 y.o. female patient of [place] descent , seen here as in a referral from Dr. Regis Bill for a vertigo evaluation.   Chief complaint according to patient : " Over the last few months I have felt exhausted, tired, I often feel I want to take a nap but I do not."  Pt. stated she saw Dr Brett Fairy in 2011. A few months ago (April 10 th 2019) she woke up in and had severe dizziness and was unable to get out of bed, she started  Vomiting.  In the past she has had dizziness  associated with fast movement. She states she notices this happening with quick movements. Also reports her feet bilateral are cold  in the middle of Summer, slight numbness reportedly right more then left foot.     Medical history and family history: Mr. Delton Coombes was diagnosed approximately 8 years ago with ANA a positive rheumatoid arthritis, sicca syndrome, and also has migraines.  Her migraines have improved over the last years, she responds well to Imitrex.  She also has had a history of raised intraocular pressure, her rheumatoid condition has led to stiffness of several joints, but she had great  improved on methotrexate and Plaquenil.  Steroids let to cataract formation.  She used to take 0.25 mg xanax for sleep several years but weaned  off 18 month ago.   Her not laboratory panel was obtained through Dr. Rubbie Battiest; white blood cell count 7.8 thousand, RBC 5.2 million, hemoglobin and hematocrit and normal levels, platelet count 259,000, no abnormality on the differential.  BUN was 10 mg/dL and speaks for a well-hydrated patient, glucose was slightly elevated but she may have not been fasting at the time 104 mg/dL.  Creatinine was 0.85 mg/dL normal.  Glomerular filtration rate is supposed to be 75 mg/min normal sodium, potassium, chloride albumin AST and ALT.  C-reactive protein was elevated at  12.8.   Vitamin D level in the low normal range, rheumatoid factor 51 international unit/mL which is strongly positive.  Cyclic citrulline peptide antibody over 250 units= this is very strongly positive. The patient remains anticoagulated after she developed a pulmonary embolism.  This occurred in 2006.   Social history: married , 59 year old son lives in [place], Education officer, community , Therapist, occupational. Non smoker, non drinker, caffeine- tea , 2 cups a day.    Review of Systems: Out of a complete 14 system review, the patient complains of only the following symptoms, and all other reviewed systems are negative.   Vertigo, Epworth score  12 , Fatigue severity score 40-63   , depression score n/ a    Social History   Socioeconomic History  . Marital status:  Married    Spouse name: Not on file  . Number of children: 1  . Years of education: Not on file  . Highest education level: Not on file  Occupational History  . Occupation: home maker  Social Needs  . Financial resource strain: Not on file  . Food insecurity:    Worry: Not on file    Inability: Not on file  . Transportation needs:    Medical: Not on file    Non-medical: Not on file  Tobacco Use  . Smoking status: Never Smoker  . Smokeless tobacco: Never Used  Substance and Sexual Activity  . Alcohol use: No  . Drug use: Never  . Sexual activity: Not on file  Lifestyle   . Physical activity:    Days per week: Not on file    Minutes per session: Not on file  . Stress: Not on file  Relationships  . Social connections:    Talks on phone: Not on file    Gets together: Not on file    Attends religious service: Not on file    Active member of club or organization: Not on file    Attends meetings of clubs or organizations: Not on file    Relationship status: Not on file  . Intimate partner violence:    Fear of current or ex partner: Not on file    Emotionally abused: Not on file    Physically abused: Not on file    Forced sexual activity: Not on file  Other Topics Concern  . Not on file  Social History Narrative   Regular Exercise-no   20 yrs in [place]   Son at [facility]   From [place].   Married HH  of 3    G1P1             Family History  Problem Relation Age of Onset  . Dementia Father   . Stroke Father   . Hyperlipidemia Father   . Diabetes Father   . Parkinsonism Mother   . Lymphoma Unknown   . Colon cancer Neg Hx     Past Medical History:  Diagnosis Date  . Adjustment disorder with anxiety   . B12 deficiency due to diet    is a vegetarian  . Chest pain, unspecified   . Chronic anticoagulation   . Headache(784.0)    migraines and head pain  . Hx of varicella    As Child  . Iron deficiency anemia, unspecified   . Otalgia, unspecified   . Other and unspecified hyperlipidemia   . Palpitations   . Personal history of venous thrombosis and embolism   . Recurrent UTI   . Rheumatoid arthritis(714.0)    deveshewar  . Unspecified vitamin D deficiency   . UTI (lower urinary tract infection) 11/28/2011   citrobacter       Past Surgical History:  Procedure Laterality Date  . ORIF ULNAR FRACTURE    . TYMPANOSTOMY TUBE PLACEMENT  2009   Right    Current Outpatient Medications  Medication Sig Dispense Refill  . Calcium Carb-Cholecalciferol (CALTRATE 600+D) 600-800 MG-UNIT TABS Take by mouth.    . Cholecalciferol (VITAMIN D3) 2000  UNITS TABS Take 1 tablet by mouth daily.    Marland Kitchen COUMADIN 2.5 MG tablet TAKE AS DIRECTED BY  COUMADIN  CLINIC 180 tablet 1  . cycloSPORINE (RESTASIS) 0.05 % ophthalmic emulsion Place 1 drop into both eyes daily.      . Eflornithine HCl (VANIQA) 13.9 % cream  Apply topically 2 (two) times daily with a meal.    . folic acid (FOLVITE) 1 MG tablet TAKE 2 TABLETS BY MOUTH ONCE DAILY. 180 tablet 3  . hydroxychloroquine (PLAQUENIL) 200 MG tablet TAKE 1 TABLET(200 MG) BY MOUTH DAILY 90 tablet 0  . methotrexate 2.5 MG tablet TAKE 8 TABLETS BY MOUTH ONCE A WEEK 96 tablet 0  . mineral/vitamin supplement (MULTIGEN) 70 MG TABS tablet TAKE 1 TABLET BY MOUTH ONCE DAILY 90 tablet 1  . NASCOBAL 500 MCG/0.1ML SOLN Place 0.01 mLs (50 mcg total) into the nose once a week. 12 Bottle 0  . SUMAtriptan (IMITREX) 50 MG tablet TAKE ONE TABLET BY MOUTH AS NEEDED. MAY REPEAT IN 2 TO 4 HOURS IF NEEDED. 9 tablet 5   No current facility-administered medications for this visit.     Allergies as of 01/06/2018 - Review Complete 11/02/2017  Allergen Reaction Noted  . Boniva [ibandronate sodium]  04/29/2011  . Ivp dye [iodinated diagnostic agents] Swelling 11/05/2010  . Other Swelling 10/08/2012  . Risedronate sodium [risedronate sodium] Other (See Comments) 05/01/2011    Vitals: BP 106/73   Pulse (!) 58   Ht 5\' 3"  (1.6 m)   Wt 166 lb (75.3 kg)   BMI 29.41 kg/m  Last Weight:  Wt Readings from Last 1 Encounters:  01/06/18 166 lb (75.3 kg)   UJW:JXBJ mass index is 29.41 kg/m.     Last Height:   Ht Readings from Last 1 Encounters:  01/06/18 5\' 3"  (1.6 m)    Physical exam:  General: The patient is awake, alert and appears not in acute distress. The patient is well groomed. Head: Normocephalic, atraumatic. Neck is supple. Mallampati 3   neck circumference: 15. Nasal airflow patent ,Retrognathia is not seen.  Cardiovascular:  Regular rate and rhythm , without  murmurs or carotid bruit, and without distended neck  veins. Respiratory: Lungs are clear to auscultation. Skin:  Without evidence of edema, or rash Trunk: BMI is 29. 4 - [name] posture is slightly stooped.   Neurologic exam : The patient is awake and alert, oriented to place and time.   Speech is fluent, with dysphonia .  Mood and affect are appropriate.  Cranial nerves: Pupils are equal and briskly reactive to light. Funduscopic exam without  evidence of pallor or edema, beginning cataract . Extraocular movements  in vertical and horizontal planes intact and without nystagmus- she feels not dizzy right now. Visual fields by finger perimetry are intact. Hearing to finger rub intact.  Facial sensation intact to fine touch. Facial motor strength is symmetric and tongue and uvula move midline. Shoulder shrug was symmetrical.   Motor exam:  Normal tone, muscle bulk and symmetric strength in all extremities. Sensory:  Fine touch, pinprick and vibration were tested in all extremities. Proprioception tested in the upper extremities was normal. Coordination: Rapid alternating movements in the fingers/hands was normal. Finger-to-nose maneuver  normal without evidence of ataxia, dysmetria or tremor. Gait and station: Patient walks without assistive device and is able unassisted to climb up to the exam table. Strength within normal limits.  Stance is stable and normal. Turns with 4  Steps. Romberg testing is negative. Deep tendon reflexes: in the  upper and lower extremities are symmetric and intact.     Assessment:  After physical and neurologic examination, review of laboratory studies,  Personal review of imaging studies, reports of other /same  Imaging studies, results of polysomnography and / or neurophysiology testing and pre-existing records as  far as provided in visit., my assessment is   1)  Benign positional Vertigo, worse with rapid movement to the left, fast component to the right.   2)  Coughing - hoarse voice and frequent throat  clearing -allergic response? Last years CT chest showed nodules. Had PE in the past.   3) Affirmation that vertigo is of benign origin- quell anxiety. Not any associated symptom that would indicate stroke origin.   4) Fatigue related to inflammation, auto immune disease- the sleep habits have not changed.    The patient was advised of the nature of the diagnosed disorder , the treatment options and the  risks for general health and wellness arising from not treating the condition.   I spent more than 45  minutes of face to face time with the patient.  Greater than 50% of time was spent in counseling and coordination of care. We have discussed the diagnosis and differential and I answered the patient's questions.    Plan:  Treatment plan and additional workup :  Vestibular rehab for Eppley/ Brent Daroff maneuver.   PCP to consider allergy testing for coughing , consider diff dx of GERD.   I will follow up prn.    Larey Seat, MD 4/49/7530, 0:51 PM  Certified in Neurology by ABPN Certified in [place] by [facility] [address], [place], [place] [address]

## 2018-01-06 NOTE — Patient Instructions (Signed)

## 2018-01-07 ENCOUNTER — Ambulatory Visit: Payer: BC Managed Care – PPO | Admitting: Psychology

## 2018-01-07 DIAGNOSIS — F411 Generalized anxiety disorder: Secondary | ICD-10-CM

## 2018-01-13 ENCOUNTER — Ambulatory Visit (INDEPENDENT_AMBULATORY_CARE_PROVIDER_SITE_OTHER): Payer: BC Managed Care – PPO | Admitting: General Practice

## 2018-01-13 ENCOUNTER — Telehealth: Payer: Self-pay | Admitting: Internal Medicine

## 2018-01-13 DIAGNOSIS — R918 Other nonspecific abnormal finding of lung field: Secondary | ICD-10-CM

## 2018-01-13 DIAGNOSIS — Z7901 Long term (current) use of anticoagulants: Secondary | ICD-10-CM | POA: Diagnosis not present

## 2018-01-13 DIAGNOSIS — R760 Raised antibody titer: Secondary | ICD-10-CM

## 2018-01-13 LAB — POCT INR: INR: 1.8 — AB (ref 2.0–3.0)

## 2018-01-13 NOTE — Telephone Encounter (Signed)
Called patient unable to reach left message to give us a call back.

## 2018-01-13 NOTE — Patient Instructions (Addendum)
Pre visit review using our clinic review tool, if applicable. No additional management support is needed unless otherwise documented below in the visit note.  Continue to take 2 tablets daily except take 1 tablet on Thursday/Sunday.  Re-check in 4 weeks.  

## 2018-01-14 NOTE — Telephone Encounter (Signed)
Spoke with [name]. She will be scheduling this CT. A new order was needed. This has been placed.

## 2018-01-14 NOTE — Telephone Encounter (Signed)
Pt had been scheduled for Sept but when I called her she wanted October I have called her and now she wants sept at [place] imaging appt scheduled for  01/19/18 arrive at 11:30am [address]

## 2018-01-14 NOTE — Telephone Encounter (Signed)
Pt aware of appt.

## 2018-01-14 NOTE — Telephone Encounter (Signed)
Pt is due for a CT in September. Will route message to [facility] to have this scheduled.

## 2018-01-16 ENCOUNTER — Other Ambulatory Visit: Payer: Self-pay | Admitting: Rheumatology

## 2018-01-19 ENCOUNTER — Other Ambulatory Visit: Payer: Self-pay

## 2018-01-19 ENCOUNTER — Ambulatory Visit
Admission: RE | Admit: 2018-01-19 | Discharge: 2018-01-19 | Disposition: A | Discharge status: Home / Self Care | Payer: BC Managed Care – PPO | Source: Ambulatory Visit | Attending: Internal Medicine | Admitting: Internal Medicine

## 2018-01-19 DIAGNOSIS — R918 Other nonspecific abnormal finding of lung field: Secondary | ICD-10-CM

## 2018-01-19 NOTE — Telephone Encounter (Addendum)
Last visit: 11/02/17 Next Visit: 04/07/18 Labs: 10/14/17 cbc/cmp wnl  Patient advised she is due for labs. patient plans to update this week depending upon the weather. At the very latest early next week.   Okay to refill 30 day supply per Dr. Estanislado Pandy

## 2018-01-21 ENCOUNTER — Telehealth: Payer: Self-pay | Admitting: Internal Medicine

## 2018-01-21 ENCOUNTER — Other Ambulatory Visit: Payer: Self-pay | Admitting: Internal Medicine

## 2018-01-21 ENCOUNTER — Ambulatory Visit: Payer: BC Managed Care – PPO | Admitting: Psychology

## 2018-01-21 DIAGNOSIS — F411 Generalized anxiety disorder: Secondary | ICD-10-CM | POA: Diagnosis not present

## 2018-01-21 NOTE — Telephone Encounter (Signed)
  Let her knoiw bold from below. Please give followup to discuss  Ct Chest Wo Contrast  Result Date: 01/19/2018 CLINICAL DATA:  Follow-up of pulmonary nodules.  Nonsmoker. EXAM: CT CHEST WITHOUT CONTRAST TECHNIQUE: Multidetector CT imaging of the chest was performed following the standard protocol without IV contrast. COMPARISON:  10/08/2016 FINDINGS: Cardiovascular: Tortuous thoracic aorta. Normal heart size, without pericardial effusion. Mediastinum/Nodes: Hypoattenuating right thyroid nodules are again identified and are nonspecific. No mediastinal or definite hilar adenopathy, given limitations of unenhanced CT. Lungs/Pleura: No pleural fluid. Left lower lobe scarring with a similar nodular component measuring on the order of 10 mm on image 101/8. Right-sided pulmonary nodules are unchanged, including a right upper lobe 5 mm nodule on image 59/8 and a 4 mm right middle lobe pulmonary nodule on image 78/8. Upper Abdomen: Normal imaged portions of the liver, spleen, stomach, pancreas, gallbladder, adrenal glands, kidneys. Musculoskeletal: No acute osseous abnormality. Remote manubrial fracture. IMPRESSION: 1. Right-sided pulmonary nodules, and an area of presumed left lower lobe scarring are similar compared to 10/08/2016. These can be presumed benign. 2.  No acute process in the chest. Electronically Signed   By: Abigail Miyamoto M.D.   On: 01/19/2018 14:43

## 2018-01-21 NOTE — Telephone Encounter (Signed)
Pt requesting CT chest results from 01/19/18.  MR please advise.  Thanks!

## 2018-01-22 NOTE — Telephone Encounter (Signed)
Spoke with pt. She is aware of results. Nothing further was needed.  

## 2018-01-26 ENCOUNTER — Ambulatory Visit: Payer: BC Managed Care – PPO | Admitting: Internal Medicine

## 2018-01-27 ENCOUNTER — Other Ambulatory Visit: Payer: Self-pay

## 2018-01-27 DIAGNOSIS — Z79899 Other long term (current) drug therapy: Secondary | ICD-10-CM

## 2018-01-28 LAB — CBC WITH DIFFERENTIAL/PLATELET
BASOS PCT: 0.8 %
Basophils Absolute: 64 cells/uL (ref 0–200)
EOS ABS: 120 {cells}/uL (ref 15–500)
Eosinophils Relative: 1.5 %
HEMATOCRIT: 44.9 % (ref 35.0–45.0)
HEMOGLOBIN: 15 g/dL (ref 11.7–15.5)
LYMPHS ABS: 2904 {cells}/uL (ref 850–3900)
MCH: 28.1 pg (ref 27.0–33.0)
MCHC: 33.4 g/dL (ref 32.0–36.0)
MCV: 84.1 fL (ref 80.0–100.0)
MONOS PCT: 7.2 %
MPV: 11.6 fL (ref 7.5–12.5)
NEUTROS ABS: 4336 {cells}/uL (ref 1500–7800)
Neutrophils Relative %: 54.2 %
Platelets: 284 10*3/uL (ref 140–400)
RBC: 5.34 10*6/uL — ABNORMAL HIGH (ref 3.80–5.10)
RDW: 12.9 % (ref 11.0–15.0)
Total Lymphocyte: 36.3 %
WBC mixed population: 576 cells/uL (ref 200–950)
WBC: 8 10*3/uL (ref 3.8–10.8)

## 2018-01-28 LAB — COMPLETE METABOLIC PANEL WITH GFR
AG Ratio: 1.4 (calc) (ref 1.0–2.5)
ALBUMIN MSPROF: 4.3 g/dL (ref 3.6–5.1)
ALKALINE PHOSPHATASE (APISO): 101 U/L (ref 33–130)
ALT: 13 U/L (ref 6–29)
AST: 17 U/L (ref 10–35)
BILIRUBIN TOTAL: 0.9 mg/dL (ref 0.2–1.2)
BUN: 9 mg/dL (ref 7–25)
CHLORIDE: 105 mmol/L (ref 98–110)
CO2: 26 mmol/L (ref 20–32)
Calcium: 9.8 mg/dL (ref 8.6–10.4)
Creat: 0.82 mg/dL (ref 0.50–1.05)
GFR, Est African American: 91 mL/min/{1.73_m2} (ref >=60)
GFR, Est Non African American: 78 mL/min/{1.73_m2} (ref >=60)
GLOBULIN: 3 g/dL (ref 1.9–3.7)
Glucose, Bld: 90 mg/dL (ref 65–99)
Potassium: 4.6 mmol/L (ref 3.5–5.3)
Sodium: 140 mmol/L (ref 135–146)
Total Protein: 7.3 g/dL (ref 6.1–8.1)

## 2018-02-04 ENCOUNTER — Ambulatory Visit: Payer: BC Managed Care – PPO | Admitting: Psychology

## 2018-02-04 DIAGNOSIS — F411 Generalized anxiety disorder: Secondary | ICD-10-CM

## 2018-02-10 ENCOUNTER — Ambulatory Visit (INDEPENDENT_AMBULATORY_CARE_PROVIDER_SITE_OTHER): Payer: BC Managed Care – PPO | Admitting: General Practice

## 2018-02-10 DIAGNOSIS — Z7901 Long term (current) use of anticoagulants: Secondary | ICD-10-CM | POA: Diagnosis not present

## 2018-02-10 DIAGNOSIS — R760 Raised antibody titer: Secondary | ICD-10-CM

## 2018-02-10 LAB — POCT INR: INR: 1.8 — AB (ref 2.0–3.0)

## 2018-02-10 NOTE — Patient Instructions (Addendum)
Pre visit review using our clinic review tool, if applicable. No additional management support is needed unless otherwise documented below in the visit note.  Continue to take 2 tablets daily except take 1 tablet on Thursday/Sunday.  Re-check in 4 weeks.  

## 2018-02-18 ENCOUNTER — Ambulatory Visit: Payer: BC Managed Care – PPO | Admitting: Psychology

## 2018-02-18 DIAGNOSIS — F411 Generalized anxiety disorder: Secondary | ICD-10-CM | POA: Diagnosis not present

## 2018-02-27 ENCOUNTER — Other Ambulatory Visit: Payer: Self-pay | Admitting: Rheumatology

## 2018-03-01 NOTE — Telephone Encounter (Signed)
Last visit: 11/02/17 Next Visit: 04/07/18 Labs: 01/27/18 CMP WNL. RBC stable. All other labs are WNL.  Okay to refill per Dr. Estanislado Pandy

## 2018-03-04 ENCOUNTER — Ambulatory Visit: Payer: BC Managed Care – PPO | Admitting: Psychology

## 2018-03-04 DIAGNOSIS — F411 Generalized anxiety disorder: Secondary | ICD-10-CM | POA: Diagnosis not present

## 2018-03-10 ENCOUNTER — Ambulatory Visit: Payer: Self-pay

## 2018-03-12 ENCOUNTER — Other Ambulatory Visit: Payer: Self-pay | Admitting: Rheumatology

## 2018-03-12 NOTE — Telephone Encounter (Signed)
Last visit: 11/02/17 Next Visit: 04/07/18 Labs: 01/27/18 CMP WNL. RBC stable. All other labs are WNL. PLQ Eye Exam: 06/25/17 WNL  Okay to refill per Dr. Estanislado Pandy

## 2018-03-15 ENCOUNTER — Ambulatory Visit (INDEPENDENT_AMBULATORY_CARE_PROVIDER_SITE_OTHER): Payer: BC Managed Care – PPO | Admitting: General Practice

## 2018-03-15 DIAGNOSIS — R760 Raised antibody titer: Secondary | ICD-10-CM

## 2018-03-15 DIAGNOSIS — Z7901 Long term (current) use of anticoagulants: Secondary | ICD-10-CM

## 2018-03-15 DIAGNOSIS — Z23 Encounter for immunization: Secondary | ICD-10-CM

## 2018-03-15 LAB — POCT INR: INR: 1.6 — AB (ref 2.0–3.0)

## 2018-03-15 NOTE — Patient Instructions (Addendum)
Pre visit review using our clinic review tool, if applicable. No additional management support is needed unless otherwise documented below in the visit note.  Continue to take 2 tablets daily except take 1 tablet on Thursday/Sunday.  Re-check in 4 weeks.  

## 2018-03-18 ENCOUNTER — Ambulatory Visit (INDEPENDENT_AMBULATORY_CARE_PROVIDER_SITE_OTHER): Payer: BC Managed Care – PPO | Admitting: Psychology

## 2018-03-18 DIAGNOSIS — F411 Generalized anxiety disorder: Secondary | ICD-10-CM | POA: Diagnosis not present

## 2018-03-26 NOTE — Progress Notes (Signed)
Office Visit Note  Patient: [name]             Date of Birth: [birth year]           MRN: [id number]             PCP: Burnis Medin, MD Referring: Burnis Medin, MD Visit Date: 04/07/2018 Occupation: @GUAROCC @  Subjective:  Medication monitoring    History of Present Illness: Kellen Hover is a 57 y.o. female with history of seropositive rheumatoid arthritis. She is on PLQ 200 mg 1 tablet by mouth daily and MTX 8 tablets by mouth once weekly.  She states she does not feel like her rheumatoid arthritis is worsening. She states she recently traveled and developed increased joint pain.  She took prednisone 1 mg 1 tablet by mouth for a few days, which resolved the pain. She is having some right knee pain.  She has some difficulty getting up from a chair.  She denies any joint swelling at this time. She states she occasionally experiences right hand numbness at night.   Activities of Daily Living:  Patient reports morning stiffness for 0  minutes.   Patient Reports nocturnal pain.  Difficulty dressing/grooming: Denies Difficulty climbing stairs: Denies Difficulty getting out of chair: Denies Difficulty using hands for taps, buttons, cutlery, and/or writing: Denies  Review of Systems  Constitutional: Positive for fatigue.  HENT: Negative for mouth sores, mouth dryness and nose dryness.   Eyes: Positive for dryness (restasis). Negative for pain and visual disturbance.  Respiratory: Positive for cough (Due to reflux). Negative for hemoptysis, shortness of breath and difficulty breathing.   Cardiovascular: Negative for chest pain, palpitations, hypertension and swelling in legs/feet.  Gastrointestinal: Negative for blood in stool, constipation and diarrhea.  Endocrine: Negative for increased urination.  Genitourinary: Negative for painful urination.  Musculoskeletal: Positive for arthralgias and joint pain. Negative for joint swelling, myalgias, muscle weakness, morning  stiffness, muscle tenderness and myalgias.  Skin: Negative for color change, pallor, rash, hair loss, nodules/bumps, skin tightness, ulcers and sensitivity to sunlight.  Allergic/Immunologic: Negative for susceptible to infections.  Neurological: Negative for dizziness, numbness, headaches and weakness.  Hematological: Negative for swollen glands.  Psychiatric/Behavioral: Negative for depressed mood and sleep disturbance. The patient is not nervous/anxious.     PMFS History:  Patient Active Problem List   Diagnosis Date Noted  . Vertigo, benign paroxysmal, right 01/06/2018  . 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[place]) 01/06/2018  . Long term (current) use of anticoagulants 02/11/2017  . Sicca syndrome ([place]) 12/22/2016  . ANA positive 12/22/2016  . 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 Migraine 03/31/2016  . Anxiety 03/31/2016  . High risk medication use 03/31/2016  . Primary osteoarthritis of both feet 03/31/2016  . Primary osteoarthritis of both knees 03/31/2016  . Stiffness of left hand joint 11/27/2014  . Elevated IOP 06/09/2014  . Long term current use of systemic steroids 06/09/2014  . Medically complex patient 06/09/2014  . Current use of steroid medication 01/09/2014  . Xerosis of skin 12/07/2013  . Encounter for therapeutic drug monitoring 06/09/2013  . B12 deficiency 05/31/2013  . Iron deficiency 05/31/2013  . Visit for preventive health examination 05/25/2012  . Mammogram abnormal 04/12/2012  . 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 Vaginal atrophy 01/23/2012  . History of recurrent UTI (urinary tract infection) 01/23/2012  . Osteoporosis 04/29/2011  . Nonspecific abnormal results of liver function study 12/08/2010  . Chronic anticoagulation   . Anticoagulant  long-term use 07/15/2010  . 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 Chronic cough 03/19/2009  . CHEST PAIN-UNSPECIFIED 08/26/2008  . PALPITATIONS, RECURRENT 07/24/2008  . Vitamin D  deficiency 05/30/2008  . HYPERLIPIDEMIA 05/30/2008  . IRON DEFICIENCY 05/30/2008  . Adjustment disorder with anxiety 05/30/2008  . Rheumatoid arthritis ([place]) 05/30/2008  . PULMONARY EMBOLISM, HX OF 05/30/2008    Past Medical History:  Diagnosis Date  . Adjustment disorder with anxiety   . B12 deficiency due to diet    is a vegetarian  . Chest pain, unspecified   . Chronic anticoagulation   . Headache(784.0)    migraines and head pain  . Hx of varicella    As Child  . Iron deficiency anemia, unspecified   . Otalgia, unspecified   . Other and unspecified hyperlipidemia   . Palpitations   . Personal history of venous thrombosis and embolism   . Recurrent UTI   . Rheumatoid arthritis(714.0)    deveshewar  . Unspecified vitamin D deficiency   . UTI (lower urinary tract infection) 11/28/2011   citrobacter       Family History  Problem Relation Age of Onset  . Dementia Father   . Stroke Father   . Hyperlipidemia Father   . Diabetes Father   . Parkinsonism Mother   . Lymphoma Unknown   . Colon cancer Neg Hx    Past Surgical History:  Procedure Laterality Date  . ORIF ULNAR FRACTURE    . TYMPANOSTOMY TUBE PLACEMENT  2009   Right   Social History   Social History Narrative   Regular Exercise-no   20 yrs in [place]   Son at [facility]   From [place].   Married HH  of 3    G1P1             Objective: Vital Signs: BP 97/65 (BP Location: Right Arm, Patient Position: Sitting, Cuff Size: Normal)   Pulse 63   Resp 14   Ht 5' 3.5" (1.613 m)   Wt 165 lb 6.4 oz (75 kg)   BMI 28.84 kg/m    Physical Exam  Constitutional: She is oriented to person, place, and time. She appears well-developed and well-nourished.  HENT:  Head: Normocephalic and atraumatic.  Eyes: Conjunctivae and EOM are normal.  Neck: Normal range of motion.  Cardiovascular: Normal rate, regular rhythm, normal heart sounds and intact distal pulses.  Pulmonary/Chest: Effort normal and breath sounds normal.    Abdominal: Soft. Bowel sounds are normal.  Lymphadenopathy:    She has no cervical adenopathy.  Neurological: She is alert and oriented to person, place, and time.  Skin: Skin is warm and dry. Capillary refill takes less than 2 seconds.  Psychiatric: She has a normal mood and affect. Her behavior is normal.  Nursing note and vitals reviewed.    Musculoskeletal Exam: C-spine, thoracic spine, and lumbar spine good ROM.  No midline spinal tenderness.  No SI joint tenderness.  Shoulder joints, elbow joints, wrist joints, MCPs, PIPs, and DIPs good ROM with no synovitis.  Hip joints, knee joints, ankle joints, MTPs, PIPs, and DIPs good ROM with no synovitis.  No warmth or effusion of knee joints.  No tenderness or swelling of ankle joints.  No tenderness of trochanteric bursa bilaterally.   CDAI Exam: CDAI Score: 0.5  Patient Global Assessment: 3 (mm); Provider Global Assessment: 2 (mm) Swollen: 0 ; Tender: 0  Joint Exam   Not documented   There is currently no information documented on the homunculus.  Go to the Rheumatology activity and complete the homunculus joint exam.  Investigation: No additional findings.  Imaging: No results found.  Recent Labs: Lab Results  Component Value Date   WBC 8.0 01/27/2018   HGB 15.0 01/27/2018   PLT 284 01/27/2018   NA 140 01/27/2018   K 4.6 01/27/2018   CL 105 01/27/2018   CO2 26 01/27/2018   GLUCOSE 90 01/27/2018   BUN 9 01/27/2018   CREATININE 0.82 01/27/2018   BILITOT 0.9 01/27/2018   ALKPHOS 91 07/14/2017   AST 17 01/27/2018   ALT 13 01/27/2018   PROT 7.3 01/27/2018   ALBUMIN 4.0 07/14/2017   CALCIUM 9.8 01/27/2018   GFRAA 91 01/27/2018    Speciality Comments: PLQ Eye Exam: 06/25/17 WNL @ [facility] Follow up 1 year  Procedures:  No procedures performed Allergies: Boniva [ibandronate sodium]; Ivp dye [iodinated diagnostic agents]; Other; and Risedronate sodium [risedronate sodium]     Assessment / Plan:     Visit  Diagnoses: Rheumatoid arthritis of multiple sites with negative rheumatoid factor (HCC) - RF negative, anti-CCP positive, ANA positive: She has no synovitis.  She has not had any recent flares.  She has intermittent arthralgias but no joint swelling or morning stiffness.  She is clinically doing well on PLQ 200 mg 1 tablet by mouth daily, MTX 8 tablets by mouth once weekly, and folic acid 2 mg by mouth daily. She will continue on this current treatment regimen.  She was advised to notify us if she develops increased joint pain or joint swelling. She will follow up in 5 months.   High risk medication use - Plaquenil 200 mg by mouth daily and Methotrexate 8 tablets by mouth every week, folic acid 2 mg by mouth daily.eye exam: 06/25/2017.  CBC and CMP were drawn on 01/27/18.  She will return in December and every 3 months to monitor for drug toxicity.   Primary osteoarthritis of both knees: No warmth or effusion. Good ROM with no discomfort.  She has some difficulty getting up from a kneeling position. She has no difficulty getting up from a chair or going up and down steps.   Primary osteoarthritis of both feet: She has no discomfort in her feet at this time.  She wears proper fitting shoes.  Sicca syndrome [facility]): She continues to have eye dryness.  She uses Restasis and OTC eye drops several times daily.   Age related osteoporosis, unspecified pathological fracture presence - She discontinued fosamax 70 mg po q week in May 2018 due to worsening reflux, DEXA July 2018 left femur BMD 0.736 with the change of -0.3% T score of -2.5.  She takes a calcium and vitamin D supplement daily. Vitamin D was 42 on 10/14/17.   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  History of migraine  History of pulmonary embolism - She is on Warfarin   History of anemia  History of hyperlipidemia   Orders: No orders of the defined types  were placed in this encounter.  No orders of the defined types were placed in this encounter.     Follow-Up Instructions: Return in about 5 months (around 09/06/2018).   Ofilia Neas, PA-C   I examined and evaluated the patient with Hazel Sams PA.  Patient has history of intermittent joint pain and joint swelling.  She had no synovitis on my examination.  She has some synovial thickening.  She will continue current medications for right now.  She has been taking intermittent low-dose prednisone.  The plan of care was discussed as noted above.  Bo Merino, MD  Note - This record has been created using Editor, commissioning.  Chart creation errors have been sought, but may not always  have been located. Such creation errors do not reflect on  the standard of medical care.

## 2018-04-01 ENCOUNTER — Ambulatory Visit: Payer: BC Managed Care – PPO | Admitting: Psychology

## 2018-04-01 DIAGNOSIS — F411 Generalized anxiety disorder: Secondary | ICD-10-CM

## 2018-04-07 ENCOUNTER — Encounter: Payer: Self-pay | Admitting: Rheumatology

## 2018-04-07 ENCOUNTER — Ambulatory Visit: Payer: BC Managed Care – PPO | Admitting: Rheumatology

## 2018-04-07 VITALS — BP 97/65 | HR 63 | Resp 14 | Ht 63.5 in | Wt 165.4 lb

## 2018-04-07 DIAGNOSIS — M810 Age-related osteoporosis without current pathological fracture: Secondary | ICD-10-CM

## 2018-04-07 DIAGNOSIS — Z79899 Other long term (current) drug therapy: Secondary | ICD-10-CM

## 2018-04-07 DIAGNOSIS — M0609 Rheumatoid arthritis without rheumatoid factor, multiple sites: Secondary | ICD-10-CM

## 2018-04-07 DIAGNOSIS — M3500 Sicca syndrome, unspecified: Secondary | ICD-10-CM

## 2018-04-07 DIAGNOSIS — M19071 Primary osteoarthritis, right ankle and foot: Secondary | ICD-10-CM

## 2018-04-07 DIAGNOSIS — H40053 Ocular hypertension, bilateral: Secondary | ICD-10-CM

## 2018-04-07 DIAGNOSIS — Z8669 Personal history of other diseases of the nervous system and sense organs: Secondary | ICD-10-CM

## 2018-04-07 DIAGNOSIS — Z862 Personal history of diseases of the blood and blood-forming organs and certain disorders involving the immune mechanism: Secondary | ICD-10-CM

## 2018-04-07 DIAGNOSIS — Z8639 Personal history of other endocrine, nutritional and metabolic disease: Secondary | ICD-10-CM

## 2018-04-07 DIAGNOSIS — M19072 Primary osteoarthritis, left ankle and foot: Secondary | ICD-10-CM

## 2018-04-07 DIAGNOSIS — M170 Bilateral primary osteoarthritis of knee: Secondary | ICD-10-CM

## 2018-04-07 DIAGNOSIS — Z86711 Personal history of pulmonary embolism: Secondary | ICD-10-CM

## 2018-04-07 NOTE — Patient Instructions (Signed)
Standing Labs We placed an order today for your standing lab work.    Please come back and get your standing labs in December and every 3 months   We have open lab Monday through Friday from 8:30-11:30 AM and 1:30-4:00 PM  at the office of Dr. Shaili Deveshwar.   You may experience shorter wait times on Monday and Friday afternoons. The office is located at [address], [address], [place], [place] [address] No appointment is necessary.   Labs are drawn by [facility].  You may receive a bill from [facility] for your lab work. If you have any questions regarding directions or hours of operation,  please call [phone].   Just as a reminder please drink plenty of water prior to coming for your lab work. Thanks!   

## 2018-04-12 ENCOUNTER — Ambulatory Visit (INDEPENDENT_AMBULATORY_CARE_PROVIDER_SITE_OTHER): Payer: BC Managed Care – PPO | Admitting: General Practice

## 2018-04-12 ENCOUNTER — Other Ambulatory Visit: Payer: Self-pay | Admitting: General Practice

## 2018-04-12 DIAGNOSIS — Z7901 Long term (current) use of anticoagulants: Secondary | ICD-10-CM

## 2018-04-12 DIAGNOSIS — R760 Raised antibody titer: Secondary | ICD-10-CM

## 2018-04-12 LAB — POCT INR: INR: 1.5 — AB (ref 2.0–3.0)

## 2018-04-12 MED ORDER — COUMADIN 2.5 MG PO TABS: ORAL_TABLET | ORAL | 1 refills | Status: DC

## 2018-04-12 NOTE — Patient Instructions (Signed)
Pre visit review using our clinic review tool, if applicable. No additional management support is needed unless otherwise documented below in the visit note. 

## 2018-04-19 ENCOUNTER — Ambulatory Visit: Payer: BC Managed Care – PPO | Admitting: Psychology

## 2018-04-19 ENCOUNTER — Telehealth: Payer: Self-pay | Admitting: Rheumatology

## 2018-04-19 DIAGNOSIS — F411 Generalized anxiety disorder: Secondary | ICD-10-CM | POA: Diagnosis not present

## 2018-04-19 NOTE — Telephone Encounter (Signed)
Patient called stating her husband was diagnosed with the flu and because she has a comprised immune system the doctor gave her a prescription of Tamaflu.  Patient states she did not take her Methotrexate injection on Sunday and hasn't taken her Plaquenil the last two days.  Patient is requesting a return call to let her know if it is okay to take either medication.

## 2018-04-19 NOTE — Telephone Encounter (Signed)
Attempted to contact patient and left message for patient to call the office.  Per Hazel Sams, PA-C patient to hold MTX but okay to continue PLQ.

## 2018-04-20 NOTE — Telephone Encounter (Signed)
Patient advised she may continue PLQ but hold MTX while on Tamiflu. Patient verbalized understanding.

## 2018-04-22 ENCOUNTER — Ambulatory Visit: Payer: BC Managed Care – PPO | Admitting: Psychology

## 2018-04-22 DIAGNOSIS — F411 Generalized anxiety disorder: Secondary | ICD-10-CM

## 2018-04-28 ENCOUNTER — Other Ambulatory Visit: Payer: Self-pay | Admitting: Obstetrics and Gynecology

## 2018-04-28 DIAGNOSIS — Z1231 Encounter for screening mammogram for malignant neoplasm of breast: Secondary | ICD-10-CM

## 2018-04-29 ENCOUNTER — Ambulatory Visit: Payer: BC Managed Care – PPO | Admitting: Psychology

## 2018-04-29 DIAGNOSIS — F411 Generalized anxiety disorder: Secondary | ICD-10-CM | POA: Diagnosis not present

## 2018-05-04 ENCOUNTER — Other Ambulatory Visit: Payer: Self-pay

## 2018-05-04 DIAGNOSIS — Z79899 Other long term (current) drug therapy: Secondary | ICD-10-CM

## 2018-05-05 ENCOUNTER — Ambulatory Visit: Payer: BC Managed Care – PPO | Admitting: General Practice

## 2018-05-05 DIAGNOSIS — R760 Raised antibody titer: Secondary | ICD-10-CM

## 2018-05-05 DIAGNOSIS — Z7901 Long term (current) use of anticoagulants: Secondary | ICD-10-CM

## 2018-05-05 LAB — COMPLETE METABOLIC PANEL WITH GFR
AG Ratio: 1.5 (calc) (ref 1.0–2.5)
ALBUMIN MSPROF: 4.3 g/dL (ref 3.6–5.1)
ALT: 14 U/L (ref 6–29)
AST: 20 U/L (ref 10–35)
Alkaline phosphatase (APISO): 104 U/L (ref 33–130)
BUN: 11 mg/dL (ref 7–25)
CO2: 27 mmol/L (ref 20–32)
CREATININE: 0.84 mg/dL (ref 0.50–1.05)
Calcium: 9.9 mg/dL (ref 8.6–10.4)
Chloride: 104 mmol/L (ref 98–110)
GFR, EST AFRICAN AMERICAN: 88 mL/min/{1.73_m2} (ref >=60)
GFR, EST NON AFRICAN AMERICAN: 76 mL/min/{1.73_m2} (ref >=60)
GLOBULIN: 2.9 g/dL (ref 1.9–3.7)
GLUCOSE: 90 mg/dL (ref 65–99)
Potassium: 4.6 mmol/L (ref 3.5–5.3)
SODIUM: 139 mmol/L (ref 135–146)
TOTAL PROTEIN: 7.2 g/dL (ref 6.1–8.1)
Total Bilirubin: 0.9 mg/dL (ref 0.2–1.2)

## 2018-05-05 LAB — CBC WITH DIFFERENTIAL/PLATELET
Absolute Monocytes: 461 cells/uL (ref 200–950)
Basophils Absolute: 50 cells/uL (ref 0–200)
Basophils Relative: 0.7 %
Eosinophils Absolute: 101 cells/uL (ref 15–500)
Eosinophils Relative: 1.4 %
HCT: 44.8 % (ref 35.0–45.0)
Hemoglobin: 15 g/dL (ref 11.7–15.5)
Lymphs Abs: 2570 cells/uL (ref 850–3900)
MCH: 28.1 pg (ref 27.0–33.0)
MCHC: 33.5 g/dL (ref 32.0–36.0)
MCV: 83.9 fL (ref 80.0–100.0)
MPV: 12.1 fL (ref 7.5–12.5)
Monocytes Relative: 6.4 %
Neutro Abs: 4018 cells/uL (ref 1500–7800)
Neutrophils Relative %: 55.8 %
Platelets: 271 10*3/uL (ref 140–400)
RBC: 5.34 10*6/uL — ABNORMAL HIGH (ref 3.80–5.10)
RDW: 12.8 % (ref 11.0–15.0)
Total Lymphocyte: 35.7 %
WBC: 7.2 10*3/uL (ref 3.8–10.8)

## 2018-05-05 LAB — POCT INR: INR: 1.6 — AB (ref 2.0–3.0)

## 2018-05-05 NOTE — Patient Instructions (Addendum)
Pre visit review using our clinic review tool, if applicable. No additional management support is needed unless otherwise documented below in the visit note.  Continue to take 2 tablets daily except take 1 tablet on Thursday/Sunday.  Re-check in 4 weeks.

## 2018-05-06 ENCOUNTER — Ambulatory Visit: Payer: BC Managed Care – PPO | Admitting: Psychology

## 2018-05-13 ENCOUNTER — Ambulatory Visit (INDEPENDENT_AMBULATORY_CARE_PROVIDER_SITE_OTHER): Payer: BC Managed Care – PPO | Admitting: Psychology

## 2018-05-13 DIAGNOSIS — F411 Generalized anxiety disorder: Secondary | ICD-10-CM

## 2018-05-20 ENCOUNTER — Ambulatory Visit: Payer: BC Managed Care – PPO | Admitting: Psychology

## 2018-05-27 ENCOUNTER — Encounter (HOSPITAL_COMMUNITY): Payer: Self-pay

## 2018-05-27 ENCOUNTER — Emergency Department (HOSPITAL_COMMUNITY)
Admission: EM | Admit: 2018-05-27 | Discharge: 2018-05-28 | Disposition: A | Discharge status: Home / Self Care | Payer: BC Managed Care – PPO | Attending: Emergency Medicine | Admitting: Emergency Medicine

## 2018-05-27 ENCOUNTER — Other Ambulatory Visit: Payer: Self-pay

## 2018-05-27 ENCOUNTER — Ambulatory Visit: Payer: BC Managed Care – PPO | Admitting: Psychology

## 2018-05-27 ENCOUNTER — Emergency Department (HOSPITAL_COMMUNITY): Payer: BC Managed Care – PPO

## 2018-05-27 DIAGNOSIS — Z7901 Long term (current) use of anticoagulants: Secondary | ICD-10-CM | POA: Diagnosis not present

## 2018-05-27 DIAGNOSIS — Z79899 Other long term (current) drug therapy: Secondary | ICD-10-CM | POA: Insufficient documentation

## 2018-05-27 DIAGNOSIS — R079 Chest pain, unspecified: Secondary | ICD-10-CM | POA: Diagnosis present

## 2018-05-27 DIAGNOSIS — R0789 Other chest pain: Secondary | ICD-10-CM | POA: Diagnosis not present

## 2018-05-27 DIAGNOSIS — F411 Generalized anxiety disorder: Secondary | ICD-10-CM | POA: Diagnosis not present

## 2018-05-27 DIAGNOSIS — Z86711 Personal history of pulmonary embolism: Secondary | ICD-10-CM | POA: Diagnosis not present

## 2018-05-27 LAB — CBC
HEMATOCRIT: 45.7 % (ref 36.0–46.0)
Hemoglobin: 14.2 g/dL (ref 12.0–15.0)
MCH: 27 pg (ref 26.0–34.0)
MCHC: 31.1 g/dL (ref 30.0–36.0)
MCV: 86.9 fL (ref 80.0–100.0)
NRBC: 0 % (ref 0.0–0.2)
Platelets: 300 10*3/uL (ref 150–400)
RBC: 5.26 MIL/uL — ABNORMAL HIGH (ref 3.87–5.11)
RDW: 13.2 % (ref 11.5–15.5)
WBC: 11.8 10*3/uL — ABNORMAL HIGH (ref 4.0–10.5)

## 2018-05-27 LAB — BASIC METABOLIC PANEL
ANION GAP: 10 (ref 5–15)
BUN: 10 mg/dL (ref 6–20)
CO2: 23 mmol/L (ref 22–32)
Calcium: 9.3 mg/dL (ref 8.9–10.3)
Chloride: 107 mmol/L (ref 98–111)
Creatinine, Ser: 0.73 mg/dL (ref 0.44–1.00)
GFR calc non Af Amer: >60 mL/min (ref >60)
Glucose, Bld: 116 mg/dL — ABNORMAL HIGH (ref 70–99)
Potassium: 3.9 mmol/L (ref 3.5–5.1)
Sodium: 140 mmol/L (ref 135–145)

## 2018-05-27 LAB — I-STAT TROPONIN, ED: Troponin i, poc: 0 ng/mL (ref 0.00–0.08)

## 2018-05-27 NOTE — ED Triage Notes (Signed)
Pt c.o intermittent sharp chest pain that started earlier today. P has hx of PE 13 years ago and is on a blood thinner. Denies any SOB or leg swelling. Pt a.o, nad noted

## 2018-05-28 ENCOUNTER — Telehealth: Payer: Self-pay

## 2018-05-28 LAB — I-STAT TROPONIN, ED: Troponin i, poc: 0 ng/mL (ref 0.00–0.08)

## 2018-05-28 LAB — PROTIME-INR
INR: 1.72
Prothrombin Time: 19.9 seconds — ABNORMAL HIGH (ref 11.4–15.2)

## 2018-05-28 LAB — D-DIMER, QUANTITATIVE: D-Dimer, Quant: <0.27 ug/mL-FEU (ref 0.00–0.50)

## 2018-05-28 MED ORDER — LIDOCAINE 5 % EX PTCH
1.0000 | MEDICATED_PATCH | CUTANEOUS | Status: DC
  Administered 2018-05-28: 1 via TRANSDERMAL
  Filled 2018-05-28: qty 1

## 2018-05-28 MED ORDER — LORAZEPAM 1 MG PO TABS
0.5000 mg | ORAL_TABLET | Freq: Once | ORAL | Status: AC
  Administered 2018-05-28: 0.5 mg via ORAL
  Filled 2018-05-28: qty 1

## 2018-05-28 MED ORDER — LIDOCAINE 5 % EX PTCH: 1.0000 | MEDICATED_PATCH | CUTANEOUS | 0 refills | Status: DC

## 2018-05-28 MED ORDER — ACETAMINOPHEN 500 MG PO TABS
1000.0000 mg | ORAL_TABLET | Freq: Once | ORAL | Status: AC
  Administered 2018-05-28: 1000 mg via ORAL
  Filled 2018-05-28: qty 2

## 2018-05-28 NOTE — Telephone Encounter (Signed)
Would it be okay to work her in at the 3pm slot today or would you be able to work her in Monday ? Please advise Kerrin Champagne., RMA

## 2018-05-28 NOTE — Discharge Instructions (Signed)
Contact a health care provider if: You have a fever. Your chest pain becomes worse. You have new symptoms. Get help right away if: You have nausea or vomiting. You feel sweaty or light-headed. You have a cough with mucus from your lungs (sputum) or you cough up blood. You develop shortness of breath. 

## 2018-05-28 NOTE — Telephone Encounter (Signed)
Have her come in on Monday at   345     30 minutes  Override  Tell her I reviewed her record and it is VERY reassuring  .   that this is not serious disease.   And I can see her on Monday .Marland KitchenMarland Kitchen

## 2018-05-28 NOTE — Telephone Encounter (Signed)
Copied from [place] [phone]. Topic: General - Other >> May 28, 2018 10:40 AM Leward Quan A wrote: Reason for CRM: Patient called to request an appointment to see Dr Regis Bill today there were no available appointment but she then asked for Monday 05/31/2018 but none was available. Patient scheduled to be seen at first available 06/01/2018 she was not ok with that. Patient would like to know if she can be worked in to schedule for today or Monday. Ph# [phone]   From ER d/c : The patient continues to be very anxious and ruminates about any possibility.  Even after I went over all of her labs and assured her that she did not appear to have any emergent cause of her chest pain she still question whether it could be a pulmonary embolus.  It took multiple times to explain that her work-up was complete here in the emergency department and she was safe for discharge.   Suggest close outpatient follow-up with her PCP.  Follow-Ups: Schedule an appointment with Panosh, Standley Brooking, MD (Internal Medicine); in the next 5-7 days

## 2018-05-28 NOTE — Telephone Encounter (Signed)
Pt is scheduled for Monday at 3:45 and has been notified

## 2018-05-28 NOTE — ED Provider Notes (Addendum)
[facility] EMERGENCY DEPARTMENT Provider Note   CSN: [id number] Arrival date & time: 05/27/18  2137     History   Chief Complaint Chief Complaint  Patient presents with  . Chest Pain    HPI [name] is a 58 y.o. female who has a pmh of pulmonary embolus 13 years ago, chronic anticoagulation with Coumadin, history of rheumatoid arthritis on immune modulating agents.,  And severe anxiety who presents emergency department with chief complaint of left pain.  Patient had sudden onset of left-sided chest pain under her left breast beginning around 9 AM.  She states that the pain is worse when she takes a deep breath.  Pain does not radiate.  She denies any paresthesia, diaphoresis or nausea.  She denies any unilateral leg swelling she denies hemoptysis or shortness of breath.  Her INR has been therapeutic.  She is not on any estrogen supplementation and does not smoke.  She denies any recent injuries or confinement.  No recent surgeries.  She denies cough or shortness of breath.  He denies any rashes.  HPI  Past Medical History:  Diagnosis Date  . Adjustment disorder with anxiety   . B12 deficiency due to diet    is a vegetarian  . Chest pain, unspecified   . Chronic anticoagulation   . Headache(784.0)    migraines and head pain  . Hx of varicella    As Child  . Iron deficiency anemia, unspecified   . Otalgia, unspecified   . Other and unspecified hyperlipidemia   . Palpitations   . Personal history of venous thrombosis and embolism   . Recurrent UTI   . Rheumatoid arthritis(714.0)    deveshewar  . Unspecified vitamin D deficiency   . UTI (lower urinary tract infection) 11/28/2011   citrobacter       Patient Active Problem List   Diagnosis Date Noted  . Vertigo, benign paroxysmal, right 01/06/2018  . 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 Long term (current) use of anticoagulants 02/11/2017  . Sicca syndrome ([place]) 12/22/2016  .  ANA positive 12/22/2016  . 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 Migraine 03/31/2016  . Anxiety 03/31/2016  . High risk medication use 03/31/2016  . Primary osteoarthritis of both feet 03/31/2016  . Primary osteoarthritis of both knees 03/31/2016  . Stiffness of left hand joint 11/27/2014  . Elevated IOP 06/09/2014  . Long term current use of systemic steroids 06/09/2014  . Medically complex patient 06/09/2014  . Current use of steroid medication 01/09/2014  . Xerosis of skin 12/07/2013  . Encounter for therapeutic drug monitoring 06/09/2013  . B12 deficiency 05/31/2013  . Iron deficiency 05/31/2013  . Visit for preventive health examination 05/25/2012  . Mammogram abnormal 04/12/2012  . 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 Vaginal atrophy 01/23/2012  . History of recurrent UTI (urinary tract infection) 01/23/2012  . Osteoporosis 04/29/2011  . Nonspecific abnormal results of liver function study 12/08/2010  . Chronic anticoagulation   . Anticoagulant long-term use 07/15/2010  . 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 Chronic cough 03/19/2009  . CHEST PAIN-UNSPECIFIED 08/26/2008  . PALPITATIONS, RECURRENT 07/24/2008  . Vitamin D deficiency 05/30/2008  . HYPERLIPIDEMIA 05/30/2008  . IRON DEFICIENCY 05/30/2008  . Adjustment disorder with anxiety 05/30/2008  . Rheumatoid arthritis ([place]) 05/30/2008  . PULMONARY EMBOLISM, HX OF 05/30/2008    Past Surgical History:  Procedure Laterality Date  . ORIF ULNAR FRACTURE    . TYMPANOSTOMY TUBE  PLACEMENT  2009   Right     OB History    Gravida  1   Para  1   Term      Preterm      AB      Living        SAB      TAB      Ectopic      Multiple      Live Births               Home Medications    Prior to Admission medications   Medication Sig Start Date End Date Taking? Authorizing Provider  Calcium Carb-Cholecalciferol (CALTRATE 600+D) 600-800 MG-UNIT TABS Take by mouth.    [provider]  Cholecalciferol (VITAMIN D3) 2000 UNITS TABS Take 1 tablet by mouth daily.    [provider]  COUMADIN 2.5 MG tablet Take 2 tablets daily except 1 tablet on Sun and Thurs or AS DIRECTED BY ANTICOAGULATION CLINIC 04/12/18   Panosh, Standley Brooking, MD  cycloSPORINE (RESTASIS) 0.05 % ophthalmic emulsion Place 1 drop into both eyes daily.      [provider]  Eflornithine HCl (VANIQA) 13.9 % cream Apply topically 2 (two) times daily with a meal.    [provider]  folic acid (FOLVITE) 1 MG tablet TAKE 2 TABLETS BY MOUTH ONCE DAILY. 11/04/17   Bo Merino, MD  hydroxychloroquine (PLAQUENIL) 200 MG tablet TAKE 1 TABLET(200 MG) BY MOUTH DAILY 03/12/18   Bo Merino, MD  methotrexate 2.5 MG tablet TAKE 8 TABLETS BY MOUTH ONCE A WEEK 03/01/18   Bo Merino, MD  mineral/vitamin supplement (MULTIGEN) 70 MG TABS tablet TAKE 1 TABLET BY MOUTH ONCE DAILY 10/20/17   Panosh, Standley Brooking, MD  omeprazole (PRILOSEC) 20 MG capsule Take 20 mg by mouth as needed.    [provider]  SUMAtriptan (IMITREX) 50 MG tablet TAKE ONE TABLET BY MOUTH AS NEEDED. MAY REPEAT IN 2 TO 4 HOURS IF NEEDED. 06/27/15   Panosh, Standley Brooking, MD    Family History Family History  Problem Relation Age of Onset  . Dementia Father   . Stroke Father   . Hyperlipidemia Father   . Diabetes Father   . Parkinsonism Mother   . Lymphoma Other   . Colon cancer Neg Hx     Social History Social History   Tobacco Use  . Smoking status: Never Smoker  . Smokeless tobacco: Never Used  Substance Use Topics  . Alcohol use: No  . Drug use: Never     Allergies   Boniva [ibandronate sodium]; Ivp dye [iodinated diagnostic agents]; Other; and Risedronate sodium [risedronate sodium]   Review of Systems Review of Systems Ten systems reviewed and are negative for acute change, except as noted in the HPI.    Physical Exam Updated Vital Signs BP 134/80 (BP Location: Right Arm)   Pulse 77    Temp (!) 97.4 F (36.3 C) (Oral)   Resp 16   SpO2 99%   Physical Exam Vitals signs and nursing note reviewed.  Constitutional:      General: She is not in acute distress.    Appearance: She is well-developed. She is not diaphoretic.  HENT:     Head: Normocephalic and atraumatic.  Eyes:     General: No scleral icterus.    Extraocular Movements: Extraocular movements intact.     Conjunctiva/sclera: Conjunctivae normal.     Pupils: Pupils are equal, round, and reactive to light.  Neck:     Musculoskeletal: Normal range of motion.  Cardiovascular:     Rate and Rhythm: Normal rate and regular rhythm.     Heart sounds: Normal heart sounds. No murmur. No friction rub. No gallop.   Pulmonary:     Effort: Pulmonary effort is normal. No tachypnea or respiratory distress.     Breath sounds: Normal breath sounds.  Chest:     Chest wall: Tenderness present.       Comments: Tender to palpation under the left breast.  This reproduces the pain of complaint.  No rashes suggestive of zoster. Abdominal:     General: Bowel sounds are normal. There is no distension.     Palpations: Abdomen is soft. There is no mass.     Tenderness: There is no abdominal tenderness. There is no guarding.  Skin:    General: Skin is warm and dry.  Neurological:     Mental Status: She is alert and oriented to person, place, and time.  Psychiatric:        Behavior: Behavior normal.      ED Treatments / Results  Labs (all labs ordered are listed, but only abnormal results are displayed) Labs Reviewed  BASIC METABOLIC PANEL - Abnormal; Notable for the following components:      Result Value   Glucose, Bld 116 (*)    All other components within normal limits  CBC - Abnormal; Notable for the following components:   WBC 11.8 (*)    RBC 5.26 (*)    All other components within normal limits  PROTIME-INR  I-STAT TROPONIN, ED  I-STAT TROPONIN, ED    EKG EKG Interpretation  Date/Time:  Friday May 28 2018 01:56:39 EST Ventricular Rate:  62 PR Interval:  154 QRS Duration: 108 QT Interval:  411 QTC Calculation: 418 R Axis:   33 Text Interpretation:  Sinus rhythm ----------unconfirmed---------- Confirmed by UNCONFIRMED, DOCTOR ([id number]), editor Frederich Cha [phone]) on 05/28/2018 1:55:39 PM   Radiology Dg Chest 2 View  Result Date: 05/27/2018 CLINICAL DATA:  Left lower chest pain starting today. EXAM: CHEST - 2 VIEW COMPARISON:  CT chest 01/19/2018. Chest radiograph 02/01/2014 FINDINGS: Mild cardiac enlargement. No vascular congestion or edema. Linear fibrosis or atelectasis in the lung bases, new since previous chest radiograph. No airspace disease or consolidation to suggest acute disease. No blunting of costophrenic angles. No pneumothorax. Mediastinal contours appear intact. Degenerative changes in the spine. IMPRESSION: Mild cardiac enlargement. Linear atelectasis or fibrosis in the lung bases. Electronically Signed   By: Lucienne Capers M.D.   On: 05/27/2018 23:13    Procedures Procedures (including critical care time)  Medications Ordered in ED Medications - No data to display   Initial Impression / Assessment and Plan / ED Course  I have reviewed the triage vital signs and the nursing notes.  Pertinent labs & imaging results that were available during my care of the patient were reviewed by me and considered in my medical decision making (see chart for details).  Clinical Course as of May 29 619  Fri May 28, 2018  0157 INR: 1.72 [AH]    Clinical Course User Index [AH] Margarita Mail, PA-C    Patient here with sharp chest pain. The emergent differential diagnosis of chest pain includes: Acute coronary syndrome, pericarditis, aortic dissection, pulmonary embolism, tension pneumothorax, pneumonia, and esophageal rupture. Patient has reproducible chest wall pain on examination.  She has a heart score of 1, 2- troponins and a normal EKG along  with a chest x-ray which is  without significant abnormality.  I personally reviewed the chest x-ray.  Patient's d-dimer is negative.  Her INR goal is between 1.5 and 2.  She appears to be therapeutic port for her goal.  The patient continues to be very anxious and ruminates about any possibility.  Even after I went over all of her labs and assured her that she did not appear to have any emergent cause of her chest pain she still question whether it could be a pulmonary embolus.  It took multiple times to explain that her work-up was complete here in the emergency department and she was safe for discharge.  Suggest close outpatient follow-up with her PCP.  Patient be discharged with a Lidoderm patch.  Discussed ER return precautions.   Final Clinical Impressions(s) / ED Diagnoses   Final diagnoses:  None    ED Discharge Orders    None       Margarita Mail, PA-C 05/28/18 [id number]    Duffy Bruce, MD 05/30/18 [place], [place], PA-C 07/04/18 0947    Duffy Bruce, MD 07/05/18 [phone]

## 2018-05-31 ENCOUNTER — Ambulatory Visit: Payer: BC Managed Care – PPO | Admitting: Internal Medicine

## 2018-05-31 ENCOUNTER — Encounter: Payer: Self-pay | Admitting: Internal Medicine

## 2018-05-31 VITALS — BP 108/64 | HR 66 | Temp 97.8°F | Wt 164.6 lb

## 2018-05-31 DIAGNOSIS — Z79899 Other long term (current) drug therapy: Secondary | ICD-10-CM

## 2018-05-31 DIAGNOSIS — Z86718 Personal history of other venous thrombosis and embolism: Secondary | ICD-10-CM

## 2018-05-31 DIAGNOSIS — R079 Chest pain, unspecified: Secondary | ICD-10-CM

## 2018-05-31 DIAGNOSIS — M0609 Rheumatoid arthritis without rheumatoid factor, multiple sites: Secondary | ICD-10-CM

## 2018-05-31 DIAGNOSIS — I517 Cardiomegaly: Secondary | ICD-10-CM | POA: Diagnosis not present

## 2018-05-31 DIAGNOSIS — Z789 Other specified health status: Secondary | ICD-10-CM

## 2018-05-31 DIAGNOSIS — Z7901 Long term (current) use of anticoagulants: Secondary | ICD-10-CM | POA: Diagnosis not present

## 2018-05-31 NOTE — Patient Instructions (Addendum)
Glad you are doing better .  Will refer to cardiology  For  Evaluation   But doesn't sound like serious cardiac disease  Or inflammation.  If gets worse then contact for  Evaluation.    Will  get an echocardiogram because of the? Of mild enlargement of the heart shadow on chest x ray .

## 2018-05-31 NOTE — Progress Notes (Signed)
Chief Complaint  Patient presents with  . Follow-up    Pt states that her chest pain is better since taking predinsone .But still has some pain pt states that the chest pain was very sharp.    HPI: [name] 58 y.o. come in for follow-up of an emergency room evaluation for left-sided sharp chest pain.  Noted after began making dinner  Very sharp pain .  Left anterior under breast area without associated injury at the time shortness of breath had had a normal day.  [facility] for evaluation because of her history of clotting.  She describes some shortness of breath after that that she thought may be from anxiety.  Had some increased pain when she took very deep breath.  No fever but did begin to have some left ear pain which was reminiscent of a flare of her RA. She was evaluated in the ED had negative troponin x2 very low d-dimer on her Coumadin chest x-ray showed borderline cardiomegaly lower lobe atelectasis versus scarring but no acute findings.  EKG normal as well as lab work. Since that time she began a 5-day prednisone 9 mg sent down that she had on hand for potential RA flares and is noticed her ear pain went away and her left-sided chest pain is just about gone and she has 2 more days left.  Concerned about underlying cardiac problem would like to be checked out for this. size.     CT scan was not done in the ED because of risk benefit and question of allergy or upper airway response to contrast material in the past. At some point she was premedicated for a imaging material.  ROS: See pertinent positives and negatives per HPI.  No bleeding current cough she is on low-dose Coumadin.  Past Medical History:  Diagnosis Date  . Adjustment disorder with anxiety   . B12 deficiency due to diet    is a vegetarian  . Chest pain, unspecified   . Chronic anticoagulation   . Headache(784.0)    migraines and head pain  . Hx of varicella    As  Child  . Iron deficiency anemia, unspecified   . Otalgia, unspecified   . Other and unspecified hyperlipidemia   . Palpitations   . Personal history of venous thrombosis and embolism   . Recurrent UTI   . Rheumatoid arthritis(714.0)    deveshewar  . Unspecified vitamin D deficiency   . UTI (lower urinary tract infection) 11/28/2011   citrobacter       Family History  Problem Relation Age of Onset  . Dementia Father   . Stroke Father   . Hyperlipidemia Father   . Diabetes Father   . Parkinsonism Mother   . Lymphoma Other   . Colon cancer Neg Hx     Social History   Socioeconomic History  . Marital status: Married    Spouse name: Not on file  . Number of children: 1  . Years of education: Not on file  . Highest education level: Not on file  Occupational History  . Occupation: home maker  Social Needs  . Financial resource strain: Not on file  . Food insecurity:    Worry: Not on file    Inability: Not on file  . Transportation needs:    Medical: Not on file    Non-medical: Not on file  Tobacco Use  . Smoking status: Never Smoker  .  Smokeless tobacco: Never Used  Substance and Sexual Activity  . Alcohol use: No  . Drug use: Never  . Sexual activity: Not on file  Lifestyle  . Physical activity:    Days per week: Not on file    Minutes per session: Not on file  . Stress: Not on file  Relationships  . Social connections:    Talks on phone: Not on file    Gets together: Not on file    Attends religious service: Not on file    Active member of club or organization: Not on file    Attends meetings of clubs or organizations: Not on file    Relationship status: Not on file  Other Topics Concern  . Not on file  Social History Narrative   Regular Exercise-no   20 yrs in [place]   Son at [employer]   From [place].   Married HH  of 3    G1P1             Outpatient Medications Prior to Visit  Medication Sig Dispense Refill  . Calcium Carb-Cholecalciferol (CALTRATE  600+D) 600-800 MG-UNIT TABS Take by mouth.    . Cholecalciferol (VITAMIN D3) 2000 UNITS TABS Take 1 tablet by mouth daily.    Marland Kitchen COUMADIN 2.5 MG tablet Take 2 tablets daily except 1 tablet on Sun and Thurs or AS DIRECTED BY ANTICOAGULATION CLINIC 180 tablet 1  . cycloSPORINE (RESTASIS) 0.05 % ophthalmic emulsion Place 1 drop into both eyes daily.      . Eflornithine HCl (VANIQA) 13.9 % cream Apply topically 2 (two) times daily with a meal.    . folic acid (FOLVITE) 1 MG tablet TAKE 2 TABLETS BY MOUTH ONCE DAILY. 180 tablet 3  . hydroxychloroquine (PLAQUENIL) 200 MG tablet TAKE 1 TABLET(200 MG) BY MOUTH DAILY 90 tablet 0  . methotrexate 2.5 MG tablet TAKE 8 TABLETS BY MOUTH ONCE A WEEK 96 tablet 0  . mineral/vitamin supplement (MULTIGEN) 70 MG TABS tablet TAKE 1 TABLET BY MOUTH ONCE DAILY 90 tablet 1  . omeprazole (PRILOSEC) 20 MG capsule Take 20 mg by mouth as needed.    . SUMAtriptan (IMITREX) 50 MG tablet TAKE ONE TABLET BY MOUTH AS NEEDED. MAY REPEAT IN 2 TO 4 HOURS IF NEEDED. 9 tablet 5  . lidocaine (LIDODERM) 5 % Place 1 patch onto the skin daily. Remove & Discard patch within 12 hours or as directed by MD (Patient not taking: Reported on 05/31/2018) 12 patch 0   No facility-administered medications prior to visit.      EXAM:  BP 108/64 (BP Location: Right Arm, Patient Position: Sitting, Cuff Size: Normal)   Pulse 66   Temp 97.8 F (36.6 C) (Oral)   Wt 164 lb 9.6 oz (74.7 kg)   BMI 28.70 kg/m   Body mass index is 28.7 kg/m.  GENERAL: vitals reviewed and listed above, alert, oriented, appears well hydrated and in no acute distress mild anxiety but normal concern. HEENT: atraumatic, conjunctiva  clear, no obvious abnormalities on inspection of external nose and ears OP : no lesion edema or exudate  NECK: no obvious masses on inspection palpation  LUNGS: clear to auscultation bilaterally, no wheezes, rales or rhonchi, good air movement breast no nodule or discharges obvious she  points to the left anterior rib cage but no point tenderness at this time.  Abdomen soft without organomegaly guarding or rebound CV: HRRR, no clubbing cyanosis or  peripheral edema nl cap refill  MS: moves all  extremities without noticeable focal  abnormality PSYCH: pleasant and cooperative, mildly anxious.  Probably at baseline. Lab Results  Component Value Date   WBC 11.8 (H) 05/27/2018   HGB 14.2 05/27/2018   HCT 45.7 05/27/2018   PLT 300 05/27/2018   GLUCOSE 116 (H) 05/27/2018   CHOL 189 07/14/2017   TRIG 129.0 07/14/2017   HDL 71.40 07/14/2017   LDLDIRECT 97.3 05/25/2013   LDLCALC 92 07/14/2017   ALT 14 05/04/2018   AST 20 05/04/2018   NA 140 05/27/2018   K 3.9 05/27/2018   CL 107 05/27/2018   CREATININE 0.73 05/27/2018   BUN 10 05/27/2018   CO2 23 05/27/2018   TSH 3.72 07/14/2017   INR 1.72 05/27/2018   BP Readings from Last 3 Encounters:  05/31/18 108/64  05/28/18 (!) 116/96  04/07/18 97/65    ASSESSMENT AND PLAN:  Discussed the following assessment and plan:  Left-sided chest pain  Heart size increased   on chest  x ray ?  Chronic anticoagulation  Medically complex patient  Rheumatoid arthritis of multiple sites with negative rheumatoid factor (HCC)  High risk medication use  PULMONARY EMBOLISM, HX OF She has underlying complex medical disease including RA recurrent clotting risk hypercoagulability currently on low-dose Coumadin. Reviewed data will get echocardiogram because of the question of increased cardio shadow cardiology referral for consult question further evaluation imaging for cardiac risk potential.   The pain itself does not seem cardiac in nature but she has had atypical presentations of many underlying conditions.  She will explore get the information about what kind of contrast she had when she had a reaction. -Patient advised to return or notify health care team  if  new concerns arise.  Patient Instructions  Glad you are doing better  .  Will refer to cardiology  For  Evaluation   But doesn't sound like serious cardiac disease  Or inflammation.  If gets worse then contact for  Evaluation.    Will  get an echocardiogram because of the? Of mild enlargement of the heart shadow on chest x ray .                     Standley Brooking. Tunisha Ruland M.D.

## 2018-06-01 ENCOUNTER — Inpatient Hospital Stay: Payer: Self-pay | Admitting: Internal Medicine

## 2018-06-01 ENCOUNTER — Encounter

## 2018-06-02 ENCOUNTER — Ambulatory Visit: Payer: Self-pay

## 2018-06-03 ENCOUNTER — Other Ambulatory Visit: Payer: Self-pay | Admitting: Rheumatology

## 2018-06-03 ENCOUNTER — Other Ambulatory Visit: Payer: Self-pay

## 2018-06-03 ENCOUNTER — Ambulatory Visit (HOSPITAL_COMMUNITY): Payer: BC Managed Care – PPO | Attending: Cardiovascular Disease

## 2018-06-03 ENCOUNTER — Ambulatory Visit: Payer: BC Managed Care – PPO | Admitting: Psychology

## 2018-06-03 DIAGNOSIS — M0609 Rheumatoid arthritis without rheumatoid factor, multiple sites: Secondary | ICD-10-CM | POA: Diagnosis present

## 2018-06-03 DIAGNOSIS — Z86718 Personal history of other venous thrombosis and embolism: Secondary | ICD-10-CM | POA: Diagnosis present

## 2018-06-03 DIAGNOSIS — R079 Chest pain, unspecified: Secondary | ICD-10-CM | POA: Diagnosis present

## 2018-06-03 DIAGNOSIS — I517 Cardiomegaly: Secondary | ICD-10-CM | POA: Diagnosis present

## 2018-06-03 NOTE — Telephone Encounter (Addendum)
Last Visit: 04/07/18 Next visit: 09/08/18  No refill needed.

## 2018-06-03 NOTE — Telephone Encounter (Signed)
Last Visit: 04/07/18 Next visit: 09/08/18 Labs: 05/04/18 CMP WNL. RBC count elevated but stable. All other labs are WNL  Okay to refill per Dr. Estanislado Pandy

## 2018-06-04 ENCOUNTER — Telehealth: Payer: Self-pay | Admitting: Rheumatology

## 2018-06-04 MED ORDER — PREDNISONE 5 MG PO TABS: ORAL_TABLET | ORAL | 0 refills | Status: DC

## 2018-06-04 NOTE — Telephone Encounter (Signed)
Please advise patient to see her PCP for evaluation of ear pain.  She may have infection in her ear.  Okay to call in prednisone 5 mg tablets she can take 10 mg for 1 day, 7.5mg   for 1 day, 5 mg for 1 day, 2.5mg  for 1 day

## 2018-06-04 NOTE — Telephone Encounter (Signed)
Patient advised to follow up with PCP for evaluation for ear pain. Patient is worried that the 10 mg of Prednisone will increase her heart rate. Per Dr. Estanislado Pandy send in prescription for Prednisone 5 mg for 4 days and then 2.5 mg for 4 days. Patient advised.

## 2018-06-04 NOTE — Telephone Encounter (Signed)
Patient called stating she went to the hospital one week ago with chest pain.  Patient states she also had ear pain which she feels is from her RA.  Patient states she took Prednisone for 5 days and states she got better.  Patient states she tapered the dosage herself:  5 pills (1 mg tablet) the first day,4 pills the second day, 3 pills, then 2, then 1 pill.  Patient is requesting a prescription refill of Prednisone 1 mg tablets to be sent to [facility] on [address]  Patient states she only has 4 pills remaining and wants to refill before the long weekend.  Patient requested a return call.

## 2018-06-04 NOTE — Telephone Encounter (Signed)
Patient states she went to the hospital one week ago with chest pain. Patient will be following PCP.  Patient states she also had ear pain which she feels is from her RA.  Patient states she took Prednisone for 5 days and states she got better.  Patient states she tapered the dosage herself:  5 pills (1 mg tablet) the first day,4 pills the second day, 3 pills, then 2, then 1 pill.  Patient states she is having a sharp pain in her left ear again. She believes this is indicating a RA flare.  Patient is requesting a prescription for prednisone. Please advise.

## 2018-06-08 ENCOUNTER — Ambulatory Visit
Admission: RE | Admit: 2018-06-08 | Discharge: 2018-06-08 | Disposition: A | Discharge status: Home / Self Care | Payer: BC Managed Care – PPO | Source: Ambulatory Visit | Attending: Obstetrics and Gynecology | Admitting: Obstetrics and Gynecology

## 2018-06-08 DIAGNOSIS — Z1231 Encounter for screening mammogram for malignant neoplasm of breast: Secondary | ICD-10-CM

## 2018-06-10 ENCOUNTER — Ambulatory Visit: Payer: BC Managed Care – PPO | Admitting: Psychology

## 2018-06-15 ENCOUNTER — Other Ambulatory Visit: Payer: Self-pay | Admitting: Rheumatology

## 2018-06-15 NOTE — Telephone Encounter (Signed)
Last Visit: 04/07/18 Next visit: 09/08/18 Labs: 05/04/18 CMP WNL. RBC count elevated but stable. All other labs are WNL PLQ Eye Exam: 06/25/17 WNL   Okay to refill per Dr. Estanislado Pandy

## 2018-06-16 ENCOUNTER — Ambulatory Visit: Payer: BC Managed Care – PPO | Admitting: General Practice

## 2018-06-16 DIAGNOSIS — Z7901 Long term (current) use of anticoagulants: Secondary | ICD-10-CM

## 2018-06-16 DIAGNOSIS — R760 Raised antibody titer: Secondary | ICD-10-CM

## 2018-06-16 LAB — POCT INR: INR: 1.8 — AB (ref 2.0–3.0)

## 2018-06-16 NOTE — Patient Instructions (Signed)
Pre visit review using our clinic review tool, if applicable. No additional management support is needed unless otherwise documented below in the visit note.  Continue to take 2 tablets daily except take 1 tablet on Thursday/Sunday.  Re-check in 4 weeks.

## 2018-06-17 ENCOUNTER — Ambulatory Visit: Payer: BC Managed Care – PPO | Admitting: Psychology

## 2018-06-24 ENCOUNTER — Ambulatory Visit: Payer: BC Managed Care – PPO | Admitting: Psychology

## 2018-06-24 DIAGNOSIS — F411 Generalized anxiety disorder: Secondary | ICD-10-CM

## 2018-07-08 ENCOUNTER — Ambulatory Visit (INDEPENDENT_AMBULATORY_CARE_PROVIDER_SITE_OTHER): Payer: BC Managed Care – PPO | Admitting: Psychology

## 2018-07-08 DIAGNOSIS — F411 Generalized anxiety disorder: Secondary | ICD-10-CM

## 2018-07-12 ENCOUNTER — Encounter: Payer: Self-pay | Admitting: Interventional Cardiology

## 2018-07-14 ENCOUNTER — Ambulatory Visit (INDEPENDENT_AMBULATORY_CARE_PROVIDER_SITE_OTHER): Payer: BC Managed Care – PPO | Admitting: General Practice

## 2018-07-14 DIAGNOSIS — Z7901 Long term (current) use of anticoagulants: Secondary | ICD-10-CM

## 2018-07-14 DIAGNOSIS — R760 Raised antibody titer: Secondary | ICD-10-CM | POA: Diagnosis not present

## 2018-07-14 LAB — POCT INR: INR: 1.6 — AB (ref 2.0–3.0)

## 2018-07-14 NOTE — Patient Instructions (Addendum)
Pre visit review using our clinic review tool, if applicable. No additional management support is needed unless otherwise documented below in the visit note. Continue to take 2 tablets daily except take 1 tablet on Thursday/Sunday.  Re-check in 4 weeks.

## 2018-07-19 NOTE — Progress Notes (Signed)
Chief Complaint  Patient presents with  . Annual Exam    pt wants to follow up with echocardiogram and pt wants dark spot on forehead looked at     HPI: Patient  [name]  57 y.o. comes in today for Preventive Health Care visit  And Chronic disease management    Skin : dicolored forehead  Has derm not addressed at last visit   No pain   Had episode CP ed visit  Better  Has appt with dr Tamala Julian  Asks to review echo results ( Normal)  Du ro lab monitoring    Is anxious about son in [place] area   corono virus  And  Exposed to traveling people.   No recent   uti    RA no change in meds had course of pred for jaw pain   Due for dexa in July and  Prev  At [facility] not  Doing these any more    ocass use of imitrex  Can we refill urea 40 helps dry cracked heels  Asks about colon screening   Health Maintenance  Topic Date Due  . COLONOSCOPY  05/19/2016  . PAP SMEAR-Modifier  06/21/2019  . MAMMOGRAM  06/08/2020  . TETANUS/TDAP  06/01/2023  . INFLUENZA VACCINE  Completed  . Hepatitis C Screening  Completed  . HIV Screening  Completed   Health Maintenance Review LIFESTYLE:  Exercise:  notas much recently .   Used to do exercise bike at home.  Tobacco/ETS: no Alcohol:  no Sugar beverages: no Sleep: good  Drug use: no HH of 2 no pets   ROS:  See hpi  GEN/ HEENT: No fever, significant weight changes sweats headaches vision problems hearing changes, CV/ PULM; No chest pain shortness of breath cough, syncope,edema  change in exercise tolerance. GI /GU: No adominal pain, vomiting, change in bowel habits. No blood in the stool. No significant GU symptoms. SKIN/HEME: ,no acute skin rashes suspicious lesions or bleeding. No lymphadenopathy, nodules, masses.  NEURO/ PSYCH:  No neurologic signs such as weakness numbness. No depression anxiety. IMM/ Allergy: No unusual infections.  Allergy .   REST of 12 system review negative except as per HPI   Past Medical History:    Diagnosis Date  . Adjustment disorder with anxiety   . B12 deficiency due to diet    is a vegetarian  . Chest pain, unspecified   . Chronic anticoagulation   . Headache(784.0)    migraines and head pain  . Hx of varicella    As Child  . Iron deficiency anemia, unspecified   . Otalgia, unspecified   . Other and unspecified hyperlipidemia   . Palpitations   . Personal history of venous thrombosis and embolism   . Recurrent UTI   . Rheumatoid arthritis(714.0)    deveshewar  . Unspecified vitamin D deficiency   . UTI (lower urinary tract infection) 11/28/2011   citrobacter       Past Surgical History:  Procedure Laterality Date  . ORIF ULNAR FRACTURE    . TYMPANOSTOMY TUBE PLACEMENT  2009   Right    Family History  Problem Relation Age of Onset  . Dementia Father   . Stroke Father   . Hyperlipidemia Father   . Diabetes Father   . Parkinsonism Mother   . Lymphoma Other   . Colon cancer Neg Hx     Social History   Socioeconomic History  . Marital status: Married    Spouse name: Not  on file  . Number of children: 1  . Years of education: Not on file  . Highest education level: Not on file  Occupational History  . Occupation: home maker  Social Needs  . Financial resource strain: Not on file  . Food insecurity:    Worry: Not on file    Inability: Not on file  . Transportation needs:    Medical: Not on file    Non-medical: Not on file  Tobacco Use  . Smoking status: Never Smoker  . Smokeless tobacco: Never Used  Substance and Sexual Activity  . Alcohol use: No  . Drug use: Never  . Sexual activity: Not on file  Lifestyle  . Physical activity:    Days per week: Not on file    Minutes per session: Not on file  . Stress: Not on file  Relationships  . Social connections:    Talks on phone: Not on file    Gets together: Not on file    Attends religious service: Not on file    Active member of club or organization: Not on file    Attends meetings of  clubs or organizations: Not on file    Relationship status: Not on file  Other Topics Concern  . Not on file  Social History Narrative   Regular Exercise-no   20 yrs in [place]   Son at [facility]   From [place].   Married HH  of 3    G1P1             Outpatient Medications Prior to Visit  Medication Sig Dispense Refill  . Calcium Carb-Cholecalciferol (CALTRATE 600+D) 600-800 MG-UNIT TABS Take by mouth.    . Cholecalciferol (VITAMIN D3) 2000 UNITS TABS Take 1 tablet by mouth daily.    Marland Kitchen COUMADIN 2.5 MG tablet Take 2 tablets daily except 1 tablet on Sun and Thurs or AS DIRECTED BY ANTICOAGULATION CLINIC 180 tablet 1  . cycloSPORINE (RESTASIS) 0.05 % ophthalmic emulsion Place 1 drop into both eyes daily.      . Eflornithine HCl (VANIQA) 13.9 % cream Apply topically 2 (two) times daily with a meal.    . folic acid (FOLVITE) 1 MG tablet TAKE 2 TABLETS BY MOUTH ONCE DAILY. 180 tablet 3  . hydroxychloroquine (PLAQUENIL) 200 MG tablet TAKE 1 TABLET(200 MG) BY MOUTH DAILY 90 tablet 0  . methotrexate 2.5 MG tablet TAKE 8 TABLETS BY MOUTH ONCE A WEEK 96 tablet 0  . mineral/vitamin supplement (MULTIGEN) 70 MG TABS tablet TAKE 1 TABLET BY MOUTH ONCE DAILY 90 tablet 1  . omeprazole (PRILOSEC) 20 MG capsule Take 20 mg by mouth as needed.    . predniSONE (DELTASONE) 5 MG tablet Take 5 mg po for 4 days then 2.5 mg mg po x 4 days (Patient taking differently: as needed. Take 5 mg po for 4 days then 2.5 mg mg po x 4 days as needed) 6 tablet 0  . SUMAtriptan (IMITREX) 50 MG tablet TAKE ONE TABLET BY MOUTH AS NEEDED. MAY REPEAT IN 2 TO 4 HOURS IF NEEDED. 9 tablet 5  . lidocaine (LIDODERM) 5 % Place 1 patch onto the skin daily. Remove & Discard patch within 12 hours or as directed by MD (Patient not taking: Reported on 05/31/2018) 12 patch 0   No facility-administered medications prior to visit.      EXAM:  BP 118/62 (BP Location: Right Arm, Patient Position: Sitting, Cuff Size: Large)   Pulse 67   Temp 97.8  F (36.6 C) (Oral)   Ht '5\' 3"'$  (1.6 m)   Wt 166 lb 9.6 oz (75.6 kg)   BMI 29.51 kg/m   Body mass index is 29.51 kg/m. Wt Readings from Last 3 Encounters:  07/20/18 166 lb 9.6 oz (75.6 kg)  05/31/18 164 lb 9.6 oz (74.7 kg)  04/07/18 165 lb 6.4 oz (75 kg)    Physical Exam: Vital signs reviewed [id number] is a well-developed well-nourished alert cooperative    who appearsr stated age in no acute distress.  HEENT: normocephalic atraumatic , Eyes: PERRL EOM's full, conjunctiva clear, Nares: paten,t no deformity discharge or tenderness., Ears: no deformity EAC's clear TMs with normal landmarks. Mouth: clear OP, no lesions, edema.  Moist mucous membranes. Dentition in adequate repair. NECK: supple without masses, thyromegaly or bruits. CHEST/PULM:  Clear to auscultation and percussion breath sounds equal no wheeze , rales or rhonchi. No chest wall deformities or tenderness. Breast: normal by inspection . No dimpling, discharge, masses, tenderness or discharge . CV: PMI is nondisplaced, S1 S2 no gallops, murmurs, rubs. Peripheral pulses are full without delay.No JVD.  ABDOMEN: Bowel sounds normal nontender  No guard or rebound, no hepato splenomegal no CVA tenderness.   Extremtities:  No clubbing cyanosis or edema, no acute joint swelling or redness no focal atrophy NEURO:  Oriented x3, cranial nerves 3-12 appear to be intact, no obvious focal weakness,gait within normal limits no abnormal reflexes or asymmetrical SKIN: No acute rashes normal turgor, no bruising or petechiae.  Pigment blotchingon face and left forehead faint  Color change almost round but no  Scale or  Heels  Some cracking superficial  PSYCH: Oriented, good eye contact, no obvious depression anxiety, cognition and judgment appear normal. LN: no cervical axillary inguinal adenopathy  Lab Results  Component Value Date   WBC 7.4 07/20/2018   HGB 14.6 07/20/2018   HCT 44.9 07/20/2018   PLT 263.0 07/20/2018   GLUCOSE 91  07/20/2018   CHOL 184 07/20/2018   TRIG 142.0 07/20/2018   HDL 73.30 07/20/2018   LDLDIRECT 97.3 05/25/2013   LDLCALC 82 07/20/2018   ALT 16 07/20/2018   AST 18 07/20/2018   NA 138 07/20/2018   K 4.4 07/20/2018   CL 103 07/20/2018   CREATININE 0.75 07/20/2018   BUN 12 07/20/2018   CO2 28 07/20/2018   TSH 3.43 07/20/2018   INR 1.6 (A) 07/14/2018   HGBA1C 6.0 07/20/2018    BP Readings from Last 3 Encounters:  07/20/18 118/62  05/31/18 108/64  05/28/18 (!) 116/96    Lab plan  with patient   ASSESSMENT AND PLAN:  Discussed the following assessment and plan:  Visit for preventive health examination - Plan: CMP, Lipid panel, TSH, Hemoglobin A1c, CBC with Differential/Platelet  Medication management - Plan: DG Bone Density, Vitamin B12, IBC + Ferritin, VITAMIN D 25 Hydroxy (Vit-D Deficiency, Fractures), CMP, Lipid panel, TSH, Hemoglobin A1c, CBC with Differential/Platelet  Vitamin D deficiency - Plan: Vitamin B12, IBC + Ferritin, VITAMIN D 25 Hydroxy (Vit-D Deficiency, Fractures)  Anticardiolipin antibody positive  Long term current use of systemic steroids  Medically complex patient  Rheumatoid arthritis of multiple sites with negative rheumatoid factor (HCC) - Plan: CMP, Lipid panel, TSH, Hemoglobin A1c, CBC with Differential/Platelet  B12 deficiency - Plan: Vitamin B12, IBC + Ferritin, VITAMIN D 25 Hydroxy (Vit-D Deficiency, Fractures)  Osteoporosis, unspecified osteoporosis type, unspecified pathological fracture presence - Plan: DG Bone Density  Iron deficiency anemia, unspecified iron deficiency anemia type - Plan: Vitamin  B12, IBC + Ferritin, VITAMIN D 25 Hydroxy (Vit-D Deficiency, Fractures)  High risk medication use - Plan: CMP, Lipid panel, TSH, Hemoglobin A1c, CBC with Differential/Platelet  Elevated blood sugar - Plan: CMP, Lipid panel, TSH, Hemoglobin A1c  change skin color of forehead Hx of chest pain  Echo explained   Advise see dr Tamala Julian for cv  assessment of her sx  . She has stopped exercising from her anxiety    And can be assessed  To resume exercise etc .  Call if goes out of town or gets uti out of town  And I can  Send in antibiotic  If typical uncomplicated  Patient Care Team: Elynore Dolinski, Standley Brooking, MD as PCP - General Bo Merino, MD (Rheumatology) Richmond Campbell, MD (Gastroenterology) Leta Baptist, MD (Otolaryngology) Dohmeier, Asencion Partridge, MD (Neurology) Rolm Bookbinder, MD (Dermatology) Carolan Clines, MD (Inactive) as Attending Physician (Urology) Christophe Louis, MD as Consulting Physician (Obstetrics and Gynecology) Dionne Milo, Rockney Ghee, MD as Referring Physician (Hematology and Oncology) Charlotte Crumb, MD as Consulting Physician (Orthopedic Surgery) [facility] Patient Instructions  Shingrix  Vaccine  When you want.   Will notify you  of labs when available.  Keep appt with dr Tamala Julian .   You do not have an enlarged  Heart.   Skin area is not cancer but can check with derm  In future   Bone density  [facility]  [facility]  Check on cologuard vs  colonscopy  Let us know if we need to order this.   Preventive Care 40-64 Years, Female Preventive care refers to lifestyle choices and visits with your health care provider that can promote health and wellness. What does preventive care include?   A yearly physical exam. This is also called an annual well check.  Dental exams once or twice a year.  Routine eye exams. Ask your health care provider how often you should have your eyes checked.  Personal lifestyle choices, including: ? Daily care of your teeth and gums. ? Regular physical activity. ? Eating a healthy diet. ? Avoiding tobacco and drug use. ? Limiting alcohol use. ? Practicing safe sex. ? Taking low-dose aspirin daily starting at age 68. ? Taking vitamin and mineral supplements as recommended by your health care provider. What happens during an annual well check? The services and  screenings done by your health care provider during your annual well check will depend on your age, overall health, lifestyle risk factors, and family history of disease. Counseling Your health care provider may ask you questions about your:  Alcohol use.  Tobacco use.  Drug use.  Emotional well-being.  Home and relationship well-being.  Sexual activity.  Eating habits.  Work and work Statistician.  Method of birth control.  Menstrual cycle.  Pregnancy history. Screening You may have the following tests or measurements:  Height, weight, and BMI.  Blood pressure.  Lipid and cholesterol levels. These may be checked every 5 years, or more frequently if you are over 3 years old.  Skin check.  Lung cancer screening. You may have this screening every year starting at age 41 if you have a 30-pack-year history of smoking and currently smoke or have quit within the past 15 years.  Colorectal cancer screening. All adults should have this screening starting at age 15 and continuing until age 81. Your health care provider may recommend screening at age 80. You will have tests every 1-10 years, depending on your results and the type of screening  test. People at increased risk should start screening at an earlier age. Screening tests may include: ? Guaiac-based fecal occult blood testing. ? Fecal immunochemical test (FIT). ? Stool DNA test. ? Virtual colonoscopy. ? Sigmoidoscopy. During this test, a flexible tube with a tiny camera (sigmoidoscope) is used to examine your rectum and lower colon. The sigmoidoscope is inserted through your anus into your rectum and lower colon. ? Colonoscopy. During this test, a long, thin, flexible tube with a tiny camera (colonoscope) is used to examine your entire colon and rectum.  Hepatitis C blood test.  Hepatitis B blood test.  Sexually transmitted disease (STD) testing.  Diabetes screening. This is done by checking your blood sugar  (glucose) after you have not eaten for a while (fasting). You may have this done every 1-3 years.  Mammogram. This may be done every 1-2 years. Talk to your health care provider about when you should start having regular mammograms. This may depend on whether you have a family history of breast cancer.  BRCA-related cancer screening. This may be done if you have a family history of breast, ovarian, tubal, or peritoneal cancers.  Pelvic exam and Pap test. This may be done every 3 years starting at age 47. Starting at age 20, this may be done every 5 years if you have a Pap test in combination with an HPV test.  Bone density scan. This is done to screen for osteoporosis. You may have this scan if you are at high risk for osteoporosis. Discuss your test results, treatment options, and if necessary, the need for more tests with your health care provider. Vaccines Your health care provider may recommend certain vaccines, such as:  Influenza vaccine. This is recommended every year.  Tetanus, diphtheria, and acellular pertussis (Tdap, Td) vaccine. You may need a Td booster every 10 years.  Varicella vaccine. You may need this if you have not been vaccinated.  Zoster vaccine. You may need this after age 36.  Measles, mumps, and rubella (MMR) vaccine. You may need at least one dose of MMR if you were born in 1957 or later. You may also need a second dose.  Pneumococcal 13-valent conjugate (PCV13) vaccine. You may need this if you have certain conditions and were not previously vaccinated.  Pneumococcal polysaccharide (PPSV23) vaccine. You may need one or two doses if you smoke cigarettes or if you have certain conditions.  Meningococcal vaccine. You may need this if you have certain conditions.  Hepatitis A vaccine. You may need this if you have certain conditions or if you travel or work in places where you may be exposed to hepatitis A.  Hepatitis B vaccine. You may need this if you have  certain conditions or if you travel or work in places where you may be exposed to hepatitis B.  Haemophilus influenzae type b (Hib) vaccine. You may need this if you have certain conditions. Talk to your health care provider about which screenings and vaccines you need and how often you need them. This information is not intended to replace advice given to you by your health care provider. Make sure you discuss any questions you have with your health care provider. Document Released: 06/01/2015 Document Revised: 06/25/2017 Document Reviewed: 03/06/2015 Elsevier Interactive Patient Education  2019 [place]. Elliyah Liszewski M.D.

## 2018-07-20 ENCOUNTER — Ambulatory Visit (INDEPENDENT_AMBULATORY_CARE_PROVIDER_SITE_OTHER): Payer: BC Managed Care – PPO | Admitting: Internal Medicine

## 2018-07-20 ENCOUNTER — Encounter: Payer: Self-pay | Admitting: Internal Medicine

## 2018-07-20 VITALS — BP 118/62 | HR 67 | Temp 97.8°F | Ht 63.0 in | Wt 166.6 lb

## 2018-07-20 DIAGNOSIS — D509 Iron deficiency anemia, unspecified: Secondary | ICD-10-CM

## 2018-07-20 DIAGNOSIS — Z7952 Long term (current) use of systemic steroids: Secondary | ICD-10-CM

## 2018-07-20 DIAGNOSIS — Z0000 Encounter for general adult medical examination without abnormal findings: Secondary | ICD-10-CM | POA: Diagnosis not present

## 2018-07-20 DIAGNOSIS — E559 Vitamin D deficiency, unspecified: Secondary | ICD-10-CM

## 2018-07-20 DIAGNOSIS — R739 Hyperglycemia, unspecified: Secondary | ICD-10-CM | POA: Diagnosis not present

## 2018-07-20 DIAGNOSIS — M0609 Rheumatoid arthritis without rheumatoid factor, multiple sites: Secondary | ICD-10-CM | POA: Diagnosis not present

## 2018-07-20 DIAGNOSIS — R238 Other skin changes: Secondary | ICD-10-CM

## 2018-07-20 DIAGNOSIS — Z79899 Other long term (current) drug therapy: Secondary | ICD-10-CM | POA: Diagnosis not present

## 2018-07-20 DIAGNOSIS — E538 Deficiency of other specified B group vitamins: Secondary | ICD-10-CM | POA: Diagnosis not present

## 2018-07-20 DIAGNOSIS — Z789 Other specified health status: Secondary | ICD-10-CM

## 2018-07-20 DIAGNOSIS — M810 Age-related osteoporosis without current pathological fracture: Secondary | ICD-10-CM

## 2018-07-20 DIAGNOSIS — R760 Raised antibody titer: Secondary | ICD-10-CM

## 2018-07-20 LAB — IBC + FERRITIN
Ferritin: 166.1 ng/mL (ref 10.0–291.0)
Iron: 86 ug/dL (ref 42–145)
SATURATION RATIOS: 25.1 % (ref 20.0–50.0)
Transferrin: 245 mg/dL (ref 212.0–360.0)

## 2018-07-20 LAB — CBC WITH DIFFERENTIAL/PLATELET
Basophils Absolute: 0.1 10*3/uL (ref 0.0–0.1)
Basophils Relative: 0.8 % (ref 0.0–3.0)
Eosinophils Absolute: 0.1 10*3/uL (ref 0.0–0.7)
Eosinophils Relative: 1.3 % (ref 0.0–5.0)
HCT: 44.9 % (ref 36.0–46.0)
HEMOGLOBIN: 14.6 g/dL (ref 12.0–15.0)
Lymphocytes Relative: 39.3 % (ref 12.0–46.0)
Lymphs Abs: 2.9 10*3/uL (ref 0.7–4.0)
MCHC: 32.6 g/dL (ref 30.0–36.0)
MCV: 85.4 fl (ref 78.0–100.0)
MONO ABS: 0.5 10*3/uL (ref 0.1–1.0)
Monocytes Relative: 6.9 % (ref 3.0–12.0)
Neutro Abs: 3.8 10*3/uL (ref 1.4–7.7)
Neutrophils Relative %: 51.7 % (ref 43.0–77.0)
Platelets: 263 10*3/uL (ref 150.0–400.0)
RBC: 5.26 Mil/uL — ABNORMAL HIGH (ref 3.87–5.11)
RDW: 14.2 % (ref 11.5–15.5)
WBC: 7.4 10*3/uL (ref 4.0–10.5)

## 2018-07-20 LAB — COMPREHENSIVE METABOLIC PANEL
ALT: 16 U/L (ref 0–35)
AST: 18 U/L (ref 0–37)
Albumin: 4.5 g/dL (ref 3.5–5.2)
Alkaline Phosphatase: 104 U/L (ref 39–117)
BUN: 12 mg/dL (ref 6–23)
CHLORIDE: 103 meq/L (ref 96–112)
CO2: 28 mEq/L (ref 19–32)
Calcium: 9.7 mg/dL (ref 8.4–10.5)
Creatinine, Ser: 0.75 mg/dL (ref 0.40–1.20)
GFR: 78.81 mL/min (ref >60.00)
GLUCOSE: 91 mg/dL (ref 70–99)
Potassium: 4.4 mEq/L (ref 3.5–5.1)
Sodium: 138 mEq/L (ref 135–145)
Total Bilirubin: 0.8 mg/dL (ref 0.2–1.2)
Total Protein: 7.4 g/dL (ref 6.0–8.3)

## 2018-07-20 LAB — LIPID PANEL
Cholesterol: 184 mg/dL (ref 0–200)
HDL: 73.3 mg/dL (ref >39.00)
LDL Cholesterol: 82 mg/dL (ref 0–99)
NONHDL: 110.28
Total CHOL/HDL Ratio: 3
Triglycerides: 142 mg/dL (ref 0.0–149.0)
VLDL: 28.4 mg/dL (ref 0.0–40.0)

## 2018-07-20 LAB — HEMOGLOBIN A1C: Hgb A1c MFr Bld: 6 % (ref 4.6–6.5)

## 2018-07-20 LAB — VITAMIN B12: Vitamin B-12: 766 pg/mL (ref 211–911)

## 2018-07-20 LAB — TSH: TSH: 3.43 u[IU]/mL (ref 0.35–4.50)

## 2018-07-20 LAB — VITAMIN D 25 HYDROXY (VIT D DEFICIENCY, FRACTURES): VITD: 45.42 ng/mL (ref 30.00–100.00)

## 2018-07-20 MED ORDER — UREA 40 % EX CREA: TOPICAL_CREAM | CUTANEOUS | 1 refills | Status: DC

## 2018-07-20 NOTE — Patient Instructions (Addendum)
Shingrix  Vaccine  When you want.   Will notify you  of labs when available.  Keep appt with dr Tamala Julian .   You do not have an enlarged  Heart.   Skin area is not cancer but can check with derm  In future   Bone density  [facility]  [facility]  Check on cologuard vs  colonscopy  Let us know if we need to order this.   Preventive Care 40-64 Years, Female Preventive care refers to lifestyle choices and visits with your health care provider that can promote health and wellness. What does preventive care include?   A yearly physical exam. This is also called an annual well check.  Dental exams once or twice a year.  Routine eye exams. Ask your health care provider how often you should have your eyes checked.  Personal lifestyle choices, including: ? Daily care of your teeth and gums. ? Regular physical activity. ? Eating a healthy diet. ? Avoiding tobacco and drug use. ? Limiting alcohol use. ? Practicing safe sex. ? Taking low-dose aspirin daily starting at age 40. ? Taking vitamin and mineral supplements as recommended by your health care provider. What happens during an annual well check? The services and screenings done by your health care provider during your annual well check will depend on your age, overall health, lifestyle risk factors, and family history of disease. Counseling Your health care provider may ask you questions about your:  Alcohol use.  Tobacco use.  Drug use.  Emotional well-being.  Home and relationship well-being.  Sexual activity.  Eating habits.  Work and work Statistician.  Method of birth control.  Menstrual cycle.  Pregnancy history. Screening You may have the following tests or measurements:  Height, weight, and BMI.  Blood pressure.  Lipid and cholesterol levels. These may be checked every 5 years, or more frequently if you are over 92 years old.  Skin check.  Lung cancer screening. You may have this  screening every year starting at age 21 if you have a 30-pack-year history of smoking and currently smoke or have quit within the past 15 years.  Colorectal cancer screening. All adults should have this screening starting at age 45 and continuing until age 24. Your health care provider may recommend screening at age 38. You will have tests every 1-10 years, depending on your results and the type of screening test. People at increased risk should start screening at an earlier age. Screening tests may include: ? Guaiac-based fecal occult blood testing. ? Fecal immunochemical test (FIT). ? Stool DNA test. ? Virtual colonoscopy. ? Sigmoidoscopy. During this test, a flexible tube with a tiny camera (sigmoidoscope) is used to examine your rectum and lower colon. The sigmoidoscope is inserted through your anus into your rectum and lower colon. ? Colonoscopy. During this test, a long, thin, flexible tube with a tiny camera (colonoscope) is used to examine your entire colon and rectum.  Hepatitis C blood test.  Hepatitis B blood test.  Sexually transmitted disease (STD) testing.  Diabetes screening. This is done by checking your blood sugar (glucose) after you have not eaten for a while (fasting). You may have this done every 1-3 years.  Mammogram. This may be done every 1-2 years. Talk to your health care provider about when you should start having regular mammograms. This may depend on whether you have a family history of breast cancer.  BRCA-related cancer screening. This may be done if you  have a family history of breast, ovarian, tubal, or peritoneal cancers.  Pelvic exam and Pap test. This may be done every 3 years starting at age 57. Starting at age 39, this may be done every 5 years if you have a Pap test in combination with an HPV test.  Bone density scan. This is done to screen for osteoporosis. You may have this scan if you are at high risk for osteoporosis. Discuss your test results,  treatment options, and if necessary, the need for more tests with your health care provider. Vaccines Your health care provider may recommend certain vaccines, such as:  Influenza vaccine. This is recommended every year.  Tetanus, diphtheria, and acellular pertussis (Tdap, Td) vaccine. You may need a Td booster every 10 years.  Varicella vaccine. You may need this if you have not been vaccinated.  Zoster vaccine. You may need this after age 60.  Measles, mumps, and rubella (MMR) vaccine. You may need at least one dose of MMR if you were born in 1957 or later. You may also need a second dose.  Pneumococcal 13-valent conjugate (PCV13) vaccine. You may need this if you have certain conditions and were not previously vaccinated.  Pneumococcal polysaccharide (PPSV23) vaccine. You may need one or two doses if you smoke cigarettes or if you have certain conditions.  Meningococcal vaccine. You may need this if you have certain conditions.  Hepatitis A vaccine. You may need this if you have certain conditions or if you travel or work in places where you may be exposed to hepatitis A.  Hepatitis B vaccine. You may need this if you have certain conditions or if you travel or work in places where you may be exposed to hepatitis B.  Haemophilus influenzae type b (Hib) vaccine. You may need this if you have certain conditions. Talk to your health care provider about which screenings and vaccines you need and how often you need them. This information is not intended to replace advice given to you by your health care provider. Make sure you discuss any questions you have with your health care provider. Document Released: 06/01/2015 Document Revised: 06/25/2017 Document Reviewed: 03/06/2015 Elsevier Interactive Patient Education  2019 [employer].

## 2018-07-22 ENCOUNTER — Ambulatory Visit: Payer: BC Managed Care – PPO | Admitting: Psychology

## 2018-07-22 DIAGNOSIS — F411 Generalized anxiety disorder: Secondary | ICD-10-CM

## 2018-07-29 ENCOUNTER — Ambulatory Visit: Payer: BC Managed Care – PPO | Admitting: Interventional Cardiology

## 2018-07-29 ENCOUNTER — Encounter: Payer: Self-pay | Admitting: Rheumatology

## 2018-08-01 ENCOUNTER — Other Ambulatory Visit: Payer: Self-pay | Admitting: Rheumatology

## 2018-08-02 NOTE — Telephone Encounter (Signed)
Last Visit: 04/07/18 Next visit: 09/08/18 Labs: 07/20/18 RBC 5.26 rest of CBC WNL, CMP WNL  Okay to refill per Dr. Estanislado Pandy

## 2018-08-05 ENCOUNTER — Other Ambulatory Visit: Payer: Self-pay | Admitting: Rheumatology

## 2018-08-05 ENCOUNTER — Ambulatory Visit (INDEPENDENT_AMBULATORY_CARE_PROVIDER_SITE_OTHER): Payer: BC Managed Care – PPO | Admitting: Psychology

## 2018-08-05 ENCOUNTER — Telehealth: Payer: Self-pay | Admitting: Rheumatology

## 2018-08-05 DIAGNOSIS — F411 Generalized anxiety disorder: Secondary | ICD-10-CM | POA: Diagnosis not present

## 2018-08-05 NOTE — Telephone Encounter (Signed)
Last Visit: 04/07/18 Next visit: 09/08/18 Labs:07/20/18 RBC 5.26 rest of CBC WNL, CMP WNL PLQ Eye Exam: 06/25/17 WNL   Okay to refill per Dr. Deveshwar 

## 2018-08-05 NOTE — Telephone Encounter (Signed)
Patient left a voicemail requesting prescription refill of Plaquenil and Methotrexate to be sent to [facility] [place].  Patient states she had labwork with her PCP on 07/20/18.

## 2018-08-05 NOTE — Telephone Encounter (Signed)
Prescription for PLQ was sent to the pharmacy 08/05/18. Prescription sent for MTX was sent 08/02/18. Patient advised

## 2018-08-07 ENCOUNTER — Other Ambulatory Visit: Payer: Self-pay | Admitting: Internal Medicine

## 2018-08-09 ENCOUNTER — Telehealth: Payer: Self-pay

## 2018-08-09 NOTE — Telephone Encounter (Signed)
Re-scheduled patient.  Patient agreed to meet me in the parking lot to check INR.

## 2018-08-09 NOTE — Telephone Encounter (Signed)
Copied from [place] [phone]. Topic: General - Inquiry >> Aug 09, 2018  2:52 PM Virl Axe D wrote: Reason for CRM: Pt needs to speak with Jenny Reichmann regarding her Coumadin clinic appt on 08/11/18 and future appointments. She does not want to come in due to Covid 19. Please advise  Routing to cindy/CC, please advise, thanks

## 2018-08-10 ENCOUNTER — Telehealth: Payer: Self-pay

## 2018-08-10 MED ORDER — ALPRAZOLAM 0.25 MG PO TABS: 0.2500 mg | ORAL_TABLET | Freq: Two times a day (BID) | ORAL | 0 refills | Status: DC | PRN

## 2018-08-10 NOTE — Telephone Encounter (Signed)
Copied from [place] [id number]. Topic: Quick Communication - See Telephone Encounter >> Aug 10, 2018 11:13 AM Loma [place] wrote: CRM for notification. See Telephone encounter for: 08/10/18. PT sent Dr Mamie Nick a MyChart for Xanax that she had taken in the past, she was taking just 1/4 mlg the lowest dose. She was wanted refill but it has been some time. Was wanting to see if she could speak the Dr Mamie Nick nurse, she is not comfortable coming it. Maybe call her and suggest Telephonic visit, bit of language barrier to get her to understand?

## 2018-08-10 NOTE — Telephone Encounter (Signed)
Pt has been notified that it has been sent in

## 2018-08-10 NOTE — Telephone Encounter (Signed)
Sent in electronically . Sent in 20 at this time  Contact us if need more refills

## 2018-08-11 ENCOUNTER — Ambulatory Visit (INDEPENDENT_AMBULATORY_CARE_PROVIDER_SITE_OTHER): Payer: BC Managed Care – PPO | Admitting: General Practice

## 2018-08-11 ENCOUNTER — Other Ambulatory Visit: Payer: Self-pay

## 2018-08-11 ENCOUNTER — Ambulatory Visit: Payer: BC Managed Care – PPO

## 2018-08-11 DIAGNOSIS — R760 Raised antibody titer: Secondary | ICD-10-CM

## 2018-08-11 DIAGNOSIS — Z7901 Long term (current) use of anticoagulants: Secondary | ICD-10-CM

## 2018-08-11 LAB — POCT INR: INR: 2.3 (ref 2.0–3.0)

## 2018-08-11 NOTE — Patient Instructions (Addendum)
Pre visit review using our clinic review tool, if applicable. No additional management support is needed unless otherwise documented below in the visit note.  Take 1 tablet today and then take 2 tablets daily except take 1 tablet on Wed/Thursday/Sunday.  Re-check in 3 to 4 weeks.

## 2018-08-19 ENCOUNTER — Ambulatory Visit (INDEPENDENT_AMBULATORY_CARE_PROVIDER_SITE_OTHER): Payer: BC Managed Care – PPO | Admitting: Psychology

## 2018-08-19 DIAGNOSIS — F411 Generalized anxiety disorder: Secondary | ICD-10-CM

## 2018-08-25 ENCOUNTER — Other Ambulatory Visit: Payer: Self-pay | Admitting: Rheumatology

## 2018-08-26 MED ORDER — HYDROXYCHLOROQUINE SULFATE 200 MG PO TABS: 200.0000 mg | ORAL_TABLET | Freq: Every day | ORAL | 0 refills | Status: DC

## 2018-08-26 MED ORDER — ALPRAZOLAM 0.25 MG PO TABS: 0.2500 mg | ORAL_TABLET | Freq: Two times a day (BID) | ORAL | 0 refills | Status: DC | PRN

## 2018-08-26 NOTE — Telephone Encounter (Signed)
Last Visit: 04/07/18 Next visit: 09/08/18 Labs:07/20/18 RBC 5.26 rest of CBC WNL, CMP WNL PLQ Eye Exam: 06/25/17 WNL   Okay to refill per Dr. Estanislado Pandy

## 2018-08-26 NOTE — Telephone Encounter (Signed)
Will do this

## 2018-09-01 ENCOUNTER — Ambulatory Visit: Payer: Self-pay

## 2018-09-02 ENCOUNTER — Ambulatory Visit (INDEPENDENT_AMBULATORY_CARE_PROVIDER_SITE_OTHER): Payer: BC Managed Care – PPO | Admitting: Psychology

## 2018-09-02 DIAGNOSIS — F411 Generalized anxiety disorder: Secondary | ICD-10-CM | POA: Diagnosis not present

## 2018-09-08 ENCOUNTER — Ambulatory Visit: Payer: BC Managed Care – PPO | Admitting: Rheumatology

## 2018-09-08 ENCOUNTER — Ambulatory Visit (INDEPENDENT_AMBULATORY_CARE_PROVIDER_SITE_OTHER): Payer: BC Managed Care – PPO | Admitting: General Practice

## 2018-09-08 ENCOUNTER — Other Ambulatory Visit: Payer: Self-pay

## 2018-09-08 ENCOUNTER — Other Ambulatory Visit: Payer: Self-pay | Admitting: General Practice

## 2018-09-08 DIAGNOSIS — Z7901 Long term (current) use of anticoagulants: Secondary | ICD-10-CM

## 2018-09-08 DIAGNOSIS — R760 Raised antibody titer: Secondary | ICD-10-CM

## 2018-09-08 LAB — POCT INR: INR: 1.7 — AB (ref 2.0–3.0)

## 2018-09-08 MED ORDER — COUMADIN 2.5 MG PO TABS: ORAL_TABLET | ORAL | 1 refills | Status: DC

## 2018-09-08 NOTE — Patient Instructions (Signed)
Pre visit review using our clinic review tool, if applicable. No additional management support is needed unless otherwise documented below in the visit note.  Continue to take 2 tablets daily except take 1 tablet on Wed/Thursday/Sunday.  Re-check in 6 weeks.

## 2018-09-16 ENCOUNTER — Ambulatory Visit (INDEPENDENT_AMBULATORY_CARE_PROVIDER_SITE_OTHER): Payer: BC Managed Care – PPO | Admitting: Psychology

## 2018-09-16 ENCOUNTER — Telehealth: Payer: Self-pay | Admitting: Interventional Cardiology

## 2018-09-16 DIAGNOSIS — F411 Generalized anxiety disorder: Secondary | ICD-10-CM

## 2018-09-16 NOTE — Telephone Encounter (Signed)
Attempted to contact pt in regards to appt with Dr. Tamala Julian on 5/6.  Left message to call back.  Needs to be changed to a virtual visit with [facility] if possible.  Was going to offer 5/5 at 8:30, 9:30 or 11A or 5/6 at 8:30. 11:30 or 12:30p, whichever was still available.  Please contact me or Freddie Apley to schedule.

## 2018-09-17 NOTE — Telephone Encounter (Signed)
Spoke with pt and she preferred to postpone appt.  States she is having no issues.  Rescheduled for July.

## 2018-09-22 ENCOUNTER — Ambulatory Visit: Payer: BC Managed Care – PPO | Admitting: Interventional Cardiology

## 2018-09-30 ENCOUNTER — Ambulatory Visit (INDEPENDENT_AMBULATORY_CARE_PROVIDER_SITE_OTHER): Payer: BC Managed Care – PPO | Admitting: Psychology

## 2018-09-30 DIAGNOSIS — F411 Generalized anxiety disorder: Secondary | ICD-10-CM | POA: Diagnosis not present

## 2018-10-14 ENCOUNTER — Ambulatory Visit (INDEPENDENT_AMBULATORY_CARE_PROVIDER_SITE_OTHER): Payer: BC Managed Care – PPO | Admitting: Psychology

## 2018-10-14 DIAGNOSIS — F411 Generalized anxiety disorder: Secondary | ICD-10-CM | POA: Diagnosis not present

## 2018-10-14 NOTE — Progress Notes (Deleted)
Office Visit Note  Patient: [name]             Date of Birth: 1961/05/11           MRN: [id number]             PCP: Burnis Medin, MD Referring: Burnis Medin, MD Visit Date: 10/28/2018 Occupation: @GUAROCC @  Subjective:  No chief complaint on file.  She is taking methotrexate 8 tablets every 7 days, folic acid 1 mg 2 tablets daily, and Plaquenil 200 mg daily.  Last Plaquenil eye exam normal on 06/25/2017.  Most recent CBC stable and CMP within normal limits on 08/16/2018.  Due for CBC/CMP today and will monitor every 3 months.  Standing orders are in place.  She received her flu vaccine in October.  Recommend Prevnar 13, Pneumovax 23, and Shingrix vaccine as indicated.  History of Present Illness: [name] is a 58 y.o. female ***   Activities of Daily Living:  Patient reports morning stiffness for *** {minute/hour:19697}.   Patient {ACTIONS;DENIES/REPORTS:21021675::"Denies"} nocturnal pain.  Difficulty dressing/grooming: {ACTIONS;DENIES/REPORTS:21021675::"Denies"} Difficulty climbing stairs: {ACTIONS;DENIES/REPORTS:21021675::"Denies"} Difficulty getting out of chair: {ACTIONS;DENIES/REPORTS:21021675::"Denies"} Difficulty using hands for taps, buttons, cutlery, and/or writing: {ACTIONS;DENIES/REPORTS:21021675::"Denies"}  No Rheumatology ROS completed.   PMFS History:  Patient Active Problem List   Diagnosis Date Noted  . 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[place]) 01/06/2018  . Long term (current) use of anticoagulants 02/11/2017  . Sicca syndrome ([place]) 12/22/2016  . ANA positive 12/22/2016  . 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 Migraine 03/31/2016  . Anxiety 03/31/2016  . High risk medication use 03/31/2016  . Primary osteoarthritis of both feet 03/31/2016  . Primary osteoarthritis of both knees 03/31/2016  . Stiffness of left hand joint 11/27/2014  . Elevated IOP 06/09/2014  . Long term current use of systemic steroids 06/09/2014   . Medically complex patient 06/09/2014  . Current use of steroid medication 01/09/2014  . Xerosis of skin 12/07/2013  . Encounter for therapeutic drug monitoring 06/09/2013  . B12 deficiency 05/31/2013  . Iron deficiency 05/31/2013  . Visit for preventive health examination 05/25/2012  . Mammogram abnormal 04/12/2012  . 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 Vaginal atrophy 01/23/2012  . History of recurrent UTI (urinary tract infection) 01/23/2012  . Osteoporosis 04/29/2011  . Nonspecific abnormal results of liver function study 12/08/2010  . Chronic anticoagulation   . Anticoagulant long-term use 07/15/2010  . 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 Chronic cough 03/19/2009  . CHEST PAIN-UNSPECIFIED 08/26/2008  . PALPITATIONS, RECURRENT 07/24/2008  . Vitamin D deficiency 05/30/2008  . HYPERLIPIDEMIA 05/30/2008  . IRON DEFICIENCY 05/30/2008  . Adjustment disorder with anxiety 05/30/2008  . Rheumatoid arthritis ([place]) 05/30/2008  . PULMONARY EMBOLISM, HX OF 05/30/2008    Past Medical History:  Diagnosis Date  . Adjustment disorder with anxiety   . B12 deficiency due to diet    is a vegetarian  . Chest pain, unspecified   . Chronic anticoagulation   . Headache(784.0)    migraines and head pain  . Hx of varicella    As Child  . Iron deficiency anemia, unspecified   . Otalgia, unspecified   . Other and unspecified hyperlipidemia   . Palpitations   . Personal history of venous thrombosis and embolism   . Recurrent UTI   . Rheumatoid arthritis(714.0)    deveshewar  . Unspecified vitamin D deficiency   . UTI (lower urinary tract infection) 11/28/2011   citrobacter  Family History  Problem Relation Age of Onset  . Dementia Father   . Stroke Father   . Hyperlipidemia Father   . Diabetes Father   . Parkinsonism Mother   . Lymphoma Other   . Colon cancer Neg Hx    Past Surgical History:  Procedure Laterality Date  . ORIF ULNAR FRACTURE    . TYMPANOSTOMY TUBE  PLACEMENT  2009   Right   Social History   Social History Narrative   Regular Exercise-no   20 yrs in [place]   Son at [employer] [facility] [employer]   From [place].   Married HH  of 3    G1P1            Immunization History  Administered Date(s) Administered  . Influenza,inj,Quad PF,6+ Mos 04/19/2013, 06/27/2015, 03/05/2016, 02/11/2017, 03/15/2018  . PPD Test 07/21/2016  . Tdap 05/31/2013     Objective: Vital Signs: There were no vitals taken for this visit.   Physical Exam   Musculoskeletal Exam: ***  CDAI Exam: CDAI Score: Not documented Patient Global Assessment: Not documented; Provider Global Assessment: Not documented Swollen: Not documented; Tender: Not documented Joint Exam   Not documented   There is currently no information documented on the homunculus. Go to the Rheumatology activity and complete the homunculus joint exam.  Investigation: No additional findings.  Imaging: No results found.  Recent Labs: Lab Results  Component Value Date   WBC 7.4 07/20/2018   HGB 14.6 07/20/2018   PLT 263.0 07/20/2018   NA 138 07/20/2018   K 4.4 07/20/2018   CL 103 07/20/2018   CO2 28 07/20/2018   GLUCOSE 91 07/20/2018   BUN 12 07/20/2018   CREATININE 0.75 07/20/2018   BILITOT 0.8 07/20/2018   ALKPHOS 104 07/20/2018   AST 18 07/20/2018   ALT 16 07/20/2018   PROT 7.4 07/20/2018   ALBUMIN 4.5 07/20/2018   CALCIUM 9.7 07/20/2018   GFRAA >60 05/27/2018    Speciality Comments: PLQ Eye Exam: 06/25/17 WNL @ [facility] Follow up 1 year  Procedures:  No procedures performed Allergies: Boniva [ibandronate sodium]; Ivp dye [iodinated diagnostic agents]; Other; and Risedronate sodium [risedronate sodium]   Assessment / Plan:     Visit Diagnoses: No diagnosis found.   Orders: No orders of the defined types were placed in this encounter.  No orders of the defined types were placed in this encounter.   Face-to-face time spent with patient was *** minutes. Greater than 50% of  time was spent in counseling and coordination of care.  Follow-Up Instructions: No follow-ups on file.   Earnestine Mealing, CMA  Note - This record has been created using Editor, commissioning.  Chart creation errors have been sought, but may not always  have been located. Such creation errors do not reflect on  the standard of medical care.

## 2018-10-20 ENCOUNTER — Ambulatory Visit (INDEPENDENT_AMBULATORY_CARE_PROVIDER_SITE_OTHER): Payer: BC Managed Care – PPO | Admitting: General Practice

## 2018-10-20 ENCOUNTER — Other Ambulatory Visit: Payer: Self-pay

## 2018-10-20 DIAGNOSIS — Z7901 Long term (current) use of anticoagulants: Secondary | ICD-10-CM

## 2018-10-20 DIAGNOSIS — R760 Raised antibody titer: Secondary | ICD-10-CM

## 2018-10-20 LAB — POCT INR: INR: 1.6 — AB (ref 2.0–3.0)

## 2018-10-20 NOTE — Progress Notes (Signed)
I have reviewed the results and agree with this plan   

## 2018-10-20 NOTE — Patient Instructions (Signed)
Pre visit review using our clinic review tool, if applicable. No additional management support is needed unless otherwise documented below in the visit note.  Continue to take 2 tablets daily except take 1 tablet on Wed/Thursday/Sunday.  Re-check in 6 weeks.

## 2018-10-26 ENCOUNTER — Telehealth: Payer: Self-pay | Admitting: Rheumatology

## 2018-10-26 NOTE — Telephone Encounter (Signed)
She should get her labs in June and keep her follow-up appointment.  The letter should be given by her husband's physician.

## 2018-10-26 NOTE — Telephone Encounter (Signed)
Patient requested to reschedule her appointment, the appointment has been rescheduled for July 8th 2020. Advised patient we could not send a letter to her husbands employer as he is not our patient. Patient then requested a letter stating she has a compromised immune system sent to her via mail and mychart. (Per Dr. Estanislado Pandy, it is ok to provide this letter). Letter has been sent and mailed.

## 2018-10-26 NOTE — Telephone Encounter (Signed)
Patient called requesting a return call to let her know if Dr. Estanislado Pandy is okay with delaying her labwork for a couple of months and also rescheduling her follow-up appointment that is scheduled for Thursday 10/28/18.  Patient didn't want to cancel until she spoke with her nurse first.    Patient is also asking for a letter for her husband's employer stating he lives with someone that has a compromised immune system.  Patient states her husband is a professor.

## 2018-10-28 ENCOUNTER — Ambulatory Visit (INDEPENDENT_AMBULATORY_CARE_PROVIDER_SITE_OTHER): Payer: BC Managed Care – PPO | Admitting: Psychology

## 2018-10-28 ENCOUNTER — Ambulatory Visit: Payer: Self-pay | Admitting: Rheumatology

## 2018-10-28 DIAGNOSIS — F411 Generalized anxiety disorder: Secondary | ICD-10-CM | POA: Diagnosis not present

## 2018-10-31 ENCOUNTER — Other Ambulatory Visit: Payer: Self-pay | Admitting: Rheumatology

## 2018-11-01 ENCOUNTER — Telehealth: Payer: Self-pay | Admitting: Rheumatology

## 2018-11-01 MED ORDER — METHOTREXATE SODIUM 2.5 MG PO TABS: 20.0000 mg | ORAL_TABLET | ORAL | 0 refills | Status: DC

## 2018-11-01 MED ORDER — FOLIC ACID 1 MG PO TABS: 2.0000 mg | ORAL_TABLET | Freq: Every day | ORAL | 3 refills | Status: DC

## 2018-11-01 NOTE — Telephone Encounter (Signed)
Last Visit: 04/07/2018 Next Visit: 11/24/2018 Labs: 07/20/2018 RBC 5.26  Okay to refill per Dr. Estanislado Pandy.

## 2018-11-01 NOTE — Telephone Encounter (Signed)
Patient called requesting prescription refill of Folic Acid 1 mg tablet 3 month supply.  Patient states she has changed pharmacies and needs the prescription sent to [facility] at [address].

## 2018-11-01 NOTE — Telephone Encounter (Signed)
Last Visit: 04/07/2018 Next Visit: 11/24/2018  Okay to refill per Dr. Estanislado Pandy.

## 2018-11-11 ENCOUNTER — Ambulatory Visit: Payer: BC Managed Care – PPO | Admitting: Psychology

## 2018-11-11 NOTE — Progress Notes (Deleted)
Office Visit Note  Patient: [name]             Date of Birth: 20-Sep-1960           MRN: [id number]             PCP: Burnis Medin, MD Referring: Burnis Medin, MD Visit Date: 11/24/2018 Occupation: @GUAROCC @  Subjective:  No chief complaint on file.   She is taking methotrexate 8 tablets every 7 days, folic acid 1 mg 2 tablets daily, and Plaquenil 200 mg daily.  Last Plaquenil eye exam normal on 06/25/2017.  Most recent CBC stable and CMP within normal limits on 08/16/2018.  Due for CBC/CMP today and will monitor every 3 months.  Standing orders are in place.  She received her flu vaccine in October.  Recommend Prevnar 13, Pneumovax 23, and Shingrix vaccine as indicated.  She discontinued fosamax 70 mg po q week in May 2018 due to worsening reflux.  Last DEXA in July 2018 showed T score of -2.5 and BMD of 0.736 at left hip. She takes a calcium and vitamin D supplement daily. Vitamin D was 42 on 10/14/17. Due for repeat DEXA.  History of Present Illness: [name] is a 58 y.o. female ***   Activities of Daily Living:  Patient reports morning stiffness for *** {minute/hour:19697}.   Patient {ACTIONS;DENIES/REPORTS:21021675::"Denies"} nocturnal pain.  Difficulty dressing/grooming: {ACTIONS;DENIES/REPORTS:21021675::"Denies"} Difficulty climbing stairs: {ACTIONS;DENIES/REPORTS:21021675::"Denies"} Difficulty getting out of chair: {ACTIONS;DENIES/REPORTS:21021675::"Denies"} Difficulty using hands for taps, buttons, cutlery, and/or writing: {ACTIONS;DENIES/REPORTS:21021675::"Denies"}  No Rheumatology ROS completed.   PMFS History:  Patient Active Problem List   Diagnosis Date Noted  . 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 Long term (current) use of anticoagulants 02/11/2017  . Sicca syndrome ([place]) 12/22/2016  . ANA positive 12/22/2016  . 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 Migraine 03/31/2016  . Anxiety 03/31/2016  . High  risk medication use 03/31/2016  . Primary osteoarthritis of both feet 03/31/2016  . Primary osteoarthritis of both knees 03/31/2016  . Stiffness of left hand joint 11/27/2014  . Elevated IOP 06/09/2014  . Long term current use of systemic steroids 06/09/2014  . Medically complex patient 06/09/2014  . Current use of steroid medication 01/09/2014  . Xerosis of skin 12/07/2013  . Encounter for therapeutic drug monitoring 06/09/2013  . B12 deficiency 05/31/2013  . Iron deficiency 05/31/2013  . Visit for preventive health examination 05/25/2012  . Mammogram abnormal 04/12/2012  . 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 Vaginal atrophy 01/23/2012  . History of recurrent UTI (urinary tract infection) 01/23/2012  . Osteoporosis 04/29/2011  . Nonspecific abnormal results of liver function study 12/08/2010  . Chronic anticoagulation   . Anticoagulant long-term use 07/15/2010  . 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 Chronic cough 03/19/2009  . CHEST PAIN-UNSPECIFIED 08/26/2008  . PALPITATIONS, RECURRENT 07/24/2008  . Vitamin D deficiency 05/30/2008  . HYPERLIPIDEMIA 05/30/2008  . IRON DEFICIENCY 05/30/2008  . Adjustment disorder with anxiety 05/30/2008  . Rheumatoid arthritis ([place]) 05/30/2008  . PULMONARY EMBOLISM, HX OF 05/30/2008    Past Medical History:  Diagnosis Date  . Adjustment disorder with anxiety   . B12 deficiency due to diet    is a vegetarian  . Chest pain, unspecified   . Chronic anticoagulation   . Headache(784.0)    migraines and head pain  . Hx of varicella    As Child  . Iron deficiency anemia, unspecified   . Otalgia, unspecified   .  Other and unspecified hyperlipidemia   . Palpitations   . Personal history of venous thrombosis and embolism   . Recurrent UTI   . Rheumatoid arthritis(714.0)    deveshewar  . Unspecified vitamin D deficiency   . UTI (lower urinary tract infection) 11/28/2011   citrobacter       Family History  Problem Relation Age of  Onset  . Dementia Father   . Stroke Father   . Hyperlipidemia Father   . Diabetes Father   . Parkinsonism Mother   . Lymphoma Other   . Colon cancer Neg Hx    Past Surgical History:  Procedure Laterality Date  . ORIF ULNAR FRACTURE    . TYMPANOSTOMY TUBE PLACEMENT  2009   Right   Social History   Social History Narrative   Regular Exercise-no   20 yrs in [place]   Son at [facility]   From [place].   Married HH  of 3    G1P1            Immunization History  Administered Date(s) Administered  . Influenza,inj,Quad PF,6+ Mos 04/19/2013, 06/27/2015, 03/05/2016, 02/11/2017, 03/15/2018  . PPD Test 07/21/2016  . Tdap 05/31/2013     Objective: Vital Signs: There were no vitals taken for this visit.   Physical Exam   Musculoskeletal Exam: ***  CDAI Exam: CDAI Score: - Patient Global: -; Provider Global: - Swollen: -; Tender: - Joint Exam   No joint exam has been documented for this visit   There is currently no information documented on the homunculus. Go to the Rheumatology activity and complete the homunculus joint exam.  Investigation: No additional findings.  Imaging: No results found.  Recent Labs: Lab Results  Component Value Date   WBC 7.4 07/20/2018   HGB 14.6 07/20/2018   PLT 263.0 07/20/2018   NA 138 07/20/2018   K 4.4 07/20/2018   CL 103 07/20/2018   CO2 28 07/20/2018   GLUCOSE 91 07/20/2018   BUN 12 07/20/2018   CREATININE 0.75 07/20/2018   BILITOT 0.8 07/20/2018   ALKPHOS 104 07/20/2018   AST 18 07/20/2018   ALT 16 07/20/2018   PROT 7.4 07/20/2018   ALBUMIN 4.5 07/20/2018   CALCIUM 9.7 07/20/2018   GFRAA >60 05/27/2018    Speciality Comments: PLQ Eye Exam: 06/25/17 WNL @ [facility] Follow up 1 year  Procedures:  No procedures performed Allergies: Boniva [ibandronate sodium], Ivp dye [iodinated diagnostic agents], Other, and Risedronate sodium [risedronate sodium]   Assessment / Plan:     Visit Diagnoses: No diagnosis found.    Orders: No orders of the defined types were placed in this encounter.  No orders of the defined types were placed in this encounter.   Face-to-face time spent with patient was *** minutes. Greater than 50% of time was spent in counseling and coordination of care.  Follow-Up Instructions: No follow-ups on file.   Earnestine Mealing, CMA  Note - This record has been created using Editor, commissioning.  Chart creation errors have been sought, but may not always  have been located. Such creation errors do not reflect on  the standard of medical care.

## 2018-11-21 ENCOUNTER — Telehealth: Payer: Self-pay | Admitting: *Deleted

## 2018-11-22 NOTE — Progress Notes (Signed)
Virtual Visit via Video Note  I connected with@ on 11/23/18 at  1:15 PM EDT by a video enabled telemedicine application and verified that I am speaking with the correct person using two identifiers. Location patient: home Location provider:  office Persons participating in the virtual visit: patient, provider  [place]th national recommendations  regarding COVID 19 pandemic   video visit is advised over in office visit for this patient.  Patient aware  of the limitations of evaluation and management by telemedicine and  availability of in person appointments. and agreed to proceed.   HPI: [name] presents for video visit  Having some sx and concern about covid19   Last week had minor nose sx and then low grade temp 99 range and seats but no chills  One day of head  Pressure but no HA  And minor ocass cough  . No sob but getting  Dec appetite and sx after but not HB.  At night getting racing  Heart ? And feeling badly then  Went to get  COVID19 test at [facility] yesterday ( self admin nasal swab )  To get results  In a week. husband ok had one day of st   Sone back from [place] had isolated for 25 days  And not they are  Isolating in the house only out for food groceries .  Stopped the mtx incase   Needs erfill alpraz  mulltigen and imitrexa  Taking  alpraz most days since  Last month .   Asks about keeping  appt with Dr Tamala Julian  Cardiology. This week .  ROS: See pertinent positives and negatives per HPI.  Past Medical History:  Diagnosis Date  . Adjustment disorder with anxiety   . B12 deficiency due to diet    is a vegetarian  . Chest pain, unspecified   . Chronic anticoagulation   . Headache(784.0)    migraines and head pain  . Hx of varicella    As Child  . Iron deficiency anemia, unspecified   . Otalgia, unspecified   . Other and unspecified hyperlipidemia   . Palpitations   . Personal history of venous thrombosis and embolism   . Recurrent UTI   . Rheumatoid arthritis(714.0)    deveshewar  . Unspecified vitamin D deficiency   . UTI (lower urinary tract infection) 11/28/2011   citrobacter       Past Surgical History:  Procedure Laterality Date  . ORIF ULNAR FRACTURE    . TYMPANOSTOMY TUBE PLACEMENT  2009   Right    Family History  Problem Relation Age of Onset  . Dementia Father   . Stroke Father   . Hyperlipidemia Father   . Diabetes Father   . Parkinsonism Mother   . Lymphoma Other   . Colon cancer Neg Hx     Social History   Tobacco Use  . Smoking status: Never Smoker  . Smokeless tobacco: Never Used  Substance Use Topics  . Alcohol use: No  . Drug use: Never      Current Outpatient Medications:  .  ALPRAZolam (XANAX) 0.25 MG tablet, Take 1 tablet (0.25 mg total) by mouth 2 (two) times daily as needed for anxiety., Disp: 60 tablet, Rfl: 0 .  Calcium Carb-Cholecalciferol (CALTRATE 600+D) 600-800 MG-UNIT TABS, Take by mouth., Disp: , Rfl:  .  Cholecalciferol (VITAMIN D3) 2000 UNITS TABS, Take 1 tablet by mouth daily., Disp: , Rfl:  .  COUMADIN 2.5 MG tablet, Take 2 tablets daily except 1  tablet on Sun and Thurs or AS DIRECTED BY ANTICOAGULATION CLINIC, Disp: 180 tablet, Rfl: 1 .  cycloSPORINE (RESTASIS) 0.05 % ophthalmic emulsion, Place 1 drop into both eyes daily.  , Disp: , Rfl:  .  Eflornithine HCl (VANIQA) 13.9 % cream, Apply topically 2 (two) times daily with a meal., Disp: , Rfl:  .  folic acid (FOLVITE) 1 MG tablet, Take 2 tablets (2 mg total) by mouth daily., Disp: 180 tablet, Rfl: 3 .  hydroxychloroquine (PLAQUENIL) 200 MG tablet, Take 1 tablet (200 mg total) by mouth daily., Disp: 90 tablet, Rfl: 0 .  lidocaine (LIDODERM) 5 %, Place 1 patch onto the skin daily. Remove & Discard patch within 12 hours or as directed by MD (Patient not taking: Reported on 05/31/2018), Disp: 12 patch, Rfl: 0 .  methotrexate 2.5 MG tablet, Take 8 tablets (20 mg total) by mouth once a week., Disp: 96 tablet, Rfl: 0 .  mineral/vitamin supplement (MULTIGEN) 70  MG TABS tablet, Take 1 tablet (70 mg total) by mouth daily., Disp: 90 tablet, Rfl: 3 .  omeprazole (PRILOSEC) 20 MG capsule, Take 20 mg by mouth as needed., Disp: , Rfl:  .  predniSONE (DELTASONE) 5 MG tablet, Take 5 mg po for 4 days then 2.5 mg mg po x 4 days (Patient taking differently: as needed. Take 5 mg po for 4 days then 2.5 mg mg po x 4 days as needed), Disp: 6 tablet, Rfl: 0 .  SUMAtriptan (IMITREX) 50 MG tablet, TAKE ONE TABLET BY MOUTH AS NEEDED. MAY REPEAT IN 2 TO 4 HOURS IF NEEDED., Disp: 9 tablet, Rfl: 5 .  urea (CARMOL) 40 % CREA, APPLY  CREAM TOPICALLY EVERY DAY, Disp: 29 each, Rfl: 1  EXAM: BP Readings from Last 3 Encounters:  07/20/18 118/62  05/31/18 108/64  05/28/18 (!) 116/96    VITALS per patient if applicable: looks welll ocass dry cough upper airway   GENERAL: alert, oriented, appears well and in no acute distress but anxious   HEENT: atraumatic, conjunttiva clear, no obvious abnormalities on inspection of external nose and ears NECK: normal movements of the head and neck LUNGS: on inspection no signs of respiratory distress, breathing rate appears normal, no obvious gross SOB, gasping or wheezing CV: no obvious cyanosis MS: moves all visible extremities without noticeable abnormality PSYCH/NEURO: pleasant and cooperative, mild anxiety , speech and thought processing grossly intact Lab Results  Component Value Date   WBC 7.4 07/20/2018   HGB 14.6 07/20/2018   HCT 44.9 07/20/2018   PLT 263.0 07/20/2018   GLUCOSE 91 07/20/2018   CHOL 184 07/20/2018   TRIG 142.0 07/20/2018   HDL 73.30 07/20/2018   LDLDIRECT 97.3 05/25/2013   LDLCALC 82 07/20/2018   ALT 16 07/20/2018   AST 18 07/20/2018   NA 138 07/20/2018   K 4.4 07/20/2018   CL 103 07/20/2018   CREATININE 0.75 07/20/2018   BUN 12 07/20/2018   CO2 28 07/20/2018   TSH 3.43 07/20/2018   INR 1.6 (A) 10/20/2018   HGBA1C 6.0 07/20/2018    ASSESSMENT AND PLAN:  Discussed the following assessment and  plan:    ICD-10-CM   1. Low grade fever ? gone  99  R50.9   2. Cough minimal resp sx  R05    tested  pending low risk exposure  3. Rheumatoid arthritis of multiple sites with negative rheumatoid factor (HCC)  M06.09   4. Educated About Covid-19 Virus Infection  Z71.89   5. Anxiety  F41.9   6. Racing heart beat  R00.0    at night recnetly poss from illness minimal and axiety  keep cards appt virtual of in person when safe  7. High risk medication use  Z79.899    prob not covid and very low risk by exposure hx    Await testing   Advise proceed with cards eval  Virtual or other depending on status .   No alarm sx at this time for emergency eval  Anxiety probably adding to sx  Will refill med   Counseled.   Expectant management and discussion of plan and treatment with opportunity to ask questions and all were answered. The patient agreed with the plan and demonstrated an understanding of the instructions.   Advised to call back or seek an in-person evaluation if worsening  or having  further concerns .  I provided 25  minutes of non-face-to-face time during this encounter.   Shanon Ace, MD

## 2018-11-23 ENCOUNTER — Other Ambulatory Visit: Payer: Self-pay

## 2018-11-23 ENCOUNTER — Ambulatory Visit (INDEPENDENT_AMBULATORY_CARE_PROVIDER_SITE_OTHER): Payer: BC Managed Care – PPO | Admitting: Internal Medicine

## 2018-11-23 ENCOUNTER — Encounter: Payer: Self-pay | Admitting: Internal Medicine

## 2018-11-23 DIAGNOSIS — R509 Fever, unspecified: Secondary | ICD-10-CM

## 2018-11-23 DIAGNOSIS — R000 Tachycardia, unspecified: Secondary | ICD-10-CM

## 2018-11-23 DIAGNOSIS — M0609 Rheumatoid arthritis without rheumatoid factor, multiple sites: Secondary | ICD-10-CM | POA: Diagnosis not present

## 2018-11-23 DIAGNOSIS — F419 Anxiety disorder, unspecified: Secondary | ICD-10-CM

## 2018-11-23 DIAGNOSIS — Z79899 Other long term (current) drug therapy: Secondary | ICD-10-CM

## 2018-11-23 DIAGNOSIS — R05 Cough: Secondary | ICD-10-CM

## 2018-11-23 DIAGNOSIS — Z7189 Other specified counseling: Secondary | ICD-10-CM

## 2018-11-23 DIAGNOSIS — R059 Cough, unspecified: Secondary | ICD-10-CM

## 2018-11-23 MED ORDER — SUMATRIPTAN SUCCINATE 50 MG PO TABS: ORAL_TABLET | ORAL | 5 refills | Status: DC

## 2018-11-23 MED ORDER — ALPRAZOLAM 0.25 MG PO TABS: 0.2500 mg | ORAL_TABLET | Freq: Two times a day (BID) | ORAL | 0 refills | Status: DC | PRN

## 2018-11-23 MED ORDER — MULTIGEN 70 MG PO TABS: 1.0000 | ORAL_TABLET | Freq: Every day | ORAL | 3 refills | Status: DC

## 2018-11-24 ENCOUNTER — Ambulatory Visit: Payer: Self-pay | Admitting: Rheumatology

## 2018-11-25 ENCOUNTER — Ambulatory Visit (INDEPENDENT_AMBULATORY_CARE_PROVIDER_SITE_OTHER): Payer: BC Managed Care – PPO | Admitting: Psychology

## 2018-11-25 DIAGNOSIS — F411 Generalized anxiety disorder: Secondary | ICD-10-CM | POA: Diagnosis not present

## 2018-11-26 ENCOUNTER — Ambulatory Visit: Payer: BC Managed Care – PPO | Admitting: Interventional Cardiology

## 2018-12-01 ENCOUNTER — Ambulatory Visit: Payer: BC Managed Care – PPO

## 2018-12-06 ENCOUNTER — Ambulatory Visit (INDEPENDENT_AMBULATORY_CARE_PROVIDER_SITE_OTHER): Payer: BC Managed Care – PPO | Admitting: General Practice

## 2018-12-06 ENCOUNTER — Other Ambulatory Visit: Payer: Self-pay

## 2018-12-06 DIAGNOSIS — Z7901 Long term (current) use of anticoagulants: Secondary | ICD-10-CM

## 2018-12-06 DIAGNOSIS — R760 Raised antibody titer: Secondary | ICD-10-CM | POA: Diagnosis not present

## 2018-12-06 LAB — POCT INR: INR: 1.9 — AB (ref 2.0–3.0)

## 2018-12-06 NOTE — Patient Instructions (Addendum)
Pre visit review using our clinic review tool, if applicable. No additional management support is needed unless otherwise documented below in the visit note. Continue to take 2 tablets daily except take 1 tablet on Wed/Thursday/Sunday.  Re-check in 6 weeks.

## 2018-12-09 ENCOUNTER — Ambulatory Visit (INDEPENDENT_AMBULATORY_CARE_PROVIDER_SITE_OTHER): Payer: BC Managed Care – PPO | Admitting: Psychology

## 2018-12-09 DIAGNOSIS — F411 Generalized anxiety disorder: Secondary | ICD-10-CM

## 2018-12-09 NOTE — Progress Notes (Signed)
Office Visit Note  Patient: [name]             Date of Birth: 1960/10/01           MRN: [id number]             PCP: Burnis Medin, MD Referring: Burnis Medin, MD Visit Date: 12/22/2018 Occupation: @GUAROCC @  Subjective:  Mediation monitoring   History of Present Illness: [name] is a 58 y.o. female with history of seronegative rheumatoid arthritis, osteoarthritis, and osteoporosis.  She is taking Methotrexate 8 tablets po once weekly, folic acid 2 mg po daily, and Plaquenil 200 mg 1 tablet by mouth daily.  She states last week she had a flare in both wrist joints.  She states she too prednisone 5 mg for 2 days and 2.5 mg for 4 days, and the wrist joint pain and joint swelling resolved. She has been having worsening fatigue recently. She would like to postpone DEXA due to covid-19.   Activities of Daily Living:  Patient reports morning stiffness for 0 minutes.   Patient Denies nocturnal pain.  Difficulty dressing/grooming: Denies Difficulty climbing stairs: Denies Difficulty getting out of chair: Denies Difficulty using hands for taps, buttons, cutlery, and/or writing: Denies  Review of Systems  Constitutional: Positive for fatigue.  HENT: Positive for mouth dryness. Negative for mouth sores and nose dryness.   Eyes: Positive for dryness. Negative for pain, itching and visual disturbance.  Respiratory: Negative for cough, hemoptysis, shortness of breath, wheezing and difficulty breathing.   Cardiovascular: Negative for chest pain, palpitations, hypertension and swelling in legs/feet.  Gastrointestinal: Negative for abdominal pain, blood in stool, constipation and diarrhea.  Endocrine: Negative for increased urination.  Genitourinary: Negative for painful urination.  Musculoskeletal: Positive for arthralgias, joint pain and joint swelling. Negative for myalgias, muscle weakness, morning stiffness, muscle tenderness and myalgias.  Skin: Negative for color  change, pallor, rash, hair loss, nodules/bumps, redness, skin tightness, ulcers and sensitivity to sunlight.  Allergic/Immunologic: Negative for susceptible to infections.  Neurological: Negative for dizziness, light-headedness, numbness, headaches, memory loss and weakness.  Hematological: Negative for swollen glands.  Psychiatric/Behavioral: Negative for depressed mood, confusion and sleep disturbance. The patient is not nervous/anxious.     PMFS History:  Patient Active Problem List   Diagnosis Date Noted  . 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[place]) 01/06/2018  . Long term (current) use of anticoagulants 02/11/2017  . Sicca syndrome ([place]) 12/22/2016  . ANA positive 12/22/2016  . 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 Migraine 03/31/2016  . Anxiety 03/31/2016  . High risk medication use 03/31/2016  . Primary osteoarthritis of both feet 03/31/2016  . Primary osteoarthritis of both knees 03/31/2016  . Stiffness of left hand joint 11/27/2014  . Elevated IOP 06/09/2014  . Long term current use of systemic steroids 06/09/2014  . Medically complex patient 06/09/2014  . Current use of steroid medication 01/09/2014  . Xerosis of skin 12/07/2013  . Encounter for therapeutic drug monitoring 06/09/2013  . B12 deficiency 05/31/2013  . Iron deficiency 05/31/2013  . Visit for preventive health examination 05/25/2012  . Mammogram abnormal 04/12/2012  . 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 Vaginal atrophy 01/23/2012  . History of recurrent UTI (urinary tract infection) 01/23/2012  . Osteoporosis 04/29/2011  . Nonspecific abnormal results of liver function study 12/08/2010  . Chronic anticoagulation   . Anticoagulant long-term use 07/15/2010  . 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 Chronic cough 03/19/2009  . CHEST  PAIN-UNSPECIFIED 08/26/2008  . PALPITATIONS, RECURRENT 07/24/2008  . Vitamin D deficiency 05/30/2008  . HYPERLIPIDEMIA 05/30/2008  . IRON DEFICIENCY  05/30/2008  . Adjustment disorder with anxiety 05/30/2008  . Rheumatoid arthritis ([place]) 05/30/2008  . PULMONARY EMBOLISM, HX OF 05/30/2008    Past Medical History:  Diagnosis Date  . Adjustment disorder with anxiety   . B12 deficiency due to diet    is a vegetarian  . Chest pain, unspecified   . Chronic anticoagulation   . Headache(784.0)    migraines and head pain  . Hx of varicella    As Child  . Iron deficiency anemia, unspecified   . Otalgia, unspecified   . Other and unspecified hyperlipidemia   . Palpitations   . Personal history of venous thrombosis and embolism   . Recurrent UTI   . Rheumatoid arthritis(714.0)    deveshewar  . Unspecified vitamin D deficiency   . UTI (lower urinary tract infection) 11/28/2011   citrobacter       Family History  Problem Relation Age of Onset  . Dementia Father   . Stroke Father   . Hyperlipidemia Father   . Diabetes Father   . Parkinsonism Mother   . Lymphoma Other   . Colon cancer Neg Hx    Past Surgical History:  Procedure Laterality Date  . ORIF ULNAR FRACTURE    . TYMPANOSTOMY TUBE PLACEMENT  2009   Right   Social History   Social History Narrative   Regular Exercise-no   20 yrs in [place]   Son at [employer]   From [place].   Married HH  of 3    G1P1            Immunization History  Administered Date(s) Administered  . Influenza,inj,Quad PF,6+ Mos 04/19/2013, 06/27/2015, 03/05/2016, 02/11/2017, 03/15/2018  . PPD Test 07/21/2016  . Tdap 05/31/2013     Objective: Vital Signs: BP 96/68 (BP Location: Left Arm, Patient Position: Sitting, Cuff Size: Normal)   Pulse (!) 52   Resp 13   Ht 5' 3.5" (1.613 m)   Wt 157 lb (71.2 kg)   BMI 27.38 kg/m    Physical Exam Vitals signs and nursing note reviewed.  Constitutional:      Appearance: She is well-developed.  HENT:     Head: Normocephalic and atraumatic.  Eyes:     Conjunctiva/sclera: Conjunctivae normal.  Neck:     Musculoskeletal: Normal range of motion.   Cardiovascular:     Rate and Rhythm: Normal rate and regular rhythm.     Heart sounds: Normal heart sounds.  Pulmonary:     Effort: Pulmonary effort is normal.     Breath sounds: Normal breath sounds.  Abdominal:     General: Bowel sounds are normal.     Palpations: Abdomen is soft.  Lymphadenopathy:     Cervical: No cervical adenopathy.  Skin:    General: Skin is warm and dry.     Capillary Refill: Capillary refill takes less than 2 seconds.  Neurological:     Mental Status: She is alert and oriented to person, place, and time.  Psychiatric:        Behavior: Behavior normal.      Musculoskeletal Exam: C-spine, thoracic spine, and lumbar spine good ROM.  No midline spinal tenderness.  No SI joint tenderness.  Shoulder joints, elbow joints, wrist joints, MCPs, PIPs, DIPs good ROM with no synovitis.  Complete fist formation bilaterally. Hip joints, knee joints, ankle joints, MTPs, PIPs, and DIPs  good ROM with no synovitis.  No warmth or effusion of knee joints.  No tenderness or swelling of ankle joints.   CDAI Exam: CDAI Score: - Patient Global: -; Provider Global: - Swollen: -; Tender: - Joint Exam   No joint exam has been documented for this visit   There is currently no information documented on the homunculus. Go to the Rheumatology activity and complete the homunculus joint exam.  Investigation: No additional findings.  Imaging: No results found.  Recent Labs: Lab Results  Component Value Date   WBC 8.2 12/15/2018   HGB 14.5 12/15/2018   PLT 238 12/15/2018   NA 141 12/15/2018   K 4.4 12/15/2018   CL 106 12/15/2018   CO2 29 12/15/2018   GLUCOSE 98 12/15/2018   BUN 10 12/15/2018   CREATININE 0.77 12/15/2018   BILITOT 0.7 12/15/2018   ALKPHOS 104 07/20/2018   AST 16 12/15/2018   ALT 12 12/15/2018   PROT 7.1 12/15/2018   ALBUMIN 4.5 07/20/2018   CALCIUM 9.5 12/15/2018   GFRAA 97 12/15/2018    Speciality Comments: PLQ Eye Exam: 07/01/2018 WNL @ [facility] Follow up 1 year  Procedures:  No procedures performed Allergies: Boniva [ibandronate sodium], Ivp dye [iodinated diagnostic agents], Other, and Risedronate sodium [risedronate sodium]   Assessment / Plan:     Visit Diagnoses: Rheumatoid arthritis of multiple sites with negative rheumatoid factor (HCC) -  RF negative, anti-CCP positive, ANA positive: She has no synovitis on exam today.  She had a flare in both wrist joints last week, and she took prednisone 5 mg for 2 days and 2.5 mg for 4 days.  She has no tenderness or inflammation of the wrist joints at this time.  She has no joint pain or joint swelling at this time.  She is clinically doing well on Plaquenil 200 mg 1 tablet by mouth daily and methotrexate 8 tablets by mouth once weekly.  She will continue on this current treatment regimen.  She does not need any more aggressive treatment at this time.  We discussed the importance of holding methotrexate if she develops any signs or symptoms of an infection and to resume once the infection is completely cleared.  She was advised to notify us if shows increased joint pain or joint swelling.  She will follow-up in the office in 5 months.  High risk medication use - Plaquenil 200 mg by mouth daily and Methotrexate 8 tablets by mouth every week, folic acid 2 mg by mouth daily.  CBC and CMP were drawn on 12/15/2018.  She will return for lab work in October and every 3 months to monitor for drug toxicity.  Eye Exam: 06/25/17  Primary osteoarthritis of both knees - She has good range of motion with no discomfort.  No warmth or effusion was noted.  Primary osteoarthritis of both feet -She has no feet pain or joint swelling.  She wears proper fitting shoes.  Sicca syndrome ([place]) - She has chronic sicca symptoms.  She uses restasis 1 drop BID for symptomatic relief.   Age related osteoporosis, unspecified pathological fracture presence -She discontinued fosamax 70 mg po q week in May 2018 due to  worsening reflux, DEXA July 2018 left femur BMD 0.736 with the change of -0.3% T score of -2.5.  She would like to postpone scheduling DEXA until summer of 2021 due to the concern for being exposed to covid-19.    Other medical conditions are listed as follows:  Raised intraocular pressure of both eyes - Followed up by ophthalmologist.  History of hyperlipidemia   History of pulmonary embolism - She is on Warfarin    History of migraine   History of anemia   Orders: No orders of the defined types were placed in this encounter.  No orders of the defined types were placed in this encounter.   Face-to-face time spent with patient was 30 minutes. Greater than 50% of time was spent in counseling and coordination of care.  Follow-Up Instructions: Return in about 5 months (around 05/24/2019) for Rheumatoid arthritis, Osteoarthritis.   Ofilia Neas, PA-C   I examined and evaluated the patient with Hazel Sams PA.  Patient states that she had mild flare few days back and she took a small prednisone taper and responded to the treatment.  She had no synovitis on examination today.  She believes the flare could be related to doing some strenuous activity.  She will continue methotrexate and Plaquenil combination.  The plan of care was discussed as noted above.  Bo Merino, MD  Note - This record has been created using Editor, commissioning.  Chart creation errors have been sought, but may not always  have been located. Such creation errors do not reflect on  the standard of medical care.

## 2018-12-10 ENCOUNTER — Telehealth: Payer: Self-pay | Admitting: *Deleted

## 2018-12-10 NOTE — Telephone Encounter (Signed)
Pt has been screened for her upcoming appt 12/13/2018 and has answered no to the following covid19 prescreen questions below:        COVID-19 Pre-Screening Questions:  . In the past 7 to 10 days have you had a cough,  shortness of breath, headache, congestion, fever (100 or greater) body aches, chills, sore throat, or sudden loss of taste or sense of smell? . Have you been around anyone with known Covid 19. . Have you been around anyone who is awaiting Covid 19 test results in the past 7 to 10 days? . Have you been around anyone who has been exposed to Covid 19, or has mentioned symptoms of Covid 19 within the past 7 to 10 days?  If you have any concerns/questions about symptoms patients report during screening (either on the phone or at threshold). Contact the provider seeing the patient or DOD for further guidance.  If neither are available contact a member of the leadership team.

## 2018-12-11 NOTE — Progress Notes (Signed)
Cardiology Office Note:    Date:  12/13/2018   ID:  [name], DOB [birth year], MRN [id number]  PCP:  Burnis Medin, MD  Cardiologist:  No primary care provider on file.   Referring MD: Burnis Medin, MD   Chief Complaint  Patient presents with  . Chest Pain    History of Present Illness:    [name] is a 58 y.o. female with a hx of chronic recurring CP,  palpitations, h/o venous thromboembolism, anticoagulation, now referred for evaluation of left sided chest pain.  He was referred for cardiology reevaluation after developing "sharp left chest pain" in January.  A chest x-ray was done at an urgent care that revealed "heart enlargement".  The chest discomfort lasted 2 days and has completely resolved subsequently.  She has a longstanding prior history of non-ischemic/cardiac chest pain.  This particular episode of pain was worse than what she has had previously and different.  Since subsiding 6 months ago it has not recurred.  She denies shortness of breath.  Overall she feels well.  She does worry about her heart and whether or not rheumatoid arthritis could impact it.  Past Medical History:  Diagnosis Date  . Adjustment disorder with anxiety   . B12 deficiency due to diet    is a vegetarian  . Chest pain, unspecified   . Chronic anticoagulation   . Headache(784.0)    migraines and head pain  . Hx of varicella    As Child  . Iron deficiency anemia, unspecified   . Otalgia, unspecified   . Other and unspecified hyperlipidemia   . Palpitations   . Personal history of venous thrombosis and embolism   . Recurrent UTI   . Rheumatoid arthritis(714.0)    deveshewar  . Unspecified vitamin D deficiency   . UTI (lower urinary tract infection) 11/28/2011   citrobacter       Past Surgical History:  Procedure Laterality Date  . ORIF ULNAR FRACTURE    . TYMPANOSTOMY TUBE PLACEMENT  2009   Right    Current Medications: Current Meds  Medication Sig  .  ALPRAZolam (XANAX) 0.25 MG tablet Take 1 tablet (0.25 mg total) by mouth 2 (two) times daily as needed for anxiety.  . Calcium Carb-Cholecalciferol (CALTRATE 600+D) 600-800 MG-UNIT TABS Take by mouth.  . Cholecalciferol (VITAMIN D3) 2000 UNITS TABS Take 1 tablet by mouth daily.  Marland Kitchen COUMADIN 2.5 MG tablet Take 2 tablets daily except 1 tablet on Sun and Thurs or AS DIRECTED BY ANTICOAGULATION CLINIC  . cycloSPORINE (RESTASIS) 0.05 % ophthalmic emulsion Place 1 drop into both eyes daily.    . Eflornithine HCl (VANIQA) 13.9 % cream Apply topically 2 (two) times daily with a meal.  . folic acid (FOLVITE) 1 MG tablet Take 2 tablets (2 mg total) by mouth daily.  . hydroxychloroquine (PLAQUENIL) 200 MG tablet Take 1 tablet (200 mg total) by mouth daily.  . methotrexate 2.5 MG tablet Take 8 tablets (20 mg total) by mouth once a week.  . mineral/vitamin supplement (MULTIGEN) 70 MG TABS tablet Take 1 tablet (70 mg total) by mouth daily.  Marland Kitchen omeprazole (PRILOSEC) 20 MG capsule Take 20 mg by mouth as needed.  . SUMAtriptan (IMITREX) 50 MG tablet TAKE ONE TABLET BY MOUTH AS NEEDED. MAY REPEAT IN 2 TO 4 HOURS IF NEEDED.  Marland Kitchen urea (CARMOL) 40 % CREA APPLY  CREAM TOPICALLY EVERY DAY     Allergies:   Boniva [ibandronate sodium], Ivp dye [iodinated diagnostic  agents], Other, and Risedronate sodium [risedronate sodium]   Social History   Socioeconomic History  . Marital status: Married    Spouse name: Not on file  . Number of children: 1  . Years of education: Not on file  . Highest education level: Not on file  Occupational History  . Occupation: home maker  Social Needs  . Financial resource strain: Not on file  . Food insecurity    Worry: Not on file    Inability: Not on file  . Transportation needs    Medical: Not on file    Non-medical: Not on file  Tobacco Use  . Smoking status: Never Smoker  . Smokeless tobacco: Never Used  Substance and Sexual Activity  . Alcohol use: No  . Drug use: Never   . Sexual activity: Not on file  Lifestyle  . Physical activity    Days per week: Not on file    Minutes per session: Not on file  . Stress: Not on file  Relationships  . Social Herbalist on phone: Not on file    Gets together: Not on file    Attends religious service: Not on file    Active member of club or organization: Not on file    Attends meetings of clubs or organizations: Not on file    Relationship status: Not on file  Other Topics Concern  . Not on file  Social History Narrative   Regular Exercise-no   20 yrs in [place]   Son at [employer]   From [place].   Married HH  of 3    G1P1              Family History: [name] family history includes Dementia in her father; Diabetes in her father; Hyperlipidemia in her father; Lymphoma in an other family member; Parkinsonism in her mother; Stroke in her father. There is no history of Colon cancer.  ROS:   Please see the history of present illness.    She has rheumatoid arthritis, musculoskeletal concerns.  Also concerned that rheumatoid arthritis may cause her to have a heart problem.  All other systems reviewed and are negative.  EKGs/Labs/Other Studies Reviewed:    The following studies were reviewed today: 2D Doppler echocardiogram January 2020: Study Conclusions  - Left ventricle: The cavity size was normal. Systolic function was   normal. The estimated ejection fraction was in the range of 60%   to 65%. Wall motion was normal; there were no regional wall   motion abnormalities. Left ventricular diastolic function   parameters were normal. - Aortic valve: Transvalvular velocity was within the normal range.   There was no stenosis. There was no regurgitation. - Mitral valve: Transvalvular velocity was within the normal range.   There was no evidence for stenosis. There was trivial   regurgitation. - Right ventricle: The cavity size was normal. Wall thickness was   normal. Systolic function was normal.  - Atrial septum: No defect or patent foramen ovale was identified   by color flow Doppler. - Tricuspid valve: There was trivial regurgitation. - Pulmonary arteries: Systolic pressure was within the normal   range. PA peak pressure: 21 mm Hg (S). - Global longitudinal strain -22.1% (normal).   EKG:  EKG sinus bradycardia 55 bpm.  Otherwise normal.  Recent Labs: 07/20/2018: ALT 16; BUN 12; Creatinine, Ser 0.75; Hemoglobin 14.6; Platelets 263.0; Potassium 4.4; Sodium 138; TSH 3.43  Recent Lipid Panel    Component Value  Date/Time   CHOL 184 07/20/2018 1126   TRIG 142.0 07/20/2018 1126   HDL 73.30 07/20/2018 1126   CHOLHDL 3 07/20/2018 1126   VLDL 28.4 07/20/2018 1126   LDLCALC 82 07/20/2018 1126   LDLDIRECT 97.3 05/25/2013 0941    Physical Exam:    VS:  BP 112/68   Pulse (!) 59   Ht 5\' 3"  (1.6 m)   Wt 156 lb 9.6 oz (71 kg)   SpO2 99%   BMI 27.74 kg/m     Wt Readings from Last 3 Encounters:  12/13/18 156 lb 9.6 oz (71 kg)  07/20/18 166 lb 9.6 oz (75.6 kg)  05/31/18 164 lb 9.6 oz (74.7 kg)     GEN: Mast.  Good skin color. No acute distress HEENT: Normal NECK: No JVD. LYMPHATICS: No lymphadenopathy CARDIAC:  RRR without murmur, gallop, or edema. VASCULAR:  Normal Pulses. No bruits. RESPIRATORY:  Clear to auscultation without rales, wheezing or rhonchi  ABDOMEN: Soft, non-tender, non-distended, No pulsatile mass, MUSCULOSKELETAL: No deformity  SKIN: Warm and dry NEUROLOGIC:  Alert and oriented x 3 PSYCHIATRIC:  Normal affect   ASSESSMENT:    1. Precordial pain   2. PALPITATIONS, RECURRENT   3. Anticoagulant long-term use   4. Chronic anticoagulation    PLAN:    In order of problems listed above:  1. Resolved in January and has not recurred.  Likely musculoskeletal.  Relatively recent non-cardiac chest CT did not demonstrate any coronary calcification.  EKG other than sinus bradycardia is normal.  Echocardiogram demonstrates normal LV size and function.  Normal  right heart as well.  Clinical observation with no further testing at this time. 2. Feels that her heart beats hard some mornings when she awakens but when she checks her heart rate it is within the normal range. 3. Has history of pulmonary emboli and is on chronic Coumadin therapy.  Evaluation is felt indicated at this time.  Clinical follow-up as needed if symptoms develop.   Greater than 50% of the time during this office visit was spent in education, counseling, and coordination of care related to underlying disease process and testing as outlined.  Medication Adjustments/Labs and Tests Ordered: Current medicines are reviewed at length with the patient today.  Concerns regarding medicines are outlined above.  No orders of the defined types were placed in this encounter.  No orders of the defined types were placed in this encounter.   There are no Patient Instructions on file for this visit.   Signed, Sinclair Grooms, MD  12/13/2018 3:45 PM    [place]

## 2018-12-13 ENCOUNTER — Other Ambulatory Visit: Payer: Self-pay

## 2018-12-13 ENCOUNTER — Ambulatory Visit (INDEPENDENT_AMBULATORY_CARE_PROVIDER_SITE_OTHER): Payer: BC Managed Care – PPO | Admitting: Interventional Cardiology

## 2018-12-13 ENCOUNTER — Encounter: Payer: Self-pay | Admitting: Interventional Cardiology

## 2018-12-13 VITALS — BP 112/68 | HR 59 | Ht 63.0 in | Wt 156.6 lb

## 2018-12-13 DIAGNOSIS — Z7901 Long term (current) use of anticoagulants: Secondary | ICD-10-CM | POA: Diagnosis not present

## 2018-12-13 DIAGNOSIS — R072 Precordial pain: Secondary | ICD-10-CM

## 2018-12-13 DIAGNOSIS — R002 Palpitations: Secondary | ICD-10-CM

## 2018-12-13 NOTE — Patient Instructions (Signed)
Medication Instructions:  Your physician recommends that you continue on your current medications as directed. Please refer to the Current Medication list given to you today.  If you need a refill on your cardiac medications before your next appointment, please call your pharmacy.   Lab work: None If you have labs (blood work) drawn today and your tests are completely normal, you will receive your results only by: . MyChart Message (if you have MyChart) OR . A paper copy in the mail If you have any lab test that is abnormal or we need to change your treatment, we will call you to review the results.  Testing/Procedures: None  Follow-Up: Your physician recommends that you schedule a follow-up appointment as needed with Dr. Smith.   Any Other Special Instructions Will Be Listed Below (If Applicable).    

## 2018-12-14 ENCOUNTER — Telehealth: Payer: Self-pay | Admitting: Rheumatology

## 2018-12-14 DIAGNOSIS — M0609 Rheumatoid arthritis without rheumatoid factor, multiple sites: Secondary | ICD-10-CM

## 2018-12-14 NOTE — Telephone Encounter (Signed)
Patient going to come in tomorrow for labs. Patient would like to know if RA Factor, CCP, and Vit D needs to be added to orders. Please add to orders, if doctor wants to include them.

## 2018-12-14 NOTE — Telephone Encounter (Signed)
Vitamin D was normal in March I do not see any reason to repeat that.  We can add rheumatoid factor and anti-CCP.

## 2018-12-14 NOTE — Telephone Encounter (Signed)
Patient advised we have added a CCP and RF.

## 2018-12-15 ENCOUNTER — Other Ambulatory Visit: Payer: Self-pay

## 2018-12-15 DIAGNOSIS — Z79899 Other long term (current) drug therapy: Secondary | ICD-10-CM

## 2018-12-15 DIAGNOSIS — M0609 Rheumatoid arthritis without rheumatoid factor, multiple sites: Secondary | ICD-10-CM

## 2018-12-16 LAB — CBC WITH DIFFERENTIAL/PLATELET
Absolute Monocytes: 640 cells/uL (ref 200–950)
Basophils Absolute: 49 cells/uL (ref 0–200)
Basophils Relative: 0.6 %
Eosinophils Absolute: 107 cells/uL (ref 15–500)
Eosinophils Relative: 1.3 %
HCT: 44.2 % (ref 35.0–45.0)
Hemoglobin: 14.5 g/dL (ref 11.7–15.5)
Lymphs Abs: 2599 cells/uL (ref 850–3900)
MCH: 28 pg (ref 27.0–33.0)
MCHC: 32.8 g/dL (ref 32.0–36.0)
MCV: 85.3 fL (ref 80.0–100.0)
MPV: 11.8 fL (ref 7.5–12.5)
Monocytes Relative: 7.8 %
Neutro Abs: 4805 cells/uL (ref 1500–7800)
Neutrophils Relative %: 58.6 %
Platelets: 238 10*3/uL (ref 140–400)
RBC: 5.18 10*6/uL — ABNORMAL HIGH (ref 3.80–5.10)
RDW: 12.8 % (ref 11.0–15.0)
Total Lymphocyte: 31.7 %
WBC: 8.2 10*3/uL (ref 3.8–10.8)

## 2018-12-16 LAB — COMPLETE METABOLIC PANEL WITH GFR
AG Ratio: 1.4 (calc) (ref 1.0–2.5)
ALT: 12 U/L (ref 6–29)
AST: 16 U/L (ref 10–35)
Albumin: 4.2 g/dL (ref 3.6–5.1)
Alkaline phosphatase (APISO): 84 U/L (ref 37–153)
BUN: 10 mg/dL (ref 7–25)
CO2: 29 mmol/L (ref 20–32)
Calcium: 9.5 mg/dL (ref 8.6–10.4)
Chloride: 106 mmol/L (ref 98–110)
Creat: 0.77 mg/dL (ref 0.50–0.99)
GFR, Est African American: 97 mL/min/{1.73_m2} (ref >=60)
GFR, Est Non African American: 84 mL/min/{1.73_m2} (ref >=60)
Globulin: 2.9 g/dL (calc) (ref 1.9–3.7)
Glucose, Bld: 98 mg/dL (ref 65–99)
Potassium: 4.4 mmol/L (ref 3.5–5.3)
Sodium: 141 mmol/L (ref 135–146)
Total Bilirubin: 0.7 mg/dL (ref 0.2–1.2)
Total Protein: 7.1 g/dL (ref 6.1–8.1)

## 2018-12-16 LAB — RHEUMATOID FACTOR: Rheumatoid fact SerPl-aCnc: 47 IU/mL — ABNORMAL HIGH (ref <14)

## 2018-12-16 LAB — CYCLIC CITRUL PEPTIDE ANTIBODY, IGG: Cyclic Citrullin Peptide Ab: >250 UNITS — ABNORMAL HIGH

## 2018-12-16 NOTE — Progress Notes (Signed)
Labs are stable.  I will discuss results at length at the follow-up visit.

## 2018-12-17 ENCOUNTER — Other Ambulatory Visit: Payer: Self-pay | Admitting: Rheumatology

## 2018-12-17 DIAGNOSIS — M0609 Rheumatoid arthritis without rheumatoid factor, multiple sites: Secondary | ICD-10-CM

## 2018-12-17 NOTE — Telephone Encounter (Signed)
Last Visit: 04/07/2018 Next Visit: 11/24/2018 Labs: 12/15/18 stable PLQ Eye Exam: 06/25/17 WNL   Patient advised she is due for PLQ eye exam. Patient states she has had that performed and will send copy through my chart.   Okay to refill per Dr. Estanislado Pandy

## 2018-12-19 ENCOUNTER — Encounter: Payer: Self-pay | Admitting: Rheumatology

## 2018-12-22 ENCOUNTER — Encounter: Payer: Self-pay | Admitting: Rheumatology

## 2018-12-22 ENCOUNTER — Ambulatory Visit (INDEPENDENT_AMBULATORY_CARE_PROVIDER_SITE_OTHER): Payer: BC Managed Care – PPO | Admitting: Rheumatology

## 2018-12-22 ENCOUNTER — Other Ambulatory Visit: Payer: Self-pay

## 2018-12-22 VITALS — BP 96/68 | HR 52 | Resp 13 | Ht 63.5 in | Wt 157.0 lb

## 2018-12-22 DIAGNOSIS — Z86711 Personal history of pulmonary embolism: Secondary | ICD-10-CM

## 2018-12-22 DIAGNOSIS — H40053 Ocular hypertension, bilateral: Secondary | ICD-10-CM

## 2018-12-22 DIAGNOSIS — Z79899 Other long term (current) drug therapy: Secondary | ICD-10-CM | POA: Diagnosis not present

## 2018-12-22 DIAGNOSIS — Z862 Personal history of diseases of the blood and blood-forming organs and certain disorders involving the immune mechanism: Secondary | ICD-10-CM

## 2018-12-22 DIAGNOSIS — M170 Bilateral primary osteoarthritis of knee: Secondary | ICD-10-CM

## 2018-12-22 DIAGNOSIS — M19071 Primary osteoarthritis, right ankle and foot: Secondary | ICD-10-CM

## 2018-12-22 DIAGNOSIS — M0609 Rheumatoid arthritis without rheumatoid factor, multiple sites: Secondary | ICD-10-CM

## 2018-12-22 DIAGNOSIS — Z8639 Personal history of other endocrine, nutritional and metabolic disease: Secondary | ICD-10-CM

## 2018-12-22 DIAGNOSIS — M19072 Primary osteoarthritis, left ankle and foot: Secondary | ICD-10-CM

## 2018-12-22 DIAGNOSIS — M3500 Sicca syndrome, unspecified: Secondary | ICD-10-CM

## 2018-12-22 DIAGNOSIS — M810 Age-related osteoporosis without current pathological fracture: Secondary | ICD-10-CM

## 2018-12-22 DIAGNOSIS — Z8669 Personal history of other diseases of the nervous system and sense organs: Secondary | ICD-10-CM

## 2018-12-22 MED ORDER — PREDNISONE 5 MG PO TABS: ORAL_TABLET | ORAL | 0 refills | Status: DC

## 2018-12-22 NOTE — Patient Instructions (Signed)
Standing Labs We placed an order today for your standing lab work.    Please come back and get your standing labs in October and every 3 months   We have open lab daily Monday through Thursday from 8:30-12:30 PM and 1:30-4:30 PM and Friday from 8:30-12:30 PM and 1:30 -4:00 PM at the office of Dr. Shaili Deveshwar.   You may experience shorter wait times on Monday and Friday afternoons. The office is located at [address], [address], [place], [place] [address] No appointment is necessary.   Labs are drawn by [facility].  You may receive a bill from [facility] for your lab work.  If you wish to have your labs drawn at another location, please call the office 24 hours in advance to send orders.  If you have any questions regarding directions or hours of operation,  please call [phone].   Just as a reminder please drink plenty of water prior to coming for your lab work. Thanks!   

## 2018-12-23 ENCOUNTER — Ambulatory Visit (INDEPENDENT_AMBULATORY_CARE_PROVIDER_SITE_OTHER): Payer: BC Managed Care – PPO | Admitting: Psychology

## 2018-12-23 DIAGNOSIS — F411 Generalized anxiety disorder: Secondary | ICD-10-CM | POA: Diagnosis not present

## 2019-01-06 ENCOUNTER — Ambulatory Visit (INDEPENDENT_AMBULATORY_CARE_PROVIDER_SITE_OTHER): Payer: BC Managed Care – PPO | Admitting: Psychology

## 2019-01-06 DIAGNOSIS — F411 Generalized anxiety disorder: Secondary | ICD-10-CM

## 2019-01-19 ENCOUNTER — Ambulatory Visit (INDEPENDENT_AMBULATORY_CARE_PROVIDER_SITE_OTHER): Payer: BC Managed Care – PPO | Admitting: General Practice

## 2019-01-19 ENCOUNTER — Other Ambulatory Visit: Payer: Self-pay

## 2019-01-19 DIAGNOSIS — Z7901 Long term (current) use of anticoagulants: Secondary | ICD-10-CM | POA: Diagnosis not present

## 2019-01-19 DIAGNOSIS — R760 Raised antibody titer: Secondary | ICD-10-CM

## 2019-01-19 LAB — POCT INR: INR: 1.8 — AB (ref 2.0–3.0)

## 2019-01-19 NOTE — Patient Instructions (Signed)
Pre visit review using our clinic review tool, if applicable. No additional management support is needed unless otherwise documented below in the visit note.  Continue to take 2 tablets daily except take 1 tablet on Wed/Thursday/Sunday.  Re-check in 6 weeks.  

## 2019-01-20 ENCOUNTER — Ambulatory Visit (INDEPENDENT_AMBULATORY_CARE_PROVIDER_SITE_OTHER): Payer: BC Managed Care – PPO | Admitting: Psychology

## 2019-01-20 DIAGNOSIS — F411 Generalized anxiety disorder: Secondary | ICD-10-CM | POA: Diagnosis not present

## 2019-01-25 ENCOUNTER — Other Ambulatory Visit: Payer: Self-pay | Admitting: Rheumatology

## 2019-01-25 DIAGNOSIS — M0609 Rheumatoid arthritis without rheumatoid factor, multiple sites: Secondary | ICD-10-CM

## 2019-01-26 ENCOUNTER — Other Ambulatory Visit: Payer: Self-pay | Admitting: Rheumatology

## 2019-01-26 NOTE — Telephone Encounter (Signed)
Last Visit: 12/22/18 Next Visit: 06/01/19 Labs: 12/15/18 stable  Okay to refill per Dr. Estanislado Pandy

## 2019-02-03 ENCOUNTER — Ambulatory Visit (INDEPENDENT_AMBULATORY_CARE_PROVIDER_SITE_OTHER): Payer: BC Managed Care – PPO | Admitting: Psychology

## 2019-02-03 DIAGNOSIS — F411 Generalized anxiety disorder: Secondary | ICD-10-CM | POA: Diagnosis not present

## 2019-02-17 ENCOUNTER — Ambulatory Visit: Payer: BC Managed Care – PPO | Admitting: Psychology

## 2019-02-23 ENCOUNTER — Ambulatory Visit (INDEPENDENT_AMBULATORY_CARE_PROVIDER_SITE_OTHER): Payer: BC Managed Care – PPO | Admitting: General Practice

## 2019-02-23 ENCOUNTER — Other Ambulatory Visit: Payer: Self-pay

## 2019-02-23 DIAGNOSIS — R760 Raised antibody titer: Secondary | ICD-10-CM

## 2019-02-23 DIAGNOSIS — Z23 Encounter for immunization: Secondary | ICD-10-CM

## 2019-02-23 DIAGNOSIS — Z7901 Long term (current) use of anticoagulants: Secondary | ICD-10-CM

## 2019-02-23 LAB — POCT INR: INR: 1.6 — AB (ref 2.0–3.0)

## 2019-02-23 NOTE — Patient Instructions (Signed)
Pre visit review using our clinic review tool, if applicable. No additional management support is needed unless otherwise documented below in the visit note.  Take 2 tablets today and then continue to take 2 tablets daily except take 1 tablet on Wed/Thursday/Sunday.  Re-check in 6 weeks.

## 2019-03-02 ENCOUNTER — Ambulatory Visit: Payer: BC Managed Care – PPO

## 2019-03-03 ENCOUNTER — Ambulatory Visit (INDEPENDENT_AMBULATORY_CARE_PROVIDER_SITE_OTHER): Payer: BC Managed Care – PPO | Admitting: Psychology

## 2019-03-03 DIAGNOSIS — F411 Generalized anxiety disorder: Secondary | ICD-10-CM

## 2019-03-11 ENCOUNTER — Other Ambulatory Visit: Payer: Self-pay | Admitting: Rheumatology

## 2019-03-11 DIAGNOSIS — M0609 Rheumatoid arthritis without rheumatoid factor, multiple sites: Secondary | ICD-10-CM

## 2019-03-14 MED ORDER — HYDROXYCHLOROQUINE SULFATE 200 MG PO TABS: 200.0000 mg | ORAL_TABLET | Freq: Every day | ORAL | 0 refills | Status: DC

## 2019-03-14 NOTE — Telephone Encounter (Signed)
Last Visit: 12/22/18 Next Visit: 06/01/19 Labs: 12/15/18 stable PLQ Eye Exam: 07/01/2018 WNL  Okay to refill per Dr. Estanislado Pandy

## 2019-03-16 ENCOUNTER — Telehealth: Payer: Self-pay

## 2019-03-16 NOTE — Telephone Encounter (Signed)
Copied from [place] [id number]. Topic: General - Other >> Mar 16, 2019 10:48 AM Carolyn Stare wrote: Pt has a question about her Coumdian med and would like a call back, what she take is not available anymore    Please advise patient states that they are wanting to give her generic

## 2019-03-17 ENCOUNTER — Ambulatory Visit: Payer: BC Managed Care – PPO | Admitting: Psychology

## 2019-03-17 ENCOUNTER — Other Ambulatory Visit: Payer: Self-pay | Admitting: General Practice

## 2019-03-17 DIAGNOSIS — Z7901 Long term (current) use of anticoagulants: Secondary | ICD-10-CM

## 2019-03-17 MED ORDER — WARFARIN SODIUM 2.5 MG PO TABS: ORAL_TABLET | ORAL | 1 refills | Status: DC

## 2019-03-31 ENCOUNTER — Ambulatory Visit (INDEPENDENT_AMBULATORY_CARE_PROVIDER_SITE_OTHER): Payer: BC Managed Care – PPO | Admitting: Psychology

## 2019-03-31 DIAGNOSIS — F411 Generalized anxiety disorder: Secondary | ICD-10-CM | POA: Diagnosis not present

## 2019-04-06 ENCOUNTER — Ambulatory Visit (INDEPENDENT_AMBULATORY_CARE_PROVIDER_SITE_OTHER): Payer: BC Managed Care – PPO | Admitting: General Practice

## 2019-04-06 ENCOUNTER — Other Ambulatory Visit: Payer: Self-pay

## 2019-04-06 DIAGNOSIS — Z7901 Long term (current) use of anticoagulants: Secondary | ICD-10-CM

## 2019-04-06 DIAGNOSIS — R760 Raised antibody titer: Secondary | ICD-10-CM

## 2019-04-06 LAB — POCT INR: INR: 1.4 — AB (ref 2.0–3.0)

## 2019-04-06 NOTE — Patient Instructions (Addendum)
Pre visit review using our clinic review tool, if applicable. No additional management support is needed unless otherwise documented below in the visit note.  Take 2 tablets today and then change dosage and take 2 tablets daily except take 1 tablet on Sun and Thurs. Re-check in 6 weeks.

## 2019-04-08 ENCOUNTER — Other Ambulatory Visit: Payer: Self-pay

## 2019-04-08 DIAGNOSIS — Z79899 Other long term (current) drug therapy: Secondary | ICD-10-CM

## 2019-04-09 LAB — CBC WITH DIFFERENTIAL/PLATELET
Absolute Monocytes: 680 cells/uL (ref 200–950)
Basophils Absolute: 51 cells/uL (ref 0–200)
Basophils Relative: 0.6 %
Eosinophils Absolute: 111 cells/uL (ref 15–500)
Eosinophils Relative: 1.3 %
HCT: 44.3 % (ref 35.0–45.0)
Hemoglobin: 14.5 g/dL (ref 11.7–15.5)
Lymphs Abs: 2669 cells/uL (ref 850–3900)
MCH: 27.8 pg (ref 27.0–33.0)
MCHC: 32.7 g/dL (ref 32.0–36.0)
MCV: 84.9 fL (ref 80.0–100.0)
MPV: 11.6 fL (ref 7.5–12.5)
Monocytes Relative: 8 %
Neutro Abs: 4990 cells/uL (ref 1500–7800)
Neutrophils Relative %: 58.7 %
Platelets: 284 10*3/uL (ref 140–400)
RBC: 5.22 10*6/uL — ABNORMAL HIGH (ref 3.80–5.10)
RDW: 12.8 % (ref 11.0–15.0)
Total Lymphocyte: 31.4 %
WBC: 8.5 10*3/uL (ref 3.8–10.8)

## 2019-04-09 LAB — COMPLETE METABOLIC PANEL WITH GFR
AG Ratio: 1.4 (calc) (ref 1.0–2.5)
ALT: 12 U/L (ref 6–29)
AST: 16 U/L (ref 10–35)
Albumin: 4.1 g/dL (ref 3.6–5.1)
Alkaline phosphatase (APISO): 86 U/L (ref 37–153)
BUN: 14 mg/dL (ref 7–25)
CO2: 23 mmol/L (ref 20–32)
Calcium: 9.1 mg/dL (ref 8.6–10.4)
Chloride: 106 mmol/L (ref 98–110)
Creat: 0.82 mg/dL (ref 0.50–0.99)
GFR, Est African American: 90 mL/min/{1.73_m2} (ref >=60)
GFR, Est Non African American: 78 mL/min/{1.73_m2} (ref >=60)
Globulin: 3 g/dL (calc) (ref 1.9–3.7)
Glucose, Bld: 96 mg/dL (ref 65–99)
Potassium: 4.5 mmol/L (ref 3.5–5.3)
Sodium: 141 mmol/L (ref 135–146)
Total Bilirubin: 0.6 mg/dL (ref 0.2–1.2)
Total Protein: 7.1 g/dL (ref 6.1–8.1)

## 2019-04-13 ENCOUNTER — Encounter: Payer: Self-pay | Admitting: Internal Medicine

## 2019-04-13 ENCOUNTER — Other Ambulatory Visit: Payer: Self-pay

## 2019-04-13 ENCOUNTER — Telehealth (INDEPENDENT_AMBULATORY_CARE_PROVIDER_SITE_OTHER): Payer: BC Managed Care – PPO | Admitting: Internal Medicine

## 2019-04-13 DIAGNOSIS — Z79899 Other long term (current) drug therapy: Secondary | ICD-10-CM

## 2019-04-13 DIAGNOSIS — Z7901 Long term (current) use of anticoagulants: Secondary | ICD-10-CM

## 2019-04-13 DIAGNOSIS — R350 Frequency of micturition: Secondary | ICD-10-CM | POA: Diagnosis not present

## 2019-04-13 DIAGNOSIS — Z87440 Personal history of urinary (tract) infections: Secondary | ICD-10-CM

## 2019-04-13 LAB — POC URINALSYSI DIPSTICK (AUTOMATED)
Glucose, UA: NEGATIVE
Leukocytes, UA: NEGATIVE
Protein, UA: NEGATIVE
Spec Grav, UA: 1.02 (ref 1.010–1.025)
Urobilinogen, UA: 0.2 E.U./dL
pH, UA: 6 (ref 5.0–8.0)

## 2019-04-13 MED ORDER — CIPROFLOXACIN HCL 500 MG PO TABS: 500.0000 mg | ORAL_TABLET | Freq: Two times a day (BID) | ORAL | 0 refills | Status: DC

## 2019-04-13 NOTE — Telephone Encounter (Signed)
Pt has been made appt  

## 2019-04-13 NOTE — Progress Notes (Signed)
Virtual Visit via Video Note  I connected with@ on 04/13/19 at 10:30 AM EST by a video enabled telemedicine application and verified that I am speaking with the correct person using two identifiers. Location patient: home Location provider:work office Persons participating in the virtual visit: patient, provider  [place] national recommendations  regarding COVID 19 pandemic   video visit is advised over in office visit for this patient.  Patient aware  of the limitations of evaluation and management by telemedicine and  availability of in person appointments. and agreed to proceed.   HPI: [name] presents for video visit she has hx of uti last July 2019  And yesterday a twinge of pain  [name] ok but now urinary frequency but no burning  Not sure if could be getting a uti   And wants to be proactive over hte holiday to avoid an UC visit .  No fever not sure this is uti  Has used cipro in past the best as has  Had issues with some other antibiotics . Is on low dose coumadin soon to be changed to generic  hasnt been out at all avoiding any possible covid  Contact and doesn want to use our office  Bathroom collection ROS: See pertinent positives and negatives per HPI.  Past Medical History:  Diagnosis Date  . Adjustment disorder with anxiety   . B12 deficiency due to diet    is a vegetarian  . Chest pain, unspecified   . Chronic anticoagulation   . Headache(784.0)    migraines and head pain  . Hx of varicella    As Child  . Iron deficiency anemia, unspecified   . Otalgia, unspecified   . Other and unspecified hyperlipidemia   . Palpitations   . Personal history of venous thrombosis and embolism   . Recurrent UTI   . Rheumatoid arthritis(714.0)    deveshewar  . Unspecified vitamin D deficiency   . UTI (lower urinary tract infection) 11/28/2011   citrobacter       Past Surgical History:  Procedure Laterality Date  . ORIF ULNAR FRACTURE    . TYMPANOSTOMY TUBE PLACEMENT   2009   Right    Family History  Problem Relation Age of Onset  . Dementia Father   . Stroke Father   . Hyperlipidemia Father   . Diabetes Father   . Parkinsonism Mother   . Lymphoma Other   . Colon cancer Neg Hx     Social History   Tobacco Use  . Smoking status: Never Smoker  . Smokeless tobacco: Never Used  Substance Use Topics  . Alcohol use: No  . Drug use: Never      Current Outpatient Medications:  .  ALPRAZolam (XANAX) 0.25 MG tablet, Take 1 tablet (0.25 mg total) by mouth 2 (two) times daily as needed for anxiety., Disp: 60 tablet, Rfl: 0 .  Calcium Carb-Cholecalciferol (CALTRATE 600+D) 600-800 MG-UNIT TABS, Take by mouth., Disp: , Rfl:  .  Cholecalciferol (VITAMIN D3) 2000 UNITS TABS, Take 1 tablet by mouth daily., Disp: , Rfl:  .  ciprofloxacin (CIPRO) 500 MG tablet, Take 1 tablet (500 mg total) by mouth 2 (two) times daily. For UTI, Disp: 14 tablet, Rfl: 0 .  cycloSPORINE (RESTASIS) 0.05 % ophthalmic emulsion, Place 1 drop into both eyes daily.  , Disp: , Rfl:  .  Eflornithine HCl (VANIQA) 13.9 % cream, Apply topically 2 (two) times daily with a meal., Disp: , Rfl:  .  folic acid (FOLVITE)  1 MG tablet, Take 2 tablets (2 mg total) by mouth daily., Disp: 180 tablet, Rfl: 3 .  hydroxychloroquine (PLAQUENIL) 200 MG tablet, Take 1 tablet (200 mg total) by mouth daily., Disp: 90 tablet, Rfl: 0 .  methotrexate (RHEUMATREX) 2.5 MG tablet, TAKE 8 TABLETS BY MOUTH ONCE WEEKLY, Disp: 96 tablet, Rfl: 0 .  mineral/vitamin supplement (MULTIGEN) 70 MG TABS tablet, Take 1 tablet (70 mg total) by mouth daily., Disp: 90 tablet, Rfl: 3 .  omeprazole (PRILOSEC) 20 MG capsule, Take 20 mg by mouth as needed., Disp: , Rfl:  .  predniSONE (DELTASONE) 5 MG tablet, Take 1 tablet by mouth daily with breakfast, Disp: 12 tablet, Rfl: 0 .  SUMAtriptan (IMITREX) 50 MG tablet, TAKE ONE TABLET BY MOUTH AS NEEDED. MAY REPEAT IN 2 TO 4 HOURS IF NEEDED., Disp: 9 tablet, Rfl: 5 .  warfarin (COUMADIN)  2.5 MG tablet, Take 2 tablets daily except 1 tablet on Mon and Thurs or TAKE AS DIRECTED BY ANTICOAGULATION CLINIC, Disp: 180 tablet, Rfl: 1  EXAM: BP Readings from Last 3 Encounters:  12/22/18 96/68  12/13/18 112/68  07/20/18 118/62    VITALS per patient if applicable:  GENERAL: alert, oriented, appears well and in no acute distress  HEENT: atraumatic, conjunttiva clear, no obvious abnormalities on inspection of external nose and ears  NECK: normal movements of the head and neck  LUNGS: on inspection no signs of respiratory distress, breathing rate appears normal, no obvious gross SOB, gasping or wheezing  CV: no obvious cyanosis  MS: moves all visible extremities without noticeable abnormality  PSYCH/NEURO: pleasant and cooperative, no obvious depression or anxiety, speech and thought processing grossly intact Lab Results  Component Value Date   WBC 8.5 04/08/2019   HGB 14.5 04/08/2019   HCT 44.3 04/08/2019   PLT 284 04/08/2019   GLUCOSE 96 04/08/2019   CHOL 184 07/20/2018   TRIG 142.0 07/20/2018   HDL 73.30 07/20/2018   LDLDIRECT 97.3 05/25/2013   LDLCALC 82 07/20/2018   ALT 12 04/08/2019   AST 16 04/08/2019   NA 141 04/08/2019   K 4.5 04/08/2019   CL 106 04/08/2019   CREATININE 0.82 04/08/2019   BUN 14 04/08/2019   CO2 23 04/08/2019   TSH 3.43 07/20/2018   INR 1.4 (A) 04/06/2019   HGBA1C 6.0 07/20/2018    ASSESSMENT AND PLAN:  Discussed the following assessment and plan:    ICD-10-CM   1. Urinary frequency  R35.0 POCT Urinalysis Dipstick (Automated)    Urine Culture  2. History of recurrent UTI (urinary tract infection)  Z87.440 POCT Urinalysis Dipstick (Automated)    Urine Culture  3. Long term (current) use of anticoagulants  Z79.01   4. High risk medication use  Z79.899    Will come to get sterile cup and container and then get [place] specimen to lab  Will send in cipro in case needed over th holiday but will not use unless sx get worse or as  Needed   She requests  7 days in case  But will stop at 5 if  Has to begin and is better  Can disc coumadin dose change  If needed but if only taking 5 days and at low  Goal inr then may not need to change coumadin dosing  Can check with CB and protochol.  Counseled.   Expectant management and discussion of plan and treatment with opportunity to ask questions and all were answered. The patient agreed with the plan and demonstrated  an understanding of the instructions.   Advised to call back or seek an in-person evaluation if worsening  or having  further concerns . Return if symptoms worsen or fail to improve, for dependoing on results  as  above .    Shanon Ace, MD

## 2019-04-15 LAB — URINE CULTURE
MICRO NUMBER:: 1139456
Result:: NO GROWTH
SPECIMEN QUALITY:: ADEQUATE

## 2019-04-23 ENCOUNTER — Other Ambulatory Visit: Payer: Self-pay | Admitting: Rheumatology

## 2019-04-25 NOTE — Telephone Encounter (Signed)
Last Visit: 12/22/2018 Next Visit: 06/01/2019 Labs: 04/08/2019 CMP WNL. RBC count is elevated but stable. Rest of CBC WNL.  Okay to refill per Dr. Estanislado Pandy.

## 2019-04-28 ENCOUNTER — Ambulatory Visit: Payer: BC Managed Care – PPO | Admitting: Psychology

## 2019-04-28 ENCOUNTER — Other Ambulatory Visit: Payer: Self-pay | Admitting: Rheumatology

## 2019-04-28 DIAGNOSIS — M0609 Rheumatoid arthritis without rheumatoid factor, multiple sites: Secondary | ICD-10-CM

## 2019-04-29 ENCOUNTER — Other Ambulatory Visit: Payer: Self-pay

## 2019-04-29 MED ORDER — METHOTREXATE 2.5 MG PO TABS: 20.0000 mg | ORAL_TABLET | ORAL | 0 refills | Status: DC

## 2019-04-29 NOTE — Progress Notes (Signed)
Pharmacy did not receive rx, will resend MTX.

## 2019-05-12 ENCOUNTER — Ambulatory Visit: Payer: BC Managed Care – PPO | Admitting: Psychology

## 2019-05-18 ENCOUNTER — Other Ambulatory Visit: Payer: Self-pay

## 2019-05-18 ENCOUNTER — Ambulatory Visit (INDEPENDENT_AMBULATORY_CARE_PROVIDER_SITE_OTHER): Payer: BC Managed Care – PPO | Admitting: General Practice

## 2019-05-18 DIAGNOSIS — R760 Raised antibody titer: Secondary | ICD-10-CM

## 2019-05-18 DIAGNOSIS — Z7901 Long term (current) use of anticoagulants: Secondary | ICD-10-CM

## 2019-05-18 LAB — POCT INR: INR: 1.6 — AB (ref 2.0–3.0)

## 2019-05-18 NOTE — Patient Instructions (Incomplete)
Pre visit review using our clinic review tool, if applicable. No additional management support is needed unless otherwise documented below in the visit note.  Continue to take 2 tablets daily except take 1 tablet on Sun and Thurs. Re-check in 4 weeks.

## 2019-05-26 ENCOUNTER — Ambulatory Visit (INDEPENDENT_AMBULATORY_CARE_PROVIDER_SITE_OTHER): Payer: BC Managed Care – PPO | Admitting: Psychology

## 2019-05-26 DIAGNOSIS — F411 Generalized anxiety disorder: Secondary | ICD-10-CM | POA: Diagnosis not present

## 2019-05-30 NOTE — Progress Notes (Signed)
Virtual Visit via Video Note  I connected with [name] on 05/30/19 at 11:00 AM EST by a video enabled telemedicine application and verified that I am speaking with the correct person using two identifiers.  Location: Patient: Home  Provider: Clinic This service was conducted via virtual visit.  Both audio and visual tools were used.  The patient was located at home. I was located in my office.  Consent was obtained prior to the virtual visit and is aware of possible charges through their insurance for this visit.  The patient is an established patient.  Dr. Estanislado Pandy, MD conducted the virtual visit and Hazel Sams, PA-C acted as scribe during the service.  Office staff helped with scheduling follow up visits after the service was conducted.   I discussed the limitations of evaluation and management by telemedicine and the availability of in person appointments. The patient expressed understanding and agreed to proceed.   CC: Medication monitoring  History of Present Illness:  [name] is a 59 y.o. female with history of seronegative rheumatoid arthritis, osteoarthritis, and osteoporosis.  She is taking Methotrexate 8 tablets po once weekly, folic acid 2 mg po daily, and Plaquenil 200 mg 1 tablet by mouth daily.  She denies any recent rheumatoid arthritis flares. She denies any joint pain or inflammation at this time. She has occasional arthralgias.  She is planning on receiving the covid-19 vaccine once it is available.    Review of Systems  Constitutional: Positive for malaise/fatigue. Negative for fever.  HENT: Negative for congestion.   Eyes: Negative for photophobia, pain, discharge and redness.       +Eye dryness   Respiratory: Negative for cough, shortness of breath and wheezing.   Cardiovascular: Positive for leg swelling. Negative for chest pain and palpitations.  Gastrointestinal: Negative for blood in stool, constipation and diarrhea.  Genitourinary: Negative  for dysuria and urgency.  Musculoskeletal: Positive for joint pain. Negative for back pain, myalgias and neck pain.  Skin: Negative for rash.  Neurological: Negative for dizziness and headaches.  Psychiatric/Behavioral: Negative for depression. The patient is not nervous/anxious and does not have insomnia.    Observations/Objective:  Physical Exam  Constitutional: She is oriented to person, place, and time and well-developed, well-nourished, and in no distress.  HENT:  Head: Normocephalic and atraumatic.  Eyes: Conjunctivae are normal.  Pulmonary/Chest: Effort normal.  Neurological: She is alert and oriented to person, place, and time.  Psychiatric: Mood, memory, affect and judgment normal.   Patient reports morning stiffness for 0   NONE.   Patient denies nocturnal pain.  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  She has no joint pain or inflammation at this time. She has occasional arthralgias but the discomfort is fleeting and she has no inflammation. She has no difficulty with ADLs. She has not had any morning stiffness or nocturnal pain. She is clinically doing well on Plaquenil 200 mg 1 tablet by mouth daily, MTX 8 tablets po once weekly, and folic acid 2 mg po daily.  She will continue on this current treatment regimen.  She was advised to notify us if she develops increased joint pain or joint swelling.  She will follow up in 5  months.   High risk medication use - Plaquenil 200 mg by mouth daily, Methotrexate 8 tablets by mouth every week, and folic acid 2 mg by mouth daily.  Eye Exam: 07/01/2018.  CBC and CMP were  drawn on 04/13/19.  She is due to update lab work on February and every 3 months. Standing order were placed today. She was encouraged to receive the COVID-19 vaccine.   Primary osteoarthritis of both knees: She is not having any knee joint pain or inflammation at this time.  She has no difficulty climbing steps or getting up from a chair.    Primary osteoarthritis of both feet: She has no feet pain or inflammation at this time.  Sicca syndrome ([place]): Sicca symptoms have been manageable. She uses restasis eyedrops.   Age related osteoporosis, unspecified pathological fracture presence -She discontinued fosamax in May 2018 due to worsening reflux. DEXA July 2018 left femur BMD 0.736 with the change of -0.3% T score of -2.5. 07/09/17 DEXA Right forearm BMD 0.711 with T-score -1.9. Future order for DEXA is in place.  She is taking a calcium and vitamin D supplement.   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  History of hyperlipidemia   History of pulmonary embolism - She is on Warfarin    History of migraine   History of anemia   Follow Up Instructions: She will follow up in 5 months.    I discussed the assessment and treatment plan with the patient. The patient was provided an opportunity to ask questions and all were answered. The patient agreed with the plan and demonstrated an understanding of the instructions.   The patient was advised to call back or seek an in-person evaluation if the symptoms worsen or if the condition fails to improve as anticipated.  I provided 30 minutes of non-face-to-face time during this encounter.  Bo Merino, MD   Scribed by-  Hazel Sams, PA-C

## 2019-06-01 ENCOUNTER — Other Ambulatory Visit: Payer: Self-pay

## 2019-06-01 ENCOUNTER — Telehealth (INDEPENDENT_AMBULATORY_CARE_PROVIDER_SITE_OTHER): Payer: BC Managed Care – PPO | Admitting: Rheumatology

## 2019-06-01 ENCOUNTER — Encounter: Payer: Self-pay | Admitting: Rheumatology

## 2019-06-01 DIAGNOSIS — M0609 Rheumatoid arthritis without rheumatoid factor, multiple sites: Secondary | ICD-10-CM | POA: Diagnosis not present

## 2019-06-01 DIAGNOSIS — M810 Age-related osteoporosis without current pathological fracture: Secondary | ICD-10-CM

## 2019-06-01 DIAGNOSIS — Z79899 Other long term (current) drug therapy: Secondary | ICD-10-CM

## 2019-06-01 DIAGNOSIS — Z862 Personal history of diseases of the blood and blood-forming organs and certain disorders involving the immune mechanism: Secondary | ICD-10-CM

## 2019-06-01 DIAGNOSIS — M19071 Primary osteoarthritis, right ankle and foot: Secondary | ICD-10-CM

## 2019-06-01 DIAGNOSIS — M3500 Sicca syndrome, unspecified: Secondary | ICD-10-CM

## 2019-06-01 DIAGNOSIS — Z8639 Personal history of other endocrine, nutritional and metabolic disease: Secondary | ICD-10-CM

## 2019-06-01 DIAGNOSIS — H40053 Ocular hypertension, bilateral: Secondary | ICD-10-CM

## 2019-06-01 DIAGNOSIS — M19072 Primary osteoarthritis, left ankle and foot: Secondary | ICD-10-CM

## 2019-06-01 DIAGNOSIS — Z8669 Personal history of other diseases of the nervous system and sense organs: Secondary | ICD-10-CM

## 2019-06-01 DIAGNOSIS — M170 Bilateral primary osteoarthritis of knee: Secondary | ICD-10-CM | POA: Diagnosis not present

## 2019-06-01 DIAGNOSIS — Z86711 Personal history of pulmonary embolism: Secondary | ICD-10-CM

## 2019-06-09 ENCOUNTER — Ambulatory Visit (INDEPENDENT_AMBULATORY_CARE_PROVIDER_SITE_OTHER): Payer: BC Managed Care – PPO | Admitting: Psychology

## 2019-06-09 DIAGNOSIS — F411 Generalized anxiety disorder: Secondary | ICD-10-CM

## 2019-06-10 ENCOUNTER — Other Ambulatory Visit: Payer: Self-pay | Admitting: *Deleted

## 2019-06-10 DIAGNOSIS — M0609 Rheumatoid arthritis without rheumatoid factor, multiple sites: Secondary | ICD-10-CM

## 2019-06-10 MED ORDER — HYDROXYCHLOROQUINE SULFATE 200 MG PO TABS: 200.0000 mg | ORAL_TABLET | Freq: Every day | ORAL | 0 refills | Status: DC

## 2019-06-10 NOTE — Telephone Encounter (Signed)
Last Visit: 06/01/19 Next Visit:  Due June 2021 Labs: 04/08/19 CMP WNL. RBC count is elevated but stable. Rest of CBC WNL. Eye exam: 07/01/18 WNL  Okay to refill per Dr. Estanislado Pandy

## 2019-06-14 ENCOUNTER — Other Ambulatory Visit: Payer: Self-pay

## 2019-06-15 ENCOUNTER — Ambulatory Visit (INDEPENDENT_AMBULATORY_CARE_PROVIDER_SITE_OTHER): Payer: BC Managed Care – PPO | Admitting: General Practice

## 2019-06-15 DIAGNOSIS — Z7901 Long term (current) use of anticoagulants: Secondary | ICD-10-CM | POA: Diagnosis not present

## 2019-06-15 DIAGNOSIS — R760 Raised antibody titer: Secondary | ICD-10-CM

## 2019-06-15 LAB — POCT INR: INR: 1.4 — AB (ref 2.0–3.0)

## 2019-06-15 NOTE — Patient Instructions (Addendum)
Pre visit review using our clinic review tool, if applicable. No additional management support is needed unless otherwise documented below in the visit note.  Increase dose today to 7.5mg  then continue to take 2 tablets daily except take 1 tablet on Sun and Thurs. Re-check in 4 weeks.

## 2019-06-22 ENCOUNTER — Other Ambulatory Visit: Payer: Self-pay | Admitting: Obstetrics and Gynecology

## 2019-06-22 DIAGNOSIS — Z1231 Encounter for screening mammogram for malignant neoplasm of breast: Secondary | ICD-10-CM

## 2019-06-23 ENCOUNTER — Ambulatory Visit: Payer: BC Managed Care – PPO | Admitting: Psychology

## 2019-07-07 ENCOUNTER — Ambulatory Visit (INDEPENDENT_AMBULATORY_CARE_PROVIDER_SITE_OTHER): Payer: BC Managed Care – PPO | Admitting: Psychology

## 2019-07-07 DIAGNOSIS — F411 Generalized anxiety disorder: Secondary | ICD-10-CM

## 2019-07-12 ENCOUNTER — Other Ambulatory Visit: Payer: Self-pay

## 2019-07-13 ENCOUNTER — Ambulatory Visit (INDEPENDENT_AMBULATORY_CARE_PROVIDER_SITE_OTHER): Payer: BC Managed Care – PPO | Admitting: General Practice

## 2019-07-13 DIAGNOSIS — Z7901 Long term (current) use of anticoagulants: Secondary | ICD-10-CM | POA: Diagnosis not present

## 2019-07-13 DIAGNOSIS — R760 Raised antibody titer: Secondary | ICD-10-CM

## 2019-07-13 LAB — POCT INR: INR: 1.5 — AB (ref 2.0–3.0)

## 2019-07-13 NOTE — Patient Instructions (Signed)
Pre visit review using our clinic review tool, if applicable. No additional management support is needed unless otherwise documented below in the visit note.  Continue to take 2 tablets daily except take 1 tablet on Sun and Thurs. Re-check in 4 to 6 weeks.

## 2019-07-21 ENCOUNTER — Ambulatory Visit (INDEPENDENT_AMBULATORY_CARE_PROVIDER_SITE_OTHER): Payer: BC Managed Care – PPO | Admitting: Psychology

## 2019-07-21 DIAGNOSIS — F411 Generalized anxiety disorder: Secondary | ICD-10-CM

## 2019-07-28 ENCOUNTER — Encounter: Payer: Self-pay | Admitting: Rheumatology

## 2019-08-02 ENCOUNTER — Other Ambulatory Visit: Payer: Self-pay | Admitting: *Deleted

## 2019-08-02 MED ORDER — METHOTREXATE 2.5 MG PO TABS: 20.0000 mg | ORAL_TABLET | ORAL | 0 refills | Status: DC

## 2019-08-02 NOTE — Telephone Encounter (Signed)
Ok to refill 30 day supply of MTX.  Please advise patient to update lab work ASAP.

## 2019-08-02 NOTE — Telephone Encounter (Signed)
Refill request received via fax  Last Visit: 06/01/19 Next Visit: 11/02/19 Labs: 04/08/19 CMP WNL. RBC count is elevated but stable. Rest of CBC WNL.  Attempted to contact the patient and left message to advise patient she is due to update labs.   Dose per last office note on 06/01/19: Methotrexate 8 tablets po once weekly.  Okay to refill 30 day supply MTX?

## 2019-08-03 ENCOUNTER — Ambulatory Visit: Payer: BC Managed Care – PPO

## 2019-08-04 ENCOUNTER — Ambulatory Visit (INDEPENDENT_AMBULATORY_CARE_PROVIDER_SITE_OTHER): Payer: BC Managed Care – PPO | Admitting: Psychology

## 2019-08-04 DIAGNOSIS — F411 Generalized anxiety disorder: Secondary | ICD-10-CM | POA: Diagnosis not present

## 2019-08-10 ENCOUNTER — Telehealth (INDEPENDENT_AMBULATORY_CARE_PROVIDER_SITE_OTHER): Payer: BC Managed Care – PPO | Admitting: Family Medicine

## 2019-08-10 DIAGNOSIS — Z7189 Other specified counseling: Secondary | ICD-10-CM | POA: Diagnosis not present

## 2019-08-10 DIAGNOSIS — Z7901 Long term (current) use of anticoagulants: Secondary | ICD-10-CM | POA: Diagnosis not present

## 2019-08-10 DIAGNOSIS — R350 Frequency of micturition: Secondary | ICD-10-CM

## 2019-08-10 DIAGNOSIS — N3001 Acute cystitis with hematuria: Secondary | ICD-10-CM | POA: Diagnosis not present

## 2019-08-10 LAB — POC URINALSYSI DIPSTICK (AUTOMATED)
Bilirubin, UA: NEGATIVE
Glucose, UA: NEGATIVE
Ketones, UA: NEGATIVE
Nitrite, UA: NEGATIVE
Protein, UA: NEGATIVE
Spec Grav, UA: 1.01 (ref 1.010–1.025)
Urobilinogen, UA: 0.2 E.U./dL
pH, UA: 7.5 (ref 5.0–8.0)

## 2019-08-10 MED ORDER — AMOXICILLIN-POT CLAVULANATE 500-125 MG PO TABS: 1.0000 | ORAL_TABLET | Freq: Two times a day (BID) | ORAL | 0 refills | Status: AC

## 2019-08-10 NOTE — Progress Notes (Signed)
Virtual Visit via Video Note  I connected with Franchelle Frappier on 08/10/19 at  2:00 PM EDT by a video enabled telemedicine application 2/2 [id number] pandemic and verified that I am speaking with the correct person using two identifiers.  Location patient: home Location provider:work or home office Persons participating in the virtual visit: patient, provider  I discussed the limitations of evaluation and management by telemedicine and the availability of in person appointments. The patient expressed understanding and agreed to proceed.   HPI: Pt is a 59 yo female with pmh sig for RA, Sicca syndrome with lung involvement, h/o PE on coumadin, OA, vaginal atrophy, HLD, h/o anxiety followed by Dr. Regis Bill.  Pt seen for acute concern.  Notes urinary frequency (q 1 hr) and pain after emptying bladder that started Monday.  Denies dysuria, n/v, fever (97.22F), chills, back pain, suprapubic pain. Increasing po intake of water.  Endorses recent intercourse.  Notes vaginal dryness causing painful intercourse, seen by OB/Gyn.  Unable to take estrogen 2/2 h/o blood clots.  On Coumadin.  Using an aloe lubricant.   Pt recently had her fist dose of COVID-19 vaccine.  Had to hold MTX.  2nd dose scheduled schedule for 20 days after 1st.   Inquires about timing of second dose.    ROS: See pertinent positives and negatives per HPI.  Past Medical History:  Diagnosis Date  . Adjustment disorder with anxiety   . B12 deficiency due to diet    is a vegetarian  . Chest pain, unspecified   . Chronic anticoagulation   . Headache(784.0)    migraines and head pain  . Hx of varicella    As Child  . Iron deficiency anemia, unspecified   . Otalgia, unspecified   . Other and unspecified hyperlipidemia   . Palpitations   . Personal history of venous thrombosis and embolism   . Recurrent UTI   . Rheumatoid arthritis(714.0)    deveshewar  . Unspecified vitamin D deficiency   . UTI (lower urinary tract infection)  11/28/2011   citrobacter       Past Surgical History:  Procedure Laterality Date  . ORIF ULNAR FRACTURE    . TYMPANOSTOMY TUBE PLACEMENT  2009   Right    Family History  Problem Relation Age of Onset  . Dementia Father   . Stroke Father   . Hyperlipidemia Father   . Diabetes Father   . Parkinsonism Mother   . Lymphoma Other   . Colon cancer Neg Hx      Current Outpatient Medications:  .  ALPRAZolam (XANAX) 0.25 MG tablet, Take 1 tablet (0.25 mg total) by mouth 2 (two) times daily as needed for anxiety., Disp: 60 tablet, Rfl: 0 .  Calcium Carb-Cholecalciferol (CALTRATE 600+D) 600-800 MG-UNIT TABS, Take by mouth., Disp: , Rfl:  .  Cholecalciferol (VITAMIN D3) 2000 UNITS TABS, Take 1 tablet by mouth daily., Disp: , Rfl:  .  cycloSPORINE (RESTASIS) 0.05 % ophthalmic emulsion, Place 1 drop into both eyes daily.  , Disp: , Rfl:  .  Eflornithine HCl (VANIQA) 13.9 % cream, Apply topically 2 (two) times daily with a meal., Disp: , Rfl:  .  folic acid (FOLVITE) 1 MG tablet, Take 2 tablets (2 mg total) by mouth daily., Disp: 180 tablet, Rfl: 3 .  hydroxychloroquine (PLAQUENIL) 200 MG tablet, Take 1 tablet (200 mg total) by mouth daily., Disp: 90 tablet, Rfl: 0 .  methotrexate (RHEUMATREX) 2.5 MG tablet, Take 8 tablets (20 mg total) by mouth  once a week. Caution:Chemotherapy. Protect from light., Disp: 32 tablet, Rfl: 0 .  mineral/vitamin supplement (MULTIGEN) 70 MG TABS tablet, Take 1 tablet (70 mg total) by mouth daily., Disp: 90 tablet, Rfl: 3 .  SUMAtriptan (IMITREX) 50 MG tablet, TAKE ONE TABLET BY MOUTH AS NEEDED. MAY REPEAT IN 2 TO 4 HOURS IF NEEDED., Disp: 9 tablet, Rfl: 5 .  warfarin (COUMADIN) 2.5 MG tablet, Take 2 tablets daily except 1 tablet on Mon and Thurs or TAKE AS DIRECTED BY ANTICOAGULATION CLINIC, Disp: 180 tablet, Rfl: 1  EXAM:  VITALS per patient if applicable:  RR between 12-20 bpm,  Temp 97.17F  GENERAL: alert, oriented, appears well and in no acute  distress  HEENT: atraumatic, conjunctiva clear, no obvious abnormalities on inspection of external nose and ears  NECK: normal movements of the head and neck  LUNGS: on inspection no signs of respiratory distress, breathing rate appears normal, no obvious gross SOB, gasping or wheezing  CV: no obvious cyanosis  MS: moves all visible extremities without noticeable abnormality  PSYCH/NEURO: pleasant and cooperative, no obvious depression or anxiety, speech and thought processing grossly intact  ASSESSMENT AND PLAN:  Discussed the following assessment and plan:  Urinary frequency  - Plan: POCT Urinalysis Dipstick (Automated), Urine Culture  Acute cystitis with hematuria  -leuks and 1+ RBCs noted on UA -will obtain UCx -rx for augmentin sent to pharmacy.  Will adjust abx if needed based on cx results. -increase po intake of water and fluids -advised may need INR check as abx could affect levels. -given precautions.  Pt to contact clinic for continued or worsening symptoms - Plan: amoxicillin-clavulanate (AUGMENTIN) 500-125 MG tablet  Educated about COVID-19 virus infection -Question regarding timing of second Covid vaccine answered. -pt to hold MTX after 2nd dose as done with 1 st dose.  F/u prn   I discussed the assessment and treatment plan with the patient. The patient was provided an opportunity to ask questions and all were answered. The patient agreed with the plan and demonstrated an understanding of the instructions.   The patient was advised to call back or seek an in-person evaluation if the symptoms worsen or if the condition fails to improve as anticipated.  Billie Ruddy, MD

## 2019-08-10 NOTE — Addendum Note (Signed)
Addended by: Elmer Picker on: 08/10/2019 02:55 PM   Modules accepted: Orders

## 2019-08-12 ENCOUNTER — Telehealth: Payer: Self-pay | Admitting: Family Medicine

## 2019-08-12 LAB — URINE CULTURE
MICRO NUMBER:: 10290286
SPECIMEN QUALITY:: ADEQUATE

## 2019-08-12 NOTE — Telephone Encounter (Signed)
Pt stated that she would like to have her lab results and she is still taking the antibotics and would like to know if she needs to change the medication.  Pt would like to have a call today [phone].

## 2019-08-12 NOTE — Telephone Encounter (Signed)
Spoke with pt advised that she will get a phone call from the office regarding her Urine Culture results

## 2019-08-16 ENCOUNTER — Telehealth: Payer: Self-pay

## 2019-08-16 NOTE — Telephone Encounter (Signed)
Pt was seen on 08/10/2019 for UTI states that she is still having frequency with urination, states that she takes her last dose over the Antibiotic for UTI tomorrow, pt requests for another dose for a different Antibiotic Cipro since she has taken this before and it worked best for pt, please advise

## 2019-08-16 NOTE — Telephone Encounter (Signed)
Called to check on how pt is doing left a voice message for pt to call the office with any concern

## 2019-08-16 NOTE — Telephone Encounter (Signed)
Per UCx, bacteria susceptible to Augmentin.  If pt still having frequency would recommend f/u with pcp and repeat UA.

## 2019-08-16 NOTE — Telephone Encounter (Signed)
Spoke with pt advised pt of Dr Volanda Napoleon recommendations, pt verbalized understanding states that she will finish the Antibiotics and if she is still having issues she will call the office to schedule a f/u with PCP

## 2019-08-17 ENCOUNTER — Ambulatory Visit (INDEPENDENT_AMBULATORY_CARE_PROVIDER_SITE_OTHER): Payer: BC Managed Care – PPO | Admitting: General Practice

## 2019-08-17 ENCOUNTER — Other Ambulatory Visit: Payer: Self-pay

## 2019-08-17 ENCOUNTER — Telehealth: Payer: Self-pay | Admitting: Internal Medicine

## 2019-08-17 DIAGNOSIS — Z7901 Long term (current) use of anticoagulants: Secondary | ICD-10-CM | POA: Diagnosis not present

## 2019-08-17 DIAGNOSIS — R760 Raised antibody titer: Secondary | ICD-10-CM

## 2019-08-17 LAB — POCT INR: INR: 1.8 — AB (ref 2.0–3.0)

## 2019-08-17 NOTE — Telephone Encounter (Signed)
Error

## 2019-08-17 NOTE — Patient Instructions (Signed)
Pre visit review using our clinic review tool, if applicable. No additional management support is needed unless otherwise documented below in the visit note.  Continue to take 2 tablets daily except take 1 tablet on Sun and Thurs. Re-check in 4 to 6 weeks.

## 2019-08-18 ENCOUNTER — Encounter: Payer: Self-pay | Admitting: Internal Medicine

## 2019-08-18 ENCOUNTER — Telehealth (INDEPENDENT_AMBULATORY_CARE_PROVIDER_SITE_OTHER): Payer: BC Managed Care – PPO | Admitting: Internal Medicine

## 2019-08-18 ENCOUNTER — Ambulatory Visit (INDEPENDENT_AMBULATORY_CARE_PROVIDER_SITE_OTHER): Payer: BC Managed Care – PPO | Admitting: Psychology

## 2019-08-18 ENCOUNTER — Ambulatory Visit: Payer: BC Managed Care – PPO | Admitting: Psychology

## 2019-08-18 VITALS — Temp 97.7°F | Ht 63.5 in | Wt 162.0 lb

## 2019-08-18 DIAGNOSIS — Z7901 Long term (current) use of anticoagulants: Secondary | ICD-10-CM

## 2019-08-18 DIAGNOSIS — Z79899 Other long term (current) drug therapy: Secondary | ICD-10-CM | POA: Diagnosis not present

## 2019-08-18 DIAGNOSIS — Z0000 Encounter for general adult medical examination without abnormal findings: Secondary | ICD-10-CM | POA: Diagnosis not present

## 2019-08-18 DIAGNOSIS — Z87440 Personal history of urinary (tract) infections: Secondary | ICD-10-CM

## 2019-08-18 DIAGNOSIS — D509 Iron deficiency anemia, unspecified: Secondary | ICD-10-CM

## 2019-08-18 DIAGNOSIS — M810 Age-related osteoporosis without current pathological fracture: Secondary | ICD-10-CM

## 2019-08-18 DIAGNOSIS — M0609 Rheumatoid arthritis without rheumatoid factor, multiple sites: Secondary | ICD-10-CM

## 2019-08-18 DIAGNOSIS — R3915 Urgency of urination: Secondary | ICD-10-CM

## 2019-08-18 DIAGNOSIS — F411 Generalized anxiety disorder: Secondary | ICD-10-CM

## 2019-08-18 DIAGNOSIS — E538 Deficiency of other specified B group vitamins: Secondary | ICD-10-CM

## 2019-08-18 DIAGNOSIS — E559 Vitamin D deficiency, unspecified: Secondary | ICD-10-CM

## 2019-08-18 NOTE — Progress Notes (Signed)
Virtual Visit via Video Note  I connected with@ on 08/21/19 at  8:30 AM EDT by a video enabled telemedicine application and verified that I am speaking with the correct person using two identifiers. Location patient: home Location provider:home office Persons participating in the virtual visit: patient, provider  [place] national recommendations  regarding COVID 19 pandemic   video visit is advised over in office visit for this patient.  Patient aware  of the limitations of evaluation and management by telemedicine and  availability of in person appointments. and agreed to proceed.  HPI: [name] presents for video visit Onset last week of typical uti sx and hematuria  Placed on augmentin  Told to finish med although sx still persist    Now just finished and still frequency and urgency .   no fever has cipro rx filled never takenfrom last t giving  "cipro always works"   Should she take another med?  Du for [employer] vaccine  In a week and concerne  Doesn't want to be sick at that time has ?s  ROS: See pertinent positives and negatives per HPI.  Past Medical History:  Diagnosis Date  . Adjustment disorder with anxiety   . B12 deficiency due to diet    is a vegetarian  . Chest pain, unspecified   . Chronic anticoagulation   . Headache(784.0)    migraines and head pain  . Hx of varicella    As Child  . Iron deficiency anemia, unspecified   . Otalgia, unspecified   . Other and unspecified hyperlipidemia   . Palpitations   . Personal history of venous thrombosis and embolism   . Recurrent UTI   . Rheumatoid arthritis(714.0)    deveshewar  . Unspecified vitamin D deficiency   . UTI (lower urinary tract infection) 11/28/2011   citrobacter       Past Surgical History:  Procedure Laterality Date  . ORIF ULNAR FRACTURE    . TYMPANOSTOMY TUBE PLACEMENT  2009   Right    Family History  Problem Relation Age of Onset  . Dementia Father   . Stroke Father   .  Hyperlipidemia Father   . Diabetes Father   . Parkinsonism Mother   . Lymphoma Other   . Colon cancer Neg Hx     Social History   Tobacco Use  . Smoking status: Never Smoker  . Smokeless tobacco: Never Used  Substance Use Topics  . Alcohol use: No  . Drug use: Never      Current Outpatient Medications:  .  Calcium Carb-Cholecalciferol (CALTRATE 600+D) 600-800 MG-UNIT TABS, Take by mouth., Disp: , Rfl:  .  Cholecalciferol (VITAMIN D3) 2000 UNITS TABS, Take 1 tablet by mouth daily., Disp: , Rfl:  .  cycloSPORINE (RESTASIS) 0.05 % ophthalmic emulsion, Place 1 drop into both eyes daily.  , Disp: , Rfl:  .  Eflornithine HCl (VANIQA) 13.9 % cream, Apply topically 2 (two) times daily with a meal., Disp: , Rfl:  .  folic acid (FOLVITE) 1 MG tablet, Take 2 tablets (2 mg total) by mouth daily., Disp: 180 tablet, Rfl: 3 .  hydroxychloroquine (PLAQUENIL) 200 MG tablet, Take 1 tablet (200 mg total) by mouth daily., Disp: 90 tablet, Rfl: 0 .  mineral/vitamin supplement (MULTIGEN) 70 MG TABS tablet, Take 1 tablet (70 mg total) by mouth daily., Disp: 90 tablet, Rfl: 3 .  warfarin (COUMADIN) 2.5 MG tablet, Take 2 tablets daily except 1 tablet on Mon and Thurs or TAKE  AS DIRECTED BY ANTICOAGULATION CLINIC, Disp: 180 tablet, Rfl: 1 .  ALPRAZolam (XANAX) 0.25 MG tablet, Take 1 tablet (0.25 mg total) by mouth 2 (two) times daily as needed for anxiety. (Patient not taking: Reported on 08/18/2019), Disp: 60 tablet, Rfl: 0 .  methotrexate (RHEUMATREX) 2.5 MG tablet, Take 8 tablets (20 mg total) by mouth once a week. Caution:Chemotherapy. Protect from light. (Patient not taking: Reported on 08/18/2019), Disp: 32 tablet, Rfl: 0 .  SUMAtriptan (IMITREX) 50 MG tablet, TAKE ONE TABLET BY MOUTH AS NEEDED. MAY REPEAT IN 2 TO 4 HOURS IF NEEDED. (Patient not taking: Reported on 08/18/2019), Disp: 9 tablet, Rfl: 5  EXAM: BP Readings from Last 3 Encounters:  12/22/18 96/68  12/13/18 112/68  07/20/18 118/62    VITALS  per patient if applicable:  GENERAL: alert, oriented, appears well and in no acute distress  HEENT: atraumatic, conjunttiva clear, no obvious abnormalities on inspection of external nose and ears  NECK: normal movements of the head and neck  LUNGS: on inspection no signs of respiratory distress, breathing rate appears normal, no obvious gross SOB, gasping or wheezing  CV: no obvious cyanosis  PSYCH/NEURO: pleasant and cooperative, no obvious depression or anxiety, speech and thought processing grossly intact Lab Results  Component Value Date   WBC 8.5 04/08/2019   HGB 14.5 04/08/2019   HCT 44.3 04/08/2019   PLT 284 04/08/2019   GLUCOSE 96 04/08/2019   CHOL 184 07/20/2018   TRIG 142.0 07/20/2018   HDL 73.30 07/20/2018   LDLDIRECT 97.3 05/25/2013   LDLCALC 82 07/20/2018   ALT 12 04/08/2019   AST 16 04/08/2019   NA 141 04/08/2019   K 4.5 04/08/2019   CL 106 04/08/2019   CREATININE 0.82 04/08/2019   BUN 14 04/08/2019   CO2 23 04/08/2019   TSH 3.43 07/20/2018   INR 1.8 (A) 08/17/2019   HGBA1C 6.0 07/20/2018    ASSESSMENT AND PLAN:  Discussed the following assessment and plan:    ICD-10-CM   1. Urgency of urination  R39.15 POCT Urinalysis Dipstick (Automated)    Urine Culture    Basic metabolic panel    Hepatic function panel    CBC with Differential/Platelet    Lipid panel    TSH    VITAMIN D 25 Hydroxy (Vit-D Deficiency, Fractures)    Iron, TIBC and Ferritin Panel    Vitamin B12  2. History of recurrent UTI (urinary tract infection)  Z87.440 POCT Urinalysis Dipstick (Automated)    Urine Culture    Basic metabolic panel    Hepatic function panel    CBC with Differential/Platelet    Lipid panel    TSH    VITAMIN D 25 Hydroxy (Vit-D Deficiency, Fractures)    Iron, TIBC and Ferritin Panel    Vitamin B12  3. Medication management  [id number] Basic metabolic panel    Hepatic function panel    CBC with Differential/Platelet    Lipid panel    TSH    VITAMIN D 25  Hydroxy (Vit-D Deficiency, Fractures)    Iron, TIBC and Ferritin Panel    Vitamin B12  4. lab  for preventive health   [id number] Basic metabolic panel    Hepatic function panel    CBC with Differential/Platelet    Lipid panel    TSH    VITAMIN D 25 Hydroxy (Vit-D Deficiency, Fractures)    Iron, TIBC and Ferritin Panel    Vitamin B12  5. B12 deficiency  0000000 Basic metabolic panel  Hepatic function panel    CBC with Differential/Platelet    Lipid panel    TSH    VITAMIN D 25 Hydroxy (Vit-D Deficiency, Fractures)    Iron, TIBC and Ferritin Panel    Vitamin B12  6. Osteoporosis, unspecified osteoporosis type, unspecified pathological fracture presence  [id number] Basic metabolic panel    Hepatic function panel    CBC with Differential/Platelet    Lipid panel    TSH    VITAMIN D 25 Hydroxy (Vit-D Deficiency, Fractures)    Iron, TIBC and Ferritin Panel    Vitamin B12  7. Rheumatoid arthritis of multiple sites with negative rheumatoid factor (HCC)  0000000 Basic metabolic panel    Hepatic function panel    CBC with Differential/Platelet    Lipid panel    TSH    VITAMIN D 25 Hydroxy (Vit-D Deficiency, Fractures)    Iron, TIBC and Ferritin Panel    Vitamin B12  8. Iron deficiency anemia, unspecified iron deficiency anemia type  [id number] Basic metabolic panel    Hepatic function panel    CBC with Differential/Platelet    Lipid panel    TSH    VITAMIN D 25 Hydroxy (Vit-D Deficiency, Fractures)    Iron, TIBC and Ferritin Panel    Vitamin B12  9. Chronic anticoagulation  [id number] Basic metabolic panel    Hepatic function panel    CBC with Differential/Platelet    Lipid panel    TSH    VITAMIN D 25 Hydroxy (Vit-D Deficiency, Fractures)    Iron, TIBC and Ferritin Panel    Vitamin B12  10. Vitamin D deficiency  [id number] Basic metabolic panel    Hepatic function panel    CBC with Differential/Platelet    Lipid panel    TSH    VITAMIN D 25 Hydroxy (Vit-D Deficiency, Fractures)    Iron, TIBC  and Ferritin Panel    Vitamin B12  patient with immunocomp risk and hx fo recurrent uti  Apparently not respinding to the augmentin  She has not had se of ciproin past and 3 days holiday weekend  Now so plan will be  Ok to take cipro she has at home from November  For 4-5 days and if  Not better next week get repeat UA and ucx   Reassured ok to get vaccine if no fever new sx .  Due a week from now .  Also she will make appt for her cpx and labs pre visit to include  b12 iron studies vit d   Counseled.   Expectant management and discussion of plan and treatment with opportunity to ask questions and all were answered. The patient agreed with the plan and demonstrated an understanding of the instructions.  record review  Klebsiella  Advised to call back or seek an in-person evaluation if worsening  or having  further concerns . Return for up date after weekend about uti sx  plan routine onitoring labs also .  Outside external source  DATA REVIEWED:   Independent historian:  Total time on date  of service including record review ordering and plan of care:  30 minutes       Shanon Ace, MD

## 2019-08-22 ENCOUNTER — Other Ambulatory Visit (INDEPENDENT_AMBULATORY_CARE_PROVIDER_SITE_OTHER): Payer: BC Managed Care – PPO

## 2019-08-22 ENCOUNTER — Other Ambulatory Visit: Payer: Self-pay

## 2019-08-22 DIAGNOSIS — Z87440 Personal history of urinary (tract) infections: Secondary | ICD-10-CM | POA: Diagnosis not present

## 2019-08-22 DIAGNOSIS — R3915 Urgency of urination: Secondary | ICD-10-CM | POA: Diagnosis not present

## 2019-08-22 LAB — POC URINALSYSI DIPSTICK (AUTOMATED)
Glucose, UA: NEGATIVE
Leukocytes, UA: NEGATIVE
Protein, UA: NEGATIVE
Spec Grav, UA: 1.025 (ref 1.010–1.025)
Urobilinogen, UA: 0.2 E.U./dL
pH, UA: 6 (ref 5.0–8.0)

## 2019-08-22 NOTE — Telephone Encounter (Signed)
Urine looks good today so far    Ok to take 2 more days if you think is helping       I don't have a specific urologist to refer to  I know you have seen dr Gaynelle Arabian in past and he I believe has retired  I am aware of dr [place] Pink   [place] [facility] having a good review but she is a TEFL teacher t  Otherwise call [facility] since you have been seen there  For FU of you recurrent sx

## 2019-08-23 ENCOUNTER — Ambulatory Visit: Payer: BC Managed Care – PPO

## 2019-08-23 ENCOUNTER — Encounter: Payer: Self-pay | Admitting: Rheumatology

## 2019-08-23 LAB — URINE CULTURE
MICRO NUMBER:: 10327496
Result:: NO GROWTH
SPECIMEN QUALITY:: ADEQUATE

## 2019-08-24 ENCOUNTER — Ambulatory Visit: Payer: BC Managed Care – PPO

## 2019-08-24 NOTE — Progress Notes (Signed)
Urine showed no bacterial   Ok to stop the  antibiotic

## 2019-08-24 NOTE — Telephone Encounter (Signed)
Pt states that she is confused whether or not she should take the last antibiotic she has? She stated the message sent to her this morning said ok to take it if pt prefers but when she got her labs results an hour later Panosh said ok to stop taking antibiotic. Pt would like clarity and a call back from CMA   Pt would like a call back at [phone]

## 2019-08-31 ENCOUNTER — Ambulatory Visit: Payer: BC Managed Care – PPO | Admitting: Psychology

## 2019-09-01 ENCOUNTER — Ambulatory Visit: Payer: BC Managed Care – PPO | Admitting: Psychology

## 2019-09-02 ENCOUNTER — Other Ambulatory Visit: Payer: Self-pay

## 2019-09-02 ENCOUNTER — Other Ambulatory Visit: Payer: Self-pay | Admitting: Internal Medicine

## 2019-09-02 DIAGNOSIS — Z7901 Long term (current) use of anticoagulants: Secondary | ICD-10-CM

## 2019-09-02 DIAGNOSIS — M0609 Rheumatoid arthritis without rheumatoid factor, multiple sites: Secondary | ICD-10-CM

## 2019-09-02 MED ORDER — HYDROXYCHLOROQUINE SULFATE 200 MG PO TABS: 200.0000 mg | ORAL_TABLET | Freq: Every day | ORAL | 0 refills | Status: DC

## 2019-09-02 NOTE — Telephone Encounter (Signed)
Ok to refill 30 day supply of PLQ.

## 2019-09-02 NOTE — Telephone Encounter (Signed)
Refill request received via fax from [facility] on [address] for plaquenil.   Last Visit: 06/01/2019 telemedicine  Next Visit: 11/02/2019 Labs: 04/08/2019 CMP WNL. RBC count is elevated but stable. Rest of CBC WNL. Eye exam: 07/01/2018  Advised patient she is due to update labs and plaquenil eye exam. Patient states she is scheduled to update both within the next 2 weeks and will have both reports sent to our office.  Okay to refill 30 day supply of plaquenil?

## 2019-09-02 NOTE — Telephone Encounter (Signed)
Pt compliant with coumadin management. Sent in script 

## 2019-09-12 ENCOUNTER — Encounter: Payer: Self-pay | Admitting: Rheumatology

## 2019-09-12 ENCOUNTER — Other Ambulatory Visit: Payer: Self-pay

## 2019-09-13 ENCOUNTER — Other Ambulatory Visit (INDEPENDENT_AMBULATORY_CARE_PROVIDER_SITE_OTHER): Payer: BC Managed Care – PPO

## 2019-09-13 DIAGNOSIS — Z0000 Encounter for general adult medical examination without abnormal findings: Secondary | ICD-10-CM

## 2019-09-13 DIAGNOSIS — E559 Vitamin D deficiency, unspecified: Secondary | ICD-10-CM | POA: Diagnosis not present

## 2019-09-13 DIAGNOSIS — E538 Deficiency of other specified B group vitamins: Secondary | ICD-10-CM | POA: Diagnosis not present

## 2019-09-13 DIAGNOSIS — D509 Iron deficiency anemia, unspecified: Secondary | ICD-10-CM

## 2019-09-13 DIAGNOSIS — M810 Age-related osteoporosis without current pathological fracture: Secondary | ICD-10-CM | POA: Diagnosis not present

## 2019-09-13 DIAGNOSIS — Z87440 Personal history of urinary (tract) infections: Secondary | ICD-10-CM | POA: Diagnosis not present

## 2019-09-13 DIAGNOSIS — Z79899 Other long term (current) drug therapy: Secondary | ICD-10-CM

## 2019-09-13 DIAGNOSIS — R3915 Urgency of urination: Secondary | ICD-10-CM

## 2019-09-13 DIAGNOSIS — M0609 Rheumatoid arthritis without rheumatoid factor, multiple sites: Secondary | ICD-10-CM

## 2019-09-13 DIAGNOSIS — Z7901 Long term (current) use of anticoagulants: Secondary | ICD-10-CM

## 2019-09-13 LAB — HEPATIC FUNCTION PANEL
ALT: 10 U/L (ref 0–35)
AST: 15 U/L (ref 0–37)
Albumin: 4.1 g/dL (ref 3.5–5.2)
Alkaline Phosphatase: 83 U/L (ref 39–117)
Bilirubin, Direct: 0.2 mg/dL (ref 0.0–0.3)
Total Bilirubin: 1 mg/dL (ref 0.2–1.2)
Total Protein: 7 g/dL (ref 6.0–8.3)

## 2019-09-13 LAB — CBC WITH DIFFERENTIAL/PLATELET
Basophils Absolute: 0 10*3/uL (ref 0.0–0.1)
Basophils Relative: 0.5 % (ref 0.0–3.0)
Eosinophils Absolute: 0.1 10*3/uL (ref 0.0–0.7)
Eosinophils Relative: 1.3 % (ref 0.0–5.0)
HCT: 43.3 % (ref 36.0–46.0)
Hemoglobin: 14 g/dL (ref 12.0–15.0)
Lymphocytes Relative: 33.9 % (ref 12.0–46.0)
Lymphs Abs: 2.6 10*3/uL (ref 0.7–4.0)
MCHC: 32.3 g/dL (ref 30.0–36.0)
MCV: 86.2 fl (ref 78.0–100.0)
Monocytes Absolute: 0.6 10*3/uL (ref 0.1–1.0)
Monocytes Relative: 7.8 % (ref 3.0–12.0)
Neutro Abs: 4.3 10*3/uL (ref 1.4–7.7)
Neutrophils Relative %: 56.5 % (ref 43.0–77.0)
Platelets: 232 10*3/uL (ref 150.0–400.0)
RBC: 5.02 Mil/uL (ref 3.87–5.11)
RDW: 13.2 % (ref 11.5–15.5)
WBC: 7.7 10*3/uL (ref 4.0–10.5)

## 2019-09-13 LAB — LIPID PANEL
Cholesterol: 194 mg/dL (ref 0–200)
HDL: 66.2 mg/dL (ref >39.00)
LDL Cholesterol: 100 mg/dL — ABNORMAL HIGH (ref 0–99)
NonHDL: 127.71
Total CHOL/HDL Ratio: 3
Triglycerides: 139 mg/dL (ref 0.0–149.0)
VLDL: 27.8 mg/dL (ref 0.0–40.0)

## 2019-09-13 LAB — BASIC METABOLIC PANEL
BUN: 10 mg/dL (ref 6–23)
CO2: 30 mEq/L (ref 19–32)
Calcium: 9 mg/dL (ref 8.4–10.5)
Chloride: 101 mEq/L (ref 96–112)
Creatinine, Ser: 0.76 mg/dL (ref 0.40–1.20)
GFR: 77.32 mL/min (ref >60.00)
Glucose, Bld: 90 mg/dL (ref 70–99)
Potassium: 4 mEq/L (ref 3.5–5.1)
Sodium: 137 mEq/L (ref 135–145)

## 2019-09-13 LAB — VITAMIN D 25 HYDROXY (VIT D DEFICIENCY, FRACTURES): VITD: 44.89 ng/mL (ref 30.00–100.00)

## 2019-09-13 LAB — TSH: TSH: 3.21 u[IU]/mL (ref 0.35–4.50)

## 2019-09-13 LAB — VITAMIN B12: Vitamin B-12: 625 pg/mL (ref 211–911)

## 2019-09-14 LAB — IRON,TIBC AND FERRITIN PANEL
%SAT: 33 % (calc) (ref 16–45)
Ferritin: 203 ng/mL (ref 16–288)
Iron: 96 ug/dL (ref 45–160)
TIBC: 290 mcg/dL (calc) (ref 250–450)

## 2019-09-15 ENCOUNTER — Ambulatory Visit (INDEPENDENT_AMBULATORY_CARE_PROVIDER_SITE_OTHER): Payer: BC Managed Care – PPO | Admitting: Psychology

## 2019-09-15 DIAGNOSIS — F411 Generalized anxiety disorder: Secondary | ICD-10-CM | POA: Diagnosis not present

## 2019-09-16 NOTE — Progress Notes (Signed)
Results in range   ldl borderline but not worrisome low risk  The 10-year ASCVD risk score Krystal Bussing DC Jr., et al., 2013) is: 1.8%   Values used to calculate the score:     Age: 59 years     Sex: Female     Is Non-Hispanic African American: No     Diabetic: No     Tobacco smoker: No     Systolic Blood Pressure: 96 mmHg     Is BP treated: No     HDL Cholesterol: 66.2 mg/dL     Total Cholesterol: 194 mg/dL

## 2019-09-21 ENCOUNTER — Ambulatory Visit: Payer: BC Managed Care – PPO

## 2019-09-23 ENCOUNTER — Other Ambulatory Visit: Payer: Self-pay

## 2019-09-26 ENCOUNTER — Other Ambulatory Visit: Payer: Self-pay

## 2019-09-26 ENCOUNTER — Other Ambulatory Visit: Payer: Self-pay | Admitting: Rheumatology

## 2019-09-26 ENCOUNTER — Ambulatory Visit (INDEPENDENT_AMBULATORY_CARE_PROVIDER_SITE_OTHER): Payer: BC Managed Care – PPO | Admitting: General Practice

## 2019-09-26 DIAGNOSIS — R760 Raised antibody titer: Secondary | ICD-10-CM

## 2019-09-26 DIAGNOSIS — Z7901 Long term (current) use of anticoagulants: Secondary | ICD-10-CM

## 2019-09-26 DIAGNOSIS — M0609 Rheumatoid arthritis without rheumatoid factor, multiple sites: Secondary | ICD-10-CM

## 2019-09-26 LAB — POCT INR: INR: 1.9 — AB (ref 2.0–3.0)

## 2019-09-26 NOTE — Patient Instructions (Addendum)
Pre visit review using our clinic review tool, if applicable. No additional management support is needed unless otherwise documented below in the visit note.  Continue to take 2 tablets daily except take 1 tablet on Sun and Thurs. Re-check in 6 weeks.

## 2019-09-27 ENCOUNTER — Encounter: Payer: Self-pay | Admitting: Rheumatology

## 2019-09-27 MED ORDER — HYDROXYCHLOROQUINE SULFATE 200 MG PO TABS: 200.0000 mg | ORAL_TABLET | Freq: Every day | ORAL | 0 refills | Status: DC

## 2019-09-27 MED ORDER — METHOTREXATE 2.5 MG PO TABS: 20.0000 mg | ORAL_TABLET | ORAL | 0 refills | Status: DC

## 2019-09-27 NOTE — Telephone Encounter (Signed)
Last Visit:06/01/2019 telemedicine Next Visit:11/02/2019 Labs:09/13/2019 WNL  Current Dose per office note on 06/01/2019:  MTX 8 tablets po once weekly  Okay to refill per Dr. Estanislado Pandy

## 2019-09-27 NOTE — Telephone Encounter (Addendum)
Last Visit: 06/01/2019 telemedicine  Next Visit: 11/02/2019 Labs: 09/13/2019 WNL Eye exam: 09/26/2019 WNL  Current Dose per office note on 06/01/2019: Plaquenil 200 mg 1 tablet by mouth daily  Okay to refill per Dr. Estanislado Pandy

## 2019-09-28 ENCOUNTER — Other Ambulatory Visit: Payer: Self-pay | Admitting: *Deleted

## 2019-09-28 ENCOUNTER — Ambulatory Visit: Payer: BC Managed Care – PPO

## 2019-09-28 MED ORDER — FOLIC ACID 1 MG PO TABS: 2.0000 mg | ORAL_TABLET | Freq: Every day | ORAL | 3 refills | Status: DC

## 2019-09-28 NOTE — Telephone Encounter (Signed)
Refill request received via fax  Last Visit:06/01/2019 telemedicine Next Visit:11/02/2019  Okay to refill per Dr. Estanislado Pandy

## 2019-09-29 ENCOUNTER — Ambulatory Visit (INDEPENDENT_AMBULATORY_CARE_PROVIDER_SITE_OTHER): Payer: BC Managed Care – PPO | Admitting: Psychology

## 2019-09-29 DIAGNOSIS — F411 Generalized anxiety disorder: Secondary | ICD-10-CM

## 2019-10-07 ENCOUNTER — Ambulatory Visit
Admission: RE | Admit: 2019-10-07 | Discharge: 2019-10-07 | Disposition: A | Discharge status: Home / Self Care | Payer: BC Managed Care – PPO | Source: Ambulatory Visit | Attending: Obstetrics and Gynecology | Admitting: Obstetrics and Gynecology

## 2019-10-07 ENCOUNTER — Other Ambulatory Visit: Payer: Self-pay

## 2019-10-07 DIAGNOSIS — Z1231 Encounter for screening mammogram for malignant neoplasm of breast: Secondary | ICD-10-CM

## 2019-10-12 ENCOUNTER — Ambulatory Visit (INDEPENDENT_AMBULATORY_CARE_PROVIDER_SITE_OTHER): Payer: BC Managed Care – PPO | Admitting: General Practice

## 2019-10-12 ENCOUNTER — Other Ambulatory Visit: Payer: Self-pay

## 2019-10-12 DIAGNOSIS — R760 Raised antibody titer: Secondary | ICD-10-CM

## 2019-10-12 DIAGNOSIS — Z7901 Long term (current) use of anticoagulants: Secondary | ICD-10-CM | POA: Diagnosis not present

## 2019-10-12 LAB — POCT INR: INR: 1.6 — AB (ref 2.0–3.0)

## 2019-10-12 NOTE — Patient Instructions (Signed)
Pre visit review using our clinic review tool, if applicable. No additional management support is needed unless otherwise documented below in the visit note.  Continue to take 2 tablets daily except take 1 tablet on Sun and Thurs. Pt is taking doxycycline.  Re-check in 1 week per patient request. Re-check in 1 week.

## 2019-10-13 ENCOUNTER — Ambulatory Visit: Payer: BC Managed Care – PPO

## 2019-10-13 ENCOUNTER — Ambulatory Visit: Payer: BC Managed Care – PPO | Admitting: Psychology

## 2019-10-18 ENCOUNTER — Other Ambulatory Visit: Payer: Self-pay

## 2019-10-19 ENCOUNTER — Ambulatory Visit: Payer: BC Managed Care – PPO | Admitting: General Practice

## 2019-10-19 ENCOUNTER — Ambulatory Visit: Payer: BC Managed Care – PPO

## 2019-10-19 DIAGNOSIS — R760 Raised antibody titer: Secondary | ICD-10-CM

## 2019-10-19 DIAGNOSIS — Z7901 Long term (current) use of anticoagulants: Secondary | ICD-10-CM

## 2019-10-19 LAB — POCT INR: INR: 3 (ref 2.0–3.0)

## 2019-10-19 NOTE — Patient Instructions (Signed)
Pre visit review using our clinic review tool, if applicable. No additional management support is needed unless otherwise documented below in the visit note.  Hold coumadin today and take 1 tablet Thursday and Friday.  On Saturday resume normal dosing and re-check in 1 week.

## 2019-10-20 NOTE — Progress Notes (Signed)
Office Visit Note  Patient: [name]             Date of Birth: 1960-09-28           MRN: [id number]             PCP: Burnis Medin, MD Referring: Burnis Medin, MD Visit Date: 11/02/2019 Occupation: @GUAROCC @  Subjective:  Medication management   History of Present Illness: [name] is a 59 y.o. female with history of rheumatoid arthritis and osteoarthritis.  She states she had to go off her methotrexate due to an infection in her tooth after dental work.  She was off methotrexate for about 3 weeks without any flare.  She resume methotrexate recently.  She states while she was off methotrexate she started feeling more stiffness in her joints.  She denies any joint swelling.  She states she is also had few urinary tract infections.  Activities of Daily Living:  Patient reports morning stiffness for 30  minutes.   Patient Denies nocturnal pain.  Difficulty dressing/grooming: Denies Difficulty climbing stairs: Denies Difficulty getting out of chair: Denies Difficulty using hands for taps, buttons, cutlery, and/or writing: Denies  Review of Systems  Constitutional: Positive for fatigue. Negative for night sweats, weight gain and weight loss.  HENT: Positive for mouth sores. Negative for trouble swallowing, trouble swallowing, mouth dryness and nose dryness.   Eyes: Positive for dryness. Negative for pain, redness, itching and visual disturbance.  Respiratory: Negative for cough, shortness of breath and difficulty breathing.   Cardiovascular: Negative for chest pain, palpitations, hypertension, irregular heartbeat and swelling in legs/feet.  Gastrointestinal: Negative for blood in stool, constipation and diarrhea.  Endocrine: Negative for increased urination.  Genitourinary: Negative for difficulty urinating and vaginal dryness.  Musculoskeletal: Positive for arthralgias, joint pain, joint swelling and morning stiffness. Negative for myalgias, muscle weakness,  muscle tenderness and myalgias.  Skin: Negative for color change, rash, hair loss, redness, skin tightness, ulcers and sensitivity to sunlight.  Allergic/Immunologic: Negative for susceptible to infections.  Neurological: Negative for dizziness, numbness, headaches, memory loss, night sweats and weakness.  Hematological: Negative for bruising/bleeding tendency and swollen glands.  Psychiatric/Behavioral: Negative for depressed mood, confusion and sleep disturbance. The patient is not nervous/anxious.     PMFS History:  Patient Active Problem List   Diagnosis Date Noted  . 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[place]) 01/06/2018  . Long term (current) use of anticoagulants 02/11/2017  . Sicca syndrome ([place]) 12/22/2016  . ANA positive 12/22/2016  . 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 Migraine 03/31/2016  . Anxiety 03/31/2016  . High risk medication use 03/31/2016  . Primary osteoarthritis of both feet 03/31/2016  . Primary osteoarthritis of both knees 03/31/2016  . Stiffness of left hand joint 11/27/2014  . Elevated IOP 06/09/2014  . Long term current use of systemic steroids 06/09/2014  . Medically complex patient 06/09/2014  . Current use of steroid medication 01/09/2014  . Xerosis of skin 12/07/2013  . Encounter for therapeutic drug monitoring 06/09/2013  . B12 deficiency 05/31/2013  . Iron deficiency 05/31/2013  . Visit for preventive health examination 05/25/2012  . Mammogram abnormal 04/12/2012  . 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 Vaginal atrophy 01/23/2012  . History of recurrent UTI (urinary tract infection) 01/23/2012  . Osteoporosis 04/29/2011  . Nonspecific abnormal results of liver function study 12/08/2010  . Chronic anticoagulation   . Anticoagulant long-term use 07/15/2010  . 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 Chronic  cough 03/19/2009  . CHEST PAIN-UNSPECIFIED 08/26/2008  . PALPITATIONS, RECURRENT 07/24/2008  . Vitamin D deficiency  05/30/2008  . HYPERLIPIDEMIA 05/30/2008  . IRON DEFICIENCY 05/30/2008  . Adjustment disorder with anxiety 05/30/2008  . Rheumatoid arthritis ([place]) 05/30/2008  . PULMONARY EMBOLISM, HX OF 05/30/2008    Past Medical History:  Diagnosis Date  . Adjustment disorder with anxiety   . B12 deficiency due to diet    is a vegetarian  . Chest pain, unspecified   . Chronic anticoagulation   . Headache(784.0)    migraines and head pain  . Hx of varicella    As Child  . Iron deficiency anemia, unspecified   . Otalgia, unspecified   . Other and unspecified hyperlipidemia   . Palpitations   . Personal history of venous thrombosis and embolism   . Recurrent UTI   . Rheumatoid arthritis(714.0)    deveshewar  . Unspecified vitamin D deficiency   . UTI (lower urinary tract infection) 11/28/2011   citrobacter       Family History  Problem Relation Age of Onset  . Dementia Father   . Stroke Father   . Hyperlipidemia Father   . Diabetes Father   . Parkinsonism Mother   . Lymphoma Other   . Colon cancer Neg Hx    Past Surgical History:  Procedure Laterality Date  . ORIF ULNAR FRACTURE    . TYMPANOSTOMY TUBE PLACEMENT  2009   Right   Social History   Social History Narrative   Regular Exercise-no   20 yrs in [place]   Son at [employer]   From [place].   Married HH  of 3    G1P1            Immunization History  Administered Date(s) Administered  . Influenza,inj,Quad PF,6+ Mos 04/19/2013, 06/27/2015, 03/05/2016, 02/11/2017, 03/15/2018, 02/23/2019  . PPD Test 07/21/2016  . Tdap 05/31/2013     Objective: Vital Signs: BP 106/66 (BP Location: Right Arm, Patient Position: Sitting, Cuff Size: Normal)   Pulse (!) 54   Resp 15   Ht 5' 3.5" (1.613 m)   Wt 161 lb 14.4 oz (73.4 kg)   BMI 28.23 kg/m    Physical Exam Vitals and nursing note reviewed.  Constitutional:      Appearance: She is well-developed.  HENT:     Head: Normocephalic and atraumatic.  Eyes:     Conjunctiva/sclera:  Conjunctivae normal.  Cardiovascular:     Rate and Rhythm: Normal rate and regular rhythm.     Heart sounds: Normal heart sounds.  Pulmonary:     Effort: Pulmonary effort is normal.     Breath sounds: Normal breath sounds.  Abdominal:     General: Bowel sounds are normal.     Palpations: Abdomen is soft.  Musculoskeletal:     Cervical back: Normal range of motion.  Lymphadenopathy:     Cervical: No cervical adenopathy.  Skin:    General: Skin is warm and dry.     Capillary Refill: Capillary refill takes less than 2 seconds.  Neurological:     Mental Status: She is alert and oriented to person, place, and time.  Psychiatric:        Behavior: Behavior normal.      Musculoskeletal Exam: C-spine, thoracic and lumbar spine with good range of motion.  Shoulder joints, elbow joints, wrist joints, MCPs PIPs and DIPs with good range of motion with no synovitis.  Hip joints, knee joints, ankles, MTPs and PIPs with good  range of motion with no synovitis.  CDAI Exam: CDAI Score: 0.6  Patient Global: 3 mm; Provider Global: 3 mm Swollen: 0 ; Tender: 0  Joint Exam 11/02/2019   No joint exam has been documented for this visit   There is currently no information documented on the homunculus. Go to the Rheumatology activity and complete the homunculus joint exam.  Investigation: No additional findings.  Imaging: MM 3D SCREEN BREAST BILATERAL  Result Date: 10/10/2019 CLINICAL DATA:  Screening. EXAM: DIGITAL SCREENING BILATERAL MAMMOGRAM WITH TOMO AND CAD COMPARISON:  Previous exam(s). ACR Breast Density Category b: There are scattered areas of fibroglandular density. FINDINGS: There are no findings suspicious for malignancy. Images were processed with CAD. IMPRESSION: No mammographic evidence of malignancy. A result letter of this screening mammogram will be mailed directly to the patient. RECOMMENDATION: Screening mammogram in one year. (Code:SM-B-01Y) BI-RADS CATEGORY  1: Negative.  Electronically Signed   By: Claudie Revering M.D.   On: 10/10/2019 12:24   XR Foot 2 Views Left  Result Date: 11/02/2019 First MTP, PIP and DIP narrowing was noted.  No intertarsal or tibiotalar joint space narrowing was noted.  Inferior and posterior calcaneal spurs were noted.  A foreign object was noted in the calcaneum.  Juxta-articular osteopenia was noted.  No interval change was noted when compared to the x-rays of June 01, 2017. Impression: These findings are consistent with osteoarthritis and rheumatoid arthritis overlap.  XR Foot 2 Views Right  Result Date: 11/02/2019 First MTP, PIP and DIP narrowing was noted.  Juxta-articular osteopenia was noted.  No intertarsal or tibiotalar joint space narrowing was noted.  Inferior and posterior calcaneal spurs were noted.  No radiographic progression noted when compared to the x-rays of June 01 2017. Impression: These findings are consistent with rheumatoid arthritis and osteoarthritis overlap.  XR Hand 2 View Left  Result Date: 11/02/2019 Hardware was noted in the left ulna.  Juxta-articular osteopenia was noted.  PIP and DIP narrowing was noted.  No significant MCP intercarpal or radiocarpal joint space narrowing was noted.  No erosive changes were noted.  No radiographic progression was noted when compared to the films of June 01, 2017. Impression: These findings are consistent with the rheumatoid arthritis and osteoarthritis overlap.  XR Hand 2 View Right  Result Date: 11/02/2019 Juxta-articular osteopenia was noted.  No MCP intercarpal radiocarpal joint space narrowing was noted.  PIP and DIP narrowing was noted.  A cystic versus erosive change was noted over the ulnar styloid.  No radiographic progression was noted from the films of June 01, 2017. Impression: These findings are consistent with rheumatoid arthritis and osteoarthritis overlap.  XR KNEE 3 VIEW LEFT  Result Date: 11/02/2019 Moderate medial compartment narrowing with  intercondylar osteophytes were noted.  Moderate patellofemoral narrowing was noted.  No chondrocalcinosis was noted. Impression: These findings are consistent with moderate osteoarthritis and moderate chondromalacia patella.  XR KNEE 3 VIEW RIGHT  Result Date: 11/02/2019 Moderate medial compartment narrowing and moderate patellofemoral narrowing was noted.  No chondrocalcinosis was noted. Impression: These findings are consistent with moderate osteoarthritis and moderate chondromalacia patella.   Recent Labs: Lab Results  Component Value Date   WBC 7.7 09/13/2019   HGB 14.0 09/13/2019   PLT 232.0 09/13/2019   NA 137 09/13/2019   K 4.0 09/13/2019   CL 101 09/13/2019   CO2 30 09/13/2019   GLUCOSE 90 09/13/2019   BUN 10 09/13/2019   CREATININE 0.76 09/13/2019   BILITOT 1.0 09/13/2019   ALKPHOS  83 09/13/2019   AST 15 09/13/2019   ALT 10 09/13/2019   PROT 7.0 09/13/2019   ALBUMIN 4.1 09/13/2019   CALCIUM 9.0 09/13/2019   GFRAA 90 04/08/2019    Speciality Comments: PLQ Eye Exam: 09/26/2019 WNL @ [facility] Follow up 6 months  Procedures:  No procedures performed Allergies: Boniva [ibandronate sodium], Ivp dye [iodinated diagnostic agents], Other, and Risedronate sodium [risedronate sodium]   Assessment / Plan:     Visit Diagnoses: Rheumatoid arthritis of multiple sites with negative rheumatoid factor (HCC) - RF negative, anti-CCP positive, ANA positive -patient had no synovitis on my examination today.  She states she had to come off medications due to an infection for about 3 weeks and felt more and stiffness in her joints.  She resumed her medications about a week ago.  She had no synovitis on examination today.  We obtained x-rays today for comparison.  The x-rays were consistent with rheumatoid arthritis and osteoarthritis overlap without any radiographic progression when compared to the films of 2019.  Plan: XR Hand 2 View Right, XR Hand 2 View Left, XR Foot 2 Views  Right, XR Foot 2 Views Left  High risk medication use - Plaquenil 200 mg by mouth daily, Methotrexate 8 tablets by mouth every week, and folic acid 2 mg by mouth daily.  Eye Exam: 07/01/2018.  Her labs are stable.  We will continue to monitor labs every 3 months.  Primary osteoarthritis of both knees  Chronic pain of both knees -she complains of increasing stiffness and discomfort in her knee joints.  Her x-rays were consistent with osteoarthritis and chondromalacia patella.  The x-ray findings were reviewed.  Joint protection muscle strengthening was discussed.  Plan: XR KNEE 3 VIEW LEFT, XR KNEE 3 VIEW RIGHT  Primary osteoarthritis of both feet-she had no synovitis on my examination.  Sicca syndrome (HCC)-over-the-counter products were discussed.  Age related osteoporosis, unspecified pathological fracture presence - She discontinued fosamax in May 2018 due to worsening reflux. DEXA July 2018 left femur BMD 0.736 with the change of -0.3% T score of -2.5.  Patient is concerned about getting DEXA due to the pandemic.  She is immunized now.  Have advised her to get DEXA scan.  She wants to get it through her PCP.  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  History of pulmonary embolism-she is on Coumadin.  History of hyperlipidemia  History of anemia  History of migraine    Orders: Orders Placed This Encounter  Procedures  . XR Hand 2 View Right  . XR Hand 2 View Left  . XR Foot 2 Views Right  . XR Foot 2 Views Left  . XR KNEE 3 VIEW LEFT  . XR KNEE 3 VIEW RIGHT   No orders of the defined types were placed in this encounter.     Follow-Up Instructions: Return for Rheumatoid arthritis.   Bo Merino, MD  Note - This record has been created using Editor, commissioning.  Chart creation errors have been sought, but may not always  have been located. Such creation errors do not reflect on  the standard of medical care.

## 2019-10-25 ENCOUNTER — Other Ambulatory Visit: Payer: Self-pay

## 2019-10-26 ENCOUNTER — Ambulatory Visit: Payer: BC Managed Care – PPO

## 2019-10-26 ENCOUNTER — Ambulatory Visit (INDEPENDENT_AMBULATORY_CARE_PROVIDER_SITE_OTHER): Payer: BC Managed Care – PPO | Admitting: General Practice

## 2019-10-26 ENCOUNTER — Other Ambulatory Visit: Payer: Self-pay

## 2019-10-26 DIAGNOSIS — R760 Raised antibody titer: Secondary | ICD-10-CM

## 2019-10-26 DIAGNOSIS — Z7901 Long term (current) use of anticoagulants: Secondary | ICD-10-CM | POA: Diagnosis not present

## 2019-10-26 LAB — POCT INR: INR: 1.7 — AB (ref 2.0–3.0)

## 2019-10-26 NOTE — Patient Instructions (Signed)
Pre visit review using our clinic review tool, if applicable. No additional management support is needed unless otherwise documented below in the visit note.  Take 2 tablets today, 1 tablet Thursday, 2 tablets Friday and 1 tablet on Saturday.  On Sun go back to 2 tablets daily except 1 tablet on Sunday and Thursday.  Re-check in 4 weeks.

## 2019-10-27 ENCOUNTER — Ambulatory Visit (INDEPENDENT_AMBULATORY_CARE_PROVIDER_SITE_OTHER): Payer: BC Managed Care – PPO | Admitting: Psychology

## 2019-10-27 DIAGNOSIS — F411 Generalized anxiety disorder: Secondary | ICD-10-CM

## 2019-11-02 ENCOUNTER — Ambulatory Visit: Payer: Self-pay

## 2019-11-02 ENCOUNTER — Ambulatory Visit (INDEPENDENT_AMBULATORY_CARE_PROVIDER_SITE_OTHER): Payer: BC Managed Care – PPO | Admitting: Rheumatology

## 2019-11-02 ENCOUNTER — Other Ambulatory Visit: Payer: Self-pay

## 2019-11-02 ENCOUNTER — Encounter: Payer: Self-pay | Admitting: Rheumatology

## 2019-11-02 VITALS — BP 106/66 | HR 54 | Resp 15 | Ht 63.5 in | Wt 161.9 lb

## 2019-11-02 DIAGNOSIS — Z862 Personal history of diseases of the blood and blood-forming organs and certain disorders involving the immune mechanism: Secondary | ICD-10-CM

## 2019-11-02 DIAGNOSIS — H40053 Ocular hypertension, bilateral: Secondary | ICD-10-CM

## 2019-11-02 DIAGNOSIS — M3500 Sicca syndrome, unspecified: Secondary | ICD-10-CM

## 2019-11-02 DIAGNOSIS — Z79899 Other long term (current) drug therapy: Secondary | ICD-10-CM

## 2019-11-02 DIAGNOSIS — M19072 Primary osteoarthritis, left ankle and foot: Secondary | ICD-10-CM

## 2019-11-02 DIAGNOSIS — M810 Age-related osteoporosis without current pathological fracture: Secondary | ICD-10-CM

## 2019-11-02 DIAGNOSIS — M25561 Pain in right knee: Secondary | ICD-10-CM | POA: Diagnosis not present

## 2019-11-02 DIAGNOSIS — Z8669 Personal history of other diseases of the nervous system and sense organs: Secondary | ICD-10-CM

## 2019-11-02 DIAGNOSIS — Z86711 Personal history of pulmonary embolism: Secondary | ICD-10-CM

## 2019-11-02 DIAGNOSIS — Z8639 Personal history of other endocrine, nutritional and metabolic disease: Secondary | ICD-10-CM

## 2019-11-02 DIAGNOSIS — G8929 Other chronic pain: Secondary | ICD-10-CM

## 2019-11-02 DIAGNOSIS — M25562 Pain in left knee: Secondary | ICD-10-CM | POA: Diagnosis not present

## 2019-11-02 DIAGNOSIS — M19071 Primary osteoarthritis, right ankle and foot: Secondary | ICD-10-CM | POA: Diagnosis not present

## 2019-11-02 DIAGNOSIS — M170 Bilateral primary osteoarthritis of knee: Secondary | ICD-10-CM | POA: Diagnosis not present

## 2019-11-02 DIAGNOSIS — M0609 Rheumatoid arthritis without rheumatoid factor, multiple sites: Secondary | ICD-10-CM

## 2019-11-02 NOTE — Patient Instructions (Signed)
Standing Labs We placed an order today for your standing lab work.   Please have your standing labs drawn in July and every 3 months  If possible, please have your labs drawn 2 weeks prior to your appointment so that the provider can discuss your results at your appointment.  We have open lab daily Monday through Thursday from 8:30-12:30 PM and 1:30-4:30 PM and Friday from 8:30-12:30 PM and 1:30-4:00 PM at the office of Dr. Bo Merino, [facility].   You may experience shorter wait times on Monday and Friday afternoons. The office is located at [address] [place], [place], [place] [address] No appointment is necessary.   Labs are drawn by [facility].  You may receive a bill from [employer] for your lab work.  If you wish to have your labs drawn at another location, please call the office 24 hours in advance to send orders.  If you have any questions regarding directions or hours of operation,  please call [phone].   As a reminder, please drink plenty of water prior to coming for your lab work. Thanks!

## 2019-11-03 DIAGNOSIS — E2839 Other primary ovarian failure: Secondary | ICD-10-CM

## 2019-11-03 DIAGNOSIS — M810 Age-related osteoporosis without current pathological fracture: Secondary | ICD-10-CM

## 2019-11-04 NOTE — Telephone Encounter (Signed)
Order has been placed.

## 2019-11-07 ENCOUNTER — Ambulatory Visit: Payer: BC Managed Care – PPO

## 2019-11-10 ENCOUNTER — Ambulatory Visit (INDEPENDENT_AMBULATORY_CARE_PROVIDER_SITE_OTHER): Payer: BC Managed Care – PPO | Admitting: Psychology

## 2019-11-10 DIAGNOSIS — F411 Generalized anxiety disorder: Secondary | ICD-10-CM | POA: Diagnosis not present

## 2019-11-23 ENCOUNTER — Other Ambulatory Visit: Payer: Self-pay

## 2019-11-23 ENCOUNTER — Ambulatory Visit (INDEPENDENT_AMBULATORY_CARE_PROVIDER_SITE_OTHER): Payer: BC Managed Care – PPO | Admitting: General Practice

## 2019-11-23 DIAGNOSIS — R760 Raised antibody titer: Secondary | ICD-10-CM

## 2019-11-23 DIAGNOSIS — Z7901 Long term (current) use of anticoagulants: Secondary | ICD-10-CM

## 2019-11-23 LAB — POCT INR: INR: 1.8 — AB (ref 2.0–3.0)

## 2019-11-23 NOTE — Patient Instructions (Addendum)
Pre visit review using our clinic review tool, if applicable. No additional management support is needed unless otherwise documented below in the visit note.  Continue to take 2 tablets daily except 1 tablet on Sunday and Thursday.  Re-check in 4 to 6 weeks.    

## 2019-11-24 ENCOUNTER — Ambulatory Visit (INDEPENDENT_AMBULATORY_CARE_PROVIDER_SITE_OTHER): Payer: BC Managed Care – PPO | Admitting: Psychology

## 2019-11-24 DIAGNOSIS — F411 Generalized anxiety disorder: Secondary | ICD-10-CM | POA: Diagnosis not present

## 2019-12-08 ENCOUNTER — Ambulatory Visit (INDEPENDENT_AMBULATORY_CARE_PROVIDER_SITE_OTHER): Payer: BC Managed Care – PPO | Admitting: Psychology

## 2019-12-08 DIAGNOSIS — F411 Generalized anxiety disorder: Secondary | ICD-10-CM | POA: Diagnosis not present

## 2019-12-19 ENCOUNTER — Other Ambulatory Visit: Payer: Self-pay

## 2019-12-19 ENCOUNTER — Other Ambulatory Visit: Payer: Self-pay | Admitting: Rheumatology

## 2019-12-19 DIAGNOSIS — M0609 Rheumatoid arthritis without rheumatoid factor, multiple sites: Secondary | ICD-10-CM

## 2019-12-19 NOTE — Telephone Encounter (Signed)
Message routed to PCP CMA  

## 2019-12-19 NOTE — Telephone Encounter (Signed)
Last Visit: 11/02/2019 Next Visit: 04/04/2020 Labs: 09/13/2019 WNL Eye exam:  09/26/2019 WNL   Current Dose per office note 11/02/2019:  Plaquenil 200 mg by mouth daily, Methotrexate 8 tablets by mouth every week DX: Rheumatoid arthritis of multiple sites with negative rheumatoid factor   Okay to refill Plaquenil and MTX?

## 2019-12-19 NOTE — Telephone Encounter (Signed)
Last visit 08/18/2019 (Virtual Visit)  Last filled 11/23/2018, # 60 with 0 refills

## 2019-12-21 MED ORDER — ALPRAZOLAM 0.25 MG PO TABS: 0.2500 mg | ORAL_TABLET | Freq: Two times a day (BID) | ORAL | 0 refills | Status: DC | PRN

## 2019-12-22 ENCOUNTER — Ambulatory Visit (INDEPENDENT_AMBULATORY_CARE_PROVIDER_SITE_OTHER): Payer: BC Managed Care – PPO | Admitting: Psychology

## 2019-12-22 DIAGNOSIS — F411 Generalized anxiety disorder: Secondary | ICD-10-CM | POA: Diagnosis not present

## 2020-01-02 ENCOUNTER — Telehealth: Payer: Self-pay | Admitting: Rheumatology

## 2020-01-02 ENCOUNTER — Other Ambulatory Visit: Payer: Self-pay

## 2020-01-02 ENCOUNTER — Ambulatory Visit (INDEPENDENT_AMBULATORY_CARE_PROVIDER_SITE_OTHER): Payer: BC Managed Care – PPO | Admitting: General Practice

## 2020-01-02 DIAGNOSIS — R760 Raised antibody titer: Secondary | ICD-10-CM | POA: Diagnosis not present

## 2020-01-02 DIAGNOSIS — Z7901 Long term (current) use of anticoagulants: Secondary | ICD-10-CM

## 2020-01-02 DIAGNOSIS — M0609 Rheumatoid arthritis without rheumatoid factor, multiple sites: Secondary | ICD-10-CM

## 2020-01-02 LAB — POCT INR: INR: 1.5 — AB (ref 2.0–3.0)

## 2020-01-02 NOTE — Patient Instructions (Signed)
Pre visit review using our clinic review tool, if applicable. No additional management support is needed unless otherwise documented below in the visit note.  Continue to take 2 tablets daily except 1 tablet on Sunday and Thursday.  Re-check in 4 to 6 weeks.    

## 2020-01-02 NOTE — Telephone Encounter (Signed)
Patient called stating she would like a virtual appointment with Dr. Estanislado Pandy to discuss the booster vaccine.  Patient states her husband will be returning to work in the office in 1 week.  Patient states she read where the CDC said "people like her were 3 times more likely to get the new strain than a regular person."  Patient states she wants to make an informed decision and would like to have a "short discussion" with Dr. Estanislado Pandy.

## 2020-01-02 NOTE — Telephone Encounter (Signed)
Please give her information on the CDC guidelines.

## 2020-01-03 NOTE — Telephone Encounter (Signed)
Attempted to contact the patient and left message for patient to call the office.  

## 2020-01-03 NOTE — Telephone Encounter (Signed)
Rheumatoid factor and CCP do not correlate with the disease process and not necessary to be repeated.  We can do a sedimentation rate which is a marker for inflammation

## 2020-01-03 NOTE — Telephone Encounter (Signed)
Reviewed the following with patient   COVID-19 vaccine recommendations:   COVID-19 vaccine is recommended for everyone (unless you are allergic to a vaccine component), even if you are on a medication that suppresses your immune system.   If you are on Methotrexate, Cellcept (mycophenolate), Rinvoq, Morrie Sheldon, and Olumiant- hold the medication for 1 week after each vaccine. Hold Methotrexate for 2 weeks after the single dose COVID-19 vaccine.    Do not take Tylenol or any anti-inflammatory medications (NSAIDs) 24 hours prior to the COVID-19 vaccination.   There is no direct evidence about the efficacy of the COVID-19 vaccine in individuals who are on medications that suppress the immune system.   Even if you are fully vaccinated, and you are on any medications that suppress your immune system, please continue to wear a mask, maintain at least six feet social distance and practice hand hygiene.   If you develop a COVID-19 infection, please contact your PCP or our office to determine if you need antibody infusion.  The booster vaccine is now available for immunocompromised patients. It is advised that if you had Pfizer vaccine you should get [employer] booster.  If you had a Moderna vaccine then you should get a Moderna booster. Johnson and Wynetta Emery does not have a booster vaccine at this time.  Please see the following web sites for updated information.   https://www.[email or web address].org/Portals/0/Files/COVID-19-Vaccination-Patient-Resources.pdf  https://[email or web address].org/About-Us/Newsroom/Press-Releases/ID/1159

## 2020-01-03 NOTE — Telephone Encounter (Signed)
Patient states she is having pain in her upper right side towards her back. Patient states it has been going on for a while. Patient states she is coming for her standing labs soon and would like to know if you would like to add any additional labs work. Patient states it has been over a year since her RF and CCP have been checked. Please advise.

## 2020-01-03 NOTE — Telephone Encounter (Signed)
Attempted to contact patient and left message for patient to call the office.  

## 2020-01-03 NOTE — Telephone Encounter (Signed)
Patient advised Rheumatoid factor and CCP do not correlate with the disease process and not necessary to be repeated.  Patient advised we can do a sedimentation rate which is a marker for inflammation.Future order placed.

## 2020-01-03 NOTE — Addendum Note (Signed)
Addended by: Carole Binning on: 01/03/2020 01:46 PM   Modules accepted: Orders

## 2020-01-05 ENCOUNTER — Ambulatory Visit (INDEPENDENT_AMBULATORY_CARE_PROVIDER_SITE_OTHER): Payer: BC Managed Care – PPO | Admitting: Psychology

## 2020-01-05 DIAGNOSIS — F411 Generalized anxiety disorder: Secondary | ICD-10-CM | POA: Diagnosis not present

## 2020-01-06 ENCOUNTER — Other Ambulatory Visit: Payer: Self-pay

## 2020-01-06 DIAGNOSIS — M0609 Rheumatoid arthritis without rheumatoid factor, multiple sites: Secondary | ICD-10-CM

## 2020-01-06 DIAGNOSIS — Z79899 Other long term (current) drug therapy: Secondary | ICD-10-CM

## 2020-01-06 LAB — COMPLETE METABOLIC PANEL WITH GFR
AG Ratio: 1.4 (calc) (ref 1.0–2.5)
ALT: 12 U/L (ref 6–29)
AST: 19 U/L (ref 10–35)
Albumin: 4.1 g/dL (ref 3.6–5.1)
Alkaline phosphatase (APISO): 77 U/L (ref 37–153)
BUN: 13 mg/dL (ref 7–25)
CO2: 26 mmol/L (ref 20–32)
Calcium: 9.4 mg/dL (ref 8.6–10.4)
Chloride: 104 mmol/L (ref 98–110)
Creat: 0.89 mg/dL (ref 0.50–0.99)
GFR, Est African American: 81 mL/min/{1.73_m2} (ref >=60)
GFR, Est Non African American: 70 mL/min/{1.73_m2} (ref >=60)
Globulin: 2.9 g/dL (calc) (ref 1.9–3.7)
Glucose, Bld: 88 mg/dL (ref 65–99)
Potassium: 4.3 mmol/L (ref 3.5–5.3)
Sodium: 138 mmol/L (ref 135–146)
Total Bilirubin: 0.9 mg/dL (ref 0.2–1.2)
Total Protein: 7 g/dL (ref 6.1–8.1)

## 2020-01-06 LAB — CBC WITH DIFFERENTIAL/PLATELET
Absolute Monocytes: 650 cells/uL (ref 200–950)
Basophils Absolute: 58 cells/uL (ref 0–200)
Basophils Relative: 0.8 %
Eosinophils Absolute: 110 cells/uL (ref 15–500)
Eosinophils Relative: 1.5 %
HCT: 43.1 % (ref 35.0–45.0)
Hemoglobin: 14.3 g/dL (ref 11.7–15.5)
Lymphs Abs: 2672 cells/uL (ref 850–3900)
MCH: 28 pg (ref 27.0–33.0)
MCHC: 33.2 g/dL (ref 32.0–36.0)
MCV: 84.5 fL (ref 80.0–100.0)
MPV: 11.1 fL (ref 7.5–12.5)
Monocytes Relative: 8.9 %
Neutro Abs: 3811 cells/uL (ref 1500–7800)
Neutrophils Relative %: 52.2 %
Platelets: 267 10*3/uL (ref 140–400)
RBC: 5.1 10*6/uL (ref 3.80–5.10)
RDW: 13.1 % (ref 11.0–15.0)
Total Lymphocyte: 36.6 %
WBC: 7.3 10*3/uL (ref 3.8–10.8)

## 2020-01-07 LAB — SEDIMENTATION RATE: Sed Rate: 11 mm/h (ref 0–30)

## 2020-01-09 NOTE — Progress Notes (Signed)
CBC and CMP WNL

## 2020-01-09 NOTE — Progress Notes (Signed)
Sed rate is normal

## 2020-01-09 NOTE — Telephone Encounter (Signed)
I think it  is a good idea to get a 3rd dose  At any time.   We dont have a marker for  Antibody titers  For  What is good enough immunity for best protection.    You can make a video visit if you want to discuss this

## 2020-01-19 ENCOUNTER — Ambulatory Visit (INDEPENDENT_AMBULATORY_CARE_PROVIDER_SITE_OTHER): Payer: BC Managed Care – PPO | Admitting: Psychology

## 2020-01-19 DIAGNOSIS — F411 Generalized anxiety disorder: Secondary | ICD-10-CM | POA: Diagnosis not present

## 2020-01-20 ENCOUNTER — Other Ambulatory Visit: Payer: Self-pay

## 2020-01-20 ENCOUNTER — Telehealth: Payer: Self-pay | Admitting: Internal Medicine

## 2020-01-20 MED ORDER — CIPROFLOXACIN HCL 500 MG PO TABS
500.0000 mg | ORAL_TABLET | Freq: Two times a day (BID) | ORAL | 0 refills | Status: DC
Start: 2020-01-20 — End: 2020-02-08

## 2020-01-20 NOTE — Telephone Encounter (Signed)
Called patient and let her know that I have sent this prescription to her pharmacy. Gave patient the message from Dr. Regis Bill. Patient verbalized an understanding and stated that she will let us know how she is on Tuesday.

## 2020-01-20 NOTE — Telephone Encounter (Signed)
Ok to send in cipro 500 mg 1 po bid disp 6  To use only if needed  Over  Holiday weekend   If recurrent of such we can do ua with micro and culture at [facility] if needed

## 2020-01-20 NOTE — Telephone Encounter (Signed)
Pt wants some advise. She thinks she has a UTI and wants to know what to do about it.  Please call pt   Please advise

## 2020-01-20 NOTE — Telephone Encounter (Signed)
Started this afternoon, started having bladder pain just now and having urinary frequency. Patient stated that she has recurrent UTI's and she stated that she is not having any burning symptoms. Patient wants to know if she can get something to help her if it gets worse over the weekend. Patient stated that she had this about 5 months ago.  Please advise.

## 2020-01-26 NOTE — Progress Notes (Signed)
Chief Complaint  Patient presents with  . Back Pain    right side back pain and front abdominal pain on right side and seems worse after she eats, worse when lays down at night    HPI: [name] 59 y.o. come in for     Right back pain  recent off and on worse when lays down but not assoc with eating vomiting fever .  Didn't take antibiotic see last note    Has ocas sting  Cyst on r kidney in past followed in past .  Also  has  Very localized spot right lower abd area  Pain discomfort not severe but  Concern  No fever chills gross hematuria  To have f/u dr Zigmund Daniel( review recommendations) and then urology  Stopped mtx temp for covid booster  ROS: See pertinent positives and negatives per HPI.  Past Medical History:  Diagnosis Date  . Adjustment disorder with anxiety   . B12 deficiency due to diet    is a vegetarian  . Chest pain, unspecified   . Chronic anticoagulation   . Headache(784.0)    migraines and head pain  . Hx of varicella    As Child  . Iron deficiency anemia, unspecified   . Otalgia, unspecified   . Other and unspecified hyperlipidemia   . Palpitations   . Personal history of venous thrombosis and embolism   . Recurrent UTI   . Rheumatoid arthritis(714.0)    deveshewar  . Unspecified vitamin D deficiency   . UTI (lower urinary tract infection) 11/28/2011   citrobacter       Family History  Problem Relation Age of Onset  . Dementia Father   . Stroke Father   . Hyperlipidemia Father   . Diabetes Father   . Parkinsonism Mother   . Lymphoma Other   . Colon cancer Neg Hx     Social History   Socioeconomic History  . Marital status: Married    Spouse name: Not on file  . Number of children: 1  . Years of education: Not on file  . Highest education level: Not on file  Occupational History  . Occupation: home maker  Tobacco Use  . Smoking status: Never Smoker  . Smokeless tobacco: Never Used  Vaping Use  . Vaping Use: Never used    Substance and Sexual Activity  . Alcohol use: No  . Drug use: Never  . Sexual activity: Not on file  Other Topics Concern  . Not on file  Social History Narrative   Regular Exercise-no   20 yrs in [place]   Son at [facility]   From [place].   Married HH  of 3    G1P1            Social Determinants of Health   Financial Resource Strain:   . Difficulty of Paying Living Expenses: Not on file  Food Insecurity:   . Worried About Charity fundraiser in the Last Year: Not on file  . Ran Out of Food in the Last Year: Not on file  Transportation Needs:   . Lack of Transportation (Medical): Not on file  . Lack of Transportation (Non-Medical): Not on file  Physical Activity:   . Days of Exercise per Week: Not on file  . Minutes of Exercise per Session: Not on file  Stress:   . Feeling of Stress : Not on file  Social Connections:   . Frequency of Communication with Friends and Family: Not  on file  . Frequency of Social Gatherings with Friends and Family: Not on file  . Attends Religious Services: Not on file  . Active Member of Clubs or Organizations: Not on file  . Attends Archivist Meetings: Not on file  . Marital Status: Not on file    Outpatient Medications Prior to Visit  Medication Sig Dispense Refill  . ALPRAZolam (XANAX) 0.25 MG tablet Take 1 tablet (0.25 mg total) by mouth 2 (two) times daily as needed for anxiety. 30 tablet 0  . Calcium Carb-Cholecalciferol (CALTRATE 600+D) 600-800 MG-UNIT TABS Take by mouth.    . Cholecalciferol (VITAMIN D3) 2000 UNITS TABS Take 1 tablet by mouth daily.    . cycloSPORINE (RESTASIS) 0.05 % ophthalmic emulsion Place 1 drop into both eyes daily.      . Eflornithine HCl (VANIQA) 13.9 % cream Apply topically 2 (two) times daily with a meal.    . folic acid (FOLVITE) 1 MG tablet Take 2 tablets (2 mg total) by mouth daily. 180 tablet 3  . hydroxychloroquine (PLAQUENIL) 200 MG tablet TAKE 1 TABLET(200 MG) BY MOUTH DAILY 90 tablet 0  .  methotrexate 2.5 MG tablet TAKE 8 TABLETS BY MOUTH ONCE A WEEK. CAUTION: CHEMOTHERAPY. PROTECT FROM LIGHT 96 tablet 0  . mineral/vitamin supplement (MULTIGEN) 70 MG TABS tablet Take 1 tablet (70 mg total) by mouth daily. 90 tablet 3  . SUMAtriptan (IMITREX) 50 MG tablet TAKE ONE TABLET BY MOUTH AS NEEDED. MAY REPEAT IN 2 TO 4 HOURS IF NEEDED. 9 tablet 5  . warfarin (COUMADIN) 2.5 MG tablet TAKE 2 TABLETS BY MOUTH DAILY, EXCEPT 1 TABLET ON MONDAY AND THURSDAY OR TAKE AS DIRECTED BY ANTICOAGULATION CLINIC 180 tablet 1  . ciprofloxacin (CIPRO) 500 MG tablet Take 1 tablet (500 mg total) by mouth 2 (two) times daily. (Patient not taking: Reported on 01/27/2020) 6 tablet 0   No facility-administered medications prior to visit.     EXAM:  BP 118/76   Pulse (!) 52   Temp 97.9 F (36.6 C) (Oral)   Ht 5' 3.5" (1.613 m)   Wt 163 lb (73.9 kg)   SpO2 97%   BMI 28.42 kg/m   Body mass index is 28.42 kg/m.  GENERAL: vitals reviewed and listed above, alert, oriented, appears well hydrated and in no acute distress HEENT: atraumatic, conjunctiva  clear, no obvious abnormalities on inspection of external nose and ears OP : masked  NECK: no obvious masses on inspection palpation  LUNGS: clear to auscultation bilaterally, no wheezes, rales or rhonchi, good air movement CV: HRRR, no clubbing cyanosis or  peripheral edema nl cap refill  Area of discomfort right mid low r back pain     Abdomen:  Sof,t normal bowel sounds without hepatosplenomegaly, no guarding rebound or masses   Point to rlq are local without mass or hernia  And no g or r  MS: moves all extremities without noticeable focal  abnormality PSYCH: pleasant and cooperative, no obvious depression  Baseline anxiety  Asks   ?s  Lab Results  Component Value Date   WBC 7.3 01/06/2020   HGB 14.3 01/06/2020   HCT 43.1 01/06/2020   PLT 267 01/06/2020   GLUCOSE 88 01/06/2020   CHOL 194 09/13/2019   TRIG 139.0 09/13/2019   HDL 66.20 09/13/2019    LDLDIRECT 97.3 05/25/2013   LDLCALC 100 (H) 09/13/2019   ALT 12 01/06/2020   AST 19 01/06/2020   NA 138 01/06/2020   K 4.3 01/06/2020  CL 104 01/06/2020   CREATININE 0.89 01/06/2020   BUN 13 01/06/2020   CO2 26 01/06/2020   TSH 3.21 09/13/2019   INR 1.5 (A) 01/02/2020   HGBA1C 6.0 07/20/2018   BP Readings from Last 3 Encounters:  01/27/20 118/76  11/02/19 106/66  12/22/18 96/68    ASSESSMENT AND PLAN:  Discussed the following assessment and plan:  Acute right-sided back pain, unspecified back location - Plan: POCT urinalysis dipstick, Culture, Urine, Culture, Urine  Right lower quadrant abdominal tenderness without rebound tenderness  History of recurrent UTI (urinary tract infection)  Long term (current) use of anticoagulants  Medically complex  Exam is reassuring and ua  Nl x DS positive  blood that is "always there"   R/o infection with  ucx   I agree with topical vag estrogen and avoid use of cipro   . depending on resistance pattern  consider  Imagine if .popo sx    Had allergic rx to  One dye in past but others ok  -Patient advised to return or notify health care team  if  new concerns arise.  Patient Instructions   Checking for  Infection that I doubt at this time  Back seems like muscle pain  But will follow up.   Less likely kidney pain  Radiation to abdomen   Consideration of  Scan   To check renal system  But  At this time could wait  Until seen specialist.  Urologist .   Topical estrogen for vaginal area to prevent uti is a good idea .   Standley Brooking. Ciro Tashiro M.D.

## 2020-01-27 ENCOUNTER — Ambulatory Visit (INDEPENDENT_AMBULATORY_CARE_PROVIDER_SITE_OTHER): Payer: BC Managed Care – PPO | Admitting: Internal Medicine

## 2020-01-27 ENCOUNTER — Encounter: Payer: Self-pay | Admitting: Internal Medicine

## 2020-01-27 ENCOUNTER — Other Ambulatory Visit: Payer: Self-pay

## 2020-01-27 VITALS — BP 118/76 | HR 52 | Temp 97.9°F | Ht 63.5 in | Wt 163.0 lb

## 2020-01-27 DIAGNOSIS — M549 Dorsalgia, unspecified: Secondary | ICD-10-CM | POA: Diagnosis not present

## 2020-01-27 DIAGNOSIS — Z87440 Personal history of urinary (tract) infections: Secondary | ICD-10-CM

## 2020-01-27 DIAGNOSIS — R10813 Right lower quadrant abdominal tenderness: Secondary | ICD-10-CM

## 2020-01-27 DIAGNOSIS — Z7901 Long term (current) use of anticoagulants: Secondary | ICD-10-CM | POA: Diagnosis not present

## 2020-01-27 DIAGNOSIS — Z789 Other specified health status: Secondary | ICD-10-CM

## 2020-01-27 LAB — POCT URINALYSIS DIPSTICK
Bilirubin, UA: NEGATIVE
Blood, UA: POSITIVE
Glucose, UA: NEGATIVE
Ketones, UA: NEGATIVE
Leukocytes, UA: NEGATIVE
Nitrite, UA: NEGATIVE
Protein, UA: NEGATIVE
Spec Grav, UA: 1.01 (ref 1.010–1.025)
Urobilinogen, UA: 0.2 E.U./dL
pH, UA: 6.5 (ref 5.0–8.0)

## 2020-01-27 NOTE — Patient Instructions (Addendum)
  Checking for  Infection that I doubt at this time  Back seems like muscle pain  But will follow up.   Less likely kidney pain  Radiation to abdomen   Consideration of  Scan   To check renal system  But  At this time could wait  Until seen specialist.  Urologist .   Topical estrogen for vaginal area to prevent uti is a good idea .

## 2020-01-28 LAB — URINE CULTURE
MICRO NUMBER:: 10935555
SPECIMEN QUALITY:: ADEQUATE

## 2020-01-30 NOTE — Progress Notes (Signed)
So no predominant bacterial , most likely no uti .   But I f ongoing symptoms I would advise a recollection  good clean catch midstream   with prompt processing . At lab  ( ok to give her a sterile collection cup to bring in )

## 2020-02-02 ENCOUNTER — Ambulatory Visit
Admission: RE | Admit: 2020-02-02 | Discharge: 2020-02-02 | Disposition: A | Discharge status: Home / Self Care | Payer: BC Managed Care – PPO | Source: Ambulatory Visit | Attending: Internal Medicine | Admitting: Internal Medicine

## 2020-02-02 ENCOUNTER — Other Ambulatory Visit: Payer: Self-pay

## 2020-02-02 ENCOUNTER — Ambulatory Visit: Payer: BC Managed Care – PPO | Admitting: Psychology

## 2020-02-02 DIAGNOSIS — M810 Age-related osteoporosis without current pathological fracture: Secondary | ICD-10-CM

## 2020-02-02 DIAGNOSIS — E2839 Other primary ovarian failure: Secondary | ICD-10-CM

## 2020-02-02 NOTE — Progress Notes (Signed)
DEXA is consistent with osteoporosis. She will need treatment. We can discuss treatment at the follow-up visit in November.

## 2020-02-08 ENCOUNTER — Encounter: Payer: Self-pay | Admitting: Internal Medicine

## 2020-02-08 ENCOUNTER — Ambulatory Visit (INDEPENDENT_AMBULATORY_CARE_PROVIDER_SITE_OTHER): Payer: BC Managed Care – PPO | Admitting: Internal Medicine

## 2020-02-08 VITALS — BP 106/60 | HR 66 | Ht 63.5 in | Wt 160.2 lb

## 2020-02-08 DIAGNOSIS — M546 Pain in thoracic spine: Secondary | ICD-10-CM

## 2020-02-08 DIAGNOSIS — R1011 Right upper quadrant pain: Secondary | ICD-10-CM | POA: Diagnosis not present

## 2020-02-08 NOTE — Progress Notes (Signed)
HISTORY OF PRESENT ILLNESS:  [name] is a 59 y.o. female, native of [place], with a history of rheumatoid arthritis, hypercoagulable disorder evaluated at [facility] 2015.  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  I saw her initially as a self-referral 3 years ago.  See that dictation for details.  She did have previous colonoscopy with Dr. Earlean Shawl in 2008 which was normal.  At the time of her last visit she was felt to have intermittent reflux symptoms without alarm features for which reflux precautions and on-demand PPI use was endorsed.  There was also extremely detailed discussion on colon cancer screening options.  She has not proceeded with any of the options.  She acknowledges this.  Current complaint is that of right back right side discomfort in the region of the lower scapula.  She has had this for about 3 weeks.  Discomfort seems worse when lying down.  She is able to do her other activities.  She is worried about liver cancer, having had a friend that died with the same.  Her bowel habits tend to fluctuate but are unchanged.  She also points to the right lower quadrant with a vague fleeting discomfort at times.  No bleeding.  Mild reflux as before.  No dysphagia.  Weight unchanged.  She did see her primary care provider regarding the same 12 days ago.  Had urinalysis.  Referred to urologist.  She scheduled this appointment.  Reviewed blood work from January 06, 2020 shows normal comprehensive metabolic panel and normal CBC.  REVIEW OF SYSTEMS:  All non-GI ROS negative unless otherwise stated in the HPI except for arthritis, back pain, fatigue, excessive urination  Past Medical History:  Diagnosis Date  . Adjustment disorder with anxiety   . B12 deficiency due to diet    is a vegetarian  . Chest pain, unspecified   . Chronic anticoagulation   . Headache(784.0)    migraines and head pain  .  Hx of varicella    As Child  . Iron deficiency anemia, unspecified   . Otalgia, unspecified   . Other and unspecified hyperlipidemia   . Palpitations   . Personal history of venous thrombosis and embolism   . Recurrent UTI   . Rheumatoid arthritis(714.0)    deveshewar  . Unspecified vitamin D deficiency   . UTI (lower urinary tract infection) 11/28/2011   citrobacter       Past Surgical History:  Procedure Laterality Date  . ORIF ULNAR FRACTURE    . TYMPANOSTOMY TUBE PLACEMENT  2009   Right    Social History [name]  reports that she has never smoked. She has never used smokeless tobacco. She reports that she does not drink alcohol and does not use drugs.  family history includes Dementia in her father; Diabetes in her father; Hyperlipidemia in her father; Lymphoma in an other family member; Parkinsonism in her mother; Stroke in her father.  Allergies  Allergen Reactions  . Boniva [Ibandronate Sodium]     Heartburn  . Ivp Dye [Iodinated Diagnostic Agents] Swelling    Mouth swelling.  . Other Swelling    Mouth swelling. Tongue   . Risedronate Sodium Other (See Comments)    Foot and bone pain and fatigue Foot and bone pain and fatigue  . Risedronate Sodium [Risedronate Sodium] Other (See Comments)    Foot and bone pain and fatigue  PHYSICAL EXAMINATION: Vital signs: BP 106/60   Pulse 66   Ht 5' 3.5" (1.613 m)   Wt 160 lb 3.2 oz (72.7 kg)   BMI 27.93 kg/m   Constitutional: generally well-appearing, no acute distress Psychiatric: alert and oriented x3, cooperative Eyes: extraocular movements intact, anicteric, conjunctiva pink Mouth: oral pharynx moist, no lesions Neck: supple no lymphadenopathy Cardiovascular: heart regular rate and rhythm, no murmur Lungs: clear to auscultation bilaterally Abdomen: soft, nontender, nondistended, no obvious ascites, no peritoneal signs, normal bowel sounds, no organomegaly Rectal: Omitted Extremities: no  clubbing, cyanosis, or lower extremity edema bilaterally Skin: no lesions on visible extremities Neuro: No focal deficits.  Cranial nerves intact  ASSESSMENT:  1.  3-week history of atypical right back and right side pain as described.  Unlikely primary GI disorder.  Most consistent with musculoskeletal etiology.  Occasionally feels like it is worse after meals.  Vague, nonspecific, and not necessarily. 2.  Health related anxiety 3.  GERD.  Takes OTC acid suppressing agents as needed 4.  General medical problems including rheumatoid arthritis history of pulmonary embolus on Coumadin   PLAN:  1. Abdominal ultrasound to evaluate the liver and gallbladder.  Present concerns of hers.  We will contact her when the results are available 2.  Offered upper endoscopy.  She wishes to wait 3.  Again encouraged to consider colon cancer screening strategies.  She understands.  Concerned about doing anything "during Covid". 4.  Continue general medical care with PCP and other specialists Total time of 45 minutes was spent preparing to see the patient, reviewing outside evaluations and tests.  Obtaining comprehensive history, performing comprehensive physical exam, counseling the patient regarding her above listed issues and answering a myriad of questions.  Ordering advanced radiology discussing diagnostic endoscopy.  Finally, documenting clinical information in the health record

## 2020-02-08 NOTE — Patient Instructions (Signed)
You have been scheduled for an abdominal ultrasound at [facility] ([place]) on 02/13/2020 at 8:00am. Please arrive 15 minutes prior to your appointment for registration. Make certain not to have anything to eat or drink after midnight prior to your appointment. Should you need to reschedule your appointment, please contact radiology at [phone]. This test typically takes about 30 minutes to perform.

## 2020-02-13 ENCOUNTER — Other Ambulatory Visit: Payer: Self-pay

## 2020-02-13 ENCOUNTER — Ambulatory Visit (INDEPENDENT_AMBULATORY_CARE_PROVIDER_SITE_OTHER): Payer: BC Managed Care – PPO | Admitting: General Practice

## 2020-02-13 ENCOUNTER — Ambulatory Visit
Admission: RE | Admit: 2020-02-13 | Discharge: 2020-02-13 | Disposition: A | Discharge status: Home / Self Care | Payer: BC Managed Care – PPO | Source: Ambulatory Visit | Attending: Internal Medicine | Admitting: Internal Medicine

## 2020-02-13 DIAGNOSIS — M546 Pain in thoracic spine: Secondary | ICD-10-CM

## 2020-02-13 DIAGNOSIS — R760 Raised antibody titer: Secondary | ICD-10-CM | POA: Diagnosis not present

## 2020-02-13 DIAGNOSIS — Z7901 Long term (current) use of anticoagulants: Secondary | ICD-10-CM

## 2020-02-13 DIAGNOSIS — R1011 Right upper quadrant pain: Secondary | ICD-10-CM

## 2020-02-13 LAB — POCT INR: INR: 1.8 — AB (ref 2.0–3.0)

## 2020-02-13 NOTE — Patient Instructions (Addendum)
Pre visit review using our clinic review tool, if applicable. No additional management support is needed unless otherwise documented below in the visit note.  Continue to take 2 tablets daily except 1 tablet on Sunday and Thursday.  Re-check in 4 to 6 weeks.    

## 2020-02-16 ENCOUNTER — Ambulatory Visit (INDEPENDENT_AMBULATORY_CARE_PROVIDER_SITE_OTHER): Payer: BC Managed Care – PPO | Admitting: Psychology

## 2020-02-16 DIAGNOSIS — F411 Generalized anxiety disorder: Secondary | ICD-10-CM

## 2020-03-01 ENCOUNTER — Ambulatory Visit (INDEPENDENT_AMBULATORY_CARE_PROVIDER_SITE_OTHER): Payer: BC Managed Care – PPO | Admitting: Psychology

## 2020-03-01 ENCOUNTER — Ambulatory Visit (INDEPENDENT_AMBULATORY_CARE_PROVIDER_SITE_OTHER): Payer: BC Managed Care – PPO | Admitting: General Practice

## 2020-03-01 ENCOUNTER — Other Ambulatory Visit: Payer: Self-pay

## 2020-03-01 DIAGNOSIS — R760 Raised antibody titer: Secondary | ICD-10-CM | POA: Diagnosis not present

## 2020-03-01 DIAGNOSIS — F411 Generalized anxiety disorder: Secondary | ICD-10-CM | POA: Diagnosis not present

## 2020-03-01 DIAGNOSIS — Z7901 Long term (current) use of anticoagulants: Secondary | ICD-10-CM | POA: Diagnosis not present

## 2020-03-01 LAB — POCT INR: INR: 1.7 — AB (ref 2.0–3.0)

## 2020-03-01 NOTE — Patient Instructions (Signed)
Pre visit review using our clinic review tool, if applicable. No additional management support is needed unless otherwise documented below in the visit note.  Continue to take 2 tablets daily except 1 tablet on Sunday and Thursday.  Re-check in 4 to 6 weeks.    

## 2020-03-01 NOTE — Progress Notes (Signed)
Medical screening examination/treatment/procedure(s) were performed by non-physician practitioner and as supervising physician I was immediately available for consultation/collaboration. I agree with above. Fred Hammes, MD   

## 2020-03-15 ENCOUNTER — Ambulatory Visit: Payer: BC Managed Care – PPO | Admitting: Psychology

## 2020-03-26 ENCOUNTER — Ambulatory Visit: Payer: BC Managed Care – PPO

## 2020-03-28 NOTE — Progress Notes (Signed)
Office Visit Note  Patient: [name]             Date of Birth: 01-Apr-1961           MRN: [id number]             PCP: Burnis Medin, MD Referring: Burnis Medin, MD Visit Date: 04/04/2020 Occupation: @GUAROCC @  Subjective:  Medication management.   History of Present Illness: [name] is a 59 y.o. female with history of rheumatoid arthritis, osteoarthritis and osteoporosis.  She states she has been having frequent urinary tract infections.  She has been seeing a urologist for evaluation.  She also had recent bone density and would like to discuss treatment options.  She was treated with Fosamax in the past and has been off Fosamax for 3-1/2 years.  She reports interruption in the methotrexate treatment due to frequent UTIs.  She denies any increased joint swelling.  Activities of Daily Living:  Patient reports morning stiffness for 5-10 minutes.   Patient Denies nocturnal pain.  Difficulty dressing/grooming: Denies Difficulty climbing stairs: Reports Difficulty getting out of chair: Denies Difficulty using hands for taps, buttons, cutlery, and/or writing: Reports  Review of Systems  Constitutional: Positive for fatigue.  HENT: Positive for mouth dryness. Negative for mouth sores and nose dryness.   Eyes: Positive for dryness. Negative for pain, itching and visual disturbance.  Respiratory: Negative for cough, hemoptysis, shortness of breath and difficulty breathing.   Cardiovascular: Negative for chest pain, palpitations and swelling in legs/feet.  Gastrointestinal: Negative for abdominal pain, blood in stool, constipation and diarrhea.  Endocrine: Positive for increased urination.  Genitourinary: Negative for painful urination.  Musculoskeletal: Positive for arthralgias, joint pain, muscle weakness, morning stiffness and muscle tenderness. Negative for joint swelling, myalgias and myalgias.  Skin: Negative for color change, rash and redness.    Allergic/Immunologic: Positive for susceptible to infections.  Neurological: Positive for dizziness. Negative for numbness, headaches, memory loss and weakness.  Hematological: Negative for swollen glands.  Psychiatric/Behavioral: Positive for sleep disturbance. Negative for confusion.    PMFS History:  Patient Active Problem List   Diagnosis Date Noted  . 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[place]) 01/06/2018  . Long term (current) use of anticoagulants 02/11/2017  . Sicca syndrome ([place]) 12/22/2016  . ANA positive 12/22/2016  . 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 Migraine 03/31/2016  . Anxiety 03/31/2016  . High risk medication use 03/31/2016  . Primary osteoarthritis of both feet 03/31/2016  . Primary osteoarthritis of both knees 03/31/2016  . Stiffness of left hand joint 11/27/2014  . Elevated IOP 06/09/2014  . Long term current use of systemic steroids 06/09/2014  . Medically complex patient 06/09/2014  . Current use of steroid medication 01/09/2014  . Xerosis of skin 12/07/2013  . Encounter for therapeutic drug monitoring 06/09/2013  . B12 deficiency 05/31/2013  . Iron deficiency 05/31/2013  . Visit for preventive health examination 05/25/2012  . Mammogram abnormal 04/12/2012  . 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 Vaginal atrophy 01/23/2012  . History of recurrent UTI (urinary tract infection) 01/23/2012  . Osteoporosis 04/29/2011  . Nonspecific abnormal results of liver function study 12/08/2010  . Chronic anticoagulation   . Anticoagulant long-term use 07/15/2010  . 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 Chronic cough 03/19/2009  . CHEST PAIN-UNSPECIFIED 08/26/2008  . PALPITATIONS, RECURRENT 07/24/2008  . Vitamin D deficiency 05/30/2008  . HYPERLIPIDEMIA 05/30/2008  . IRON DEFICIENCY 05/30/2008  . Adjustment disorder with anxiety 05/30/2008  .  Rheumatoid arthritis ([place]) 05/30/2008  . PULMONARY EMBOLISM, HX OF 05/30/2008    Past Medical  History:  Diagnosis Date  . Adjustment disorder with anxiety   . B12 deficiency due to diet    is a vegetarian  . Chest pain, unspecified   . Chronic anticoagulation   . Fatty liver    Per patient  . Headache(784.0)    migraines and head pain  . Hx of varicella    As Child  . Iron deficiency anemia, unspecified   . Otalgia, unspecified   . Other and unspecified hyperlipidemia   . Palpitations   . Personal history of venous thrombosis and embolism   . Recurrent UTI   . Rheumatoid arthritis(714.0)    deveshewar  . Unspecified vitamin D deficiency   . UTI (lower urinary tract infection) 11/28/2011   citrobacter       Family History  Problem Relation Age of Onset  . Dementia Father   . Stroke Father   . Hyperlipidemia Father   . Diabetes Father   . Parkinsonism Mother   . Lymphoma Other   . Colon cancer Neg Hx    Past Surgical History:  Procedure Laterality Date  . ORIF ULNAR FRACTURE    . TYMPANOSTOMY TUBE PLACEMENT  2009   Right   Social History   Social History Narrative   Regular Exercise-no   20 yrs in [place]   Son at [employer]   From [place].   Married HH  of 3    G1P1            Immunization History  Administered Date(s) Administered  . Influenza,inj,Quad PF,6+ Mos 04/19/2013, 06/27/2015, 03/05/2016, 02/11/2017, 03/15/2018, 02/23/2019, 04/02/2020  . PFIZER SARS-COV-2 Vaccination 08/06/2019, 08/25/2019, 01/12/2020  . PPD Test 07/21/2016  . Tdap 05/31/2013     Objective: Vital Signs: BP 106/72 (BP Location: Right Arm, Patient Position: Sitting, Cuff Size: Small)   Pulse 71   Ht 5' 3.5" (1.613 m)   Wt 162 lb 9.6 oz (73.8 kg)   BMI 28.35 kg/m    Physical Exam Vitals and nursing note reviewed.  Constitutional:      Appearance: She is well-developed.  HENT:     Head: Normocephalic and atraumatic.  Eyes:     Conjunctiva/sclera: Conjunctivae normal.  Cardiovascular:     Rate and Rhythm: Normal rate and regular rhythm.     Heart sounds: Normal heart  sounds.  Pulmonary:     Effort: Pulmonary effort is normal.     Breath sounds: Normal breath sounds.  Abdominal:     General: Bowel sounds are normal.     Palpations: Abdomen is soft.  Musculoskeletal:     Cervical back: Normal range of motion.  Lymphadenopathy:     Cervical: No cervical adenopathy.  Skin:    General: Skin is warm and dry.     Capillary Refill: Capillary refill takes less than 2 seconds.  Neurological:     Mental Status: She is alert and oriented to person, place, and time.  Psychiatric:        Behavior: Behavior normal.      Musculoskeletal Exam: C-spine was in good range of motion.  Shoulder joints, elbow joints, wrist joints, MCPs PIPs and DIPs with good range of motion with no synovitis.  Hip joints, knee joints, ankles, MTPs and PIPs with good range of motion with no synovitis.  CDAI Exam: CDAI Score: 0.2  Patient Global: 1 mm; Provider Global: 1 mm Swollen: 0 ; Tender: 0  Joint Exam 04/04/2020   No joint exam has been documented for this visit   There is currently no information documented on the homunculus. Go to the Rheumatology activity and complete the homunculus joint exam.  Investigation: No additional findings.  Imaging: No results found.  Recent Labs: Lab Results  Component Value Date   WBC 7.3 01/06/2020   HGB 14.3 01/06/2020   PLT 267 01/06/2020   NA 138 01/06/2020   K 4.3 01/06/2020   CL 104 01/06/2020   CO2 26 01/06/2020   GLUCOSE 88 01/06/2020   BUN 13 01/06/2020   CREATININE 0.89 01/06/2020   BILITOT 0.9 01/06/2020   ALKPHOS 83 09/13/2019   AST 19 01/06/2020   ALT 12 01/06/2020   PROT 7.0 01/06/2020   ALBUMIN 4.1 09/13/2019   CALCIUM 9.4 01/06/2020   GFRAA 81 01/06/2020    Speciality Comments: PLQ Eye Exam: 09/26/2019 WNL @ [facility] Follow up 6 months  Procedures:  No procedures performed Allergies: Boniva [ibandronate sodium], Ivp dye [iodinated diagnostic agents], Other, Risedronate sodium, and  Risedronate sodium [risedronate sodium]   Assessment / Plan:     Visit Diagnoses: Rheumatoid arthritis of multiple sites with negative rheumatoid factor (HCC) - RF negative, anti-CCP positive, ANA positive -patient had no synovitis on my examination today.  She states she interrupts methotrexate and Plaquenil frequently for recurrent UTIs.  She has not noticed any flares.  Although she recalls having a flare after interrupting treatment for COVID-19 vaccination.  I discussed reducing methotrexate dose to 6 tablets p.o. weekly but she is hesitant to reduce the dose.  High risk medication use - Plaquenil 200 mg by mouth daily, Methotrexate 8 tablets by mouth every week, and folic acid 2 mg by mouth daily.PLQ Eye Exam: 09/26/2019 WNL @ [facility]: CBC with Differential/Platelet, COMPLETE METABOLIC PANEL WITH GFR today and then every 3 months to monitor for drug toxicity.  Primary osteoarthritis of both knees-she is currently not having much discomfort.  Primary osteoarthritis of both feet-proper fitting shoes were discussed.  Sicca syndrome (HCC)-she has been using over-the-counter products which has been helpful.  Age related osteoporosis, unspecified pathological fracture presence - 02/02/20 T-score DualFemur Neck Left  02/02/2020 T score  -2.8, BMD 0.649 g/cm2. Off Fosamax for 31/2 yeras.  We had detailed discussion regarding different treatment options and their side effects.  She has taken bisphosphonates in the past.  She had a drug holiday.  I discussed the option of Prolia but she declined.  She is not interested in Reclast.  We will restart her on alendronate 70 mg p.o. weekly.  She has a history of reflux.  Have advised her to take the medication with full glass of water and stay upright for 30 minutes after each dose.  Other medical problems are listed as follows:  Raised intraocular pressure of both eyes  History of pulmonary embolism  History of hyperlipidemia  Fatty  liver  History of anemia  History of migraine  Frequent UTI-she is followed by urology.  Orders: Orders Placed This Encounter  Procedures  . CBC with Differential/Platelet  . COMPLETE METABOLIC PANEL WITH GFR   Meds ordered this encounter  Medications  . alendronate (FOSAMAX) 70 MG tablet    Sig: Take 1 tablet (70 mg total) by mouth once a week. Take with a full glass of water on an empty stomach.    Dispense:  4 tablet    Refill:  2      Follow-Up  Instructions: Return in about 5 months (around 09/02/2020) for Rheumatoid arthritis, Osteoarthritis.   Bo Merino, MD  Note - This record has been created using Editor, commissioning.  Chart creation errors have been sought, but may not always  have been located. Such creation errors do not reflect on  the standard of medical care.

## 2020-03-29 ENCOUNTER — Ambulatory Visit (INDEPENDENT_AMBULATORY_CARE_PROVIDER_SITE_OTHER): Payer: BC Managed Care – PPO | Admitting: Psychology

## 2020-03-29 DIAGNOSIS — F411 Generalized anxiety disorder: Secondary | ICD-10-CM | POA: Diagnosis not present

## 2020-04-02 ENCOUNTER — Other Ambulatory Visit: Payer: Self-pay

## 2020-04-02 ENCOUNTER — Ambulatory Visit (INDEPENDENT_AMBULATORY_CARE_PROVIDER_SITE_OTHER): Payer: BC Managed Care – PPO | Admitting: General Practice

## 2020-04-02 ENCOUNTER — Ambulatory Visit: Payer: BC Managed Care – PPO | Admitting: *Deleted

## 2020-04-02 ENCOUNTER — Ambulatory Visit: Payer: BC Managed Care – PPO

## 2020-04-02 DIAGNOSIS — R760 Raised antibody titer: Secondary | ICD-10-CM

## 2020-04-02 DIAGNOSIS — Z7901 Long term (current) use of anticoagulants: Secondary | ICD-10-CM

## 2020-04-02 DIAGNOSIS — Z23 Encounter for immunization: Secondary | ICD-10-CM | POA: Diagnosis not present

## 2020-04-02 LAB — POCT INR: INR: 1.7 — AB (ref 2.0–3.0)

## 2020-04-02 NOTE — Patient Instructions (Addendum)
Pre visit review using our clinic review tool, if applicable. No additional management support is needed unless otherwise documented below in the visit note.  Continue to take 2 tablets daily except 1 tablet on Sunday and Thursday.  Re-check in 4 to 6 weeks.

## 2020-04-03 NOTE — Progress Notes (Unsigned)
Flu vaccine given during coumadin clinic visit

## 2020-04-04 ENCOUNTER — Other Ambulatory Visit: Payer: Self-pay

## 2020-04-04 ENCOUNTER — Encounter: Payer: Self-pay | Admitting: Rheumatology

## 2020-04-04 ENCOUNTER — Ambulatory Visit: Payer: BC Managed Care – PPO | Admitting: Rheumatology

## 2020-04-04 VITALS — BP 106/72 | HR 71 | Ht 63.5 in | Wt 162.6 lb

## 2020-04-04 DIAGNOSIS — Z8639 Personal history of other endocrine, nutritional and metabolic disease: Secondary | ICD-10-CM

## 2020-04-04 DIAGNOSIS — M19071 Primary osteoarthritis, right ankle and foot: Secondary | ICD-10-CM | POA: Diagnosis not present

## 2020-04-04 DIAGNOSIS — M170 Bilateral primary osteoarthritis of knee: Secondary | ICD-10-CM | POA: Diagnosis not present

## 2020-04-04 DIAGNOSIS — M3500 Sicca syndrome, unspecified: Secondary | ICD-10-CM

## 2020-04-04 DIAGNOSIS — K760 Fatty (change of) liver, not elsewhere classified: Secondary | ICD-10-CM

## 2020-04-04 DIAGNOSIS — N390 Urinary tract infection, site not specified: Secondary | ICD-10-CM

## 2020-04-04 DIAGNOSIS — Z8669 Personal history of other diseases of the nervous system and sense organs: Secondary | ICD-10-CM

## 2020-04-04 DIAGNOSIS — Z86711 Personal history of pulmonary embolism: Secondary | ICD-10-CM

## 2020-04-04 DIAGNOSIS — Z862 Personal history of diseases of the blood and blood-forming organs and certain disorders involving the immune mechanism: Secondary | ICD-10-CM

## 2020-04-04 DIAGNOSIS — Z79899 Other long term (current) drug therapy: Secondary | ICD-10-CM | POA: Diagnosis not present

## 2020-04-04 DIAGNOSIS — H40053 Ocular hypertension, bilateral: Secondary | ICD-10-CM

## 2020-04-04 DIAGNOSIS — M19072 Primary osteoarthritis, left ankle and foot: Secondary | ICD-10-CM

## 2020-04-04 DIAGNOSIS — M0609 Rheumatoid arthritis without rheumatoid factor, multiple sites: Secondary | ICD-10-CM

## 2020-04-04 DIAGNOSIS — M810 Age-related osteoporosis without current pathological fracture: Secondary | ICD-10-CM

## 2020-04-04 MED ORDER — ALENDRONATE SODIUM 70 MG PO TABS: 70.0000 mg | ORAL_TABLET | ORAL | 2 refills | Status: DC

## 2020-04-04 NOTE — Patient Instructions (Addendum)
Standing Labs We placed an order today for your standing lab work.   Please have your standing labs drawn in February and every 3 months  If possible, please have your labs drawn 2 weeks prior to your appointment so that the provider can discuss your results at your appointment.  We have open lab daily Monday through Thursday from 8:30-12:30 PM and 1:30-4:30 PM and Friday from 8:30-12:30 PM and 1:30-4:00 PM at the office of Dr. Bo Merino, [facility].   Please be advised, patients with office appointments requiring lab work will take precedents over walk-in lab work.  If possible, please come for your lab work on Monday and Friday afternoons, as you may experience shorter wait times. The office is located at [address] [place], [place], [place] [address] No appointment is necessary.   Labs are drawn by [facility]. Please bring your co-pay at the time of your lab draw.  You may receive a bill from [facility] for your lab work.  If you wish to have your labs drawn at another location, please call the office 24 hours in advance to send orders.  If you have any questions regarding directions or hours of operation,  please call [phone].   As a reminder, please drink plenty of water prior to coming for your lab work. Thanks!   Alendronate tablets What is this medicine? ALENDRONATE (a LEN droe nate) slows calcium loss from bones. It helps to make normal healthy bone and to slow bone loss in people with Paget's disease and osteoporosis. It may be used in others at risk for bone loss. This medicine may be used for other purposes; ask your health care provider or pharmacist if you have questions. COMMON BRAND NAME(S): Fosamax What should I tell my health care provider before I take this medicine? They need to know if you have any of these conditions:  dental disease  esophagus, stomach, or intestine problems, like acid reflux or GERD  kidney disease  low blood  calcium  low vitamin D  problems sitting or standing 30 minutes  trouble swallowing  an unusual or allergic reaction to alendronate, other medicines, foods, dyes, or preservatives  pregnant or trying to get pregnant  breast-feeding How should I use this medicine? You must take this medicine exactly as directed or you will lower the amount of the medicine you absorb into your body or you may cause yourself harm. Take this medicine by mouth first thing in the morning, after you are up for the day. Do not eat or drink anything before you take your medicine. Swallow the tablet with a full glass (6 to 8 fluid ounces) of plain water. Do not take this medicine with any other drink. Do not chew or crush the tablet. After taking this medicine, do not eat breakfast, drink, or take any medicines or vitamins for at least 30 minutes. Sit or stand up for at least 30 minutes after you take this medicine; do not lie down. Do not take your medicine more often than directed. Talk to your pediatrician regarding the use of this medicine in children. Special care may be needed. Overdosage: If you think you have taken too much of this medicine contact a poison control center or emergency room at once. NOTE: This medicine is only for you. Do not share this medicine with others. What if I miss a dose? If you miss a dose, do not take it later in the day. Continue your normal schedule starting the  next morning. Do not take double or extra doses. What may interact with this medicine?  aluminum hydroxide  antacids  aspirin  calcium supplements  drugs for inflammation like ibuprofen, naproxen, and others  iron supplements  magnesium supplements  vitamins with minerals This list may not describe all possible interactions. Give your health care provider a list of all the medicines, herbs, non-prescription drugs, or dietary supplements you use. Also tell them if you smoke, drink alcohol, or use illegal drugs.  Some items may interact with your medicine. What should I watch for while using this medicine? Visit your doctor or health care professional for regular checks ups. It may be some time before you see benefit from this medicine. Do not stop taking your medicine except on your doctor's advice. Your doctor or health care professional may order blood tests and other tests to see how you are doing. You should make sure you get enough calcium and vitamin D while you are taking this medicine, unless your doctor tells you not to. Discuss the foods you eat and the vitamins you take with your health care professional. Some people who take this medicine have severe bone, joint, and/or muscle pain. This medicine may also increase your risk for a broken thigh bone. Tell your doctor right away if you have pain in your upper leg or groin. Tell your doctor if you have any pain that does not go away or that gets worse. This medicine can make you more sensitive to the sun. If you get a rash while taking this medicine, sunlight may cause the rash to get worse. Keep out of the sun. If you cannot avoid being in the sun, wear protective clothing and use sunscreen. Do not use sun lamps or tanning beds/booths. What side effects may I notice from receiving this medicine? Side effects that you should report to your doctor or health care professional as soon as possible:  allergic reactions like skin rash, itching or hives, swelling of the face, lips, or tongue  black or tarry stools  bone, muscle or joint pain  changes in vision  chest pain  heartburn or stomach pain  jaw pain, especially after dental work  pain or trouble when swallowing  redness, blistering, peeling or loosening of the skin, including inside the mouth Side effects that usually do not require medical attention (report to your doctor or health care professional if they continue or are bothersome):  changes in taste  diarrhea or  constipation  eye pain or itching  headache  nausea or vomiting  stomach gas or fullness This list may not describe all possible side effects. Call your doctor for medical advice about side effects. You may report side effects to FDA at 1-800-FDA-1088. Where should I keep my medicine? Keep out of the reach of children. Store at room temperature of 15 and 30 degrees C (59 and 86 degrees F). Throw away any unused medicine after the expiration date. NOTE: This sheet is a summary. It may not cover all possible information. If you have questions about this medicine, talk to your doctor, pharmacist, or health care provider.  2020 Elsevier/Gold Standard (2010-11-01 08:56:09)

## 2020-04-05 LAB — CBC WITH DIFFERENTIAL/PLATELET
Absolute Monocytes: 697 cells/uL (ref 200–950)
Basophils Absolute: 57 cells/uL (ref 0–200)
Basophils Relative: 0.7 %
Eosinophils Absolute: 122 cells/uL (ref 15–500)
Eosinophils Relative: 1.5 %
HCT: 44.4 % (ref 35.0–45.0)
Hemoglobin: 14.5 g/dL (ref 11.7–15.5)
Lymphs Abs: 2592 cells/uL (ref 850–3900)
MCH: 27.4 pg (ref 27.0–33.0)
MCHC: 32.7 g/dL (ref 32.0–36.0)
MCV: 83.8 fL (ref 80.0–100.0)
MPV: 11.7 fL (ref 7.5–12.5)
Monocytes Relative: 8.6 %
Neutro Abs: 4633 cells/uL (ref 1500–7800)
Neutrophils Relative %: 57.2 %
Platelets: 275 10*3/uL (ref 140–400)
RBC: 5.3 10*6/uL — ABNORMAL HIGH (ref 3.80–5.10)
RDW: 12.6 % (ref 11.0–15.0)
Total Lymphocyte: 32 %
WBC: 8.1 10*3/uL (ref 3.8–10.8)

## 2020-04-05 LAB — COMPLETE METABOLIC PANEL WITH GFR
AG Ratio: 1.6 (calc) (ref 1.0–2.5)
ALT: 12 U/L (ref 6–29)
AST: 19 U/L (ref 10–35)
Albumin: 4.3 g/dL (ref 3.6–5.1)
Alkaline phosphatase (APISO): 81 U/L (ref 37–153)
BUN: 13 mg/dL (ref 7–25)
CO2: 28 mmol/L (ref 20–32)
Calcium: 9.6 mg/dL (ref 8.6–10.4)
Chloride: 104 mmol/L (ref 98–110)
Creat: 0.76 mg/dL (ref 0.50–0.99)
GFR, Est African American: 98 mL/min/{1.73_m2} (ref >=60)
GFR, Est Non African American: 85 mL/min/{1.73_m2} (ref >=60)
Globulin: 2.7 g/dL (calc) (ref 1.9–3.7)
Glucose, Bld: 102 mg/dL (ref 65–139)
Potassium: 4.5 mmol/L (ref 3.5–5.3)
Sodium: 140 mmol/L (ref 135–146)
Total Bilirubin: 0.6 mg/dL (ref 0.2–1.2)
Total Protein: 7 g/dL (ref 6.1–8.1)

## 2020-04-05 NOTE — Progress Notes (Signed)
Labs are within normal limits.

## 2020-04-06 ENCOUNTER — Other Ambulatory Visit: Payer: Self-pay | Admitting: Internal Medicine

## 2020-04-10 ENCOUNTER — Other Ambulatory Visit: Payer: Self-pay | Admitting: Rheumatology

## 2020-04-10 ENCOUNTER — Other Ambulatory Visit: Payer: Self-pay | Admitting: Internal Medicine

## 2020-04-10 DIAGNOSIS — M0609 Rheumatoid arthritis without rheumatoid factor, multiple sites: Secondary | ICD-10-CM

## 2020-04-10 DIAGNOSIS — Z7901 Long term (current) use of anticoagulants: Secondary | ICD-10-CM

## 2020-04-10 NOTE — Telephone Encounter (Signed)
Last Visit: 04/04/2020 Next Visit: 09/13/2019 Labs: 04/04/2020 Labs are within normal limits. 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 Estanislado Pandy

## 2020-04-18 ENCOUNTER — Other Ambulatory Visit: Payer: Self-pay | Admitting: Rheumatology

## 2020-04-18 NOTE — Telephone Encounter (Signed)
Last Visit: 04/04/2020 Next Visit: 09/13/2019 Labs: 04/04/2020 Labs are within normal limits.  Current Dose per office note on 04/04/2020: Methotrexate 8 tablets by mouth every week Dx: Rheumatoid arthritis of multiple sites with negative rheumatoid factor   Okay to refill per Dr. Estanislado Pandy

## 2020-04-23 ENCOUNTER — Ambulatory Visit: Payer: BC Managed Care – PPO

## 2020-04-26 ENCOUNTER — Ambulatory Visit (INDEPENDENT_AMBULATORY_CARE_PROVIDER_SITE_OTHER): Payer: BC Managed Care – PPO | Admitting: Psychology

## 2020-04-26 DIAGNOSIS — F411 Generalized anxiety disorder: Secondary | ICD-10-CM | POA: Diagnosis not present

## 2020-05-07 ENCOUNTER — Other Ambulatory Visit: Payer: Self-pay

## 2020-05-07 ENCOUNTER — Ambulatory Visit: Payer: BC Managed Care – PPO | Admitting: General Practice

## 2020-05-07 DIAGNOSIS — R760 Raised antibody titer: Secondary | ICD-10-CM

## 2020-05-07 DIAGNOSIS — Z7901 Long term (current) use of anticoagulants: Secondary | ICD-10-CM

## 2020-05-07 LAB — POCT INR: INR: 1.7 — AB (ref 2.0–3.0)

## 2020-05-07 NOTE — Patient Instructions (Signed)
Pre visit review using our clinic review tool, if applicable. No additional management support is needed unless otherwise documented below in the visit note.  Continue to take 2 tablets daily except 1 tablet on Sunday and Thursday.  Re-check in 4 to 6 weeks.

## 2020-05-09 NOTE — Telephone Encounter (Signed)
Yes I think it is a good idea to get the Shingrix vaccine.  As immunosuppression can give you risk of getting a bad case of shingles. It is not a live vaccine with its potential side effects. You could get fever chills sore arm a day or 2 after the vaccine but this goes away (very similar to Covid vaccine)

## 2020-05-10 ENCOUNTER — Ambulatory Visit (INDEPENDENT_AMBULATORY_CARE_PROVIDER_SITE_OTHER): Payer: BC Managed Care – PPO | Admitting: Psychology

## 2020-05-10 DIAGNOSIS — F411 Generalized anxiety disorder: Secondary | ICD-10-CM

## 2020-05-24 ENCOUNTER — Ambulatory Visit (INDEPENDENT_AMBULATORY_CARE_PROVIDER_SITE_OTHER): Payer: BC Managed Care – PPO | Admitting: Psychology

## 2020-05-24 DIAGNOSIS — F411 Generalized anxiety disorder: Secondary | ICD-10-CM

## 2020-05-25 ENCOUNTER — Ambulatory Visit: Payer: BC Managed Care – PPO

## 2020-06-07 ENCOUNTER — Ambulatory Visit (INDEPENDENT_AMBULATORY_CARE_PROVIDER_SITE_OTHER): Payer: BC Managed Care – PPO | Admitting: Psychology

## 2020-06-07 DIAGNOSIS — F411 Generalized anxiety disorder: Secondary | ICD-10-CM

## 2020-06-18 ENCOUNTER — Other Ambulatory Visit: Payer: Self-pay

## 2020-06-18 ENCOUNTER — Ambulatory Visit (INDEPENDENT_AMBULATORY_CARE_PROVIDER_SITE_OTHER): Payer: BC Managed Care – PPO | Admitting: General Practice

## 2020-06-18 DIAGNOSIS — Z7901 Long term (current) use of anticoagulants: Secondary | ICD-10-CM

## 2020-06-18 DIAGNOSIS — R760 Raised antibody titer: Secondary | ICD-10-CM

## 2020-06-18 LAB — POCT INR: INR: 1.5 — AB (ref 2.0–3.0)

## 2020-06-18 NOTE — Patient Instructions (Signed)
Pre visit review using our clinic review tool, if applicable. No additional management support is needed unless otherwise documented below in the visit note.  Continue to take 2 tablets daily except 1 tablet on Sunday and Thursday.  Re-check in 4 to 6 weeks.    

## 2020-06-21 ENCOUNTER — Ambulatory Visit (INDEPENDENT_AMBULATORY_CARE_PROVIDER_SITE_OTHER): Payer: BC Managed Care – PPO | Admitting: Psychology

## 2020-06-21 DIAGNOSIS — F411 Generalized anxiety disorder: Secondary | ICD-10-CM | POA: Diagnosis not present

## 2020-07-05 ENCOUNTER — Ambulatory Visit (INDEPENDENT_AMBULATORY_CARE_PROVIDER_SITE_OTHER): Payer: BC Managed Care – PPO | Admitting: Psychology

## 2020-07-05 DIAGNOSIS — F411 Generalized anxiety disorder: Secondary | ICD-10-CM | POA: Diagnosis not present

## 2020-07-09 ENCOUNTER — Other Ambulatory Visit: Payer: Self-pay | Admitting: Rheumatology

## 2020-07-09 DIAGNOSIS — M0609 Rheumatoid arthritis without rheumatoid factor, multiple sites: Secondary | ICD-10-CM

## 2020-07-09 NOTE — Telephone Encounter (Signed)
Last Visit: 04/04/2020 Next Visit: 09/13/2019 Labs:04/04/2020 Labs are within normal limits. PLQ Eye Exam: 09/26/2019 WNL   Current Dose per office note on 04/04/2020:Plaquenil 200 mg by mouth daily, folic acid 2 mg by mouth daily Dx: Rheumatoid arthritis of multiple sites with negative rheumatoid factor   Last Fill: 29/47/6546 PLQ and Folic Acid 09/19/5463  Okay to refill per Dr. Estanislado Pandy

## 2020-07-19 ENCOUNTER — Ambulatory Visit (INDEPENDENT_AMBULATORY_CARE_PROVIDER_SITE_OTHER): Payer: BC Managed Care – PPO | Admitting: Psychology

## 2020-07-19 DIAGNOSIS — F411 Generalized anxiety disorder: Secondary | ICD-10-CM | POA: Diagnosis not present

## 2020-07-26 ENCOUNTER — Other Ambulatory Visit: Payer: Self-pay

## 2020-07-26 DIAGNOSIS — Z79899 Other long term (current) drug therapy: Secondary | ICD-10-CM

## 2020-07-27 LAB — COMPLETE METABOLIC PANEL WITH GFR
AG Ratio: 1.4 (calc) (ref 1.0–2.5)
ALT: 12 U/L (ref 6–29)
AST: 16 U/L (ref 10–35)
Albumin: 4.2 g/dL (ref 3.6–5.1)
Alkaline phosphatase (APISO): 71 U/L (ref 37–153)
BUN: 10 mg/dL (ref 7–25)
CO2: 30 mmol/L (ref 20–32)
Calcium: 9.5 mg/dL (ref 8.6–10.4)
Chloride: 104 mmol/L (ref 98–110)
Creat: 0.77 mg/dL (ref 0.50–0.99)
GFR, Est African American: 96 mL/min/{1.73_m2} (ref >=60)
GFR, Est Non African American: 83 mL/min/{1.73_m2} (ref >=60)
Globulin: 2.9 g/dL (calc) (ref 1.9–3.7)
Glucose, Bld: 101 mg/dL — ABNORMAL HIGH (ref 65–99)
Potassium: 4.9 mmol/L (ref 3.5–5.3)
Sodium: 140 mmol/L (ref 135–146)
Total Bilirubin: 0.7 mg/dL (ref 0.2–1.2)
Total Protein: 7.1 g/dL (ref 6.1–8.1)

## 2020-07-27 LAB — CBC WITH DIFFERENTIAL/PLATELET
Absolute Monocytes: 622 cells/uL (ref 200–950)
Basophils Absolute: 37 cells/uL (ref 0–200)
Basophils Relative: 0.5 %
Eosinophils Absolute: 89 cells/uL (ref 15–500)
Eosinophils Relative: 1.2 %
HCT: 45.2 % — ABNORMAL HIGH (ref 35.0–45.0)
Hemoglobin: 15 g/dL (ref 11.7–15.5)
Lymphs Abs: 2597 cells/uL (ref 850–3900)
MCH: 27.9 pg (ref 27.0–33.0)
MCHC: 33.2 g/dL (ref 32.0–36.0)
MCV: 84.2 fL (ref 80.0–100.0)
MPV: 12.2 fL (ref 7.5–12.5)
Monocytes Relative: 8.4 %
Neutro Abs: 4055 cells/uL (ref 1500–7800)
Neutrophils Relative %: 54.8 %
Platelets: 250 10*3/uL (ref 140–400)
RBC: 5.37 10*6/uL — ABNORMAL HIGH (ref 3.80–5.10)
RDW: 13 % (ref 11.0–15.0)
Total Lymphocyte: 35.1 %
WBC: 7.4 10*3/uL (ref 3.8–10.8)

## 2020-07-27 NOTE — Progress Notes (Signed)
CBC and CMP are stable.

## 2020-07-30 ENCOUNTER — Other Ambulatory Visit: Payer: Self-pay

## 2020-07-30 ENCOUNTER — Ambulatory Visit (INDEPENDENT_AMBULATORY_CARE_PROVIDER_SITE_OTHER): Payer: BC Managed Care – PPO | Admitting: General Practice

## 2020-07-30 DIAGNOSIS — Z7901 Long term (current) use of anticoagulants: Secondary | ICD-10-CM

## 2020-07-30 DIAGNOSIS — R760 Raised antibody titer: Secondary | ICD-10-CM

## 2020-07-30 LAB — POCT INR: INR: 1.6 — AB (ref 2.0–3.0)

## 2020-07-30 NOTE — Patient Instructions (Addendum)
Pre visit review using our clinic review tool, if applicable. No additional management support is needed unless otherwise documented below in the visit note.  Continue to take 2 tablets daily except 1 tablet on Sunday and Thursday.  Re-check in 4 to 6 weeks.    4-16 - Last dose of coumadin until after procedure  4-22 thru 4-24 - Take 3 tablets of coumadin  4-25 - Take 2 tablets of coumadin  4-26 - Check INR

## 2020-08-02 ENCOUNTER — Ambulatory Visit (INDEPENDENT_AMBULATORY_CARE_PROVIDER_SITE_OTHER): Payer: BC Managed Care – PPO | Admitting: Psychology

## 2020-08-02 DIAGNOSIS — F411 Generalized anxiety disorder: Secondary | ICD-10-CM

## 2020-08-07 ENCOUNTER — Other Ambulatory Visit: Payer: Self-pay | Admitting: Rheumatology

## 2020-08-07 ENCOUNTER — Other Ambulatory Visit: Payer: Self-pay

## 2020-08-07 MED ORDER — ALENDRONATE SODIUM 70 MG PO TABS
70.0000 mg | ORAL_TABLET | ORAL | 2 refills | Status: DC
Start: 2020-08-07 — End: 2020-12-31

## 2020-08-07 NOTE — Telephone Encounter (Signed)
Next Visit: 09/12/2020  Last Visit: 04/04/2020  Last Fill: 04/04/2020  DX: Age related osteoporosis, unspecified pathological fracture presence   Current Dose per office note 04/04/2020, We will restart her on alendronate 70 mg p.o. weekly  Labs: 07/26/2020, CBC and CMP are stable.  Okay to refill Alendronate?

## 2020-08-07 NOTE — Telephone Encounter (Signed)
Patient requested prescription refill of Alendronate 70 mg (3 month supply) to be sent to [facility] at [address]

## 2020-08-13 ENCOUNTER — Encounter: Payer: Self-pay | Admitting: Rheumatology

## 2020-08-16 ENCOUNTER — Ambulatory Visit: Payer: BC Managed Care – PPO | Admitting: Psychology

## 2020-08-21 ENCOUNTER — Encounter: Payer: Self-pay | Admitting: Internal Medicine

## 2020-08-21 ENCOUNTER — Ambulatory Visit (INDEPENDENT_AMBULATORY_CARE_PROVIDER_SITE_OTHER): Payer: BC Managed Care – PPO | Admitting: Internal Medicine

## 2020-08-21 ENCOUNTER — Other Ambulatory Visit: Payer: Self-pay

## 2020-08-21 ENCOUNTER — Other Ambulatory Visit: Payer: Self-pay | Admitting: Internal Medicine

## 2020-08-21 VITALS — BP 110/70 | HR 72 | Temp 97.7°F | Ht 63.5 in | Wt 161.6 lb

## 2020-08-21 DIAGNOSIS — Z87440 Personal history of urinary (tract) infections: Secondary | ICD-10-CM | POA: Diagnosis not present

## 2020-08-21 DIAGNOSIS — R3989 Other symptoms and signs involving the genitourinary system: Secondary | ICD-10-CM | POA: Diagnosis not present

## 2020-08-21 DIAGNOSIS — R300 Dysuria: Secondary | ICD-10-CM

## 2020-08-21 DIAGNOSIS — E611 Iron deficiency: Secondary | ICD-10-CM

## 2020-08-21 DIAGNOSIS — Z7901 Long term (current) use of anticoagulants: Secondary | ICD-10-CM | POA: Diagnosis not present

## 2020-08-21 DIAGNOSIS — E538 Deficiency of other specified B group vitamins: Secondary | ICD-10-CM

## 2020-08-21 DIAGNOSIS — Z0000 Encounter for general adult medical examination without abnormal findings: Secondary | ICD-10-CM

## 2020-08-21 DIAGNOSIS — R760 Raised antibody titer: Secondary | ICD-10-CM

## 2020-08-21 DIAGNOSIS — Z789 Other specified health status: Secondary | ICD-10-CM

## 2020-08-21 DIAGNOSIS — Z79899 Other long term (current) drug therapy: Secondary | ICD-10-CM

## 2020-08-21 DIAGNOSIS — M0609 Rheumatoid arthritis without rheumatoid factor, multiple sites: Secondary | ICD-10-CM

## 2020-08-21 LAB — POC URINALSYSI DIPSTICK (AUTOMATED)
Bilirubin, UA: NEGATIVE
Glucose, UA: NEGATIVE
Ketones, UA: NEGATIVE
Nitrite, UA: NEGATIVE
Protein, UA: NEGATIVE
Spec Grav, UA: 1.01 (ref 1.010–1.025)
Urobilinogen, UA: 0.2 E.U./dL
pH, UA: 6 (ref 5.0–8.0)

## 2020-08-21 MED ORDER — NITROFURANTOIN MONOHYD MACRO 100 MG PO CAPS: 100.0000 mg | ORAL_CAPSULE | Freq: Two times a day (BID) | ORAL | 0 refills | Status: AC

## 2020-08-21 NOTE — Progress Notes (Signed)
Future orders for upcoming CPX

## 2020-08-21 NOTE — Progress Notes (Signed)
Chief Complaint  Patient presents with  . Urinary Tract Infection    HPI: [name] 60 y.o. come in for symptoms that might be a UTI.  Has some bladder area pain without fever. Onset  3-4  Days    Dryness  Still with    Last one was  September Dr. Zigmund Daniel she took a couple pills of leftover Cipro but then treated with Macrobid. Hx recurrent uti  But non in system since las t year klebsiella  We will have a yearly check in May will need labs at that time. She is seen GYN urology suggested vaginal estrogen pills for the dryness and recurrent UTI.  She is apprehensive about this because of her history of blood clots but she is on preventive Coumadin.  Has temporarily stop the methotrexate planning to get the Covid booster.  Her son is traveling to [place] for a i visit for graduate school. ROS: See pertinent positives and negatives per HPI.  Past Medical History:  Diagnosis Date  . Adjustment disorder with anxiety   . B12 deficiency due to diet    is a vegetarian  . Chest pain, unspecified   . Chronic anticoagulation   . Fatty liver    Per patient  . Headache(784.0)    migraines and head pain  . Hx of varicella    As Child  . Iron deficiency anemia, unspecified   . Otalgia, unspecified   . Other and unspecified hyperlipidemia   . Palpitations   . Personal history of venous thrombosis and embolism   . Recurrent UTI   . Rheumatoid arthritis(714.0)    deveshewar  . Unspecified vitamin D deficiency   . UTI (lower urinary tract infection) 11/28/2011   citrobacter       Family History  Problem Relation Age of Onset  . Dementia Father   . Stroke Father   . Hyperlipidemia Father   . Diabetes Father   . Parkinsonism Mother   . Lymphoma Other   . Colon cancer Neg Hx     Social History   Socioeconomic History  . Marital status: Married    Spouse name: Not on file  . Number of children: 1  . Years of education: Not on file  . Highest education level: Not on  file  Occupational History  . Occupation: home maker  Tobacco Use  . Smoking status: Never Smoker  . Smokeless tobacco: Never Used  Vaping Use  . Vaping Use: Never used  Substance and Sexual Activity  . Alcohol use: No  . Drug use: Never  . Sexual activity: Not on file  Other Topics Concern  . Not on file  Social History Narrative   Regular Exercise-no   20 yrs in [place]   Son at [employer]   From [place].   Married [place]  of 3    G1P1            Social Determinants of Health   Financial Resource Strain: Not on file  Food Insecurity: Not on file  Transportation Needs: Not on file  Physical Activity: Not on file  Stress: Not on file  Social Connections: Not on file    Outpatient Medications Prior to Visit  Medication Sig Dispense Refill  . alendronate (FOSAMAX) 70 MG tablet Take 1 tablet (70 mg total) by mouth once a week. Take with a full glass of water on an empty stomach. 4 tablet 2  . ALPRAZolam (XANAX) 0.25 MG tablet Take 1 tablet (0.25  mg total) by mouth 2 (two) times daily as needed for anxiety. 30 tablet 0  . Calcium Carb-Cholecalciferol 600-800 MG-UNIT TABS Take by mouth.    . Cholecalciferol (VITAMIN D3) 2000 UNITS TABS Take 1 tablet by mouth daily.    . cycloSPORINE (RESTASIS) 0.05 % ophthalmic emulsion Place 1 drop into both eyes daily.    . Eflornithine HCl 13.9 % cream Apply topically daily.     . folic acid (FOLVITE) 1 MG tablet TAKE 2 TABLETS(2 MG) BY MOUTH DAILY 180 tablet 3  . hydroxychloroquine (PLAQUENIL) 200 MG tablet TAKE 1 TABLET(200 MG) BY MOUTH DAILY 90 tablet 0  . methotrexate (RHEUMATREX) 2.5 MG tablet TAKE 8 TABLETS BY MOUTH ONCE A WEEK 96 tablet 0  . mineral/vitamin supplement (MULTIGEN) 70 MG TABS tablet TAKE 1 TABLET BY MOUTH DAILY 90 tablet 3  . SUMAtriptan (IMITREX) 50 MG tablet TAKE ONE TABLET BY MOUTH AS NEEDED. MAY REPEAT IN 2 TO 4 HOURS IF NEEDED. 9 tablet 5  . warfarin (COUMADIN) 2.5 MG tablet TAKE 2 TABLETS BY MOUTH DAILY, EXCEPT ON MONDAY AND  THURSDAY TAKE 1 TABLET. OR AS DIRECTED BY ANTICOAGULATION CLINIC 180 tablet 1   No facility-administered medications prior to visit.     EXAM:  BP 110/70 (BP Location: Right Arm, Patient Position: Sitting, Cuff Size: Normal)   Pulse 72   Temp 97.7 F (36.5 C) (Oral)   Ht 5' 3.5" (1.613 m)   Wt 161 lb 9.6 oz (73.3 kg)   SpO2 98%   BMI 28.18 kg/m   Body mass index is 28.18 kg/m.  GENERAL: vitals reviewed and listed above, alert, oriented, appears well hydrated and in no acute distress HEENT: atraumatic, conjunctiva  clear, no obvious abnormalities on inspection of external nose and ears OP : Masked NECK: no obvious masses on inspection palpation  MS: moves all extremities without noticeable focal  abnormality PSYCH: pleasant and cooperative, no obvious depression or anxiety Lab Results  Component Value Date   WBC 7.4 07/26/2020   HGB 15.0 07/26/2020   HCT 45.2 (H) 07/26/2020   PLT 250 07/26/2020   GLUCOSE 101 (H) 07/26/2020   CHOL 194 09/13/2019   TRIG 139.0 09/13/2019   HDL 66.20 09/13/2019   LDLDIRECT 97.3 05/25/2013   LDLCALC 100 (H) 09/13/2019   ALT 12 07/26/2020   AST 16 07/26/2020   NA 140 07/26/2020   K 4.9 07/26/2020   CL 104 07/26/2020   CREATININE 0.77 07/26/2020   BUN 10 07/26/2020   CO2 30 07/26/2020   TSH 3.21 09/13/2019   INR 1.6 (A) 07/30/2020   HGBA1C 6.0 07/20/2018   BP Readings from Last 3 Encounters:  08/21/20 110/70  04/04/20 106/72  02/08/20 106/60   Urinalysis    Component Value Date/Time   COLORURINE yellow 04/09/2010 0929   APPEARANCEUR Hazy 04/09/2010 0929   LABSPEC 1.025 04/09/2010 0929   PHURINE 7.0 04/09/2010 0929   HGBUR trace-lysed 04/09/2010 0929   BILIRUBINUR negative 08/21/2020 1613   KETONESUR negative 11/28/2011 1138   PROTEINUR Negative 08/21/2020 1613   UROBILINOGEN 0.2 08/21/2020 1613   UROBILINOGEN 0.2 04/09/2010 0929   NITRITE negative 08/21/2020 1613   NITRITE negative 04/09/2010 0929   LEUKOCYTESUR Small  (1+) (A) 08/21/2020 1613     ASSESSMENT AND PLAN:  Discussed the following assessment and plan:  Dysuria - Plan: nitrofurantoin, macrocrystal-monohydrate, (MACROBID) 100 MG capsule  History of recurrent UTIs - Plan: POCT Urinalysis Dipstick (Automated), Urine Culture, Urine Culture, nitrofurantoin, macrocrystal-monohydrate, (MACROBID) 100 MG capsule  Suspected UTI  Long term (current) use of anticoagulants  Anticardiolipin antibody positive  Medication management Last  rx  Macrobid.  Suspected UTI with history of same probably triggered by estrogen deficiency. Suggested to go ahead with it estrogen tablets dissolvable and if concerned can increase her INR goal to 2-2.4 while on the bigger regimen but she should not have increased risk of clotting on the anticoagulant with a topical estrogen. We will place orders for labs before CPX to include iron panel D and B12.  Notify her when we get urine culture results back and if needed for change in antibiotic She should be able to get the Covid booster as long as she is feeling better even if she is on the antibiotic maximum immunity effect would be 2 weeks she is currently off the methotrexate for that reason. Iron vit d and b12  And other  Labs  Orders. -Patient advised to return or notify health care team  if  new concerns arise. Review assess counsel plan visit 30 minutes There are no Patient Instructions on file for this visit.   Standley Brooking. Jiles Goya M.D.

## 2020-08-23 ENCOUNTER — Other Ambulatory Visit: Payer: Self-pay | Admitting: Internal Medicine

## 2020-08-23 ENCOUNTER — Encounter: Payer: Self-pay | Admitting: Internal Medicine

## 2020-08-23 ENCOUNTER — Other Ambulatory Visit: Payer: Self-pay

## 2020-08-23 ENCOUNTER — Telehealth (INDEPENDENT_AMBULATORY_CARE_PROVIDER_SITE_OTHER): Payer: BC Managed Care – PPO | Admitting: Internal Medicine

## 2020-08-23 VITALS — Temp 99.0°F

## 2020-08-23 DIAGNOSIS — M0609 Rheumatoid arthritis without rheumatoid factor, multiple sites: Secondary | ICD-10-CM | POA: Diagnosis not present

## 2020-08-23 DIAGNOSIS — Z7901 Long term (current) use of anticoagulants: Secondary | ICD-10-CM

## 2020-08-23 DIAGNOSIS — Z79899 Other long term (current) drug therapy: Secondary | ICD-10-CM

## 2020-08-23 DIAGNOSIS — R509 Fever, unspecified: Secondary | ICD-10-CM

## 2020-08-23 DIAGNOSIS — Z789 Other specified health status: Secondary | ICD-10-CM

## 2020-08-23 DIAGNOSIS — R3989 Other symptoms and signs involving the genitourinary system: Secondary | ICD-10-CM

## 2020-08-23 LAB — URINE CULTURE
MICRO NUMBER:: 11733064
SPECIMEN QUALITY:: ADEQUATE

## 2020-08-23 NOTE — Progress Notes (Signed)
Virtual Visit via Video Note  I connected with@ on 08/27/20 at 10:00 AM EDT by a video enabled telemedicine application and verified that I am speaking with the correct person using two identifiers. Location patient: home Location provider: home office Persons participating in the virtual visit: patient, provider  [place] national recommendations  regarding COVID 19 pandemic   video visit is advised over in office visit for this patient.  Patient aware  of the limitations of evaluation and management by telemedicine and  availability of in person appointments. and agreed to proceed.   HPI: [name] presents for video visit after she left our visit 2 days ago Onset of body aches and fever and headache.  No unusual rash is worried about Covid but no exposures. She is under treatment for UTI and her symptoms have improved in that area.  See above.  Mild nasal congestion. She has been immunized for Covid 19 planning on getting booster soon   ROS: See pertinent positives and negatives per HPI. No exposures  grocery store  Outside  [place]    Past Medical History:  Diagnosis Date  . Adjustment disorder with anxiety   . B12 deficiency due to diet    is a vegetarian  . Chest pain, unspecified   . Chronic anticoagulation   . Fatty liver    Per patient  . Headache(784.0)    migraines and head pain  . Hx of varicella    As Child  . Iron deficiency anemia, unspecified   . Otalgia, unspecified   . Other and unspecified hyperlipidemia   . Palpitations   . Personal history of venous thrombosis and embolism   . Recurrent UTI   . Rheumatoid arthritis(714.0)    deveshewar  . Unspecified vitamin D deficiency   . UTI (lower urinary tract infection) 11/28/2011   citrobacter       Past Surgical History:  Procedure Laterality Date  . ORIF ULNAR FRACTURE    . TYMPANOSTOMY TUBE PLACEMENT  2009   Right    Family History  Problem Relation Age of Onset  . Dementia Father   . Stroke  Father   . Hyperlipidemia Father   . Diabetes Father   . Parkinsonism Mother   . Lymphoma Other   . Colon cancer Neg Hx     Social History   Tobacco Use  . Smoking status: Never Smoker  . Smokeless tobacco: Never Used  Vaping Use  . Vaping Use: Never used  Substance Use Topics  . Alcohol use: No  . Drug use: Never      Current Outpatient Medications:  .  alendronate (FOSAMAX) 70 MG tablet, Take 1 tablet (70 mg total) by mouth once a week. Take with a full glass of water on an empty stomach., Disp: 4 tablet, Rfl: 2 .  ALPRAZolam (XANAX) 0.25 MG tablet, Take 1 tablet (0.25 mg total) by mouth 2 (two) times daily as needed for anxiety., Disp: 30 tablet, Rfl: 0 .  Calcium Carb-Cholecalciferol 600-800 MG-UNIT TABS, Take by mouth., Disp: , Rfl:  .  Cholecalciferol (VITAMIN D3) 2000 UNITS TABS, Take 1 tablet by mouth daily., Disp: , Rfl:  .  cycloSPORINE (RESTASIS) 0.05 % ophthalmic emulsion, Place 1 drop into both eyes daily., Disp: , Rfl:  .  Eflornithine HCl 13.9 % cream, Apply topically daily. , Disp: , Rfl:  .  folic acid (FOLVITE) 1 MG tablet, TAKE 2 TABLETS(2 MG) BY MOUTH DAILY, Disp: 180 tablet, Rfl: 3 .  hydroxychloroquine (PLAQUENIL)  200 MG tablet, TAKE 1 TABLET(200 MG) BY MOUTH DAILY, Disp: 90 tablet, Rfl: 0 .  methotrexate (RHEUMATREX) 2.5 MG tablet, TAKE 8 TABLETS BY MOUTH ONCE A WEEK, Disp: 96 tablet, Rfl: 0 .  mineral/vitamin supplement (MULTIGEN) 70 MG TABS tablet, TAKE 1 TABLET BY MOUTH DAILY, Disp: 90 tablet, Rfl: 3 .  SUMAtriptan (IMITREX) 50 MG tablet, TAKE ONE TABLET BY MOUTH AS NEEDED. MAY REPEAT IN 2 TO 4 HOURS IF NEEDED., Disp: 9 tablet, Rfl: 5 .  warfarin (COUMADIN) 2.5 MG tablet, TAKE 2 TABLETS BY MOUTH DAILY, EXCEPT ON MONDAY AND THURSDAY TAKE 1 TABLET. OR AS DIRECTED BY ANTICOAGULATION CLINIC, Disp: 180 tablet, Rfl: 1  EXAM: BP Readings from Last 3 Encounters:  08/21/20 110/70  04/04/20 106/72  02/08/20 106/60    VITALS per patient if  applicable:  GENERAL: alert, oriented, appears well and in no acute distress but   Anxious    Non toxic  No resp distress   HEENT: atraumatic, conjunttiva clear, no obvious abnormalities on inspection of external nose and ears  NECK: normal movements of the head and neck  LUNGS: on inspection no signs of respiratory distress, breathing rate appears normal, no obvious gross SOB, gasping or wheezing  CV: no obvious cyanosis  MS: moves all visible extremities without noticeable abnormality  PSYCH/NEURO: pleasant and cooperative, no obvious depression or anxiety, speech and thought processing grossly intact Lab Results  Component Value Date   WBC 7.4 07/26/2020   HGB 15.0 07/26/2020   HCT 45.2 (H) 07/26/2020   PLT 250 07/26/2020   GLUCOSE 101 (H) 07/26/2020   CHOL 194 09/13/2019   TRIG 139.0 09/13/2019   HDL 66.20 09/13/2019   LDLDIRECT 97.3 05/25/2013   LDLCALC 100 (H) 09/13/2019   ALT 12 07/26/2020   AST 16 07/26/2020   NA 140 07/26/2020   K 4.9 07/26/2020   CL 104 07/26/2020   CREATININE 0.77 07/26/2020   BUN 10 07/26/2020   CO2 30 07/26/2020   TSH 3.21 09/13/2019   INR 1.6 (A) 07/30/2020   HGBA1C 6.0 07/20/2018    ASSESSMENT AND PLAN:  Discussed the following assessment and plan:    ICD-10-CM   1. Febrile illness  R50.9   2. Medically complex   Z78.9   3. Rheumatoid arthritis of multiple sites with negative rheumatoid factor (HCC)  M06.09   4. Chronic anticoagulation  Z79.01   5. Suspected UTI   R39.89    doesn not seem cause of fever  onmacrobid  u cx pending    Under treatment for suspected UTI culture pending has RA off her methotrexate this week low risk exposures but high risk if she gets Covid.  Differential diagnosis sounds viral based on today's presentation.  This situation is certainly increased her baseline anxiety.  We will follow and try to troubleshoot if needed over the weekend. Counseled.  Get a PCR test   preferably today so we get the results  back within 5 days if positive she is very worried about pack Slo-Bid if an option with interference of medications if she cannot get at [facility] I directed her to [facility] website with her appointments on the schedule [place] this evening. Advised we get the test first and then decide what is the best intervention. No exposures for tick related fever and I doubt it is related to her antibiotic we began or a progressive UTI however will check on urine culture status to help with more information.  Expectant management and discussion of plan and treatment with opportunity to ask questions and all were answered. The patient agreed with the plan and demonstrated an understanding of the instructions.   Advised to call back or seek an in-person evaluation if worsening  or having  further concerns . Return if symptoms worsen or fail to improve, for depending on results.    Shanon Ace, MD

## 2020-08-23 NOTE — Progress Notes (Signed)
Bacterial isolation ,UTI with  bacteria that  should respond to  medication  (given macrobid for bladder infection).IF your symptoms are better I dont think this infection is causing your fever based on what you reported today  but can reassess follow if any concern.    I am going to place an order for  urinalysis with micro at the [facility] [address] building    ,to recheck the urinalysis  to make sure clearing up   . Can  do this Friday or Monday  ( walk in 8-5 pm no appt needed ) . In addition I will order  cbc and diff and cmp  to get more information on cause of the fever.

## 2020-08-23 NOTE — Progress Notes (Signed)
Please advise patient to discontinue methotrexate and Plaquenil.  She may restart the medications once the urinary tract infection is completely resolved.

## 2020-08-24 ENCOUNTER — Encounter: Payer: Self-pay | Admitting: Rheumatology

## 2020-08-24 NOTE — Telephone Encounter (Signed)
If she is positive I would recommend receiving the monoclonal antibody infusion.   She should continue to hold MTX until the infection has completely resolved.  If she is covid-19 positive we recommend continuing to hold MTX 2-3 weeks after symptoms resolution.

## 2020-08-27 ENCOUNTER — Ambulatory Visit (INDEPENDENT_AMBULATORY_CARE_PROVIDER_SITE_OTHER)
Admission: RE | Admit: 2020-08-27 | Discharge: 2020-08-27 | Disposition: A | Discharge status: Home / Self Care | Payer: BC Managed Care – PPO | Source: Ambulatory Visit | Attending: Internal Medicine | Admitting: Internal Medicine

## 2020-08-27 ENCOUNTER — Other Ambulatory Visit (INDEPENDENT_AMBULATORY_CARE_PROVIDER_SITE_OTHER): Payer: BC Managed Care – PPO

## 2020-08-27 ENCOUNTER — Other Ambulatory Visit: Payer: Self-pay

## 2020-08-27 ENCOUNTER — Ambulatory Visit: Payer: BC Managed Care – PPO | Admitting: Nurse Practitioner

## 2020-08-27 DIAGNOSIS — R059 Cough, unspecified: Secondary | ICD-10-CM | POA: Diagnosis not present

## 2020-08-27 DIAGNOSIS — Z87440 Personal history of urinary (tract) infections: Secondary | ICD-10-CM

## 2020-08-27 LAB — URINALYSIS, ROUTINE W REFLEX MICROSCOPIC
Bilirubin Urine: NEGATIVE
Ketones, ur: NEGATIVE
Nitrite: NEGATIVE
Specific Gravity, Urine: <=1.005 — AB (ref 1.000–1.030)
Total Protein, Urine: NEGATIVE
Urine Glucose: NEGATIVE
Urobilinogen, UA: 0.2 (ref 0.0–1.0)
pH: 6 (ref 5.0–8.0)

## 2020-08-27 NOTE — Telephone Encounter (Signed)
Glad you are feeling some better missed your message on Friday afternoon. Do not need to repeat your PCR test Following up the urine is a good idea if ongoing problem.  Or after treatment or anytime after Would stay off of the methotrexate Plaquenil until you are better.

## 2020-08-27 NOTE — Telephone Encounter (Signed)
Okay to put an order in for chest x-ray at the [facility] office building diagnosis cough negative Covid  Would get a repeat urinalysis at the [facility] office I do not think we need to reculture it.  If you are feeling better.  Do not need to do another Covid test

## 2020-08-27 NOTE — Progress Notes (Signed)
Follow-up urinalysis is improved and normal microscopic.

## 2020-08-28 ENCOUNTER — Other Ambulatory Visit: Payer: BC Managed Care – PPO

## 2020-08-28 ENCOUNTER — Other Ambulatory Visit: Payer: Self-pay

## 2020-08-28 NOTE — Progress Notes (Signed)
Chief Complaint  Patient presents with  . Rash    Patient complains of rash on legs, x2 days,   . Cough    Patient complains of cough, x4 days,    HPI: [name] 60 y.o. come in for rash and fu after illness cough no more fever headache myalgias but still has a dry cough but no shortness of breath.  Has had 3 - Covid tests are negative including 2 PCR's.  The rash on her lower extremities that does not itch is starting to fade began about 2 days ago a day after she finished the antibiotic.  Has never had this before. She has been off the methotrexate and Plaquenil.  Back on her regular dose of Coumadin.  May need to do her INR. No bleeding no one else sick at home although her sons come back from a trip and has questions about isolation.  ROS: See pertinent positives and negatives per HPI.  Past Medical History:  Diagnosis Date  . Adjustment disorder with anxiety   . B12 deficiency due to diet    is a vegetarian  . Chest pain, unspecified   . Chronic anticoagulation   . Fatty liver    Per patient  . Headache(784.0)    migraines and head pain  . Hx of varicella    As Child  . Iron deficiency anemia, unspecified   . Otalgia, unspecified   . Other and unspecified hyperlipidemia   . Palpitations   . Personal history of venous thrombosis and embolism   . Recurrent UTI   . Rheumatoid arthritis(714.0)    deveshewar  . Unspecified vitamin D deficiency   . UTI (lower urinary tract infection) 11/28/2011   citrobacter       Family History  Problem Relation Age of Onset  . Dementia Father   . Stroke Father   . Hyperlipidemia Father   . Diabetes Father   . Parkinsonism Mother   . Lymphoma Other   . Colon cancer Neg Hx     Social History   Socioeconomic History  . Marital status: Married    Spouse name: Not on file  . Number of children: 1  . Years of education: Not on file  . Highest education level: Not on file  Occupational History  . Occupation: home  maker  Tobacco Use  . Smoking status: Never Smoker  . Smokeless tobacco: Never Used  Vaping Use  . Vaping Use: Never used  Substance and Sexual Activity  . Alcohol use: No  . Drug use: Never  . Sexual activity: Not on file  Other Topics Concern  . Not on file  Social History Narrative   Regular Exercise-no   20 yrs in [place]   Son at [employer]   From [place].   Married [place]  of 3    G1P1            Social Determinants of Health   Financial Resource Strain: Not on file  Food Insecurity: Not on file  Transportation Needs: Not on file  Physical Activity: Not on file  Stress: Not on file  Social Connections: Not on file    Outpatient Medications Prior to Visit  Medication Sig Dispense Refill  . alendronate (FOSAMAX) 70 MG tablet Take 1 tablet (70 mg total) by mouth once a week. Take with a full glass of water on an empty stomach. 4 tablet 2  . ALPRAZolam (XANAX) 0.25 MG tablet Take 1 tablet (0.25 mg total) by  mouth 2 (two) times daily as needed for anxiety. 30 tablet 0  . Calcium Carb-Cholecalciferol 600-800 MG-UNIT TABS Take by mouth.    . Cholecalciferol (VITAMIN D3) 2000 UNITS TABS Take 1 tablet by mouth daily.    . cycloSPORINE (RESTASIS) 0.05 % ophthalmic emulsion Place 1 drop into both eyes daily.    . Eflornithine HCl 13.9 % cream Apply topically daily.     . folic acid (FOLVITE) 1 MG tablet TAKE 2 TABLETS(2 MG) BY MOUTH DAILY 180 tablet 3  . hydroxychloroquine (PLAQUENIL) 200 MG tablet TAKE 1 TABLET(200 MG) BY MOUTH DAILY 90 tablet 0  . methotrexate (RHEUMATREX) 2.5 MG tablet TAKE 8 TABLETS BY MOUTH ONCE A WEEK 96 tablet 0  . mineral/vitamin supplement (MULTIGEN) 70 MG TABS tablet TAKE 1 TABLET BY MOUTH DAILY 90 tablet 3  . SUMAtriptan (IMITREX) 50 MG tablet TAKE ONE TABLET BY MOUTH AS NEEDED. MAY REPEAT IN 2 TO 4 HOURS IF NEEDED. 9 tablet 5  . warfarin (COUMADIN) 2.5 MG tablet TAKE 2 TABLETS BY MOUTH DAILY, EXCEPT ON MONDAY AND THURSDAY TAKE 1 TABLET. OR AS DIRECTED BY  ANTICOAGULATION CLINIC 180 tablet 1   No facility-administered medications prior to visit.     EXAM:  BP 110/66 (BP Location: Right Arm, Patient Position: Sitting, Cuff Size: Normal)   Pulse 70   Temp (!) 97.5 F (36.4 C) (Oral)   Ht 5' 3.5" (1.613 m)   Wt 158 lb 3.2 oz (71.8 kg)   SpO2 96%   BMI 27.58 kg/m   Body mass index is 27.58 kg/m.  GENERAL: vitals reviewed and listed above, alert, oriented, appears well hydrated and in no acute distress HEENT: atraumatic, conjunctiva  clear, no obvious abnormalities on inspection of external nose and ears OP : masked  NECK: no obvious masses on inspection palpation  LUNGS: clear to auscultation bilaterally, no wheezes, rales or rhonchi, good air movement CV: HRRR, no clubbing cyanosis or  peripheral edema nl cap refill  MS: moves all extremities without noticeable focal  Abnormality SKIN  See picture  Fading  Follicular rash  Knee down sparse  Upper thighs  ? Upper arems no tuncal rash  PSYCH: pleasant and cooperative,anxious  Lab Results  Component Value Date   WBC 12.1 (H) 08/29/2020   HGB 14.1 08/29/2020   HCT 43.0 08/29/2020   PLT 297.0 08/29/2020   GLUCOSE 88 08/29/2020   CHOL 194 09/13/2019   TRIG 139.0 09/13/2019   HDL 66.20 09/13/2019   LDLDIRECT 97.3 05/25/2013   LDLCALC 100 (H) 09/13/2019   ALT 24 08/29/2020   AST 28 08/29/2020   NA 140 08/29/2020   K 4.2 08/29/2020   CL 105 08/29/2020   CREATININE 0.83 08/29/2020   BUN 12 08/29/2020   CO2 27 08/29/2020   TSH 3.21 09/13/2019   INR 2.4 08/29/2020   HGBA1C 6.0 07/20/2018   BP Readings from Last 3 Encounters:  08/29/20 110/66  08/21/20 110/70  04/04/20 106/72    ASSESSMENT AND PLAN:  Discussed the following assessment and plan:  Cough - convalescing rti - Plan: CBC with Differential/Platelet, Comprehensive metabolic panel, C-reactive protein, Sedimentation rate, Sedimentation rate, C-reactive protein, Comprehensive metabolic panel, CBC with  Differential/Platelet, CANCELED: Protime-INR  Medication management - Plan: CBC with Differential/Platelet, Comprehensive metabolic panel, C-reactive protein, Sedimentation rate, Sedimentation rate, C-reactive protein, Comprehensive metabolic panel, CBC with Differential/Platelet, CANCELED: Protime-INR  Chronic anticoagulation - Plan: CBC with Differential/Platelet, Comprehensive metabolic panel, C-reactive protein, Sedimentation rate, POCT INR, Sedimentation rate, C-reactive protein, Comprehensive  metabolic panel, CBC with Differential/Platelet, CANCELED: Protime-INR  Rash and other nonspecific skin eruption - Plan: CBC with Differential/Platelet, Comprehensive metabolic panel, C-reactive protein, Sedimentation rate, Sedimentation rate, C-reactive protein, Comprehensive metabolic panel, CBC with Differential/Platelet, CANCELED: Protime-INR  Rheumatoid arthritis of multiple sites with negative rheumatoid factor ([place]) - Plan: CBC with Differential/Platelet, Comprehensive metabolic panel, C-reactive protein, Sedimentation rate, Sedimentation rate, C-reactive protein, Comprehensive metabolic panel, CBC with Differential/Platelet, CANCELED: Protime-INR UTI appears resolved. Will send info to dr Junious Silk  Rest  Infection appears resolving  Exam reassuring and c xray nl  Uncertain cause of the rash post viral ?  medication ? Lab today It is fading    Lab today and go from there close observation indicated  Can take mucinex   If relapses   Reevaluation  Indicated . Let us know   Next week how doing  Cough or if worse and follow the rash  -Patient advised to return or notify health care team  if  new concerns arise.  Patient Instructions  Lab today   Chest is  Clear today  And It still think viral infection that is recovering  Expect   Cough for another week.  And then better  [facility] to take mucinex.   If  Not getting better or relapsing fever .   The rash could be from  he illness   Other ,  Even the  medication even though off  When began  Lets follow .       Standley Brooking. Avanni Turnbaugh M.D.

## 2020-08-28 NOTE — Progress Notes (Signed)
Good news   normal chest x ray

## 2020-08-28 NOTE — Telephone Encounter (Signed)
Do not think you had COVID with the negative testing Uncertain if it is related to medication or the viral illness the most likely causes itself   will check at your appointment

## 2020-08-29 ENCOUNTER — Ambulatory Visit (INDEPENDENT_AMBULATORY_CARE_PROVIDER_SITE_OTHER): Payer: BC Managed Care – PPO | Admitting: Internal Medicine

## 2020-08-29 ENCOUNTER — Encounter: Payer: Self-pay | Admitting: Internal Medicine

## 2020-08-29 VITALS — BP 110/66 | HR 70 | Temp 97.5°F | Ht 63.5 in | Wt 158.2 lb

## 2020-08-29 DIAGNOSIS — R059 Cough, unspecified: Secondary | ICD-10-CM | POA: Diagnosis not present

## 2020-08-29 DIAGNOSIS — Z79899 Other long term (current) drug therapy: Secondary | ICD-10-CM

## 2020-08-29 DIAGNOSIS — R21 Rash and other nonspecific skin eruption: Secondary | ICD-10-CM | POA: Diagnosis not present

## 2020-08-29 DIAGNOSIS — Z7901 Long term (current) use of anticoagulants: Secondary | ICD-10-CM | POA: Diagnosis not present

## 2020-08-29 DIAGNOSIS — M0609 Rheumatoid arthritis without rheumatoid factor, multiple sites: Secondary | ICD-10-CM

## 2020-08-29 LAB — COMPREHENSIVE METABOLIC PANEL
ALT: 24 U/L (ref 0–35)
AST: 28 U/L (ref 0–37)
Albumin: 3.7 g/dL (ref 3.5–5.2)
Alkaline Phosphatase: 60 U/L (ref 39–117)
BUN: 12 mg/dL (ref 6–23)
CO2: 27 mEq/L (ref 19–32)
Calcium: 9.2 mg/dL (ref 8.4–10.5)
Chloride: 105 mEq/L (ref 96–112)
Creatinine, Ser: 0.83 mg/dL (ref 0.40–1.20)
GFR: 75.69 mL/min (ref >60.00)
Glucose, Bld: 88 mg/dL (ref 70–99)
Potassium: 4.2 mEq/L (ref 3.5–5.1)
Sodium: 140 mEq/L (ref 135–145)
Total Bilirubin: 0.5 mg/dL (ref 0.2–1.2)
Total Protein: 7.1 g/dL (ref 6.0–8.3)

## 2020-08-29 LAB — CBC WITH DIFFERENTIAL/PLATELET
Basophils Absolute: 0 10*3/uL (ref 0.0–0.1)
Basophils Relative: 0.4 % (ref 0.0–3.0)
Eosinophils Absolute: 0.8 10*3/uL — ABNORMAL HIGH (ref 0.0–0.7)
Eosinophils Relative: 6.8 % — ABNORMAL HIGH (ref 0.0–5.0)
HCT: 43 % (ref 36.0–46.0)
Hemoglobin: 14.1 g/dL (ref 12.0–15.0)
Lymphocytes Relative: 29.5 % (ref 12.0–46.0)
Lymphs Abs: 3.6 10*3/uL (ref 0.7–4.0)
MCHC: 32.6 g/dL (ref 30.0–36.0)
MCV: 84 fl (ref 78.0–100.0)
Monocytes Absolute: 1.1 10*3/uL — ABNORMAL HIGH (ref 0.1–1.0)
Monocytes Relative: 8.8 % (ref 3.0–12.0)
Neutro Abs: 6.6 10*3/uL (ref 1.4–7.7)
Neutrophils Relative %: 54.5 % (ref 43.0–77.0)
Platelets: 297 10*3/uL (ref 150.0–400.0)
RBC: 5.12 Mil/uL — ABNORMAL HIGH (ref 3.87–5.11)
RDW: 13.8 % (ref 11.5–15.5)
WBC: 12.1 10*3/uL — ABNORMAL HIGH (ref 4.0–10.5)

## 2020-08-29 LAB — POCT INR: INR: 2.4 (ref 2.0–3.0)

## 2020-08-29 LAB — C-REACTIVE PROTEIN: CRP: 5.6 mg/dL (ref 0.5–20.0)

## 2020-08-29 LAB — SEDIMENTATION RATE: Sed Rate: 54 mm/hr — ABNORMAL HIGH (ref 0–30)

## 2020-08-29 NOTE — Patient Instructions (Addendum)
Lab today   Chest is  Clear today  And It still think viral infection that is recovering  Expect   Cough for another week.  And then better  [facility] to take mucinex.   If  Not getting better or relapsing fever .   The rash could be from  he illness   Other ,  Even the medication even though off  When began  Lets follow .

## 2020-08-29 NOTE — Progress Notes (Signed)
CRP inflammation marker is not increased which would be more typical of RA although ESR is somewhat increased could be from any reaction.infection etc  Blood count shows slight increase in eosinophils which tend to go up with allergy or infection but nonspecific.  Routing information to Dr. Keturah Barre Inr in range for therapeutics  forwarding to [facility]  Since getting better  Let me know how doing next week.

## 2020-08-29 NOTE — Progress Notes (Signed)
Office Visit Note  Patient: [name]             Date of Birth: February 22, 1961           MRN: [id number]             PCP: Burnis Medin, MD Referring: Burnis Medin, MD Visit Date: 09/12/2020 Occupation: @GUAROCC @  Subjective:  Other (Bilateral shoulder pain )   History of Present Illness: Naima Veldhuizen is a 60 y.o. female with the history of rheumatoid arthritis and osteoarthritis.  According the patient about 5 weeks ago she developed some upper respiratory tract infection.  She also had fever and flulike symptoms.  She was treated for COVID 2 or 3 times which was negative.  She states the symptoms completely resolved except she still have some dry hacking cough.  She has been holding methotrexate and Plaquenil.  She has been having some discomfort in her bilateral shoulders.  Also noticed a rash on her lower extremities about 3 weeks ago while she was in the shower.  She states it lasted for about 3 days and then resolved.  She has had no recurrence of rash since then.  Activities of Daily Living:  Patient reports morning stiffness for 5-10  minutes.   Patient Denies nocturnal pain.  Difficulty dressing/grooming: Denies Difficulty climbing stairs: Denies Difficulty getting out of chair: Denies Difficulty using hands for taps, buttons, cutlery, and/or writing: Denies  Review of Systems  Constitutional: Positive for fatigue. Negative for night sweats, weight gain and weight loss.  HENT: Positive for mouth dryness. Negative for mouth sores, trouble swallowing, trouble swallowing and nose dryness.   Eyes: Positive for dryness. Negative for pain, redness, itching and visual disturbance.  Respiratory: Positive for cough. Negative for shortness of breath and difficulty breathing.   Cardiovascular: Negative for chest pain, palpitations, hypertension, irregular heartbeat and swelling in legs/feet.  Gastrointestinal: Negative for blood in stool, constipation and diarrhea.   Endocrine: Negative for increased urination.  Genitourinary: Negative for difficulty urinating and vaginal dryness.  Musculoskeletal: Positive for arthralgias, joint pain, myalgias, morning stiffness, muscle tenderness and myalgias. Negative for joint swelling and muscle weakness.  Skin: Negative for color change, rash, hair loss, redness, skin tightness, ulcers and sensitivity to sunlight.  Allergic/Immunologic: Negative for susceptible to infections.  Neurological: Positive for weakness. Negative for dizziness, headaches, memory loss and night sweats.  Hematological: Positive for bruising/bleeding tendency. Negative for swollen glands.  Psychiatric/Behavioral: Negative for depressed mood, confusion and sleep disturbance. The patient is not nervous/anxious.     PMFS History:  Patient Active Problem List   Diagnosis Date Noted  . 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[place]) 01/06/2018  . Long term (current) use of anticoagulants 02/11/2017  . Sicca syndrome ([place]) 12/22/2016  . ANA positive 12/22/2016  . 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 Migraine 03/31/2016  . Anxiety 03/31/2016  . High risk medication use 03/31/2016  . Primary osteoarthritis of both feet 03/31/2016  . Primary osteoarthritis of both knees 03/31/2016  . Stiffness of left hand joint 11/27/2014  . Elevated IOP 06/09/2014  . Long term current use of systemic steroids 06/09/2014  . Medically complex patient 06/09/2014  . Current use of steroid medication 01/09/2014  . Xerosis of skin 12/07/2013  . Encounter for therapeutic drug monitoring 06/09/2013  . B12 deficiency 05/31/2013  . Iron deficiency 05/31/2013  . Visit for preventive health examination 05/25/2012  . Mammogram abnormal 04/12/2012  . 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  Vaginal atrophy 01/23/2012  . History of recurrent UTI (urinary tract infection) 01/23/2012  . Osteoporosis 04/29/2011  . Nonspecific abnormal results of liver function  study 12/08/2010  . Chronic anticoagulation   . Anticoagulant long-term use 07/15/2010  . 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 Chronic cough 03/19/2009  . CHEST PAIN-UNSPECIFIED 08/26/2008  . PALPITATIONS, RECURRENT 07/24/2008  . Vitamin D deficiency 05/30/2008  . HYPERLIPIDEMIA 05/30/2008  . IRON DEFICIENCY 05/30/2008  . Adjustment disorder with anxiety 05/30/2008  . Rheumatoid arthritis ([place]) 05/30/2008  . PULMONARY EMBOLISM, HX OF 05/30/2008    Past Medical History:  Diagnosis Date  . Adjustment disorder with anxiety   . B12 deficiency due to diet    is a vegetarian  . Chest pain, unspecified   . Chronic anticoagulation   . Fatty liver    Per patient  . Headache(784.0)    migraines and head pain  . Hx of varicella    As Child  . Iron deficiency anemia, unspecified   . Otalgia, unspecified   . Other and unspecified hyperlipidemia   . Palpitations   . Personal history of venous thrombosis and embolism   . Recurrent UTI   . Rheumatoid arthritis(714.0)    deveshewar  . Unspecified vitamin D deficiency   . UTI (lower urinary tract infection) 11/28/2011   citrobacter       Family History  Problem Relation Age of Onset  . Dementia Father   . Stroke Father   . Hyperlipidemia Father   . Diabetes Father   . Parkinsonism Mother   . Lymphoma Other   . Colon cancer Neg Hx    Past Surgical History:  Procedure Laterality Date  . ORIF ULNAR FRACTURE    . TYMPANOSTOMY TUBE PLACEMENT  2009   Right   Social History   Social History Narrative   Regular Exercise-no   20 yrs in [place]   Son at [employer]   From [place].   Married HH  of 3    G1P1            Immunization History  Administered Date(s) Administered  . Influenza,inj,Quad PF,6+ Mos 04/19/2013, 06/27/2015, 03/05/2016, 02/11/2017, 03/15/2018, 02/23/2019, 04/02/2020  . PFIZER(Purple Top)SARS-COV-2 Vaccination 08/06/2019, 08/25/2019, 01/12/2020  . PPD Test 07/21/2016  . Tdap 05/31/2013      Objective: Vital Signs: BP 104/66 (BP Location: Right Arm, Patient Position: Sitting, Cuff Size: Normal)   Pulse 63   Resp 14   Ht 5' 3.5" (1.613 m)   Wt 156 lb 12.8 oz (71.1 kg)   BMI 27.34 kg/m    Physical Exam Vitals and nursing note reviewed.  Constitutional:      Appearance: She is well-developed.  HENT:     Head: Normocephalic and atraumatic.  Eyes:     Conjunctiva/sclera: Conjunctivae normal.  Cardiovascular:     Rate and Rhythm: Normal rate and regular rhythm.     Heart sounds: Normal heart sounds.  Pulmonary:     Effort: Pulmonary effort is normal.     Breath sounds: Normal breath sounds.  Abdominal:     General: Bowel sounds are normal.     Palpations: Abdomen is soft.  Musculoskeletal:     Cervical back: Normal range of motion.  Lymphadenopathy:     Cervical: No cervical adenopathy.  Skin:    General: Skin is warm and dry.     Capillary Refill: Capillary refill takes less than 2 seconds.  Neurological:     Mental Status: She is alert and oriented  to person, place, and time.  Psychiatric:        Behavior: Behavior normal.      Musculoskeletal Exam: C-spine was in good range of motion.  She has some discomfort range of motion of bilateral shoulder joints.  Elbow joints, wrist joints, MCPs PIPs and DIPs with good range of motion with no synovitis.  Hip joints, knee joints, ankles, MTPs and PIPs with good range of motion with no synovitis.  CDAI Exam: CDAI Score: 2.8  Patient Global: 4 mm; Provider Global: 4 mm Swollen: 0 ; Tender: 2  Joint Exam 09/12/2020      Right  Left  Glenohumeral   Tender   Tender     Investigation: No additional findings.  Imaging: DG Chest 2 View  Result Date: 08/28/2020 CLINICAL DATA:  60 year old female with cough EXAM: CHEST - 2 VIEW COMPARISON:  05/27/2018 FINDINGS: Cardiomediastinal silhouette unchanged in size and contour. No pneumothorax. No pleural effusion.  No confluent airspace disease. Coarsened interstitial  markings without interlobular septal thickening. No central vascular congestion. Degenerative changes spine.  No acute displaced fracture IMPRESSION: Negative for acute cardiopulmonary disease Electronically Signed   By: Corrie Mckusick D.O.   On: 08/28/2020 09:16    Recent Labs: Lab Results  Component Value Date   WBC 12.1 (H) 08/29/2020   HGB 14.1 08/29/2020   PLT 297.0 08/29/2020   NA 140 08/29/2020   K 4.2 08/29/2020   CL 105 08/29/2020   CO2 27 08/29/2020   GLUCOSE 88 08/29/2020   BUN 12 08/29/2020   CREATININE 0.83 08/29/2020   BILITOT 0.5 08/29/2020   ALKPHOS 60 08/29/2020   AST 28 08/29/2020   ALT 24 08/29/2020   PROT 7.1 08/29/2020   ALBUMIN 3.7 08/29/2020   CALCIUM 9.2 08/29/2020   GFRAA 96 07/26/2020    Speciality Comments: PLQ Eye Exam: 08/08/2020 WNL @ [facility] Follow up 6 months  Procedures:  No procedures performed Allergies: Boniva [ibandronate sodium], Ivp dye [iodinated diagnostic agents], Other, Risedronate sodium, and Risedronate sodium [risedronate sodium]   Assessment / Plan:     Visit Diagnoses: Rheumatoid arthritis of multiple sites with negative rheumatoid factor (HCC) - RF negative, anti-CCP positive, ANA positive  -patient believes that she had a recent flare of rheumatoid arthritis that she has been off methotrexate and Plaquenil.  She has been having increased discomfort in her shoulders.  Her sed rate was also elevated after the infection.  We will repeat sed rate today.  Plan: Sedimentation rate, Rheumatoid factor, Cyclic citrul peptide antibody, IgG  High risk medication use -she is holding Plaquenil 200 mg by mouth daily, Methotrexate 8 tablets by mouth every week, and folic acid 2 mg by mouth daily.PLQ Eye Exam: 09/26/2019 -I advised her to resume hydroxychloroquine.  She may resume methotrexate if the labs are normal.  Plan: CBC with Differential/Platelet, QuantiFERON-TB Gold Plus.  She has been fully vaccinated against COVID-19.  She will  get a booster after her infection resolves.  Pneumococcal and Shingrix vaccine was also advised.  Primary osteoarthritis of both knees-she has chronic discomfort but no synovitis was noted.  Primary osteoarthritis of both feet-she has been wearing proper fitting shoes which has been helpful.  Sicca syndrome (HCC)-she continues to have mild sicca symptoms.  Rash and other nonspecific skin eruption -she developed a rash on her bilateral lower extremities while she was showering and shaved her legs.  She brought some pictures of the rash.  She states the rash lasted for 24  hours and faded in 3 days.  I believe it was most likely related to the shaving.  I will check Penninger today.  Plan: Pan-ANCA  Age related osteoporosis, unspecified pathological fracture presence - 02/02/20 T-score DualFemur Neck Left  02/02/2020 T score  -2.8, BMD 0.649 g/cm2. alendronate 70 mg p.o. weekly.   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  History of hyperlipidemia-dietary modifications and exercise was discussed.  Fatty liver  History of anemia  History of migraine  Frequent UTI - she is followed by urology.  Orders: Orders Placed This Encounter  Procedures  . CBC with Differential/Platelet  . Sedimentation rate  . Rheumatoid factor  . Cyclic citrul peptide antibody, IgG  . Pan-ANCA  . QuantiFERON-TB Gold Plus   No orders of the defined types were placed in this encounter.  .  Follow-Up Instructions: Return in about 5 months (around 02/12/2021) for Rheumatoid arthritis, Osteoarthritis.   Bo Merino, MD  Note - This record has been created using Editor, commissioning.  Chart creation errors have been sought, but may not always  have been located. Such creation errors do not reflect on  the standard of medical care.

## 2020-08-30 ENCOUNTER — Ambulatory Visit (INDEPENDENT_AMBULATORY_CARE_PROVIDER_SITE_OTHER): Payer: BC Managed Care – PPO | Admitting: Psychology

## 2020-08-30 DIAGNOSIS — F411 Generalized anxiety disorder: Secondary | ICD-10-CM | POA: Diagnosis not present

## 2020-08-30 MED ORDER — CEFDINIR 300 MG PO CAPS: 300.0000 mg | ORAL_CAPSULE | Freq: Two times a day (BID) | ORAL | 0 refills | Status: DC

## 2020-08-30 NOTE — Telephone Encounter (Signed)
Yes the blood tests show  Reaction fo body response to many things  infection is a common one of them ,  doesn't always tell us what kind of infection.    I will send in  omnicef  For 5 days   ( antibiotic that should  treat uti if relapses  (also  Some bacterial  Respiratory infections   In case)

## 2020-09-03 NOTE — Progress Notes (Signed)
Patient may restart immunosuppressive agents once symptoms completely resolved and the repeat urine culture is negative. Thank you for the update.

## 2020-09-04 NOTE — Progress Notes (Signed)
Didn't see this message   until after weekend  but she should be minimally contagious  if still  improving and no  fever.

## 2020-09-05 NOTE — Telephone Encounter (Signed)
If no fever now and cough getting better   Would wait  Doubt contagion  but can use mask in case .   Cough can last  2 weeks+ or so before resolved  May need fu visit w me next week if  Not continuing to improve .

## 2020-09-06 ENCOUNTER — Encounter: Payer: Self-pay | Admitting: Rheumatology

## 2020-09-06 ENCOUNTER — Encounter: Payer: BC Managed Care – PPO | Admitting: Internal Medicine

## 2020-09-07 ENCOUNTER — Other Ambulatory Visit: Payer: Self-pay

## 2020-09-07 ENCOUNTER — Encounter: Payer: Self-pay | Admitting: Family Medicine

## 2020-09-07 ENCOUNTER — Ambulatory Visit (INDEPENDENT_AMBULATORY_CARE_PROVIDER_SITE_OTHER): Payer: BC Managed Care – PPO | Admitting: Family Medicine

## 2020-09-07 VITALS — BP 110/70 | HR 71 | Temp 97.8°F | Wt 155.0 lb

## 2020-09-07 DIAGNOSIS — J40 Bronchitis, not specified as acute or chronic: Secondary | ICD-10-CM | POA: Diagnosis not present

## 2020-09-07 MED ORDER — OMEPRAZOLE 20 MG PO CPDR: 20.0000 mg | DELAYED_RELEASE_CAPSULE | ORAL | 3 refills | Status: DC | PRN

## 2020-09-07 MED ORDER — AZITHROMYCIN 250 MG PO TABS: ORAL_TABLET | ORAL | 0 refills | Status: DC

## 2020-09-07 NOTE — Progress Notes (Signed)
   Subjective:    Patient ID: [name], female    DOB: November 16, 1960, 60 y.o.   MRN: [id number]  HPI Here for a dry cough that started about 2 weeks ago. The first 2 days she had a headache and low grade fever, but these resolved. No chest pain or SOB. No body aches or NVD. She has tested negative for the Covid virus twice. She saw Dr. Regis Bill on 08-27-20 and she was sent for labs and a CXR. The CXR was clear. The labs showed an elevated WBC to 12.1 with some eosinophilia. She also had a UTI. She was given 5 days of Nitrofurantoin, and the urinary symptoms resolved.    Review of Systems  Constitutional: Negative.   HENT: Negative.   Eyes: Negative.   Respiratory: Positive for cough. Negative for chest tightness, shortness of breath and wheezing.   Cardiovascular: Negative.        Objective:   Physical Exam Constitutional:      Appearance: Normal appearance.  HENT:     Right Ear: Tympanic membrane, ear canal and external ear normal.     Left Ear: Tympanic membrane, ear canal and external ear normal.     Nose: Nose normal.     Mouth/Throat:     Pharynx: Oropharynx is clear.  Eyes:     Conjunctiva/sclera: Conjunctivae normal.  Cardiovascular:     Rate and Rhythm: Normal rate and regular rhythm.     Pulses: Normal pulses.     Heart sounds: Normal heart sounds.  Pulmonary:     Effort: Pulmonary effort is normal. No respiratory distress.     Breath sounds: Normal breath sounds. No stridor. No wheezing, rhonchi or rales.  Lymphadenopathy:     Cervical: No cervical adenopathy.  Neurological:     Mental Status: She is alert.           Assessment & Plan:  Bronchitis, likely atypical. We will treat this with a Zpack. She can use Delsym as needed. She will return on Monday for an INR check.  Alysia Penna, MD

## 2020-09-10 ENCOUNTER — Other Ambulatory Visit: Payer: Self-pay

## 2020-09-10 ENCOUNTER — Ambulatory Visit (INDEPENDENT_AMBULATORY_CARE_PROVIDER_SITE_OTHER): Payer: BC Managed Care – PPO | Admitting: General Practice

## 2020-09-10 DIAGNOSIS — R760 Raised antibody titer: Secondary | ICD-10-CM | POA: Diagnosis not present

## 2020-09-10 DIAGNOSIS — Z7901 Long term (current) use of anticoagulants: Secondary | ICD-10-CM

## 2020-09-10 LAB — POCT INR: INR: 1.4 — AB (ref 2.0–3.0)

## 2020-09-10 NOTE — Patient Instructions (Addendum)
Pre visit review using our clinic review tool, if applicable. No additional management support is needed unless otherwise documented below in the visit note.  Continue to take 2 tablets daily except 1 tablet on Sunday and Thursday.  Re-check in  6 weeks.

## 2020-09-12 ENCOUNTER — Ambulatory Visit (INDEPENDENT_AMBULATORY_CARE_PROVIDER_SITE_OTHER): Payer: BC Managed Care – PPO | Admitting: Rheumatology

## 2020-09-12 ENCOUNTER — Encounter: Payer: Self-pay | Admitting: Rheumatology

## 2020-09-12 VITALS — BP 104/66 | HR 63 | Resp 14 | Ht 63.5 in | Wt 156.8 lb

## 2020-09-12 DIAGNOSIS — Z862 Personal history of diseases of the blood and blood-forming organs and certain disorders involving the immune mechanism: Secondary | ICD-10-CM

## 2020-09-12 DIAGNOSIS — M19071 Primary osteoarthritis, right ankle and foot: Secondary | ICD-10-CM

## 2020-09-12 DIAGNOSIS — M170 Bilateral primary osteoarthritis of knee: Secondary | ICD-10-CM

## 2020-09-12 DIAGNOSIS — R21 Rash and other nonspecific skin eruption: Secondary | ICD-10-CM

## 2020-09-12 DIAGNOSIS — M810 Age-related osteoporosis without current pathological fracture: Secondary | ICD-10-CM

## 2020-09-12 DIAGNOSIS — Z8639 Personal history of other endocrine, nutritional and metabolic disease: Secondary | ICD-10-CM

## 2020-09-12 DIAGNOSIS — M19072 Primary osteoarthritis, left ankle and foot: Secondary | ICD-10-CM

## 2020-09-12 DIAGNOSIS — Z79899 Other long term (current) drug therapy: Secondary | ICD-10-CM

## 2020-09-12 DIAGNOSIS — M3500 Sicca syndrome, unspecified: Secondary | ICD-10-CM

## 2020-09-12 DIAGNOSIS — K760 Fatty (change of) liver, not elsewhere classified: Secondary | ICD-10-CM

## 2020-09-12 DIAGNOSIS — Z8669 Personal history of other diseases of the nervous system and sense organs: Secondary | ICD-10-CM

## 2020-09-12 DIAGNOSIS — M0609 Rheumatoid arthritis without rheumatoid factor, multiple sites: Secondary | ICD-10-CM

## 2020-09-12 DIAGNOSIS — H40053 Ocular hypertension, bilateral: Secondary | ICD-10-CM

## 2020-09-12 DIAGNOSIS — N390 Urinary tract infection, site not specified: Secondary | ICD-10-CM

## 2020-09-12 DIAGNOSIS — Z86711 Personal history of pulmonary embolism: Secondary | ICD-10-CM

## 2020-09-12 NOTE — Patient Instructions (Signed)
Standing Labs We placed an order today for your standing lab work.   Please have your standing labs drawn in July and every 3 months  If possible, please have your labs drawn 2 weeks prior to your appointment so that the provider can discuss your results at your appointment.  We have open lab daily Monday through Thursday from 1:30-4:30 PM and Friday from 1:30-4:00 PM at the office of Dr. Ashur Glatfelter, [facility].   Please be advised, all patients with office appointments requiring lab work will take precedents over walk-in lab work.  If possible, please come for your lab work on Monday and Friday afternoons, as you may experience shorter wait times. The office is located at [address], [address], [place], [place] [address] No appointment is necessary.   Labs are drawn by [facility]. Please bring your co-pay at the time of your lab draw.  You may receive a bill from [facility] for your lab work.  If you wish to have your labs drawn at another location, please call the office 24 hours in advance to send orders.  If you have any questions regarding directions or hours of operation,  please call [phone].   As a reminder, please drink plenty of water prior to coming for your lab work. Thanks!  Vaccines You are taking a medication(s) that can suppress your immune system.  The following immunizations are recommended: . Flu annually . Covid-19  . Pneumonia (Pneumovax 23 and Prevnar 13 spaced at least 1 year apart) . Shingrix (after age 50)  Please check with your PCP to make sure you are up to date.  Heart Disease Prevention   Your inflammatory disease increases your risk of heart disease which includes heart attack, stroke, atrial fibrillation (irregular heartbeats), high blood pressure, heart failure and atherosclerosis (plaque in the arteries).  It is important to reduce your risk by:   . Keep blood pressure, cholesterol, and blood sugar at healthy levels   . Smoking  Cessation   . Maintain a healthy weight  o BMI 20-25   . Eat a healthy diet  o Plenty of fresh fruit, vegetables, and whole grains  o Limit saturated fats, foods high in sodium, and added sugars  o DASH and Mediterranean diet   . Increase physical activity  o Recommend moderate physically activity for 150 minutes per week/ 30 minutes a day for five days a week These can be broken up into three separate ten-minute sessions during the day.   . Reduce Stress  . Meditation, slow breathing exercises, yoga, coloring books  . Dental visits twice a year   

## 2020-09-13 ENCOUNTER — Other Ambulatory Visit: Payer: Self-pay | Admitting: Obstetrics and Gynecology

## 2020-09-13 ENCOUNTER — Ambulatory Visit (INDEPENDENT_AMBULATORY_CARE_PROVIDER_SITE_OTHER): Payer: BC Managed Care – PPO | Admitting: Psychology

## 2020-09-13 DIAGNOSIS — F411 Generalized anxiety disorder: Secondary | ICD-10-CM

## 2020-09-13 DIAGNOSIS — Z1231 Encounter for screening mammogram for malignant neoplasm of breast: Secondary | ICD-10-CM

## 2020-09-14 LAB — RHEUMATOID FACTOR: Rheumatoid fact SerPl-aCnc: 50 IU/mL — ABNORMAL HIGH (ref <14)

## 2020-09-14 LAB — PAN-ANCA
ANCA Screen: NEGATIVE
Myeloperoxidase Abs: <1 AI
Serine Protease 3: <1 AI

## 2020-09-14 LAB — CBC WITH DIFFERENTIAL/PLATELET
Absolute Monocytes: 827 cells/uL (ref 200–950)
Basophils Absolute: 85 cells/uL (ref 0–200)
Basophils Relative: 0.8 %
Eosinophils Absolute: 890 cells/uL — ABNORMAL HIGH (ref 15–500)
Eosinophils Relative: 8.4 %
HCT: 43.8 % (ref 35.0–45.0)
Hemoglobin: 14.2 g/dL (ref 11.7–15.5)
Lymphs Abs: 3169 cells/uL (ref 850–3900)
MCH: 27.4 pg (ref 27.0–33.0)
MCHC: 32.4 g/dL (ref 32.0–36.0)
MCV: 84.4 fL (ref 80.0–100.0)
MPV: 11.6 fL (ref 7.5–12.5)
Monocytes Relative: 7.8 %
Neutro Abs: 5629 cells/uL (ref 1500–7800)
Neutrophils Relative %: 53.1 %
Platelets: 335 10*3/uL (ref 140–400)
RBC: 5.19 10*6/uL — ABNORMAL HIGH (ref 3.80–5.10)
RDW: 12.8 % (ref 11.0–15.0)
Total Lymphocyte: 29.9 %
WBC: 10.6 10*3/uL (ref 3.8–10.8)

## 2020-09-14 LAB — QUANTIFERON-TB GOLD PLUS
Mitogen-NIL: 8.63 IU/mL
NIL: 0.03 IU/mL
QuantiFERON-TB Gold Plus: NEGATIVE
TB1-NIL: 0 IU/mL
TB2-NIL: <0 IU/mL

## 2020-09-14 LAB — SEDIMENTATION RATE: Sed Rate: 19 mm/h (ref 0–30)

## 2020-09-14 LAB — CYCLIC CITRUL PEPTIDE ANTIBODY, IGG: Cyclic Citrullin Peptide Ab: >250 UNITS — ABNORMAL HIGH

## 2020-09-14 NOTE — Progress Notes (Signed)
CBC is normal,  sed rate is normal, TB Gold is negative, test for vasculitis which was done done because of rash is negative.  The titers for rheumatoid arthritis and anti-CCP are stable.  Sedimentation rate is normal.

## 2020-09-19 ENCOUNTER — Ambulatory Visit: Payer: BC Managed Care – PPO | Admitting: Nurse Practitioner

## 2020-09-19 ENCOUNTER — Telehealth: Payer: Self-pay

## 2020-09-19 ENCOUNTER — Encounter: Payer: Self-pay | Admitting: Rheumatology

## 2020-09-19 MED ORDER — PREDNISONE 5 MG PO TABS: ORAL_TABLET | ORAL | 0 refills | Status: DC

## 2020-09-19 NOTE — Telephone Encounter (Signed)
Please call in prednisone 5 mg tablets, 2 tablets p.o. every morning for 4 days, 1-1/2 tablets for 4 days, 1 tablet for 4 days and 1/2 tablet for 4 Days.  She may resume methotrexate as her cough has resolved.

## 2020-09-19 NOTE — Telephone Encounter (Signed)
Patient called to confirm that her prescription of Prednisone is sent to [facility] at [address].

## 2020-09-19 NOTE — Telephone Encounter (Signed)
confirmed

## 2020-09-21 ENCOUNTER — Telehealth: Payer: Self-pay

## 2020-09-21 ENCOUNTER — Telehealth: Payer: Self-pay | Admitting: Family Medicine

## 2020-09-21 NOTE — Telephone Encounter (Signed)
Patient states she hugged her neighbor on Tuesday and the neighbor has now tested positive for COVID-19. Patient denies any COVID symptoms. Patient states neither her or her neighbor had a mask on but they were outdoors. Patient states she started taking prednisone yesterday and wanted to know if she should stop taking prednisone. I called Hazel Sams, PA-C and explained the situation and patient can continue prednisone as prescribed.

## 2020-09-21 NOTE — Telephone Encounter (Signed)
On call note: Called and spoke with patient who states she was outside with neighbor 3 days ago who today tested positive today for covid. Pt was outside with pt, neither person was masked. They were talking for under 5 min. Pt has no symptoms.   Immunization History  Administered Date(s) Administered  . Influenza,inj,Quad PF,6+ Mos 04/19/2013, 06/27/2015, 03/05/2016, 02/11/2017, 03/15/2018, 02/23/2019, 04/02/2020  . PFIZER(Purple Top)SARS-COV-2 Vaccination 08/06/2019, 08/25/2019, 01/12/2020  . PPD Test 07/21/2016  . Tdap 05/31/2013   She has RA. Pt has not taken methotrexate in 5 wks.   She will get PCR test tomorrow and if positive, will call on-call nurse line. If negative and asymptomatic, no further testing needed. Pt states rheum told her is she ever gets covid she would need monoclonal ab infusion. Reassured pt that her exposure was outdoors and limited to under 15 min so risk of transmission from neighbor to her is low

## 2020-09-24 ENCOUNTER — Telehealth: Payer: Self-pay | Admitting: Physician Assistant

## 2020-09-24 NOTE — Telephone Encounter (Addendum)
Patient called the on-call service yesterday afternoon regarding the prednisone prescription sent in on Friday, May 6th.  She states she took prednisone 10 mg the first day and then reduced to 5 mg daily.  She was concerned that she did not follow the taper as prescribed.  She feels that the 5 mg daily is working to manage her symptoms.  She was advised to continue on 5 mg for 1 week and then to reduce to 2.5 mg daily for 1 week.  She continues to hold MTX.  She is not exhibiting any signs or symptoms of covid-19 at this time. Covid-19 results pending.  She will notify us and her PCP with her results.   All questions were addressed.   Hazel Sams, PA-C

## 2020-09-25 ENCOUNTER — Ambulatory Visit (INDEPENDENT_AMBULATORY_CARE_PROVIDER_SITE_OTHER): Payer: BC Managed Care – PPO | Admitting: Psychology

## 2020-09-25 DIAGNOSIS — F411 Generalized anxiety disorder: Secondary | ICD-10-CM

## 2020-09-26 ENCOUNTER — Ambulatory Visit (INDEPENDENT_AMBULATORY_CARE_PROVIDER_SITE_OTHER): Payer: BC Managed Care – PPO | Admitting: General Practice

## 2020-09-26 ENCOUNTER — Other Ambulatory Visit: Payer: Self-pay

## 2020-09-26 DIAGNOSIS — R760 Raised antibody titer: Secondary | ICD-10-CM

## 2020-09-26 DIAGNOSIS — Z7901 Long term (current) use of anticoagulants: Secondary | ICD-10-CM

## 2020-09-26 LAB — POCT INR: INR: 3.4 — AB (ref 2.0–3.0)

## 2020-09-26 NOTE — Patient Instructions (Addendum)
Pre visit review using our clinic review tool, if applicable. No additional management support is needed unless otherwise documented below in the visit note.  Hold coumadin today and tomorrow (5/11 and 5/12) and then change dosage and take  2 tablets daily except 1 tablet on Sunday and Thursday and Fridays.   Re-check in  1 week.

## 2020-09-27 ENCOUNTER — Ambulatory Visit: Payer: BC Managed Care – PPO | Admitting: Psychology

## 2020-10-01 ENCOUNTER — Other Ambulatory Visit: Payer: Self-pay

## 2020-10-02 ENCOUNTER — Encounter: Payer: Self-pay | Admitting: Family Medicine

## 2020-10-02 ENCOUNTER — Ambulatory Visit (INDEPENDENT_AMBULATORY_CARE_PROVIDER_SITE_OTHER): Payer: BC Managed Care – PPO | Admitting: Family Medicine

## 2020-10-02 ENCOUNTER — Encounter: Payer: Self-pay | Admitting: Rheumatology

## 2020-10-02 VITALS — BP 110/76 | HR 73 | Temp 97.9°F | Wt 157.0 lb

## 2020-10-02 DIAGNOSIS — R350 Frequency of micturition: Secondary | ICD-10-CM | POA: Diagnosis not present

## 2020-10-02 LAB — POC URINALSYSI DIPSTICK (AUTOMATED)
Bilirubin, UA: NEGATIVE
Blood, UA: NEGATIVE
Glucose, UA: NEGATIVE
Ketones, UA: NEGATIVE
Leukocytes, UA: NEGATIVE
Nitrite, UA: NEGATIVE
Protein, UA: NEGATIVE
Spec Grav, UA: <=1.005 — AB (ref 1.010–1.025)
Urobilinogen, UA: 0.2 E.U./dL
pH, UA: 6 (ref 5.0–8.0)

## 2020-10-02 MED ORDER — PREDNISONE 5 MG PO TABS: ORAL_TABLET | ORAL | 0 refills | Status: DC

## 2020-10-02 NOTE — Telephone Encounter (Signed)
Please call in prednisone 5 mg p.o. daily for 2 weeks and 2.5 mg p.o. daily for the following 2 weeks then she may discontinue prednisone.  Please notify patient.

## 2020-10-02 NOTE — Progress Notes (Signed)
   Subjective:    Patient ID: [name], female    DOB: [birth year], 60 y.o.   MRN: [id number]  HPI Here for a possible UTI. On 08-21-20 she saw Dr. Regis Bill and a urine culture grew a pan-sensitive Citrobacter koseri. She was treated with 5 days of Macrobid, and she seemed to be over it. Now for the past 2 days she has had some increased frequency of urinatiions and her urine feels "hot" as it comes out. No burning or back pain or fever. She drinks plenty of water every day. She was recently started on a slow Prednisone taper by Dr. Estanislado Pandy, and currently she is taking 5 mg a day.    Review of Systems  Constitutional: Negative.   Respiratory: Negative.   Cardiovascular: Negative.   Genitourinary: Positive for frequency. Negative for dysuria, flank pain, hematuria and urgency.       Objective:   Physical Exam Constitutional:      Appearance: Normal appearance. She is not ill-appearing.  Cardiovascular:     Rate and Rhythm: Normal rate and regular rhythm.     Pulses: Normal pulses.     Heart sounds: Normal heart sounds.  Pulmonary:     Effort: Pulmonary effort is normal.     Breath sounds: Normal breath sounds.  Abdominal:     General: Abdomen is flat. Bowel sounds are normal. There is no distension.     Palpations: Abdomen is soft. There is no mass.     Tenderness: There is no abdominal tenderness. There is no right CVA tenderness, left CVA tenderness, guarding or rebound.     Hernia: No hernia is present.  Neurological:     Mental Status: She is alert.           Assessment & Plan:  She has some increased frequency of urination, and this is likely a side effect of the Prednisone. Her UA today is clear. We will send the sample for a culture however to be complete.  Alysia Penna, MD

## 2020-10-03 ENCOUNTER — Ambulatory Visit (INDEPENDENT_AMBULATORY_CARE_PROVIDER_SITE_OTHER): Payer: BC Managed Care – PPO | Admitting: General Practice

## 2020-10-03 ENCOUNTER — Other Ambulatory Visit: Payer: Self-pay

## 2020-10-03 DIAGNOSIS — R760 Raised antibody titer: Secondary | ICD-10-CM

## 2020-10-03 DIAGNOSIS — Z7901 Long term (current) use of anticoagulants: Secondary | ICD-10-CM

## 2020-10-03 LAB — POCT INR: INR: 1.9 — AB (ref 2.0–3.0)

## 2020-10-03 LAB — URINE CULTURE
MICRO NUMBER:: 11900019
SPECIMEN QUALITY:: ADEQUATE

## 2020-10-03 NOTE — Patient Instructions (Signed)
Pre visit review using our clinic review tool, if applicable. No additional management support is needed unless otherwise documented below in the visit note.  Change dosage and take 2.5 mg daily except 5 mg on Monday Wed and Fridays.   Re-check in 2 weeks.  **If you start antibiotic then take 2.5 mg daily until you finish it.  If no antibiotic then take 2.5 mg daily except 5 mg Monday Wed Friday.

## 2020-10-05 ENCOUNTER — Other Ambulatory Visit: Payer: Self-pay | Admitting: Rheumatology

## 2020-10-05 DIAGNOSIS — M0609 Rheumatoid arthritis without rheumatoid factor, multiple sites: Secondary | ICD-10-CM

## 2020-10-05 NOTE — Telephone Encounter (Signed)
Last Visit: 09/12/2020 Next Visit: 12/12/2020 Labs: 09/12/2020, CBC is normal, sed rate is normal, TB Gold is negative, test for vasculitis which was done done because of rash is negative. The titers for rheumatoid arthritis and anti-CCP are stable. Sedimentation rate is normal. Eye exam: 08/08/2020  Current Dose per office note 09/12/2020, Plaquenil 200 mg by mouth daily  DX:  Rheumatoid arthritis of multiple sites with negative rheumatoid factor   Last Fill: 07/09/2020  Okay to refill Plaquenil?

## 2020-10-11 ENCOUNTER — Ambulatory Visit: Payer: BC Managed Care – PPO | Admitting: Psychology

## 2020-10-16 ENCOUNTER — Encounter: Payer: Self-pay | Admitting: Rheumatology

## 2020-10-16 NOTE — Telephone Encounter (Signed)
Please advise patient it is okay to stay on prednisone 5 mg for another week and then she may start tapering prednisone as planned.

## 2020-10-17 ENCOUNTER — Other Ambulatory Visit: Payer: Self-pay

## 2020-10-17 ENCOUNTER — Other Ambulatory Visit: Payer: Self-pay | Admitting: General Practice

## 2020-10-17 ENCOUNTER — Ambulatory Visit (INDEPENDENT_AMBULATORY_CARE_PROVIDER_SITE_OTHER): Payer: BC Managed Care – PPO | Admitting: General Practice

## 2020-10-17 DIAGNOSIS — R760 Raised antibody titer: Secondary | ICD-10-CM

## 2020-10-17 DIAGNOSIS — Z7901 Long term (current) use of anticoagulants: Secondary | ICD-10-CM

## 2020-10-17 LAB — POCT INR: INR: 1.8 — AB (ref 2.0–3.0)

## 2020-10-17 MED ORDER — WARFARIN SODIUM 2.5 MG PO TABS: ORAL_TABLET | ORAL | 1 refills | Status: DC

## 2020-10-17 NOTE — Patient Instructions (Addendum)
Pre visit review using our clinic review tool, if applicable. No additional management support is needed unless otherwise documented below in the visit note.  Continue to take 2.5 mg daily except 5 mg on Monday Wed and Fridays. Re-check in 2 weeks.

## 2020-10-22 ENCOUNTER — Telehealth: Payer: Self-pay | Admitting: Rheumatology

## 2020-10-22 ENCOUNTER — Ambulatory Visit: Payer: BC Managed Care – PPO

## 2020-10-22 NOTE — Telephone Encounter (Signed)
Patient has peridental disease, and needs treatment. Patient is concerned with COVID, and coming in. Doctor needs to know if they need to give her an antibiotic premed, or post med after deep cleanings to possible easy patient's mind in reference to COVID? Not sure how that will work with her Rheumatoid medication etc.  Please call to discuss

## 2020-10-23 NOTE — Telephone Encounter (Signed)
Antibiotic prophylaxis is not required for the patient with rheumatoid arthritis on immunosuppressive therapy.  However, I will leave it up to the dentist's discretion.  As far as the risk of COVID-19 exposure is concerned their office should take all the precautions.

## 2020-10-23 NOTE — Telephone Encounter (Signed)
[name] asked Arestin (clindamycin) powder placed at the site of infection. This stays present for 60-90 days.  Would this affect how she takes her MTX. [name] also asked if she is okay for deep cleanings with her immunosuppression. Please advise.

## 2020-10-24 NOTE — Telephone Encounter (Signed)
Spoke with Roselyn Reef and advised If patient has no active infection then she can stay on methotrexate.  Advised Dr. Estanislado Pandy does not know much about deep cleaning. Advised that will be a decision her dentist should make.  If there is a risk of infection then he may hold methotrexate. Roselyn Reef expressed understanding and states she will call back if they have any further questions.

## 2020-10-24 NOTE — Telephone Encounter (Signed)
If patient has no active infection then she can stay on methotrexate.  I do not know much about deep cleaning.  That will be a decision her dentist would like to make.  If there is a risk of infection then he may hold methotrexate.

## 2020-10-25 ENCOUNTER — Institutional Professional Consult (permissible substitution): Payer: BC Managed Care – PPO | Admitting: Internal Medicine

## 2020-10-25 ENCOUNTER — Ambulatory Visit: Payer: BC Managed Care – PPO | Admitting: Psychology

## 2020-10-29 ENCOUNTER — Ambulatory Visit: Payer: BC Managed Care – PPO | Admitting: Internal Medicine

## 2020-10-29 ENCOUNTER — Encounter: Payer: Self-pay | Admitting: Rheumatology

## 2020-10-29 ENCOUNTER — Encounter: Payer: Self-pay | Admitting: Internal Medicine

## 2020-10-29 ENCOUNTER — Other Ambulatory Visit: Payer: Self-pay

## 2020-10-29 VITALS — BP 110/60 | HR 54 | Temp 97.6°F | Ht 63.5 in | Wt 157.6 lb

## 2020-10-29 DIAGNOSIS — Z79899 Other long term (current) drug therapy: Secondary | ICD-10-CM

## 2020-10-29 DIAGNOSIS — R399 Unspecified symptoms and signs involving the genitourinary system: Secondary | ICD-10-CM

## 2020-10-29 DIAGNOSIS — Z789 Other specified health status: Secondary | ICD-10-CM

## 2020-10-29 DIAGNOSIS — R3989 Other symptoms and signs involving the genitourinary system: Secondary | ICD-10-CM

## 2020-10-29 DIAGNOSIS — Z7901 Long term (current) use of anticoagulants: Secondary | ICD-10-CM

## 2020-10-29 DIAGNOSIS — M0609 Rheumatoid arthritis without rheumatoid factor, multiple sites: Secondary | ICD-10-CM

## 2020-10-29 DIAGNOSIS — Z87440 Personal history of urinary (tract) infections: Secondary | ICD-10-CM | POA: Diagnosis not present

## 2020-10-29 DIAGNOSIS — Z7952 Long term (current) use of systemic steroids: Secondary | ICD-10-CM

## 2020-10-29 LAB — POC URINALSYSI DIPSTICK (AUTOMATED)
Bilirubin, UA: NEGATIVE
Blood, UA: POSITIVE
Glucose, UA: NEGATIVE
Ketones, UA: NEGATIVE
Nitrite, UA: NEGATIVE
Protein, UA: NEGATIVE
Spec Grav, UA: 1.01 (ref 1.010–1.025)
Urobilinogen, UA: 0.2 E.U./dL
pH, UA: 6 (ref 5.0–8.0)

## 2020-10-29 MED ORDER — PHENAZOPYRIDINE HCL 100 MG PO TABS: 100.0000 mg | ORAL_TABLET | Freq: Three times a day (TID) | ORAL | 0 refills | Status: DC | PRN

## 2020-10-29 NOTE — Patient Instructions (Signed)
Begin the ci\cefdinir. Awaiting  culture results.  If needed we can switch antibiotic Would advise to have urologist involved with  long term plans. Will send results to dr Junious Silk team.   Will place a standing order. For ua and  urine culture  here at [facility] lab.  so can call with symptoms and get UA  in th future.

## 2020-10-29 NOTE — Progress Notes (Signed)
Chief Complaint  Patient presents with   Urinary Tract Infection    Patient complains of UTI, x3 days ago, Patient reports pain, Tried drinking fluids     HPI: [name] 60 y.o. come in for suspicion of another UTI SDA appointment   Mild sx 3-4 days ago and was worse over weekend  end urinary pain on voiding had a prescription for cefdinir that she got filled but has not taken yet.  Has a urologist but has a follow-up in August but is concerned of the large waiting area with many people and potential exposure to COVID with her conditions.  So she comes through our office. Last u cx was  5 17  mixed flora not cw uti  Last documented in system  was citrobacter  pansensitive April 2022 rx with macrobid early successfully Currently had pred burst for ra flare  end of may and is on 2.5 mg.  She is stop the methotrexate with the UTI symptoms No fever sweats systemic symptoms otherwise brings up the possibility of having a Cipro prescription on hand which was previously discouraged by her previous urologist. In the past it had "worked well" There is no active bleeding.  No specific vaginal symptoms like a yeast infection ROS: See pertinent positives and negatives per HPI.  Past Medical History:  Diagnosis Date   Adjustment disorder with anxiety    B12 deficiency due to diet    is a vegetarian   Chest pain, unspecified    Chronic anticoagulation    Fatty liver    Per patient   Headache(784.0)    migraines and head pain   Hx of varicella    As Child   Iron deficiency anemia, unspecified    Otalgia, unspecified    Other and unspecified hyperlipidemia    Palpitations    Personal history of venous thrombosis and embolism    Recurrent UTI    Rheumatoid arthritis(714.0)    deveshewar   Unspecified vitamin D deficiency    UTI (lower urinary tract infection) 11/28/2011   citrobacter       Family History  Problem Relation Age of Onset   Dementia Father    Stroke Father     Hyperlipidemia Father    Diabetes Father    Parkinsonism Mother    Lymphoma Other    Colon cancer Neg Hx     Social History   Socioeconomic History   Marital status: Married    Spouse name: Not on file   Number of children: 1   Years of education: Not on file   Highest education level: Not on file  Occupational History   Occupation: home maker  Tobacco Use   Smoking status: Never   Smokeless tobacco: Never  Vaping Use   Vaping Use: Never used  Substance and Sexual Activity   Alcohol use: No   Drug use: Never   Sexual activity: Not on file  Other Topics Concern   Not on file  Social History Narrative   Regular Exercise-no   20 yrs in [place]   Son at [employer]   From [place].   Married [place]  of 3    G1P1            Social Determinants of Health   Financial Resource Strain: Not on file  Food Insecurity: Not on file  Transportation Needs: Not on file  Physical Activity: Not on file  Stress: Not on file  Social Connections: Not on file  Outpatient Medications Prior to Visit  Medication Sig Dispense Refill   alendronate (FOSAMAX) 70 MG tablet Take 1 tablet (70 mg total) by mouth once a week. Take with a full glass of water on an empty stomach. 4 tablet 2   ALPRAZolam (XANAX) 0.25 MG tablet Take 1 tablet (0.25 mg total) by mouth 2 (two) times daily as needed for anxiety. 30 tablet 0   Calcium Carb-Cholecalciferol 600-800 MG-UNIT TABS Take by mouth.     Cholecalciferol (VITAMIN D3) 2000 UNITS TABS Take 1 tablet by mouth daily.     cycloSPORINE (RESTASIS) 0.05 % ophthalmic emulsion Place 1 drop into both eyes daily.     Eflornithine HCl 13.9 % cream Apply topically daily.      folic acid (FOLVITE) 1 MG tablet TAKE 2 TABLETS(2 MG) BY MOUTH DAILY 180 tablet 3   hydroxychloroquine (PLAQUENIL) 200 MG tablet TAKE 1 TABLET(200 MG) BY MOUTH DAILY 90 tablet 0   methotrexate (RHEUMATREX) 2.5 MG tablet TAKE 8 TABLETS BY MOUTH ONCE A WEEK 96 tablet 0   mineral/vitamin supplement  (MULTIGEN) 70 MG TABS tablet TAKE 1 TABLET BY MOUTH DAILY 90 tablet 3   omeprazole (PRILOSEC) 20 MG capsule Take 1 capsule (20 mg total) by mouth as needed. 30 capsule 3   predniSONE (DELTASONE) 5 MG tablet Take 5 mg tab p.o. daily for 2 weeks, then 2.5 mg tab p.o. daily for 2 weeks, then discontinue prednisone 21 tablet 0   SUMAtriptan (IMITREX) 50 MG tablet TAKE ONE TABLET BY MOUTH AS NEEDED. MAY REPEAT IN 2 TO 4 HOURS IF NEEDED. 9 tablet 5   warfarin (COUMADIN) 2.5 MG tablet TAKE 2 TABLETS BY MOUTH DAILY, EXCEPT ON MONDAY AND THURSDAY TAKE 1 TABLET. OR AS DIRECTED BY ANTICOAGULATION CLINIC 180 tablet 1   No facility-administered medications prior to visit.     EXAM:  BP 110/60 (BP Location: Left Arm, Patient Position: Sitting, Cuff Size: Normal)   Pulse (!) 54   Temp 97.6 F (36.4 C) (Oral)   Ht 5' 3.5" (1.613 m)   Wt 157 lb 9.6 oz (71.5 kg)   SpO2 94%   BMI 27.48 kg/m   Body mass index is 27.48 kg/m.  GENERAL: vitals reviewed and listed above, alert, oriented, appears well hydrated and in no acute distress HEENT: atraumatic, conjunctiva  clear, no obvious abnormalities on inspection of external nose and ears OP : Masked NECK: no obvious masses on inspection palpation  No respiratory symptoms CV:no clubbing cyanosis or  peripheral edema nl cap refill  MS: moves all extremities without noticeable focal  abnormality PSYCH: pleasant and cooperative, no obvious depression or anxiety Lab Results  Component Value Date   WBC 10.6 09/12/2020   HGB 14.2 09/12/2020   HCT 43.8 09/12/2020   PLT 335 09/12/2020   GLUCOSE 88 08/29/2020   CHOL 194 09/13/2019   TRIG 139.0 09/13/2019   HDL 66.20 09/13/2019   LDLDIRECT 97.3 05/25/2013   LDLCALC 100 (H) 09/13/2019   ALT 24 08/29/2020   AST 28 08/29/2020   NA 140 08/29/2020   K 4.2 08/29/2020   CL 105 08/29/2020   CREATININE 0.83 08/29/2020   BUN 12 08/29/2020   CO2 27 08/29/2020   TSH 3.21 09/13/2019   INR 1.8 (A) 10/17/2020    HGBA1C 6.0 07/20/2018   BP Readings from Last 3 Encounters:  10/29/20 110/60  10/02/20 110/76  09/12/20 104/66   UA positive for blood and leukocytes negative nitrites ASSESSMENT AND PLAN:  Discussed the following  assessment and plan:  Suspected UTI  History of recurrent UTI (urinary tract infection) - Plan: POCT Urinalysis Dipstick (Automated), Urine Culture, POCT Urinalysis Dipstick (Automated), Culture, Urine, CANCELED: Culture, Urine, CANCELED: Culture, Urine  Long term (current) use of anticoagulants  Medication management  Lower urinary tract symptoms (LUTS) - Plan: POCT Urinalysis Dipstick (Automated), Culture, Urine  Rheumatoid arthritis of multiple sites with negative rheumatoid factor ([place])  Medically complex   Current use of steroid medication Expect  another lower tract UTI discussed disc of resistance with same antibiotics risk-benefit of a given antibiotic she already had the cefdinir filled she can pick it up and take that 1 culture pending Discourage Cipro for this infection today and an open prescription would like to be directed However because of days in the system I am okay writing for a standing order for urinalysis and culture when she has a symptom and then contact me for help if she is not with the urologist. Would rather have urology follow-up for any kind of long-term plan and prevention for prefilled antibiotic use. Will send information when available to Dr. Lyndal Rainbow team.   Is holding her methotrexate seems okay to stay on the low-dose prednisone until finished based on her symptom complex today. 35 minutes counsel review and plan  -Patient advised to return or notify health care team  if  new concerns arise.  Patient Instructions  Begin the ci\cefdinir. Awaiting  culture results.  If needed we can switch antibiotic Would advise to have urologist involved with  long term plans. Will send results to dr Junious Silk team.   Will place a standing  order. For ua and  urine culture  here at [facility] lab.  so can call with symptoms and get UA  in th future.    Standley Brooking. Rual Vermeer M.D.

## 2020-10-29 NOTE — Telephone Encounter (Signed)
Patient stopped methotrexate.  She may continue Plaquenil and prednisone taper as prescribed.

## 2020-10-30 ENCOUNTER — Ambulatory Visit (INDEPENDENT_AMBULATORY_CARE_PROVIDER_SITE_OTHER): Payer: BC Managed Care – PPO | Admitting: Psychology

## 2020-10-30 DIAGNOSIS — F411 Generalized anxiety disorder: Secondary | ICD-10-CM | POA: Diagnosis not present

## 2020-10-31 ENCOUNTER — Other Ambulatory Visit: Payer: Self-pay

## 2020-10-31 ENCOUNTER — Ambulatory Visit (INDEPENDENT_AMBULATORY_CARE_PROVIDER_SITE_OTHER): Payer: BC Managed Care – PPO | Admitting: General Practice

## 2020-10-31 DIAGNOSIS — R760 Raised antibody titer: Secondary | ICD-10-CM | POA: Diagnosis not present

## 2020-10-31 DIAGNOSIS — Z7901 Long term (current) use of anticoagulants: Secondary | ICD-10-CM

## 2020-10-31 LAB — URINE CULTURE
MICRO NUMBER:: 11999908
SPECIMEN QUALITY:: ADEQUATE

## 2020-10-31 LAB — POCT INR: INR: 1.1 — AB (ref 2.0–3.0)

## 2020-10-31 NOTE — Patient Instructions (Addendum)
Pre visit review using our clinic review tool, if applicable. No additional management support is needed unless otherwise documented below in the visit note.  Take 3 tablets today and then change dosage back to 5 mg daily except 2.5 mg on Sundays and Thursdays.  Re-check on Monday.

## 2020-10-31 NOTE — Progress Notes (Signed)
urine culture shows citrobacter  sensitive to medication given . Should resolve with current treatment . Forwarding info  to Dr Junious Silk

## 2020-11-01 MED ORDER — CEFDINIR 300 MG PO CAPS: 300.0000 mg | ORAL_CAPSULE | Freq: Two times a day (BID) | ORAL | 0 refills | Status: DC

## 2020-11-01 NOTE — Telephone Encounter (Signed)
Cefdinir 300 mg sent to patients pharmacy.

## 2020-11-01 NOTE — Telephone Encounter (Signed)
Please send to her pharmacy  2 more days of cefdinir 300 mg 1 p.o. twice daily dispense #6 so that she has a full 7-day treatment.

## 2020-11-02 ENCOUNTER — Ambulatory Visit
Admission: RE | Admit: 2020-11-02 | Discharge: 2020-11-02 | Disposition: A | Discharge status: Home / Self Care | Payer: BC Managed Care – PPO | Source: Ambulatory Visit | Attending: Obstetrics and Gynecology | Admitting: Obstetrics and Gynecology

## 2020-11-02 ENCOUNTER — Other Ambulatory Visit: Payer: Self-pay

## 2020-11-02 DIAGNOSIS — Z1231 Encounter for screening mammogram for malignant neoplasm of breast: Secondary | ICD-10-CM

## 2020-11-05 ENCOUNTER — Ambulatory Visit: Payer: BC Managed Care – PPO

## 2020-11-06 ENCOUNTER — Encounter: Payer: Self-pay | Admitting: Rheumatology

## 2020-11-08 ENCOUNTER — Ambulatory Visit (INDEPENDENT_AMBULATORY_CARE_PROVIDER_SITE_OTHER): Payer: BC Managed Care – PPO | Admitting: Psychology

## 2020-11-08 DIAGNOSIS — F411 Generalized anxiety disorder: Secondary | ICD-10-CM | POA: Diagnosis not present

## 2020-11-22 ENCOUNTER — Institutional Professional Consult (permissible substitution): Payer: BC Managed Care – PPO | Admitting: Internal Medicine

## 2020-11-28 ENCOUNTER — Ambulatory Visit (INDEPENDENT_AMBULATORY_CARE_PROVIDER_SITE_OTHER): Payer: BC Managed Care – PPO | Admitting: General Practice

## 2020-11-28 ENCOUNTER — Other Ambulatory Visit: Payer: Self-pay

## 2020-11-28 DIAGNOSIS — R760 Raised antibody titer: Secondary | ICD-10-CM

## 2020-11-28 DIAGNOSIS — Z7901 Long term (current) use of anticoagulants: Secondary | ICD-10-CM

## 2020-11-28 LAB — POCT INR: INR: 2 (ref 2.0–3.0)

## 2020-11-28 NOTE — Progress Notes (Signed)
Office Visit Note  Patient: [name]             Date of Birth: 1961/05/11           MRN: [id number]             PCP: Burnis Medin, MD Referring: Burnis Medin, MD Visit Date: 12/12/2020 Occupation: @GUAROCC @  Subjective:  Increased pain in joints.   History of Present Illness: [name] is a 60 y.o. female with a history of rheumatoid arthritis and osteoarthritis.  She states she has to frequently come off methotrexate due to urinary tract infections.  She recently came off methotrexate as she also wanted to COVID-19 booster.  She had a booster about 4 weeks ago.  She states she started having pain and swelling in multiple joints.  She describes pain and discomfort in her bilateral shoulders, bilateral elbows and bilateral hands.  She states her hands have been swelling and she is having difficulty making a fist.  She also has some discomfort in her knee joints and her feet.  Activities of Daily Living:  Patient reports morning stiffness for 24 hours.   Patient Reports nocturnal pain.  Difficulty dressing/grooming: Denies Difficulty climbing stairs: Denies Difficulty getting out of chair: Denies Difficulty using hands for taps, buttons, cutlery, and/or writing: Reports  Review of Systems  Constitutional:  Positive for fatigue.  HENT:  Positive for mouth dryness and nose dryness. Negative for mouth sores.   Eyes:  Positive for dryness. Negative for pain, itching and visual disturbance.  Respiratory:  Positive for cough. Negative for hemoptysis, shortness of breath and difficulty breathing.   Cardiovascular:  Negative for chest pain, palpitations and swelling in legs/feet.  Gastrointestinal:  Negative for abdominal pain, blood in stool, constipation and diarrhea.  Endocrine: Negative for increased urination.  Genitourinary:  Negative for painful urination.  Musculoskeletal:  Positive for joint pain, joint pain, joint swelling, myalgias, muscle weakness,  morning stiffness, muscle tenderness and myalgias.  Skin:  Negative for color change, rash and redness.  Allergic/Immunologic: Positive for susceptible to infections.  Neurological:  Negative for dizziness, numbness, headaches, memory loss and weakness.  Hematological:  Negative for swollen glands.  Psychiatric/Behavioral:  Positive for sleep disturbance. Negative for confusion.    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[place]) 01/06/2018   Long term (current) use of anticoagulants 02/11/2017   Sicca syndrome ([place]) 12/22/2016   ANA positive 12/22/2016   Raised intraocular pressure of both eyes 12/22/2016   Migraine 03/31/2016   Anxiety 03/31/2016   High risk medication use 03/31/2016   Primary osteoarthritis of both feet 03/31/2016   Primary osteoarthritis of both knees 03/31/2016   Stiffness of left hand joint 11/27/2014   Elevated IOP 06/09/2014   Long term current use of systemic steroids 06/09/2014   Medically complex patient 06/09/2014   Current use of steroid medication 01/09/2014   Xerosis of skin 12/07/2013   Encounter for therapeutic drug monitoring 06/09/2013   B12 deficiency 05/31/2013   Iron deficiency 05/31/2013   Visit for preventive health examination 05/25/2012   Mammogram abnormal 04/12/2012   Iliac crest  pain 01/23/2012   Vaginal atrophy 01/23/2012   History of recurrent UTI (urinary tract infection) 01/23/2012   Osteoporosis 04/29/2011   Nonspecific abnormal results of liver function study 12/08/2010   Chronic anticoagulation    Anticoagulant long-term use 07/15/2010   Anticardiolipin antibody positive 07/15/2010   Chronic  cough 03/19/2009   CHEST PAIN-UNSPECIFIED 08/26/2008   PALPITATIONS, RECURRENT 07/24/2008   Vitamin D deficiency 05/30/2008   HYPERLIPIDEMIA 05/30/2008   IRON DEFICIENCY 05/30/2008   Adjustment disorder with anxiety 05/30/2008   Rheumatoid arthritis ([place]) 05/30/2008    PULMONARY EMBOLISM, HX OF 05/30/2008    Past Medical History:  Diagnosis Date   Adjustment disorder with anxiety    B12 deficiency due to diet    is a vegetarian   Chest pain, unspecified    Chronic anticoagulation    Fatty liver    Per patient   Headache(784.0)    migraines and head pain   Hx of varicella    As Child   Iron deficiency anemia, unspecified    Otalgia, unspecified    Other and unspecified hyperlipidemia    Palpitations    Personal history of venous thrombosis and embolism    Recurrent UTI    Rheumatoid arthritis(714.0)    deveshewar   Unspecified vitamin D deficiency    UTI (lower urinary tract infection) 11/28/2011   citrobacter       Family History  Problem Relation Age of Onset   Dementia Father    Stroke Father    Hyperlipidemia Father    Diabetes Father    Parkinsonism Mother    Lymphoma Other    Colon cancer Neg Hx    Past Surgical History:  Procedure Laterality Date   ORIF ULNAR FRACTURE     TYMPANOSTOMY TUBE PLACEMENT  2009   Right   Social History   Social History Narrative   Regular Exercise-no   20 yrs in [place]   Son at [employer]   From [place].   Married [place]  of 3    G1P1            Immunization History  Administered Date(s) Administered   Influenza,inj,Quad PF,6+ Mos 04/19/2013, 06/27/2015, 03/05/2016, 02/11/2017, 03/15/2018, 02/23/2019, 04/02/2020   PFIZER(Purple Top)SARS-COV-2 Vaccination 08/06/2019, 08/25/2019, 01/12/2020, 11/10/2020   PPD Test 07/21/2016   Tdap 05/31/2013     Objective: Vital Signs: BP 110/73 (BP Location: Right Arm, Patient Position: Sitting, Cuff Size: Normal)   Pulse (!) 59   Ht 5' 3.5" (1.613 m)   Wt 156 lb 3.2 oz (70.9 kg)   BMI 27.24 kg/m    Physical Exam Vitals and nursing note reviewed.  Constitutional:      Appearance: She is well-developed.  HENT:     Head: Normocephalic and atraumatic.  Eyes:     Conjunctiva/sclera: Conjunctivae normal.  Cardiovascular:     Rate and Rhythm: Normal rate  and regular rhythm.     Heart sounds: Normal heart sounds.  Pulmonary:     Effort: Pulmonary effort is normal.     Breath sounds: Normal breath sounds.  Abdominal:     General: Bowel sounds are normal.     Palpations: Abdomen is soft.  Musculoskeletal:     Cervical back: Normal range of motion.  Lymphadenopathy:     Cervical: No cervical adenopathy.  Skin:    General: Skin is warm and dry.     Capillary Refill: Capillary refill takes less than 2 seconds.  Neurological:     Mental Status: She is alert and oriented to person, place, and time.  Psychiatric:        Behavior: Behavior normal.     Musculoskeletal Exam: C-spine was in good range of motion.  Shoulder joints, elbow joints, wrist joints were in good range of motion.  She had discomfort range of  motion of her bilateral shoulders.  She had synovitis in several of her MCPs as described below.  She has discomfort range of motion of her knee joints without warmth swelling or effusion.  There is no tenderness over ankles or MTPs.  CDAI Exam: CDAI Score: 19  Patient Global: 5 mm; Provider Global: 5 mm Swollen: 6 ; Tender: 12  Joint Exam 12/12/2020      Right  Left  Glenohumeral   Tender   Tender  Elbow   Tender   Tender  MCP 2  Swollen Tender  Swollen Tender  MCP 3  Swollen Tender  Swollen Tender  MCP 5  Swollen Tender  Swollen Tender  Knee   Tender   Tender     Investigation: No additional findings.  Imaging: No results found.  Recent Labs: Lab Results  Component Value Date   WBC 10.6 09/12/2020   HGB 14.2 09/12/2020   PLT 335 09/12/2020   NA 140 08/29/2020   K 4.2 08/29/2020   CL 105 08/29/2020   CO2 27 08/29/2020   GLUCOSE 88 08/29/2020   BUN 12 08/29/2020   CREATININE 0.83 08/29/2020   BILITOT 0.5 08/29/2020   ALKPHOS 60 08/29/2020   AST 28 08/29/2020   ALT 24 08/29/2020   PROT 7.1 08/29/2020   ALBUMIN 3.7 08/29/2020   CALCIUM 9.2 08/29/2020   GFRAA 96 07/26/2020   QFTBGOLDPLUS NEGATIVE 09/12/2020     Speciality Comments: PLQ Eye Exam: 08/08/2020 WNL @ [facility] Follow up 6 months  Procedures:  No procedures performed Allergies: Boniva [ibandronate sodium], Ivp dye [iodinated diagnostic agents], Other, Risedronate sodium, Risedronate sodium [risedronate sodium], and Macrobid [nitrofurantoin]   Assessment / Plan:     Visit Diagnoses: Rheumatoid arthritis of multiple sites with negative rheumatoid factor (HCC) - RF negative, anti-CCP positive, ANA positive  -she has been experiencing increased pain and swelling in her joints.  She is having a flare today with synovitis in multiple joints.  She is not able to take methotrexate on a regular basis due to frequent urinary tract infections.  I am hesitant to give her biologic agent because of frequent UTIs.  She may need prophylaxis for UTI.  As she was having a severe flare I will start her on prednisone.  She has intolerance to higher dose of prednisone.  I will place her on prednisone 10 mg p.o. daily and taper by 2.5 mg every 4 days until she reaches 5 mg p.o. daily.  Plan: Sedimentation rate  High risk medication use - Plaquenil 200 mg by mouth daily, Methotrexate 8 tablets by mouth every week, and folic acid 2 mg by mouth daily.PLQ Eye Exam: 08/08/2020 - Plan: CBC with Differential/Platelet, COMPLETE METABOLIC PANEL WITH GFR, Hydroxychloroquine, Blood today.  She will need labs every 3 months to monitor for drug toxicity.  She already received a booster for COVID-19.  Immunization recommendations were also placed in the AVS.  She has been advised to stop methotrexate in case she develops an infection and resume after the infection resolves.  Sicca syndrome (HCC)-over-the-counter products were discussed.  Pain in both hands-she has osteoarthritis and rheumatoid arthritis overlap with increased pain and swelling in her bilateral hands.  Primary osteoarthritis of both knees-she continues to have some discomfort in her knee joints.  She  has been having increased pain since she is having rheumatoid arthritis flare.  Primary osteoarthritis of both feet-she complains of increased discomfort in her feet.  No warmth swelling or effusion was noted.  Age related osteoporosis, unspecified pathological fracture presence - 02/02/20 T-score DualFemur Neck Left  02/02/2020 T score  -2.8, BMD 0.649 g/cm2. alendronate 70 mg p.o. weekly.  Use of calcium rich diet with vitamin D was discussed.  Raised intraocular pressure of both eyes  History of pulmonary embolism - she is on long-term Coumadin.  History of hyperlipidemia  Fatty liver-LFTs have been stable.  History of anemia  History of migraine  Frequent UTI-have advised her to discuss possible use of prophylactic antibiotics with her urologist so she can take methotrexate on a regular basis.  Rheumatoid arthritis of multiple sites with negative rheumatoid factor ([place]) - Plan: Sedimentation rate  Orders: Orders Placed This Encounter  Procedures   CBC with Differential/Platelet   COMPLETE METABOLIC PANEL WITH GFR   Hydroxychloroquine, Blood   Sedimentation rate    Meds ordered this encounter  Medications   hydroxychloroquine (PLAQUENIL) 200 MG tablet    Sig: TAKE 1 TABLET(200 MG) BY MOUTH DAILY    Dispense:  90 tablet    Refill:  0   methotrexate (RHEUMATREX) 2.5 MG tablet    Sig: Take 8 tablets (20 mg total) by mouth once a week. Caution:Chemotherapy. Protect from light.    Dispense:  96 tablet    Refill:  0   predniSONE (DELTASONE) 5 MG tablet    Sig: Take 2 tabs po qd x 4 days, take 1.5 tabs po qd x 4 days, take 1 tab po qd x 4 days, then take 1/2 tab po qd x 4 days.    Dispense:  20 tablet    Refill:  0      Follow-Up Instructions: Return in about 3 months (around 03/14/2021) for Rheumatoid arthritis.   Bo Merino, MD  Note - This record has been created using Editor, commissioning.  Chart creation errors have been sought, but may not always  have been  located. Such creation errors do not reflect on  the standard of medical care.

## 2020-11-28 NOTE — Patient Instructions (Addendum)
Pre visit review using our clinic review tool, if applicable. No additional management support is needed unless otherwise documented below in the visit note.  Continue to take 5 mg daily except 2.5 mg on Sundays and Thursdays.  Re-check in 6 weeks.

## 2020-11-30 ENCOUNTER — Encounter: Payer: Self-pay | Admitting: Rheumatology

## 2020-11-30 MED ORDER — PREDNISONE 5 MG PO TABS: ORAL_TABLET | ORAL | 0 refills | Status: DC

## 2020-11-30 NOTE — Telephone Encounter (Signed)
Ok to send in prednisone 5 mg daily x 1 week and then 2.5 mg daily x1 week, then discontinue.

## 2020-12-06 ENCOUNTER — Ambulatory Visit (INDEPENDENT_AMBULATORY_CARE_PROVIDER_SITE_OTHER): Payer: BC Managed Care – PPO | Admitting: Psychology

## 2020-12-06 DIAGNOSIS — F411 Generalized anxiety disorder: Secondary | ICD-10-CM | POA: Diagnosis not present

## 2020-12-07 ENCOUNTER — Telehealth: Payer: Self-pay

## 2020-12-07 ENCOUNTER — Encounter: Payer: Self-pay | Admitting: *Deleted

## 2020-12-07 NOTE — Telephone Encounter (Signed)
Attempted to contact the patient and left message for patient to call the office.  

## 2020-12-07 NOTE — Telephone Encounter (Signed)
ok 

## 2020-12-07 NOTE — Telephone Encounter (Signed)
Patient called requesting an updated letter for her husband to work from home.  Patient states the letter should include her name, that she has a compromised immune system, and under the care of Dr. Estanislado Pandy.  Patient states the letter will allow her husband to continue working from home so that he will not bring home the virus.  Patient states she has an appointment on Wednesday, 7/27 and can pick up the letter then.

## 2020-12-07 NOTE — Telephone Encounter (Signed)
Letter written for patient and placed at front desk o be picked up.

## 2020-12-12 ENCOUNTER — Other Ambulatory Visit: Payer: Self-pay

## 2020-12-12 ENCOUNTER — Ambulatory Visit (INDEPENDENT_AMBULATORY_CARE_PROVIDER_SITE_OTHER): Payer: BC Managed Care – PPO | Admitting: Rheumatology

## 2020-12-12 ENCOUNTER — Encounter: Payer: Self-pay | Admitting: Rheumatology

## 2020-12-12 VITALS — BP 110/73 | HR 59 | Ht 63.5 in | Wt 156.2 lb

## 2020-12-12 DIAGNOSIS — Z8639 Personal history of other endocrine, nutritional and metabolic disease: Secondary | ICD-10-CM

## 2020-12-12 DIAGNOSIS — K760 Fatty (change of) liver, not elsewhere classified: Secondary | ICD-10-CM

## 2020-12-12 DIAGNOSIS — M0609 Rheumatoid arthritis without rheumatoid factor, multiple sites: Secondary | ICD-10-CM

## 2020-12-12 DIAGNOSIS — M3500 Sicca syndrome, unspecified: Secondary | ICD-10-CM | POA: Diagnosis not present

## 2020-12-12 DIAGNOSIS — M79642 Pain in left hand: Secondary | ICD-10-CM

## 2020-12-12 DIAGNOSIS — M810 Age-related osteoporosis without current pathological fracture: Secondary | ICD-10-CM

## 2020-12-12 DIAGNOSIS — R21 Rash and other nonspecific skin eruption: Secondary | ICD-10-CM

## 2020-12-12 DIAGNOSIS — N390 Urinary tract infection, site not specified: Secondary | ICD-10-CM

## 2020-12-12 DIAGNOSIS — Z8669 Personal history of other diseases of the nervous system and sense organs: Secondary | ICD-10-CM

## 2020-12-12 DIAGNOSIS — H40053 Ocular hypertension, bilateral: Secondary | ICD-10-CM

## 2020-12-12 DIAGNOSIS — Z79899 Other long term (current) drug therapy: Secondary | ICD-10-CM

## 2020-12-12 DIAGNOSIS — M19071 Primary osteoarthritis, right ankle and foot: Secondary | ICD-10-CM

## 2020-12-12 DIAGNOSIS — Z862 Personal history of diseases of the blood and blood-forming organs and certain disorders involving the immune mechanism: Secondary | ICD-10-CM

## 2020-12-12 DIAGNOSIS — M79641 Pain in right hand: Secondary | ICD-10-CM

## 2020-12-12 DIAGNOSIS — M19072 Primary osteoarthritis, left ankle and foot: Secondary | ICD-10-CM

## 2020-12-12 DIAGNOSIS — Z86711 Personal history of pulmonary embolism: Secondary | ICD-10-CM

## 2020-12-12 DIAGNOSIS — M170 Bilateral primary osteoarthritis of knee: Secondary | ICD-10-CM

## 2020-12-12 MED ORDER — METHOTREXATE 2.5 MG PO TABS: 20.0000 mg | ORAL_TABLET | ORAL | 0 refills | Status: DC

## 2020-12-12 MED ORDER — HYDROXYCHLOROQUINE SULFATE 200 MG PO TABS: ORAL_TABLET | ORAL | 0 refills | Status: DC

## 2020-12-12 MED ORDER — PREDNISONE 5 MG PO TABS: ORAL_TABLET | ORAL | 0 refills | Status: DC

## 2020-12-12 NOTE — Patient Instructions (Addendum)
Start prednisone 10 mg by mouth daily and decrease by 2.5 mg every 4 days.  Stay on prednisone 5 mg daily.   Standing Labs We placed an order today for your standing lab work.   Please have your standing labs drawn in November and every 3 months  If possible, please have your labs drawn 2 weeks prior to your appointment so that the provider can discuss your results at your appointment.  Please note that you may see your imaging and lab results in [place] before we have reviewed them. We may be awaiting multiple results to interpret others before contacting you. Please allow our office up to 72 hours to thoroughly review all of the results before contacting the office for clarification of your results.  We have open lab daily: Monday through Thursday from 1:30-4:30 PM and Friday from 1:30-4:00 PM at the office of Dr. Bo Merino, [facility].   Please be advised, all patients with office appointments requiring lab work will take precedent over walk-in lab work.  If possible, please come for your lab work on Monday and Friday afternoons, as you may experience shorter wait times. The office is located at [address] [place], [place], [place] [address] No appointment is necessary.   Labs are drawn by [facility]. Please bring your co-pay at the time of your lab draw.  You may receive a bill from [facility] for your lab work.  If you wish to have your labs drawn at another location, please call the office 24 hours in advance to send orders.  If you have any questions regarding directions or hours of operation,  please call [phone].   As a reminder, please drink plenty of water prior to coming for your lab work. Thanks!   Vaccines You are taking a medication(s) that can suppress your immune system.  The following immunizations are recommended: Flu annually Covid-19  Td/Tdap (tetanus, diphtheria, pertussis) every 10 years Pneumonia (Prevnar 15 then Pneumovax 23 at least 1  year apart.  Alternatively, can take Prevnar 20 without needing additional dose) Shingrix (after age 61): 2 doses from 4 weeks to 6 months apart  Please check with your PCP to make sure you are up to date.   If you test POSITIVE for COVID19 and have MILD to MODERATE symptoms: First, call your PCP if you would like to receive COVID19 treatment AND Hold your medications during the infection and for at least 1 week after your symptoms have resolved: Injectable medication (Benlysta, Cimzia, Cosentyx, Enbrel, Humira, Orencia, Remicade, Simponi, Stelara, Taltz, Tremfya) Methotrexate Leflunomide (Arava) Azathioprine Mycophenolate (Cellcept) Roma Kayser, or Rinvoq Otezla If you take Actemra or Kevzara, you DO NOT need to hold these for COVID19 infection.  If you test POSITIVE for COVID19 and have NO symptoms: First, call your PCP if you would like to receive COVID19 treatment AND Hold your medications for at least 10 days after the day that you tested positive Injectable medication (Benlysta, Cimzia, Cosentyx, Enbrel, Humira, Orencia, Remicade, Simponi, Stelara, Taltz, Tremfya) Methotrexate Leflunomide (Arava) Azathioprine Mycophenolate (Cellcept) Roma Kayser, or Rinvoq Otezla If you take Actemra or Kevzara, you DO NOT need to hold these for COVID19 infection.  If you have signs or symptoms of an infection or start antibiotics: First, call your PCP for workup of your infection. Hold your medication through the infection, until you complete your antibiotics, and until symptoms resolve if you take the following: Injectable medication (Actemra, Benlysta, Cimzia, Cosentyx, Enbrel, Humira, Kevzara, Orencia, Remicade, Simponi, Stelara,  Fanny Dance) Methotrexate Leflunomide (Arava) Mycophenolate (Cellcept) Roma Kayser, or Rinvoq  Heart Disease Prevention   Your inflammatory disease increases your risk of heart disease which includes heart attack, stroke, atrial  fibrillation (irregular heartbeats), high blood pressure, heart failure and atherosclerosis (plaque in the arteries).  It is important to reduce your risk by:   Keep blood pressure, cholesterol, and blood sugar at healthy levels   Smoking Cessation   Maintain a healthy weight  BMI 20-25   Eat a healthy diet  Plenty of fresh fruit, vegetables, and whole grains  Limit saturated fats, foods high in sodium, and added sugars  DASH and Mediterranean diet   Increase physical activity  Recommend moderate physically activity for 150 minutes per week/ 30 minutes a day for five days a week These can be broken up into three separate ten-minute sessions during the day.   Reduce Stress  Meditation, slow breathing exercises, yoga, coloring books  Dental visits twice a year

## 2020-12-12 NOTE — Telephone Encounter (Signed)
Please review correct prednisone taper and send to the pharmacy. Thank you!

## 2020-12-13 NOTE — Progress Notes (Signed)
CBC, CMP and sed rate are normal.

## 2020-12-17 ENCOUNTER — Ambulatory Visit (INDEPENDENT_AMBULATORY_CARE_PROVIDER_SITE_OTHER): Payer: BC Managed Care – PPO | Admitting: General Practice

## 2020-12-17 ENCOUNTER — Other Ambulatory Visit: Payer: Self-pay

## 2020-12-17 DIAGNOSIS — Z7901 Long term (current) use of anticoagulants: Secondary | ICD-10-CM

## 2020-12-17 DIAGNOSIS — R760 Raised antibody titer: Secondary | ICD-10-CM

## 2020-12-17 LAB — POCT INR: INR: 1.2 — AB (ref 2.0–3.0)

## 2020-12-17 NOTE — Patient Instructions (Signed)
Pre visit review using our clinic review tool, if applicable. No additional management support is needed unless otherwise documented below in the visit note.  Take 3 tablets today (8/1) and then continue to take 5 mg daily except 2.5 mg on Sundays and Thursdays.  Re-check in 1 week per patient request.

## 2020-12-19 LAB — COMPLETE METABOLIC PANEL WITH GFR
AG Ratio: 1.3 (calc) (ref 1.0–2.5)
ALT: 13 U/L (ref 6–29)
AST: 18 U/L (ref 10–35)
Albumin: 4.1 g/dL (ref 3.6–5.1)
Alkaline phosphatase (APISO): 79 U/L (ref 37–153)
BUN: 12 mg/dL (ref 7–25)
CO2: 26 mmol/L (ref 20–32)
Calcium: 9.5 mg/dL (ref 8.6–10.4)
Chloride: 103 mmol/L (ref 98–110)
Creat: 0.83 mg/dL (ref 0.50–1.05)
Globulin: 3.2 g/dL (calc) (ref 1.9–3.7)
Glucose, Bld: 86 mg/dL (ref 65–99)
Potassium: 4.5 mmol/L (ref 3.5–5.3)
Sodium: 138 mmol/L (ref 135–146)
Total Bilirubin: 0.8 mg/dL (ref 0.2–1.2)
Total Protein: 7.3 g/dL (ref 6.1–8.1)
eGFR: 80 mL/min/{1.73_m2} (ref >=60)

## 2020-12-19 LAB — SEDIMENTATION RATE: Sed Rate: 28 mm/h (ref 0–30)

## 2020-12-19 LAB — CBC WITH DIFFERENTIAL/PLATELET
Absolute Monocytes: 612 cells/uL (ref 200–950)
Basophils Absolute: 51 cells/uL (ref 0–200)
Basophils Relative: 0.6 %
Eosinophils Absolute: 153 cells/uL (ref 15–500)
Eosinophils Relative: 1.8 %
HCT: 45.7 % — ABNORMAL HIGH (ref 35.0–45.0)
Hemoglobin: 14.5 g/dL (ref 11.7–15.5)
Lymphs Abs: 3256 cells/uL (ref 850–3900)
MCH: 26.9 pg — ABNORMAL LOW (ref 27.0–33.0)
MCHC: 31.7 g/dL — ABNORMAL LOW (ref 32.0–36.0)
MCV: 84.6 fL (ref 80.0–100.0)
MPV: 11.5 fL (ref 7.5–12.5)
Monocytes Relative: 7.2 %
Neutro Abs: 4429 cells/uL (ref 1500–7800)
Neutrophils Relative %: 52.1 %
Platelets: 267 10*3/uL (ref 140–400)
RBC: 5.4 10*6/uL — ABNORMAL HIGH (ref 3.80–5.10)
RDW: 13.1 % (ref 11.0–15.0)
Total Lymphocyte: 38.3 %
WBC: 8.5 10*3/uL (ref 3.8–10.8)

## 2020-12-19 LAB — HYDROXYCHLOROQUINE,BLOOD: HYDROXYCHLOROQUINE, (B): 570 ng/mL — ABNORMAL HIGH

## 2020-12-20 ENCOUNTER — Telehealth: Payer: Self-pay | Admitting: *Deleted

## 2020-12-20 ENCOUNTER — Encounter: Payer: Self-pay | Admitting: Rheumatology

## 2020-12-20 ENCOUNTER — Other Ambulatory Visit: Payer: Self-pay | Admitting: *Deleted

## 2020-12-20 ENCOUNTER — Ambulatory Visit (INDEPENDENT_AMBULATORY_CARE_PROVIDER_SITE_OTHER): Payer: BC Managed Care – PPO | Admitting: Psychology

## 2020-12-20 DIAGNOSIS — F411 Generalized anxiety disorder: Secondary | ICD-10-CM | POA: Diagnosis not present

## 2020-12-20 DIAGNOSIS — M0609 Rheumatoid arthritis without rheumatoid factor, multiple sites: Secondary | ICD-10-CM

## 2020-12-20 MED ORDER — HYDROXYCHLOROQUINE SULFATE 200 MG PO TABS: ORAL_TABLET | ORAL | 0 refills | Status: DC

## 2020-12-20 NOTE — Telephone Encounter (Signed)
-----   Message from Ofilia Neas, PA-C sent at 12/20/2020 12:11 PM EDT ----- I spoke with Dr. Estanislado Pandy and she said around 1000.   It is not crucial for her to reach 1000 but we would like to try to increase her dose to improve her symptoms. We will recheck HCQ blood level in 3 months.

## 2020-12-20 NOTE — Telephone Encounter (Signed)
-----   Message from Ofilia Neas, PA-C sent at 12/19/2020  4:38 PM EDT ----- Reviewed lab work with Dr. Estanislado Pandy.  Patient had a recent flare requiring prednisone.  Her HCQ blood level is slightly subtherapeutic-570, so we recommend increasing the plaquenil dose to 200 mg BID Monday through Friday only.  Please notify the patient and change prescription accordingly.

## 2020-12-20 NOTE — Progress Notes (Signed)
I spoke with Dr. Estanislado Pandy and she said around 1000.   It is not crucial for her to reach 1000 but we would like to try to increase her dose to improve her symptoms. We will recheck HCQ blood level in 3 months.

## 2020-12-24 ENCOUNTER — Other Ambulatory Visit: Payer: Self-pay

## 2020-12-24 ENCOUNTER — Ambulatory Visit (INDEPENDENT_AMBULATORY_CARE_PROVIDER_SITE_OTHER): Payer: BC Managed Care – PPO | Admitting: General Practice

## 2020-12-24 DIAGNOSIS — R760 Raised antibody titer: Secondary | ICD-10-CM | POA: Diagnosis not present

## 2020-12-24 DIAGNOSIS — Z7901 Long term (current) use of anticoagulants: Secondary | ICD-10-CM

## 2020-12-24 LAB — POCT INR: INR: 1.5 — AB (ref 2.0–3.0)

## 2020-12-24 NOTE — Patient Instructions (Signed)
Pre visit review using our clinic review tool, if applicable. No additional management support is needed unless otherwise documented below in the visit note.  Continue to take 5 mg daily except 2.5 mg on Sundays and Thursdays.  Re-check in 3 week per patient request.

## 2020-12-31 ENCOUNTER — Other Ambulatory Visit: Payer: Self-pay | Admitting: Physician Assistant

## 2020-12-31 NOTE — Telephone Encounter (Signed)
Next Visit: 03/13/2021  Last Visit: 12/12/2020  Last Fill: 08/07/2020  DX: Age related osteoporosis, unspecified pathological fracture presence   Current Dose per office note 12/12/2020:  alendronate 70 mg p.o. weekly  Labs: 12/12/2020 CBC, CMP and sed rate are normal.  Okay to refill Fosamax?

## 2021-01-03 ENCOUNTER — Ambulatory Visit (INDEPENDENT_AMBULATORY_CARE_PROVIDER_SITE_OTHER): Payer: BC Managed Care – PPO | Admitting: Psychology

## 2021-01-03 DIAGNOSIS — F411 Generalized anxiety disorder: Secondary | ICD-10-CM | POA: Diagnosis not present

## 2021-01-09 ENCOUNTER — Ambulatory Visit: Payer: BC Managed Care – PPO

## 2021-01-10 ENCOUNTER — Encounter: Payer: Self-pay | Admitting: Rheumatology

## 2021-01-14 ENCOUNTER — Other Ambulatory Visit: Payer: Self-pay

## 2021-01-14 ENCOUNTER — Ambulatory Visit (INDEPENDENT_AMBULATORY_CARE_PROVIDER_SITE_OTHER): Payer: BC Managed Care – PPO

## 2021-01-14 DIAGNOSIS — Z7952 Long term (current) use of systemic steroids: Secondary | ICD-10-CM | POA: Diagnosis not present

## 2021-01-14 LAB — POCT INR: INR: 1.6 — AB (ref 2.0–3.0)

## 2021-01-14 NOTE — Patient Instructions (Addendum)
Pre visit review using our clinic review tool, if applicable. No additional management support is needed unless otherwise documented below in the visit note.  Continue to take 5 mg daily except 2.5 mg on Sundays and Thursdays.  Re-check in 4 week per patient request.

## 2021-01-15 ENCOUNTER — Telehealth: Payer: Self-pay | Admitting: Internal Medicine

## 2021-01-15 NOTE — Telephone Encounter (Signed)
Patient called wanting to know which Shingles vaccine she needs to get because she is immunosuppressed. She is wanting to get it in office but is unsure and wants to speak about options. She has never had a singles shot before.    Good callback number [phone]   Please Advise

## 2021-01-16 NOTE — Telephone Encounter (Signed)
Okay to get the shingles vaccine in the    But may want to talk with her rheumatologist about best timing.  To get the best immune response.  It is not a live vaccine. When on immunosuppression you just may not get as good of a immune response but should not be dangerous or harmful to you.

## 2021-01-16 NOTE — Telephone Encounter (Signed)
Pt informed of the message below and verbalized understanding. 

## 2021-01-17 ENCOUNTER — Ambulatory Visit: Payer: BC Managed Care – PPO | Admitting: Psychology

## 2021-01-17 MED ORDER — SUMATRIPTAN SUCCINATE 50 MG PO TABS: ORAL_TABLET | ORAL | 1 refills | Status: DC

## 2021-01-23 ENCOUNTER — Ambulatory Visit (INDEPENDENT_AMBULATORY_CARE_PROVIDER_SITE_OTHER): Payer: BC Managed Care – PPO

## 2021-01-23 ENCOUNTER — Other Ambulatory Visit: Payer: Self-pay

## 2021-01-23 DIAGNOSIS — Z23 Encounter for immunization: Secondary | ICD-10-CM

## 2021-01-23 NOTE — Patient Instructions (Signed)
Health Maintenance Due  Topic Date Due   Pneumococcal Vaccine 21-62 Years old (1 - PCV) Never done   Zoster Vaccines- Shingrix (1 of 2) Never done   COLONOSCOPY (Pts 45-65yr Insurance coverage will need to be confirmed)  05/19/2016   PAP SMEAR-Modifier  06/21/2019   INFLUENZA VACCINE  12/17/2020    Depression screen PHQ 2/9 07/20/2018  Decreased Interest 0  Down, Depressed, Hopeless 0  PHQ - 2 Score 0  Some recent data might be hidden

## 2021-01-23 NOTE — Progress Notes (Signed)
Per orders of Dr. Regis Bill, injection of Shingrix vaccine given by Franco Collet. Patient tolerated injection well.

## 2021-01-31 ENCOUNTER — Ambulatory Visit: Payer: BC Managed Care – PPO | Admitting: Psychology

## 2021-02-04 ENCOUNTER — Ambulatory Visit (INDEPENDENT_AMBULATORY_CARE_PROVIDER_SITE_OTHER): Payer: BC Managed Care – PPO | Admitting: Psychology

## 2021-02-04 DIAGNOSIS — F411 Generalized anxiety disorder: Secondary | ICD-10-CM | POA: Diagnosis not present

## 2021-02-12 ENCOUNTER — Other Ambulatory Visit: Payer: Self-pay

## 2021-02-13 ENCOUNTER — Ambulatory Visit (INDEPENDENT_AMBULATORY_CARE_PROVIDER_SITE_OTHER): Payer: BC Managed Care – PPO

## 2021-02-13 DIAGNOSIS — Z7901 Long term (current) use of anticoagulants: Secondary | ICD-10-CM | POA: Diagnosis not present

## 2021-02-13 LAB — POCT INR: INR: 1.5 — AB (ref 2.0–3.0)

## 2021-02-13 NOTE — Patient Instructions (Addendum)
Pre visit review using our clinic review tool, if applicable. No additional management support is needed unless otherwise documented below in the visit note.  Continue to take 5 mg daily except 2.5 mg on Sundays and Thursdays.  Re-check in 4 week per patient request.

## 2021-02-14 ENCOUNTER — Ambulatory Visit: Payer: BC Managed Care – PPO | Admitting: Psychology

## 2021-02-18 ENCOUNTER — Ambulatory Visit (INDEPENDENT_AMBULATORY_CARE_PROVIDER_SITE_OTHER): Payer: BC Managed Care – PPO | Admitting: Psychology

## 2021-02-18 DIAGNOSIS — F411 Generalized anxiety disorder: Secondary | ICD-10-CM | POA: Diagnosis not present

## 2021-02-19 ENCOUNTER — Ambulatory Visit: Payer: BC Managed Care – PPO | Admitting: Nurse Practitioner

## 2021-02-19 ENCOUNTER — Encounter: Payer: Self-pay | Admitting: Nurse Practitioner

## 2021-02-19 ENCOUNTER — Telehealth: Payer: Self-pay

## 2021-02-19 VITALS — BP 102/56 | HR 64 | Ht 63.5 in | Wt 162.0 lb

## 2021-02-19 DIAGNOSIS — Z1211 Encounter for screening for malignant neoplasm of colon: Secondary | ICD-10-CM

## 2021-02-19 DIAGNOSIS — Z7901 Long term (current) use of anticoagulants: Secondary | ICD-10-CM

## 2021-02-19 MED ORDER — NA SULFATE-K SULFATE-MG SULF 17.5-3.13-1.6 GM/177ML PO SOLN: 1.0000 | Freq: Once | ORAL | 0 refills | Status: AC

## 2021-02-19 NOTE — Patient Instructions (Signed)
If you are age 62 or younger, your body mass index should be between 19-25. Your Body mass index is 28.25 kg/m. If this is out of the aformentioned range listed, please consider follow up with your Primary Care Provider.  __________________________________________________________  The [facility] providers would like to encourage you to use [facility] to communicate with providers for non-urgent requests or questions.  Due to long hold times on the telephone, sending your provider a message by [facility] may be a faster and more efficient way to get a response.  Please allow 48 business hours for a response.  Please remember that this is for non-urgent requests.   You have been scheduled for a colonoscopy. Please follow written instructions given to you at your visit today.  Please pick up your prep supplies at the pharmacy within the next 1-3 days. If you use inhalers (even only as needed), please bring them with you on the day of your procedure.  Follow up pending the results of your Colonoscopy or as needed.  Thank you for entrusting me with your care and choosing [facility].  Tye Savoy, NP-C

## 2021-02-19 NOTE — Telephone Encounter (Signed)
Pre-operative Risk Assessment     Request for surgical clearance:     Endoscopy Procedure  What type of surgery is being performed?     Colonoscopy  When is this surgery scheduled?     04/04/2021  What type of clearance is required ?   Pharmacy  Are there any medications that need to be held prior to surgery and how long? Coumadin starting 5 days prior.. Please bridge patient if this needed.  Practice name and name of physician performing surgery?      [facility]  What is your office phone and fax number?      Phone- [phone]  [phone]  Anesthesia type (None, local, MAC, general) ?       MAC

## 2021-02-19 NOTE — Progress Notes (Signed)
PG, Reviewed.  Please make sure there is documented communication between the yourself/your agent and the patient's hematologist. We can perform the exam on coumadin if necessary. Thanks  JP

## 2021-02-19 NOTE — Progress Notes (Signed)
ASSESSMENT AND PLAN    # 60 yo female overdue for 10 year screening colonoscopy. She has postponed colonoscopy for the last few years. Very occasional, scant rectal bleeding if constipated. Otherwise, no GI complaints. Hgb normal.  -- Patient will be scheduled for colonoscopy. The risks and benefits of colonoscopy with possible polypectomy / biopsies were discussed and the patient agrees to proceed.   # Hypercoagulable state / history of bilateral PE. On chronic warfarin.  --Hold Coumadinx for 5 days before procedure - will instruct when and how to resume after procedure. Patient understands that there is a low but real risk of cardiovascular event such as heart attack, stroke, or embolism /  thrombosis, or ischemia while off Coumadin. The patient consents to proceed. Will communicate by phone or EMR with patient's prescribing provider to confirm that holding Coumadin is reasonable in this case.    # Rheumatoid Arthritis, on plaquenil and methotrexate. Sometimes requires steroids.   # Multiple drug allergies. IV dye allergy.   HISTORY OF PRESENT ILLNESS     Chief Complaint : discuss colonoscopy   [name] is a 60 y.o. female from [place] with a past medical history significant for GERD, rheumatoid arthritis, hypercoagulable disorder evaluated at [facility] in 2015, history of bilateral PE on chronic Coumadin. See PMH below for any additional history.   Patient is known to Dr. Henrene Pastor. She was seen in 2018 at which time she and Dr. Henrene Pastor had a detailed discussion of colon cancer screening options. She wanted to think about her options and get back to [place].  She returned in Sept 2021 for evaluation of abdominal pain. It was noted that she never did have any form of colon cancer screening done.  She still was not ready to proceed , at that time due to the COVID 19 pandemic.   Patient comes in today ready to proceed with a colonoscopy.  She has been vaccinated against COVID.  She is comfortable  holding warfarin for the procedure.  Patient says that she is at low risk for PE. She has no GI complaints. She rarely has very scant rectal bleeding if constipated. For the most part her bowel movements are normal.    Data Reviewed: July 2022 CMP normal Hemoglobin 14.5, MCV 84, platelets 267  Past Medical History:  Diagnosis Date   Adjustment disorder with anxiety    B12 deficiency due to diet    is a vegetarian   Chronic anticoagulation    Fatty liver    Per patient   Headache(784.0)    migraines and head pain   Hx of varicella    As Child   Iron deficiency anemia, unspecified    Otalgia, unspecified    Other and unspecified hyperlipidemia    Palpitations    Personal history of venous thrombosis and embolism    Recurrent UTI    Rheumatoid arthritis(714.0)    deveshewar   Unspecified vitamin D deficiency    UTI (lower urinary tract infection) 11/28/2011   citrobacter        Past Surgical History:  Procedure Laterality Date   ORIF ULNAR FRACTURE     TYMPANOSTOMY TUBE PLACEMENT  2009   Right   Family History  Problem Relation Age of Onset   Dementia Father    Stroke Father    Hyperlipidemia Father    Diabetes Father    Parkinsonism Mother    Lymphoma Other    Colon cancer Neg Hx    Social  History   Tobacco Use   Smoking status: Never   Smokeless tobacco: Never  Vaping Use   Vaping Use: Never used  Substance Use Topics   Alcohol use: No   Drug use: Never   Current Outpatient Medications  Medication Sig Dispense Refill   alendronate (FOSAMAX) 70 MG tablet TAKE 1 TABLET(70 MG) BY MOUTH 1 TIME A WEEK WITH A FULL GLASS OF WATER AND ON AN EMPTY STOMACH 4 tablet 2   Calcium Carb-Cholecalciferol 600-800 MG-UNIT TABS Take by mouth.     Cholecalciferol (VITAMIN D3) 2000 UNITS TABS Take 1 tablet by mouth daily.     cycloSPORINE (RESTASIS) 0.05 % ophthalmic emulsion Place 1 drop into both eyes daily.     Eflornithine HCl 13.9 % cream Apply topically daily.       folic acid (FOLVITE) 1 MG tablet TAKE 2 TABLETS(2 MG) BY MOUTH DAILY 180 tablet 3   hydroxychloroquine (PLAQUENIL) 200 MG tablet Take 1 tablet 200 mg BID Monday-Friday 120 tablet 0   methotrexate (RHEUMATREX) 2.5 MG tablet Take 8 tablets (20 mg total) by mouth once a week. Caution:Chemotherapy. Protect from light. 96 tablet 0   mineral/vitamin supplement (MULTIGEN) 70 MG TABS tablet TAKE 1 TABLET BY MOUTH DAILY 90 tablet 3   omeprazole (PRILOSEC) 20 MG capsule Take 1 capsule (20 mg total) by mouth as needed. 30 capsule 3   SUMAtriptan (IMITREX) 50 MG tablet TAKE ONE TABLET BY MOUTH AS NEEDED. MAY REPEAT IN 2 TO 4 HOURS IF NEEDED. 9 tablet 1   warfarin (COUMADIN) 2.5 MG tablet TAKE 2 TABLETS BY MOUTH DAILY, EXCEPT ON MONDAY AND THURSDAY TAKE 1 TABLET. OR AS DIRECTED BY ANTICOAGULATION CLINIC (Patient taking differently: TAKE 2 TABLETS BY MOUTH DAILY, EXCEPT ON SUNDAY AND THURSDAY TAKE 1 TABLET. OR AS DIRECTED BY ANTICOAGULATION CLINIC) 180 tablet 1   No current facility-administered medications for this visit.   Allergies  Allergen Reactions   Boniva [Ibandronate Sodium]     Heartburn   Ivp Dye [Iodinated Diagnostic Agents] Swelling    Mouth swelling.   Other Swelling    Mouth swelling. Tongue    Risedronate Sodium Other (See Comments)    Foot and bone pain and fatigue Foot and bone pain and fatigue   Risedronate Sodium [Risedronate Sodium] Other (See Comments)    Foot and bone pain and fatigue   Macrobid [Nitrofurantoin] Rash     Review of Systems: No chest pain. No shortness of breath. No urinary symptoms  PHYSICAL EXAM :    Wt Readings from Last 3 Encounters:  02/19/21 162 lb (73.5 kg)  12/12/20 156 lb 3.2 oz (70.9 kg)  10/29/20 157 lb 9.6 oz (71.5 kg)    BP (!) 102/56   Pulse 64   Ht 5' 3.5" (1.613 m)   Wt 162 lb (73.5 kg)   BMI 28.25 kg/m  Constitutional:  Pleasant female in no acute distress. Wearing face mask , disposable gloves Psychiatric: Normal mood and affect.  Behavior is normal. EENT: Pupils normal.  Conjunctivae are normal. No scleral icterus. Cardiovascular: Normal rate, regular rhythm. No edema Pulmonary/chest: Effort normal and breath sounds normal. No wheezing, rales or rhonchi. Abdominal: Soft, nondistended, nontender. Bowel sounds active throughout. There are no masses palpable. No hepatomegaly. Neurological: Alert and oriented to person place and time.  Tye Savoy, NP  02/19/2021, 11:42 AM  Cc:  Burnis Medin, MD

## 2021-02-22 ENCOUNTER — Other Ambulatory Visit: Payer: Self-pay

## 2021-02-22 NOTE — Telephone Encounter (Signed)
Patient called stating at her last appointment Dr. Estanislado Pandy was planning to send a new prescription with increased dosage of Plaquenil to [facility] in August.  Patient states [facility] never received the prescription and she only has 1 week remaining.

## 2021-02-22 NOTE — Telephone Encounter (Signed)
Next Visit: 03/13/2021  Last Visit: 12/12/2020  Labs: 12/12/2020 CBC, CMP and sed rate are normal.  Eye exam: 08/08/2020   Current Dose per telephone encounter on  12/20/2020: Her HCQ blood level is slightly subtherapeutic-570, so we recommend increasing the plaquenil dose to 200 mg BID Monday through Friday only.   DX: Rheumatoid arthritis of multiple sites with negative rheumatoid factor  Okay to refill Plaquenil?

## 2021-02-25 ENCOUNTER — Ambulatory Visit: Payer: BC Managed Care – PPO | Admitting: Psychology

## 2021-02-25 MED ORDER — HYDROXYCHLOROQUINE SULFATE 200 MG PO TABS: ORAL_TABLET | ORAL | 0 refills | Status: DC

## 2021-02-28 ENCOUNTER — Ambulatory Visit: Payer: BC Managed Care – PPO | Admitting: Psychology

## 2021-02-28 NOTE — Progress Notes (Signed)
Office Visit Note  Patient: [name]             Date of Birth: 09-24-1960           MRN: [id number]             PCP: Burnis Medin, MD Referring: Burnis Medin, MD Visit Date: 03/13/2021 Occupation: @GUAROCC @  Subjective:  Medication management.   History of Present Illness: Jamyrah Saur is a 60 y.o. female with a history of rheumatoid arthritis, osteoarthritis, sicca symptoms and osteoporosis.  She was given prednisone after the last visit as she was having a mild flare.  She is off prednisone now.  She has been taking methotrexate and Plaquenil regular basis.  She has been experiencing some discomfort in her hands.  She also has some discomfort in her knee joints.  She has not noticed any joint swelling.  Activities of Daily Living:  Patient reports morning stiffness for a few minutes.   Patient Reports nocturnal pain.  Difficulty dressing/grooming: Denies Difficulty climbing stairs: Reports Difficulty getting out of chair: Denies Difficulty using hands for taps, buttons, cutlery, and/or writing: Reports  Review of Systems  Constitutional:  Positive for fatigue.  HENT:  Positive for mouth dryness. Negative for mouth sores and nose dryness.   Eyes:  Positive for dryness. Negative for pain and itching.  Respiratory:  Negative for shortness of breath and difficulty breathing.   Cardiovascular:  Negative for chest pain and palpitations.  Gastrointestinal:  Negative for blood in stool, constipation and diarrhea.  Endocrine: Positive for cold intolerance and increased urination.  Genitourinary:  Negative for difficulty urinating.  Musculoskeletal:  Positive for joint pain, joint pain, myalgias, morning stiffness and myalgias. Negative for joint swelling.  Skin:  Negative for color change, rash and redness.  Allergic/Immunologic: Positive for susceptible to infections.  Neurological:  Negative for dizziness, numbness, headaches, memory loss and weakness.   Hematological:  Positive for bruising/bleeding tendency.  Psychiatric/Behavioral:  Negative for confusion.    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[place]) 01/06/2018   Long term (current) use of anticoagulants 02/11/2017   Sicca syndrome ([place]) 12/22/2016   ANA positive 12/22/2016   Raised intraocular pressure of both eyes 12/22/2016   Migraine 03/31/2016   Anxiety 03/31/2016   High risk medication use 03/31/2016   Primary osteoarthritis of both feet 03/31/2016   Primary osteoarthritis of both knees 03/31/2016   Stiffness of left hand joint 11/27/2014   Elevated IOP 06/09/2014   Long term current use of systemic steroids 06/09/2014   Medically complex patient 06/09/2014   Current use of steroid medication 01/09/2014   Xerosis of skin 12/07/2013   Encounter for therapeutic drug monitoring 06/09/2013   B12 deficiency 05/31/2013   Iron deficiency 05/31/2013   Visit for preventive health examination 05/25/2012   Mammogram abnormal 04/12/2012   Iliac crest  pain 01/23/2012   Vaginal atrophy 01/23/2012   History of recurrent UTI (urinary tract infection) 01/23/2012   Osteoporosis 04/29/2011   Nonspecific abnormal results of liver function study 12/08/2010   Chronic anticoagulation    Anticoagulant long-term use 07/15/2010   Anticardiolipin antibody positive 07/15/2010   Chronic cough 03/19/2009   CHEST PAIN-UNSPECIFIED 08/26/2008   PALPITATIONS, RECURRENT 07/24/2008   Vitamin D deficiency 05/30/2008   HYPERLIPIDEMIA 05/30/2008   IRON DEFICIENCY 05/30/2008   Adjustment disorder with anxiety 05/30/2008   Rheumatoid arthritis ([place]) 05/30/2008   PULMONARY EMBOLISM, HX  OF 05/30/2008    Past Medical History:  Diagnosis Date   Adjustment disorder with anxiety    B12 deficiency due to diet    is a vegetarian   Chest pain, unspecified    Chronic anticoagulation    Fatty liver    Per patient   Headache(784.0)     migraines and head pain   Hx of varicella    As Child   Iron deficiency anemia, unspecified    Otalgia, unspecified    Other and unspecified hyperlipidemia    Palpitations    Personal history of venous thrombosis and embolism    Recurrent UTI    Rheumatoid arthritis(714.0)    deveshewar   Unspecified vitamin D deficiency    UTI (lower urinary tract infection) 11/28/2011   citrobacter       Family History  Problem Relation Age of Onset   Dementia Father    Stroke Father    Hyperlipidemia Father    Diabetes Father    Parkinsonism Mother    Lymphoma Other    Colon cancer Neg Hx    Past Surgical History:  Procedure Laterality Date   ORIF ULNAR FRACTURE     TYMPANOSTOMY TUBE PLACEMENT  2009   Right   Social History   Social History Narrative   Regular Exercise-no   20 yrs in [place]   Son at [employer]   From [place].   Married [place]  of 3    G1P1            Immunization History  Administered Date(s) Administered   Influenza,inj,Quad PF,6+ Mos 04/19/2013, 06/27/2015, 03/05/2016, 02/11/2017, 03/15/2018, 02/23/2019, 04/02/2020   PFIZER(Purple Top)SARS-COV-2 Vaccination 08/06/2019, 08/25/2019, 01/12/2020, 11/10/2020   PPD Test 07/21/2016   Tdap 05/31/2013   Zoster Recombinat (Shingrix) 01/23/2021     Objective: Vital Signs: BP 94/64 (BP Location: Right Arm, Patient Position: Sitting, Cuff Size: Normal)   Pulse 61   Ht 5' 3.5" (1.613 m)   Wt 162 lb 9.6 oz (73.8 kg)   BMI 28.35 kg/m    Physical Exam Vitals and nursing note reviewed.  Constitutional:      Appearance: She is well-developed.  HENT:     Head: Normocephalic and atraumatic.  Eyes:     Conjunctiva/sclera: Conjunctivae normal.  Cardiovascular:     Rate and Rhythm: Normal rate and regular rhythm.     Heart sounds: Normal heart sounds.  Pulmonary:     Effort: Pulmonary effort is normal.     Breath sounds: Normal breath sounds.  Abdominal:     General: Bowel sounds are normal.     Palpations: Abdomen is  soft.  Musculoskeletal:     Cervical back: Normal range of motion.  Lymphadenopathy:     Cervical: No cervical adenopathy.  Skin:    General: Skin is warm and dry.     Capillary Refill: Capillary refill takes less than 2 seconds.  Neurological:     Mental Status: She is alert and oriented to person, place, and time.  Psychiatric:        Behavior: Behavior normal.     Musculoskeletal Exam: C-spine was in good range of motion.  Shoulder joints, elbow joints, wrist joints, MCPs PIPs and DIPs with good range of motion with no synovitis.  Hip joints, knee joints, ankles, MTPs and PIPs with good range of motion with no synovitis.  CDAI Exam: CDAI Score: 0.4  Patient Global: 2 mm; Provider Global: 2 mm Swollen: 0 ; Tender: 0  Joint Exam  03/13/2021   No joint exam has been documented for this visit   There is currently no information documented on the homunculus. Go to the Rheumatology activity and complete the homunculus joint exam.  Investigation: No additional findings.  Imaging: No results found.  Recent Labs: Lab Results  Component Value Date   WBC 8.5 12/12/2020   HGB 14.5 12/12/2020   PLT 267 12/12/2020   NA 138 12/12/2020   K 4.5 12/12/2020   CL 103 12/12/2020   CO2 26 12/12/2020   GLUCOSE 86 12/12/2020   BUN 12 12/12/2020   CREATININE 0.83 12/12/2020   BILITOT 0.8 12/12/2020   ALKPHOS 60 08/29/2020   AST 18 12/12/2020   ALT 13 12/12/2020   PROT 7.3 12/12/2020   ALBUMIN 3.7 08/29/2020   CALCIUM 9.5 12/12/2020   GFRAA 96 07/26/2020   QFTBGOLDPLUS NEGATIVE 09/12/2020    Speciality Comments: PLQ Eye Exam: 08/08/2020 WNL @ [facility] Follow up 6 months  Fosamax 11/21  Procedures:  No procedures performed Allergies: Boniva [ibandronate sodium], Ivp dye [iodinated diagnostic agents], Other, Risedronate sodium, Risedronate sodium [risedronate sodium], and Macrobid [nitrofurantoin]   Assessment / Plan:     Visit Diagnoses: Rheumatoid arthritis of  multiple sites with negative rheumatoid factor (HCC) - RF negative, anti-CCP positive, ANA positive  -patient had a flare at the last visit as she was off methotrexate.  She was given a prednisone taper which she finished.  She states she did really well after taking the prednisone.  She has been on combination therapy of methotrexate and Plaquenil which she is tolerating well.  She states she continues to have some stiffness in her hands and her knee joints.  No warmth swelling or effusion was noted.  No synovitis was noted.  Plan: Sedimentation rate with her next labs in in January.  High risk medication use - Plaquenil 200 mg by mouth daily, Methotrexate 8 tablets by mouth every week, and folic acid 2 mg by mouth daily.PLQ Eye Exam: 08/08/2020 -her last labs were in July 2022 which included CBC and CMP with GFR which were within normal limits.  She plans to get repeat labs with her PCPs office and will forward results to [place].  She has been advised to stop methotrexate in case she develops an infection and resume after infection resolves.  Information regarding realization was also placed in the AVS.  Plan: CBC with Differential/Platelet, COMPLETE METABOLIC PANEL WITH GFR, Hydroxychloroquine, Blood in January.  Sicca syndrome (HCC)-she continues to have dry mouth and dry eyes.  Over-the-counter products were discussed.  Pain in both hands-she complains of stiffness in her hands.  She has osteoarthritis which may be contributing to some of the discomfort.  She had no synovitis on examination.  Primary osteoarthritis of both knees-she complains of ongoing pain and discomfort in her knee joints.  No warmth swelling or effusion was noted.  Primary osteoarthritis of both feet-she continues to have some stiffness.  No warmth swelling or synovitis was noted.  Age related osteoporosis, unspecified pathological fracture presence - 02/02/20 T-score DualFemur Neck Left  02/02/2020 T score  -2.8, BMD 0.649 g/cm2.  alendronate 70 mg p.o. weekly.  She was started on Fosamax in November 2021.  She states she started few weeks later.  We will repeat DEXA scan in September 2023.  She plans to get her vitamin D level with her PCP.  Use of calcium rich diet and resistive exercises were discussed.  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  Fatty liver  History of hyperlipidemia  History of migraine  History of anemia  Frequent UTI  Orders: Orders Placed This Encounter  Procedures   CBC with Differential/Platelet   COMPLETE METABOLIC PANEL WITH GFR   Hydroxychloroquine, Blood   Sedimentation rate    Meds ordered this encounter  Medications   alendronate (FOSAMAX) 70 MG tablet    Sig: TAKE 1 TABLET(70 MG) BY MOUTH 1 TIME A WEEK WITH A FULL GLASS OF WATER AND ON AN EMPTY STOMACH    Dispense:  12 tablet    Refill:  0      Follow-Up Instructions: Return in about 3 months (around 06/13/2021) for Rheumatoid arthritis.   Bo Merino, MD  Note - This record has been created using Editor, commissioning.  Chart creation errors have been sought, but may not always  have been located. Such creation errors do not reflect on  the standard of medical care.

## 2021-03-04 ENCOUNTER — Other Ambulatory Visit: Payer: Self-pay | Admitting: *Deleted

## 2021-03-04 MED ORDER — METHOTREXATE 2.5 MG PO TABS: 20.0000 mg | ORAL_TABLET | ORAL | 0 refills | Status: DC

## 2021-03-04 NOTE — Telephone Encounter (Signed)
Next Visit: 03/13/2021  Last Visit: 12/12/2020  Last Fill: 12/12/2020  DX: Rheumatoid arthritis of multiple sites with negative rheumatoid factor   Current Dose per office note 12/12/2020: Methotrexate 8 tablets by mouth every week  Labs: 12/12/2020 CBC, CMP and sed rate are normal.  Okay to refill MTX?

## 2021-03-05 ENCOUNTER — Ambulatory Visit: Payer: BC Managed Care – PPO | Admitting: Psychology

## 2021-03-05 DIAGNOSIS — F411 Generalized anxiety disorder: Secondary | ICD-10-CM | POA: Diagnosis not present

## 2021-03-11 ENCOUNTER — Ambulatory Visit (INDEPENDENT_AMBULATORY_CARE_PROVIDER_SITE_OTHER): Payer: BC Managed Care – PPO | Admitting: Psychology

## 2021-03-11 DIAGNOSIS — F411 Generalized anxiety disorder: Secondary | ICD-10-CM | POA: Diagnosis not present

## 2021-03-11 NOTE — Telephone Encounter (Signed)
Should be okay to temporarily stop the Coumadin without bridge for short-term procedure. Medicine as per protocol sending to Coumadin team.

## 2021-03-12 ENCOUNTER — Other Ambulatory Visit: Payer: Self-pay

## 2021-03-12 NOTE — Telephone Encounter (Signed)
Pt has a coumadin clinic apt tomorrow.  Agree no lovenox bridge is needed, but will discuss with pt. Pt should hold coumadin for 5 days. Will discuss instructions for hold at pts apt tomorrow.   Schedule for pre and post colonoscopy for coumadin dosing.  11/12: Take last dose of coumadin 11/13: NO coumadin 11/14: NO coumadin 11/15: NO coumadin 11/16: NO coumadin  11/17: Colonoscopy, NO coumadin  11/18: Take 7.5 mg coumadin 11/19: Take 7.5 mg coumadin 11/20: Take 5 mg coumadin 11/21: Take 7.5 mg coumadin 11/22: Take normal dose 11/23: Take normal dose  11/24: Recheck INR, hold coumadin until recheck

## 2021-03-13 ENCOUNTER — Encounter: Payer: Self-pay | Admitting: Rheumatology

## 2021-03-13 ENCOUNTER — Telehealth: Payer: Self-pay | Admitting: Internal Medicine

## 2021-03-13 ENCOUNTER — Ambulatory Visit (INDEPENDENT_AMBULATORY_CARE_PROVIDER_SITE_OTHER): Payer: BC Managed Care – PPO

## 2021-03-13 ENCOUNTER — Ambulatory Visit (INDEPENDENT_AMBULATORY_CARE_PROVIDER_SITE_OTHER): Payer: BC Managed Care – PPO | Admitting: Rheumatology

## 2021-03-13 VITALS — BP 94/64 | HR 61 | Ht 63.5 in | Wt 162.6 lb

## 2021-03-13 DIAGNOSIS — N390 Urinary tract infection, site not specified: Secondary | ICD-10-CM

## 2021-03-13 DIAGNOSIS — M0609 Rheumatoid arthritis without rheumatoid factor, multiple sites: Secondary | ICD-10-CM

## 2021-03-13 DIAGNOSIS — Z79899 Other long term (current) drug therapy: Secondary | ICD-10-CM | POA: Diagnosis not present

## 2021-03-13 DIAGNOSIS — M170 Bilateral primary osteoarthritis of knee: Secondary | ICD-10-CM

## 2021-03-13 DIAGNOSIS — M19072 Primary osteoarthritis, left ankle and foot: Secondary | ICD-10-CM

## 2021-03-13 DIAGNOSIS — Z86711 Personal history of pulmonary embolism: Secondary | ICD-10-CM

## 2021-03-13 DIAGNOSIS — Z7901 Long term (current) use of anticoagulants: Secondary | ICD-10-CM

## 2021-03-13 DIAGNOSIS — M3500 Sicca syndrome, unspecified: Secondary | ICD-10-CM | POA: Diagnosis not present

## 2021-03-13 DIAGNOSIS — K760 Fatty (change of) liver, not elsewhere classified: Secondary | ICD-10-CM

## 2021-03-13 DIAGNOSIS — M810 Age-related osteoporosis without current pathological fracture: Secondary | ICD-10-CM

## 2021-03-13 DIAGNOSIS — M79641 Pain in right hand: Secondary | ICD-10-CM | POA: Diagnosis not present

## 2021-03-13 DIAGNOSIS — M79642 Pain in left hand: Secondary | ICD-10-CM

## 2021-03-13 DIAGNOSIS — Z8639 Personal history of other endocrine, nutritional and metabolic disease: Secondary | ICD-10-CM

## 2021-03-13 DIAGNOSIS — Z862 Personal history of diseases of the blood and blood-forming organs and certain disorders involving the immune mechanism: Secondary | ICD-10-CM

## 2021-03-13 DIAGNOSIS — Z23 Encounter for immunization: Secondary | ICD-10-CM

## 2021-03-13 DIAGNOSIS — H40053 Ocular hypertension, bilateral: Secondary | ICD-10-CM

## 2021-03-13 DIAGNOSIS — Z8669 Personal history of other diseases of the nervous system and sense organs: Secondary | ICD-10-CM

## 2021-03-13 DIAGNOSIS — M19071 Primary osteoarthritis, right ankle and foot: Secondary | ICD-10-CM

## 2021-03-13 LAB — POCT INR: INR: 1.5 — AB (ref 2.0–3.0)

## 2021-03-13 MED ORDER — ALENDRONATE SODIUM 70 MG PO TABS: ORAL_TABLET | ORAL | 0 refills | Status: DC

## 2021-03-13 NOTE — Telephone Encounter (Signed)
Patient wanted to get labs done sometime this week. Patient states that she was supposed to have active labs in her chart. I suggested that she make an appointment to discuss that with Dr.Panosh but she declined.   Patient would like a call back at [phone].  Please advise.

## 2021-03-13 NOTE — Patient Instructions (Signed)
Standing Labs We placed an order today for your standing lab work.   Please have your standing labs drawn in October and every 3 months  If possible, please have your labs drawn 2 weeks prior to your appointment so that the provider can discuss your results at your appointment.  Please note that you may see your imaging and lab results in [place] before we have reviewed them. We may be awaiting multiple results to interpret others before contacting you. Please allow our office up to 72 hours to thoroughly review all of the results before contacting the office for clarification of your results.  We have open lab daily: Monday through Thursday from 1:30-4:30 PM and Friday from 1:30-4:00 PM at the office of Dr. Bo Merino, [facility].   Please be advised, all patients with office appointments requiring lab work will take precedent over walk-in lab work.  If possible, please come for your lab work on Monday and Friday afternoons, as you may experience shorter wait times. The office is located at [address], [place], [place], [place] [address] No appointment is necessary.   Labs are drawn by [facility]. Please bring your co-pay at the time of your lab draw.  You may receive a bill from [facility] for your lab work.  If you wish to have your labs drawn at another location, please call the office 24 hours in advance to send orders.  If you have any questions regarding directions or hours of operation,  please call [phone].   As a reminder, please drink plenty of water prior to coming for your lab work. Thanks!   Vaccines You are taking a medication(s) that can suppress your immune system.  The following immunizations are recommended: Flu annually Covid-19  Td/Tdap (tetanus, diphtheria, pertussis) every 10 years Pneumonia (Prevnar 15 then Pneumovax 23 at least 1 year apart.  Alternatively, can take Prevnar 20 without needing additional dose) Shingrix: 2 doses from 4 weeks  to 6 months apart  Please check with your PCP to make sure you are up to date.   If you have signs or symptoms of an infection or start antibiotics: First, call your PCP for workup of your infection. Hold your medication through the infection, until you complete your antibiotics, and until symptoms resolve if you take the following: Injectable medication (Actemra, Benlysta, Cimzia, Cosentyx, Enbrel, Humira, Kevzara, Orencia, Remicade, Simponi, Stelara, Taltz, Tremfya) Methotrexate Leflunomide (Arava) Mycophenolate (Cellcept) Morrie Sheldon, Olumiant, or Rinvoq  Heart Disease Prevention   Your inflammatory disease increases your risk of heart disease which includes heart attack, stroke, atrial fibrillation (irregular heartbeats), high blood pressure, heart failure and atherosclerosis (plaque in the arteries).  It is important to reduce your risk by:   Keep blood pressure, cholesterol, and blood sugar at healthy levels   Smoking Cessation   Maintain a healthy weight  BMI 20-25   Eat a healthy diet  Plenty of fresh fruit, vegetables, and whole grains  Limit saturated fats, foods high in sodium, and added sugars  DASH and Mediterranean diet   Increase physical activity  Recommend moderate physically activity for 150 minutes per week/ 30 minutes a day for five days a week These can be broken up into three separate ten-minute sessions during the day.   Reduce Stress  Meditation, slow breathing exercises, yoga, coloring books  Dental visits twice a year

## 2021-03-13 NOTE — Patient Instructions (Addendum)
Pre visit review using our clinic review tool, if applicable. No additional management support is needed unless otherwise documented below in the visit note.  11/12: Take last dose of coumadin 11/13: NO coumadin 11/14: NO coumadin 11/15: NO coumadin 11/16: NO coumadin   11/17: Colonoscopy, NO coumadin   11/18: Take 7.5 mg coumadin 11/19: Take 7.5 mg coumadin 11/20: Take 5 mg coumadin 11/21: Take 7.5 mg coumadin 11/22: Take normal dose 11/23: Recheck INR

## 2021-03-14 ENCOUNTER — Ambulatory Visit: Payer: BC Managed Care – PPO | Admitting: Psychology

## 2021-03-17 NOTE — Telephone Encounter (Signed)
Not sure waht  to do with this message   If she wishes to have lab here ' please reorder the labs at our  office lab.

## 2021-03-18 NOTE — Telephone Encounter (Signed)
Orders have been placed for labs from Dr. Estanislado Pandy

## 2021-03-18 NOTE — Telephone Encounter (Signed)
Patient was advised on 03/13/2021 at her coumadin visit when to stop her coumadin and resume.

## 2021-03-19 NOTE — Progress Notes (Signed)
Patient doesn't have a Hematologist. Dr. Regis Bill manages her anticoagulation and has given clearance to hold warfarin without need for lovenox bridge.

## 2021-03-19 NOTE — Telephone Encounter (Signed)
Pt has sent mychart message and would like additional labs ordered . Pt is asking for the labs dr Regis Bill order 09-13-2019

## 2021-03-20 DIAGNOSIS — E538 Deficiency of other specified B group vitamins: Secondary | ICD-10-CM

## 2021-03-20 DIAGNOSIS — E559 Vitamin D deficiency, unspecified: Secondary | ICD-10-CM

## 2021-03-20 DIAGNOSIS — E611 Iron deficiency: Secondary | ICD-10-CM

## 2021-03-21 NOTE — Telephone Encounter (Signed)
Yes please add these to orders    dx  iron deficiency  b12 deficiency and  vit d deficiency

## 2021-03-21 NOTE — Telephone Encounter (Signed)
Lab orders have been placed and pt aware mychart message sent.

## 2021-03-22 ENCOUNTER — Other Ambulatory Visit (INDEPENDENT_AMBULATORY_CARE_PROVIDER_SITE_OTHER): Payer: BC Managed Care – PPO

## 2021-03-22 ENCOUNTER — Other Ambulatory Visit: Payer: Self-pay

## 2021-03-22 DIAGNOSIS — E538 Deficiency of other specified B group vitamins: Secondary | ICD-10-CM

## 2021-03-22 DIAGNOSIS — Z79899 Other long term (current) drug therapy: Secondary | ICD-10-CM

## 2021-03-22 DIAGNOSIS — E559 Vitamin D deficiency, unspecified: Secondary | ICD-10-CM | POA: Diagnosis not present

## 2021-03-22 DIAGNOSIS — E611 Iron deficiency: Secondary | ICD-10-CM

## 2021-03-22 LAB — CBC WITH DIFFERENTIAL/PLATELET
Basophils Absolute: 0 10*3/uL (ref 0.0–0.1)
Basophils Relative: 0.6 % (ref 0.0–3.0)
Eosinophils Absolute: 0.1 10*3/uL (ref 0.0–0.7)
Eosinophils Relative: 1.7 % (ref 0.0–5.0)
HCT: 43.5 % (ref 36.0–46.0)
Hemoglobin: 14 g/dL (ref 12.0–15.0)
Lymphocytes Relative: 36.2 % (ref 12.0–46.0)
Lymphs Abs: 2.9 10*3/uL (ref 0.7–4.0)
MCHC: 32.1 g/dL (ref 30.0–36.0)
MCV: 85.2 fl (ref 78.0–100.0)
Monocytes Absolute: 0.6 10*3/uL (ref 0.1–1.0)
Monocytes Relative: 7.2 % (ref 3.0–12.0)
Neutro Abs: 4.3 10*3/uL (ref 1.4–7.7)
Neutrophils Relative %: 54.3 % (ref 43.0–77.0)
Platelets: 231 10*3/uL (ref 150.0–400.0)
RBC: 5.11 Mil/uL (ref 3.87–5.11)
RDW: 13.9 % (ref 11.5–15.5)
WBC: 8 10*3/uL (ref 4.0–10.5)

## 2021-03-22 LAB — VITAMIN B12: Vitamin B-12: 415 pg/mL (ref 211–911)

## 2021-03-22 LAB — VITAMIN D 25 HYDROXY (VIT D DEFICIENCY, FRACTURES): VITD: 46.69 ng/mL (ref 30.00–100.00)

## 2021-03-22 LAB — SEDIMENTATION RATE: Sed Rate: 47 mm/hr — ABNORMAL HIGH (ref 0–30)

## 2021-03-24 NOTE — Progress Notes (Signed)
Results to be sent to Dr August Luz

## 2021-03-24 NOTE — Progress Notes (Signed)
Vitamin D is normal, B12 is normal, CBC is normal, CMP is normal, iron studies are normal, sedimentation rate is elevated indicating inflammation or infection.  Patient had a recent flare.  I would recommend repeat sed rate in 1 month.  Hydroxychloroquine level is pending.

## 2021-03-25 ENCOUNTER — Ambulatory Visit: Payer: BC Managed Care – PPO | Admitting: Psychology

## 2021-03-25 NOTE — Progress Notes (Signed)
Ok to order lipid panel with the next labs.

## 2021-03-26 ENCOUNTER — Telehealth: Payer: Self-pay | Admitting: *Deleted

## 2021-03-26 DIAGNOSIS — Z8639 Personal history of other endocrine, nutritional and metabolic disease: Secondary | ICD-10-CM

## 2021-03-26 DIAGNOSIS — M0609 Rheumatoid arthritis without rheumatoid factor, multiple sites: Secondary | ICD-10-CM

## 2021-03-26 DIAGNOSIS — Z79899 Other long term (current) drug therapy: Secondary | ICD-10-CM

## 2021-03-26 NOTE — Telephone Encounter (Signed)
-----   Message from Bo Merino, MD sent at 03/25/2021  9:01 PM EST ----- Ok to order lipid panel with the next labs.

## 2021-03-28 ENCOUNTER — Encounter: Payer: Self-pay | Admitting: Internal Medicine

## 2021-03-28 ENCOUNTER — Telehealth: Payer: Self-pay | Admitting: Nurse Practitioner

## 2021-03-28 NOTE — Telephone Encounter (Signed)
Patient called said her blood pressure was low on 03/13/21 and would like to speak with a nurse she is scheduled to have a colonoscopy on 04/04/21.

## 2021-04-01 ENCOUNTER — Ambulatory Visit: Payer: BC Managed Care – PPO | Admitting: Psychology

## 2021-04-01 ENCOUNTER — Other Ambulatory Visit: Payer: BC Managed Care – PPO

## 2021-04-01 ENCOUNTER — Telehealth (INDEPENDENT_AMBULATORY_CARE_PROVIDER_SITE_OTHER): Payer: BC Managed Care – PPO

## 2021-04-01 DIAGNOSIS — R300 Dysuria: Secondary | ICD-10-CM

## 2021-04-01 LAB — POC URINALSYSI DIPSTICK (AUTOMATED)
Bilirubin, UA: NEGATIVE
Blood, UA: POSITIVE
Glucose, UA: NEGATIVE
Ketones, UA: NEGATIVE
Nitrite, UA: NEGATIVE
Protein, UA: NEGATIVE
Spec Grav, UA: 1.02 (ref 1.010–1.025)
Urobilinogen, UA: NEGATIVE E.U./dL — AB
pH, UA: 6 (ref 5.0–8.0)

## 2021-04-01 NOTE — Addendum Note (Signed)
Addended by: Nilda Riggs on: 04/01/2021 09:07 AM   Modules accepted: Orders

## 2021-04-01 NOTE — Telephone Encounter (Signed)
She has had hx of utis .  Frequently Ok. To do u/a and culture . Please order  as per patient request

## 2021-04-01 NOTE — Telephone Encounter (Signed)
Orders have been placed.

## 2021-04-01 NOTE — Telephone Encounter (Signed)
Called patient back. No answer. Left a message to call back if she still has concerns she would like to discuss.

## 2021-04-01 NOTE — Telephone Encounter (Signed)
The pt came into the office stating that she needs a urinalysis dipstick and culture in regards to upcoming colonoscopy and frequent UTI. Pt stated she is currently not having symptoms but would like to have urine checked before she has colonoscopy procedure. Are orders ok to place? Please advise.

## 2021-04-01 NOTE — Telephone Encounter (Signed)
Pt complains of Dysuria, Urinary incontinence and slight abdominal pain. Pt reports no fever at this time.

## 2021-04-02 ENCOUNTER — Encounter: Payer: Self-pay | Admitting: Internal Medicine

## 2021-04-02 ENCOUNTER — Telehealth (INDEPENDENT_AMBULATORY_CARE_PROVIDER_SITE_OTHER): Payer: BC Managed Care – PPO | Admitting: Internal Medicine

## 2021-04-02 ENCOUNTER — Telehealth: Payer: Self-pay | Admitting: Internal Medicine

## 2021-04-02 VITALS — BP 104/74 | HR 60 | Temp 97.7°F | Ht 63.5 in | Wt 162.6 lb

## 2021-04-02 DIAGNOSIS — R3989 Other symptoms and signs involving the genitourinary system: Secondary | ICD-10-CM | POA: Diagnosis not present

## 2021-04-02 DIAGNOSIS — Z79899 Other long term (current) drug therapy: Secondary | ICD-10-CM | POA: Diagnosis not present

## 2021-04-02 DIAGNOSIS — Z7901 Long term (current) use of anticoagulants: Secondary | ICD-10-CM

## 2021-04-02 MED ORDER — CEFDINIR 300 MG PO CAPS: 300.0000 mg | ORAL_CAPSULE | Freq: Two times a day (BID) | ORAL | 0 refills | Status: DC

## 2021-04-02 NOTE — Telephone Encounter (Signed)
Patient had to cancel colonoscopy for this Thursday as she has a urinary tract infection.  She rescheduled to 1/16 at 10:30 a.m.  Can you please send her new instructions to reflect the time change?  Thank you.

## 2021-04-02 NOTE — Telephone Encounter (Signed)
Do not  necessarily think  need to cancel but  advise   you consult with the GI department doing the procedure    Suggest  virtual visit   to discuss   treatment ... urine culture is pending .  We may add antibiotic   in interim  if having symptoms

## 2021-04-02 NOTE — Telephone Encounter (Signed)
Pt is calling and think she has uti and also has colonoscopy on this Thursday and would like to know if she should cancel her colonoscopy. Pt had urine test yesterday

## 2021-04-02 NOTE — Progress Notes (Signed)
Virtual Visit via Video Note  I connected withNAME@ on 04/02/21 at  4:30 PM EST by a video enabled telemedicine application and verified that I am speaking with the correct person using two identifiers. Location patient: home Location provider:work  office Persons participating in the virtual visit: patient, provider  [place] national recommendations  regarding COVID 19 pandemic   video visit is advised over in office visit for this patient.  Patient aware  of the limitations of evaluation and management by telemedicine and  availability of in person appointments. and agreed to proceed.   HPI: [name] presents for video visit See messages recent onset of UTI type symptoms urinary may be vaginal pain with some dysuria with no associated fever but thinks it is getting worse like one of her UTIs.  She had used over the summer some prophylaxis given to her by GYN I see Macrobid but not using it now. Had been written a prescription by her GYN to have vaginal suppositories of estrogen but had currently declined because of concern for risk of clotting.  She is on low-dose Coumadin. Was supposed to have colonoscopy in 2 days but decided to cancel and reschedule.  Because of the symptoms.  ROS: See pertinent positives and negatives per HPI.  Past Medical History:  Diagnosis Date   Adjustment disorder with anxiety    B12 deficiency due to diet    is a vegetarian   Chest pain, unspecified    Chronic anticoagulation    Fatty liver    Per patient   Headache(784.0)    migraines and head pain   Hx of varicella    As Child   Iron deficiency anemia, unspecified    Otalgia, unspecified    Other and unspecified hyperlipidemia    Palpitations    Personal history of venous thrombosis and embolism    Recurrent UTI    Rheumatoid arthritis(714.0)    deveshewar   Unspecified vitamin D deficiency    UTI (lower urinary tract infection) 11/28/2011   citrobacter       Past Surgical History:   Procedure Laterality Date   ORIF ULNAR FRACTURE     TYMPANOSTOMY TUBE PLACEMENT  2009   Right    Family History  Problem Relation Age of Onset   Dementia Father    Stroke Father    Hyperlipidemia Father    Diabetes Father    Parkinsonism Mother    Lymphoma Other    Colon cancer Neg Hx     Social History   Tobacco Use   Smoking status: Never   Smokeless tobacco: Never  Vaping Use   Vaping Use: Never used  Substance Use Topics   Alcohol use: No   Drug use: Never      Current Outpatient Medications:    alendronate (FOSAMAX) 70 MG tablet, TAKE 1 TABLET(70 MG) BY MOUTH 1 TIME A WEEK WITH A FULL GLASS OF WATER AND ON AN EMPTY STOMACH, Disp: 12 tablet, Rfl: 0   Calcium Carb-Cholecalciferol 600-800 MG-UNIT TABS, Take by mouth., Disp: , Rfl:    Cholecalciferol (VITAMIN D3) 2000 UNITS TABS, Take 1 tablet by mouth daily., Disp: , Rfl:    cycloSPORINE (RESTASIS) 0.05 % ophthalmic emulsion, Place 1 drop into both eyes daily., Disp: , Rfl:    Eflornithine HCl 13.9 % cream, Apply topically daily. , Disp: , Rfl:    folic acid (FOLVITE) 1 MG tablet, TAKE 2 TABLETS(2 MG) BY MOUTH DAILY, Disp: 180 tablet, Rfl: 3   hydroxychloroquine (  PLAQUENIL) 200 MG tablet, Take 1 tablet 200 mg BID Monday-Friday, Disp: 120 tablet, Rfl: 0   methotrexate (RHEUMATREX) 2.5 MG tablet, Take 8 tablets (20 mg total) by mouth once a week. Caution:Chemotherapy. Protect from light., Disp: 96 tablet, Rfl: 0   mineral/vitamin supplement (MULTIGEN) 70 MG TABS tablet, TAKE 1 TABLET BY MOUTH DAILY, Disp: 90 tablet, Rfl: 3   omeprazole (PRILOSEC) 20 MG capsule, Take 1 capsule (20 mg total) by mouth as needed., Disp: 30 capsule, Rfl: 3   SUMAtriptan (IMITREX) 50 MG tablet, TAKE ONE TABLET BY MOUTH AS NEEDED. MAY REPEAT IN 2 TO 4 HOURS IF NEEDED., Disp: 9 tablet, Rfl: 1   warfarin (COUMADIN) 2.5 MG tablet, TAKE 2 TABLETS BY MOUTH DAILY, EXCEPT ON MONDAY AND THURSDAY TAKE 1 TABLET. OR AS DIRECTED BY ANTICOAGULATION CLINIC  (Patient taking differently: TAKE 2 TABLETS BY MOUTH DAILY, EXCEPT ON SUNDAY AND THURSDAY TAKE 1 TABLET. OR AS DIRECTED BY ANTICOAGULATION CLINIC), Disp: 180 tablet, Rfl: 1   cefdinir (OMNICEF) 300 MG capsule, Take 1 capsule (300 mg total) by mouth 2 (two) times daily. For uti, Disp: 10 capsule, Rfl: 0  EXAM: BP Readings from Last 3 Encounters:  04/02/21 104/74  03/13/21 94/64  02/19/21 (!) 102/56    VITALS per patient if applicable:  GENERAL: alert, oriented, appears well and in no acute distress  HEENT: atraumatic, conjunttiva clear, no obvious abnormalities on inspection of external nose and ears  NECK: normal movements of the head and neck  LUNGS: on inspection no signs of respiratory distress, breathing rate appears normal, no obvious gross SOB, gasping or wheezing  CV: no obvious cyanosis  MS: moves all visible extremities without noticeable abnormality  PSYCH/NEURO: pleasant and cooperative, no obvious depression or anxiety, speech and thought processing grossly intact Lab Results  Component Value Date   WBC 8.0 03/22/2021   HGB 14.0 03/22/2021   HCT 43.5 03/22/2021   PLT 231.0 03/22/2021   GLUCOSE 101 (H) 03/22/2021   CHOL 194 09/13/2019   TRIG 139.0 09/13/2019   HDL 66.20 09/13/2019   LDLDIRECT 97.3 05/25/2013   LDLCALC 100 (H) 09/13/2019   ALT 14 03/22/2021   AST 18 03/22/2021   NA 138 03/22/2021   K 4.3 03/22/2021   CL 104 03/22/2021   CREATININE 0.80 03/22/2021   BUN 12 03/22/2021   CO2 26 03/22/2021   TSH 3.21 09/13/2019   INR 1.5 (A) 03/13/2021   HGBA1C 6.0 07/20/2018   UA +2 leukocytes positive blood.  Urine culture pending ASSESSMENT AND PLAN:  Discussed the following assessment and plan:    ICD-10-CM   1. Suspected UTI  R39.89     2. High risk medication use  Z79.899     3. Long term (current) use of anticoagulants  Z79.01      History of recurrent UTI (currently off of Macrobid prophylaxis given by gynI see may have helped some) Has  been reluctant to use topical vaginal estrogen because of the risk of clotting potential.  However reassured her the risk of clotting especially if she is on Coumadin is approaching can use it intermittently and may get some significant help with reduction of her UTI frequency. Began 710 there culture pending let us know if not improving before the weekend or as needed Discouraging Cipro as first-line based on potential for neuropathy and tendon rupture.  However can be used for complicated infection or resistant. Counseled.  She will check with GYN about topical vaginal estrogen use.  She will  go back on her Coumadin for now and discuss with Coumadin team. She has postponed her colonoscopy to get past this infection.  Questions answered as best about shingles vaccine and COVID booster timing as her RA flares up after COVID boosters.  Expectant management and discussion of plan and treatment with opportunity to ask questions and all were answered. The patient agreed with the plan and demonstrated an understanding of the instructions.   Advised to call back or seek an in-person evaluation if worsening  or having  further concerns  in interim. Return if symptoms worsen or fail to improve, for when planned.    Shanon Ace, MD

## 2021-04-02 NOTE — Telephone Encounter (Signed)
Pt has been added to schedule for today.

## 2021-04-03 ENCOUNTER — Telehealth: Payer: Self-pay

## 2021-04-03 NOTE — Telephone Encounter (Signed)
Had visit yesterday

## 2021-04-03 NOTE — Telephone Encounter (Signed)
Pt LVM reporting colonoscopy has been postponed to 06/03/21 due to her having a UTI. She reports she is also taking abx, cefdinir which she started yesterday. She reports she does not think this has interacted with her warfarin in the past. Pt is also taking OTC pyridium which does not interact with warfarin.   Pt requested a call back for dosing instructions since she did hold her coumadin for 3 days in preparation for the colonoscopy.   Normal range is 1.5-2 and dosing is 5mg  daily except take 2.5mg  on Sun and Thurs.  Advised pt to take 7.5 mg today and then return to normal dosing since she is also taking cefdinir, which can increase INR, even if it did not in the past for the pt. Pt requested to take the extra 2.5 mg tomorrow instead of today because she is already taking 5mg  today and only 2.5 mg tomorrow and she reports it may upset her stomach if she takes 7.5mg  at once. Advised it would be ok to take the extra 2.5mg  tomorrow. Advised if the abx changes to contact this nurse. Advised to keep apt for 11/23. Pt verbalized understanding.

## 2021-04-04 ENCOUNTER — Encounter: Payer: BC Managed Care – PPO | Admitting: Internal Medicine

## 2021-04-04 LAB — IRON,TIBC AND FERRITIN PANEL
%SAT: 31 % (calc) (ref 16–45)
Ferritin: 205 ng/mL (ref 16–288)
Iron: 90 ug/dL (ref 45–160)
TIBC: 287 mcg/dL (calc) (ref 250–450)

## 2021-04-04 LAB — COMPLETE METABOLIC PANEL WITH GFR
AG Ratio: 1.4 (calc) (ref 1.0–2.5)
ALT: 14 U/L (ref 6–29)
AST: 18 U/L (ref 10–35)
Albumin: 4.1 g/dL (ref 3.6–5.1)
Alkaline phosphatase (APISO): 75 U/L (ref 37–153)
BUN: 12 mg/dL (ref 7–25)
CO2: 26 mmol/L (ref 20–32)
Calcium: 9.3 mg/dL (ref 8.6–10.4)
Chloride: 104 mmol/L (ref 98–110)
Creat: 0.8 mg/dL (ref 0.50–1.05)
Globulin: 3 g/dL (calc) (ref 1.9–3.7)
Glucose, Bld: 101 mg/dL — ABNORMAL HIGH (ref 65–99)
Potassium: 4.3 mmol/L (ref 3.5–5.3)
Sodium: 138 mmol/L (ref 135–146)
Total Bilirubin: 0.7 mg/dL (ref 0.2–1.2)
Total Protein: 7.1 g/dL (ref 6.1–8.1)
eGFR: 83 mL/min/{1.73_m2} (ref >=60)

## 2021-04-04 LAB — HYDROXYCHLOROQUINE,BLOOD: HYDROXYCHLOROQUINE, (B): 630 ng/mL — ABNORMAL HIGH

## 2021-04-05 ENCOUNTER — Encounter: Payer: Self-pay | Admitting: Internal Medicine

## 2021-04-05 ENCOUNTER — Telehealth: Payer: Self-pay | Admitting: Internal Medicine

## 2021-04-05 NOTE — Telephone Encounter (Signed)
Pt is calling checking on the mychart message and also urine culture results. Pt thinks her abx needs to be change it is not working per pt

## 2021-04-05 NOTE — Telephone Encounter (Signed)
Redid instructions with new date and time and mailed to patient

## 2021-04-05 NOTE — Telephone Encounter (Signed)
Mychart message sent to Dr. Sarajane Jews in absence of PCP

## 2021-04-06 LAB — URINE CULTURE
MICRO NUMBER:: 12633390
SPECIMEN QUALITY:: ADEQUATE

## 2021-04-07 ENCOUNTER — Other Ambulatory Visit: Payer: Self-pay | Admitting: Internal Medicine

## 2021-04-07 MED ORDER — CEPHALEXIN 500 MG PO CAPS: 500.0000 mg | ORAL_CAPSULE | Freq: Four times a day (QID) | ORAL | 0 refills | Status: DC

## 2021-04-07 NOTE — Progress Notes (Signed)
Bacteria sensitive to medication given   see my chart message.   Sent in keflex  500 qid for 7 days  to  pharmacy.

## 2021-04-07 NOTE — Progress Notes (Signed)
See my chart message   citrobactor  pan sensitive. Changing to keflex for 7 days

## 2021-04-08 NOTE — Telephone Encounter (Signed)
Glad feeling better.  Yes  I with   keflex 3 x per day for 5 days instead of 4 x per day for 7 days .     Let us know how doing .

## 2021-04-08 NOTE — Telephone Encounter (Signed)
Noted  

## 2021-04-08 NOTE — Telephone Encounter (Signed)
Pt LVM reporting she has been changed to a different abx, keflex, and wants to know if there are any dose changes to her coumadin. Pt has apt with coumadin clinic on 11/23.     Contacted pt and advised no known interactions with keflex and no dosing adjustments with coumadin. Pt verbalized understanding and was appreciative for the call.

## 2021-04-10 ENCOUNTER — Ambulatory Visit (INDEPENDENT_AMBULATORY_CARE_PROVIDER_SITE_OTHER): Payer: BC Managed Care – PPO

## 2021-04-10 DIAGNOSIS — Z7901 Long term (current) use of anticoagulants: Secondary | ICD-10-CM

## 2021-04-10 LAB — POCT INR: INR: 1.6 — AB (ref 2.0–3.0)

## 2021-04-10 NOTE — Patient Instructions (Addendum)
Pre visit review using our clinic review tool, if applicable. No additional management support is needed unless otherwise documented below in the visit note.  Continue  5mg  daily except take 2.5mg  on Sun and Thurs. Recheck in 5 wks.

## 2021-04-10 NOTE — Progress Notes (Signed)
Continue  5mg  daily except take 2.5mg  on Sun and Thurs. Recheck in 5 wks.

## 2021-04-15 ENCOUNTER — Ambulatory Visit (INDEPENDENT_AMBULATORY_CARE_PROVIDER_SITE_OTHER): Payer: BC Managed Care – PPO | Admitting: Psychology

## 2021-04-15 DIAGNOSIS — F411 Generalized anxiety disorder: Secondary | ICD-10-CM | POA: Diagnosis not present

## 2021-04-15 NOTE — Telephone Encounter (Signed)
I will let Dr. Regis Bill handle this

## 2021-04-22 ENCOUNTER — Ambulatory Visit: Payer: BC Managed Care – PPO | Admitting: Psychology

## 2021-04-26 ENCOUNTER — Encounter: Payer: Self-pay | Admitting: Rheumatology

## 2021-04-26 ENCOUNTER — Other Ambulatory Visit: Payer: Self-pay | Admitting: *Deleted

## 2021-04-26 ENCOUNTER — Telehealth: Payer: Self-pay

## 2021-04-26 MED ORDER — HYDROXYCHLOROQUINE SULFATE 200 MG PO TABS: ORAL_TABLET | ORAL | 0 refills | Status: DC

## 2021-04-26 NOTE — Telephone Encounter (Signed)
Please advise patient to see her PCP as it could be an ear infection or lymph node infection.

## 2021-04-26 NOTE — Telephone Encounter (Signed)
My chart message received and forwarded to Dr. Estanislado Pandy for response.

## 2021-04-26 NOTE — Telephone Encounter (Signed)
Patient called to confirm we received the message she sent through [place].

## 2021-04-26 NOTE — Telephone Encounter (Signed)
Refill request received via fax  Next Visit: 06/19/2021  Last Visit: 03/13/2021  Labs: 03/22/2021 CBC is normal, CMP is normal  Eye exam: 08/08/2020    Current Dose per office note 03/13/2021: Plaquenil 200 mg by mouth daily  QF:[id number] arthritis of multiple sites with negative rheumatoid factor   Last Fill: 02/25/2021  Okay to refill Plaquenil?

## 2021-04-29 ENCOUNTER — Ambulatory Visit: Payer: BC Managed Care – PPO | Admitting: Family Medicine

## 2021-04-29 ENCOUNTER — Ambulatory Visit: Payer: BC Managed Care – PPO | Admitting: Psychology

## 2021-05-08 ENCOUNTER — Ambulatory Visit: Payer: BC Managed Care – PPO | Admitting: Psychology

## 2021-05-09 ENCOUNTER — Telehealth: Payer: BC Managed Care – PPO | Admitting: Internal Medicine

## 2021-05-15 ENCOUNTER — Ambulatory Visit (INDEPENDENT_AMBULATORY_CARE_PROVIDER_SITE_OTHER): Payer: BC Managed Care – PPO

## 2021-05-15 DIAGNOSIS — Z7901 Long term (current) use of anticoagulants: Secondary | ICD-10-CM

## 2021-05-15 LAB — POCT INR: INR: 1.5 — AB (ref 2.0–3.0)

## 2021-05-15 NOTE — Patient Instructions (Addendum)
Pre visit review using our clinic review tool, if applicable. No additional management support is needed unless otherwise documented below in the visit note.  Continue  5mg  daily except take 2.5mg  on Sun and Thurs. Recheck in 4 wks.

## 2021-05-15 NOTE — Progress Notes (Signed)
Continue  5mg  daily except take 2.5mg  on Sun and Thurs. Recheck in 4 wks.

## 2021-05-21 ENCOUNTER — Encounter: Payer: Self-pay | Admitting: Rheumatology

## 2021-05-22 ENCOUNTER — Telehealth (INDEPENDENT_AMBULATORY_CARE_PROVIDER_SITE_OTHER): Payer: BC Managed Care – PPO | Admitting: Internal Medicine

## 2021-05-22 ENCOUNTER — Ambulatory Visit: Payer: BC Managed Care – PPO | Admitting: Psychology

## 2021-05-22 ENCOUNTER — Ambulatory Visit: Payer: BC Managed Care – PPO | Admitting: Internal Medicine

## 2021-05-22 ENCOUNTER — Encounter: Payer: Self-pay | Admitting: Internal Medicine

## 2021-05-22 DIAGNOSIS — R3989 Other symptoms and signs involving the genitourinary system: Secondary | ICD-10-CM

## 2021-05-22 DIAGNOSIS — M0609 Rheumatoid arthritis without rheumatoid factor, multiple sites: Secondary | ICD-10-CM

## 2021-05-22 DIAGNOSIS — Z87440 Personal history of urinary (tract) infections: Secondary | ICD-10-CM | POA: Diagnosis not present

## 2021-05-22 DIAGNOSIS — Z789 Other specified health status: Secondary | ICD-10-CM

## 2021-05-22 DIAGNOSIS — F4322 Adjustment disorder with anxiety: Secondary | ICD-10-CM

## 2021-05-22 DIAGNOSIS — Z20822 Contact with and (suspected) exposure to covid-19: Secondary | ICD-10-CM | POA: Diagnosis not present

## 2021-05-22 DIAGNOSIS — Z79899 Other long term (current) drug therapy: Secondary | ICD-10-CM

## 2021-05-22 DIAGNOSIS — Z7901 Long term (current) use of anticoagulants: Secondary | ICD-10-CM

## 2021-05-22 MED ORDER — ALPRAZOLAM 0.25 MG PO TABS: 0.2500 mg | ORAL_TABLET | Freq: Two times a day (BID) | ORAL | 0 refills | Status: DC | PRN

## 2021-05-22 NOTE — Progress Notes (Signed)
Virtual Visit via Video Note  I connected with Jeslin Christo  on 05/22/21 at  9:30 AM EST by a video enabled telemedicine application and verified that I am speaking with the correct person using two identifiers. Location patient: home Location provider:work office Persons participating in the virtual visit: patient, provider  [place] national recommendations  regarding COVID 19 pandemic   video visit is advised over in office visit for this patient.  Patient aware  of the limitations of evaluation and management by telemedicine and  availability of in person appointments. and agreed to proceed.   HPI: Quinlyn Druck presents for video visit    Her son visiting from school tested positive for covid and had fever cough uri  came home dec 17 had doc visits ent Thursday had sem sx and then positive  h testing  pcr confrimed  was seen in urgen care cause of sx  had  he had covid booster the day before .  Isolating now in home  Patient denies current sx  has ? About  if gets infection.  Last COVID booster 3 weeks ago. She has no new sx and tested negative  . \Some anxiety cysts symptoms last few nights with palpitations at night but is doing better now.  Would like to have some alprazolam on hand if needed last prescription was expired no recent use. ROS: See pertinent positives and negatives per HPI.  Past Medical History:  Diagnosis Date   Adjustment disorder with anxiety    B12 deficiency due to diet    is a vegetarian   Chest pain, unspecified    Chronic anticoagulation    Fatty liver    Per patient   Headache(784.0)    migraines and head pain   Hx of varicella    As Child   Iron deficiency anemia, unspecified    Otalgia, unspecified    Other and unspecified hyperlipidemia    Palpitations    Personal history of venous thrombosis and embolism    Recurrent UTI    Rheumatoid arthritis(714.0)    deveshewar   Unspecified vitamin D deficiency    UTI (lower urinary tract  infection) 11/28/2011   citrobacter       Past Surgical History:  Procedure Laterality Date   ORIF ULNAR FRACTURE     TYMPANOSTOMY TUBE PLACEMENT  2009   Right    Family History  Problem Relation Age of Onset   Dementia Father    Stroke Father    Hyperlipidemia Father    Diabetes Father    Parkinsonism Mother    Lymphoma Other    Colon cancer Neg Hx     Social History   Tobacco Use   Smoking status: Never   Smokeless tobacco: Never  Vaping Use   Vaping Use: Never used  Substance Use Topics   Alcohol use: No   Drug use: Never      Current Outpatient Medications:    alendronate (FOSAMAX) 70 MG tablet, TAKE 1 TABLET(70 MG) BY MOUTH 1 TIME A WEEK WITH A FULL GLASS OF WATER AND ON AN EMPTY STOMACH, Disp: 12 tablet, Rfl: 0   Calcium Carb-Cholecalciferol 600-800 MG-UNIT TABS, Take by mouth., Disp: , Rfl:    Cholecalciferol (VITAMIN D3) 2000 UNITS TABS, Take 1 tablet by mouth daily., Disp: , Rfl:    cycloSPORINE (RESTASIS) 0.05 % ophthalmic emulsion, Place 1 drop into both eyes daily., Disp: , Rfl:    Eflornithine HCl 13.9 % cream, Apply topically daily. , Disp: ,  Rfl:    folic acid (FOLVITE) 1 MG tablet, TAKE 2 TABLETS(2 MG) BY MOUTH DAILY, Disp: 180 tablet, Rfl: 3   hydroxychloroquine (PLAQUENIL) 200 MG tablet, Take 1 tablet 200 mg BID Monday-Friday, Disp: 120 tablet, Rfl: 0   methotrexate (RHEUMATREX) 2.5 MG tablet, Take 8 tablets (20 mg total) by mouth once a week. Caution:Chemotherapy. Protect from light., Disp: 96 tablet, Rfl: 0   mineral/vitamin supplement (MULTIGEN) 70 MG TABS tablet, TAKE 1 TABLET BY MOUTH DAILY, Disp: 90 tablet, Rfl: 3   omeprazole (PRILOSEC) 20 MG capsule, Take 1 capsule (20 mg total) by mouth as needed., Disp: 30 capsule, Rfl: 3   SUMAtriptan (IMITREX) 50 MG tablet, TAKE ONE TABLET BY MOUTH AS NEEDED. MAY REPEAT IN 2 TO 4 HOURS IF NEEDED., Disp: 9 tablet, Rfl: 1   warfarin (COUMADIN) 2.5 MG tablet, TAKE 2 TABLETS BY MOUTH DAILY, EXCEPT ON MONDAY AND  THURSDAY TAKE 1 TABLET. OR AS DIRECTED BY ANTICOAGULATION CLINIC (Patient taking differently: TAKE 2 TABLETS BY MOUTH DAILY, EXCEPT ON SUNDAY AND THURSDAY TAKE 1 TABLET. OR AS DIRECTED BY ANTICOAGULATION CLINIC), Disp: 180 tablet, Rfl: 1   ALPRAZolam (XANAX) 0.25 MG tablet, Take 1 tablet (0.25 mg total) by mouth 2 (two) times daily as needed for anxiety., Disp: 20 tablet, Rfl: 0   cephALEXin (KEFLEX) 500 MG capsule, Take 1 capsule (500 mg total) by mouth 4 (four) times daily., Disp: 28 capsule, Rfl: 0  EXAM: BP Readings from Last 3 Encounters:  04/02/21 104/74  03/13/21 94/64  02/19/21 (!) 102/56    VITALS per patient if applicable:  GENERAL: alert, oriented, appears well and in no acute distress ocass ua cough ( she says is normal for her)   HEENT: atraumatic, conjunttiva clear, no obvious abnormalities on inspection of external nose and ears  NECK: normal movements of the head and neck  LUNGS: on inspection no signs of respiratory distress, breathing rate appears normal, no obvious gross SOB, gasping or wheezing  CV: no obvious cyanosis  MS: moves all visible extremities without noticeable abnormality  PSYCH/NEURO: pleasant and cooperative, no obvious depression or anxiety, speech and thought processing grossly intact Lab Results  Component Value Date   WBC 8.0 03/22/2021   HGB 14.0 03/22/2021   HCT 43.5 03/22/2021   PLT 231.0 03/22/2021   GLUCOSE 101 (H) 03/22/2021   CHOL 194 09/13/2019   TRIG 139.0 09/13/2019   HDL 66.20 09/13/2019   LDLDIRECT 97.3 05/25/2013   LDLCALC 100 (H) 09/13/2019   ALT 14 03/22/2021   AST 18 03/22/2021   NA 138 03/22/2021   K 4.3 03/22/2021   CL 104 03/22/2021   CREATININE 0.80 03/22/2021   BUN 12 03/22/2021   CO2 26 03/22/2021   TSH 3.21 09/13/2019   INR 1.5 (A) 05/15/2021   HGBA1C 6.0 07/20/2018    ASSESSMENT AND PLAN:  Discussed the following assessment and plan:    ICD-10-CM   1. Exposure to confirmed case of COVID-19  Z20.822      2. High risk medication use  Z79.899     3. History of recurrent UTIs  Z87.440     4. Suspected UTI  R39.89 Urinalysis    Urine Culture    5. Chronic anticoagulation  Z79.01     6. Adjustment disorder with anxiety  F43.22     7. Rheumatoid arthritis of multiple sites with negative rheumatoid factor (HCC)  M06.09     8. Medically complex patient  Z78.9      Exposure  to COVID in immunosuppressed state although doing well has tested negative so far advised to not test if she has no symptoms otherwise can do home testing and/or PCR her son is continuing to isolate before goes back to school. If she has symptoms and test positive she is a candidate for Paxlovid based on her autoimmune risk condition and normal renal function. Gfr 83  Contact med team if so . Should not need OV visit  unless alarming sx  red flag.   Reviewed at length ,plan  if she develops symptoms and positive test to contact us if it is the weekend can call the on-call service however pPaxlovid  can be ordered within 5 days of the onset of symptoms and it is 5 days next Monday. Can contact Medical team t on Friday even in my absence for further advice if she becomes positive and clinically symptomatic. Discussed prevention measures .  Rx small amount of alprazolam if needed for anxiety although is improved today. Future order placed for urine and urine culture in case she gets UTI symptoms again. Her colonoscopy is scheduled later in the month would wait until 5 to 7 days before to decide if this will proceed. Counseled.   Expectant management and discussion of plan and treatment with opportunity to ask questions and all were answered. The patient agreed with the plan and demonstrated an understanding of the instructions.   Advised to call back or seek an in-person evaluation if worsening  or having  further concerns  in interim. Return for prn depending  as indicated .    Shanon Ace, MD

## 2021-05-25 ENCOUNTER — Other Ambulatory Visit: Payer: Self-pay | Admitting: Internal Medicine

## 2021-06-02 ENCOUNTER — Other Ambulatory Visit: Payer: Self-pay | Admitting: Rheumatology

## 2021-06-03 ENCOUNTER — Encounter: Payer: BC Managed Care – PPO | Admitting: Internal Medicine

## 2021-06-03 NOTE — Telephone Encounter (Signed)
Next Visit: 06/19/2021   Last Visit: 03/13/2021   Labs: 03/22/2021 CBC is normal, CMP is normal  Current Dose per office note 03/13/2021:alendronate 70 mg p.o. weekly  Dx: Age related osteoporosis, unspecified pathological fracture presence   Last Fill: 03/13/2021  Okay to refill Fosamax?

## 2021-06-05 ENCOUNTER — Ambulatory Visit (INDEPENDENT_AMBULATORY_CARE_PROVIDER_SITE_OTHER): Payer: BC Managed Care – PPO | Admitting: Psychology

## 2021-06-05 DIAGNOSIS — F4322 Adjustment disorder with anxiety: Secondary | ICD-10-CM

## 2021-06-05 NOTE — Progress Notes (Addendum)
06/05/2021  Treatrment Plan:  Diagnosis F41.1 (Generalized anxiety disorder) [n/a]  Z62.820 (Parent child relationship problem) [n/a]  Symptoms Excessive and/or unrealistic worry that is difficult to control occurring more days than not for at least 6 months about a number of events or activities. (Status: maintained) -- No Description Entered  Regularly overindulges their child's wishes and demands. (Status: maintained) -- No Description Entered  Medication Status compliance  Safety none  If Suicidal or Homicidal State Action Taken: unspecified  Current Risk: low Medications unspecified Objectives Related Problem: Achieve a level of competent, effective parenting. Description: Increase the gradual letting go of their adolescent in constructive, affirmative ways. Target Date: 2021-07-15 Frequency: Daily Modality: individual Progress: 50%  Related Problem: Achieve a level of competent, effective parenting. Description: Identify unresolved childhood issues that affect parenting and work toward their resolution. Target Date: 2021-07-15 Frequency: Daily Modality: individual Progress: 30%  Related Problem: Achieve a level of competent, effective parenting. Description: Freely express feelings of frustration, helplessness, and inadequacy that each experiences in the parenting role. Target Date: 2021-07-15 Frequency: Daily Modality: individual Progress: 50%  Related Problem: Resolve the core conflict that is the source of anxiety. Description: Learn and implement problem-solving strategies for realistically addressing worries. Target Date: 2021-07-15 Frequency: Daily Modality: individual Progress: 60%  Related Problem: Resolve the core conflict that is the source of anxiety. Description: Learn and implement calming skills to reduce overall anxiety and manage anxiety symptoms. Target Date: 2021-07-15 Frequency: Daily Modality: individual Progress: 50%  Related Problem:  Resolve the core conflict that is the source of anxiety. Description: Describe situations, thoughts, feelings, and actions associated with anxieties and worries, their impact on functioning, and attempts to resolve them. Target Date: 2021-07-15 Frequency: Daily Modality: individual Progress: 60%  Client Response full compliance  Service Location Location, [address] [place], [place] [address]  Service Code cpt [phone]  Behavioral activation plan  Facilitate problem solving  Identify automatic thoughts  Emotion regulation skills  Provide education, information  Self care activities  Relaxation training  Lifestyle change (exercise, nutrition)  Self-monitoring  Validate/empathize  Session notes: F 41.1  Goals: She has chronic anxieties, mostly related to health concerns and her son's well-being. Would like to develop strategies to manage symptoms of anxiety. Needs to understand enmeshment and learn to define and manage appropriate family boundaries. Target date is 8-23. Patient has realized significant improvement in recognizing and establishing appropriate boundaries. Anxiety about health and family matters persist and she desires to continue treatment in an effort to manage and reduce these feelings. Target date is 8-23.  Meds: Xanax (.25 mg)  Patient agrees to a video session due to the Coronavirus. She is at home and I am in my home office.  [name] and [name]: He got Covid 19 for the first time just before New Years. He was moderately sick, but is now recovering. His extreme fear that Covid 10 would kill him is improved, but he remains very cautious. He took a test for an internship at [employer]. It highlighted for him how negatively he perceives the world. This was upsetting to both [name] and his mother.It concerns [name] that her son is so pessimistic. 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 He has a couple of more options that may result in an  interview. We talked about being genuine and not setting himself up for failure. [name] agrees and is very worried that [name] will put himself in another situation like that at his [employer] job. He  will consider looking at the "larger picture".        Krystal Morel, PhD Time: 3:10p-4:00p 50 min

## 2021-06-07 NOTE — Progress Notes (Signed)
Office Visit Note  Patient: [name]             Date of Birth: May 04, 1961           MRN: [id number]             PCP: Burnis Medin, MD Referring: Burnis Medin, MD Visit Date: 06/19/2021 Occupation: @GUAROCC @  Subjective:  Pain in right wrist and hand  History of Present Illness: Blake Goya is a 61 y.o. female with history of rheumatoid arthritis and osteoarthritis.  She states about a month ago she came off methotrexate for couple of weeks as she was exposed to COVID-19  She resumed methotrexate and then she stopped methotrexate again about 2 weeks ago for shingles vaccine.  She plans to resume methotrexate next Monday.  She states has been experiencing some pain and stiffness in her hands this week.  She also had some discomfort in the right wrist joint.  She has off-and-on discomfort in her knee joints.  None of the other joints are painful swollen.  Activities of Daily Living:  Patient reports morning stiffness for 4hours.   Patient Reports nocturnal pain.  Difficulty dressing/grooming: Denies Difficulty climbing stairs: Denies Difficulty getting out of chair: Denies Difficulty using hands for taps, buttons, cutlery, and/or writing: Reports  Review of Systems  Constitutional:  Positive for fatigue. Negative for weight loss.  HENT:  Positive for mouth dryness. Negative for mouth sores.   Eyes:  Positive for dryness.  Respiratory:  Negative for shortness of breath.   Cardiovascular:  Negative for chest pain and palpitations.  Gastrointestinal:  Negative for blood in stool, constipation and diarrhea.  Endocrine: Negative for increased urination.  Genitourinary:  Negative for involuntary urination.  Musculoskeletal:  Positive for joint pain, joint pain, joint swelling, myalgias, morning stiffness and myalgias.  Skin:  Negative for color change, rash and sensitivity to sunlight.  Allergic/Immunologic: Positive for susceptible to infections.  Neurological:   Negative for headaches.  Hematological:  Negative for swollen glands.  Psychiatric/Behavioral:  Negative for depressed mood and sleep disturbance. The patient is nervous/anxious.    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[place]) 01/06/2018   Long term (current) use of anticoagulants 02/11/2017   Sicca syndrome ([place]) 12/22/2016   ANA positive 12/22/2016   Raised intraocular pressure of both eyes 12/22/2016   Migraine 03/31/2016   Anxiety 03/31/2016   High risk medication use 03/31/2016   Primary osteoarthritis of both feet 03/31/2016   Primary osteoarthritis of both knees 03/31/2016   Stiffness of left hand joint 11/27/2014   Elevated IOP 06/09/2014   Long term current use of systemic steroids 06/09/2014   Medically complex patient 06/09/2014   Current use of steroid medication 01/09/2014   Xerosis of skin 12/07/2013   Encounter for therapeutic drug monitoring 06/09/2013   B12 deficiency 05/31/2013   Iron deficiency 05/31/2013   Visit for preventive health examination 05/25/2012   Mammogram abnormal 04/12/2012   Iliac crest  pain 01/23/2012   Vaginal atrophy 01/23/2012   History of recurrent UTI (urinary tract infection) 01/23/2012   Osteoporosis 04/29/2011   Nonspecific abnormal results of liver function study 12/08/2010   Chronic anticoagulation    Anticoagulant long-term use 07/15/2010   Anticardiolipin antibody positive 07/15/2010   Chronic cough 03/19/2009   CHEST PAIN-UNSPECIFIED 08/26/2008   PALPITATIONS, RECURRENT 07/24/2008   Vitamin D deficiency 05/30/2008   HYPERLIPIDEMIA 05/30/2008   IRON DEFICIENCY  05/30/2008   Adjustment disorder with anxiety 05/30/2008   Rheumatoid arthritis ([place]) 05/30/2008   PULMONARY EMBOLISM, HX OF 05/30/2008    Past Medical History:  Diagnosis Date   Adjustment disorder with anxiety    B12 deficiency due to diet    is a vegetarian   Chest pain, unspecified    Chronic  anticoagulation    Fatty liver    Per patient   Headache(784.0)    migraines and head pain   Hx of varicella    As Child   Iron deficiency anemia, unspecified    Otalgia, unspecified    Other and unspecified hyperlipidemia    Palpitations    Personal history of venous thrombosis and embolism    Recurrent UTI    Rheumatoid arthritis(714.0)    deveshewar   Unspecified vitamin D deficiency    UTI (lower urinary tract infection) 11/28/2011   citrobacter       Family History  Problem Relation Age of Onset   Dementia Father    Stroke Father    Hyperlipidemia Father    Diabetes Father    Parkinsonism Mother    Lymphoma Other    Colon cancer Neg Hx    Past Surgical History:  Procedure Laterality Date   ORIF ULNAR FRACTURE     TYMPANOSTOMY TUBE PLACEMENT  2009   Right   Social History   Social History Narrative   Regular Exercise-no   20 yrs in [place]   Son at [employer]   From [place].   Married Bryan  of 3    G1P1            Immunization History  Administered Date(s) Administered   Influenza,inj,Quad PF,6+ Mos 04/19/2013, 06/27/2015, 03/05/2016, 02/11/2017, 03/15/2018, 02/23/2019, 04/02/2020, 03/13/2021   PFIZER(Purple Top)SARS-COV-2 Vaccination 08/06/2019, 08/25/2019, 01/12/2020, 11/10/2020   PPD Test 07/21/2016   Tdap 05/31/2013   Zoster Recombinat (Shingrix) 01/23/2021, 06/11/2021     Objective: Vital Signs: BP 97/66 (BP Location: Right Arm, Patient Position: Sitting, Cuff Size: Large)    Pulse 62    Resp 12    Ht 5' 3.5" (1.613 m)    Wt 161 lb (73 kg)    BMI 28.07 kg/m    Physical Exam Vitals and nursing note reviewed.  Constitutional:      Appearance: She is well-developed.  HENT:     Head: Normocephalic and atraumatic.  Eyes:     Conjunctiva/sclera: Conjunctivae normal.  Cardiovascular:     Rate and Rhythm: Normal rate and regular rhythm.     Heart sounds: Normal heart sounds.  Pulmonary:     Effort: Pulmonary effort is normal.     Breath sounds: Normal  breath sounds.  Abdominal:     General: Bowel sounds are normal.     Palpations: Abdomen is soft.  Musculoskeletal:     Cervical back: Normal range of motion.  Lymphadenopathy:     Cervical: No cervical adenopathy.  Skin:    General: Skin is warm and dry.     Capillary Refill: Capillary refill takes less than 2 seconds.  Neurological:     Mental Status: She is alert and oriented to person, place, and time.  Psychiatric:        Behavior: Behavior normal.     Musculoskeletal Exam: C-spine was in range of motion.  Shoulder joints, elbow joints, wrist joints, MCPs PIPs and DIPs been good range of motion with no synovitis.  Bilateral hip joints, knee joints and ankle joints with good range of  motion with no synovitis.  There was no tenderness over MTPs.  CDAI Exam: CDAI Score: 0.6  Patient Global: 3 mm; Provider Global: 3 mm Swollen: 0 ; Tender: 0  Joint Exam 06/19/2021   No joint exam has been documented for this visit     Investigation: No additional findings.  Imaging: No results found.  Recent Labs: Lab Results  Component Value Date   WBC 8.0 03/22/2021   HGB 14.0 03/22/2021   PLT 231.0 03/22/2021   NA 138 03/22/2021   K 4.3 03/22/2021   CL 104 03/22/2021   CO2 26 03/22/2021   GLUCOSE 101 (H) 03/22/2021   BUN 12 03/22/2021   CREATININE 0.80 03/22/2021   BILITOT 0.7 03/22/2021   ALKPHOS 60 08/29/2020   AST 18 03/22/2021   ALT 14 03/22/2021   PROT 7.1 03/22/2021   ALBUMIN 3.7 08/29/2020   CALCIUM 9.3 03/22/2021   GFRAA 96 07/26/2020   QFTBGOLDPLUS NEGATIVE 09/12/2020    Speciality Comments: PLQ Eye Exam: 08/08/2020 WNL @ [facility] Follow up 6 months  Fosamax 11/21  Procedures:  No procedures performed Allergies: Boniva [ibandronate sodium], Ivp dye [iodinated contrast media], Other, Risedronate sodium, Risedronate sodium [risedronate sodium], and Macrobid [nitrofurantoin]   Assessment / Plan:     Visit Diagnoses: Rheumatoid arthritis 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  Patient states she is having a mild flare as she has been off methotrexate for 2 weeks now.  She had shingles vaccine.  She plans to resume methotrexate next week.  She also missed 2 weeks of methotrexate about a month ago as she was exposed to COVID-19.  She never developed symptoms of COVID-19 infection.  She states she tested herself several times and she was negative.  She has been experiencing morning stiffness and discomfort in her right wrist and right fifth MCP joint.  No synovitis was noted.  She did not have tenderness on palpation.  High risk medication use - Plaquenil 200 mg by mouth daily, Methotrexate 8 tablets by mouth every week, and folic acid 2 mg by mouth daily.PLQ Eye Exam: 08/08/2020.-Labs obtained on March 22, 2021 were within normal limits which included CBC with differential and CMP with GFR.  Plan: CBC with Differential/Platelet, COMPLETE METABOLIC PANEL WITH GFR today and then every 3 months to monitor for drug toxicity.  Information regarding immunization was placed in the AVS.  She was also advised to hold methotrexate in case she develops an infection resume after the infection resolves.  Sicca syndrome (HCC)-over-the-counter products were discussed.  Pain in both hands-she complains of discomfort in her bilateral hands.  No synovitis was noted.  She had tenderness over right wrist joint and right fifth MCP.  She also has some underlying osteoarthritis which cause discomfort.  Primary osteoarthritis of both knees-she has off-and-on discomfort in the joints.  No warmth swelling or effusion was noted.  Primary osteoarthritis of both feet-she complains of discomfort in her feet.  No synovitis was noted.  Age related osteoporosis, unspecified pathological fracture presence - 02/02/20 T-score DualFemur Neck Left  02/02/2020 T score  -2.8, BMD 0.649 g/cm2. alendronate 70 mg p.o.  weekly.  She has been tolerating alendronate without any side effects.  Use of calcium rich diet vitamin D was also discussed.  Vitamin D level was normal few years back.  Need for regular exercise was emphasized.  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  History of anemia  History of migraine  History of hyperlipidemia -she has history of hyperlipidemia.  Increased risk of heart disease with the rheumatoid arthritis was discussed.  Dietary modifications and exercise were discussed.  Plan: Lipid panel  Frequent UTI  Orders: Orders Placed This Encounter  Procedures   CBC with Differential/Platelet   COMPLETE METABOLIC PANEL WITH GFR   Lipid panel   No orders of the defined types were placed in this encounter.    Follow-Up Instructions: Return in about 5 months (around 11/16/2021) for Rheumatoid arthritis, Osteoarthritis, Osteoporosis.   Bo Merino, MD  Note - This record has been created using Editor, commissioning.  Chart creation errors have been sought, but may not always  have been located. Such creation errors do not reflect on  the standard of medical care.

## 2021-06-10 ENCOUNTER — Ambulatory Visit: Payer: BC Managed Care – PPO

## 2021-06-10 ENCOUNTER — Ambulatory Visit (INDEPENDENT_AMBULATORY_CARE_PROVIDER_SITE_OTHER): Payer: BC Managed Care – PPO

## 2021-06-10 DIAGNOSIS — Z7901 Long term (current) use of anticoagulants: Secondary | ICD-10-CM

## 2021-06-10 LAB — POCT INR: INR: 1.4 — AB (ref 2.0–3.0)

## 2021-06-10 MED ORDER — WARFARIN SODIUM 2.5 MG PO TABS: ORAL_TABLET | ORAL | 1 refills | Status: DC

## 2021-06-10 NOTE — Progress Notes (Signed)
Increase dose today to take 7.5 mg and the continue  5mg  daily except take 2.5mg  on Sun and Thurs. Recheck in 4 wks.

## 2021-06-10 NOTE — Patient Instructions (Addendum)
Pre visit review using our clinic review tool, if applicable. No additional management support is needed unless otherwise documented below in the visit note.  Increase dose today to take 7.5 mg and the continue  5mg  daily except take 2.5mg  on Sun and Thurs. Recheck in 4 wks.

## 2021-06-11 ENCOUNTER — Ambulatory Visit (INDEPENDENT_AMBULATORY_CARE_PROVIDER_SITE_OTHER): Payer: BC Managed Care – PPO

## 2021-06-11 DIAGNOSIS — Z23 Encounter for immunization: Secondary | ICD-10-CM | POA: Diagnosis not present

## 2021-06-13 ENCOUNTER — Ambulatory Visit: Payer: BC Managed Care – PPO

## 2021-06-17 ENCOUNTER — Ambulatory Visit (INDEPENDENT_AMBULATORY_CARE_PROVIDER_SITE_OTHER): Payer: BC Managed Care – PPO | Admitting: Psychology

## 2021-06-17 DIAGNOSIS — F4322 Adjustment disorder with anxiety: Secondary | ICD-10-CM

## 2021-06-17 NOTE — Progress Notes (Addendum)
06/17/2021  Treatrment Plan:  Diagnosis F41.1 (Generalized anxiety disorder) [n/a]  Z62.820 (Parent child relationship problem) [n/a]  Symptoms Excessive and/or unrealistic worry that is difficult to control occurring more days than not for at least 6 months about a number of events or activities. (Status: maintained) -- No Description Entered  Regularly overindulges their child's wishes and demands. (Status: maintained) -- No Description Entered  Medication Status compliance  Safety none  If Suicidal or Homicidal State Action Taken: unspecified  Current Risk: low Medications unspecified Objectives Related Problem: Achieve a level of competent, effective parenting. Description: Increase the gradual letting go of their adolescent in constructive, affirmative ways. Target Date: 2022-01-12 Frequency: Daily Modality: individual Progress: 50%  Related Problem: Achieve a level of competent, effective parenting. Description: Identify unresolved childhood issues that affect parenting and work toward their resolution. Target Date: 2022-01-12 Frequency: Daily Modality: individual Progress: 30%  Related Problem: Achieve a level of competent, effective parenting. Description: Freely express feelings of frustration, helplessness, and inadequacy that each experiences in the parenting role. Target Date: 2022-01-12 Frequency: Daily Modality: individual Progress: 50%  Related Problem: Resolve the core conflict that is the source of anxiety. Description: Learn and implement problem-solving strategies for realistically addressing worries. Target Date: 2022-01-12 Frequency: Daily Modality: individual Progress: 60%  Related Problem: Resolve the core conflict that is the source of anxiety. Description: Learn and implement calming skills to reduce overall anxiety and manage anxiety symptoms. Target Date: 2022-01-12 Frequency: Daily Modality: individual Progress: 50%  Related Problem:  Resolve the core conflict that is the source of anxiety. Description: Describe situations, thoughts, feelings, and actions associated with anxieties and worries, their impact on functioning, and attempts to resolve them. Target Date: 2022-01-12 Frequency: Daily Modality: individual Progress: 60%  Client Response full compliance  Service Location Location, [address] [place], [place] [address]  Service Code cpt 2245821757  Behavioral activation plan  Facilitate problem solving  Identify automatic thoughts  Emotion regulation skills  Provide education, information  Self care activities  Relaxation training  Lifestyle change (exercise, nutrition)  Self-monitoring  Validate/empathize  Session notes: F 41.1  Goals: She has chronic anxieties, mostly related to health concerns and her son's well-being. Would like to develop strategies to manage symptoms of anxiety. Needs to understand enmeshment and learn to define and manage appropriate family boundaries. Target date is 8-23. Patient has realized significant improvement in recognizing and establishing appropriate boundaries. Anxiety about health and family matters persist and she desires to continue treatment in an effort to manage and reduce these feelings. Target date is 8-23.  Meds: Xanax (.25 mg)  Patient agrees to a video session due to the Coronavirus. She is at home and I am in my home office.  Anquanette and [name]: She says she and [name] had a disagreement she would like to discuss. [name] says he has some projects to do and he, in typical fashion, gets overly involved in preparing the project. These are group assignments and takes on the project himself. He doesn't like group projects in that he takes over and does it himself "to make it better". Ivi worries about his perspective and is not sure how to convey her distress about her son's perspective.She is most concerned about his physical health. He talked about his  unwillingness to accept a less than perfect product. She is distraught that her son feels trapped into a mode of living that is soley focused on work. We agreed to meet soon before the semester goes too  far so we can redefine a strategy for their communication and his approach to his work.     Marcelina Morel, PhD 3:10-4:00p 50 min

## 2021-06-19 ENCOUNTER — Encounter: Payer: Self-pay | Admitting: Rheumatology

## 2021-06-19 ENCOUNTER — Ambulatory Visit (INDEPENDENT_AMBULATORY_CARE_PROVIDER_SITE_OTHER): Payer: BC Managed Care – PPO | Admitting: Rheumatology

## 2021-06-19 ENCOUNTER — Other Ambulatory Visit: Payer: Self-pay

## 2021-06-19 VITALS — BP 97/66 | HR 62 | Resp 12 | Ht 63.5 in | Wt 161.0 lb

## 2021-06-19 DIAGNOSIS — M810 Age-related osteoporosis without current pathological fracture: Secondary | ICD-10-CM

## 2021-06-19 DIAGNOSIS — M0609 Rheumatoid arthritis without rheumatoid factor, multiple sites: Secondary | ICD-10-CM

## 2021-06-19 DIAGNOSIS — M19071 Primary osteoarthritis, right ankle and foot: Secondary | ICD-10-CM

## 2021-06-19 DIAGNOSIS — Z79899 Other long term (current) drug therapy: Secondary | ICD-10-CM

## 2021-06-19 DIAGNOSIS — M3500 Sicca syndrome, unspecified: Secondary | ICD-10-CM | POA: Diagnosis not present

## 2021-06-19 DIAGNOSIS — M19072 Primary osteoarthritis, left ankle and foot: Secondary | ICD-10-CM

## 2021-06-19 DIAGNOSIS — M79641 Pain in right hand: Secondary | ICD-10-CM | POA: Diagnosis not present

## 2021-06-19 DIAGNOSIS — K760 Fatty (change of) liver, not elsewhere classified: Secondary | ICD-10-CM

## 2021-06-19 DIAGNOSIS — M79642 Pain in left hand: Secondary | ICD-10-CM

## 2021-06-19 DIAGNOSIS — N390 Urinary tract infection, site not specified: Secondary | ICD-10-CM

## 2021-06-19 DIAGNOSIS — Z8669 Personal history of other diseases of the nervous system and sense organs: Secondary | ICD-10-CM

## 2021-06-19 DIAGNOSIS — Z862 Personal history of diseases of the blood and blood-forming organs and certain disorders involving the immune mechanism: Secondary | ICD-10-CM

## 2021-06-19 DIAGNOSIS — H40053 Ocular hypertension, bilateral: Secondary | ICD-10-CM

## 2021-06-19 DIAGNOSIS — M170 Bilateral primary osteoarthritis of knee: Secondary | ICD-10-CM

## 2021-06-19 DIAGNOSIS — Z86711 Personal history of pulmonary embolism: Secondary | ICD-10-CM

## 2021-06-19 DIAGNOSIS — Z8639 Personal history of other endocrine, nutritional and metabolic disease: Secondary | ICD-10-CM

## 2021-06-19 LAB — COMPLETE METABOLIC PANEL WITH GFR
AG Ratio: 1.4 (calc) (ref 1.0–2.5)
ALT: 10 U/L (ref 6–29)
AST: 16 U/L (ref 10–35)
Albumin: 4.2 g/dL (ref 3.6–5.1)
Alkaline phosphatase (APISO): 71 U/L (ref 37–153)
BUN: 8 mg/dL (ref 7–25)
CO2: 27 mmol/L (ref 20–32)
Calcium: 9.1 mg/dL (ref 8.6–10.4)
Chloride: 104 mmol/L (ref 98–110)
Creat: 0.78 mg/dL (ref 0.50–1.05)
Globulin: 3 g/dL (calc) (ref 1.9–3.7)
Glucose, Bld: 85 mg/dL (ref 65–99)
Potassium: 4.2 mmol/L (ref 3.5–5.3)
Sodium: 139 mmol/L (ref 135–146)
Total Bilirubin: 0.7 mg/dL (ref 0.2–1.2)
Total Protein: 7.2 g/dL (ref 6.1–8.1)
eGFR: 86 mL/min/{1.73_m2} (ref >=60)

## 2021-06-19 LAB — CBC WITH DIFFERENTIAL/PLATELET
Absolute Monocytes: 536 cells/uL (ref 200–950)
Basophils Absolute: 43 cells/uL (ref 0–200)
Basophils Relative: 0.5 %
Eosinophils Absolute: 128 cells/uL (ref 15–500)
Eosinophils Relative: 1.5 %
HCT: 44.1 % (ref 35.0–45.0)
Hemoglobin: 14.4 g/dL (ref 11.7–15.5)
Lymphs Abs: 2967 cells/uL (ref 850–3900)
MCH: 27.4 pg (ref 27.0–33.0)
MCHC: 32.7 g/dL (ref 32.0–36.0)
MCV: 83.8 fL (ref 80.0–100.0)
MPV: 11.6 fL (ref 7.5–12.5)
Monocytes Relative: 6.3 %
Neutro Abs: 4828 cells/uL (ref 1500–7800)
Neutrophils Relative %: 56.8 %
Platelets: 278 10*3/uL (ref 140–400)
RBC: 5.26 10*6/uL — ABNORMAL HIGH (ref 3.80–5.10)
RDW: 12.6 % (ref 11.0–15.0)
Total Lymphocyte: 34.9 %
WBC: 8.5 10*3/uL (ref 3.8–10.8)

## 2021-06-19 LAB — LIPID PANEL
Cholesterol: 192 mg/dL (ref <200)
HDL: 67 mg/dL (ref >=50)
LDL Cholesterol (Calc): 101 mg/dL (calc) — ABNORMAL HIGH
Non-HDL Cholesterol (Calc): 125 mg/dL (calc) (ref <130)
Total CHOL/HDL Ratio: 2.9 (calc) (ref <5.0)
Triglycerides: 144 mg/dL (ref <150)

## 2021-06-19 NOTE — Patient Instructions (Signed)
Standing Labs We placed an order today for your standing lab work.   Please have your standing labs drawn in May and every 3 months  If possible, please have your labs drawn 2 weeks prior to your appointment so that the provider can discuss your results at your appointment.  Please note that you may see your imaging and lab results in [facility] before we have reviewed them. We may be awaiting multiple results to interpret others before contacting you. Please allow our office up to 72 hours to thoroughly review all of the results before contacting the office for clarification of your results.  We have open lab daily: Monday through Thursday from 1:30-4:30 PM and Friday from 1:30-4:00 PM at the office of Dr. Bo Merino, [facility].   Please be advised, all patients with office appointments requiring lab work will take precedent over walk-in lab work.  If possible, please come for your lab work on Monday and Friday afternoons, as you may experience shorter wait times. The office is located at [address], [place], [place], [place] [address] No appointment is necessary.   Labs are drawn by [facility]. Please bring your co-pay at the time of your lab draw.  You may receive a bill from [facility] for your lab work.  Please note if you are on Hydroxychloroquine and and an order has been placed for a Hydroxychloroquine level, you will need to have it drawn 4 hours or more after your last dose.  If you wish to have your labs drawn at another location, please call the office 24 hours in advance to send orders.  If you have any questions regarding directions or hours of operation,  please call [phone].   As a reminder, please drink plenty of water prior to coming for your lab work. Thanks!   Vaccines You are taking a medication(s) that can suppress your immune system.  The following immunizations are recommended: Flu annually Covid-19  Td/Tdap (tetanus, diphtheria, pertussis)  every 10 years Pneumonia (Prevnar 15 then Pneumovax 23 at least 1 year apart.  Alternatively, can take Prevnar 20 without needing additional dose) Shingrix: 2 doses from 4 weeks to 6 months apart  Please check with your PCP to make sure you are up to date.   If you have signs or symptoms of an infection or start antibiotics: First, call your PCP for workup of your infection. Hold your medication through the infection, until you complete your antibiotics, and until symptoms resolve if you take the following: Injectable medication (Actemra, Benlysta, Cimzia, Cosentyx, Enbrel, Humira, Kevzara, Orencia, Remicade, Simponi, Stelara, Taltz, Tremfya) Methotrexate Leflunomide (Arava) Mycophenolate (Cellcept) Morrie Sheldon, Olumiant, or Rinvoq

## 2021-07-01 ENCOUNTER — Ambulatory Visit (INDEPENDENT_AMBULATORY_CARE_PROVIDER_SITE_OTHER): Payer: BC Managed Care – PPO | Admitting: Psychology

## 2021-07-01 DIAGNOSIS — F4322 Adjustment disorder with anxiety: Secondary | ICD-10-CM | POA: Diagnosis not present

## 2021-07-01 NOTE — Progress Notes (Signed)
07/01/2021  Treatrment Plan:  Diagnosis F41.1 (Generalized anxiety disorder) [n/a]  Z62.820 (Parent child relationship problem) [n/a]  Symptoms Excessive and/or unrealistic worry that is difficult to control occurring more days than not for at least 6 months about a number of events or activities. (Status: maintained) -- No Description Entered  Regularly overindulges their child's wishes and demands. (Status: maintained) -- No Description Entered  Medication Status compliance  Safety none  If Suicidal or Homicidal State Action Taken: unspecified  Current Risk: low Medications unspecified Objectives Related Problem: Achieve a level of competent, effective parenting. Description: Increase the gradual letting go of their adolescent in constructive, affirmative ways. Target Date: 2022-01-12 Frequency: Daily Modality: individual Progress: 50%  Related Problem: Achieve a level of competent, effective parenting. Description: Identify unresolved childhood issues that affect parenting and work toward their resolution. Target Date: 2022-01-12 Frequency: Daily Modality: individual Progress: 30%  Related Problem: Achieve a level of competent, effective parenting. Description: Freely express feelings of frustration, helplessness, and inadequacy that each experiences in the parenting role. Target Date: 2022-01-12 Frequency: Daily Modality: individual Progress: 50%  Related Problem: Resolve the core conflict that is the source of anxiety. Description: Learn and implement problem-solving strategies for realistically addressing worries. Target Date: 2022-01-12 Frequency: Daily Modality: individual Progress: 60%  Related Problem: Resolve the core conflict that is the source of anxiety. Description: Learn and implement calming skills to reduce overall anxiety and manage anxiety symptoms. Target Date: 2022-01-12 Frequency: Daily Modality: individual Progress:  50%  Related Problem: Resolve the core conflict that is the source of anxiety. Description: Describe situations, thoughts, feelings, and actions associated with anxieties and worries, their impact on functioning, and attempts to resolve them. Target Date: 2022-01-12 Frequency: Daily Modality: individual Progress: 60%  Client Response full compliance  Service Location Location, [address] [place], [place] [address]  Service Code cpt [id number]  Behavioral activation plan  Facilitate problem solving  Identify automatic thoughts  Emotion regulation skills  Provide education, information  Self care activities  Relaxation training  Lifestyle change (exercise, nutrition)  Self-monitoring  Validate/empathize  Session notes: F 41.1  Goals: She has chronic anxieties, mostly related to health concerns and her son's well-being. Would like to develop strategies to manage symptoms of anxiety. Needs to understand enmeshment and learn to define and manage appropriate family boundaries. Target date is 8-23. Patient has realized significant improvement in recognizing and establishing appropriate boundaries. Anxiety about health and family matters persist and she desires to continue treatment in an effort to manage and reduce these feelings. Target date is 8-23.  Meds: Xanax (.25 mg)  Patient agrees to a video session due to the Coronavirus. She is at home and I am in my home office.  Darene and [name]: [name] says that he has had a group project and again took on most of the responsibility. Alveena says that it is so upsetting to her to see her son's inability to change his approach to work and school. Worries he will never create a normal life for himself. He admits that he has not made any changes in his approach over these many years of talking about this issue. He also admits that he approaches all taks by just "counting down the days". He is not really gratified by any of his endeavors and  his satisfaction with the result is always fleeting. [name] recognizes that he does not allocate any time to  do anything else, including looking for work. Bella tells him "you will be miserable in your life unless you change". She feels a sense of desperation for her son. We talked about her need to accept the decision [name] makes about the trajectory of his life. He evaluates all tasks as "is this worth my time"? He thinks he is a low energy person and that his productivity is generally low. We talked about his tendency to compare tasks on the dimension of which is most important. Instead, goal needs to be thinking of the value of being able to divide his time.        Marcelina Morel, PhD 3:10-4:00p 50 min

## 2021-07-05 ENCOUNTER — Ambulatory Visit (AMBULATORY_SURGERY_CENTER): Payer: BC Managed Care – PPO | Admitting: *Deleted

## 2021-07-05 ENCOUNTER — Other Ambulatory Visit: Payer: Self-pay

## 2021-07-05 VITALS — Ht 63.5 in | Wt 161.0 lb

## 2021-07-05 DIAGNOSIS — Z1211 Encounter for screening for malignant neoplasm of colon: Secondary | ICD-10-CM

## 2021-07-05 NOTE — Progress Notes (Signed)

## 2021-07-08 ENCOUNTER — Telehealth: Payer: Self-pay

## 2021-07-08 NOTE — Telephone Encounter (Signed)
Pt LVM reporting her colonoscopy has been RS for 3/10 and will need dosing instructions for around the procedure when she comes in for INR check on 2/22.  Pt has a coumadin clinic apt on 2/22.  Agree no lovenox bridge is needed, but will discuss with pt. Pt should hold coumadin for 5 days. Will discuss instructions for hold at pts apt tomorrow.     Schedule for pre and post colonoscopy for coumadin dosing.   3/5: Take last dose of coumadin 3/6: NO coumadin 3/7: NO coumadin 3/8: NO coumadin 3/9: NO coumadin   3/10: Colonoscopy, NO coumadin   3/11: Take 7.5 mg coumadin (3 tablets) 3/12: Take 3.75 mg coumadin (1 1/2 tablets) 3/13: Take 7.5 mg coumadin (3 tablets) 3/14: Take 7.5 mg coumadin (3 tablets)   3/15: Recheck INR, hold coumadin until recheck

## 2021-07-10 ENCOUNTER — Ambulatory Visit (INDEPENDENT_AMBULATORY_CARE_PROVIDER_SITE_OTHER): Payer: BC Managed Care – PPO

## 2021-07-10 DIAGNOSIS — Z7901 Long term (current) use of anticoagulants: Secondary | ICD-10-CM | POA: Diagnosis not present

## 2021-07-10 LAB — POCT INR: INR: 1.4 — AB (ref 2.0–3.0)

## 2021-07-10 NOTE — Progress Notes (Signed)
Pt reports her colonoscopy has been RS for 3/3.   Dosing schedule below  2/25: Take last dose of coumadin 2/26 NO coumadin 2/27: NO coumadin 2/28: NO coumadin 3/1: NO coumadin 3/2: NO coumadin   3/3: Colonoscopy, NO coumadin   3/4: Take 7.5 mg coumadin (3 tablets) 3/5: Take 3.75 mg coumadin (1 1/2 tablets) 3/6: Take 7.5 mg coumadin (3 tablets) 3/7: Take 7.5 mg coumadin (3 tablets)   3/8: Recheck INR, hold coumadin until recheck

## 2021-07-10 NOTE — Patient Instructions (Addendum)
Pre visit review using our clinic review tool, if applicable. No additional management support is needed unless otherwise documented below in the visit note.  Increase dose today to take 3 tablets and the continue 2 tablets daily except take 1 tablet on Sun and Thurs. Follow instructions for dosing around procedure. Recheck on 3/8. Dosing schedule for colonoscopy 2/25: Take last dose of coumadin 2/26 NO coumadin 2/27: NO coumadin 2/28: NO coumadin 3/1: NO coumadin 3/2: NO coumadin   3/3: Colonoscopy, NO coumadin   3/4: Take 7.5 mg coumadin (3 tablets) 3/5: Take 3.75 mg coumadin (1 1/2 tablets) 3/6: Take 7.5 mg coumadin (3 tablets) 3/7: Take 7.5 mg coumadin (3 tablets)   3/8: Recheck INR, hold coumadin until recheck

## 2021-07-15 ENCOUNTER — Ambulatory Visit: Payer: BC Managed Care – PPO | Admitting: Psychology

## 2021-07-15 ENCOUNTER — Telehealth (INDEPENDENT_AMBULATORY_CARE_PROVIDER_SITE_OTHER): Payer: BC Managed Care – PPO | Admitting: Internal Medicine

## 2021-07-15 DIAGNOSIS — R300 Dysuria: Secondary | ICD-10-CM

## 2021-07-15 NOTE — Telephone Encounter (Signed)
Patient called to see if she can just come in for lab appointment to do urinanalysis or if she needs a whole new appointment with Dr.Panosh. Patient stated that when she got order for UA she was having discomfort, the discomfort went away so she never came in for the lab but it has now come back. Patient has colonoscopy schedule for Friday March 3rd and she wants to be sure everything is okay before then. She would like a call back to know if she can come tomorrow to do the lab work in order to have results back in time    Please advise

## 2021-07-15 NOTE — Telephone Encounter (Signed)
I spoke with the pt and she has been added to lab schedule for tomorrow. Pt informed of the message and verbalized understanding.

## 2021-07-15 NOTE — Addendum Note (Signed)
Addended by: Nilda Riggs on: 07/15/2021 05:01 PM   Modules accepted: Orders

## 2021-07-15 NOTE — Telephone Encounter (Signed)
It appears that the orders for urinalysis and culture are still in the system. She can come tomorrow and get these done however would also add a point-of-care urinalysis so that we get a screening test back right away.

## 2021-07-16 ENCOUNTER — Ambulatory Visit (INDEPENDENT_AMBULATORY_CARE_PROVIDER_SITE_OTHER): Payer: BC Managed Care – PPO | Admitting: Internal Medicine

## 2021-07-16 ENCOUNTER — Other Ambulatory Visit (INDEPENDENT_AMBULATORY_CARE_PROVIDER_SITE_OTHER): Payer: BC Managed Care – PPO

## 2021-07-16 ENCOUNTER — Encounter: Payer: Self-pay | Admitting: Internal Medicine

## 2021-07-16 VITALS — BP 114/70 | HR 64 | Temp 97.9°F | Ht 63.5 in | Wt 161.4 lb

## 2021-07-16 DIAGNOSIS — Z87440 Personal history of urinary (tract) infections: Secondary | ICD-10-CM

## 2021-07-16 DIAGNOSIS — R3989 Other symptoms and signs involving the genitourinary system: Secondary | ICD-10-CM | POA: Diagnosis not present

## 2021-07-16 DIAGNOSIS — R399 Unspecified symptoms and signs involving the genitourinary system: Secondary | ICD-10-CM

## 2021-07-16 DIAGNOSIS — Z79899 Other long term (current) drug therapy: Secondary | ICD-10-CM

## 2021-07-16 LAB — URINALYSIS, ROUTINE W REFLEX MICROSCOPIC
Bilirubin Urine: NEGATIVE
Ketones, ur: NEGATIVE
Nitrite: NEGATIVE
Specific Gravity, Urine: <=1.005 — AB (ref 1.000–1.030)
Total Protein, Urine: NEGATIVE
Urine Glucose: NEGATIVE
Urobilinogen, UA: 0.2 (ref 0.0–1.0)
pH: 6 (ref 5.0–8.0)

## 2021-07-16 LAB — POCT URINALYSIS DIPSTICK
Bilirubin, UA: NEGATIVE
Blood, UA: NEGATIVE
Glucose, UA: NEGATIVE
Ketones, UA: NEGATIVE
Nitrite, UA: NEGATIVE
Protein, UA: NEGATIVE
Spec Grav, UA: 1.015 (ref 1.010–1.025)
Urobilinogen, UA: 0.2 E.U./dL
pH, UA: 6 (ref 5.0–8.0)

## 2021-07-16 MED ORDER — CEPHALEXIN 500 MG PO CAPS
500.0000 mg | ORAL_CAPSULE | Freq: Three times a day (TID) | ORAL | 0 refills | Status: DC
Start: 2021-07-16 — End: 2021-07-19

## 2021-07-16 NOTE — Progress Notes (Signed)
Chief Complaint  Patient presents with   Urinary Tract Infection    HPI: [name] 61 y.o. come in for   recurrent sx poss another uti.... but veru mild sx and not sure  if  has one   Sx off and on   flash of pain at times but not typical    and doesn't feel like uti but having colon tend of week    Not that much   but now  a little more    Not all the time.   Very mild . Worries about lower abd  sx  hasn't begun the topical estrogen  ( suppository)      not painful urination  "not there yet. " Took kelfex last time and better but still has some type of sx off and on .  Next gyne appt  got delayed .  Uro advised estrogen and PT but she is hesitant because "epidemic is ot over " asks about daily antibiotic suppression   .   ROS: See pertinent positives and negatives per HPI. Minor cough  feels from gerd   never got covid  taking omeprazole  ? If inc dose   Past Medical History:  Diagnosis Date   Adjustment disorder with anxiety    B12 deficiency due to diet    is a vegetarian   Chest pain, unspecified    Chronic anticoagulation    Fatty liver    Per patient   GERD (gastroesophageal reflux disease)    Headache(784.0)    migraines and head pain   Hx of varicella    As Child   Iron deficiency anemia, unspecified    Otalgia, unspecified    Other and unspecified hyperlipidemia    Palpitations    Personal history of venous thrombosis and embolism    Recurrent UTI    Rheumatoid arthritis(714.0)    deveshewar   Unspecified vitamin D deficiency    UTI (lower urinary tract infection) 11/28/2011   citrobacter       Family History  Problem Relation Age of Onset   Parkinsonism Mother    Dementia Father    Stroke Father    Hyperlipidemia Father    Diabetes Father    Lymphoma Other    Colon cancer Neg Hx    Colon polyps Neg Hx    Esophageal cancer Neg Hx    Stomach cancer Neg Hx    Rectal cancer Neg Hx     Social History   Socioeconomic History   Marital  status: Married    Spouse name: Not on file   Number of children: 1   Years of education: Not on file   Highest education level: Bachelor's degree (e.g., BA, AB, BS)  Occupational History   Occupation: home maker  Tobacco Use   Smoking status: Never   Smokeless tobacco: Never  Vaping Use   Vaping Use: Never used  Substance and Sexual Activity   Alcohol use: No   Drug use: Never   Sexual activity: Not on file  Other Topics Concern   Not on file  Social History Narrative   Regular Exercise-no   20 yrs in [place]   Son at [facility] [employer]   From [place].   Married HH  of 3    G1P1            Social Determinants of Health   Financial Resource Strain: Low Risk    Difficulty of Paying Living Expenses: Not hard at all  Food Insecurity: No Food Insecurity   Worried About Charity fundraiser in the Last Year: Never true   Ran Out of Food in the Last Year: Never true  Transportation Needs: No Transportation Needs   Lack of Transportation (Medical): No   Lack of Transportation (Non-Medical): No  Physical Activity: Insufficiently Active   Days of Exercise per Week: 2 days   Minutes of Exercise per Session: 50 min  Stress: Stress Concern Present   Feeling of Stress : To some extent  Social Connections: Unknown   Frequency of Communication with Friends and Family: More than three times a week   Frequency of Social Gatherings with Friends and Family: Patient refused   Attends Religious Services: Patient refused   Marine scientist or Organizations: No   Attends Music therapist: Not on file   Marital Status: Married    Outpatient Medications Prior to Visit  Medication Sig Dispense Refill   alendronate (FOSAMAX) 70 MG tablet TAKE 1 TABLET(70 MG) BY MOUTH 1 TIME A WEEK WITH A FULL GLASS OF WATER AND ON AN EMPTY STOMACH 12 tablet 0   ALPRAZolam (XANAX) 0.25 MG tablet Take 1 tablet (0.25 mg total) by mouth 2 (two) times daily as needed for anxiety. 20 tablet 0   Calcium  Carb-Cholecalciferol 600-800 MG-UNIT TABS Take by mouth.     Cholecalciferol (VITAMIN D3) 2000 UNITS TABS Take 1 tablet by mouth daily.     Cranberry 1000 MG CAPS Take by mouth.     cycloSPORINE (RESTASIS) 0.05 % ophthalmic emulsion Place 1 drop into both eyes daily.     Eflornithine HCl 13.9 % cream Apply topically daily.      folic acid (FOLVITE) 1 MG tablet TAKE 2 TABLETS(2 MG) BY MOUTH DAILY 180 tablet 3   hydroxychloroquine (PLAQUENIL) 200 MG tablet Take 1 tablet 200 mg BID Monday-Friday 120 tablet 0   methotrexate (RHEUMATREX) 2.5 MG tablet Take 8 tablets (20 mg total) by mouth once a week. Caution:Chemotherapy. Protect from light. 96 tablet 0   mineral/vitamin supplement (MULTIGEN) 70 MG TABS tablet Take 1 tablet (70 mg total) by mouth daily. 90 tablet 1   omeprazole (PRILOSEC) 20 MG capsule Take 1 capsule (20 mg total) by mouth as needed. 30 capsule 3   SUMAtriptan (IMITREX) 50 MG tablet TAKE ONE TABLET BY MOUTH AS NEEDED. MAY REPEAT IN 2 TO 4 HOURS IF NEEDED. 9 tablet 1   warfarin (COUMADIN) 2.5 MG tablet TAKE 2 TABLETS BY MOUTH DAILY, EXCEPT TAKE 1 TABLET ON MONDAYS AND THURSDAYS OR AS DIRECTED BY ANTICOAGULATION CLINIC 180 tablet 1   No facility-administered medications prior to visit.     EXAM:  BP 114/70 (BP Location: Right Arm, Patient Position: Sitting, Cuff Size: Normal)    Pulse 64    Temp 97.9 F (36.6 C) (Oral)    Ht 5' 3.5" (1.613 m)    Wt 161 lb 6.4 oz (73.2 kg)    SpO2 98%    BMI 28.14 kg/m   Body mass index is 28.14 kg/m.  GENERAL: vitals reviewed and listed above, alert, oriented, appears well hydrated and in no acute distress HEENT: atraumatic, conjunctiva  clear, no obvious abnormalities on inspection of external nose and ears OP  LUNGS: clear to auscultation bilaterally, no wheezes, rales or rhonchi, good air movement CV: HRRR, no clubbing cyanosis or  peripheral edema nl cap refill  Abdomen:  Sof,t normal bowel sounds without hepatosplenomegaly, no guarding  rebound or masses no CVA  tenderness  MS: moves all extremities without noticeable focal  abnormality PSYCH: pleasant and cooperative, no obvious depression or anxiety Lab Results  Component Value Date   WBC 8.5 06/19/2021   HGB 14.4 06/19/2021   HCT 44.1 06/19/2021   PLT 278 06/19/2021   GLUCOSE 85 06/19/2021   CHOL 192 06/19/2021   TRIG 144 06/19/2021   HDL 67 06/19/2021   LDLDIRECT 97.3 05/25/2013   LDLCALC 101 (H) 06/19/2021   ALT 10 06/19/2021   AST 16 06/19/2021   NA 139 06/19/2021   K 4.2 06/19/2021   CL 104 06/19/2021   CREATININE 0.78 06/19/2021   BUN 8 06/19/2021   CO2 27 06/19/2021   TSH 3.21 09/13/2019   INR 1.4 (A) 07/10/2021   HGBA1C 6.0 07/20/2018   BP Readings from Last 3 Encounters:  07/16/21 114/70  06/19/21 97/66  04/02/21 104/74  Urinalysis    Component Value Date/Time   COLORURINE YELLOW 07/16/2021 0941   APPEARANCEUR Sl Cloudy (A) 07/16/2021 0941   LABSPEC <=1.005 (A) 07/16/2021 0941   PHURINE 6.0 07/16/2021 0941   GLUCOSEU NEGATIVE 07/16/2021 0941   HGBUR TRACE-INTACT (A) 07/16/2021 0941   HGBUR trace-lysed 04/09/2010 0929   BILIRUBINUR negative 07/16/2021 [place] 07/16/2021 0941   KETONESUR NEGATIVE 07/16/2021 0941   PROTEINUR Negative 07/16/2021 0943   UROBILINOGEN 0.2 07/16/2021 0943   UROBILINOGEN 0.2 07/16/2021 0941   NITRITE negative 07/16/2021 0943   NITRITE NEGATIVE 07/16/2021 0941   LEUKOCYTESUR Moderate (2+) (A) 07/16/2021 0943   LEUKOCYTESUR MODERATE (A) 07/16/2021 0941      ASSESSMENT AND PLAN:  Discussed the following assessment and plan:  Lower urinary tract symptoms (LUTS) - minimal   but concern could be event uti  using coc oild as very dry . hasnt  begun top estr yet  History of recurrent UTIs  High risk medication use Agree proceed with colon   can add antibiotic if cx pos and sx  Would avoid  daily  antibiotic and use topical estrogen  first line.  Ask for non red or purple  as per patient  but she can ask Gi if pills a problem as opposed to   fluids  -Patient advised to return or notify health care team  if  new concerns arise.  Patient Instructions  Good to see  you  today .   Checking culture   Lungs are clear .  Get the colonoscopy over with. .   Should be able to do the colon on antibiotic    but ask the GI  team.    Standley Brooking. Kainan Patty M.D.

## 2021-07-16 NOTE — Patient Instructions (Signed)
Good to see  you  today .   Checking culture   Lungs are clear .  Get the colonoscopy over with. .   Should be able to do the colon on antibiotic    but ask the GI  team.

## 2021-07-17 ENCOUNTER — Encounter: Payer: Self-pay | Admitting: Internal Medicine

## 2021-07-17 ENCOUNTER — Telehealth: Payer: Self-pay | Admitting: Internal Medicine

## 2021-07-17 LAB — URINE CULTURE
MICRO NUMBER:: 13067525
SPECIMEN QUALITY:: ADEQUATE

## 2021-07-17 NOTE — Telephone Encounter (Signed)
Yes ok  whatever works best  for patient

## 2021-07-17 NOTE — Telephone Encounter (Signed)
[name] from [facility] call and stated she want know if they can switch Cephalexin from a cap to a tab . ?

## 2021-07-17 NOTE — Telephone Encounter (Signed)
Patient called to follow up on medication authorization. I let patient know that the messages had been sent back. Please see patients previous mychart message ? ? ?Please advise  ?

## 2021-07-17 NOTE — Telephone Encounter (Signed)
So no reason to delay the colonoscopy.  I am okay with changing the Keflex to a white pill if that is preferred.  Please call pharmacy and okay this ?If you are feeling uncomfortable just go ahead and start the antibiotic and okay to stop hold the day of the colonoscopy.

## 2021-07-17 NOTE — Telephone Encounter (Signed)
I spoke with the pharmacist at [facility] and she has been given the ok to change from capsules to tabs.  ?

## 2021-07-18 ENCOUNTER — Other Ambulatory Visit: Payer: Self-pay | Admitting: Rheumatology

## 2021-07-18 NOTE — Progress Notes (Signed)
Good news  no significant bacterial growth . So no need to take the antibiotic at this time .  The wbc  in urine could be from external irritation .   Proceed with the colonoscopy   and let us know how doing  next week . I still advice trying the topical estrogen  therapy  should be safe.Marland KitchenMarland KitchenMarland Kitchen

## 2021-07-18 NOTE — Telephone Encounter (Signed)
Next Visit: 09/26/2021 ? ?Last Visit: 06/19/2021 ? ?Labs: 06/19/2021 CMP WNL.  RBC count is borderline elevated-5.26.  rest of CBC WNL. ? ?Eye exam: 08/08/2020 WNL   ? ?Current Dose per office note 06/19/2021: Plaquenil 200 mg by mouth daily ? ?[id number] arthritis of multiple sites with negative rheumatoid factor  ? ?Last Fill: 04/26/2021 ? ?Okay to refill Plaquenil?  ?

## 2021-07-19 ENCOUNTER — Encounter: Payer: Self-pay | Admitting: Internal Medicine

## 2021-07-19 ENCOUNTER — Other Ambulatory Visit: Payer: Self-pay

## 2021-07-19 ENCOUNTER — Ambulatory Visit (AMBULATORY_SURGERY_CENTER): Payer: BC Managed Care – PPO | Admitting: Internal Medicine

## 2021-07-19 ENCOUNTER — Telehealth: Payer: Self-pay | Admitting: Internal Medicine

## 2021-07-19 VITALS — BP 114/60 | HR 53 | Temp 97.2°F | Resp 11 | Ht 63.5 in | Wt 161.0 lb

## 2021-07-19 DIAGNOSIS — D125 Benign neoplasm of sigmoid colon: Secondary | ICD-10-CM

## 2021-07-19 DIAGNOSIS — Z1211 Encounter for screening for malignant neoplasm of colon: Secondary | ICD-10-CM

## 2021-07-19 MED ORDER — SODIUM CHLORIDE 0.9 % IV SOLN: 500.0000 mL | Freq: Once | INTRAVENOUS | Status: DC

## 2021-07-19 NOTE — Progress Notes (Signed)
Called to room to assist during endoscopic procedure.  Patient ID and intended procedure confirmed with present staff. Received instructions for my participation in the procedure from the performing physician.  

## 2021-07-19 NOTE — Patient Instructions (Signed)
Handout given for polyps. ? ?RESUME COUMADIN TONIGHT, Friday 07/19/21 AT PREVIOUS DOSE. ? ?YOU HAD AN ENDOSCOPIC PROCEDURE TODAY AT [facility]:   Refer to the procedure report that was given to you for any specific questions about what was found during the examination.  If the procedure report does not answer your questions, please call your gastroenterologist to clarify.  If you requested that your care partner not be given the details of your procedure findings, then the procedure report has been included in a sealed envelope for you to review at your convenience later. ? ?YOU SHOULD EXPECT: Some feelings of bloating in the abdomen. Passage of more gas than usual.  Walking can help get rid of the air that was put into your GI tract during the procedure and reduce the bloating. If you had a lower endoscopy (such as a colonoscopy or flexible sigmoidoscopy) you may notice spotting of blood in your stool or on the toilet paper. If you underwent a bowel prep for your procedure, you may not have a normal bowel movement for a few days. ? ?Please Note:  You might notice some irritation and congestion in your nose or some drainage.  This is from the oxygen used during your procedure.  There is no need for concern and it should clear up in a day or so. ? ?SYMPTOMS TO REPORT IMMEDIATELY: ? ?Following lower endoscopy (colonoscopy): ? Excessive amounts of blood in the stool ? Significant tenderness or worsening of abdominal pains ? Swelling of the abdomen that is new, acute ? Fever of 100?F or higher ? ?For urgent or emergent issues, a gastroenterologist can be reached at any hour by calling [phone]. ?Do not use MyChart messaging for urgent concerns.  ? ? ?DIET:  We do recommend a small meal at first, but then you may proceed to your regular diet.  Drink plenty of fluids but you should avoid alcoholic beverages for 24 hours. ? ?ACTIVITY:  You should plan to take it easy for the rest of today and you  should NOT DRIVE or use heavy machinery until tomorrow (because of the sedation medicines used during the test).   ? ?FOLLOW UP: ?Our staff will call the number listed on your records 48-72 hours following your procedure to check on you and address any questions or concerns that you may have regarding the information given to you following your procedure. If we do not reach you, we will leave a message.  We will attempt to reach you two times.  During this call, we will ask if you have developed any symptoms of COVID 19. If you develop any symptoms (ie: fever, flu-like symptoms, shortness of breath, cough etc.) before then, please call [phone].  If you test positive for Covid 19 in the 2 weeks post procedure, please call and report this information to [place].   ? ?If any biopsies were taken you will be contacted by phone or by letter within the next 1-3 weeks.  Please call us at [phone] if you have not heard about the biopsies in 3 weeks.  ? ? ?SIGNATURES/CONFIDENTIALITY: ?You and/or your care partner have signed paperwork which will be entered into your electronic medical record.  These signatures attest to the fact that that the information above on your After Visit Summary has been reviewed and is understood.  Full responsibility of the confidentiality of this discharge information lies with you and/or your care-partner.  ?

## 2021-07-19 NOTE — Telephone Encounter (Signed)
Pt wanted to know if she needed to drink the second half of her prep because she "felt that she was cleaned out enough" from the first part of the prep. Advised pt that she does need to drink the second half of her prep this morning. Pt also wanted to know if she could start her prep earlier since she has to commute. Advised pt that she could start her prep 1hr earlier if that would work better for her. Pt verbalzied understanding and had no further concerns at the end of the call.  ?

## 2021-07-19 NOTE — Progress Notes (Signed)
ASSESSMENT AND PLAN   ?  ?# 61 yo female overdue for 10 year screening colonoscopy. She has postponed colonoscopy for the last few years. Very occasional, scant rectal bleeding if constipated. Otherwise, no GI complaints. Hgb normal.  ?-- Patient will be scheduled for colonoscopy. The risks and benefits of colonoscopy with possible polypectomy / biopsies were discussed and the patient agrees to proceed.  ?  ?# Hypercoagulable state / history of bilateral PE. On chronic warfarin.  ?--Hold Coumadinx for 5 days before procedure - will instruct when and how to resume after procedure. Patient understands that there is a low but real risk of cardiovascular event such as heart attack, stroke, or embolism /  thrombosis, or ischemia while off Coumadin. The patient consents to proceed. Will communicate by phone or EMR with patient's prescribing provider to confirm that holding Coumadin is reasonable in this case.  ?  ?# Rheumatoid Arthritis, on plaquenil and methotrexate. Sometimes requires steroids.  ?  ?# Multiple drug allergies. IV dye allergy.  ?  ?HISTORY OF PRESENT ILLNESS   ?  ?  ?Chief Complaint : discuss colonoscopy  ?  ?[name] is a 60 y.o. female from [place] with a past medical history significant for GERD, rheumatoid arthritis, hypercoagulable disorder evaluated at [facility] in 2015, history of bilateral PE on chronic Coumadin. See PMH below for any additional history.  ?  ?Patient is known to Dr. Henrene Pastor. She was seen in 2018 at which time she and Dr. Henrene Pastor had a detailed discussion of colon cancer screening options. She wanted to think about her options and get back to [place].  She returned in Sept 2021 for evaluation of abdominal pain. It was noted that she never did have any form of colon cancer screening done.  She still was not ready to proceed , at that time due to the COVID 19 pandemic.   Patient comes in today ready to proceed with a colonoscopy.  She has been vaccinated against COVID.  She is comfortable  holding warfarin for the procedure.  Patient says that she is at low risk for PE. She has no GI complaints. She rarely has very scant rectal bleeding if constipated. For the most part her bowel movements are normal.  ?  ?  ?Data Reviewed: ?July 2022 ?CMP normal ?Hemoglobin 14.5, MCV 84, platelets 267 ?  ?    ?Past Medical History:  ?Diagnosis Date  ? Adjustment disorder with anxiety    ? B12 deficiency due to diet    ?  is a vegetarian  ? Chronic anticoagulation    ? Fatty liver    ?  Per patient  ? Headache(784.0)    ?  migraines and head pain  ? Hx of varicella    ?  As Child  ? Iron deficiency anemia, unspecified    ? Otalgia, unspecified    ? Other and unspecified hyperlipidemia    ? Palpitations    ? Personal history of venous thrombosis and embolism    ? Recurrent UTI    ? Rheumatoid arthritis(714.0)    ?  deveshewar  ? Unspecified vitamin D deficiency    ? UTI (lower urinary tract infection) 11/28/2011  ?  citrobacter     ?  ?  ?  ?     ?Past Surgical History:  ?Procedure Laterality Date  ? ORIF ULNAR FRACTURE      ? TYMPANOSTOMY TUBE PLACEMENT   2009  ?  Right  ?  ?     ?  Family History  ?Problem Relation Age of Onset  ? Dementia Father    ? Stroke Father    ? Hyperlipidemia Father    ? Diabetes Father    ? Parkinsonism Mother    ? Lymphoma Other    ? Colon cancer Neg Hx    ?  ?Social History  ?  ?    ?Tobacco Use  ? Smoking status: Never  ? Smokeless tobacco: Never  ?Vaping Use  ? Vaping Use: Never used  ?Substance Use Topics  ? Alcohol use: No  ? Drug use: Never  ?  ?      ?Current Outpatient Medications  ?Medication Sig Dispense Refill  ? alendronate (FOSAMAX) 70 MG tablet TAKE 1 TABLET(70 MG) BY MOUTH 1 TIME A WEEK WITH A FULL GLASS OF WATER AND ON AN EMPTY STOMACH 4 tablet 2  ? Calcium Carb-Cholecalciferol 600-800 MG-UNIT TABS Take by mouth.      ? Cholecalciferol (VITAMIN D3) 2000 UNITS TABS Take 1 tablet by mouth daily.      ? cycloSPORINE (RESTASIS) 0.05 % ophthalmic emulsion Place 1 drop into both  eyes daily.      ? Eflornithine HCl 13.9 % cream Apply topically daily.       ? folic acid (FOLVITE) 1 MG tablet TAKE 2 TABLETS(2 MG) BY MOUTH DAILY 180 tablet 3  ? hydroxychloroquine (PLAQUENIL) 200 MG tablet Take 1 tablet 200 mg BID Monday-Friday 120 tablet 0  ? methotrexate (RHEUMATREX) 2.5 MG tablet Take 8 tablets (20 mg total) by mouth once a week. Caution:Chemotherapy. Protect from light. 96 tablet 0  ? mineral/vitamin supplement (MULTIGEN) 70 MG TABS tablet TAKE 1 TABLET BY MOUTH DAILY 90 tablet 3  ? omeprazole (PRILOSEC) 20 MG capsule Take 1 capsule (20 mg total) by mouth as needed. 30 capsule 3  ? SUMAtriptan (IMITREX) 50 MG tablet TAKE ONE TABLET BY MOUTH AS NEEDED. MAY REPEAT IN 2 TO 4 HOURS IF NEEDED. 9 tablet 1  ? warfarin (COUMADIN) 2.5 MG tablet TAKE 2 TABLETS BY MOUTH DAILY, EXCEPT ON MONDAY AND THURSDAY TAKE 1 TABLET. OR AS DIRECTED BY ANTICOAGULATION CLINIC (Patient taking differently: TAKE 2 TABLETS BY MOUTH DAILY, EXCEPT ON SUNDAY AND THURSDAY TAKE 1 TABLET. OR AS DIRECTED BY ANTICOAGULATION CLINIC) 180 tablet 1  ?  ?No current facility-administered medications for this visit.  ?  ?     ?Allergies  ?Allergen Reactions  ? Boniva [Ibandronate Sodium]    ?    Heartburn  ? Ivp Dye [Iodinated Diagnostic Agents] Swelling  ?    Mouth swelling.  ? Other Swelling  ?    Mouth swelling. ?Tongue   ? Risedronate Sodium Other (See Comments)  ?    Foot and bone pain and fatigue ?Foot and bone pain and fatigue  ? Risedronate Sodium [Risedronate Sodium] Other (See Comments)  ?    Foot and bone pain and fatigue  ? Macrobid [Nitrofurantoin] Rash  ?  ?  ?  ?Review of Systems: ?No chest pain. No shortness of breath. No urinary symptoms ?  ?PHYSICAL EXAM :   ?  ?   ?Wt Readings from Last 3 Encounters:  ?02/19/21 162 lb (73.5 kg)  ?12/12/20 156 lb 3.2 oz (70.9 kg)  ?10/29/20 157 lb 9.6 oz (71.5 kg)  ?  ?  ?BP (!) 102/56   Pulse 64   Ht 5' 3.5" (1.613 m)   Wt 162 lb (73.5 kg)   BMI 28.25 kg/m?  ?Constitutional:   Pleasant female  in no acute distress. Wearing face mask , disposable gloves ?Psychiatric: Normal mood and affect. Behavior is normal. ?EENT: Pupils normal.  Conjunctivae are normal. No scleral icterus. ?Cardiovascular: Normal rate, regular rhythm. No edema ?Pulmonary/chest: Effort normal and breath sounds normal. No wheezing, rales or rhonchi. ?Abdominal: Soft, nondistended, nontender. Bowel sounds active throughout. There are no masses palpable. No hepatomegaly. ?Neurological: Alert and oriented to person place and time. ?  ?Tye Savoy, NP  02/19/2021, 11:42 AM ? ?The patient was seen in the office February 19, 2021 regarding screening colonoscopy.  See that dictation.  She is now for that examination.  No interval changes. ?

## 2021-07-19 NOTE — Progress Notes (Signed)
Sedate, gd SR, tolerated procedure well, VSS, report to RN 

## 2021-07-19 NOTE — Op Note (Signed)
[facility] ?Patient Name: [name] ?Procedure Date: 07/19/2021 3:59 PM ?MRN: [id number] ?Endoscopist: Docia Chuck. Henrene Pastor , MD ?Age: 61 ?Referring MD:  ?Date of Birth: [birth year] ?Gender: Female ?Account #: [id number] ?Procedure:                Colonoscopy with cold snare polypectomy x 1 ?Indications:              Screening for colorectal malignant neoplasm ?Medicines:                Monitored Anesthesia Care ?Procedure:                Pre-Anesthesia Assessment: ?                          - Prior to the procedure, a History and Physical  ?                          was performed, and patient medications and  ?                          allergies were reviewed. [name] tolerance of  ?                          previous anesthesia was also reviewed. The risks  ?                          and benefits of the procedure and the sedation  ?                          options and risks were discussed with the patient.  ?                          All questions were answered, and informed consent  ?                          was obtained. Prior Anticoagulants: The patient has  ?                          taken no previous anticoagulant or antiplatelet  ?                          agents. ASA Grade Assessment: II - A patient with  ?                          mild systemic disease. After reviewing the risks  ?                          and benefits, the patient was deemed in  ?                          satisfactory condition to undergo the procedure. ?                          After obtaining informed consent, the colonoscope  ?  was passed under direct vision. Throughout the  ?                          procedure, the patient's blood pressure, pulse, and  ?                          oxygen saturations were monitored continuously. The  ?                          Leon [id number] was introduced through the  ?                          anus and advanced to the the cecum, identified by  ?                           appendiceal orifice and ileocecal valve. The  ?                          ileocecal valve, appendiceal orifice, and rectum  ?                          were photographed. The quality of the bowel  ?                          preparation was excellent. The colonoscopy was  ?                          performed without difficulty. The patient tolerated  ?                          the procedure well. The bowel preparation used was  ?                          SUPREP via split dose instruction. ?Scope In: 4:23:12 PM ?Scope Out: 4:35:39 PM ?Scope Withdrawal Time: 0 hours 9 minutes 9 seconds  ?Total Procedure Duration: 0 hours 12 minutes 27 seconds  ?Findings:                 A 5 mm polyp was found in the sigmoid colon. The  ?                          polyp was removed with a cold snare. Resection and  ?                          retrieval were complete. ?                          The exam was otherwise without abnormality on  ?                          direct and retroflexion views. ?Complications:            No immediate complications. Estimated blood loss:  ?  None. ?Estimated Blood Loss:     Estimated blood loss: none. ?Impression:               - One 5 mm polyp in the sigmoid colon, removed with  ?                          a cold snare. Resected and retrieved. ?                          - The examination was otherwise normal on direct  ?                          and retroflexion views. ?Recommendation:           - Repeat colonoscopy in 7 years for surveillance. ?                          - Patient has a contact number available for  ?                          emergencies. The signs and symptoms of potential  ?                          delayed complications were discussed with the  ?                          patient. Return to normal activities tomorrow.  ?                          Written discharge instructions were provided to the  ?                          patient. ?                           - Resume previous diet. ?                          - Continue present medications. ?                          - Await pathology results. ?Docia Chuck. Henrene Pastor, MD ?07/19/2021 4:46:53 PM ?This report has been signed electronically. ?

## 2021-07-19 NOTE — Telephone Encounter (Signed)
Inbound call from patient requesting to speak with a nurse please in regards to her prep medication. ?

## 2021-07-23 ENCOUNTER — Telehealth: Payer: Self-pay

## 2021-07-23 NOTE — Telephone Encounter (Signed)
?  Follow up Call- ? ?Call back number 07/19/2021  ?Post procedure Call Back phone  # [phone]  ?Permission to leave phone message Yes  ?Some recent data might be hidden  ?  ? ?Patient questions: ? ?Do you have a fever, pain , or abdominal swelling? No. ?Pain Score  0 * ? ?Have you tolerated food without any problems? Yes.   ? ?Have you been able to return to your normal activities? Yes.   ? ?Do you have any questions about your discharge instructions: ?Diet   No. ?Medications  No. ?Follow up visit  No. ? ?Do you have questions or concerns about your Care? No. ? ?Actions: ?* If pain score is 4 or above: ?No action needed, pain <4. ? ? ?

## 2021-07-24 ENCOUNTER — Encounter: Payer: Self-pay | Admitting: Internal Medicine

## 2021-07-24 ENCOUNTER — Ambulatory Visit (INDEPENDENT_AMBULATORY_CARE_PROVIDER_SITE_OTHER): Payer: BC Managed Care – PPO

## 2021-07-24 DIAGNOSIS — Z7901 Long term (current) use of anticoagulants: Secondary | ICD-10-CM

## 2021-07-24 LAB — POCT INR: INR: 1.9 — AB (ref 2.0–3.0)

## 2021-07-24 NOTE — Progress Notes (Signed)
Continue 5 mg (2 tablets) daily except take 2.5 mg (1 tablet) on Sundays and Thursdays. Recheck in 4 weeks.  ?

## 2021-07-24 NOTE — Patient Instructions (Addendum)
Pre visit review using our clinic review tool, if applicable. No additional management support is needed unless otherwise documented below in the visit note. ? ?Continue 5 mg (2 tablets) daily except take 2.5 mg (1 tablet) on Sundays and Thursdays. Recheck in 4 weeks.  ?

## 2021-07-29 ENCOUNTER — Ambulatory Visit: Payer: BC Managed Care – PPO | Admitting: Psychology

## 2021-08-12 ENCOUNTER — Ambulatory Visit (INDEPENDENT_AMBULATORY_CARE_PROVIDER_SITE_OTHER): Payer: BC Managed Care – PPO | Admitting: Psychology

## 2021-08-12 ENCOUNTER — Other Ambulatory Visit: Payer: Self-pay | Admitting: Physician Assistant

## 2021-08-12 DIAGNOSIS — F4322 Adjustment disorder with anxiety: Secondary | ICD-10-CM

## 2021-08-12 NOTE — Progress Notes (Signed)
? ? ? ? ? ? ? ? ? ? ? ? ? ? ? ? ? ? ? ? ? ? ? ? ? ? ? ? ? ?08/12/2021  ?Treatrment Plan: ? ?Diagnosis ?F41.1 (Generalized anxiety disorder) [n/a]  ?Z62.820 (Parent child relationship problem) [n/a]  ?Symptoms ?Excessive and/or unrealistic worry that is difficult to control occurring more days than not for at least 6 months about a number of events or activities. (Status: maintained) -- No Description Entered  ?Regularly overindulges their child's wishes and demands. (Status: maintained) -- No Description Entered  ?Medication Status ?compliance  ?Safety ?none  ?If Suicidal or Homicidal State Action Taken: unspecified  ?Current Risk: low ?Medications ?unspecified ?Objectives ?Related Problem: Achieve a level of competent, effective parenting. ?Description: Increase the gradual letting go of their adolescent in constructive, affirmative ways. ?Target Date: 2022-01-12 ?Frequency: Daily ?Modality: individual ?Progress: 50% ? ?Related Problem: Achieve a level of competent, effective parenting. ?Description: Identify unresolved childhood issues that affect parenting and work toward their resolution. ?Target Date: 2022-01-12 ?Frequency: Daily ?Modality: individual ?Progress: 30% ? ?Related Problem: Achieve a level of competent, effective parenting. ?Description: Freely express feelings of frustration, helplessness, and inadequacy that each experiences in the parenting role. ?Target Date: 2022-01-12 ?Frequency: Daily ?Modality: individual ?Progress: 50%  ?Related Problem: Resolve the core conflict that is the source of anxiety. ?Description: Learn and implement problem-solving strategies for realistically addressing worries. ?Target Date: 2022-01-12 ?Frequency: Daily ?Modality: individual ?Progress: 60% ? ?Related Problem: Resolve the core conflict that is the source of anxiety. ?Description: Learn and implement calming skills to reduce overall anxiety and manage anxiety symptoms. ?Target Date: 2022-01-12 ?Frequency:  Daily ?Modality: individual ?Progress: 50% ? ?Related Problem: Resolve the core conflict that is the source of anxiety. ?Description: Describe situations, thoughts, feelings, and actions associated with anxieties and worries, their impact on functioning, and attempts to resolve them. ?Target Date: 2022-01-12 ?Frequency: Daily ?Modality: individual ?Progress: 60% ? ?Client Response ?full compliance  ?Service Location ?Location, [address] [place], [place] [address]  ?Service Code ?cpt [id number]  ?Behavioral activation plan  ?Facilitate problem solving  ?Identify automatic thoughts  ?Emotion regulation skills  ?Provide education, information  ?Self care activities  ?Relaxation training  ?Lifestyle change (exercise, nutrition)  ?Self-monitoring  ?Validate/empathize  ?Session notes: ?F 41.1  ?Goals: She has chronic anxieties, mostly related to health concerns and her son's well-being. Would like to develop strategies to manage symptoms of anxiety. Needs to understand enmeshment and learn to define and manage appropriate family boundaries. Target date is 8-23. Patient has realized significant improvement in recognizing and establishing appropriate boundaries. Anxiety about health and family matters persist and she desires to continue treatment in an effort to manage and reduce these feelings. Target date is 8-23.  ?Meds: Xanax (.25 mg)  ?Patient agrees to a video session due to the Coronavirus. She is at home and I am in my home office.  ?Hadyn and [name]: [name] says he got an opportunity to work at [employer] in [place], but he turned it down because of low pay. He got an offer from [employer] in [place] because the pay was higher. He consulted with his mentor about his decision to take the [employer] job. He supported this decision and encouraged [name] to move forward. He looks forward to working here. We talked about making this 11 week experience far better than all of his time at work and school in the past. Will  strive toward balance.        ? ? ?  Marcelina Morel, PhD 3:10-4:00p 50 min ? ? ? ? ? ? ? ? ? ? ? ? ? ? ? ?

## 2021-08-13 NOTE — Telephone Encounter (Signed)
Next Visit: 09/26/2021 ?  ?Last Visit: 06/19/2021 ?  ?Labs: 06/19/2021 CMP WNL.  RBC count is borderline elevated-5.26.  rest of CBC WNL. ? ?Current Dose per office note 06/19/2021:  Methotrexate 8 tablets by mouth every week ? 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  ?Last Fill: 03/04/2021 ? ?Okay to refill MTX?  ?

## 2021-08-20 ENCOUNTER — Other Ambulatory Visit: Payer: Self-pay | Admitting: Obstetrics and Gynecology

## 2021-08-20 DIAGNOSIS — Z1231 Encounter for screening mammogram for malignant neoplasm of breast: Secondary | ICD-10-CM

## 2021-08-21 ENCOUNTER — Ambulatory Visit (INDEPENDENT_AMBULATORY_CARE_PROVIDER_SITE_OTHER): Payer: BC Managed Care – PPO

## 2021-08-21 DIAGNOSIS — Z7901 Long term (current) use of anticoagulants: Secondary | ICD-10-CM | POA: Diagnosis not present

## 2021-08-21 LAB — POCT INR: INR: 1.6 — AB (ref 2.0–3.0)

## 2021-08-21 NOTE — Patient Instructions (Addendum)
Pre visit review using our clinic review tool, if applicable. No additional management support is needed unless otherwise documented below in the visit note. ? ?Continue 5 mg (2 tablets) daily except take 2.5 mg (1 tablet) on Sundays and Thursdays. Recheck in 4 weeks.  ?

## 2021-08-21 NOTE — Progress Notes (Addendum)
Pt reported she was prescribed Vagifem (estradiol topical) some time ago and has not started using it due to fear of blood clots. Her gynecologist and urologist are recommending this for her frequent UTIs. This is a vaginal insert medication with a moderate interaction with warfarin. This medication reduces the effect of warfarin which can increase the risk of blood clots. Discussed the benefits vs risk with pt and she reported she is thinking she may want to start using the medication. Advised if she does start to contact the coumadin clinic so she can be scheduled to check her INR 5-7 days after starting to assure INR is in range or changes to dosing can be made. Pt verbalized understanding and will call if she starts using medication.  ?Continue 5 mg (2 tablets) daily except take 2.5 mg (1 tablet) on Sundays and Thursdays. Recheck in 4 weeks.  ?

## 2021-08-26 ENCOUNTER — Ambulatory Visit (INDEPENDENT_AMBULATORY_CARE_PROVIDER_SITE_OTHER): Payer: BC Managed Care – PPO | Admitting: Psychology

## 2021-08-26 DIAGNOSIS — F4322 Adjustment disorder with anxiety: Secondary | ICD-10-CM

## 2021-08-26 NOTE — Progress Notes (Signed)
? ? ? ? ? ? ? ? ? ? ? ? ? ? ? ? ? ? ? ? ? ? ? ? ? ? ? ? ? ? ? ? ? ? ? ? ? ? ? ? ? ? ? ? ?08/26/2021  ?Treatrment Plan: ? ?Diagnosis ?F41.1 (Generalized anxiety disorder) [n/a]  ?Z62.820 (Parent child relationship problem) [n/a]  ?Symptoms ?Excessive and/or unrealistic worry that is difficult to control occurring more days than not for at least 6 months about a number of events or activities. (Status: maintained) -- No Description Entered  ?Regularly overindulges their child's wishes and demands. (Status: maintained) -- No Description Entered  ?Medication Status ?compliance  ?Safety ?none  ?If Suicidal or Homicidal State Action Taken: unspecified  ?Current Risk: low ?Medications ?unspecified ?Objectives ?Related Problem: Achieve a level of competent, effective parenting. ?Description: Increase the gradual letting go of their adolescent in constructive, affirmative ways. ?Target Date: 2022-01-12 ?Frequency: Daily ?Modality: individual ?Progress: 50% ? ?Related Problem: Achieve a level of competent, effective parenting. ?Description: Identify unresolved childhood issues that affect parenting and work toward their resolution. ?Target Date: 2022-01-12 ?Frequency: Daily ?Modality: individual ?Progress: 30% ? ?Related Problem: Achieve a level of competent, effective parenting. ?Description: Freely express feelings of frustration, helplessness, and inadequacy that each experiences in the parenting role. ?Target Date: 2022-01-12 ?Frequency: Daily ?Modality: individual ?Progress: 50%  ?Related Problem: Resolve the core conflict that is the source of anxiety. ?Description: Learn and implement problem-solving strategies for realistically addressing worries. ?Target Date: 2022-01-12 ?Frequency: Daily ?Modality: individual ?Progress: 60% ? ?Related Problem: Resolve the core conflict that is the source of anxiety. ?Description: Learn and implement calming skills to reduce overall anxiety and manage anxiety symptoms. ?Target Date:  2022-01-12 ?Frequency: Daily ?Modality: individual ?Progress: 50% ? ?Related Problem: Resolve the core conflict that is the source of anxiety. ?Description: Describe situations, thoughts, feelings, and actions associated with anxieties and worries, their impact on functioning, and attempts to resolve them. ?Target Date: 2022-01-12 ?Frequency: Daily ?Modality: individual ?Progress: 60% ? ?Client Response ?full compliance  ?Service Location ?Location, [address] [place], [place] [address]  ?Service Code ?cpt W4176370  ?Behavioral activation plan  ?Facilitate problem solving  ?Identify automatic thoughts  ?Emotion regulation skills  ?Provide education, information  ?Self care activities  ?Relaxation training  ?Lifestyle change (exercise, nutrition)  ?Self-monitoring  ?Validate/empathize  ?Session notes: ?F 41.1  ?Goals: She has chronic anxieties, mostly related to health concerns and her son's well-being. Would like to develop strategies to manage symptoms of anxiety. Needs to understand enmeshment and learn to define and manage appropriate family boundaries. Target date is 8-23. Patient has realized significant improvement in recognizing and establishing appropriate boundaries. Anxiety about health and family matters persist and she desires to continue treatment in an effort to manage and reduce these feelings. Target date is 8-23.  ?Meds: Xanax (.25 mg)  ?Patient agrees to a video session due to the Coronavirus. She is at home and I am in my home office.  ?Erikka and [name]: [name] says his semester is ending in 2 1/2 weeks.He admits that he lives in fear.He admits that his fears keep him socially isolated and alleviates his social anxiety. Also, factoring in his fear of failure and unwillingness to accept anything less than what he considers perfect. He know that he cannot change by the end of the semester. 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 We talked about how  to use this opportunity  to take calculated and acceptable social risks. He states he is willing and will make the effort. Melba agrees and is hopeful he will follow through. She has been working to "stay in her lane", which is always a challenge. Will meet again at end of semester.           ? ? ?Marcelina Morel, PhD 3:10-4:00p 50 min ? ? ? ? ? ? ? ? ? ? ? ? ? ? ? ?

## 2021-09-03 ENCOUNTER — Other Ambulatory Visit: Payer: Self-pay | Admitting: Physician Assistant

## 2021-09-03 NOTE — Telephone Encounter (Signed)
Next Visit: 09/26/2021 ? ?Last Visit: 06/19/2021 ? ?Last Fill: 06/03/2021 ? ?DX: Age related osteoporosis, unspecified pathological fracture presence  ? ?Current Dose per office note 06/19/2021:  alendronate 70 mg p.o. weekly ? ?Labs: 06/19/2021 CMP WNL.  RBC count is borderline elevated-5.26.  rest of CBC WNL.   ? ?Okay to refill Fosamax?  ?

## 2021-09-05 ENCOUNTER — Other Ambulatory Visit: Payer: Self-pay | Admitting: Rheumatology

## 2021-09-05 MED ORDER — FOLIC ACID 1 MG PO TABS: ORAL_TABLET | ORAL | 3 refills | Status: DC

## 2021-09-05 NOTE — Telephone Encounter (Signed)
Patient called the office requesting a refill of Folic Acid '1mg'$  be sent to [facility] on [address]. ?

## 2021-09-05 NOTE — Telephone Encounter (Signed)
Next Visit: 09/26/2021 ?  ?Last Visit: 06/19/2021 ? ?Current Dose per office note 08/22/477: folic acid 2 mg by mouth daily ? ?[id number] arthritis of multiple sites with negative rheumatoid factor  ?  ?Last Fill: 07/09/2020  ? ?Okay to refill Folic Acid?  ?

## 2021-09-09 ENCOUNTER — Ambulatory Visit: Payer: BC Managed Care – PPO | Admitting: Psychology

## 2021-09-13 NOTE — Progress Notes (Signed)
? ?Office Visit Note ? ?Patient: [name]             ?Date of Birth: 1961/01/14           ?MRN: [id number]             ?PCP: Burnis Medin, MD ?Referring: Burnis Medin, MD ?Visit Date: 09/26/2021 ?Occupation: '@GUAROCC'$ @ ? ?Subjective:  ?Hand stiffness ? ?History of Present Illness: Dietra Stokely is a 61 y.o. female left handed female with history of rheumatoid arthritis, sicca symptoms and osteoarthritis.  She states recently she has been having discomfort in her right hand.  She also has intermittent discomfort in her right shoulder.  None of the other joints are painful.  She has not noticed any joint swelling.  She is also noticing intermittent numbness in her right hand.  She states that her sister-in-law is hospitalized due to liver issues.  She feels that she is having intermittent pain in the region of liver. ? ?Activities of Daily Living:  ?Patient reports morning stiffness for several hours.   ?Patient Reports nocturnal pain.  ?Difficulty dressing/grooming: Denies ?Difficulty climbing stairs: Denies ?Difficulty getting out of chair: Denies ?Difficulty using hands for taps, buttons, cutlery, and/or writing: Denies ? ?Review of Systems  ?Constitutional:  Positive for fatigue.  ?HENT:  Positive for mouth dryness. Negative for mouth sores and nose dryness.   ?Eyes:  Positive for dryness. Negative for pain and itching.  ?Respiratory:  Negative for shortness of breath and difficulty breathing.   ?Cardiovascular:  Negative for chest pain and palpitations.  ?Gastrointestinal:  Negative for blood in stool, constipation and diarrhea.  ?Endocrine: Negative for increased urination.  ?Genitourinary:  Negative for difficulty urinating.  ?Musculoskeletal:  Positive for joint pain, joint pain, joint swelling and morning stiffness. Negative for myalgias, muscle tenderness and myalgias.  ?Skin:  Negative for color change, rash and redness.  ?Allergic/Immunologic: Positive for susceptible to infections.   ?Neurological:  Positive for numbness and parasthesias. Negative for dizziness and headaches.  ?Hematological:  Positive for bruising/bleeding tendency.  ?Psychiatric/Behavioral:  Negative for confusion.   ? ?PMFS History:  ?Patient Active Problem List  ? Diagnosis Date Noted  ? 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[place]) 01/06/2018  ? Long term (current) use of anticoagulants 02/11/2017  ? Sicca syndrome ([place]) 12/22/2016  ? ANA positive 12/22/2016  ? Raised intraocular pressure of both eyes 12/22/2016  ? Migraine 03/31/2016  ? Anxiety 03/31/2016  ? High risk medication use 03/31/2016  ? Primary osteoarthritis of both feet 03/31/2016  ? Primary osteoarthritis of both knees 03/31/2016  ? Stiffness of left hand joint 11/27/2014  ? Elevated IOP 06/09/2014  ? Long term current use of systemic steroids 06/09/2014  ? Medically complex patient 06/09/2014  ? Current use of steroid medication 01/09/2014  ? Xerosis of skin 12/07/2013  ? Encounter for therapeutic drug monitoring 06/09/2013  ? B12 deficiency 05/31/2013  ? Iron deficiency 05/31/2013  ? Visit for preventive health examination 05/25/2012  ? Mammogram abnormal 04/12/2012  ? 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 Vaginal atrophy 01/23/2012  ? History of recurrent UTI (urinary tract infection) 01/23/2012  ? Osteoporosis 04/29/2011  ? Nonspecific abnormal results of liver function study 12/08/2010  ? Chronic anticoagulation   ? Anticoagulant long-term use 07/15/2010  ? 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 Chronic cough 03/19/2009  ? CHEST PAIN-UNSPECIFIED 08/26/2008  ? PALPITATIONS, RECURRENT 07/24/2008  ? Vitamin D deficiency 05/30/2008  ? HYPERLIPIDEMIA 05/30/2008  ? IRON DEFICIENCY 05/30/2008  ?  Adjustment disorder with anxiety 05/30/2008  ? Rheumatoid arthritis ([place]) 05/30/2008  ? PULMONARY EMBOLISM, HX OF 05/30/2008  ?  ?Past Medical History:  ?Diagnosis Date  ? Adjustment disorder with anxiety   ? B12 deficiency due to diet   ? is a vegetarian  ? Chest pain,  unspecified   ? Chronic anticoagulation   ? Fatty liver   ? Per patient  ? GERD (gastroesophageal reflux disease)   ? Headache(784.0)   ? migraines and head pain  ? Hx of varicella   ? As Child  ? Iron deficiency anemia, unspecified   ? Otalgia, unspecified   ? Other and unspecified hyperlipidemia   ? Palpitations   ? Personal history of venous thrombosis and embolism   ? Recurrent UTI   ? Rheumatoid arthritis(714.0)   ? deveshewar  ? Unspecified vitamin D deficiency   ? UTI (lower urinary tract infection) 11/28/2011  ? citrobacter     ?  ?Family History  ?Problem Relation Age of Onset  ? Parkinsonism Mother   ? Dementia Father   ? Stroke Father   ? Hyperlipidemia Father   ? Diabetes Father   ? Lymphoma Other   ? Colon cancer Neg Hx   ? Colon polyps Neg Hx   ? Esophageal cancer Neg Hx   ? Stomach cancer Neg Hx   ? Rectal cancer Neg Hx   ? ?Past Surgical History:  ?Procedure Laterality Date  ? ORIF ULNAR FRACTURE    ? TYMPANOSTOMY TUBE PLACEMENT  2009  ? Right  ? ?Social History  ? ?Social History Narrative  ? Regular Exercise-no  ? 20 yrs in [place]  ? Son at [employer]  ? From [place].  ? Married HH  of 3   ? G1P1  ?   ?   ?   ? ?Immunization History  ?Administered Date(s) Administered  ? Influenza,inj,Quad PF,6+ Mos 04/19/2013, 06/27/2015, 03/05/2016, 02/11/2017, 03/15/2018, 02/23/2019, 04/02/2020, 03/13/2021  ? PFIZER(Purple Top)SARS-COV-2 Vaccination 08/06/2019, 08/25/2019, 01/12/2020, 11/10/2020  ? PPD Test 07/21/2016  ? Tdap 05/31/2013  ? Zoster Recombinat (Shingrix) 01/23/2021, 06/11/2021  ?  ? ?Objective: ?Vital Signs: BP 112/71 (BP Location: Right Arm, Patient Position: Sitting, Cuff Size: Normal)   Pulse 60   Ht 5' 3.5" (1.613 m)   Wt 159 lb 9.6 oz (72.4 kg)   BMI 27.83 kg/m?   ? ?Physical Exam ?Vitals and nursing note reviewed.  ?Constitutional:   ?   Appearance: She is well-developed.  ?HENT:  ?   Head: Normocephalic and atraumatic.  ?Eyes:  ?   Conjunctiva/sclera: Conjunctivae normal.  ?Cardiovascular:  ?    Rate and Rhythm: Normal rate and regular rhythm.  ?   Heart sounds: Normal heart sounds.  ?Pulmonary:  ?   Effort: Pulmonary effort is normal.  ?   Breath sounds: Normal breath sounds.  ?Abdominal:  ?   General: Bowel sounds are normal.  ?   Palpations: Abdomen is soft.  ?Musculoskeletal:  ?   Cervical back: Normal range of motion.  ?Lymphadenopathy:  ?   Cervical: No cervical adenopathy.  ?Skin: ?   General: Skin is warm and dry.  ?   Capillary Refill: Capillary refill takes less than 2 seconds.  ?Neurological:  ?   Mental Status: She is alert and oriented to person, place, and time.  ?Psychiatric:     ?   Behavior: Behavior normal.  ?  ? ?Musculoskeletal Exam: C-spine was in good range of motion.  Shoulder joints, elbow joints, wrist  joints with good range of motion.  She had tenderness over the ulnar styloid region.  She had tenderness over right fifth MCP joint.  PIP and DIP thickening was noted.  Hip joints and knee joints with good range of motion.  She had no tenderness over ankles or MTPs. ? ?CDAI Exam: ?CDAI Score: 2.6  ?Patient Global: 3 mm; Provider Global: 3 mm ?Swollen: 0 ; Tender: 2  ?Joint Exam 09/26/2021  ? ?   Right  Left  ?Wrist   Tender     ?MCP 5   Tender     ? ? ? ?Investigation: ?No additional findings. ? ?Imaging: ?No results found. ? ?Recent Labs: ?Lab Results  ?Component Value Date  ? WBC 8.5 06/19/2021  ? HGB 14.4 06/19/2021  ? PLT 278 06/19/2021  ? NA 139 06/19/2021  ? K 4.2 06/19/2021  ? CL 104 06/19/2021  ? CO2 27 06/19/2021  ? GLUCOSE 85 06/19/2021  ? BUN 8 06/19/2021  ? CREATININE 0.78 06/19/2021  ? BILITOT 0.7 06/19/2021  ? ALKPHOS 60 08/29/2020  ? AST 16 06/19/2021  ? ALT 10 06/19/2021  ? PROT 7.2 06/19/2021  ? ALBUMIN 3.7 08/29/2020  ? CALCIUM 9.1 06/19/2021  ? GFRAA 96 07/26/2020  ? QFTBGOLDPLUS NEGATIVE 09/12/2020  ? ? ?Speciality Comments: PLQ Eye Exam: 08/08/2020 [facility] @ [facility] Follow up 6 months ? ?Fosamax 11/21 ? ?Procedures:  ?No procedures performed ?Allergies:  Boniva [ibandronate sodium], Ivp dye [iodinated contrast media], Other, Risedronate sodium, Risedronate sodium [risedronate sodium], and Macrobid [nitrofurantoin]  ? ?Assessment / Plan:     ?Visit Diagnoses: R

## 2021-09-16 ENCOUNTER — Ambulatory Visit (INDEPENDENT_AMBULATORY_CARE_PROVIDER_SITE_OTHER): Payer: BC Managed Care – PPO | Admitting: Psychology

## 2021-09-16 DIAGNOSIS — F4322 Adjustment disorder with anxiety: Secondary | ICD-10-CM | POA: Diagnosis not present

## 2021-09-16 NOTE — Progress Notes (Signed)
? ? ? ? ? ? ? ? ? ? ? ? ? ? ?  09/16/2021  ?Treatrment Plan: ? ?Diagnosis ?F41.1 (Generalized anxiety disorder) [n/a]  ?Z62.820 (Parent child relationship problem) [n/a]  ?Symptoms ?Excessive and/or unrealistic worry that is difficult to control occurring more days than not for at least 6 months about a number of events or activities. (Status: maintained) -- No Description Entered  ?Regularly overindulges their child's wishes and demands. (Status: maintained) -- No Description Entered  ?Medication Status ?compliance  ?Safety ?none  ?If Suicidal or Homicidal State Action Taken: unspecified  ?Current Risk: low ?Medications ?unspecified ?Objectives ?Related Problem: Achieve a level of competent, effective parenting. ?Description: Increase the gradual letting go of their adolescent in constructive, affirmative ways. ?Target Date: 2022-01-12 ?Frequency: Daily ?Modality: individual ?Progress: 50% ? ?Related Problem: Achieve a level of competent, effective parenting. ?Description: Identify unresolved childhood issues that affect parenting and work toward their resolution. ?Target Date: 2022-01-12 ?Frequency: Daily ?Modality: individual ?Progress: 30% ? ?Related Problem: Achieve a level of competent, effective parenting. ?Description: Freely express feelings of frustration, helplessness, and inadequacy that each experiences in the parenting role. ?Target Date: 2022-01-12 ?Frequency: Daily ?Modality: individual ?Progress: 50%  ?Related Problem: Resolve the core conflict that is the source of anxiety. ?Description: Learn and implement problem-solving strategies for realistically addressing worries. ?Target Date: 2022-01-12 ?Frequency: Daily ?Modality: individual ?Progress: 60% ? ?Related Problem: Resolve the core conflict that is the source of anxiety. ?Description: Learn and implement calming skills to reduce overall anxiety and manage anxiety symptoms. ?Target Date: 2022-01-12 ?Frequency: Daily ?Modality: individual ?Progress:  50% ? ?Related Problem: Resolve the core conflict that is the source of anxiety. ?Description: Describe situations, thoughts, feelings, and actions associated with anxieties and worries, their impact on functioning, and attempts to resolve them. ?Target Date: 2022-01-12 ?Frequency: Daily ?Modality: individual ?Progress: 60% ? ?Client Response ?full compliance  ?Service Location ?Location, [address] [place], [place] [address]  ?Service Code ?cpt [id number]  ?Behavioral activation plan  ?Facilitate problem solving  ?Identify automatic thoughts  ?Emotion regulation skills  ?Provide education, information  ?Self care activities  ?Relaxation training  ?Lifestyle change (exercise, nutrition)  ?Self-monitoring  ?Validate/empathize  ?Session notes: ?F 41.1  ?Goals: She has chronic anxieties, mostly related to health concerns and her son's well-being. Would like to develop strategies to manage symptoms of anxiety. Needs to understand enmeshment and learn to define and manage appropriate family boundaries. Target date is 8-23. Patient has realized significant improvement in recognizing and establishing appropriate boundaries. Anxiety about health and family matters persist and she desires to continue treatment in an effort to manage and reduce these feelings. Target date is 8-23.  ?Meds: Xanax (.25 mg)  ?Patient agrees to a video session due to the Coronavirus. She is at home and I am in my home office.  ?Aronda and [name]: [name] has finished the semester and will be starting his internship. He is worried about Covid 19 and more comfortable wearing a mask. His fear is that Covid 19 will last a long time for him and it will be "annoying". We discussed that the mask has been a convenient way to [name] to isolate himself. Glennys is worried that [name] will continue to isolate. He says he will make efforts to not let fear be his guide.    ? ? ?Marcelina Morel, PhD 3:10-4:00p 50  min ? ? ? ? ? ? ? ? ? ? ? ? ? ? ? ?

## 2021-09-18 ENCOUNTER — Ambulatory Visit (INDEPENDENT_AMBULATORY_CARE_PROVIDER_SITE_OTHER): Payer: BC Managed Care – PPO

## 2021-09-18 ENCOUNTER — Telehealth: Payer: Self-pay

## 2021-09-18 DIAGNOSIS — Z7901 Long term (current) use of anticoagulants: Secondary | ICD-10-CM | POA: Diagnosis not present

## 2021-09-18 LAB — POCT INR: INR: 1.7 — AB (ref 2.0–3.0)

## 2021-09-18 NOTE — Telephone Encounter (Signed)
Pt was in today for INR check. And wanted to discuss Vagifem again.  ?Pt reported she was prescribed Vagifem (estradiol topical) some time ago by Urology, but she has not started using it due to fear of blood clots. Pt was told this medication can increase the risk of clots. Her gynecologist and urologist are recommending this for her frequent UTIs.  ?She "thinks" she may want to start this medication, but "I am not in any hurry and still have not decided if I am going to start it."  ? ?She is requesting if she starts the medication that her INR goal be increased to 2.0-2.3 to decrease the chance of blood clots.  ? ?Advised this would have to be discussed with her PCP concerning raising her INR goal. Pt reports she thinks her PCP prefers the Vagifem cream versus what she was prescribed which is a vaginal insert. She is not sure if one is better than another. She has a f/u GYN apt in June and will talk with them about the medication and also talk to Urology again. Her main concern for taking the medication is blood clots and wants to know if PCP would approve of increasing her INR goal to 2.0-2.3 if she does want to start the medication. She again said she was not in any hurry and will probably wait until after she talks to GYN in June before making a decision but would like to know what PCP thinks about the INR range increase.  ?Advised a message would be sent to PCP and this nurse would f/u with her. Pt appreciative and verbalized understanding. ? ?Advised if she does start using the medication to contact the coumadin clinic so she can be scheduled to check her INR 5-7 days after starting to assure INR is in range or changes to dosing can be made if necessary. Pt verbalized understanding and will call if she starts using medication.  ? ? ?

## 2021-09-18 NOTE — Patient Instructions (Addendum)
Pre visit review using our clinic review tool, if applicable. No additional management support is needed unless otherwise documented below in the visit note. ? ?Continue 5 mg (2 tablets) daily except take 2.5 mg (1 tablet) on Sundays and Thursdays. Recheck in 4 weeks.  ?

## 2021-09-18 NOTE — Progress Notes (Signed)
Pt reported she was prescribed Vagifem (estradiol topical) some time ago by Urology, but she has not started using it due to fear of blood clots. Pt was told this medication can increase the risk of clots. Her gynecologist and urologist are recommending this for her frequent UTIs. This is a vaginal insert medication with a moderate interaction with warfarin. This medication reduces the effect of warfarin which can increase the risk of blood clots. Discussed the benefits vs risk with pt and she reported she is thinking she may want to start using the medication. Advised if she does start to contact the coumadin clinic so she can be scheduled to check her INR 5-7 days after starting to assure INR is in range or changes to dosing can be made. Pt verbalized understanding and will call if she starts using medication.  ? ?Continue 5 mg (2 tablets) daily except take 2.5 mg (1 tablet) on Sundays and Thursdays. Recheck in 4 weeks.  ? ?Sent msg to PCP concerning new medication and INR range.  ?

## 2021-09-19 NOTE — Telephone Encounter (Signed)
Sep 19, 2021 ?Panosh, Standley Brooking, MD ?to Me   ?   8:06 AM ?Ok to  increase the inr   to 2-2.3 range  while on vag estrogen although topical estrogen may not be that much of a risk   , increasing the goal should not be much of a risk either.  ?WP  ? ? ?Advised pt of PCP message. Pt said right now she will most likely wait until the beginning of June to talk with her GYN before she starts using it. She will decide after that apt. Advised if any further questions or she decides to use it before her next coumadin clinic apt to contact the coumadin clinic. Pt verbalized understanding and was appreciative.  ?

## 2021-09-26 ENCOUNTER — Ambulatory Visit (INDEPENDENT_AMBULATORY_CARE_PROVIDER_SITE_OTHER): Payer: BC Managed Care – PPO

## 2021-09-26 ENCOUNTER — Encounter: Payer: Self-pay | Admitting: Rheumatology

## 2021-09-26 ENCOUNTER — Ambulatory Visit (INDEPENDENT_AMBULATORY_CARE_PROVIDER_SITE_OTHER): Payer: BC Managed Care – PPO | Admitting: Rheumatology

## 2021-09-26 VITALS — BP 112/71 | HR 60 | Ht 63.5 in | Wt 159.6 lb

## 2021-09-26 DIAGNOSIS — M1711 Unilateral primary osteoarthritis, right knee: Secondary | ICD-10-CM | POA: Diagnosis not present

## 2021-09-26 DIAGNOSIS — M79641 Pain in right hand: Secondary | ICD-10-CM | POA: Diagnosis not present

## 2021-09-26 DIAGNOSIS — M19071 Primary osteoarthritis, right ankle and foot: Secondary | ICD-10-CM

## 2021-09-26 DIAGNOSIS — Z862 Personal history of diseases of the blood and blood-forming organs and certain disorders involving the immune mechanism: Secondary | ICD-10-CM

## 2021-09-26 DIAGNOSIS — M0609 Rheumatoid arthritis without rheumatoid factor, multiple sites: Secondary | ICD-10-CM

## 2021-09-26 DIAGNOSIS — Z8639 Personal history of other endocrine, nutritional and metabolic disease: Secondary | ICD-10-CM

## 2021-09-26 DIAGNOSIS — M170 Bilateral primary osteoarthritis of knee: Secondary | ICD-10-CM | POA: Diagnosis not present

## 2021-09-26 DIAGNOSIS — M1712 Unilateral primary osteoarthritis, left knee: Secondary | ICD-10-CM | POA: Diagnosis not present

## 2021-09-26 DIAGNOSIS — M3500 Sicca syndrome, unspecified: Secondary | ICD-10-CM | POA: Diagnosis not present

## 2021-09-26 DIAGNOSIS — M79671 Pain in right foot: Secondary | ICD-10-CM

## 2021-09-26 DIAGNOSIS — Z8669 Personal history of other diseases of the nervous system and sense organs: Secondary | ICD-10-CM

## 2021-09-26 DIAGNOSIS — Z79899 Other long term (current) drug therapy: Secondary | ICD-10-CM | POA: Diagnosis not present

## 2021-09-26 DIAGNOSIS — H40053 Ocular hypertension, bilateral: Secondary | ICD-10-CM

## 2021-09-26 DIAGNOSIS — M79642 Pain in left hand: Secondary | ICD-10-CM | POA: Diagnosis not present

## 2021-09-26 DIAGNOSIS — K760 Fatty (change of) liver, not elsewhere classified: Secondary | ICD-10-CM

## 2021-09-26 DIAGNOSIS — N390 Urinary tract infection, site not specified: Secondary | ICD-10-CM

## 2021-09-26 DIAGNOSIS — M79672 Pain in left foot: Secondary | ICD-10-CM | POA: Diagnosis not present

## 2021-09-26 DIAGNOSIS — M19072 Primary osteoarthritis, left ankle and foot: Secondary | ICD-10-CM

## 2021-09-26 DIAGNOSIS — M810 Age-related osteoporosis without current pathological fracture: Secondary | ICD-10-CM

## 2021-09-26 DIAGNOSIS — Z86711 Personal history of pulmonary embolism: Secondary | ICD-10-CM

## 2021-09-26 NOTE — Patient Instructions (Addendum)
Standing Labs ?We placed an order today for your standing lab work.  ? ?Please have your standing labs drawn in August and every 3 months ? ?If possible, please have your labs drawn 2 weeks prior to your appointment so that the provider can discuss your results at your appointment. ? ?Please note that you may see your imaging and lab results in [place] before we have reviewed them. ?We may be awaiting multiple results to interpret others before contacting you. ?Please allow our office up to 72 hours to thoroughly review all of the results before contacting the office for clarification of your results. ? ?We have open lab daily: ?Monday through Thursday from 1:30-4:30 PM and Friday from 1:30-4:00 PM ?at the office of Dr. Bo Merino, [facility].   ?Please be advised, all patients with office appointments requiring lab work will take precedent over walk-in lab work.  ?If possible, please come for your lab work on Monday and Friday afternoons, as you may experience shorter wait times. ?The office is located at [address], [place], [place],  [address] ?No appointment is necessary.   ?Labs are drawn by [facility]. Please bring your co-pay at the time of your lab draw.  You may receive a bill from [facility] for your lab work. ? ?Please note if you are on Hydroxychloroquine and and an order has been placed for a Hydroxychloroquine level, you will need to have it drawn 4 hours or more after your last dose. ? ?If you wish to have your labs drawn at another location, please call the office 24 hours in advance to send orders. ? ?If you have any questions regarding directions or hours of operation,  ?please call [phone].   ?As a reminder, please drink plenty of water prior to coming for your lab work. Thanks!  ? ?Vaccines ?You are taking a medication(s) that can suppress your immune system.  The following immunizations are recommended: ?Flu annually ?Covid-19  ?Td/Tdap (tetanus, diphtheria,  pertussis) every 10 years ?Pneumonia (Prevnar 15 then Pneumovax 23 at least 1 year apart.  Alternatively, can take Prevnar 20 without needing additional dose) ?Shingrix: 2 doses from 4 weeks to 6 months apart ? ?Please check with your PCP to make sure you are up to date.  ? ?If you have signs or symptoms of an infection or start antibiotics: ?First, call your PCP for workup of your infection. ?Hold your medication through the infection, until you complete your antibiotics, and until symptoms resolve if you take the following: ?Injectable medication (Actemra, Benlysta, Cimzia, Cosentyx, Enbrel, Humira, Kevzara, Orencia, Remicade, Simponi, [place], [place], [place]) ?Methotrexate ?Leflunomide Jolee Ewing) ?Mycophenolate (Cellcept) ?Roma Kayser, or Rinvoq  ? ? ?Etanercept Injection ?What is this medication? ?ETANERCEPT (et a NER sept) treats autoimmune conditions, such as psoriasis and certain types of arthritis. It works by slowing down an overactive immune system. It belongs to a group of medications called TNF inhibitors. ?This medicine may be used for other purposes; ask your health care provider or pharmacist if you have questions. ?COMMON BRAND NAME(S): Enbrel ?What should I tell my care team before I take this medication? ?They need to know if you have any of these conditions: ?Bleeding disorder ?Cancer ?Diabetes ?Granulomatosis with polyangiitis ?Heart failure ?HIV or AIDS ?Immune system problems ?Infection such as tuberculosis (TB) or other bacterial, fungal or viral infections ?Liver disease ?Nervous system problems such as Guillain-Barre syndrome, multiple sclerosis or seizures ?Recent or upcoming vaccine ?An unusual or allergic reaction to etanercept, other medications, latex,  rubber, food, dyes, or preservatives ?Pregnant or trying to get pregnant ?Breast-feeding ?How should I use this medication? ?The medication is injected under the skin. You will be taught how to prepare and give it. Take it as directed  on the prescription label. Keep taking it unless your care team tells you stop. ?This medication comes with INSTRUCTIONS FOR USE. Ask your pharmacist for directions on how to use this medication. Read the information carefully. Talk to your pharmacist or care team if you have questions. ?If you use a pen, be sure to take off the outer needle cover before using the dose. ?It is important that you put your used needles and syringes in a special sharps container. Do not put them in a trash can. If you do not have a sharps container, call your pharmacist or care team to get one. ?A special MedGuide will be given to you by the pharmacist with each prescription and refill. Be sure to read this information carefully each time. ?Talk to your care team about the use of this medication in children. While it may be prescribed for children as young as 54 years of age for selected conditions, precautions do apply. ?Overdosage: If you think you have taken too much of this medicine contact a poison control center or emergency room at once. ?NOTE: This medicine is only for you. Do not share this medicine with others. ?What if I miss a dose? ?If you miss a dose, take it as soon as you can. If it is almost time for your next dose, take only that dose. Do not take double or extra doses. ?What may interact with this medication? ?Do not take this medication with any of the following: ?Biologic medications such as adalimumab, certolizumab, golimumab, infliximab ?Live vaccines ?Rilonacept ?This medication may also interact with the following: ?Abatacept ?Anakinra ?Biologic medications such as anifrolumab, baricitinib, belimumab, canakinumab, natalizumab, rituximab, sarilumab, tocilizumab, tofacitinib, upadacitinib, vedolizumab ?Cyclophosphamide ?Sulfasalazine ?This list may not describe all possible interactions. Give your health care provider a list of all the medicines, herbs, non-prescription drugs, or dietary supplements you use. Also  tell them if you smoke, drink alcohol, or use illegal drugs. Some items may interact with your medicine. ?What should I watch for while using this medication? ?Visit your care team for regular checks on your progress. Tell your care team if your symptoms do not start to get better or if they get worse. ?This medication may increase your risk of getting an infection. Call your care team for advice if you get a fever, chills, sore throat, or other symptoms of a cold or flu. Do not treat yourself. Try to avoid being around people who are sick. If you have not had the measles or chickenpox vaccines, tell your care team right away if you are around someone with these viruses. ?You will be tested for tuberculosis (TB) before you start this medication. If your care team prescribes any medication for TB, you should start taking the TB medication before starting this medication. Make sure to finish the full course of TB medication. ?Avoid taking medications that contain aspirin, acetaminophen, ibuprofen, naproxen, or ketoprofen unless instructed by your care team. These medications may hide fever. ?Talk to your care team about your risk of cancer. You may be more at risk for certain types of cancer if you take this medication. ?This medication can decrease the response to a vaccine. If you need to get vaccinated, tell your care team if you have received this medication.  Extra booster doses may be needed. Talk to your care team to see if a different vaccination schedule is needed. ?What side effects may I notice from receiving this medication? ?Side effects that you should report to your care team as soon as possible: ?Allergic reactions--skin rash, itching, hives, swelling of the face, lips, tongue, or throat ?Body pain, tingling, or numbness ?Eye pain, change in vision, vision loss ?Heart failure--shortness of breath, swelling of the ankles, feet, or hands, sudden weight gain, unusual weakness or fatigue ?Infection--fever,  chills, cough, sore throat, wounds that don't heal, pain or trouble when passing urine, general feeling of discomfort or being unwell ?Liver injury--right upper belly pain, loss of appetite, nausea, light-colo

## 2021-09-27 ENCOUNTER — Telehealth: Payer: Self-pay | Admitting: *Deleted

## 2021-09-27 LAB — COMPLETE METABOLIC PANEL WITH GFR
AG Ratio: 1.4 (calc) (ref 1.0–2.5)
ALT: 16 U/L (ref 6–29)
AST: 18 U/L (ref 10–35)
Albumin: 4.3 g/dL (ref 3.6–5.1)
Alkaline phosphatase (APISO): 86 U/L (ref 37–153)
BUN: 11 mg/dL (ref 7–25)
CO2: 26 mmol/L (ref 20–32)
Calcium: 9.5 mg/dL (ref 8.6–10.4)
Chloride: 104 mmol/L (ref 98–110)
Creat: 0.82 mg/dL (ref 0.50–1.05)
Globulin: 3 g/dL (calc) (ref 1.9–3.7)
Glucose, Bld: 84 mg/dL (ref 65–99)
Potassium: 4.6 mmol/L (ref 3.5–5.3)
Sodium: 139 mmol/L (ref 135–146)
Total Bilirubin: 0.8 mg/dL (ref 0.2–1.2)
Total Protein: 7.3 g/dL (ref 6.1–8.1)
eGFR: 80 mL/min/{1.73_m2} (ref >=60)

## 2021-09-27 LAB — CBC WITH DIFFERENTIAL/PLATELET
Absolute Monocytes: 764 cells/uL (ref 200–950)
Basophils Absolute: 46 cells/uL (ref 0–200)
Basophils Relative: 0.5 %
Eosinophils Absolute: 110 cells/uL (ref 15–500)
Eosinophils Relative: 1.2 %
HCT: 43.5 % (ref 35.0–45.0)
Hemoglobin: 14.5 g/dL (ref 11.7–15.5)
Lymphs Abs: 3110 cells/uL (ref 850–3900)
MCH: 28 pg (ref 27.0–33.0)
MCHC: 33.3 g/dL (ref 32.0–36.0)
MCV: 84 fL (ref 80.0–100.0)
MPV: 11.3 fL (ref 7.5–12.5)
Monocytes Relative: 8.3 %
Neutro Abs: 5170 cells/uL (ref 1500–7800)
Neutrophils Relative %: 56.2 %
Platelets: 299 10*3/uL (ref 140–400)
RBC: 5.18 10*6/uL — ABNORMAL HIGH (ref 3.80–5.10)
RDW: 13 % (ref 11.0–15.0)
Total Lymphocyte: 33.8 %
WBC: 9.2 10*3/uL (ref 3.8–10.8)

## 2021-09-27 LAB — SEDIMENTATION RATE: Sed Rate: 29 mm/h (ref 0–30)

## 2021-09-27 NOTE — Telephone Encounter (Signed)
Patient advised per Dr. Estanislado Pandy, no changes in the x-rays when compared to the x-rays of 2021. Patient expressed understanding.  ?

## 2021-09-27 NOTE — Progress Notes (Signed)
CBC, CMP and sed rate (which indicates inflammation) are normal.

## 2021-10-01 ENCOUNTER — Ambulatory Visit (INDEPENDENT_AMBULATORY_CARE_PROVIDER_SITE_OTHER): Payer: BC Managed Care – PPO | Admitting: Psychology

## 2021-10-01 ENCOUNTER — Encounter: Payer: Self-pay | Admitting: Rheumatology

## 2021-10-01 DIAGNOSIS — F4322 Adjustment disorder with anxiety: Secondary | ICD-10-CM | POA: Diagnosis not present

## 2021-10-01 NOTE — Progress Notes (Signed)
? ? ? ? ? ? ? ? ? ? ? ? ? ? ? ? ? ? ? ? ? ? ? ? ? ? ? ? ? ?10/01/2021  ?Treatrment Plan: ? ?Diagnosis ?F41.1 (Generalized anxiety disorder) [n/a]  ?Z62.820 (Parent child relationship problem) [n/a]  ?Symptoms ?Excessive and/or unrealistic worry that is difficult to control occurring more days than not for at least 6 months about a number of events or activities. (Status: maintained) -- No Description Entered  ?Regularly overindulges their child's wishes and demands. (Status: maintained) -- No Description Entered  ?Medication Status ?compliance  ?Safety ?none  ?If Suicidal or Homicidal State Action Taken: unspecified  ?Current Risk: low ?Medications ?unspecified ?Objectives ?Related Problem: Achieve a level of competent, effective parenting. ?Description: Increase the gradual letting go of their adolescent in constructive, affirmative ways. ?Target Date: 2022-01-12 ?Frequency: Daily ?Modality: individual ?Progress: 50% ? ?Related Problem: Achieve a level of competent, effective parenting. ?Description: Identify unresolved childhood issues that affect parenting and work toward their resolution. ?Target Date: 2022-01-12 ?Frequency: Daily ?Modality: individual ?Progress: 30% ? ?Related Problem: Achieve a level of competent, effective parenting. ?Description: Freely express feelings of frustration, helplessness, and inadequacy that each experiences in the parenting role. ?Target Date: 2022-01-12 ?Frequency: Daily ?Modality: individual ?Progress: 50%  ?Related Problem: Resolve the core conflict that is the source of anxiety. ?Description: Learn and implement problem-solving strategies for realistically addressing worries. ?Target Date: 2022-01-12 ?Frequency: Daily ?Modality: individual ?Progress: 60% ? ?Related Problem: Resolve the core conflict that is the source of anxiety. ?Description: Learn and implement calming skills to reduce overall anxiety and manage anxiety symptoms. ?Target Date: 2022-01-12 ?Frequency:  Daily ?Modality: individual ?Progress: 50% ? ?Related Problem: Resolve the core conflict that is the source of anxiety. ?Description: Describe situations, thoughts, feelings, and actions associated with anxieties and worries, their impact on functioning, and attempts to resolve them. ?Target Date: 2022-01-12 ?Frequency: Daily ?Modality: individual ?Progress: 60% ? ?Client Response ?full compliance  ?Service Location ?Location, [address] [place], [place] [address]  ?Service Code ?cpt [id number]  ?Behavioral activation plan  ?Facilitate problem solving  ?Identify automatic thoughts  ?Emotion regulation skills  ?Provide education, information  ?Self care activities  ?Relaxation training  ?Lifestyle change (exercise, nutrition)  ?Self-monitoring  ?Validate/empathize  ?Session notes: ?F 41.1  ?Goals: She has chronic anxieties, mostly related to health concerns and her son's well-being. Would like to develop strategies to manage symptoms of anxiety. Needs to understand enmeshment and learn to define and manage appropriate family boundaries. Target date is 8-23. Patient has realized significant improvement in recognizing and establishing appropriate boundaries. Anxiety about health and family matters persist and she desires to continue treatment in an effort to manage and reduce these feelings. Target date is 8-23.  ?Meds: Xanax (.25 mg)  ?Patient agrees to a video session due to the Coronavirus. She is at home and I am in my home office.  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 Will move in to his apartment on Friday. We talked about his disappointment of making a B in a class and it spoils his desire for a perfect record. I shared that this may be the best outcome in that it will hopefully allow him to relax and have a more balanced existence in the next academic year. 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 He agrees and will  make an effort. He wants to determine if he can establish a work life  balance in this job.          ? ? ?Marcelina Morel, PhD 4:10-5:00p 50 min ? ? ? ? ? ? ? ? ? ? ? ? ? ? ? ?

## 2021-10-02 ENCOUNTER — Telehealth: Payer: Self-pay

## 2021-10-02 MED ORDER — PREDNISONE 5 MG PO TABS: ORAL_TABLET | ORAL | 0 refills | Status: DC

## 2021-10-02 NOTE — Telephone Encounter (Signed)
Okay to send a prednisone taper starting at 10 mg and taper by 2.5 mg every 4 days.  If she still continues to have discomfort then she may schedule appointment to discuss treatment options.  Please notify patient that prednisone increases the risk of osteoporosis, diabetes, hypertension and heart disease.

## 2021-10-02 NOTE — Telephone Encounter (Signed)
Pt reports she has been placed on a prednisone taper for 14 days. Inquired if warfarin dosing should be adjusted. Advised the last 3 times she was taking prednisone it did not affect her INR so no changes in dosing will be made. Advised she could have INR checked next week if she is concerned. Pt said she is gong to think about it and if she wants to make apt for next week she will contact clinic on Monday. Advised if any changes to contact clinic. Pt verbalized understanding.  ?

## 2021-10-12 ENCOUNTER — Other Ambulatory Visit: Payer: Self-pay | Admitting: Physician Assistant

## 2021-10-15 ENCOUNTER — Ambulatory Visit: Payer: BC Managed Care – PPO | Admitting: Psychology

## 2021-10-15 NOTE — Telephone Encounter (Signed)
Next Visit: 01/08/2022  Last Visit: 09/26/2021  Labs: 09/26/2021 CBC, CMP and sed rate (which indicates inflammation) are normal.  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

## 2021-10-16 ENCOUNTER — Ambulatory Visit (INDEPENDENT_AMBULATORY_CARE_PROVIDER_SITE_OTHER): Payer: BC Managed Care – PPO

## 2021-10-16 DIAGNOSIS — Z7901 Long term (current) use of anticoagulants: Secondary | ICD-10-CM

## 2021-10-16 LAB — POCT INR: INR: 1.4 — AB (ref 2.0–3.0)

## 2021-10-16 NOTE — Patient Instructions (Addendum)
Pre visit review using our clinic review tool, if applicable. No additional management support is needed unless otherwise documented below in the visit note.  Increase dose today take 7.5 mg (3 tablets) and then continue 5 mg (2 tablets) daily except take 2.5 mg (1 tablet) on Sundays and Thursdays. Recheck in 4 weeks.

## 2021-10-16 NOTE — Progress Notes (Signed)
  Increase dose today take 7.5 mg (3 tablets) and then continue 5 mg (2 tablets) daily except take 2.5 mg (1 tablet) on Sundays and Thursdays. Recheck in 4 weeks.

## 2021-10-17 ENCOUNTER — Ambulatory Visit (INDEPENDENT_AMBULATORY_CARE_PROVIDER_SITE_OTHER): Payer: BC Managed Care – PPO | Admitting: Psychology

## 2021-10-17 DIAGNOSIS — F4322 Adjustment disorder with anxiety: Secondary | ICD-10-CM

## 2021-10-29 ENCOUNTER — Ambulatory Visit: Payer: BC Managed Care – PPO | Admitting: Psychology

## 2021-11-05 ENCOUNTER — Ambulatory Visit
Admission: RE | Admit: 2021-11-05 | Discharge: 2021-11-05 | Disposition: A | Discharge status: Home / Self Care | Payer: BC Managed Care – PPO | Source: Ambulatory Visit | Attending: Obstetrics and Gynecology | Admitting: Obstetrics and Gynecology

## 2021-11-05 DIAGNOSIS — Z1231 Encounter for screening mammogram for malignant neoplasm of breast: Secondary | ICD-10-CM

## 2021-11-12 ENCOUNTER — Ambulatory Visit: Payer: BC Managed Care – PPO | Admitting: Psychology

## 2021-11-13 ENCOUNTER — Ambulatory Visit (INDEPENDENT_AMBULATORY_CARE_PROVIDER_SITE_OTHER): Payer: BC Managed Care – PPO

## 2021-11-13 DIAGNOSIS — Z7952 Long term (current) use of systemic steroids: Secondary | ICD-10-CM | POA: Diagnosis not present

## 2021-11-13 LAB — POCT INR: INR: 1.4 — AB (ref 2.0–3.0)

## 2021-11-13 NOTE — Progress Notes (Signed)
  Increase dose today take 7.5 mg (3 tablets) and then change weekly dose to take  5 mg (2 tablets) daily except take 3.75 mg (1 1/2 tablets) on Sundays and take 1 tablet on Thursdays. Recheck in 3 weeks.

## 2021-11-13 NOTE — Patient Instructions (Addendum)
Pre visit review using our clinic review tool, if applicable. No additional management support is needed unless otherwise documented below in the visit note.   Increase dose today take 7.5 mg (3 tablets) and then change weekly dose to take 5 mg (2 tablets) daily except take 3.75 mg (1 1/2 tablets) on Sundays and take 1 tablet on Thursdays. Recheck in 3 weeks.

## 2021-11-18 ENCOUNTER — Ambulatory Visit: Payer: BC Managed Care – PPO | Admitting: Psychology

## 2021-11-26 ENCOUNTER — Ambulatory Visit: Payer: BC Managed Care – PPO | Admitting: Psychology

## 2021-11-30 ENCOUNTER — Other Ambulatory Visit: Payer: Self-pay | Admitting: Internal Medicine

## 2021-11-30 ENCOUNTER — Other Ambulatory Visit: Payer: Self-pay | Admitting: Physician Assistant

## 2021-12-02 NOTE — Telephone Encounter (Signed)
Next Visit: 01/08/2022   Last Visit: 09/26/2021   Labs: 09/26/2021 CBC, CMP and sed rate (which indicates inflammation) are normal.  Current Dose per office note 09/26/2021: alendronate 70 mg p.o. weekly since 03/2020  Dx: Age related osteoporosis, unspecified pathological fracture presence   Last Fill: 09/03/2021  Okay to refill Fosamax?

## 2021-12-03 ENCOUNTER — Ambulatory Visit: Payer: BC Managed Care – PPO | Admitting: Psychology

## 2021-12-04 ENCOUNTER — Ambulatory Visit: Payer: BC Managed Care – PPO

## 2021-12-04 ENCOUNTER — Ambulatory Visit (INDEPENDENT_AMBULATORY_CARE_PROVIDER_SITE_OTHER): Payer: BC Managed Care – PPO

## 2021-12-04 DIAGNOSIS — Z7901 Long term (current) use of anticoagulants: Secondary | ICD-10-CM | POA: Diagnosis not present

## 2021-12-04 LAB — POCT INR: INR: 1.8 — AB (ref 2.0–3.0)

## 2021-12-04 MED ORDER — WARFARIN SODIUM 2.5 MG PO TABS: ORAL_TABLET | ORAL | 1 refills | Status: DC

## 2021-12-04 NOTE — Progress Notes (Addendum)
Continue  5 mg (2 tablets) daily except take 2.5 mg (1  tablets) on Sundays and take 3.75 mg (1 1/2  tablet) on Thursdays. Recheck in 4 weeks.    Pt requested refill of warfarin. Sent in refill.

## 2021-12-04 NOTE — Patient Instructions (Addendum)
Pre visit review using our clinic review tool, if applicable. No additional management support is needed unless otherwise documented below in the visit note.  Continue  5 mg (2 tablets) daily except take 2.5 mg (1  tablets) on Sundays and take 3.75 mg (1 1/2  tablet) on Thursdays. Recheck in 4 weeks.

## 2021-12-05 ENCOUNTER — Other Ambulatory Visit: Payer: Self-pay | Admitting: Physician Assistant

## 2021-12-05 NOTE — Telephone Encounter (Signed)
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  Okay to refill MTX?

## 2021-12-06 ENCOUNTER — Ambulatory Visit (INDEPENDENT_AMBULATORY_CARE_PROVIDER_SITE_OTHER): Payer: BC Managed Care – PPO | Admitting: Psychology

## 2021-12-06 DIAGNOSIS — F4322 Adjustment disorder with anxiety: Secondary | ICD-10-CM | POA: Diagnosis not present

## 2021-12-06 NOTE — Progress Notes (Signed)
12/06/2021  Treatrment Plan:  Diagnosis F41.1 (Generalized anxiety disorder) [n/a]  Z62.820 (Parent child relationship problem) [n/a]  Symptoms Excessive and/or unrealistic worry that is difficult to control occurring more days than not for at least 6 months about a number of events or activities. (Status: maintained) -- No Description Entered  Regularly overindulges their child's wishes and demands. (Status: maintained) -- No Description Entered  Medication Status compliance  Safety none  If Suicidal or Homicidal State Action Taken: unspecified  Current Risk: low Medications unspecified Objectives Related Problem: Achieve a level of competent, effective parenting. Description: Increase the gradual letting go of their adolescent in constructive, affirmative ways. Target Date: 2022-05-14 Frequency: Daily Modality: individual Progress: 80%  Related Problem: Achieve a level of competent, effective parenting. Description: Identify unresolved childhood issues that affect parenting and work toward their resolution. Target Date: 2022-05-14 Frequency: Daily Modality: individual Progress: 50%  Related Problem: Achieve a level of competent, effective parenting. Description: Freely express feelings of frustration, helplessness, and inadequacy that each experiences in the parenting role. Target Date: 2022-05-14 Frequency: Daily Modality: individual Progress: 80%  Related Problem: Resolve the core conflict that is the source of anxiety. Description: Learn and implement problem-solving strategies for realistically addressing worries. Target Date: 2022-05-14 Frequency: Daily Modality: individual Progress: 70%  Related Problem: Resolve the core conflict that is the source of anxiety. Description: Learn and implement calming skills to reduce overall anxiety and manage  anxiety symptoms. Target Date: 2022-05-14 Frequency: Daily Modality: individual Progress: 70%  Related Problem: Resolve the core conflict that is the source of anxiety. Description: Describe situations, thoughts, feelings, and actions associated with anxieties and worries, their impact on functioning, and attempts to resolve them. Target Date: 2022-05-14 Frequency: Daily Modality: individual Progress: 60%  Client Response full compliance  Service Location Location, [address] [place],  [address]  Service Code cpt [phone]  Behavioral activation plan  Facilitate problem solving  Identify automatic thoughts  Emotion regulation skills  Provide education, information  Self care activities  Relaxation training  Lifestyle change (exercise, nutrition)  Self-monitoring  Validate/empathize  Session notes: F 41.1  Goals: She has chronic anxieties, mostly related to health concerns and her son's well-being. Would like to develop strategies to manage symptoms of anxiety. Needs to understand enmeshment and learn to define and manage appropriate family boundaries. Target date is 12-23. Patient has realized significant improvement in recognizing and establishing appropriate boundaries. Anxiety about health and family matters persist and she desires to continue treatment in an effort to manage and reduce these feelings. Target date is 12-23.  Meds: Xanax (.25 mg)  Patient agrees to a video session due to the Coronavirus. She is at home and I am in my home office.  Ader and [name]: [name] says his job is going well and he has enjoyed the [employer] ge[employer]. He is not especially interested in this particular company. He is frustrated that there are younger people in company with more senior titles, but realizes it makes sense. He says one of the reasons to work at [employer] was to be close to home. He says he has felt "stuck" and "less engaged in life"  during this summer  experience. He does say his anxiety is far less than it was in prior job and being a Ship broker. He felt in a rut and "a little depressed" but as work deadline approaches, he ramps up and feels less depressed as he becomes more workaholic. He has continued to isolate and reject any social activity. Though he is less anxious, he comes across as very depressed. Martena is very worried and concerned about what she is seeing with [name]. He needs to be evaluated for different medication. I am suggesting that he get a Clinical cytogeneticist in [place]. He now feels much like he did his first year in college when he felt isolated and overwhelmed. He admits to being inconsistent with taking his antidepressant. He realizes he needs to be on medicine and agrees to make appointment with psychiatrist. Joslyn is very worried that he will not follow through. Worked with her to "let go" of taking responsibility for [employer]. She needs to let him demonstrate that he can take responsibility for his own health. He has not had any exercise in a very long time, which contributes to his frustration and lethargy. Colisha expresses her disappointment in how the summer has played out. She is upset that he has not done any of the things she was hoping he would do while at internship (driving, socializing, visiting). [name] says that he feels his mother is "devastated" and tries to assure her that he will be okay. He tries to get her to recognize that this is not her issue to deal with. He says he has not been happy at most chapters in his life. I recommended that he seek a therapist in [place] and get psychiatrist asap. He agrees and will follow through. He reassures his mother that he will take care of himself and says that she needs to treat him as a grown competent man. She says she understands, but it continues to be a struggle.                 Marcelina Morel, PhD 8:40-9:30p 50 min

## 2021-12-10 ENCOUNTER — Ambulatory Visit: Payer: BC Managed Care – PPO | Admitting: Psychology

## 2021-12-16 ENCOUNTER — Ambulatory Visit: Payer: BC Managed Care – PPO | Admitting: Psychology

## 2021-12-16 DIAGNOSIS — F4322 Adjustment disorder with anxiety: Secondary | ICD-10-CM

## 2021-12-16 NOTE — Progress Notes (Incomplete)
12/16/2021  Treatrment Plan:  Diagnosis F41.1 (Generalized anxiety disorder) [n/a]  Z62.820 (Parent child relationship problem) [n/a]  Symptoms Excessive and/or unrealistic worry that is difficult to control occurring more days than not for at least 6 months about a number of events or activities. (Status: maintained) -- No Description Entered  Regularly overindulges their child's wishes and demands. (Status: maintained) -- No Description Entered  Medication Status compliance  Safety none  If Suicidal or Homicidal State Action Taken: unspecified  Current Risk: low Medications unspecified Objectives Related Problem: Achieve a level of competent, effective parenting. Description: Increase the gradual letting go of their adolescent in constructive, affirmative ways. Target Date: 2022-05-14 Frequency: Daily Modality: individual Progress: 80%  Related Problem: Achieve a level of competent, effective parenting. Description: Identify unresolved childhood issues that affect parenting and work toward their resolution. Target Date: 2022-05-14 Frequency: Daily Modality: individual Progress: 50%  Related Problem: Achieve a level of competent, effective parenting. Description: Freely express feelings of frustration, helplessness, and inadequacy that each experiences in the parenting role. Target Date: 2022-05-14 Frequency: Daily Modality: individual Progress: 80%  Related Problem: Resolve the core conflict that is the source of anxiety. Description: Learn and implement problem-solving strategies for realistically addressing worries. Target Date: 2022-05-14 Frequency: Daily Modality: individual Progress: 70%  Related Problem: Resolve the core conflict that is the source of anxiety. Description: Learn and implement calming skills to reduce  overall anxiety and manage anxiety symptoms. Target Date: 2022-05-14 Frequency: Daily Modality: individual Progress: 70%  Related Problem: Resolve the core conflict that is the source of anxiety. Description: Describe situations, thoughts, feelings, and actions associated with anxieties and worries, their impact on functioning, and attempts to resolve them. Target Date: 2022-05-14 Frequency: Daily Modality: individual Progress: 60%  Client Response full compliance  Service Location Location, [address] [place], [place] [address]  Service Code cpt [phone]  Behavioral activation plan  Facilitate problem solving  Identify automatic thoughts  Emotion regulation skills  Provide education, information  Self care activities  Relaxation training  Lifestyle change (exercise, nutrition)  Self-monitoring  Validate/empathize  Session notes: F 41.1  Goals: She has chronic anxieties, mostly related to health concerns and her son's well-being. Would like to develop strategies to manage symptoms of anxiety. Needs to understand enmeshment and learn to define and manage appropriate family boundaries. Target date is 12-23. Patient has realized significant improvement in recognizing and establishing appropriate boundaries. Anxiety about health and family matters persist and she desires to continue treatment in an effort to manage and reduce these feelings. Target date is 12-23.  Meds: Xanax (.25 mg)  Patient agrees to a video session due to the Coronavirus. She is at home and I am in my home office.  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 Manager told him he would be recommended for a job. He says he called the psychiatrist office and is waiting to hear back. He says he will reach out to another psychiatrist in [place], which is preferable.  [name] is concerned  that his mom blames herself for his emotional/social struggles. We discussed that they  need to each realize they are responsible for their own behavior.                     [name], [name] 5:15p-6:00p 50 min

## 2021-12-24 ENCOUNTER — Ambulatory Visit (INDEPENDENT_AMBULATORY_CARE_PROVIDER_SITE_OTHER): Payer: BC Managed Care – PPO | Admitting: Psychology

## 2021-12-24 DIAGNOSIS — F4322 Adjustment disorder with anxiety: Secondary | ICD-10-CM

## 2021-12-24 NOTE — Progress Notes (Signed)
12/24/2021  Treatrment Plan:  Diagnosis F41.1 (Generalized anxiety disorder) [n/a]  Z62.820 (Parent child relationship problem) [n/a]  Symptoms Excessive and/or unrealistic worry that is difficult to control occurring more days than not for at least 6 months about a number of events or activities. (Status: maintained) -- No Description Entered  Regularly overindulges their child's wishes and demands. (Status: maintained) -- No Description Entered  Medication Status compliance  Safety none  If Suicidal or Homicidal State Action Taken: unspecified  Current Risk: low Medications unspecified Objectives Related Problem: Achieve a level of competent, effective parenting. Description: Increase the gradual letting go of their adolescent in constructive, affirmative ways. Target Date: 2022-05-14 Frequency: Daily Modality: individual Progress: 80%  Related Problem: Achieve a level of competent, effective parenting. Description: Identify unresolved childhood issues that affect parenting and work toward their resolution. Target Date: 2022-05-14 Frequency: Daily Modality: individual Progress: 50%  Related Problem: Achieve a level of competent, effective parenting. Description: Freely express feelings of frustration, helplessness, and inadequacy that each experiences in the parenting role. Target Date: 2022-05-14 Frequency: Daily Modality: individual Progress: 80%  Related Problem: Resolve the core conflict that is the source of anxiety. Description: Learn and implement problem-solving strategies for realistically addressing worries. Target Date: 2022-05-14 Frequency: Daily Modality: individual Progress: 70%  Related Problem: Resolve the core conflict that is the source of anxiety. Description: Learn and implement calming skills to reduce  overall anxiety and manage anxiety symptoms. Target Date: 2022-05-14 Frequency: Daily Modality: individual Progress: 70%  Related Problem: Resolve the core conflict that is the source of anxiety. Description: Describe situations, thoughts, feelings, and actions associated with anxieties and worries, their impact on functioning, and attempts to resolve them. Target Date: 2022-05-14 Frequency: Daily Modality: individual Progress: 60%  Client Response full compliance  Service Location Location, [address] [place], [place] [address]  Service Code cpt [phone]  Behavioral activation plan  Facilitate problem solving  Identify automatic thoughts  Emotion regulation skills  Provide education, information  Self care activities  Relaxation training  Lifestyle change (exercise, nutrition)  Self-monitoring  Validate/empathize  Session notes: F 41.1  Goals: She has chronic anxieties, mostly related to health concerns and her son's well-being. Would like to develop strategies to manage symptoms of anxiety. Needs to understand enmeshment and learn to define and manage appropriate family boundaries. Target date is 12-23. Patient has realized significant improvement in recognizing and establishing appropriate boundaries. Anxiety about health and family matters persist and she desires to continue treatment in an effort to manage and reduce these feelings. Target date is 12-23.  Meds: Xanax (.25 mg)  Patient agrees to a video session due to the Coronavirus. She is at home and I am in my home office.  Deztinee and [name]: Soroussh is now home for 3 weeks before going back to school. He has a list of psychiatrists in [place] to start calling. He is also seeking a therapist to see at school. He is being offered a job at [employer] and needs to decide how to respond. Banafshef has strong ideas about how  he should proceed. I suggested that he seek a passion rather than going with something that is safe  that would be of little interest to him. Rubie Maid is very distressed that she believes he will not change and is destined to continue with his debilitating anxiety. I reinforced the more optimistic thought that he will be in more intensive therapy and has a better chance of making significant improvements.                    Marcelina Morel, PhD 4:10p-5:00p 50 min

## 2021-12-26 NOTE — Progress Notes (Deleted)
Office Visit Note  Patient: [name]             Date of Birth: 11-17-1960           MRN: [id number]             PCP: Burnis Medin, MD Referring: Burnis Medin, MD Visit Date: 01/08/2022 Occupation: '@GUAROCC'$ @  Subjective:  No chief complaint on file.   History of Present Illness: [name] is a 61 y.o. female ***   Activities of Daily Living:  Patient reports morning stiffness for *** {minute/hour:19697}.   Patient {ACTIONS;DENIES/REPORTS:21021675::"Denies"} nocturnal pain.  Difficulty dressing/grooming: {ACTIONS;DENIES/REPORTS:21021675::"Denies"} Difficulty climbing stairs: {ACTIONS;DENIES/REPORTS:21021675::"Denies"} Difficulty getting out of chair: {ACTIONS;DENIES/REPORTS:21021675::"Denies"} Difficulty using hands for taps, buttons, cutlery, and/or writing: {ACTIONS;DENIES/REPORTS:21021675::"Denies"}  No Rheumatology ROS completed.   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[place]) 01/06/2018   Long term (current) use of anticoagulants 02/11/2017   Sicca syndrome ([place]) 12/22/2016   ANA positive 12/22/2016   Raised intraocular pressure of both eyes 12/22/2016   Migraine 03/31/2016   Anxiety 03/31/2016   High risk medication use 03/31/2016   Primary osteoarthritis of both feet 03/31/2016   Primary osteoarthritis of both knees 03/31/2016   Stiffness of left hand joint 11/27/2014   Elevated IOP 06/09/2014   Long term current use of systemic steroids 06/09/2014   Medically complex patient 06/09/2014   Current use of steroid medication 01/09/2014   Xerosis of skin 12/07/2013   Encounter for therapeutic drug monitoring 06/09/2013   B12 deficiency 05/31/2013   Iron deficiency 05/31/2013   Visit for preventive health examination 05/25/2012   Mammogram abnormal 04/12/2012   Iliac crest  pain 01/23/2012   Vaginal atrophy 01/23/2012   History of recurrent UTI (urinary tract infection)  01/23/2012   Osteoporosis 04/29/2011   Nonspecific abnormal results of liver function study 12/08/2010   Chronic anticoagulation    Anticoagulant long-term use 07/15/2010   Anticardiolipin antibody positive 07/15/2010   Chronic cough 03/19/2009   CHEST PAIN-UNSPECIFIED 08/26/2008   PALPITATIONS, RECURRENT 07/24/2008   Vitamin D deficiency 05/30/2008   HYPERLIPIDEMIA 05/30/2008   IRON DEFICIENCY 05/30/2008   Adjustment disorder with anxiety 05/30/2008   Rheumatoid arthritis ([place]) 05/30/2008   PULMONARY EMBOLISM, HX OF 05/30/2008    Past Medical History:  Diagnosis Date   Adjustment disorder with anxiety    B12 deficiency due to diet    is a vegetarian   Chest pain, unspecified    Chronic anticoagulation    Fatty liver    Per patient   GERD (gastroesophageal reflux disease)    Headache(784.0)    migraines and head pain   Hx of varicella    As Child   Iron deficiency anemia, unspecified    Otalgia, unspecified    Other and unspecified hyperlipidemia    Palpitations    Personal history of venous thrombosis and embolism    Recurrent UTI    Rheumatoid arthritis(714.0)    deveshewar   Unspecified vitamin D deficiency    UTI (lower urinary tract infection) 11/28/2011   citrobacter       Family History  Problem Relation Age of Onset   Parkinsonism Mother    Dementia Father    Stroke Father    Hyperlipidemia Father    Diabetes Father    Lymphoma Other    Colon cancer Neg Hx    Colon polyps Neg Hx    Esophageal cancer Neg Hx  Stomach cancer Neg Hx    Rectal cancer Neg Hx    Breast cancer Neg Hx    Past Surgical History:  Procedure Laterality Date   ORIF ULNAR FRACTURE     TYMPANOSTOMY TUBE PLACEMENT  2009   Right   Social History   Social History Narrative   Regular Exercise-no   20 yrs in [place]   Son at [facility] [employer] [facility] [employer]   From [place].   Married [place]  of 3    G1P1            Immunization History  Administered Date(s) Administered   Influenza,inj,Quad PF,6+ Mos  04/19/2013, 06/27/2015, 03/05/2016, 02/11/2017, 03/15/2018, 02/23/2019, 04/02/2020, 03/13/2021   PFIZER(Purple Top)SARS-COV-2 Vaccination 08/06/2019, 08/25/2019, 01/12/2020, 11/10/2020   PPD Test 07/21/2016   Tdap 05/31/2013   Zoster Recombinat (Shingrix) 01/23/2021, 06/11/2021     Objective: Vital Signs: There were no vitals taken for this visit.   Physical Exam   Musculoskeletal Exam: ***  CDAI Exam: CDAI Score: -- Patient Global: --; Provider Global: -- Swollen: --; Tender: -- Joint Exam 01/08/2022   No joint exam has been documented for this visit   There is currently no information documented on the homunculus. Go to the Rheumatology activity and complete the homunculus joint exam.  Investigation: No additional findings.  Imaging: No results found.  Recent Labs: Lab Results  Component Value Date   WBC 9.2 09/26/2021   HGB 14.5 09/26/2021   PLT 299 09/26/2021   NA 139 09/26/2021   K 4.6 09/26/2021   CL 104 09/26/2021   CO2 26 09/26/2021   GLUCOSE 84 09/26/2021   BUN 11 09/26/2021   CREATININE 0.82 09/26/2021   BILITOT 0.8 09/26/2021   ALKPHOS 60 08/29/2020   AST 18 09/26/2021   ALT 16 09/26/2021   PROT 7.3 09/26/2021   ALBUMIN 3.7 08/29/2020   CALCIUM 9.5 09/26/2021   GFRAA 96 07/26/2020   QFTBGOLDPLUS NEGATIVE 09/12/2020    Speciality Comments: PLQ Eye Exam: 08/08/2020 WNL @ [facility] Follow up 6 months  Fosamax 11/21  Procedures:  No procedures performed Allergies: Boniva [ibandronate sodium], Ivp dye [iodinated contrast media], Other, Risedronate sodium, Risedronate sodium [risedronate sodium], and Macrobid [nitrofurantoin]   Assessment / Plan:     Visit Diagnoses: No diagnosis found.  Orders: No orders of the defined types were placed in this encounter.  No orders of the defined types were placed in this encounter.   Face-to-face time spent with patient was *** minutes. Greater than 50% of time was spent in counseling and  coordination of care.  Follow-Up Instructions: No follow-ups on file.   Earnestine Mealing, CMA  Note - This record has been created using Editor, commissioning.  Chart creation errors have been sought, but may not always  have been located. Such creation errors do not reflect on  the standard of medical care.

## 2022-01-02 ENCOUNTER — Ambulatory Visit (INDEPENDENT_AMBULATORY_CARE_PROVIDER_SITE_OTHER): Payer: BC Managed Care – PPO | Admitting: Psychology

## 2022-01-02 DIAGNOSIS — F4322 Adjustment disorder with anxiety: Secondary | ICD-10-CM | POA: Diagnosis not present

## 2022-01-02 NOTE — Progress Notes (Signed)
01/02/2022  Treatrment Plan:  Diagnosis F41.1 (Generalized anxiety disorder) [n/a]  Z62.820 (Parent child relationship problem) [n/a]  Symptoms Excessive and/or unrealistic worry that is difficult to control occurring more days than not for at least 6 months about a number of events or activities. (Status: maintained) -- No Description Entered  Regularly overindulges their child's wishes and demands. (Status: maintained) -- No Description Entered  Medication Status compliance  Safety none  If Suicidal or Homicidal State Action Taken: unspecified  Current Risk: low Medications unspecified Objectives Related Problem: Achieve a level of competent, effective parenting. Description: Increase the gradual letting go of their adolescent in constructive, affirmative ways. Target Date: 2022-05-14 Frequency: Daily Modality: individual Progress: 80%  Related Problem: Achieve a level of competent, effective parenting. Description: Identify unresolved childhood issues that affect parenting and work toward their resolution. Target Date: 2022-05-14 Frequency: Daily Modality: individual Progress: 50%  Related Problem: Achieve a level of competent, effective parenting. Description: Freely express feelings of frustration, helplessness, and inadequacy that each experiences in the parenting role. Target Date: 2022-05-14 Frequency: Daily Modality: individual Progress: 80%  Related Problem: Resolve the core conflict that is the source of anxiety. Description: Learn and implement problem-solving strategies for realistically addressing worries. Target Date: 2022-05-14 Frequency: Daily Modality: individual Progress: 70%  Related Problem: Resolve the core conflict that is the source of anxiety. Description: Learn and implement calming skills to reduce overall anxiety and manage anxiety symptoms. Target Date: 2022-05-14 Frequency: Daily Modality:  individual Progress: 70%  Related Problem: Resolve the core conflict that is the source of anxiety. Description: Describe situations, thoughts, feelings, and actions associated with anxieties and worries, their impact on functioning, and attempts to resolve them. Target Date: 2022-05-14 Frequency: Daily Modality: individual Progress: 60%  Client Response full compliance  Service Location Location, [address] [place], [place] [address]  Service Code cpt [phone]  Behavioral activation plan  Facilitate problem solving  Identify automatic thoughts  Emotion regulation skills  Provide education, information  Self care activities  Relaxation training  Lifestyle change (exercise, nutrition)  Self-monitoring  Validate/empathize  Session notes: F 41.1  Goals: She has chronic anxieties, mostly related to health concerns and her son's well-being. Would like to develop strategies to manage symptoms of anxiety. Needs to understand enmeshment and learn to define and manage appropriate family boundaries. Target date is 12-23. Patient has realized significant improvement in recognizing and establishing appropriate boundaries. Anxiety about health and family matters persist and she desires to continue treatment in an effort to manage and reduce these feelings. Target date is 12-23.  Meds: Xanax (.25 mg)  Patient agrees to a video session. She is at home and I am in my home office.  Kamara and [name]: [name] says the offer for a job from [employer] was very low. He made a counter offer and is waiting to hear back. Is also talking about jobs to his former employer.  He talked about his early experiences in life, when he felt he struggled with cognitive processing. Making a timely decision has always been difficult. He always feels he needs a lot of time to make a decision. He wonders if it is slow processing or is it anxiety from little confidence and fears of making a mistake. Trinadee feels that  her son's struggle is based in his multiple fears. This keeps her anxious that he will not be able to change  and that he will be destined to be alone in life. She expresses her lack of confidence in ways that keep her negative cycle active. Will work on her trying to change her cognitive process.                       Marcelina Morel, PhD 1:10p-2:00p 50 min

## 2022-01-06 ENCOUNTER — Ambulatory Visit (INDEPENDENT_AMBULATORY_CARE_PROVIDER_SITE_OTHER): Payer: BC Managed Care – PPO

## 2022-01-06 DIAGNOSIS — Z7901 Long term (current) use of anticoagulants: Secondary | ICD-10-CM | POA: Diagnosis not present

## 2022-01-06 LAB — POCT INR: INR: 1.8 — AB (ref 2.0–3.0)

## 2022-01-06 NOTE — Progress Notes (Signed)
Continue  5 mg (2 tablets) daily except take 2.5 mg (1  tablets) on Sundays and take 3.75 mg (1 1/2  tablet) on Thursdays. Recheck in 4 weeks.

## 2022-01-06 NOTE — Patient Instructions (Addendum)
Pre visit review using our clinic review tool, if applicable. No additional management support is needed unless otherwise documented below in the visit note.  Continue  5 mg (2 tablets) daily except take 2.5 mg (1  tablets) on Sundays and take 3.75 mg (1 1/2  tablet) on Thursdays. Recheck in 4 weeks.

## 2022-01-07 ENCOUNTER — Ambulatory Visit (INDEPENDENT_AMBULATORY_CARE_PROVIDER_SITE_OTHER): Payer: BC Managed Care – PPO | Admitting: Psychology

## 2022-01-07 DIAGNOSIS — F4322 Adjustment disorder with anxiety: Secondary | ICD-10-CM | POA: Diagnosis not present

## 2022-01-07 NOTE — Progress Notes (Signed)
01/07/2022  Treatrment Plan:  Diagnosis F41.1 (Generalized anxiety disorder) [n/a]  Z62.820 (Parent child relationship problem) [n/a]  Symptoms Excessive and/or unrealistic worry that is difficult to control occurring more days than not for at least 6 months about a number of events or activities. (Status: maintained) -- No Description Entered  Regularly overindulges their child's wishes and demands. (Status: maintained) -- No Description Entered  Medication Status compliance  Safety none  If Suicidal or Homicidal State Action Taken: unspecified  Current Risk: low Medications unspecified Objectives Related Problem: Achieve a level of competent, effective parenting. Description: Increase the gradual letting go of their adolescent in constructive, affirmative ways. Target Date: 2022-05-14 Frequency: Daily Modality: individual Progress: 80%  Related Problem: Achieve a level of competent, effective parenting. Description: Identify unresolved childhood issues that affect parenting and work toward their resolution. Target Date: 2022-05-14 Frequency: Daily Modality: individual Progress: 50%  Related Problem: Achieve a level of competent, effective parenting. Description: Freely express feelings of frustration, helplessness, and inadequacy that each experiences in the parenting role. Target Date: 2022-05-14 Frequency: Daily Modality: individual Progress: 80%  Related Problem: Resolve the core conflict that is the source of anxiety. Description: Learn and implement problem-solving strategies for realistically addressing worries. Target Date: 2022-05-14 Frequency: Daily Modality: individual Progress: 70%  Related Problem: Resolve the core conflict that is the source of anxiety. Description: Learn and implement calming skills to reduce overall anxiety and manage anxiety symptoms. Target Date: 2022-05-14 Frequency: Daily Modality:  individual Progress: 70%  Related Problem: Resolve the core conflict that is the source of anxiety. Description: Describe situations, thoughts, feelings, and actions associated with anxieties and worries, their impact on functioning, and attempts to resolve them. Target Date: 2022-05-14 Frequency: Daily Modality: individual Progress: 60%  Client Response full compliance  Service Location Location, [address] [place], [place] [address]  Service Code cpt [phone]  Behavioral activation plan  Facilitate problem solving  Identify automatic thoughts  Emotion regulation skills  Provide education, information  Self care activities  Relaxation training  Lifestyle change (exercise, nutrition)  Self-monitoring  Validate/empathize  Session notes: F 41.1  Goals: She has chronic anxieties, mostly related to health concerns and her son's well-being. Would like to develop strategies to manage symptoms of anxiety. Needs to understand enmeshment and learn to define and manage appropriate family boundaries. Target date is 12-23. Patient has realized significant improvement in recognizing and establishing appropriate boundaries. Anxiety about health and family matters persist and she desires to continue treatment in an effort to manage and reduce these feelings. Target date is 12-23.  Meds: Xanax (.25 mg)  Patient agrees to a video session. She is at home and I am in my home office.  [name] and [name]: They had some "drama" this week regarding the student loans. They both fed off of each other's anxiety. They came to a solution. Sorousch admits that he lashes out at others, particularly his parents, when he is stressed and things are not going the way he wants. We again addressed boundaries. She is worried about his health insurance renewal and is inclined to take over the responsibility for taking care of it. I strongly shared the opinion that she needs to let go and allow Shoroush to manage it  on his own. He agrees and she will work on letting go. He has, as previously discussed, made appointments to see a doctor to get  a referral for a psychiatrist and a therapist in [place].                        Krystal Morel, PhD 4:15p-5:00p 45 min

## 2022-01-08 ENCOUNTER — Ambulatory Visit: Payer: BC Managed Care – PPO | Admitting: Rheumatology

## 2022-01-08 DIAGNOSIS — M0609 Rheumatoid arthritis without rheumatoid factor, multiple sites: Secondary | ICD-10-CM

## 2022-01-08 DIAGNOSIS — M3500 Sicca syndrome, unspecified: Secondary | ICD-10-CM

## 2022-01-08 DIAGNOSIS — Z8669 Personal history of other diseases of the nervous system and sense organs: Secondary | ICD-10-CM

## 2022-01-08 DIAGNOSIS — Z86711 Personal history of pulmonary embolism: Secondary | ICD-10-CM

## 2022-01-08 DIAGNOSIS — H40053 Ocular hypertension, bilateral: Secondary | ICD-10-CM

## 2022-01-08 DIAGNOSIS — N390 Urinary tract infection, site not specified: Secondary | ICD-10-CM

## 2022-01-08 DIAGNOSIS — K760 Fatty (change of) liver, not elsewhere classified: Secondary | ICD-10-CM

## 2022-01-08 DIAGNOSIS — Z862 Personal history of diseases of the blood and blood-forming organs and certain disorders involving the immune mechanism: Secondary | ICD-10-CM

## 2022-01-08 DIAGNOSIS — Z79899 Other long term (current) drug therapy: Secondary | ICD-10-CM

## 2022-01-08 DIAGNOSIS — Z8639 Personal history of other endocrine, nutritional and metabolic disease: Secondary | ICD-10-CM

## 2022-01-08 DIAGNOSIS — M19071 Primary osteoarthritis, right ankle and foot: Secondary | ICD-10-CM

## 2022-01-08 DIAGNOSIS — M170 Bilateral primary osteoarthritis of knee: Secondary | ICD-10-CM

## 2022-01-08 DIAGNOSIS — M810 Age-related osteoporosis without current pathological fracture: Secondary | ICD-10-CM

## 2022-01-13 ENCOUNTER — Other Ambulatory Visit: Payer: Self-pay | Admitting: Physician Assistant

## 2022-01-13 NOTE — Telephone Encounter (Signed)
Next Visit: 01/30/2022  Last Visit: 09/26/2021  Labs: 09/26/2021 CBC, CMP and sed rate (which indicates inflammation) are normal.  Eye exam: 08/08/2020 WNL    Current Dose per office note 09/26/2021: Plaquenil 200 mg by mouth daily  QB:[id number] arthritis of multiple sites with negative rheumatoid factor   Last Fill: 10/15/2021  Left message to advise patient we need her updated PLQ eye exam.   Okay to refill Plaquenil?

## 2022-01-17 NOTE — Progress Notes (Signed)
Office Visit Note  Patient: [name]             Date of Birth: June 03, 1960           MRN: [id number]             PCP: Burnis Medin, MD Referring: Burnis Medin, MD Visit Date: 01/30/2022 Occupation: '@GUAROCC'$ @  Subjective:  Lower back pain  History of Present Illness: [name] is a 61 y.o. female with history of seronegative rheumatoid arthritis.  According the patient about 2 weeks ago she started having lower back pain which gradually got worse.  She did not want to go to the emergency room as she was concerned about COVID-19 virus exposure.  She was seen by Dr.Xu on January 28, 2022.  She had x-rays of her lumbar spine which showed some degenerative changes.  She was offered a prednisone taper which she declined.  He also gave her prescription for Flexeril 5 mg which she has not started yet.  She states the Lidoderm patches were not covered by her insurance.  She continues to have lower back pain.  None of the other joints are painful or swollen.  She states that she had some discomfort in her left heel which comes and goes.  She has been taking methotrexate 8 tablets p.o. weekly and hydroxychloroquine 1 tablet p.o. twice daily Monday to Friday.  Activities of Daily Living:  Patient reports morning stiffness for all day. Patient Reports nocturnal pain.  Difficulty dressing/grooming: Denies Difficulty climbing stairs: Denies Difficulty getting out of chair: Denies Difficulty using hands for taps, buttons, cutlery, and/or writing: Reports  Review of Systems  Constitutional:  Positive for fatigue.  HENT:  Positive for mouth dryness. Negative for mouth sores.   Eyes:  Positive for dryness.  Respiratory:  Negative for shortness of breath.   Cardiovascular:  Positive for palpitations. Negative for chest pain.  Gastrointestinal:  Negative for blood in stool, constipation and diarrhea.  Endocrine: Positive for increased urination.  Genitourinary:  Negative for  involuntary urination.  Musculoskeletal:  Positive for joint pain, joint pain, myalgias, morning stiffness, muscle tenderness and myalgias. Negative for gait problem, joint swelling and muscle weakness.  Skin:  Positive for hair loss. Negative for color change, rash and sensitivity to sunlight.  Allergic/Immunologic: Positive for susceptible to infections.  Neurological:  Negative for dizziness and headaches.  Hematological:  Negative for swollen glands.  Psychiatric/Behavioral:  Positive for sleep disturbance. Negative for depressed mood. The patient is nervous/anxious.     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[place]) 01/06/2018   Long term (current) use of anticoagulants 02/11/2017   Sicca syndrome ([place]) 12/22/2016   ANA positive 12/22/2016   Raised intraocular pressure of both eyes 12/22/2016   Migraine 03/31/2016   Anxiety 03/31/2016   High risk medication use 03/31/2016   Primary osteoarthritis of both feet 03/31/2016   Primary osteoarthritis of both knees 03/31/2016   Stiffness of left hand joint 11/27/2014   Elevated IOP 06/09/2014   Long term current use of systemic steroids 06/09/2014   Medically complex patient 06/09/2014   Current use of steroid medication 01/09/2014   Xerosis of skin 12/07/2013   Encounter for therapeutic drug monitoring 06/09/2013   B12 deficiency 05/31/2013   Iron deficiency 05/31/2013   Visit for preventive health examination 05/25/2012   Mammogram abnormal 04/12/2012   Iliac crest  pain 01/23/2012   Vaginal atrophy  01/23/2012   History of recurrent UTI (urinary tract infection) 01/23/2012   Osteoporosis 04/29/2011   Nonspecific abnormal results of liver function study 12/08/2010   Chronic anticoagulation    Anticoagulant long-term use 07/15/2010   Anticardiolipin antibody positive 07/15/2010   Chronic cough 03/19/2009   CHEST PAIN-UNSPECIFIED 08/26/2008   PALPITATIONS,  RECURRENT 07/24/2008   Vitamin D deficiency 05/30/2008   HYPERLIPIDEMIA 05/30/2008   IRON DEFICIENCY 05/30/2008   Adjustment disorder with anxiety 05/30/2008   Rheumatoid arthritis ([place]) 05/30/2008   PULMONARY EMBOLISM, HX OF 05/30/2008    Past Medical History:  Diagnosis Date   Adjustment disorder with anxiety    B12 deficiency due to diet    is a vegetarian   Chest pain, unspecified    Chronic anticoagulation    Fatty liver    Per patient   GERD (gastroesophageal reflux disease)    Headache(784.0)    migraines and head pain   Hx of varicella    As Child   Iron deficiency anemia, unspecified    Otalgia, unspecified    Other and unspecified hyperlipidemia    Palpitations    Personal history of venous thrombosis and embolism    Recurrent UTI    Rheumatoid arthritis(714.0)    deveshewar   Unspecified vitamin D deficiency    UTI (lower urinary tract infection) 11/28/2011   citrobacter       Family History  Problem Relation Age of Onset   Parkinsonism Mother    Dementia Father    Stroke Father    Hyperlipidemia Father    Diabetes Father    Lymphoma Other    Colon cancer Neg Hx    Colon polyps Neg Hx    Esophageal cancer Neg Hx    Stomach cancer Neg Hx    Rectal cancer Neg Hx    Breast cancer Neg Hx    Past Surgical History:  Procedure Laterality Date   ORIF ULNAR FRACTURE     TYMPANOSTOMY TUBE PLACEMENT  2009   Right   Social History   Social History Narrative   Regular Exercise-no   20 yrs in [place]   Son at [employer] [employer]   From [place].   Married HH  of 3    G1P1            Immunization History  Administered Date(s) Administered   Influenza,inj,Quad PF,6+ Mos 04/19/2013, 06/27/2015, 03/05/2016, 02/11/2017, 03/15/2018, 02/23/2019, 04/02/2020, 03/13/2021   PFIZER(Purple Top)SARS-COV-2 Vaccination 08/06/2019, 08/25/2019, 01/12/2020, 11/10/2020   PPD Test 07/21/2016   Tdap 05/31/2013   Zoster Recombinat (Shingrix) 01/23/2021, 06/11/2021     Objective: Vital  Signs: BP 115/75 (BP Location: Right Arm, Patient Position: Sitting, Cuff Size: Normal)   Pulse (!) 54   Ht 5' 3.5" (1.613 m)   Wt 159 lb 12.8 oz (72.5 kg)   BMI 27.86 kg/m    Physical Exam Vitals and nursing note reviewed.  Constitutional:      Appearance: She is well-developed.  HENT:     Head: Normocephalic and atraumatic.  Eyes:     Conjunctiva/sclera: Conjunctivae normal.  Cardiovascular:     Rate and Rhythm: Normal rate and regular rhythm.     Heart sounds: Normal heart sounds.  Pulmonary:     Effort: Pulmonary effort is normal.     Breath sounds: Normal breath sounds.  Abdominal:     General: Bowel sounds are normal.     Palpations: Abdomen is soft.  Musculoskeletal:     Cervical back: Normal range of motion.  Lymphadenopathy:     Cervical: No cervical adenopathy.  Skin:    General: Skin is warm and dry.     Capillary Refill: Capillary refill takes less than 2 seconds.  Neurological:     Mental Status: She is alert and oriented to person, place, and time.  Psychiatric:        Behavior: Behavior normal.      Musculoskeletal Exam: C-spine was in good range of motion.  She had painful range of motion of her lumbar spine with some tenderness over the lower lumbar region.  Shoulder joints, elbow joints, wrist joints, MCPs PIPs and DIPs with good range of motion with no synovitis.  Hip joints, knee joints, ankles, MTPs and PIPs been good range of motion with no synovitis.  She has had tenderness over the lateral aspect of her left calcaneum.  Notes neuritis was noted.  CDAI Exam: CDAI Score: -- Patient Global: --; Provider Global: -- Swollen: --; Tender: -- Joint Exam 01/30/2022   No joint exam has been documented for this visit   There is currently no information documented on the homunculus. Go to the Rheumatology activity and complete the homunculus joint exam.  Investigation: No additional findings.  Imaging: XR Lumbar Spine 2-3 Views  Result Date:  01/28/2022 Mild degenerative changes L5-S1.  Mild facet disease.   Recent Labs: Lab Results  Component Value Date   WBC 9.2 09/26/2021   HGB 14.5 09/26/2021   PLT 299 09/26/2021   NA 139 09/26/2021   K 4.6 09/26/2021   CL 104 09/26/2021   CO2 26 09/26/2021   GLUCOSE 84 09/26/2021   BUN 11 09/26/2021   CREATININE 0.82 09/26/2021   BILITOT 0.8 09/26/2021   ALKPHOS 60 08/29/2020   AST 18 09/26/2021   ALT 16 09/26/2021   PROT 7.3 09/26/2021   ALBUMIN 3.7 08/29/2020   CALCIUM 9.5 09/26/2021   GFRAA 96 07/26/2020   QFTBGOLDPLUS NEGATIVE 09/12/2020    Speciality Comments: PLQ Eye Exam: 08/08/2020 WNL @ [facility] Follow up 6 months  Fosamax 11/21  Procedures:  No procedures performed Allergies: Boniva [ibandronate sodium], Ivp dye [iodinated contrast media], Other, Risedronate sodium, Risedronate sodium [risedronate sodium], and Macrobid [nitrofurantoin]   Assessment / Plan:     Visit Diagnoses: Rheumatoid arthritis of multiple sites with negative rheumatoid factor (HCC) - RF negative, anti-CCP positive, ANA positive.   -Patient states she has been having severe lower back pain.  None of the other joints are painful.  She continues to have some stiffness.  No synovitis was noted on the examination.  She requests to have a rheumatoid factor and sedimentation rate tested today.  She is currently on hydroxychloroquine 200 mg twice daily and methotrexate 8 tablets p.o. weekly which has been working well for her.  Plan: Sedimentation rate, Rheumatoid factor  High risk medication use - Plaquenil 200 mg by mouth daily, Methotrexate 8 tablets by mouth every week, and folic acid 2 mg by mouth daily.PLQ Eye Exam: 08/08/2020 -she was advised to get eye examination.  Sep 26, 2021 CBC and CMP were normal.  Sed rate was normal.  Plan: CBC with Differential/Platelet, COMPLETE METABOLIC PANEL WITH GFR.  She was advised to get labs every 3 months to monitor for drug toxicity.  Information on  immunization was discussed.  She was advised to hold methotrexate if she develops an infection and resume after the infection resolves.  Sicca syndrome (HCC)-over-the-counter products were discussed.  Primary osteoarthritis of both knees -she denies any  discomfort in her knee joints today.  X-rays showed bilateral moderate osteoarthritis and moderate chondromalacia patella.  No radiographic progression was noted when compared to the x-rays of 2021.  Pain of left heel-she can planes of intermittent left heel.  She has some tenderness over the lateral aspect of her left heel.  No synovitis was noted.  Primary osteoarthritis of both feet-she is osteoarthritic changes with no synovitis.  Acute midline low back pain without sciatica -is been having severe pain and discomfort in her back for the last 2 weeks.  She had tenderness over the lower lumbar region.  X-rays obtained by Dr. Erlinda Hong were reviewed.  She had a lumbar scoliosis and facet joint arthropathy.  Mild degenerative changes were noted.  She would benefit from physical therapy.  She was referred to physical therapy.  Patient requested a prednisone taper.  She was given prednisone taper starting at 20 mg and taper by 5 mg every 2 days.  I also advised her to fill the prescription for cyclobenzaprine 5 mg p.o. nightly which was given by Dr. Erlinda Hong.  Side effects of muscle relaxers were also discussed.  Plan: Ambulatory referral to Physical Therapy, predniSONE (DELTASONE) 5 MG tablet  DDD (degenerative disc disease), lumbar -patient was referred to physical therapy.  Plan: Ambulatory referral to Physical Therapy  Age related osteoporosis, unspecified pathological fracture presence - 02/02/20 T-score DualFemur Neck Left  02/02/2020 T score  -2.8, BMD 0.649 g/cm2. alendronate 70 mg p.o. weekly since 03/2020.Marland Kitchen  She will schedule a follow-up DEXA scan through her PCP.  Other medical problems listed as follows:  Fatty liver  Raised intraocular pressure of  both eyes  History of anemia  History of pulmonary embolism  History of migraine  History of hyperlipidemia  Frequent UTI  Orders: Orders Placed This Encounter  Procedures   CBC with Differential/Platelet   COMPLETE METABOLIC PANEL WITH GFR   Sedimentation rate   Rheumatoid factor   Ambulatory referral to Physical Therapy   Meds ordered this encounter  Medications   predniSONE (DELTASONE) 5 MG tablet    Sig: Take 4 tabs po qd x 2 days, 3  tabs po qd x 2 days, 2  tabs po qd x 2 days, 1  tab po qd x 2 days    Dispense:  20 tablet    Refill:  0     Follow-Up Instructions: Return in about 5 months (around 07/02/2022) for Rheumatoid arthritis.   Bo Merino, MD  Note - This record has been created using Editor, commissioning.  Chart creation errors have been sought, but may not always  have been located. Such creation errors do not reflect on  the standard of medical care.

## 2022-01-26 ENCOUNTER — Encounter: Payer: Self-pay | Admitting: Rheumatology

## 2022-01-27 ENCOUNTER — Telehealth: Payer: Self-pay | Admitting: Physician Assistant

## 2022-01-27 NOTE — Telephone Encounter (Signed)
Patient called the on-call service on Friday night.  Please see telephone encounter.  She was advised to go straight to the emergency department for further evaluation on Friday night due to severity of her symptoms.  Okay to schedule a sooner follow-up with Deveshwar this week but again I would advise further evaluation of her lower back pain prior to her visit with Dr. Estanislado Pandy.

## 2022-01-27 NOTE — Telephone Encounter (Signed)
Patient called the on-call service on Friday, September 01/24/22 at 7:00pm. I returned the patients call at 7:03 pm.  The patient was experiencing severe lower back pain which was causing pressure to radiate to her stomach per patient.  She had not tried taking any over-the-counter products for pain relief.  She was apprehensive to take Tylenol due to concurrent use with methotrexate. The patient was concerned that her rheumatoid arthritis was attacking her lower back and was requesting a prednisone taper.  I discussed that the patient will require further evaluation prior to receiving treatment.  Discussed that rheumatoid arthritis does not affect the lumbar spine so she will require further evaluation urgently.  I recommended being evaluated in the emergency department due to the severity of her symptoms as well as radiating pain to the epigastric region.  The patient declined going to the emergency department due to the concern for catching COVID 19.  I also suggested going to the after-hours orthopedic urgent care which she was considering.   I advised the patient to go straight to the emergency department if her symptoms persist or worsen. The patient is scheduled for an upcoming appointment on 01/30/2022 with Dr. Estanislado Pandy.  She requested to see Dr. Estanislado Pandy sooner if possible.  We will call to move her appointment up sooner but again she was advised to be evaluated prior to this appointment for the lower back pain she is experiencing.   Krystal Sams, [name]

## 2022-01-27 NOTE — Telephone Encounter (Signed)
Spoke with patient and advised patient per Lovena Le, she called the on-call service on Friday night.   Reminded patient she was advised to go straight to the emergency department for further evaluation on Friday night due to severity of her symptoms. Patient states she did not go to the emergency room.  Patient advised okay to schedule a sooner follow-up with Deveshwar this week but again Lovena Le would advise further evaluation of her lower back pain prior to her visit with Dr. Estanislado Pandy.  Patient provided numbers to 2 orthopedic offices to call and see if she can get an appointment with. Also advised patient that [facility] I[facility] has an urgent care after hours if they are unable to give her an appointment. Patient was insistent to have additional lab work. Advised patient she would need to have her back checked out prior to adding any lab work. Patient expressed understanding. Patient has an appointment with Dr. Estanislado Pandy on Thursday.

## 2022-01-28 ENCOUNTER — Ambulatory Visit (INDEPENDENT_AMBULATORY_CARE_PROVIDER_SITE_OTHER): Payer: BC Managed Care – PPO | Admitting: Orthopaedic Surgery

## 2022-01-28 ENCOUNTER — Ambulatory Visit: Payer: Self-pay

## 2022-01-28 ENCOUNTER — Ambulatory Visit (INDEPENDENT_AMBULATORY_CARE_PROVIDER_SITE_OTHER): Payer: BC Managed Care – PPO

## 2022-01-28 DIAGNOSIS — M545 Low back pain, unspecified: Secondary | ICD-10-CM | POA: Diagnosis not present

## 2022-01-28 MED ORDER — LIDOCAINE 5 % EX PTCH: 1.0000 | MEDICATED_PATCH | CUTANEOUS | 0 refills | Status: DC

## 2022-01-28 MED ORDER — CYCLOBENZAPRINE HCL 5 MG PO TABS: 2.5000 mg | ORAL_TABLET | Freq: Every evening | ORAL | 3 refills | Status: DC | PRN

## 2022-01-28 NOTE — Progress Notes (Signed)
Office Visit Note   Patient: [name]           Date of Birth: [birth year]           MRN: [id number] Visit Date: 01/28/2022              Requested by: Burnis Medin, MD [place],  [place] [id number] PCP: Burnis Medin, MD   Assessment & Plan: Visit Diagnoses:  1. Acute bilateral low back pain without sciatica     Plan: Impression is low back pain without radiculopathy.  At this point, this sounds somewhat inflammatory.  I discussed starting on a steroid taper and physical therapy.  She would like to wait and see her rheumatologist Thursday before starting any steroids.  She also notes she is immunocompromised and does not feel comfortable attending outpatient physical therapy.  We have provided her with a home exercise program.  Prescription for flexeril and lidoderm patches.  She will follow-up with [place] as needed.  Follow-Up Instructions: Return if symptoms worsen or fail to improve.   Orders:  Orders Placed This Encounter  Procedures   XR Lumbar Spine 2-3 Views   Meds ordered this encounter  Medications   cyclobenzaprine (FLEXERIL) 5 MG tablet    Sig: Take 0.5-1 tablets (2.5-5 mg total) by mouth at bedtime as needed for muscle spasms.    Dispense:  20 tablet    Refill:  3   lidocaine (LIDODERM) 5 %    Sig: Place 1 patch onto the skin daily. Remove & Discard patch within 12 hours or as directed by MD    Dispense:  30 patch    Refill:  0      Procedures: No procedures performed   Clinical Data: No additional findings.   Subjective: Chief Complaint  Patient presents with   Lower Back - Pain    HPI patient is a pleasant 61 year old female who comes in today with low back pain for the past 10 days.  She denies any injury or change in activity.  She does have a history of RA and is on methotrexate and Plaquenil.  The pain she is having is across the entire lower back.  Symptoms appear to occur when she is sitting.  She has been using  heat and [place] without significant relief.  She notes she is unable to take any other medication for pain as she has upcoming lab work with her rheumatologist this Thursday and does not want to skew the results.  She denies any paresthesias to either lower extremity.  She does tell me that she has been constipated for the past few weeks has increased pain to her back when she is full from a meal.  Review of Systems as detailed in HPI.  All others reviewed and are negative.   Objective: Vital Signs: There were no vitals taken for this visit.  Physical Exam well-developed well-nourished female in no acute distress.  Alert and oriented x3.  Ortho Exam lumbar spine exam shows mild and diffuse tenderness to the paraspinous musculature.  No spinous tenderness.  Negative straight leg raise either side.  No pain with lumbar flexion or extension.  No focal weakness.  She is neurovascular tact distally.  Specialty Comments:  No specialty comments available.  Imaging: XR Lumbar Spine 2-3 Views  Result Date: 01/28/2022 Mild degenerative changes L5-S1.  Mild facet disease.    PMFS History: Patient Active Problem List   Diagnosis Date Noted   Sicca  syndrome with lung involvement ([place]) 01/06/2018   Long term (current) use of anticoagulants 02/11/2017   Sicca syndrome ([place]) 12/22/2016   ANA positive 12/22/2016   Raised intraocular pressure of both eyes 12/22/2016   Migraine 03/31/2016   Anxiety 03/31/2016   High risk medication use 03/31/2016   Primary osteoarthritis of both feet 03/31/2016   Primary osteoarthritis of both knees 03/31/2016   Stiffness of left hand joint 11/27/2014   Elevated IOP 06/09/2014   Long term current use of systemic steroids 06/09/2014   Medically complex patient 06/09/2014   Current use of steroid medication 01/09/2014   Xerosis of skin 12/07/2013   Encounter for therapeutic drug monitoring 06/09/2013   B12 deficiency 05/31/2013   Iron deficiency 05/31/2013    Visit for preventive health examination 05/25/2012   Mammogram abnormal 04/12/2012   Iliac crest  pain 01/23/2012   Vaginal atrophy 01/23/2012   History of recurrent UTI (urinary tract infection) 01/23/2012   Osteoporosis 04/29/2011   Nonspecific abnormal results of liver function study 12/08/2010   Chronic anticoagulation    Anticoagulant long-term use 07/15/2010   Anticardiolipin antibody positive 07/15/2010   Chronic cough 03/19/2009   CHEST PAIN-UNSPECIFIED 08/26/2008   PALPITATIONS, RECURRENT 07/24/2008   Vitamin D deficiency 05/30/2008   HYPERLIPIDEMIA 05/30/2008   IRON DEFICIENCY 05/30/2008   Adjustment disorder with anxiety 05/30/2008   Rheumatoid arthritis ([place]) 05/30/2008   PULMONARY EMBOLISM, HX OF 05/30/2008   Past Medical History:  Diagnosis Date   Adjustment disorder with anxiety    B12 deficiency due to diet    is a vegetarian   Chest pain, unspecified    Chronic anticoagulation    Fatty liver    Per patient   GERD (gastroesophageal reflux disease)    Headache(784.0)    migraines and head pain   Hx of varicella    As Child   Iron deficiency anemia, unspecified    Otalgia, unspecified    Other and unspecified hyperlipidemia    Palpitations    Personal history of venous thrombosis and embolism    Recurrent UTI    Rheumatoid arthritis(714.0)    deveshewar   Unspecified vitamin D deficiency    UTI (lower urinary tract infection) 11/28/2011   citrobacter       Family History  Problem Relation Age of Onset   Parkinsonism Mother    Dementia Father    Stroke Father    Hyperlipidemia Father    Diabetes Father    Lymphoma Other    Colon cancer Neg Hx    Colon polyps Neg Hx    Esophageal cancer Neg Hx    Stomach cancer Neg Hx    Rectal cancer Neg Hx    Breast cancer Neg Hx     Past Surgical History:  Procedure Laterality Date   ORIF ULNAR FRACTURE     TYMPANOSTOMY TUBE PLACEMENT  2009   Right   Social History   Occupational History    Occupation: home maker  Tobacco Use   Smoking status: Never   Smokeless tobacco: Never  Vaping Use   Vaping Use: Never used  Substance and Sexual Activity   Alcohol use: No   Drug use: Never   Sexual activity: Not on file

## 2022-01-29 ENCOUNTER — Ambulatory Visit (INDEPENDENT_AMBULATORY_CARE_PROVIDER_SITE_OTHER): Payer: BC Managed Care – PPO | Admitting: Psychology

## 2022-01-29 DIAGNOSIS — F4322 Adjustment disorder with anxiety: Secondary | ICD-10-CM

## 2022-01-29 NOTE — Progress Notes (Signed)
01/29/2022  Treatrment Plan:  Diagnosis F41.1 (Generalized anxiety disorder) [n/a]  Z62.820 (Parent child relationship problem) [n/a]  Symptoms Excessive and/or unrealistic worry that is difficult to control occurring more days than not for at least 6 months about a number of events or activities. (Status: maintained) -- No Description Entered  Regularly overindulges their child's wishes and demands. (Status: maintained) -- No Description Entered  Medication Status compliance  Safety none  If Suicidal or Homicidal State Action Taken: unspecified  Current Risk: low Medications unspecified Objectives Related Problem: Achieve a level of competent, effective parenting. Description: Increase the gradual letting go of their adolescent in constructive, affirmative ways. Target Date: 2022-05-14 Frequency: Daily Modality: individual Progress: 80%  Related Problem: Achieve a level of competent, effective parenting. Description: Identify unresolved childhood issues that affect parenting and work toward their resolution. Target Date: 2022-05-14 Frequency: Daily Modality: individual Progress: 50%  Related Problem: Achieve a level of competent, effective parenting. Description: Freely express feelings of frustration, helplessness, and inadequacy that each experiences in the parenting role. Target Date: 2022-05-14 Frequency: Daily Modality: individual Progress: 80%  Related Problem: Resolve the core conflict that is the source of anxiety. Description: Learn and implement problem-solving strategies for realistically addressing worries. Target Date: 2022-05-14 Frequency: Daily Modality: individual Progress: 70%  Related Problem: Resolve the core conflict that is the source of anxiety. Description: Learn and implement calming skills to reduce overall anxiety and manage anxiety symptoms. Target Date: 2022-05-14 Frequency:  Daily Modality: individual Progress: 70%  Related Problem: Resolve the core conflict that is the source of anxiety. Description: Describe situations, thoughts, feelings, and actions associated with anxieties and worries, their impact on functioning, and attempts to resolve them. Target Date: 2022-05-14 Frequency: Daily Modality: individual Progress: 60%  Client Response full compliance  Service Location Location, [address] [place], [place] [address]  Service Code cpt [phone]  Behavioral activation plan  Facilitate problem solving  Identify automatic thoughts  Emotion regulation skills  Provide education, information  Self care activities  Relaxation training  Lifestyle change (exercise, nutrition)  Self-monitoring  Validate/empathize  Session notes: F 41.1  Goals: She has chronic anxieties, mostly related to health concerns and her son's well-being. Would like to develop strategies to manage symptoms of anxiety. Needs to understand enmeshment and learn to define and manage appropriate family boundaries. Target date is 12-23. Patient has realized significant improvement in recognizing and establishing appropriate boundaries. Anxiety about health and family matters persist and she desires to continue treatment in an effort to manage and reduce these feelings. Target date is 12-23.  Meds: Xanax (.25 mg)  Patient agrees to a video session. She is at home and I am in my home office.  Janellie and [name]: [name] is 2 weeks into classes. He saw a psychiatrist and has started Lexapro (started at 2.'5mg'$  and will eventually go to '10mg'$ ). Initial appointment was 2 hours. The doctor felt he was more anxious than depressed. He will follow up with doctor after the first term is over. He has had a flare of heart burn and it is cause him some anxiety.  He did attend some social events at school. He says that "they went well" and he enjoyed seeing people. He felt it was "good for his mental  health" to go rather than stay home  as he most often does. He still struggles to give himself permission to "relax and enjoy" himself. He feels that the expectations he places on himself makes it difficult to consider doing anything other than study. Kaula expresses very little confidence that [name] can be independently successful. She feels she needs to be nearby to support and help him. We talked about the best plan to move his interests forward. Also, the need to express greater confidence in son's ability to care for himself and make good deisions.   Marcelina Morel, PhD 2:10p-3:00p 50 min

## 2022-01-30 ENCOUNTER — Ambulatory Visit: Payer: BC Managed Care – PPO | Attending: Rheumatology | Admitting: Rheumatology

## 2022-01-30 ENCOUNTER — Telehealth: Payer: Self-pay

## 2022-01-30 ENCOUNTER — Encounter: Payer: Self-pay | Admitting: Rheumatology

## 2022-01-30 VITALS — BP 115/75 | HR 54 | Ht 63.5 in | Wt 159.8 lb

## 2022-01-30 DIAGNOSIS — K760 Fatty (change of) liver, not elsewhere classified: Secondary | ICD-10-CM

## 2022-01-30 DIAGNOSIS — Z86711 Personal history of pulmonary embolism: Secondary | ICD-10-CM

## 2022-01-30 DIAGNOSIS — Z79899 Other long term (current) drug therapy: Secondary | ICD-10-CM | POA: Diagnosis not present

## 2022-01-30 DIAGNOSIS — M3500 Sicca syndrome, unspecified: Secondary | ICD-10-CM

## 2022-01-30 DIAGNOSIS — M170 Bilateral primary osteoarthritis of knee: Secondary | ICD-10-CM | POA: Diagnosis not present

## 2022-01-30 DIAGNOSIS — H40053 Ocular hypertension, bilateral: Secondary | ICD-10-CM

## 2022-01-30 DIAGNOSIS — Z8639 Personal history of other endocrine, nutritional and metabolic disease: Secondary | ICD-10-CM

## 2022-01-30 DIAGNOSIS — M545 Low back pain, unspecified: Secondary | ICD-10-CM

## 2022-01-30 DIAGNOSIS — M19072 Primary osteoarthritis, left ankle and foot: Secondary | ICD-10-CM

## 2022-01-30 DIAGNOSIS — M5136 Other intervertebral disc degeneration, lumbar region: Secondary | ICD-10-CM

## 2022-01-30 DIAGNOSIS — Z8669 Personal history of other diseases of the nervous system and sense organs: Secondary | ICD-10-CM

## 2022-01-30 DIAGNOSIS — M0609 Rheumatoid arthritis without rheumatoid factor, multiple sites: Secondary | ICD-10-CM

## 2022-01-30 DIAGNOSIS — Z862 Personal history of diseases of the blood and blood-forming organs and certain disorders involving the immune mechanism: Secondary | ICD-10-CM

## 2022-01-30 DIAGNOSIS — N390 Urinary tract infection, site not specified: Secondary | ICD-10-CM

## 2022-01-30 DIAGNOSIS — M810 Age-related osteoporosis without current pathological fracture: Secondary | ICD-10-CM

## 2022-01-30 DIAGNOSIS — M19071 Primary osteoarthritis, right ankle and foot: Secondary | ICD-10-CM

## 2022-01-30 DIAGNOSIS — M79672 Pain in left foot: Secondary | ICD-10-CM

## 2022-01-30 MED ORDER — PREDNISONE 5 MG PO TABS: ORAL_TABLET | ORAL | 0 refills | Status: DC

## 2022-01-30 NOTE — Telephone Encounter (Signed)
Pt reports she has been prescribed prednisone x 8 days and cyclobenzaprine but is not sure if she will take the muscle relaxer. She is inquiring if they interact with warfarin. Advised prednisone and increase or decrease INR or have not interaction. Cyclobenzaprine does not have any interaction.  Advised will check INR on 9/20 at her already scheduled coumadin clinic apt. Advised if any changes to contact office. Pt verbalized understanding.

## 2022-01-31 ENCOUNTER — Telehealth: Payer: Self-pay | Admitting: Rheumatology

## 2022-01-31 LAB — CBC WITH DIFFERENTIAL/PLATELET
Absolute Monocytes: 511 cells/uL (ref 200–950)
Basophils Absolute: 48 cells/uL (ref 0–200)
Basophils Relative: 0.7 %
Eosinophils Absolute: 83 cells/uL (ref 15–500)
Eosinophils Relative: 1.2 %
HCT: 45.6 % — ABNORMAL HIGH (ref 35.0–45.0)
Hemoglobin: 15 g/dL (ref 11.7–15.5)
Lymphs Abs: 2663 cells/uL (ref 850–3900)
MCH: 27.9 pg (ref 27.0–33.0)
MCHC: 32.9 g/dL (ref 32.0–36.0)
MCV: 84.9 fL (ref 80.0–100.0)
MPV: 12.2 fL (ref 7.5–12.5)
Monocytes Relative: 7.4 %
Neutro Abs: 3595 cells/uL (ref 1500–7800)
Neutrophils Relative %: 52.1 %
Platelets: 259 10*3/uL (ref 140–400)
RBC: 5.37 10*6/uL — ABNORMAL HIGH (ref 3.80–5.10)
RDW: 13.1 % (ref 11.0–15.0)
Total Lymphocyte: 38.6 %
WBC: 6.9 10*3/uL (ref 3.8–10.8)

## 2022-01-31 LAB — RHEUMATOID FACTOR: Rheumatoid fact SerPl-aCnc: 32 IU/mL — ABNORMAL HIGH (ref <14)

## 2022-01-31 LAB — COMPLETE METABOLIC PANEL WITH GFR
AG Ratio: 1.4 (calc) (ref 1.0–2.5)
ALT: 16 U/L (ref 6–29)
AST: 21 U/L (ref 10–35)
Albumin: 4.2 g/dL (ref 3.6–5.1)
Alkaline phosphatase (APISO): 79 U/L (ref 37–153)
BUN: 10 mg/dL (ref 7–25)
CO2: 28 mmol/L (ref 20–32)
Calcium: 9.4 mg/dL (ref 8.6–10.4)
Chloride: 104 mmol/L (ref 98–110)
Creat: 0.9 mg/dL (ref 0.50–1.05)
Globulin: 2.9 g/dL (calc) (ref 1.9–3.7)
Glucose, Bld: 91 mg/dL (ref 65–99)
Potassium: 4.5 mmol/L (ref 3.5–5.3)
Sodium: 140 mmol/L (ref 135–146)
Total Bilirubin: 0.7 mg/dL (ref 0.2–1.2)
Total Protein: 7.1 g/dL (ref 6.1–8.1)
eGFR: 72 mL/min/{1.73_m2} (ref >=60)

## 2022-01-31 LAB — SEDIMENTATION RATE: Sed Rate: 14 mm/h (ref 0–30)

## 2022-01-31 NOTE — Progress Notes (Signed)
CBC and CMP are normal.  Her rheumatoid factor is positive but lower titer.  Sed rate is normal.

## 2022-01-31 NOTE — Telephone Encounter (Signed)
Patient called the office stating she had missed a call from [place] and requests a return call.

## 2022-02-05 ENCOUNTER — Ambulatory Visit (INDEPENDENT_AMBULATORY_CARE_PROVIDER_SITE_OTHER): Payer: BC Managed Care – PPO

## 2022-02-05 DIAGNOSIS — Z7901 Long term (current) use of anticoagulants: Secondary | ICD-10-CM

## 2022-02-05 LAB — POCT INR: INR: 2 (ref 2.0–3.0)

## 2022-02-05 NOTE — Progress Notes (Cosign Needed Addendum)
Pt reports taking last dose of prednisone taper today.    Continue  5 mg (2 tablets) daily except take 2.5 mg (1  tablets) on Sundays and take 3.75 mg (1 1/2  tablet) on Thursdays. Recheck in 3 week, 02/26/22.

## 2022-02-05 NOTE — Patient Instructions (Addendum)
Continue  5 mg (2 tablets) daily except take 2.5 mg (1  tablets) on Sundays and take 3.75 mg (1 1/2  tablet) on Thursdays. Recheck in 3 week, 02/26/22.

## 2022-02-11 ENCOUNTER — Other Ambulatory Visit: Payer: Self-pay

## 2022-02-11 ENCOUNTER — Ambulatory Visit: Payer: BC Managed Care – PPO | Attending: Rheumatology

## 2022-02-11 DIAGNOSIS — M6281 Muscle weakness (generalized): Secondary | ICD-10-CM

## 2022-02-11 DIAGNOSIS — R262 Difficulty in walking, not elsewhere classified: Secondary | ICD-10-CM

## 2022-02-11 DIAGNOSIS — R293 Abnormal posture: Secondary | ICD-10-CM

## 2022-02-11 DIAGNOSIS — M545 Low back pain, unspecified: Secondary | ICD-10-CM | POA: Diagnosis not present

## 2022-02-11 DIAGNOSIS — M5136 Other intervertebral disc degeneration, lumbar region: Secondary | ICD-10-CM | POA: Insufficient documentation

## 2022-02-11 DIAGNOSIS — R252 Cramp and spasm: Secondary | ICD-10-CM

## 2022-02-11 DIAGNOSIS — M5459 Other low back pain: Secondary | ICD-10-CM

## 2022-02-11 NOTE — Therapy (Signed)
OUTPATIENT PHYSICAL THERAPY THORACOLUMBAR EVALUATION   Patient Name: Senovia Gauer MRN: [id number] DOB:02-May-1961, 61 y.o., female Today's Date: 02/11/2022   PT End of Session - 02/11/22 1021     Visit Number 1    Date for PT Re-Evaluation 04/08/22    Authorization Type BCBS    PT Start Time 1015    PT Stop Time 1100    PT Time Calculation (min) 45 min    Activity Tolerance Patient limited by pain;Patient limited by fatigue    Behavior During Therapy [facility] for tasks assessed/performed             Past Medical History:  Diagnosis Date   Adjustment disorder with anxiety    B12 deficiency due to diet    is a vegetarian   Chest pain, unspecified    Chronic anticoagulation    Fatty liver    Per patient   GERD (gastroesophageal reflux disease)    Headache(784.0)    migraines and head pain   Hx of varicella    As Child   Iron deficiency anemia, unspecified    Otalgia, unspecified    Other and unspecified hyperlipidemia    Palpitations    Personal history of venous thrombosis and embolism    Recurrent UTI    Rheumatoid arthritis(714.0)    deveshewar   Unspecified vitamin D deficiency    UTI (lower urinary tract infection) 11/28/2011   citrobacter      Past Surgical History:  Procedure Laterality Date   ORIF ULNAR FRACTURE     TYMPANOSTOMY TUBE PLACEMENT  2009   Right   Patient Active Problem List   Diagnosis Date Noted   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[place]) 01/06/2018   Long term (current) use of anticoagulants 02/11/2017   Sicca syndrome ([place]) 12/22/2016   ANA positive 12/22/2016   Raised intraocular pressure of both eyes 12/22/2016   Migraine 03/31/2016   Anxiety 03/31/2016   High risk medication use 03/31/2016   Primary osteoarthritis of both feet 03/31/2016   Primary osteoarthritis of both knees 03/31/2016   Stiffness of left hand joint 11/27/2014   Elevated IOP 06/09/2014   Long term current use of systemic steroids 06/09/2014   Medically  complex patient 06/09/2014   Current use of steroid medication 01/09/2014   Xerosis of skin 12/07/2013   Encounter for therapeutic drug monitoring 06/09/2013   B12 deficiency 05/31/2013   Iron deficiency 05/31/2013   Visit for preventive health examination 05/25/2012   Mammogram abnormal 04/12/2012   Iliac crest  pain 01/23/2012   Vaginal atrophy 01/23/2012   History of recurrent UTI (urinary tract infection) 01/23/2012   Osteoporosis 04/29/2011   Nonspecific abnormal results of liver function study 12/08/2010   Chronic anticoagulation    Anticoagulant long-term use 07/15/2010   Anticardiolipin antibody positive 07/15/2010   Chronic cough 03/19/2009   CHEST PAIN-UNSPECIFIED 08/26/2008   PALPITATIONS, RECURRENT 07/24/2008   Vitamin D deficiency 05/30/2008   HYPERLIPIDEMIA 05/30/2008   IRON DEFICIENCY 05/30/2008   Adjustment disorder with anxiety 05/30/2008   Rheumatoid arthritis ([place]) 05/30/2008   PULMONARY EMBOLISM, HX OF 05/30/2008    PCP: Burnis Medin, MD   REFERRING PROVIDER: Bo Merino, MD   REFERRING DIAG: M51.36 (ICD-10-CM) - DDD (degenerative disc disease), lumbar M54.50 (ICD-10-CM) - Acute midline low back pain without sciatica   Rationale for Evaluation and Treatment Rehabilitation  THERAPY DIAG:  Other low back pain  Abnormal posture  Cramp and spasm  Muscle weakness (generalized)  Difficulty in walking, not  elsewhere classified  ONSET DATE: 01/30/2022   SUBJECTIVE:                                                                                                                                                                                           SUBJECTIVE STATEMENT: Patient reports pain in low back for about 4 weeks.  She saw MD vs going to ER due to very fearful of contracting Covid.  Walks for exercise about 45 min to one hour. She is working on losing some weight.   She used to go to [facility] but is too fearful of contracting Covid now.  She  wears N-95 mask today.  She reports she is immunocompromised, has RA and also has osteoporosis.  She is retired.  She enjoys cooking and gardening.  She explains that she is having constant pain and was concerned that her RA was causing her back pain.  MD explained that her back issues were not typically related to RA.  She hopes to eliminate her back pain and be able to do her normal level of activity.    PERTINENT HISTORY:  Most recent MD note:  Sherald Balbuena is a 61 y.o. female with history of seronegative rheumatoid arthritis.  According the patient about 2 weeks ago she started having lower back pain which gradually got worse.  She did not want to go to the emergency room as she was concerned about COVID-19 virus exposure.  She was seen by Dr.Xu on January 28, 2022.  She had x-rays of her lumbar spine which showed some degenerative changes.  She was offered a prednisone taper which she declined.  He also gave her prescription for Flexeril 5 mg which she has not started yet.  She states the Lidoderm patches were not covered by her insurance.  She continues to have lower back pain.  None of the other joints are painful or swollen.  She states that she had some discomfort in her left heel which comes and goes.  She has been taking methotrexate 8 tablets p.o. weekly and hydroxychloroquine 1 tablet p.o. twice daily Monday to Friday  PAIN:  Are you having pain? Yes: NPRS scale: 2/10 Pain location: low back Pain description: aching Aggravating factors: standing, sitting Relieving factors: heat prednisone dose pack   PRECAUTIONS: None  WEIGHT BEARING RESTRICTIONS No  FALLS:  Has patient fallen in last 6 months? No  LIVING ENVIRONMENT: Lives with: lives with their spouse Lives in: House/apartment Stairs: Yes: Internal: 12 steps; can reach both Has following equipment at home: None  OCCUPATION: Retired  PLOF: Independent, Independent with basic ADLs, Independent with household mobility  without device, Independent with community mobility without device, Independent with homemaking with ambulation, Independent with gait, and Independent with transfers  PATIENT GOALS "I just want to be pain free"   OBJECTIVE:   DIAGNOSTIC FINDINGS:  Xray 01/28/22 Mild degenerative changes L5-S1.  Mild facet disease  PATIENT SURVEYS:  FOTO 57 goal is 107  SCREENING FOR RED FLAGS: Bowel or bladder incontinence: No Spinal tumors: No Cauda equina syndrome: No Compression fracture: No Abdominal aneurysm: No  COGNITION:  Overall cognitive status: Within functional limits for tasks assessed     SENSATION: WFL  MUSCLE LENGTH: Hamstrings: Right 45 deg; Left 50 deg Thomas test: Right pos ; Left pos   POSTURE: increased lumbar lordosis   LUMBAR ROM:   Active  A/PROM  eval  Flexion WNL  Extension WNL  Right lateral flexion Fingertips to distal thigh  Left lateral flexion Fingertips to joint line  Right rotation WNL  Left rotation WNL   (Blank rows = not tested)  LOWER EXTREMITY ROM:     WFL  LOWER EXTREMITY MMT:    Generally 4-/5 bilateral LE's  LUMBAR SPECIAL TESTS:  Straight leg raise test: Negative, Slump test: Negative, Stork standing: not tested, and FABER test: Negative  FUNCTIONAL TESTS:  5 times sit to stand: test next visit Timed up and go (TUG): test next visit  GAIT: Distance walked: 50 Assistive device utilized: None Level of assistance: Complete Independence Comments: antalgic, guarded    TODAY'S TREATMENT  Initial eval completed and initiated HEP   PATIENT EDUCATION:  Education details: Initiated HEP Person educated: Patient Education method: Consulting civil engineer, Media planner, Verbal cues, and Handouts Education comprehension: verbalized understanding, returned demonstration, and verbal cues required   HOME EXERCISE PROGRAM: Access Code: [email or web address] URL: [email or web address] Date: 02/11/2022 Prepared by: Candyce Churn  Exercises - Supine Hamstring Stretch with Strap  - 1 x daily - 7 x weekly - 1 sets - 3 reps - 30 sec hold - Supine Quadriceps Stretch with Strap on Table  - 1 x daily - 7 x weekly - 1 sets - 3 reps - 30 sec hold - Supine ITB Stretch with Strap  - 1 x daily - 7 x weekly - 1 sets - 3 reps - 30 sec hold - Supine Piriformis Stretch Pulling Heel to Hip  - 1 x daily - 7 x weekly - 1 sets - 3 reps - 30 sec hold - Supine Posterior Pelvic Tilt  - 1 x daily - 7 x weekly - 1 sets - 20 reps - Standing Quad Stretch with Table and Chair Support  - 1 x daily - 7 x weekly - 1 sets - 3 reps - 30 sec holdAccess Code: [email or web address] URL: [email or web address] Date: 02/11/2022 Prepared by: Candyce Churn  Exercises - Supine Hamstring Stretch with Strap  - 1 x daily - 7 x weekly - 1 sets - 3 reps - 30 sec hold - Supine Quadriceps Stretch with Strap on Table  - 1 x daily - 7 x weekly - 1 sets - 3 reps - 30 sec hold - Supine ITB Stretch with Strap  - 1 x daily - 7 x weekly - 1 sets - 3 reps - 30 sec hold - Supine Piriformis Stretch Pulling Heel to Hip  - 1 x daily - 7 x weekly - 1 sets - 3 reps - 30 sec hold - Supine Posterior Pelvic Tilt  - 1 x daily - 7 x weekly - 1 sets - 20 reps Elimimated Supine quad stretch  ASSESSMENT:  CLINICAL IMPRESSION: Patient is a 61 y.o. female who was seen today for physical therapy evaluation and treatment for Low back pain.  She presents with tight hamstrings, hip flexors/quads and IT bands bilaterally.  Core also appears weak as demonstrated by patients difficulty with coming to sit from supine. Lumbar ROM limited in side bending, xrays show mild scoliosis.   No radicular symptoms present today but overall decreased strength and function along with elevated pain.     OBJECTIVE IMPAIRMENTS Abnormal gait, decreased mobility, difficulty walking, decreased ROM, decreased strength, increased fascial restrictions, increased muscle spasms, impaired flexibility,  postural dysfunction, obesity, and pain.   ACTIVITY LIMITATIONS carrying, lifting, bending, sitting, standing, squatting, sleeping, stairs, transfers, bed mobility, and dressing  PARTICIPATION LIMITATIONS: meal prep, cleaning, laundry, driving, shopping, community activity, and yard work  PERSONAL FACTORS Behavior pattern, Fitness, Past/current experiences, Time since onset of injury/illness/exacerbation, and 1-2 comorbidities: chronic pain, RA  are also affecting patient's functional outcome.   REHAB POTENTIAL: Good  CLINICAL DECISION MAKING: Evolving/moderate complexity  EVALUATION COMPLEXITY: Moderate   GOALS: Goals reviewed with patient? Yes  SHORT TERM GOALS: Target date: 03/11/2022  Pain report to be no greater than 4/10  Baseline: Goal status: INITIAL  2.  Patient will be independent with initial HEP  Baseline:  Goal status: INITIAL  3.  Patient to be able to stand or walk for at least 15 min without leg pain  Baseline:  Goal status: INITIAL   LONG TERM GOALS: Target date: 04/08/2022  Patient to report pain no greater than 2/10  Baseline:  Goal status: INITIAL  2.  Patient to be independent with advanced HEP  Baseline:  Goal status: INITIAL  3.  Patient to be able to stand or walk for at least 30 min without pain Baseline:  Goal status: INITIAL  4.  FOTO to be Baseline:  Goal status: INITIAL  5.  5 times sit to stand to improve by 2 seconds Baseline:  Goal status: INITIAL  6.  TUG test to improve by 2 seconds Baseline:  Goal status: INITIAL   PLAN: PT FREQUENCY: 2x/week  PT DURATION: 8 weeks  PLANNED INTERVENTIONS: Therapeutic exercises, Therapeutic activity, Neuromuscular re-education, Balance training, Gait training, Patient/Family education, Self Care, Joint mobilization, Stair training, DME instructions, Aquatic Therapy, Dry Needling, Electrical stimulation, Spinal mobilization, Cryotherapy, Moist heat, Taping, Traction, Ultrasound,  Ionotophoresis '4mg'$ /ml Dexamethasone, Manual therapy, and Re-evaluation.  PLAN FOR NEXT SESSION: Review HEP, Nustep, begin core strengthening.     Isabel Caprice, PT 02/11/2022, 11:05 PM

## 2022-02-12 ENCOUNTER — Ambulatory Visit: Payer: BC Managed Care – PPO | Admitting: Psychology

## 2022-02-12 ENCOUNTER — Encounter: Payer: Self-pay | Admitting: Internal Medicine

## 2022-02-13 ENCOUNTER — Ambulatory Visit: Payer: BC Managed Care – PPO

## 2022-02-13 DIAGNOSIS — M6281 Muscle weakness (generalized): Secondary | ICD-10-CM

## 2022-02-13 DIAGNOSIS — M5459 Other low back pain: Secondary | ICD-10-CM

## 2022-02-13 DIAGNOSIS — R252 Cramp and spasm: Secondary | ICD-10-CM

## 2022-02-13 DIAGNOSIS — R293 Abnormal posture: Secondary | ICD-10-CM

## 2022-02-13 DIAGNOSIS — M5136 Other intervertebral disc degeneration, lumbar region: Secondary | ICD-10-CM | POA: Diagnosis not present

## 2022-02-13 NOTE — Therapy (Signed)
OUTPATIENT PHYSICAL THERAPY TREATMENT   Patient Name: [name] MRN: [id number] DOB:[birth year], 61 y.o., female Today's Date: 02/13/2022   PT End of Session - 02/13/22 1013     Visit Number 2    Date for PT Re-Evaluation 04/08/22    Authorization Type [employer]    PT Start Time 0932    PT Stop Time 1012    PT Time Calculation (min) 40 min    Activity Tolerance Patient tolerated treatment well    Behavior During Therapy WFL for tasks assessed/performed              Past Medical History:  Diagnosis Date   Adjustment disorder with anxiety    B12 deficiency due to diet    is a vegetarian   Chest pain, unspecified    Chronic anticoagulation    Fatty liver    Per patient   GERD (gastroesophageal reflux disease)    Headache(784.0)    migraines and head pain   Hx of varicella    As Child   Iron deficiency anemia, unspecified    Otalgia, unspecified    Other and unspecified hyperlipidemia    Palpitations    Personal history of venous thrombosis and embolism    Recurrent UTI    Rheumatoid arthritis(714.0)    deveshewar   Unspecified vitamin D deficiency    UTI (lower urinary tract infection) 11/28/2011   citrobacter      Past Surgical History:  Procedure Laterality Date   ORIF ULNAR FRACTURE     TYMPANOSTOMY TUBE PLACEMENT  2009   Right   Patient Active Problem List   Diagnosis Date Noted   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[place]) 01/06/2018   Long term (current) use of anticoagulants 02/11/2017   Sicca syndrome ([place]) 12/22/2016   ANA positive 12/22/2016   Raised intraocular pressure of both eyes 12/22/2016   Migraine 03/31/2016   Anxiety 03/31/2016   High risk medication use 03/31/2016   Primary osteoarthritis of both feet 03/31/2016   Primary osteoarthritis of both knees 03/31/2016   Stiffness of left hand joint 11/27/2014   Elevated IOP 06/09/2014   Long term current use of systemic steroids 06/09/2014   Medically complex patient 06/09/2014    Current use of steroid medication 01/09/2014   Xerosis of skin 12/07/2013   Encounter for therapeutic drug monitoring 06/09/2013   B12 deficiency 05/31/2013   Iron deficiency 05/31/2013   Visit for preventive health examination 05/25/2012   Mammogram abnormal 04/12/2012   Iliac crest  pain 01/23/2012   Vaginal atrophy 01/23/2012   History of recurrent UTI (urinary tract infection) 01/23/2012   Osteoporosis 04/29/2011   Nonspecific abnormal results of liver function study 12/08/2010   Chronic anticoagulation    Anticoagulant long-term use 07/15/2010   Anticardiolipin antibody positive 07/15/2010   Chronic cough 03/19/2009   CHEST PAIN-UNSPECIFIED 08/26/2008   PALPITATIONS, RECURRENT 07/24/2008   Vitamin D deficiency 05/30/2008   HYPERLIPIDEMIA 05/30/2008   IRON DEFICIENCY 05/30/2008   Adjustment disorder with anxiety 05/30/2008   Rheumatoid arthritis ([place]) 05/30/2008   PULMONARY EMBOLISM, HX OF 05/30/2008    PCP: Burnis Medin, MD   REFERRING PROVIDER: Bo Merino, MD   REFERRING DIAG: M51.36 (ICD-10-CM) - DDD (degenerative disc disease), lumbar M54.50 (ICD-10-CM) - Acute midline low back pain without sciatica   Rationale for Evaluation and Treatment Rehabilitation  THERAPY DIAG:  Other low back pain  Abnormal posture  Cramp and spasm  Muscle weakness (generalized)  ONSET DATE: 01/30/2022   SUBJECTIVE:  SUBJECTIVE STATEMENT: I've been doing the exercises.  I feel better in the morning and then it gets worse as the day progresses.     PERTINENT HISTORY:  Most recent MD note:  [name] is a 61 y.o. female with history of seronegative rheumatoid arthritis.  According the patient about 2 weeks ago she started having lower back pain which gradually got worse.  She did not  want to go to the emergency room as she was concerned about COVID-19 virus exposure.  She was seen by Dr.Xu on January 28, 2022.  She had x-rays of her lumbar spine which showed some degenerative changes.  She was offered a prednisone taper which she declined.  He also gave her prescription for Flexeril 5 mg which she has not started yet.  She states the Lidoderm patches were not covered by her insurance.  She continues to have lower back pain.  None of the other joints are painful or swollen.  She states that she had some discomfort in her left heel which comes and goes.  She has been taking methotrexate 8 tablets p.o. weekly and hydroxychloroquine 1 tablet p.o. twice daily Monday to Friday  PAIN:  Are you having pain? Yes: NPRS scale: 2/10 Pain location: low back Pain description: aching Aggravating factors: standing, sitting Relieving factors: heat prednisone dose pack   PRECAUTIONS: None  WEIGHT BEARING RESTRICTIONS No  FALLS:  Has patient fallen in last 6 months? No  LIVING ENVIRONMENT: Lives with: lives with their spouse Lives in: House/apartment Stairs: Yes: Internal: 12 steps; can reach both Has following equipment at home: None  OCCUPATION: Retired  PLOF: Independent, Independent with basic ADLs, Independent with household mobility without device, Independent with community mobility without device, Independent with homemaking with ambulation, Independent with gait, and Independent with transfers  PATIENT GOALS "I just want to be pain free"   OBJECTIVE:   DIAGNOSTIC FINDINGS:  Xray 01/28/22 Mild degenerative changes L5-S1.  Mild facet disease  PATIENT SURVEYS:  FOTO 57 goal is 55  SCREENING FOR RED FLAGS: Bowel or bladder incontinence: No Spinal tumors: No Cauda equina syndrome: No Compression fracture: No Abdominal aneurysm: No  COGNITION:  Overall cognitive status: Within functional limits for tasks assessed     SENSATION: WFL  MUSCLE  LENGTH: Hamstrings: Right 45 deg; Left 50 deg Thomas test: Right pos ; Left pos   POSTURE: increased lumbar lordosis   LUMBAR ROM:   Active  A/PROM  eval  Flexion WNL  Extension WNL  Right lateral flexion Fingertips to distal thigh  Left lateral flexion Fingertips to joint line  Right rotation WNL  Left rotation WNL   (Blank rows = not tested)  LOWER EXTREMITY ROM:     WFL  LOWER EXTREMITY MMT:    Generally 4-/5 bilateral LE's  LUMBAR SPECIAL TESTS:  Straight leg raise test: Negative, Slump test: Negative, Stork standing: not tested, and FABER test: Negative  FUNCTIONAL TESTS:  5 times sit to stand: test next visit Timed up and go (TUG): test next visit  GAIT: Distance walked: 50 Assistive device utilized: None Level of assistance: Complete Independence Comments: antalgic, guarded    TODAY'S TREATMENT  Date: 02/13/22 Discussed using back brace-pt would like to buy a soft brace that she can use with heat or ice.  PT agreed that this could be used intermittently for short periods as needed.  PT educated that she should not use for long periods to avoid core atrophy.  Discussed sleep positions with pt to  find comfortable and neutral positions.  Supine hamstring stretch and ITB stretch with strap 2x20 seconds bil Supine piriformis stretch 2x20 seconds  Seated piriformis and hamstring stretch  Pelvic tilt: 5" hold x 5- max verbal cueing for breathing and TA activation.  Suggested that pt do this without tilt-just TA activation and count out loud to maintain breathing Low trunk rotation 3x20 seconds  Open book x10 Sidelying clam x10 each 02/11/22: Initial eval completed and initiated HEP   PATIENT EDUCATION:  Education details: Initiated HEP Person educated: Patient Education method: Consulting civil engineer, Media planner, Verbal cues, and Handouts Education comprehension: verbalized understanding, returned demonstration, and verbal cues required   HOME EXERCISE  PROGRAM:  AAccess Code: [email or web address] URL: [email or web address] Date: 02/13/2022 Prepared by: Claiborne Billings  Exercises - Supine Hamstring Stretch with Strap  - 1 x daily - 7 x weekly - 1 sets - 3 reps - 30 sec hold - Supine ITB Stretch with Strap  - 1 x daily - 7 x weekly - 1 sets - 3 reps - 30 sec hold - Supine Piriformis Stretch Pulling Heel to Hip  - 1 x daily - 7 x weekly - 1 sets - 3 reps - 30 sec hold - Supine Posterior Pelvic Tilt  - 1 x daily - 7 x weekly - 1 sets - 20 reps - Standing Quad Stretch with Table and Chair Support  - 1 x daily - 7 x weekly - 1 sets - 3 reps - 30 sec hold - Seated Piriformis Stretch with Trunk Bend  - 1 x daily - 7 x weekly - 1 sets - 3 reps - 20 hold - Seated Hamstring Stretch  - 2 x daily - 7 x weekly - 1 sets - 3 reps - 20 hold - Sidelying Open Book Thoracic Lumbar Rotation and Extension  - 2 x daily - 7 x weekly - 1 sets - 10 reps - Clamshell  - 1 x daily - 7 x weekly - 2 sets - 10 reps  ASSESSMENT:  CLINICAL IMPRESSION: First time follow-up after evaluation.  PT addressed pt questions regarding brace use, sleep positioning and order of exercises.  Pt required tactile cues for TA activation and breathing with core exercises.  PT added additional positions for hamstring and gluteal stretches.  Patient will benefit from skilled PT to address the below impairments and improve overall function.    OBJECTIVE IMPAIRMENTS Abnormal gait, decreased mobility, difficulty walking, decreased ROM, decreased strength, increased fascial restrictions, increased muscle spasms, impaired flexibility, postural dysfunction, obesity, and pain.   ACTIVITY LIMITATIONS carrying, lifting, bending, sitting, standing, squatting, sleeping, stairs, transfers, bed mobility, and dressing  PARTICIPATION LIMITATIONS: meal prep, cleaning, laundry, driving, shopping, community activity, and yard work  PERSONAL FACTORS Behavior pattern, Fitness, Past/current experiences, Time since  onset of injury/illness/exacerbation, and 1-2 comorbidities: chronic pain, RA  are also affecting patient's functional outcome.   REHAB POTENTIAL: Good  CLINICAL DECISION MAKING: Evolving/moderate complexity  EVALUATION COMPLEXITY: Moderate   GOALS: Goals reviewed with patient? Yes  SHORT TERM GOALS: Target date: 03/11/2022  Pain report to be no greater than 4/10  Baseline: Goal status: INITIAL  2.  Patient will be independent with initial HEP  Baseline:  Goal status: INITIAL  3.  Patient to be able to stand or walk for at least 15 min without leg pain  Baseline:  Goal status: INITIAL   LONG TERM GOALS: Target date: 04/08/2022  Patient to report pain no greater than 2/10  Baseline:  Goal status: INITIAL  2.  Patient to be independent with advanced HEP  Baseline:  Goal status: INITIAL  3.  Patient to be able to stand or walk for at least 30 min without pain Baseline:  Goal status: INITIAL  4.  FOTO to be Baseline:  Goal status: INITIAL  5.  5 times sit to stand to improve by 2 seconds Baseline:  Goal status: INITIAL  6.  TUG test to improve by 2 seconds Baseline:  Goal status: INITIAL   PLAN: PT FREQUENCY: 2x/week  PT DURATION: 8 weeks  PLANNED INTERVENTIONS: Therapeutic exercises, Therapeutic activity, Neuromuscular re-education, Balance training, Gait training, Patient/Family education, Self Care, Joint mobilization, Stair training, DME instructions, Aquatic Therapy, Dry Needling, Electrical stimulation, Spinal mobilization, Cryotherapy, Moist heat, Taping, Traction, Ultrasound, Ionotophoresis '4mg'$ /ml Dexamethasone, Manual therapy, and Re-evaluation.  PLAN FOR NEXT SESSION: Review HEP, Nustep, begin core strengthening.     Sigurd Sos, PT 02/13/22 10:14 AM

## 2022-02-19 ENCOUNTER — Ambulatory Visit: Payer: BC Managed Care – PPO | Attending: Rheumatology

## 2022-02-19 DIAGNOSIS — M5459 Other low back pain: Secondary | ICD-10-CM | POA: Diagnosis not present

## 2022-02-19 DIAGNOSIS — R293 Abnormal posture: Secondary | ICD-10-CM | POA: Insufficient documentation

## 2022-02-19 DIAGNOSIS — R252 Cramp and spasm: Secondary | ICD-10-CM | POA: Diagnosis present

## 2022-02-19 DIAGNOSIS — R262 Difficulty in walking, not elsewhere classified: Secondary | ICD-10-CM | POA: Insufficient documentation

## 2022-02-19 DIAGNOSIS — M6281 Muscle weakness (generalized): Secondary | ICD-10-CM | POA: Insufficient documentation

## 2022-02-19 NOTE — Therapy (Signed)
OUTPATIENT PHYSICAL THERAPY TREATMENT   Patient Name: [name] MRN: [id number] DOB:08/01/1960, 61 y.o., female Today's Date: 02/19/2022   PT End of Session - 02/19/22 0939     Visit Number 3    Date for PT Re-Evaluation 04/08/22    Authorization Type [employer]    PT Start Time 0930    PT Stop Time 2671    PT Time Calculation (min) 45 min    Activity Tolerance Patient tolerated treatment well    Behavior During Therapy WFL for tasks assessed/performed              Past Medical History:  Diagnosis Date   Adjustment disorder with anxiety    B12 deficiency due to diet    is a vegetarian   Chest pain, unspecified    Chronic anticoagulation    Fatty liver    Per patient   GERD (gastroesophageal reflux disease)    Headache(784.0)    migraines and head pain   Hx of varicella    As Child   Iron deficiency anemia, unspecified    Otalgia, unspecified    Other and unspecified hyperlipidemia    Palpitations    Personal history of venous thrombosis and embolism    Recurrent UTI    Rheumatoid arthritis(714.0)    deveshewar   Unspecified vitamin D deficiency    UTI (lower urinary tract infection) 11/28/2011   citrobacter      Past Surgical History:  Procedure Laterality Date   ORIF ULNAR FRACTURE     TYMPANOSTOMY TUBE PLACEMENT  2009   Right   Patient Active Problem List   Diagnosis Date Noted   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[place]) 01/06/2018   Long term (current) use of anticoagulants 02/11/2017   Sicca syndrome ([place]) 12/22/2016   ANA positive 12/22/2016   Raised intraocular pressure of both eyes 12/22/2016   Migraine 03/31/2016   Anxiety 03/31/2016   High risk medication use 03/31/2016   Primary osteoarthritis of both feet 03/31/2016   Primary osteoarthritis of both knees 03/31/2016   Stiffness of left hand joint 11/27/2014   Elevated IOP 06/09/2014   Long term current use of systemic steroids 06/09/2014   Medically complex patient 06/09/2014    Current use of steroid medication 01/09/2014   Xerosis of skin 12/07/2013   Encounter for therapeutic drug monitoring 06/09/2013   B12 deficiency 05/31/2013   Iron deficiency 05/31/2013   Visit for preventive health examination 05/25/2012   Mammogram abnormal 04/12/2012   Iliac crest  pain 01/23/2012   Vaginal atrophy 01/23/2012   History of recurrent UTI (urinary tract infection) 01/23/2012   Osteoporosis 04/29/2011   Nonspecific abnormal results of liver function study 12/08/2010   Chronic anticoagulation    Anticoagulant long-term use 07/15/2010   Anticardiolipin antibody positive 07/15/2010   Chronic cough 03/19/2009   CHEST PAIN-UNSPECIFIED 08/26/2008   PALPITATIONS, RECURRENT 07/24/2008   Vitamin D deficiency 05/30/2008   HYPERLIPIDEMIA 05/30/2008   IRON DEFICIENCY 05/30/2008   Adjustment disorder with anxiety 05/30/2008   Rheumatoid arthritis ([place]) 05/30/2008   PULMONARY EMBOLISM, HX OF 05/30/2008    PCP: Burnis Medin, MD   REFERRING PROVIDER: Bo Merino, MD   REFERRING DIAG: M51.36 (ICD-10-CM) - DDD (degenerative disc disease), lumbar M54.50 (ICD-10-CM) - Acute midline low back pain without sciatica   Rationale for Evaluation and Treatment Rehabilitation  THERAPY DIAG:  Other low back pain  Abnormal posture  Cramp and spasm  Muscle weakness (generalized)  Difficulty in walking, not elsewhere classified  ONSET DATE: 01/30/2022   SUBJECTIVE:                                                                                                                                                                                           SUBJECTIVE STATEMENT: I'm not getting better.  I still hurt.      PERTINENT HISTORY:  Most recent MD note:  [name] is a 61 y.o. female with history of seronegative rheumatoid arthritis.  According the patient about 2 weeks ago she started having lower back pain which gradually got worse.  She did not want to go to  the emergency room as she was concerned about COVID-19 virus exposure.  She was seen by Dr.Xu on January 28, 2022.  She had x-rays of her lumbar spine which showed some degenerative changes.  She was offered a prednisone taper which she declined.  He also gave her prescription for Flexeril 5 mg which she has not started yet.  She states the Lidoderm patches were not covered by her insurance.  She continues to have lower back pain.  None of the other joints are painful or swollen.  She states that she had some discomfort in her left heel which comes and goes.  She has been taking methotrexate 8 tablets p.o. weekly and hydroxychloroquine 1 tablet p.o. twice daily Monday to Friday  PAIN:  Are you having pain? Yes: NPRS scale: 2/10 Pain location: low back Pain description: aching Aggravating factors: standing, sitting Relieving factors: heat prednisone dose pack   PRECAUTIONS: None  WEIGHT BEARING RESTRICTIONS No  FALLS:  Has patient fallen in last 6 months? No  LIVING ENVIRONMENT: Lives with: lives with their spouse Lives in: House/apartment Stairs: Yes: Internal: 12 steps; can reach both Has following equipment at home: None  OCCUPATION: Retired  PLOF: Independent, Independent with basic ADLs, Independent with household mobility without device, Independent with community mobility without device, Independent with homemaking with ambulation, Independent with gait, and Independent with transfers  PATIENT GOALS "I just want to be pain free"   OBJECTIVE:   DIAGNOSTIC FINDINGS:  Xray 01/28/22 Mild degenerative changes L5-S1.  Mild facet disease  PATIENT SURVEYS:  FOTO 57 goal is 81  SCREENING FOR RED FLAGS: Bowel or bladder incontinence: No Spinal tumors: No Cauda equina syndrome: No Compression fracture: No Abdominal aneurysm: No  COGNITION:  Overall cognitive status: Within functional limits for tasks assessed     SENSATION: WFL  MUSCLE LENGTH: Hamstrings: Right 45  deg; Left 50 deg Thomas test: Right pos ; Left pos   POSTURE: increased lumbar lordosis   LUMBAR ROM:   Active  A/PROM  eval  Flexion WNL  Extension WNL  Right lateral flexion Fingertips to distal thigh  Left lateral flexion Fingertips to joint line  Right rotation WNL  Left rotation WNL   (Blank rows = not tested)  LOWER EXTREMITY ROM:     WFL  LOWER EXTREMITY MMT:    Generally 4-/5 bilateral LE's  LUMBAR SPECIAL TESTS:  Straight leg raise test: Negative, Slump test: Negative, Stork standing: not tested, and FABER test: Negative  FUNCTIONAL TESTS:  5 times sit to stand: test next visit Timed up and go (TUG): test next visit  GAIT: Distance walked: 50 Assistive device utilized: None Level of assistance: Complete Independence Comments: antalgic, guarded    TODAY'S TREATMENT  Date: 02/19/22 Discussed back brace once again, patient had questions and wanted PT to look at back braces with her.  Reminded patient that a back brace should only be worn intermittently to avoid core atrophy.  We looked at pictures of braces she found and gave suggestion on most appropriate brace.  Encouraged good walking schedule (patient seems to do a good bit of walking- states she walks for about an hour 3 times per week- suggested maybe reduce time to 30-45 min on level ground daily) Nustep x 6 min level 1 (PT present to discuss progress and questions) Prone quad/hip flexor stretch with strap 3 x 30 sec each LE Prone on elbows x 1 min Supine hamstring stretch and ITB stretch with strap 2x20 seconds bil Supine piriformis stretch 2x20 seconds  Seated piriformis and hamstring stretch  Pelvic tilt: with thumbs on ASIS and practicing TA activation while counting Open book x10  Date: 02/13/22 Discussed using back brace-pt would like to buy a soft brace that she can use with heat or ice.  PT agreed that this could be used intermittently for short periods as needed.  PT educated that she should  not use for long periods to avoid core atrophy.  Discussed sleep positions with pt to find comfortable and neutral positions.  Supine hamstring stretch and ITB stretch with strap 2x20 seconds bil Supine piriformis stretch 2x20 seconds  Seated piriformis and hamstring stretch  Pelvic tilt: 5" hold x 5- max verbal cueing for breathing and TA activation.  Suggested that pt do this without tilt-just TA activation and count out loud to maintain breathing Low trunk rotation 3x20 seconds  Open book x10 Sidelying clam x10 each 02/11/22: Initial eval completed and initiated HEP   PATIENT EDUCATION:  Education details: Initiated HEP Person educated: Patient Education method: Consulting civil engineer, Media planner, Verbal cues, and Handouts Education comprehension: verbalized understanding, returned demonstration, and verbal cues required   HOME EXERCISE PROGRAM:  Access Code: [email or web address] URL: [email or web address] Date: 02/19/2022 Prepared by: Candyce Churn  Exercises - Supine Hamstring Stretch with Strap  - 1 x daily - 7 x weekly - 1 sets - 3 reps - 30 sec hold - Supine ITB Stretch with Strap  - 1 x daily - 7 x weekly - 1 sets - 3 reps - 30 sec hold - Supine Piriformis Stretch Pulling Heel to Hip  - 1 x daily - 7 x weekly - 1 sets - 3 reps - 30 sec hold - Supine Posterior Pelvic Tilt  - 1 x daily - 7 x weekly - 1 sets - 20 reps - Standing Quad Stretch with Table and Chair Support  - 1 x daily - 7 x weekly - 1 sets - 3 reps - 30 sec hold - Seated Piriformis Stretch with Trunk Bend  -  1 x daily - 7 x weekly - 1 sets - 3 reps - 20 hold - Seated Hamstring Stretch  - 2 x daily - 7 x weekly - 1 sets - 3 reps - 20 hold - Sidelying Open Book Thoracic Lumbar Rotation and Extension  - 2 x daily - 7 x weekly - 1 sets - 10 reps - Clamshell  - 1 x daily - 7 x weekly - 2 sets - 10 reps - Prone Quadriceps Stretch with Strap  - 1 x daily - 7 x weekly - 1 sets - 3 reps - 30 sec holdAAccess Code:  [email or web address] URL: [email or web address] Date: 02/13/2022 Prepared by: Claiborne Billings  Exercises - Supine Hamstring Stretch with Strap  - 1 x daily - 7 x weekly - 1 sets - 3 reps - 30 sec hold - Supine ITB Stretch with Strap  - 1 x daily - 7 x weekly - 1 sets - 3 reps - 30 sec hold - Supine Piriformis Stretch Pulling Heel to Hip  - 1 x daily - 7 x weekly - 1 sets - 3 reps - 30 sec hold - Supine Posterior Pelvic Tilt  - 1 x daily - 7 x weekly - 1 sets - 20 reps - Standing Quad Stretch with Table and Chair Support  - 1 x daily - 7 x weekly - 1 sets - 3 reps - 30 sec hold - Seated Piriformis Stretch with Trunk Bend  - 1 x daily - 7 x weekly - 1 sets - 3 reps - 20 hold - Seated Hamstring Stretch  - 2 x daily - 7 x weekly - 1 sets - 3 reps - 20 hold - Sidelying Open Book Thoracic Lumbar Rotation and Extension  - 2 x daily - 7 x weekly - 1 sets - 10 reps - Clamshell  - 1 x daily - 7 x weekly - 2 sets - 10 reps  ASSESSMENT:  CLINICAL IMPRESSION: Patient with need for re-assurance and reminders regarding technique.  She had difficulty finding TA contraction but responded well to thumbs to ASIS.  She should continue to improve.  Patient will benefit from skilled PT to address the below impairments and improve overall function.    OBJECTIVE IMPAIRMENTS Abnormal gait, decreased mobility, difficulty walking, decreased ROM, decreased strength, increased fascial restrictions, increased muscle spasms, impaired flexibility, postural dysfunction, obesity, and pain.   ACTIVITY LIMITATIONS carrying, lifting, bending, sitting, standing, squatting, sleeping, stairs, transfers, bed mobility, and dressing  PARTICIPATION LIMITATIONS: meal prep, cleaning, laundry, driving, shopping, community activity, and yard work  PERSONAL FACTORS Behavior pattern, Fitness, Past/current experiences, Time since onset of injury/illness/exacerbation, and 1-2 comorbidities: chronic pain, RA  are also affecting patient's functional  outcome.   REHAB POTENTIAL: Good  CLINICAL DECISION MAKING: Evolving/moderate complexity  EVALUATION COMPLEXITY: Moderate   GOALS: Goals reviewed with patient? Yes  SHORT TERM GOALS: Target date: 03/11/2022  Pain report to be no greater than 4/10  Baseline: Goal status: INITIAL  2.  Patient will be independent with initial HEP  Baseline:  Goal status: INITIAL  3.  Patient to be able to stand or walk for at least 15 min without leg pain  Baseline:  Goal status: INITIAL   LONG TERM GOALS: Target date: 04/08/2022  Patient to report pain no greater than 2/10  Baseline:  Goal status: INITIAL  2.  Patient to be independent with advanced HEP  Baseline:  Goal status: INITIAL  3.  Patient to be able to stand or walk  for at least 30 min without pain Baseline:  Goal status: INITIAL  4.  FOTO to be Baseline:  Goal status: INITIAL  5.  5 times sit to stand to improve by 2 seconds Baseline:  Goal status: INITIAL  6.  TUG test to improve by 2 seconds Baseline:  Goal status: INITIAL   PLAN: PT FREQUENCY: 2x/week  PT DURATION: 8 weeks  PLANNED INTERVENTIONS: Therapeutic exercises, Therapeutic activity, Neuromuscular re-education, Balance training, Gait training, Patient/Family education, Self Care, Joint mobilization, Stair training, DME instructions, Aquatic Therapy, Dry Needling, Electrical stimulation, Spinal mobilization, Cryotherapy, Moist heat, Taping, Traction, Ultrasound, Ionotophoresis '4mg'$ /ml Dexamethasone, Manual therapy, and Re-evaluation.  PLAN FOR NEXT SESSION: Review HEP, Nustep, begin core strengthening.     Anderson Malta B. Apoorva Bugay, PT 02/19/22 10:23 AM  [facility] [address], [place], [place] [address] Phone # [phone] Fax [phone]

## 2022-02-21 ENCOUNTER — Encounter: Payer: Self-pay | Admitting: Rehabilitative and Restorative Service Providers"

## 2022-02-21 ENCOUNTER — Ambulatory Visit: Payer: BC Managed Care – PPO | Admitting: Rehabilitative and Restorative Service Providers"

## 2022-02-21 DIAGNOSIS — R262 Difficulty in walking, not elsewhere classified: Secondary | ICD-10-CM

## 2022-02-21 DIAGNOSIS — R252 Cramp and spasm: Secondary | ICD-10-CM

## 2022-02-21 DIAGNOSIS — M5459 Other low back pain: Secondary | ICD-10-CM | POA: Diagnosis not present

## 2022-02-21 DIAGNOSIS — M6281 Muscle weakness (generalized): Secondary | ICD-10-CM

## 2022-02-21 DIAGNOSIS — R293 Abnormal posture: Secondary | ICD-10-CM

## 2022-02-21 NOTE — Therapy (Signed)
OUTPATIENT PHYSICAL THERAPY TREATMENT NOTE   Patient Name: [name] MRN: [id number] DOB:Oct 02, 1960, 61 y.o., female Today's Date: 02/21/2022   PT End of Session - 02/21/22 0935     Visit Number 4    Date for PT Re-Evaluation 04/08/22    Authorization Type BCBS    PT Start Time 0932    PT Stop Time 1010    PT Time Calculation (min) 38 min    Activity Tolerance Patient tolerated treatment well    Behavior During Therapy WFL for tasks assessed/performed              Past Medical History:  Diagnosis Date   Adjustment disorder with anxiety    B12 deficiency due to diet    is a vegetarian   Chest pain, unspecified    Chronic anticoagulation    Fatty liver    Per patient   GERD (gastroesophageal reflux disease)    Headache(784.0)    migraines and head pain   Hx of varicella    As Child   Iron deficiency anemia, unspecified    Otalgia, unspecified    Other and unspecified hyperlipidemia    Palpitations    Personal history of venous thrombosis and embolism    Recurrent UTI    Rheumatoid arthritis(714.0)    deveshewar   Unspecified vitamin D deficiency    UTI (lower urinary tract infection) 11/28/2011   citrobacter      Past Surgical History:  Procedure Laterality Date   ORIF ULNAR FRACTURE     TYMPANOSTOMY TUBE PLACEMENT  2009   Right   Patient Active Problem List   Diagnosis Date Noted   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[place]) 01/06/2018   Long term (current) use of anticoagulants 02/11/2017   Sicca syndrome ([place]) 12/22/2016   ANA positive 12/22/2016   Raised intraocular pressure of both eyes 12/22/2016   Migraine 03/31/2016   Anxiety 03/31/2016   High risk medication use 03/31/2016   Primary osteoarthritis of both feet 03/31/2016   Primary osteoarthritis of both knees 03/31/2016   Stiffness of left hand joint 11/27/2014   Elevated IOP 06/09/2014   Long term current use of systemic steroids 06/09/2014   Medically complex patient 06/09/2014    Current use of steroid medication 01/09/2014   Xerosis of skin 12/07/2013   Encounter for therapeutic drug monitoring 06/09/2013   B12 deficiency 05/31/2013   Iron deficiency 05/31/2013   Visit for preventive health examination 05/25/2012   Mammogram abnormal 04/12/2012   Iliac crest  pain 01/23/2012   Vaginal atrophy 01/23/2012   History of recurrent UTI (urinary tract infection) 01/23/2012   Osteoporosis 04/29/2011   Nonspecific abnormal results of liver function study 12/08/2010   Chronic anticoagulation    Anticoagulant long-term use 07/15/2010   Anticardiolipin antibody positive 07/15/2010   Chronic cough 03/19/2009   CHEST PAIN-UNSPECIFIED 08/26/2008   PALPITATIONS, RECURRENT 07/24/2008   Vitamin D deficiency 05/30/2008   HYPERLIPIDEMIA 05/30/2008   IRON DEFICIENCY 05/30/2008   Adjustment disorder with anxiety 05/30/2008   Rheumatoid arthritis ([place]) 05/30/2008   PULMONARY EMBOLISM, HX OF 05/30/2008    PCP: Burnis Medin, MD   REFERRING PROVIDER: Bo Merino, MD   REFERRING DIAG: M51.36 (ICD-10-CM) - DDD (degenerative disc disease), lumbar M54.50 (ICD-10-CM) - Acute midline low back pain without sciatica   Rationale for Evaluation and Treatment Rehabilitation  THERAPY DIAG:  Other low back pain  Abnormal posture  Cramp and spasm  Muscle weakness (generalized)  Difficulty in walking, not elsewhere classified  ONSET DATE: 01/30/2022   SUBJECTIVE:                                                                                                                                                                                           SUBJECTIVE STATEMENT: Pt reports that she does not have as much pain in the morning, but it progresses as the day goes on.    PERTINENT HISTORY:  Most recent MD note:  [name] is a 61 y.o. female with history of seronegative rheumatoid arthritis.  According the patient about 2 weeks ago she started having lower  back pain which gradually got worse.  She did not want to go to the emergency room as she was concerned about COVID-19 virus exposure.  She was seen by Dr.Xu on January 28, 2022.  She had x-rays of her lumbar spine which showed some degenerative changes.  She was offered a prednisone taper which she declined.  He also gave her prescription for Flexeril 5 mg which she has not started yet.  She states the Lidoderm patches were not covered by her insurance.  She continues to have lower back pain.  None of the other joints are painful or swollen.  She states that she had some discomfort in her left heel which comes and goes.  She has been taking methotrexate 8 tablets p.o. weekly and hydroxychloroquine 1 tablet p.o. twice daily Monday to Friday  PAIN:  Are you having pain? Yes: NPRS scale: 2/10 Pain location: low back Pain description: aching Aggravating factors: standing, sitting Relieving factors: heat prednisone dose pack   PRECAUTIONS: None  WEIGHT BEARING RESTRICTIONS No  FALLS:  Has patient fallen in last 6 months? No  LIVING ENVIRONMENT: Lives with: lives with their spouse Lives in: House/apartment Stairs: Yes: Internal: 12 steps; can reach both Has following equipment at home: None  OCCUPATION: Retired  PLOF: Independent, Independent with basic ADLs, Independent with household mobility without device, Independent with community mobility without device, Independent with homemaking with ambulation, Independent with gait, and Independent with transfers  PATIENT GOALS "I just want to be pain free"   OBJECTIVE:   DIAGNOSTIC FINDINGS:  Xray 01/28/22 Mild degenerative changes L5-S1.  Mild facet disease  PATIENT SURVEYS:  FOTO 57 goal is 55  SCREENING FOR RED FLAGS: Bowel or bladder incontinence: No Spinal tumors: No Cauda equina syndrome: No Compression fracture: No Abdominal aneurysm: No  COGNITION:  Overall cognitive status: Within functional limits for tasks  assessed     SENSATION: WFL  MUSCLE LENGTH: Hamstrings: Right 45 deg; Left 50 deg Thomas test: Right pos ; Left pos   POSTURE: increased  lumbar lordosis   LUMBAR ROM:   Active  A/PROM  eval  Flexion WNL  Extension WNL  Right lateral flexion Fingertips to distal thigh  Left lateral flexion Fingertips to joint line  Right rotation WNL  Left rotation WNL   (Blank rows = not tested)  LOWER EXTREMITY ROM:     WFL  LOWER EXTREMITY MMT:    Generally 4-/5 bilateral LE's  LUMBAR SPECIAL TESTS:  Straight leg raise test: Negative, Slump test: Negative, Stork standing: not tested, and FABER test: Negative  FUNCTIONAL TESTS:   Eval: 5 times sit to stand: test next visit Timed up and go (TUG): test next visit  02/21/2022: 5 times sit to stand: 14.6 sec with minimal UE pushing up from thighs Timed up and go (TUG):  9.5 sec without assistive device 3 min walk test:  730 ft without assistive device  GAIT: Distance walked: 50 Assistive device utilized: None Level of assistance: Complete Independence Comments: antalgic, guarded    TODAY'S TREATMENT  Date: 02/21/22 Nustep level 3 x6 min with PT present to discuss status Objective testing:  5 times sit to/from stand, TUG, 3 min walk (see above) Seated 3 way green pball rollout 5x10 sec each Seated piriformis stretch 2x20 sec bilat 4 way pelvic tilt seated on dynadisc Education on use of a towel roll for lumbar support when sitting in a chair, pt reports decreased pain with use. Education and demonstration of use of a lacrosse or tennis ball for manual trigger point release of piriformis in sitting vs standing.  Pt able to return demonstration. Manual therapy using Addaday to lumbar multifidi, piriformis, hamstrings.  Pt verbalizes decreased decreased pain following.   Date: 02/19/22 Discussed back brace once again, patient had questions and wanted PT to look at back braces with her.  Reminded patient that a back brace  should only be worn intermittently to avoid core atrophy.  We looked at pictures of braces she found and gave suggestion on most appropriate brace.  Encouraged good walking schedule (patient seems to do a good bit of walking- states she walks for about an hour 3 times per week- suggested maybe reduce time to 30-45 min on level ground daily) Nustep x 6 min level 1 (PT present to discuss progress and questions) Prone quad/hip flexor stretch with strap 3 x 30 sec each LE Prone on elbows x 1 min Supine hamstring stretch and ITB stretch with strap 2x20 seconds bil Supine piriformis stretch 2x20 seconds  Seated piriformis and hamstring stretch  Pelvic tilt: with thumbs on ASIS and practicing TA activation while counting Open book x10  Date: 02/13/22 Discussed using back brace-pt would like to buy a soft brace that she can use with heat or ice.  PT agreed that this could be used intermittently for short periods as needed.  PT educated that she should not use for long periods to avoid core atrophy.  Discussed sleep positions with pt to find comfortable and neutral positions.  Supine hamstring stretch and ITB stretch with strap 2x20 seconds bil Supine piriformis stretch 2x20 seconds  Seated piriformis and hamstring stretch  Pelvic tilt: 5" hold x 5- max verbal cueing for breathing and TA activation.  Suggested that pt do this without tilt-just TA activation and count out loud to maintain breathing Low trunk rotation 3x20 seconds  Open book x10 Sidelying clam x10 each 02/11/22: Initial eval completed and initiated HEP   PATIENT EDUCATION:  Education details: Initiated HEP Person educated: Patient Education method: Explanation,  Demonstration, Verbal cues, and Handouts Education comprehension: verbalized understanding, returned demonstration, and verbal cues required   HOME EXERCISE PROGRAM:  Access Code: [email or web address] URL: [email or web address] Date: 02/19/2022 Prepared by: Candyce Churn  Exercises - Supine Hamstring Stretch with Strap  - 1 x daily - 7 x weekly - 1 sets - 3 reps - 30 sec hold - Supine ITB Stretch with Strap  - 1 x daily - 7 x weekly - 1 sets - 3 reps - 30 sec hold - Supine Piriformis Stretch Pulling Heel to Hip  - 1 x daily - 7 x weekly - 1 sets - 3 reps - 30 sec hold - Supine Posterior Pelvic Tilt  - 1 x daily - 7 x weekly - 1 sets - 20 reps - Standing Quad Stretch with Table and Chair Support  - 1 x daily - 7 x weekly - 1 sets - 3 reps - 30 sec hold - Seated Piriformis Stretch with Trunk Bend  - 1 x daily - 7 x weekly - 1 sets - 3 reps - 20 hold - Seated Hamstring Stretch  - 2 x daily - 7 x weekly - 1 sets - 3 reps - 20 hold - Sidelying Open Book Thoracic Lumbar Rotation and Extension  - 2 x daily - 7 x weekly - 1 sets - 10 reps - Clamshell  - 1 x daily - 7 x weekly - 2 sets - 10 reps - Prone Quadriceps Stretch with Strap  - 1 x daily - 7 x weekly - 1 sets - 3 reps - 30 sec hold   ASSESSMENT:  CLINICAL IMPRESSION: Patient presents to skilled PT reporting continued pain that increases as day progresses with pain in piriformis region.  Focus of session on stretching and pelvic mobility.  Pt educated on how to perform self trigger point release using tennis ball.  Objective testing missed at initial evaluation performed during session for baseline measurements.  Pt did report a relief in pain following manual therapy.  Pt continues to require skilled PT to progress with goal related activities.   OBJECTIVE IMPAIRMENTS Abnormal gait, decreased mobility, difficulty walking, decreased ROM, decreased strength, increased fascial restrictions, increased muscle spasms, impaired flexibility, postural dysfunction, obesity, and pain.   ACTIVITY LIMITATIONS carrying, lifting, bending, sitting, standing, squatting, sleeping, stairs, transfers, bed mobility, and dressing  PARTICIPATION LIMITATIONS: meal prep, cleaning, laundry, driving, shopping, community  activity, and yard work  PERSONAL FACTORS Behavior pattern, Fitness, Past/current experiences, Time since onset of injury/illness/exacerbation, and 1-2 comorbidities: chronic pain, RA  are also affecting patient's functional outcome.   REHAB POTENTIAL: Good  CLINICAL DECISION MAKING: Evolving/moderate complexity  EVALUATION COMPLEXITY: Moderate   GOALS: Goals reviewed with patient? Yes  SHORT TERM GOALS: Target date: 03/11/2022  Pain report to be no greater than 4/10  Baseline: Goal status: IN PROGRESS  2.  Patient will be independent with initial HEP  Baseline:  Goal status: MET  3.  Patient to be able to stand or walk for at least 15 min without leg pain  Baseline:  Goal status: INITIAL   LONG TERM GOALS: Target date: 04/08/2022  Patient to report pain no greater than 2/10  Baseline:  Goal status: INITIAL  2.  Patient to be independent with advanced HEP  Baseline:  Goal status: INITIAL  3.  Patient to be able to stand or walk for at least 30 min without pain Baseline:  Goal status: INITIAL  4.  FOTO to be 71%. Baseline: 57% Goal  status: INITIAL  5.  5 times sit to stand to improve by 2 seconds Baseline: 14.6 sec on 02/21/22 Goal status: INITIAL  6.  TUG test to improve by 2 seconds Baseline:  9.5 sec on 02/21/22 Goal status: INITIAL   PLAN: PT FREQUENCY: 2x/week  PT DURATION: 8 weeks  PLANNED INTERVENTIONS: Therapeutic exercises, Therapeutic activity, Neuromuscular re-education, Balance training, Gait training, Patient/Family education, Self Care, Joint mobilization, Stair training, DME instructions, Aquatic Therapy, Dry Needling, Electrical stimulation, Spinal mobilization, Cryotherapy, Moist heat, Taping, Traction, Ultrasound, Ionotophoresis 61m/ml Dexamethasone, Manual therapy, and Re-evaluation.  PLAN FOR NEXT SESSION: Review HEP, Nustep, begin core strengthening.     SJuel Burrow PT 02/21/22 11:14 AM   [facility]  [facility] [address] SHarker HeightsGPalouse [place] [id number] # [phone] [phone]

## 2022-02-25 NOTE — Progress Notes (Signed)
No chief complaint on file.   HPI: [name] 61 y.o. come in for  ROS: See pertinent positives and negatives per HPI.  Past Medical History:  Diagnosis Date   Adjustment disorder with anxiety    B12 deficiency due to diet    is a vegetarian   Chest pain, unspecified    Chronic anticoagulation    Fatty liver    Per patient   GERD (gastroesophageal reflux disease)    Headache(784.0)    migraines and head pain   Hx of varicella    As Child   Iron deficiency anemia, unspecified    Otalgia, unspecified    Other and unspecified hyperlipidemia    Palpitations    Personal history of venous thrombosis and embolism    Recurrent UTI    Rheumatoid arthritis(714.0)    deveshewar   Unspecified vitamin D deficiency    UTI (lower urinary tract infection) 11/28/2011   citrobacter       Family History  Problem Relation Age of Onset   Parkinsonism Mother    Dementia Father    Stroke Father    Hyperlipidemia Father    Diabetes Father    Lymphoma Other    Colon cancer Neg Hx    Colon polyps Neg Hx    Esophageal cancer Neg Hx    Stomach cancer Neg Hx    Rectal cancer Neg Hx    Breast cancer Neg Hx     Social History   Socioeconomic History   Marital status: Married    Spouse name: Not on file   Number of children: 1   Years of education: Not on file   Highest education level: Bachelor's degree (e.g., BA, AB, BS)  Occupational History   Occupation: home maker  Tobacco Use   Smoking status: Never    Passive exposure: Past   Smokeless tobacco: Never  Vaping Use   Vaping Use: Never used  Substance and Sexual Activity   Alcohol use: No   Drug use: Never   Sexual activity: Not on file  Other Topics Concern   Not on file  Social History Narrative   Regular Exercise-no   20 yrs in [place]   Son at [employer]   From [place].   Married HH  of 3    G1P1            Social Determinants of Health   Financial Resource Strain: Low Risk  (07/16/2021)   Overall  Financial Resource Strain (CARDIA)    Difficulty of Paying Living Expenses: Not hard at all  Food Insecurity: No Food Insecurity (07/16/2021)   Hunger Vital Sign    Worried About Running Out of Food in the Last Year: Never true    Ran Out of Food in the Last Year: Never true  Transportation Needs: No Transportation Needs (07/16/2021)   PRAPARE - Hydrologist (Medical): No    Lack of Transportation (Non-Medical): No  Physical Activity: Insufficiently Active (07/16/2021)   Exercise Vital Sign    Days of Exercise per Week: 2 days    Minutes of Exercise per Session: 50 min  Stress: Stress Concern Present (07/16/2021)   [place]    Feeling of Stress : To some extent  Social Connections: Unknown (07/16/2021)   Social Connection and Isolation Panel [NHANES]    Frequency of Communication with Friends and Family: More than three times a week    Frequency  of Social Gatherings with Friends and Family: Patient refused    Attends Religious Services: Patient refused    Marine scientist or Organizations: No    Attends Music therapist: Not on file    Marital Status: Married    Outpatient Medications Prior to Visit  Medication Sig Dispense Refill   alendronate (FOSAMAX) 70 MG tablet TAKE 1 TABLET(70 MG) BY MOUTH 1 TIME A WEEK WITH A FULL GLASS OF WATER AND ON AN EMPTY STOMACH 12 tablet 0   ALPRAZolam (XANAX) 0.25 MG tablet Take 1 tablet (0.25 mg total) by mouth 2 (two) times daily as needed for anxiety. 20 tablet 0   Calcium Carb-Cholecalciferol 600-800 MG-UNIT TABS Take by mouth.     Cholecalciferol (VITAMIN D3) 2000 UNITS TABS Take 1 tablet by mouth daily.     Cranberry 1000 MG CAPS Take by mouth.     Cyanocobalamin (VITAMIN B-12 PO) Take by mouth once a week.     cyclobenzaprine (FLEXERIL) 5 MG tablet Take 0.5-1 tablets (2.5-5 mg total) by mouth at bedtime as needed for muscle spasms.  20 tablet 3   cycloSPORINE (RESTASIS) 0.05 % ophthalmic emulsion Place 1 drop into both eyes daily.     Eflornithine HCl 13.9 % cream Apply topically daily.      folic acid (FOLVITE) 1 MG tablet TAKE 2 TABLETS(2 MG) BY MOUTH DAILY 180 tablet 3   hydroxychloroquine (PLAQUENIL) 200 MG tablet TAKE 1 TABLET(200 MG) BY MOUTH DAILY 90 tablet 0   lidocaine (LIDODERM) 5 % Place 1 patch onto the skin daily. Remove & Discard patch within 12 hours or as directed by MD 30 patch 0   methotrexate (RHEUMATREX) 2.5 MG tablet TAKE 8 TABLETS BY MOUTH ONCE WEEKLY 96 tablet 0   mineral/vitamin supplement (MULTIGEN) 70 MG TABS tablet TAKE 1 TABLET EVERY DAY 90 tablet 1   omeprazole (PRILOSEC) 20 MG capsule Take 1 capsule (20 mg total) by mouth as needed. 30 capsule 3   predniSONE (DELTASONE) 5 MG tablet Take 4 tabs po qd x 2 days, 3  tabs po qd x 2 days, 2  tabs po qd x 2 days, 1  tab po qd x 2 days 20 tablet 0   SUMAtriptan (IMITREX) 50 MG tablet TAKE ONE TABLET BY MOUTH AS NEEDED. MAY REPEAT IN 2 TO 4 HOURS IF NEEDED. 9 tablet 1   warfarin (COUMADIN) 2.5 MG tablet TAKE 2 TABLETS BY MOUTH DAILY, EXCEPT TAKE  1 TABLET ON SUNDAYS AND TAKE 1 1/2 TABLETS ON THURSDAYS OR AS DIRECTED BY ANTICOAGULATION CLINIC 180 tablet 1   No facility-administered medications prior to visit.     EXAM:  There were no vitals taken for this visit.  There is no height or weight on file to calculate BMI.  GENERAL: vitals reviewed and listed above, alert, oriented, appears well hydrated and in no acute distress HEENT: atraumatic, conjunctiva  clear, no obvious abnormalities on inspection of external nose and ears OP : no lesion edema or exudate  NECK: no obvious masses on inspection palpation  LUNGS: clear to auscultation bilaterally, no wheezes, rales or rhonchi, good air movement CV: HRRR, no clubbing cyanosis or  peripheral edema nl cap refill  MS: moves all extremities without noticeable focal  abnormality PSYCH: pleasant and  cooperative, no obvious depression or anxiety Lab Results  Component Value Date   WBC 6.9 01/30/2022   HGB 15.0 01/30/2022   HCT 45.6 (H) 01/30/2022   PLT 259 01/30/2022  GLUCOSE 91 01/30/2022   CHOL 192 06/19/2021   TRIG 144 06/19/2021   HDL 67 06/19/2021   LDLDIRECT 97.3 05/25/2013   LDLCALC 101 (H) 06/19/2021   ALT 16 01/30/2022   AST 21 01/30/2022   NA 140 01/30/2022   K 4.5 01/30/2022   CL 104 01/30/2022   CREATININE 0.90 01/30/2022   BUN 10 01/30/2022   CO2 28 01/30/2022   TSH 3.21 09/13/2019   INR 2.0 02/05/2022   HGBA1C 6.0 07/20/2018   BP Readings from Last 3 Encounters:  01/30/22 115/75  09/26/21 112/71  07/19/21 114/60    ASSESSMENT AND PLAN:  Discussed the following assessment and plan:  No diagnosis found.  -Patient advised to return or notify health care team  if  new concerns arise.  There are no Patient Instructions on file for this visit.   Standley Brooking. Caragh Gasper M.D.

## 2022-02-26 ENCOUNTER — Ambulatory Visit: Payer: BC Managed Care – PPO | Admitting: Psychology

## 2022-02-26 ENCOUNTER — Ambulatory Visit: Payer: BC Managed Care – PPO

## 2022-02-26 ENCOUNTER — Ambulatory Visit (INDEPENDENT_AMBULATORY_CARE_PROVIDER_SITE_OTHER): Payer: BC Managed Care – PPO

## 2022-02-26 ENCOUNTER — Encounter: Payer: Self-pay | Admitting: Internal Medicine

## 2022-02-26 ENCOUNTER — Ambulatory Visit (INDEPENDENT_AMBULATORY_CARE_PROVIDER_SITE_OTHER): Payer: BC Managed Care – PPO | Admitting: Internal Medicine

## 2022-02-26 ENCOUNTER — Other Ambulatory Visit: Payer: Self-pay | Admitting: Internal Medicine

## 2022-02-26 VITALS — BP 110/80 | HR 66 | Temp 97.5°F | Wt 157.2 lb

## 2022-02-26 DIAGNOSIS — M5459 Other low back pain: Secondary | ICD-10-CM

## 2022-02-26 DIAGNOSIS — E2839 Other primary ovarian failure: Secondary | ICD-10-CM | POA: Diagnosis not present

## 2022-02-26 DIAGNOSIS — Z23 Encounter for immunization: Secondary | ICD-10-CM

## 2022-02-26 DIAGNOSIS — R252 Cramp and spasm: Secondary | ICD-10-CM

## 2022-02-26 DIAGNOSIS — Z87440 Personal history of urinary (tract) infections: Secondary | ICD-10-CM

## 2022-02-26 DIAGNOSIS — M549 Dorsalgia, unspecified: Secondary | ICD-10-CM

## 2022-02-26 DIAGNOSIS — R293 Abnormal posture: Secondary | ICD-10-CM

## 2022-02-26 DIAGNOSIS — K130 Diseases of lips: Secondary | ICD-10-CM

## 2022-02-26 DIAGNOSIS — M6281 Muscle weakness (generalized): Secondary | ICD-10-CM

## 2022-02-26 DIAGNOSIS — Z7901 Long term (current) use of anticoagulants: Secondary | ICD-10-CM | POA: Diagnosis not present

## 2022-02-26 DIAGNOSIS — Z79899 Other long term (current) drug therapy: Secondary | ICD-10-CM

## 2022-02-26 DIAGNOSIS — R262 Difficulty in walking, not elsewhere classified: Secondary | ICD-10-CM

## 2022-02-26 LAB — POCT INR: INR: 1.5 — AB (ref 2.0–3.0)

## 2022-02-26 NOTE — Progress Notes (Signed)
Continue  5 mg (2 tablets) daily except take 2.5 mg (1 tablet) on Sundays and take 3.75 mg (1 1/2 tablet) on Thursdays. Recheck in 4 weeks.  

## 2022-02-26 NOTE — Patient Instructions (Signed)
Continue  with physical therapy as planned  and can see  back specialist appt.    Muscle relaxants can be take at night and as needed   will not change  the course of the  problem .  Perleche can try   topical  anti yeast  locally and  topical antibiotic .   You can bring in urine specimen  in am   in sterile cup to be sure no uti.

## 2022-02-26 NOTE — Progress Notes (Signed)
Order for poct ua

## 2022-02-26 NOTE — Therapy (Signed)
OUTPATIENT PHYSICAL THERAPY TREATMENT NOTE   Patient Name: [name] MRN: [id number] DOB:05/19/1961, 61 y.o., female Today's Date: 02/26/2022   PT End of Session - 02/26/22 0932     Visit Number 5    Date for PT Re-Evaluation 04/08/22    Authorization Type [employer]    PT Start Time 0932    PT Stop Time 1013    PT Time Calculation (min) 41 min    Activity Tolerance Patient tolerated treatment well    Behavior During Therapy WFL for tasks assessed/performed              Past Medical History:  Diagnosis Date   Adjustment disorder with anxiety    B12 deficiency due to diet    is a vegetarian   Chest pain, unspecified    Chronic anticoagulation    Fatty liver    Per patient   GERD (gastroesophageal reflux disease)    Headache(784.0)    migraines and head pain   Hx of varicella    As Child   Iron deficiency anemia, unspecified    Otalgia, unspecified    Other and unspecified hyperlipidemia    Palpitations    Personal history of venous thrombosis and embolism    Recurrent UTI    Rheumatoid arthritis(714.0)    deveshewar   Unspecified vitamin D deficiency    UTI (lower urinary tract infection) 11/28/2011   citrobacter      Past Surgical History:  Procedure Laterality Date   ORIF ULNAR FRACTURE     TYMPANOSTOMY TUBE PLACEMENT  2009   Right   Patient Active Problem List   Diagnosis Date Noted   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[place]) 01/06/2018   Long term (current) use of anticoagulants 02/11/2017   Sicca syndrome ([place]) 12/22/2016   ANA positive 12/22/2016   Raised intraocular pressure of both eyes 12/22/2016   Migraine 03/31/2016   Anxiety 03/31/2016   High risk medication use 03/31/2016   Primary osteoarthritis of both feet 03/31/2016   Primary osteoarthritis of both knees 03/31/2016   Stiffness of left hand joint 11/27/2014   Elevated IOP 06/09/2014   Long term current use of systemic steroids 06/09/2014   Medically complex patient 06/09/2014    Current use of steroid medication 01/09/2014   Xerosis of skin 12/07/2013   Encounter for therapeutic drug monitoring 06/09/2013   B12 deficiency 05/31/2013   Iron deficiency 05/31/2013   Visit for preventive health examination 05/25/2012   Mammogram abnormal 04/12/2012   Iliac crest  pain 01/23/2012   Vaginal atrophy 01/23/2012   History of recurrent UTI (urinary tract infection) 01/23/2012   Osteoporosis 04/29/2011   Nonspecific abnormal results of liver function study 12/08/2010   Chronic anticoagulation    Anticoagulant long-term use 07/15/2010   Anticardiolipin antibody positive 07/15/2010   Chronic cough 03/19/2009   CHEST PAIN-UNSPECIFIED 08/26/2008   PALPITATIONS, RECURRENT 07/24/2008   Vitamin D deficiency 05/30/2008   HYPERLIPIDEMIA 05/30/2008   IRON DEFICIENCY 05/30/2008   Adjustment disorder with anxiety 05/30/2008   Rheumatoid arthritis ([place]) 05/30/2008   PULMONARY EMBOLISM, HX OF 05/30/2008    PCP: Burnis Medin, MD   REFERRING PROVIDER: Bo Merino, MD   REFERRING DIAG: M51.36 (ICD-10-CM) - DDD (degenerative disc disease), lumbar M54.50 (ICD-10-CM) - Acute midline low back pain without sciatica   Rationale for Evaluation and Treatment Rehabilitation  THERAPY DIAG:  Other low back pain  Abnormal posture  Cramp and spasm  Muscle weakness (generalized)  Difficulty in walking, not elsewhere classified  ONSET DATE: 01/30/2022   SUBJECTIVE:                                                                                                                                                                                           SUBJECTIVE STATEMENT: Pt reports she did very well after last session on Friday until Sunday.  Her pain returned Sunday afternoon.      PERTINENT HISTORY:  Most recent MD note:  [name] is a 61 y.o. female with history of seronegative rheumatoid arthritis.  According the patient about 2 weeks ago she started  having lower back pain which gradually got worse.  She did not want to go to the emergency room as she was concerned about COVID-19 virus exposure.  She was seen by Dr.Xu on January 28, 2022.  She had x-rays of her lumbar spine which showed some degenerative changes.  She was offered a prednisone taper which she declined.  He also gave her prescription for Flexeril 5 mg which she has not started yet.  She states the Lidoderm patches were not covered by her insurance.  She continues to have lower back pain.  None of the other joints are painful or swollen.  She states that she had some discomfort in her left heel which comes and goes.  She has been taking methotrexate 8 tablets p.o. weekly and hydroxychloroquine 1 tablet p.o. twice daily Monday to Friday  PAIN:  Are you having pain? Yes: NPRS scale: 2/10 Pain location: low back Pain description: aching Aggravating factors: standing, sitting Relieving factors: heat prednisone dose pack   PRECAUTIONS: None  WEIGHT BEARING RESTRICTIONS No  FALLS:  Has patient fallen in last 6 months? No  LIVING ENVIRONMENT: Lives with: lives with their spouse Lives in: House/apartment Stairs: Yes: Internal: 12 steps; can reach both Has following equipment at home: None  OCCUPATION: Retired  PLOF: Independent, Independent with basic ADLs, Independent with household mobility without device, Independent with community mobility without device, Independent with homemaking with ambulation, Independent with gait, and Independent with transfers  PATIENT GOALS "I just want to be pain free"   OBJECTIVE:   DIAGNOSTIC FINDINGS:  Xray 01/28/22 Mild degenerative changes L5-S1.  Mild facet disease  PATIENT SURVEYS:  FOTO 57 goal is 23  SCREENING FOR RED FLAGS: Bowel or bladder incontinence: No Spinal tumors: No Cauda equina syndrome: No Compression fracture: No Abdominal aneurysm: No  COGNITION:  Overall cognitive status: Within functional limits for  tasks assessed     SENSATION: WFL  MUSCLE LENGTH: Hamstrings: Right 45 deg; Left 50 deg Thomas test: Right pos ; Left pos   POSTURE: increased  lumbar lordosis   LUMBAR ROM:   Active  A/PROM  eval  Flexion WNL  Extension WNL  Right lateral flexion Fingertips to distal thigh  Left lateral flexion Fingertips to joint line  Right rotation WNL  Left rotation WNL   (Blank rows = not tested)  LOWER EXTREMITY ROM:     WFL  LOWER EXTREMITY MMT:    Generally 4-/5 bilateral LE's  LUMBAR SPECIAL TESTS:  Straight leg raise test: Negative, Slump test: Negative, Stork standing: not tested, and FABER test: Negative  FUNCTIONAL TESTS:   Eval: 5 times sit to stand: test next visit Timed up and go (TUG): test next visit  02/21/2022: 5 times sit to stand: 14.6 sec with minimal UE pushing up from thighs Timed up and go (TUG):  9.5 sec without assistive device 3 min walk test:  730 ft without assistive device  GAIT: Distance walked: 50 Assistive device utilized: None Level of assistance: Complete Independence Comments: antalgic, guarded    TODAY'S TREATMENT  Date: 02/26/22 Nustep level 3 x 6 min with PT present to discuss status PPT x 20 PPT with 90/90 heel tap x 20 PPT with dying bug x 20 Lower trunk rotation x 20 Piriformis stretch 3 x 20 sec both supine Seated 3 way green pball rollout 5x10 sec each Seated piriformis stretch 2x20 sec bilat 4 way pelvic tilt seated on dynadisc Manual therapy using Addaday to lumbar multifidi, piriformis, hamstrings.  Pt verbalizes decreased decreased pain following.  Date: 02/21/22 Nustep level 3 x6 min with PT present to discuss status Objective testing:  5 times sit to/from stand, TUG, 3 min walk (see above) Seated 3 way green pball rollout 5x10 sec each Seated piriformis stretch 2x20 sec bilat 4 way pelvic tilt seated on dynadisc Education on use of a towel roll for lumbar support when sitting in a chair, pt reports decreased  pain with use. Education and demonstration of use of a lacrosse or tennis ball for manual trigger point release of piriformis in sitting vs standing.  Pt able to return demonstration. Manual therapy using Addaday to lumbar multifidi, piriformis, hamstrings.  Pt verbalizes decreased decreased pain following.   Date: 02/19/22 Discussed back brace once again, patient had questions and wanted PT to look at back braces with her.  Reminded patient that a back brace should only be worn intermittently to avoid core atrophy.  We looked at pictures of braces she found and gave suggestion on most appropriate brace.  Encouraged good walking schedule (patient seems to do a good bit of walking- states she walks for about an hour 3 times per week- suggested maybe reduce time to 30-45 min on level ground daily) Nustep x 6 min level 1 (PT present to discuss progress and questions) Prone quad/hip flexor stretch with strap 3 x 30 sec each LE Prone on elbows x 1 min Supine hamstring stretch and ITB stretch with strap 2x20 seconds bil Supine piriformis stretch 2x20 seconds  Seated piriformis and hamstring stretch  Pelvic tilt: with thumbs on ASIS and practicing TA activation while counting Open book x10    PATIENT EDUCATION:  Education details: Initiated HEP Person educated: Patient Education method: Consulting civil engineer, Media planner, Verbal cues, and Handouts Education comprehension: verbalized understanding, returned demonstration, and verbal cues required   HOME EXERCISE PROGRAM:  Access Code: [email or web address] URL: [email or web address] Date: 02/26/2022 Prepared by: Candyce Churn  Exercises - Supine Hamstring Stretch with Strap  - 1 x daily - 7 x weekly - 1 sets - 3 reps -  30 sec hold - Supine ITB Stretch with Strap  - 1 x daily - 7 x weekly - 1 sets - 3 reps - 30 sec hold - Supine Piriformis Stretch Pulling Heel to Hip  - 1 x daily - 7 x weekly - 1 sets - 3 reps - 30 sec hold - Supine Posterior  Pelvic Tilt  - 1 x daily - 7 x weekly - 1 sets - 20 reps - Standing Quad Stretch with Table and Chair Support  - 1 x daily - 7 x weekly - 1 sets - 3 reps - 30 sec hold - Seated Piriformis Stretch with Trunk Bend  - 1 x daily - 7 x weekly - 1 sets - 3 reps - 20 hold - Seated Hamstring Stretch  - 2 x daily - 7 x weekly - 1 sets - 3 reps - 20 hold - Sidelying Open Book Thoracic Lumbar Rotation and Extension  - 2 x daily - 7 x weekly - 1 sets - 10 reps - Clamshell  - 1 x daily - 7 x weekly - 2 sets - 10 reps - Prone Quadriceps Stretch with Strap  - 1 x daily - 7 x weekly - 1 sets - 3 reps - 30 sec hold - Supine 90/90 Alternating Heel Touches with Posterior Pelvic Tilt  - 1 x daily - 7 x weekly - 2 sets - 10 reps - Supine Dead Bug with Leg Extension  - 1 x daily - 7 x weekly - 2 sets - 10 reps   ASSESSMENT:  CLINICAL IMPRESSION: Patient is progressing appropriately.  She responds well to manual techniques.  We added more advanced core strengthening today without increased pain.  She should continue to progress.   Pt continues to require skilled PT to progress with goal related activities.   OBJECTIVE IMPAIRMENTS Abnormal gait, decreased mobility, difficulty walking, decreased ROM, decreased strength, increased fascial restrictions, increased muscle spasms, impaired flexibility, postural dysfunction, obesity, and pain.   ACTIVITY LIMITATIONS carrying, lifting, bending, sitting, standing, squatting, sleeping, stairs, transfers, bed mobility, and dressing  PARTICIPATION LIMITATIONS: meal prep, cleaning, laundry, driving, shopping, community activity, and yard work  PERSONAL FACTORS Behavior pattern, Fitness, Past/current experiences, Time since onset of injury/illness/exacerbation, and 1-2 comorbidities: chronic pain, RA  are also affecting patient's functional outcome.   REHAB POTENTIAL: Good  CLINICAL DECISION MAKING: Evolving/moderate complexity  EVALUATION COMPLEXITY:  Moderate   GOALS: Goals reviewed with patient? Yes  SHORT TERM GOALS: Target date: 03/11/2022  Pain report to be no greater than 4/10  Baseline: Goal status: IN PROGRESS  2.  Patient will be independent with initial HEP  Baseline:  Goal status: MET  3.  Patient to be able to stand or walk for at least 15 min without leg pain  Baseline:  Goal status: INITIAL   LONG TERM GOALS: Target date: 04/08/2022  Patient to report pain no greater than 2/10  Baseline:  Goal status: INITIAL  2.  Patient to be independent with advanced HEP  Baseline:  Goal status: INITIAL  3.  Patient to be able to stand or walk for at least 30 min without pain Baseline:  Goal status: INITIAL  4.  FOTO to be 71%. Baseline: 57% Goal status: INITIAL  5.  5 times sit to stand to improve by 2 seconds Baseline: 14.6 sec on 02/21/22 Goal status: INITIAL  6.  TUG test to improve by 2 seconds Baseline:  9.5 sec on 02/21/22 Goal status: INITIAL   PLAN:  PT FREQUENCY: 2x/week  PT DURATION: 8 weeks  PLANNED INTERVENTIONS: Therapeutic exercises, Therapeutic activity, Neuromuscular re-education, Balance training, Gait training, Patient/Family education, Self Care, Joint mobilization, Stair training, DME instructions, Aquatic Therapy, Dry Needling, Electrical stimulation, Spinal mobilization, Cryotherapy, Moist heat, Taping, Traction, Ultrasound, Ionotophoresis 64m/ml Dexamethasone, Manual therapy, and Re-evaluation.  PLAN FOR NEXT SESSION: Review HEP, Nustep, begin core strengthening.     JAnderson MaltaB. Natiya Seelinger, PT 02/26/22 10:14 AM   [place] [place] [address]Phone # [phone] [phone]

## 2022-02-26 NOTE — Patient Instructions (Signed)
Continue  5 mg (2 tablets) daily except take 2.5 mg (1 tablet) on Sundays and take 3.75 mg (1 1/2 tablet) on Thursdays. Recheck in 4 weeks.  

## 2022-02-27 ENCOUNTER — Other Ambulatory Visit: Payer: BC Managed Care – PPO

## 2022-02-27 LAB — POCT URINALYSIS DIPSTICK
Bilirubin, UA: NEGATIVE
Blood, UA: NEGATIVE
Glucose, UA: NEGATIVE
Ketones, UA: NEGATIVE
Leukocytes, UA: NEGATIVE
Nitrite, UA: NEGATIVE
Protein, UA: NEGATIVE
Spec Grav, UA: 1.015 (ref 1.010–1.025)
Urobilinogen, UA: 0.2 E.U./dL
pH, UA: 7.5 (ref 5.0–8.0)

## 2022-02-27 NOTE — Progress Notes (Signed)
Tell patient urine is clear  . No infection, no blood

## 2022-02-27 NOTE — Addendum Note (Signed)
Addended by: Encarnacion Slates on: 02/27/2022 10:18 AM   Modules accepted: Orders

## 2022-02-28 ENCOUNTER — Encounter: Payer: Self-pay | Admitting: Rehabilitative and Restorative Service Providers"

## 2022-02-28 ENCOUNTER — Ambulatory Visit: Payer: BC Managed Care – PPO | Admitting: Rehabilitative and Restorative Service Providers"

## 2022-02-28 DIAGNOSIS — M5459 Other low back pain: Secondary | ICD-10-CM | POA: Diagnosis not present

## 2022-02-28 DIAGNOSIS — M6281 Muscle weakness (generalized): Secondary | ICD-10-CM

## 2022-02-28 DIAGNOSIS — R293 Abnormal posture: Secondary | ICD-10-CM

## 2022-02-28 DIAGNOSIS — R262 Difficulty in walking, not elsewhere classified: Secondary | ICD-10-CM

## 2022-02-28 DIAGNOSIS — R252 Cramp and spasm: Secondary | ICD-10-CM

## 2022-02-28 NOTE — Therapy (Signed)
OUTPATIENT PHYSICAL THERAPY TREATMENT NOTE   Patient Name: [name] MRN: [id number] DOB:1960-06-10, 61 y.o., female Today's Date: 02/28/2022   PT End of Session - 02/28/22 0848     Visit Number 6    Date for PT Re-Evaluation 04/08/22    Authorization Type BC[employer]    PT Start Time 0845    PT Stop Time 0925    PT Time Calculation (min) 40 min    Activity Tolerance Patient tolerated treatment well    Behavior During Therapy WFL for tasks assessed/performed              Past Medical History:  Diagnosis Date   Adjustment disorder with anxiety    B12 deficiency due to diet    is a vegetarian   Chest pain, unspecified    Chronic anticoagulation    Fatty liver    Per patient   GERD (gastroesophageal reflux disease)    Headache(784.0)    migraines and head pain   Hx of varicella    As Child   Iron deficiency anemia, unspecified    Otalgia, unspecified    Other and unspecified hyperlipidemia    Palpitations    Personal history of venous thrombosis and embolism    Recurrent UTI    Rheumatoid arthritis(714.0)    deveshewar   Unspecified vitamin D deficiency    UTI (lower urinary tract infection) 11/28/2011   citrobacter      Past Surgical History:  Procedure Laterality Date   ORIF ULNAR FRACTURE     TYMPANOSTOMY TUBE PLACEMENT  2009   Right   Patient Active Problem List   Diagnosis Date Noted   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[place]) 01/06/2018   Long term (current) use of anticoagulants 02/11/2017   Sicca syndrome ([place]) 12/22/2016   ANA positive 12/22/2016   Raised intraocular pressure of both eyes 12/22/2016   Migraine 03/31/2016   Anxiety 03/31/2016   High risk medication use 03/31/2016   Primary osteoarthritis of both feet 03/31/2016   Primary osteoarthritis of both knees 03/31/2016   Stiffness of left hand joint 11/27/2014   Elevated IOP 06/09/2014   Long term current use of systemic steroids 06/09/2014   Medically complex patient 06/09/2014    Current use of steroid medication 01/09/2014   Xerosis of skin 12/07/2013   Encounter for therapeutic drug monitoring 06/09/2013   B12 deficiency 05/31/2013   Iron deficiency 05/31/2013   Visit for preventive health examination 05/25/2012   Mammogram abnormal 04/12/2012   Iliac crest  pain 01/23/2012   Vaginal atrophy 01/23/2012   History of recurrent UTI (urinary tract infection) 01/23/2012   Osteoporosis 04/29/2011   Nonspecific abnormal results of liver function study 12/08/2010   Chronic anticoagulation    Anticoagulant long-term use 07/15/2010   Anticardiolipin antibody positive 07/15/2010   Chronic cough 03/19/2009   CHEST PAIN-UNSPECIFIED 08/26/2008   PALPITATIONS, RECURRENT 07/24/2008   Vitamin D deficiency 05/30/2008   HYPERLIPIDEMIA 05/30/2008   IRON DEFICIENCY 05/30/2008   Adjustment disorder with anxiety 05/30/2008   Rheumatoid arthritis ([place]) 05/30/2008   PULMONARY EMBOLISM, HX OF 05/30/2008    PCP: Burnis Medin, MD   REFERRING PROVIDER: Bo Merino, MD   REFERRING DIAG: M51.36 (ICD-10-CM) - DDD (degenerative disc disease), lumbar M54.50 (ICD-10-CM) - Acute midline low back pain without sciatica   Rationale for Evaluation and Treatment Rehabilitation  THERAPY DIAG:  Other low back pain  Abnormal posture  Cramp and spasm  Muscle weakness (generalized)  Difficulty in walking, not elsewhere classified  ONSET DATE: 01/30/2022   SUBJECTIVE:                                                                                                                                                                                           SUBJECTIVE STATEMENT: Pt reports almost no pain following last session, but that pain has started to return this morning.    PERTINENT HISTORY:  Most recent MD note:  [name] is a 61 y.o. female with history of seronegative rheumatoid arthritis.  According the patient about 2 weeks ago she started having lower  back pain which gradually got worse.  She did not want to go to the emergency room as she was concerned about COVID-19 virus exposure.  She was seen by Dr.Xu on January 28, 2022.  She had x-rays of her lumbar spine which showed some degenerative changes.  She was offered a prednisone taper which she declined.  He also gave her prescription for Flexeril 5 mg which she has not started yet.  She states the Lidoderm patches were not covered by her insurance.  She continues to have lower back pain.  None of the other joints are painful or swollen.  She states that she had some discomfort in her left heel which comes and goes.  She has been taking methotrexate 8 tablets p.o. weekly and hydroxychloroquine 1 tablet p.o. twice daily Monday to Friday  PAIN:  Are you having pain? Yes: NPRS scale: 2/10 Pain location: low back Pain description: aching Aggravating factors: standing, sitting Relieving factors: heat prednisone dose pack   PRECAUTIONS: None  WEIGHT BEARING RESTRICTIONS No  FALLS:  Has patient fallen in last 6 months? No  LIVING ENVIRONMENT: Lives with: lives with their spouse Lives in: House/apartment Stairs: Yes: Internal: 12 steps; can reach both Has following equipment at home: None  OCCUPATION: Retired  PLOF: Independent, Independent with basic ADLs, Independent with household mobility without device, Independent with community mobility without device, Independent with homemaking with ambulation, Independent with gait, and Independent with transfers  PATIENT GOALS "I just want to be pain free"   OBJECTIVE:   DIAGNOSTIC FINDINGS:  Xray 01/28/22 Mild degenerative changes L5-S1.  Mild facet disease  PATIENT SURVEYS:  FOTO 57 goal is 79  SCREENING FOR RED FLAGS: Bowel or bladder incontinence: No Spinal tumors: No Cauda equina syndrome: No Compression fracture: No Abdominal aneurysm: No  COGNITION:  Overall cognitive status: Within functional limits for tasks  assessed     SENSATION: WFL  MUSCLE LENGTH: Hamstrings: Right 45 deg; Left 50 deg Thomas test: Right pos ; Left pos   POSTURE: increased lumbar lordosis  LUMBAR ROM:   Active  A/PROM  eval  Flexion WNL  Extension WNL  Right lateral flexion Fingertips to distal thigh  Left lateral flexion Fingertips to joint line  Right rotation WNL  Left rotation WNL   (Blank rows = not tested)  LOWER EXTREMITY ROM:     WFL  LOWER EXTREMITY MMT:    Generally 4-/5 bilateral LE's  LUMBAR SPECIAL TESTS:  Straight leg raise test: Negative, Slump test: Negative, Stork standing: not tested, and FABER test: Negative  FUNCTIONAL TESTS:   Eval: 5 times sit to stand: test next visit Timed up and go (TUG): test next visit  02/21/2022: 5 times sit to stand: 14.6 sec with minimal UE pushing up from thighs Timed up and go (TUG):  9.5 sec without assistive device 3 min walk test:  730 ft without assistive device  GAIT: Distance walked: 50 Assistive device utilized: None Level of assistance: Complete Independence Comments: antalgic, guarded    TODAY'S TREATMENT  Date: 02/28/22 Nustep level 4 x 6 min with PT present to discuss status PPT x 20 PPT with 90/90 heel tap x 20 PPT with dying bug x 20 with max cuing for technique and sequencing Lower trunk rotation 5x10 sec bilat Seated piriformis stretch 2x20 sec bilat Seated hamstring stretch 2x20 sec bilat 4 way pelvic tilt seated on dynadisc x20 each with cuing for technique Manual therapy using Addaday to lumbar multifidi, piriformis, hamstrings.  Pt verbalizes decreased decreased pain following   Date: 02/26/22 Nustep level 3 x 6 min with PT present to discuss status PPT x 20 PPT with 90/90 heel tap x 20 PPT with dying bug x 20 Lower trunk rotation x 20 Piriformis stretch 3 x 20 sec both supine Seated 3 way green pball rollout 5x10 sec each Seated piriformis stretch 2x20 sec bilat 4 way pelvic tilt seated on dynadisc Manual  therapy using Addaday to lumbar multifidi, piriformis, hamstrings.  Pt verbalizes decreased decreased pain following.  Date: 02/21/22 Nustep level 3 x6 min with PT present to discuss status Objective testing:  5 times sit to/from stand, TUG, 3 min walk (see above) Seated 3 way green pball rollout 5x10 sec each Seated piriformis stretch 2x20 sec bilat 4 way pelvic tilt seated on dynadisc Education on use of a towel roll for lumbar support when sitting in a chair, pt reports decreased pain with use. Education and demonstration of use of a lacrosse or tennis ball for manual trigger point release of piriformis in sitting vs standing.  Pt able to return demonstration. Manual therapy using Addaday to lumbar multifidi, piriformis, hamstrings.  Pt verbalizes decreased decreased pain following.     PATIENT EDUCATION:  Education details: Initiated HEP Person educated: Patient Education method: Consulting civil engineer, Media planner, Verbal cues, and Handouts Education comprehension: verbalized understanding, returned demonstration, and verbal cues required   HOME EXERCISE PROGRAM:  Access Code: [email or web address] URL: [email or web address] Date: 02/28/2022 Prepared by: Shelby Dubin Sequoyah Ramone  Exercises - Supine Hamstring Stretch with Strap  - 1 x daily - 7 x weekly - 1 sets - 3 reps - 30 sec hold - Supine ITB Stretch with Strap  - 1 x daily - 7 x weekly - 1 sets - 3 reps - 30 sec hold - Supine Piriformis Stretch Pulling Heel to Hip  - 1 x daily - 7 x weekly - 1 sets - 3 reps - 30 sec hold - Supine Posterior Pelvic Tilt  - 1 x daily - 7 x weekly - 1 sets - 20 reps -  Standing Sports administrator with Table and Chair Support  - 1 x daily - 7 x weekly - 1 sets - 3 reps - 30 sec hold - Seated Piriformis Stretch  - 1 x daily - 7 x weekly - 1 sets - 3 reps - 20 sec hold - Seated Hamstring Stretch  - 2 x daily - 7 x weekly - 1 sets - 3 reps - 20 hold - Sidelying Open Book Thoracic Lumbar Rotation and Extension  - 2 x  daily - 7 x weekly - 1 sets - 10 reps - Clamshell  - 1 x daily - 7 x weekly - 2 sets - 10 reps - Prone Quadriceps Stretch with Strap  - 1 x daily - 7 x weekly - 1 sets - 3 reps - 30 sec hold - Supine 90/90 Alternating Heel Touches with Posterior Pelvic Tilt  - 1 x daily - 7 x weekly - 2 sets - 10 reps - Supine Dead Bug with Leg Extension  - 1 x daily - 7 x weekly - 2 sets - 10 reps   ASSESSMENT:  CLINICAL IMPRESSION: Ms Noorani presents to skilled PT with reports of feeling better following last PT session, with almost no pain until this morning.  Pt states that she was having difficulty with some of the seated stretches and having increased back pain.  Provided cuing for hamstring stretch to maintain upright back posture and changed seated piriformis stretch to maintain back posture.  Pt provided with updated handout for piriformis stretch.  Pt continues to state relief of pain with manual addaday.  Pt continues to require skilled PT to progress towards goal related activities.   OBJECTIVE IMPAIRMENTS Abnormal gait, decreased mobility, difficulty walking, decreased ROM, decreased strength, increased fascial restrictions, increased muscle spasms, impaired flexibility, postural dysfunction, obesity, and pain.   ACTIVITY LIMITATIONS carrying, lifting, bending, sitting, standing, squatting, sleeping, stairs, transfers, bed mobility, and dressing  PARTICIPATION LIMITATIONS: meal prep, cleaning, laundry, driving, shopping, community activity, and yard work  PERSONAL FACTORS Behavior pattern, Fitness, Past/current experiences, Time since onset of injury/illness/exacerbation, and 1-2 comorbidities: chronic pain, RA  are also affecting patient's functional outcome.   REHAB POTENTIAL: Good  CLINICAL DECISION MAKING: Evolving/moderate complexity  EVALUATION COMPLEXITY: Moderate   GOALS: Goals reviewed with patient? Yes  SHORT TERM GOALS: Target date: 03/11/2022  Pain report to be no greater  than 4/10  Baseline: Goal status: IN PROGRESS  2.  Patient will be independent with initial HEP  Baseline:  Goal status: MET  3.  Patient to be able to stand or walk for at least 15 min without leg pain  Baseline:  Goal status: INITIAL   LONG TERM GOALS: Target date: 04/08/2022  Patient to report pain no greater than 2/10  Baseline:  Goal status: INITIAL  2.  Patient to be independent with advanced HEP  Baseline:  Goal status: INITIAL  3.  Patient to be able to stand or walk for at least 30 min without pain Baseline:  Goal status: INITIAL  4.  FOTO to be 71%. Baseline: 57% Goal status: INITIAL  5.  5 times sit to stand to improve by 2 seconds Baseline: 14.6 sec on 02/21/22 Goal status: INITIAL  6.  TUG test to improve by 2 seconds Baseline:  9.5 sec on 02/21/22 Goal status: INITIAL   PLAN: PT FREQUENCY: 2x/week  PT DURATION: 8 weeks  PLANNED INTERVENTIONS: Therapeutic exercises, Therapeutic activity, Neuromuscular re-education, Balance training, Gait training, Patient/Family education, Self Care, Joint  mobilization, Stair training, DME instructions, Aquatic Therapy, Dry Needling, Electrical stimulation, Spinal mobilization, Cryotherapy, Moist heat, Taping, Traction, Ultrasound, Ionotophoresis 19m/ml Dexamethasone, Manual therapy, and Re-evaluation.  PLAN FOR NEXT SESSION: Review HEP, Nustep, begin core strengthening.     SJuel Burrow PT 02/28/22 10:41 AM   [facility] [address] [place]villeGAn[place] [address]Phone # [phone] [phone]

## 2022-03-02 ENCOUNTER — Other Ambulatory Visit: Payer: Self-pay | Admitting: Rheumatology

## 2022-03-03 NOTE — Telephone Encounter (Signed)
Next Visit: 06/10/2022  Last Visit: 01/30/2022  Last Fill: 12/05/2021  DX: Rheumatoid arthritis of multiple sites with negative rheumatoid factor   Current Dose per office note 01/30/2022: Methotrexate 8 tablets by mouth every week  Labs: 01/30/2022 CBC and CMP are normal.    Okay to refill MTX?

## 2022-03-04 ENCOUNTER — Ambulatory Visit: Payer: BC Managed Care – PPO

## 2022-03-04 ENCOUNTER — Ambulatory Visit: Payer: BC Managed Care – PPO | Admitting: Psychology

## 2022-03-04 DIAGNOSIS — F4322 Adjustment disorder with anxiety: Secondary | ICD-10-CM | POA: Diagnosis not present

## 2022-03-04 NOTE — Progress Notes (Signed)
03/04/2022  Treatrment Plan:  Diagnosis F41.1 (Generalized anxiety disorder) [n/a]  Z62.820 (Parent child relationship problem) [n/a]  Symptoms Excessive and/or unrealistic worry that is difficult to control occurring more days than not for at least 6 months about a number of events or activities. (Status: maintained) -- No Description Entered  Regularly overindulges their child's wishes and demands. (Status: maintained) -- No Description Entered  Medication Status compliance  Safety none  If Suicidal or Homicidal State Action Taken: unspecified  Current Risk: low Medications unspecified Objectives Related Problem: Achieve a level of competent, effective parenting. Description: Increase the gradual letting go of their adolescent in constructive, affirmative ways. Target Date: 2022-05-14 Frequency: Daily Modality: individual Progress: 80%  Related Problem: Achieve a level of competent, effective parenting. Description: Identify unresolved childhood issues that affect parenting and work toward their resolution. Target Date: 2022-05-14 Frequency: Daily Modality: individual Progress: 50%  Related Problem: Achieve a level of competent, effective parenting. Description: Freely express feelings of frustration, helplessness, and inadequacy that each experiences in the parenting role. Target Date: 2022-05-14 Frequency: Daily Modality: individual Progress: 80%  Related Problem: Resolve the core conflict that is the source of anxiety. Description: Learn and implement problem-solving strategies for realistically addressing worries. Target Date: 2022-05-14 Frequency: Daily Modality: individual Progress: 70%  Related Problem: Resolve the core conflict that is the source of anxiety. Description: Learn and implement calming skills to reduce overall anxiety and manage anxiety symptoms. Target  Date: 2022-05-14 Frequency: Daily Modality: individual Progress: 70%  Related Problem: Resolve the core conflict that is the source of anxiety. Description: Describe situations, thoughts, feelings, and actions associated with anxieties and worries, their impact on functioning, and attempts to resolve them. Target Date: 2022-05-14 Frequency: Daily Modality: individual Progress: 60%  Client Response full compliance  Service Location Location, [address] [place], [place] [address]  Service Code cpt [phone]  Behavioral activation plan  Facilitate problem solving  Identify automatic thoughts  Emotion regulation skills  Provide education, information  Self care activities  Relaxation training  Lifestyle change (exercise, nutrition)  Self-monitoring  Validate/empathize  Session notes: F 41.1  Goals: She has chronic anxieties, mostly related to health concerns and her son's well-being. Would like to develop strategies to manage symptoms of anxiety. Needs to understand enmeshment and learn to define and manage appropriate family boundaries. Target date is 12-23. Patient has realized significant improvement in recognizing and establishing appropriate boundaries. Anxiety about health and family matters persist and she desires to continue treatment in an effort to manage and reduce these feelings. Target date is 12-23.  Meds: Xanax (.25 mg)  Patient agrees to a video session. She is at home and I am in my home office.  Jennilee and [name]: [name] has been taking the Lexapro regularly and he thinks it is helping him feel better. This is a great relief to [employer]. He still gets anxious about his school work and shares it with his mother. This contributes to her ongoing anxiety and enmeshed relationship. He has not changed his behavior with regard to work life balance. He says he is satisfied with this choice and has no regrets. He admits that his identity is wrapped up in his academic  success. He is  caught up in "comparison is the thief of joy" and keeps him locked into a pattern of being singularly focused of achieving on objective measures. He claims he is "grinding through" this current academic program because he knows "there is an end". This is all very distressing for Babafshef in that she feels her son will never achieve happiness. We talked about re-defining what her son considers as life satisfaction. He maintains that the only way for him to have balance is to find something else in his life that he values and it needs to be something that makes him feel important. We discussed that teaching may serve that function. Banafshef is doing a little better with her anxiety and is moving into a more practical and realistic mode.            Marcelina Morel, PhD 8:40a-9:30a 50 min

## 2022-03-05 ENCOUNTER — Telehealth: Payer: Self-pay | Admitting: Internal Medicine

## 2022-03-05 ENCOUNTER — Ambulatory Visit: Payer: BC Managed Care – PPO | Admitting: Internal Medicine

## 2022-03-05 ENCOUNTER — Encounter: Payer: Self-pay | Admitting: Internal Medicine

## 2022-03-05 NOTE — Telephone Encounter (Signed)
Patient thinks she has a UTI, next available OV in November. says provider told her if there were no appointments available she would squeeze her in. Please advise

## 2022-03-05 NOTE — Telephone Encounter (Signed)
Pt called back to say we can disregard her request, as she spoke to her other provider, who prescribed her some antibiotics. Pt states  she is okay with that.

## 2022-03-06 ENCOUNTER — Ambulatory Visit: Payer: BC Managed Care – PPO

## 2022-03-06 NOTE — Telephone Encounter (Signed)
Thanks for the update  and that her problem has been taken are of.

## 2022-03-06 NOTE — Telephone Encounter (Signed)
Arrange for UA and Urine culture this am and then add on to the virtual schedule today.

## 2022-03-10 ENCOUNTER — Telehealth: Payer: Self-pay | Admitting: Physical Therapy

## 2022-03-10 NOTE — Telephone Encounter (Signed)
Spoke with patient. See other message. 

## 2022-03-10 NOTE — Telephone Encounter (Signed)
Pt phoned clinic with complaints about her bill requesting to speak to the supervisor. Her concerns were that she was never told we bill as a hospital based facility, she did not know we are part of [facility], and she thought she just had a $26 copay. I explained that we tell every patient when they come for their eval that we bill as a hospital based facility and that her deductible of $1250 must be met before her insurance will pay. I explained that she was billed $700 in September and her insurance allowable is $389 so that is what she is responsible for. I explained that she has been seen 4 times in October and she has not yet been billed for that but that it is likely she will either meet or be close to meeting her deductible and then will have a co-insurance each visit. Patient spoke for a long time about her dissatisfaction with the way insurance was communicated to her and frustration with our billing process. I listened at length and then gave her phone numbers for the Office of the Patient Experience and Patient Accounting. Annia Friendly, [place] 03/10/22 9:55 AM

## 2022-03-11 ENCOUNTER — Telehealth: Payer: Self-pay | Admitting: Orthopaedic Surgery

## 2022-03-11 ENCOUNTER — Ambulatory Visit: Payer: BC Managed Care – PPO

## 2022-03-11 NOTE — Telephone Encounter (Signed)
Received call from patient. She is requesting 9/12 spine xray on CD. Please call when ready, [phone]. May leave msg if no answer. Thank you! (Verbal auth received)

## 2022-03-11 NOTE — Telephone Encounter (Signed)
Patient aware CD is ready for pickup at front desk.

## 2022-03-12 ENCOUNTER — Ambulatory Visit: Payer: BC Managed Care – PPO | Admitting: Psychology

## 2022-03-12 DIAGNOSIS — F4322 Adjustment disorder with anxiety: Secondary | ICD-10-CM

## 2022-03-12 NOTE — Progress Notes (Signed)
03/12/2022  Treatrment Plan:  Diagnosis F41.1 (Generalized anxiety disorder) [n/a]  Z62.820 (Parent child relationship problem) [n/a]  Symptoms Excessive and/or unrealistic worry that is difficult to control occurring more days than not for at least 6 months about a number of events or activities. (Status: maintained) -- No Description Entered  Regularly overindulges their child's wishes and demands. (Status: maintained) -- No Description Entered  Medication Status compliance  Safety none  If Suicidal or Homicidal State Action Taken: unspecified  Current Risk: low Medications unspecified Objectives Related Problem: Achieve a level of competent, effective parenting. Description: Increase the gradual letting go of their adolescent in constructive, affirmative ways. Target Date: 2022-05-14 Frequency: Daily Modality: individual Progress: 80%  Related Problem: Achieve a level of competent, effective parenting. Description: Identify unresolved childhood issues that affect parenting and work toward their resolution. Target Date: 2022-05-14 Frequency: Daily Modality: individual Progress: 50%  Related Problem: Achieve a level of competent, effective parenting. Description: Freely express feelings of frustration, helplessness, and inadequacy that each experiences in the parenting role. Target Date: 2022-05-14 Frequency: Daily Modality: individual Progress: 80%  Related Problem: Resolve the core conflict that is the source of anxiety. Description: Learn and implement problem-solving strategies for realistically addressing worries. Target Date: 2022-05-14 Frequency: Daily Modality: individual Progress: 70%  Related Problem: Resolve the core conflict that is the source of anxiety. Description: Learn and implement calming skills to reduce overall anxiety and manage anxiety symptoms. Target Date: 2022-05-14 Frequency: Daily Modality: individual Progress: 70%  Related Problem:  Resolve the core conflict that is the source of anxiety. Description: Describe situations, thoughts, feelings, and actions associated with anxieties and worries, their impact on functioning, and attempts to resolve them. Target Date: 2022-05-14 Frequency: Daily Modality: individual Progress: 60%  Client Response full compliance  Service Location Location, [address] [place], [place] [address]  Service Code cpt [phone]  Behavioral activation plan  Facilitate problem solving  Identify automatic thoughts  Emotion regulation skills  Provide education, information  Self care activities  Relaxation training  Lifestyle change (exercise, nutrition)  Self-monitoring  Validate/empathize  Session notes: F 41.1  Goals: She has chronic anxieties, mostly related to health concerns and her son's well-being. Would like to develop strategies to manage symptoms of anxiety. Needs to understand enmeshment and learn to define and manage appropriate family boundaries. Target date is 12-23. Patient has realized significant improvement in recognizing and establishing appropriate boundaries. Anxiety about health and family matters persist and she desires to continue treatment in an effort to manage and reduce these feelings. Target date is 12-23.  Meds: Xanax (.25 mg)  Patient agrees to a video session. She is at home and I am in my home office.  [name] and [name]: [name] says that she has been experiencing significant back pain, but will be seeing a doctor for diagnosis and treatment. She expresses concern that her son does not seem concerned about looking for a job after graduation. He agrees that he is not in any hurry. He says one element of his hesitation is that applying to jobs takes away his focus on school. He did acknowledge that he was able to perform well at school even when he had limited time to complete a project. [name] says that she is frustrated and anxious about her son's lack of  urgency to find a job. He maintains he is very Herbalist. Sorousch and his mother both recognize that regardless of his geographic preference, he is very likely to repeat his pattern  of isolation and compulsive working. She is saddened by her son's inability to break his pattern             Krystal Morel, PhD 2:10p-3:00p 50 min

## 2022-03-13 ENCOUNTER — Ambulatory Visit: Payer: BC Managed Care – PPO

## 2022-03-18 ENCOUNTER — Ambulatory Visit: Payer: BC Managed Care – PPO

## 2022-03-20 ENCOUNTER — Ambulatory Visit: Payer: BC Managed Care – PPO | Attending: Rheumatology

## 2022-03-20 DIAGNOSIS — R262 Difficulty in walking, not elsewhere classified: Secondary | ICD-10-CM | POA: Insufficient documentation

## 2022-03-20 DIAGNOSIS — M5459 Other low back pain: Secondary | ICD-10-CM | POA: Insufficient documentation

## 2022-03-20 DIAGNOSIS — R293 Abnormal posture: Secondary | ICD-10-CM | POA: Insufficient documentation

## 2022-03-20 DIAGNOSIS — R252 Cramp and spasm: Secondary | ICD-10-CM | POA: Diagnosis present

## 2022-03-20 DIAGNOSIS — M6281 Muscle weakness (generalized): Secondary | ICD-10-CM | POA: Insufficient documentation

## 2022-03-20 NOTE — Therapy (Signed)
OUTPATIENT PHYSICAL THERAPY TREATMENT NOTE   Patient Name: [name] MRN: [id number] DOB:[birth year], 61 y.o., female Today's Date: 03/20/2022   PT End of Session - 03/20/22 0944     Visit Number 7    Date for PT Re-Evaluation 04/08/22    Authorization Type BC[employer]    PT Start Time 0933    PT Stop Time 3664    PT Time Calculation (min) 42 min    Activity Tolerance Patient tolerated treatment well    Behavior During Therapy WFL for tasks assessed/performed              Past Medical History:  Diagnosis Date   Adjustment disorder with anxiety    B12 deficiency due to diet    is a vegetarian   Chest pain, unspecified    Chronic anticoagulation    Fatty liver    Per patient   GERD (gastroesophageal reflux disease)    Headache(784.0)    migraines and head pain   Hx of varicella    As Child   Iron deficiency anemia, unspecified    Otalgia, unspecified    Other and unspecified hyperlipidemia    Palpitations    Personal history of venous thrombosis and embolism    Recurrent UTI    Rheumatoid arthritis(714.0)    deveshewar   Unspecified vitamin D deficiency    UTI (lower urinary tract infection) 11/28/2011   citrobacter      Past Surgical History:  Procedure Laterality Date   ORIF ULNAR FRACTURE     TYMPANOSTOMY TUBE PLACEMENT  2009   Right   Patient Active Problem List   Diagnosis Date Noted   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[place]) 01/06/2018   Long term (current) use of anticoagulants 02/11/2017   Sicca syndrome ([place]) 12/22/2016   ANA positive 12/22/2016   Raised intraocular pressure of both eyes 12/22/2016   Migraine 03/31/2016   Anxiety 03/31/2016   High risk medication use 03/31/2016   Primary osteoarthritis of both feet 03/31/2016   Primary osteoarthritis of both knees 03/31/2016   Stiffness of left hand joint 11/27/2014   Elevated IOP 06/09/2014   Long term current use of systemic steroids 06/09/2014   Medically complex patient 06/09/2014    Current use of steroid medication 01/09/2014   Xerosis of skin 12/07/2013   Encounter for therapeutic drug monitoring 06/09/2013   B12 deficiency 05/31/2013   Iron deficiency 05/31/2013   Visit for preventive health examination 05/25/2012   Mammogram abnormal 04/12/2012   Iliac crest  pain 01/23/2012   Vaginal atrophy 01/23/2012   History of recurrent UTI (urinary tract infection) 01/23/2012   Osteoporosis 04/29/2011   Nonspecific abnormal results of liver function study 12/08/2010   Chronic anticoagulation    Anticoagulant long-term use 07/15/2010   Anticardiolipin antibody positive 07/15/2010   Chronic cough 03/19/2009   CHEST PAIN-UNSPECIFIED 08/26/2008   PALPITATIONS, RECURRENT 07/24/2008   Vitamin D deficiency 05/30/2008   HYPERLIPIDEMIA 05/30/2008   IRON DEFICIENCY 05/30/2008   Adjustment disorder with anxiety 05/30/2008   Rheumatoid arthritis ([place]) 05/30/2008   PULMONARY EMBOLISM, HX OF 05/30/2008    PCP: Burnis Medin, MD   REFERRING PROVIDER: Bo Merino, MD   REFERRING DIAG: M51.36 (ICD-10-CM) - DDD (degenerative disc disease), lumbar M54.50 (ICD-10-CM) - Acute midline low back pain without sciatica   Rationale for Evaluation and Treatment Rehabilitation  THERAPY DIAG:  Other low back pain  Abnormal posture  Cramp and spasm  Muscle weakness (generalized)  Difficulty in walking, not elsewhere classified  ONSET DATE: 01/30/2022   SUBJECTIVE:                                                                                                                                                                                           SUBJECTIVE STATEMENT: Pt reports some return of pain but doing her exercises.  PERTINENT HISTORY:  Most recent MD note:  Kanetra Ho is a 61 y.o. female with history of seronegative rheumatoid arthritis.  According the patient about 2 weeks ago she started having lower back pain which gradually got worse.  She did not  want to go to the emergency room as she was concerned about COVID-19 virus exposure.  She was seen by Dr.Xu on January 28, 2022.  She had x-rays of her lumbar spine which showed some degenerative changes.  She was offered a prednisone taper which she declined.  He also gave her prescription for Flexeril 5 mg which she has not started yet.  She states the Lidoderm patches were not covered by her insurance.  She continues to have lower back pain.  None of the other joints are painful or swollen.  She states that she had some discomfort in her left heel which comes and goes.  She has been taking methotrexate 8 tablets p.o. weekly and hydroxychloroquine 1 tablet p.o. twice daily Monday to Friday  PAIN:  Are you having pain? Yes: NPRS scale: 2/10 Pain location: low back Pain description: aching Aggravating factors: standing, sitting Relieving factors: heat prednisone dose pack   PRECAUTIONS: None  WEIGHT BEARING RESTRICTIONS No  FALLS:  Has patient fallen in last 6 months? No  LIVING ENVIRONMENT: Lives with: lives with their spouse Lives in: House/apartment Stairs: Yes: Internal: 12 steps; can reach both Has following equipment at home: None  OCCUPATION: Retired  PLOF: Independent, Independent with basic ADLs, Independent with household mobility without device, Independent with community mobility without device, Independent with homemaking with ambulation, Independent with gait, and Independent with transfers  PATIENT GOALS "I just want to be pain free"   OBJECTIVE:   DIAGNOSTIC FINDINGS:  Xray 01/28/22 Mild degenerative changes L5-S1.  Mild facet disease  PATIENT SURVEYS:  FOTO 57 goal is 63  SCREENING FOR RED FLAGS: Bowel or bladder incontinence: No Spinal tumors: No Cauda equina syndrome: No Compression fracture: No Abdominal aneurysm: No  COGNITION:  Overall cognitive status: Within functional limits for tasks assessed     SENSATION: WFL  MUSCLE  LENGTH: Hamstrings: Right 45 deg; Left 50 deg Thomas test: Right pos ; Left pos   POSTURE: increased lumbar lordosis   LUMBAR ROM:   Active  A/PROM  eval  Flexion WNL  Extension WNL  Right lateral flexion Fingertips to distal thigh  Left lateral flexion Fingertips to joint line  Right rotation WNL  Left rotation WNL   (Blank rows = not tested)  LOWER EXTREMITY ROM:     WFL  LOWER EXTREMITY MMT:    Generally 4-/5 bilateral LE's  LUMBAR SPECIAL TESTS:  Straight leg raise test: Negative, Slump test: Negative, Stork standing: not tested, and FABER test: Negative  FUNCTIONAL TESTS:   Eval: 5 times sit to stand: test next visit Timed up and go (TUG): test next visit  02/21/2022: 5 times sit to stand: 14.6 sec with minimal UE pushing up from thighs Timed up and go (TUG):  9.5 sec without assistive device 3 min walk test:  730 ft without assistive device  GAIT: Distance walked: 50 Assistive device utilized: None Level of assistance: Complete Independence Comments: antalgic, guarded    TODAY'S TREATMENT  Date: 03/20/22 Nustep level 4 x 6 min with PT present to discuss status PPT x 20 (needed heavy verbal and tactile cues for engaging TA PPT with 90/90 heel tap x 20 (heavy verbal and tactile cues to maintain PPT) PPT with dying bug x 20 (patient improving with sequencing) Lower trunk rotation 5x10 sec bilat PPT with ball pass (patient needed as much as 15 min to get the correct technique on this exercise and lost the ability to recall how to obtain neutral pelvis/pelvic tilt)     Date: 02/28/22 Nustep level 4 x 6 min with PT present to discuss status PPT x 20 PPT with 90/90 heel tap x 20 PPT with dying bug x 20 with max cuing for technique and sequencing Lower trunk rotation 5x10 sec bilat Seated piriformis stretch 2x20 sec bilat Seated hamstring stretch 2x20 sec bilat 4 way pelvic tilt seated on dynadisc x20 each with cuing for technique Manual therapy using  Addaday to lumbar multifidi, piriformis, hamstrings.  Pt verbalizes decreased decreased pain following   Date: 02/26/22 Nustep level 3 x 6 min with PT present to discuss status PPT x 20 PPT with 90/90 heel tap x 20 PPT with dying bug x 20 Lower trunk rotation x 20 Piriformis stretch 3 x 20 sec both supine Seated 3 way green pball rollout 5x10 sec each Seated piriformis stretch 2x20 sec bilat 4 way pelvic tilt seated on dynadisc Manual therapy using Addaday to lumbar multifidi, piriformis, hamstrings.  Pt verbalizes decreased decreased pain following.  PATIENT EDUCATION:  Education details: Initiated HEP Person educated: Patient Education method: Consulting civil engineer, Media planner, Verbal cues, and Handouts Education comprehension: verbalized understanding, returned demonstration, and verbal cues required   HOME EXERCISE PROGRAM:  Access Code: [email or web address] URL: [email or web address] Date: 03/20/2022 Prepared by: Candyce Churn  Exercises - Supine Hamstring Stretch with Strap  - 1 x daily - 7 x weekly - 1 sets - 3 reps - 30 sec hold - Supine ITB Stretch with Strap  - 1 x daily - 7 x weekly - 1 sets - 3 reps - 30 sec hold - Supine Piriformis Stretch Pulling Heel to Hip  - 1 x daily - 7 x weekly - 1 sets - 3 reps - 30 sec hold - Supine Posterior Pelvic Tilt  - 1 x daily - 7 x weekly - 1 sets - 20 reps - Standing Quad Stretch with Table and Chair Support  - 1 x daily - 7 x weekly - 1 sets - 3 reps - 30 sec hold - Seated Piriformis Stretch  - 1 x  daily - 7 x weekly - 1 sets - 3 reps - 20 sec hold - Seated Hamstring Stretch  - 2 x daily - 7 x weekly - 1 sets - 3 reps - 20 hold - Sidelying Open Book Thoracic Lumbar Rotation and Extension  - 2 x daily - 7 x weekly - 1 sets - 10 reps - Clamshell  - 1 x daily - 7 x weekly - 2 sets - 10 reps - Prone Quadriceps Stretch with Strap  - 1 x daily - 7 x weekly - 1 sets - 3 reps - 30 sec hold - Supine 90/90 Alternating Heel Touches with  Posterior Pelvic Tilt  - 1 x daily - 7 x weekly - 2 sets - 10 reps - Supine Dead Bug with Leg Extension  - 1 x daily - 7 x weekly - 2 sets - 10 reps - Reverse Crunch with Mini Swiss Ball  - 1 x daily - 7 x weekly - 2 sets - 10 repsAccess Code: [email or web address] URL: [email or web address] Date: 02/28/2022 Prepared by: Shelby Dubin Menke    ASSESSMENT:  CLINICAL IMPRESSION: Ms Ceja is very diligent with her HEP but has a difficult time with correct technique and needs heavy review regularly.  When a new exercise is introduced, she needs almost half a session to learn correct technique.  She will likely be most successful with a simple 3-4 exercise program.    Pt continues to require skilled PT to progress towards goal related activities.   OBJECTIVE IMPAIRMENTS Abnormal gait, decreased mobility, difficulty walking, decreased ROM, decreased strength, increased fascial restrictions, increased muscle spasms, impaired flexibility, postural dysfunction, obesity, and pain.   ACTIVITY LIMITATIONS carrying, lifting, bending, sitting, standing, squatting, sleeping, stairs, transfers, bed mobility, and dressing  PARTICIPATION LIMITATIONS: meal prep, cleaning, laundry, driving, shopping, community activity, and yard work  PERSONAL FACTORS Behavior pattern, Fitness, Past/current experiences, Time since onset of injury/illness/exacerbation, and 1-2 comorbidities: chronic pain, RA  are also affecting patient's functional outcome.   REHAB POTENTIAL: Good  CLINICAL DECISION MAKING: Evolving/moderate complexity  EVALUATION COMPLEXITY: Moderate   GOALS: Goals reviewed with patient? Yes  SHORT TERM GOALS: Target date: 03/11/2022  Pain report to be no greater than 4/10  Baseline: Goal status: IN PROGRESS  2.  Patient will be independent with initial HEP  Baseline:  Goal status: MET  3.  Patient to be able to stand or walk for at least 15 min without leg pain  Baseline:  Goal status: IN  PROGRESS   LONG TERM GOALS: Target date: 04/08/2022  Patient to report pain no greater than 2/10  Baseline:  Goal status: IN PROGRESS  2.  Patient to be independent with advanced HEP  Baseline:  Goal status: IN PROGRESS  3.  Patient to be able to stand or walk for at least 30 min without pain Baseline:  Goal status: IN PROGRESS  4.  FOTO to be 71%. Baseline: 57% Goal status: IN PROGRESS  5.  5 times sit to stand to improve by 2 seconds Baseline: 14.6 sec on 02/21/22 Goal status: IN PROGRESS  6.  TUG test to improve by 2 seconds Baseline:  9.5 sec on 02/21/22 Goal status: IN PROGRESS   PLAN: PT FREQUENCY: 2x/week  PT DURATION: 8 weeks  PLANNED INTERVENTIONS: Therapeutic exercises, Therapeutic activity, Neuromuscular re-education, Balance training, Gait training, Patient/Family education, Self Care, Joint mobilization, Stair training, DME instructions, Aquatic Therapy, Dry Needling, Electrical stimulation, Spinal mobilization, Cryotherapy, Moist heat, Taping, Traction, Ultrasound, Ionotophoresis 78m/ml Dexamethasone, Manual  therapy, and Re-evaluation.  PLAN FOR NEXT SESSION: Continue reviewing exercises to insure good technique.     Anderson Malta B. Asser Lucena, PT 03/20/22 11:51 AM   [facility] [address], [place], [place] [address] Phone # [phone] Fax [phone]

## 2022-03-25 ENCOUNTER — Ambulatory Visit: Payer: BC Managed Care – PPO

## 2022-03-25 DIAGNOSIS — M5459 Other low back pain: Secondary | ICD-10-CM

## 2022-03-25 DIAGNOSIS — M6281 Muscle weakness (generalized): Secondary | ICD-10-CM

## 2022-03-25 DIAGNOSIS — R293 Abnormal posture: Secondary | ICD-10-CM

## 2022-03-25 DIAGNOSIS — R262 Difficulty in walking, not elsewhere classified: Secondary | ICD-10-CM

## 2022-03-25 DIAGNOSIS — R252 Cramp and spasm: Secondary | ICD-10-CM

## 2022-03-25 NOTE — Therapy (Signed)
OUTPATIENT PHYSICAL THERAPY TREATMENT NOTE   Patient Name: [name] MRN: [id number] DOB:June 03, 1960, 61 y.o., female Today's Date: 03/25/2022   PT End of Session - 03/25/22 0940     Visit Number 8    Date for PT Re-Evaluation 04/08/22    Authorization Type [employer]    PT Start Time 0935    PT Stop Time 1015    PT Time Calculation (min) 40 min    Activity Tolerance Patient tolerated treatment well    Behavior During Therapy WFL for tasks assessed/performed              Past Medical History:  Diagnosis Date   Adjustment disorder with anxiety    B12 deficiency due to diet    is a vegetarian   Chest pain, unspecified    Chronic anticoagulation    Fatty liver    Per patient   GERD (gastroesophageal reflux disease)    Headache(784.0)    migraines and head pain   Hx of varicella    As Child   Iron deficiency anemia, unspecified    Otalgia, unspecified    Other and unspecified hyperlipidemia    Palpitations    Personal history of venous thrombosis and embolism    Recurrent UTI    Rheumatoid arthritis(714.0)    deveshewar   Unspecified vitamin D deficiency    UTI (lower urinary tract infection) 11/28/2011   citrobacter      Past Surgical History:  Procedure Laterality Date   ORIF ULNAR FRACTURE     TYMPANOSTOMY TUBE PLACEMENT  2009   Right   Patient Active Problem List   Diagnosis Date Noted   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[place]) 01/06/2018   Long term (current) use of anticoagulants 02/11/2017   Sicca syndrome ([place]) 12/22/2016   ANA positive 12/22/2016   Raised intraocular pressure of both eyes 12/22/2016   Migraine 03/31/2016   Anxiety 03/31/2016   High risk medication use 03/31/2016   Primary osteoarthritis of both feet 03/31/2016   Primary osteoarthritis of both knees 03/31/2016   Stiffness of left hand joint 11/27/2014   Elevated IOP 06/09/2014   Long term current use of systemic steroids 06/09/2014   Medically complex patient 06/09/2014    Current use of steroid medication 01/09/2014   Xerosis of skin 12/07/2013   Encounter for therapeutic drug monitoring 06/09/2013   B12 deficiency 05/31/2013   Iron deficiency 05/31/2013   Visit for preventive health examination 05/25/2012   Mammogram abnormal 04/12/2012   Iliac crest  pain 01/23/2012   Vaginal atrophy 01/23/2012   History of recurrent UTI (urinary tract infection) 01/23/2012   Osteoporosis 04/29/2011   Nonspecific abnormal results of liver function study 12/08/2010   Chronic anticoagulation    Anticoagulant long-term use 07/15/2010   Anticardiolipin antibody positive 07/15/2010   Chronic cough 03/19/2009   CHEST PAIN-UNSPECIFIED 08/26/2008   PALPITATIONS, RECURRENT 07/24/2008   Vitamin D deficiency 05/30/2008   HYPERLIPIDEMIA 05/30/2008   IRON DEFICIENCY 05/30/2008   Adjustment disorder with anxiety 05/30/2008   Rheumatoid arthritis ([place]) 05/30/2008   PULMONARY EMBOLISM, HX OF 05/30/2008    PCP: Burnis Medin, MD   REFERRING PROVIDER: Bo Merino, MD   REFERRING DIAG: M51.36 (ICD-10-CM) - DDD (degenerative disc disease), lumbar M54.50 (ICD-10-CM) - Acute midline low back pain without sciatica   Rationale for Evaluation and Treatment Rehabilitation  THERAPY DIAG:  Other low back pain  Abnormal posture  Cramp and spasm  Muscle weakness (generalized)  Difficulty in walking, not elsewhere classified  ONSET DATE: 01/30/2022   SUBJECTIVE:                                                                                                                                                                                           SUBJECTIVE STATEMENT: Pt reports some discomfort with the ball pass exercise but overall doing well today.    PERTINENT HISTORY:  Most recent MD note:  [name] is a 61 y.o. female with history of seronegative rheumatoid arthritis.  According the patient about 2 weeks ago she started having lower back pain which  gradually got worse.  She did not want to go to the emergency room as she was concerned about COVID-19 virus exposure.  She was seen by Dr.Xu on January 28, 2022.  She had x-rays of her lumbar spine which showed some degenerative changes.  She was offered a prednisone taper which she declined.  He also gave her prescription for Flexeril 5 mg which she has not started yet.  She states the Lidoderm patches were not covered by her insurance.  She continues to have lower back pain.  None of the other joints are painful or swollen.  She states that she had some discomfort in her left heel which comes and goes.  She has been taking methotrexate 8 tablets p.o. weekly and hydroxychloroquine 1 tablet p.o. twice daily Monday to Friday  PAIN:  Are you having pain? Yes: NPRS scale: 2/10 Pain location: low back Pain description: aching Aggravating factors: standing, sitting Relieving factors: heat prednisone dose pack   PRECAUTIONS: None  WEIGHT BEARING RESTRICTIONS No  FALLS:  Has patient fallen in last 6 months? No  LIVING ENVIRONMENT: Lives with: lives with their spouse Lives in: House/apartment Stairs: Yes: Internal: 12 steps; can reach both Has following equipment at home: None  OCCUPATION: Retired  PLOF: Independent, Independent with basic ADLs, Independent with household mobility without device, Independent with community mobility without device, Independent with homemaking with ambulation, Independent with gait, and Independent with transfers  PATIENT GOALS "I just want to be pain free"   OBJECTIVE:   DIAGNOSTIC FINDINGS:  Xray 01/28/22 Mild degenerative changes L5-S1.  Mild facet disease  PATIENT SURVEYS:  FOTO 57 goal is 41  SCREENING FOR RED FLAGS: Bowel or bladder incontinence: No Spinal tumors: No Cauda equina syndrome: No Compression fracture: No Abdominal aneurysm: No  COGNITION:  Overall cognitive status: Within functional limits for tasks  assessed     SENSATION: WFL  MUSCLE LENGTH: Hamstrings: Right 45 deg; Left 50 deg Thomas test: Right pos ; Left pos   POSTURE: increased lumbar lordosis   LUMBAR ROM:  Active  A/PROM  eval  Flexion WNL  Extension WNL  Right lateral flexion Fingertips to distal thigh  Left lateral flexion Fingertips to joint line  Right rotation WNL  Left rotation WNL   (Blank rows = not tested)  LOWER EXTREMITY ROM:     WFL  LOWER EXTREMITY MMT:    Generally 4-/5 bilateral LE's  LUMBAR SPECIAL TESTS:  Straight leg raise test: Negative, Slump test: Negative, Stork standing: not tested, and FABER test: Negative  FUNCTIONAL TESTS:   Eval: 5 times sit to stand: test next visit Timed up and go (TUG): test next visit  02/21/2022: 5 times sit to stand: 14.6 sec with minimal UE pushing up from thighs Timed up and go (TUG):  9.5 sec without assistive device 3 min walk test:  730 ft without assistive device  GAIT: Distance walked: 50 Assistive device utilized: None Level of assistance: Complete Independence Comments: antalgic, guarded    TODAY'S TREATMENT  Date: 03/25/22 Nustep level 4 x 6 min with PT present to discuss status PPT x 20 (needed minimal verbal and tactile cues for engaging TA) PPT with 90/90 heel tap x 20 (minimal verbal and tactile cues to maintain PPT) PPT with dying bug 2 x 10 (patient improving with sequencing) Lower trunk rotation 5x10 sec bilat Lengthy amount of time educating patient on anatomy of the spine, how degenerative disc and stenosis are affected by standing vs. Walking, proper progression of walking, rest times when she has to stand and cook.   Addaday to bilateral low back, S.I. area, piriformis area x 10 min   Date: 03/20/22 Nustep level 4 x 6 min with PT present to discuss status PPT x 20 (needed heavy verbal and tactile cues for engaging TA PPT with 90/90 heel tap x 20 (heavy verbal and tactile cues to maintain PPT) PPT with dying bug x 20  (patient improving with sequencing) Lower trunk rotation 5x10 sec bilat PPT with ball pass (patient needed as much as 15 min to get the correct technique on this exercise and lost the ability to recall how to obtain neutral pelvis/pelvic tilt)     Date: 02/28/22 Nustep level 4 x 6 min with PT present to discuss status PPT x 20 PPT with 90/90 heel tap x 20 PPT with dying bug x 20 with max cuing for technique and sequencing Lower trunk rotation 5x10 sec bilat Seated piriformis stretch 2x20 sec bilat Seated hamstring stretch 2x20 sec bilat 4 way pelvic tilt seated on dynadisc x20 each with cuing for technique Manual therapy using Addaday to lumbar multifidi, piriformis, hamstrings.  Pt verbalizes decreased decreased pain following    PATIENT EDUCATION:  Education details: Initiated HEP Person educated: Patient Education method: Consulting civil engineer, Media planner, Verbal cues, and Handouts Education comprehension: verbalized understanding, returned demonstration, and verbal cues required   HOME EXERCISE PROGRAM:  Access Code: [email or web address] URL: [email or web address] Date: 03/20/2022 Prepared by: Candyce Churn  Exercises - Supine Hamstring Stretch with Strap  - 1 x daily - 7 x weekly - 1 sets - 3 reps - 30 sec hold - Supine ITB Stretch with Strap  - 1 x daily - 7 x weekly - 1 sets - 3 reps - 30 sec hold - Supine Piriformis Stretch Pulling Heel to Hip  - 1 x daily - 7 x weekly - 1 sets - 3 reps - 30 sec hold - Supine Posterior Pelvic Tilt  - 1 x daily - 7 x weekly - 1 sets - 20 reps - Standing  Sports administrator with Table and Chair Support  - 1 x daily - 7 x weekly - 1 sets - 3 reps - 30 sec hold - Seated Piriformis Stretch  - 1 x daily - 7 x weekly - 1 sets - 3 reps - 20 sec hold - Seated Hamstring Stretch  - 2 x daily - 7 x weekly - 1 sets - 3 reps - 20 hold - Sidelying Open Book Thoracic Lumbar Rotation and Extension  - 2 x daily - 7 x weekly - 1 sets - 10 reps - Clamshell  - 1 x  daily - 7 x weekly - 2 sets - 10 reps - Prone Quadriceps Stretch with Strap  - 1 x daily - 7 x weekly - 1 sets - 3 reps - 30 sec hold - Supine 90/90 Alternating Heel Touches with Posterior Pelvic Tilt  - 1 x daily - 7 x weekly - 2 sets - 10 reps - Supine Dead Bug with Leg Extension  - 1 x daily - 7 x weekly - 2 sets - 10 reps - Reverse Crunch with Mini Swiss Ball  - 1 x daily - 7 x weekly - 2 sets - 10 repsAccess Code: [email or web address] URL: [email or web address] Date: 02/28/2022 Prepared by: Shelby Dubin Menke    ASSESSMENT:  CLINICAL IMPRESSION: Ms Buscemi did significantly better today with technique on all exercises and needed very little verbal cues to slow down.  She does still have a lot of questions about why she hurts in different positions and what to do after she exercises with regard to soreness, how far to walk, how long to stand, etc.  We take a significant amount of time doing education but she does seem to respond to this.  Her language barrier may sometimes  interfere with her understanding and therefore she requires some extra time on education.  Overall, she did very well today and seems to be gaining better understanding of her HEP and why she is doing the particular exercises.     Pt continues to require skilled PT to progress towards goal related activities.   OBJECTIVE IMPAIRMENTS Abnormal gait, decreased mobility, difficulty walking, decreased ROM, decreased strength, increased fascial restrictions, increased muscle spasms, impaired flexibility, postural dysfunction, obesity, and pain.   ACTIVITY LIMITATIONS carrying, lifting, bending, sitting, standing, squatting, sleeping, stairs, transfers, bed mobility, and dressing  PARTICIPATION LIMITATIONS: meal prep, cleaning, laundry, driving, shopping, community activity, and yard work  PERSONAL FACTORS Behavior pattern, Fitness, Past/current experiences, Time since onset of injury/illness/exacerbation, and 1-2 comorbidities:  chronic pain, RA  are also affecting patient's functional outcome.   REHAB POTENTIAL: Good  CLINICAL DECISION MAKING: Evolving/moderate complexity  EVALUATION COMPLEXITY: Moderate   GOALS: Goals reviewed with patient? Yes  SHORT TERM GOALS: Target date: 03/11/2022  Pain report to be no greater than 4/10  Baseline: Goal status: IN PROGRESS  2.  Patient will be independent with initial HEP  Baseline:  Goal status: MET  3.  Patient to be able to stand or walk for at least 15 min without leg pain  Baseline:  Goal status: IN PROGRESS   LONG TERM GOALS: Target date: 04/08/2022  Patient to report pain no greater than 2/10  Baseline:  Goal status: IN PROGRESS  2.  Patient to be independent with advanced HEP  Baseline:  Goal status: IN PROGRESS  3.  Patient to be able to stand or walk for at least 30 min without pain Baseline:  Goal status: IN PROGRESS  4.  FOTO  to be 71%. Baseline: 57% Goal status: IN PROGRESS  5.  5 times sit to stand to improve by 2 seconds Baseline: 14.6 sec on 02/21/22 Goal status: IN PROGRESS  6.  TUG test to improve by 2 seconds Baseline:  9.5 sec on 02/21/22 Goal status: IN PROGRESS   PLAN: PT FREQUENCY: 2x/week  PT DURATION: 8 weeks  PLANNED INTERVENTIONS: Therapeutic exercises, Therapeutic activity, Neuromuscular re-education, Balance training, Gait training, Patient/Family education, Self Care, Joint mobilization, Stair training, DME instructions, Aquatic Therapy, Dry Needling, Electrical stimulation, Spinal mobilization, Cryotherapy, Moist heat, Taping, Traction, Ultrasound, Ionotophoresis 64m/ml Dexamethasone, Manual therapy, and Re-evaluation.  PLAN FOR NEXT SESSION: Continue reviewing exercises to insure good technique.     JAnderson MaltaB. , PT 03/25/22 10:20 AM   [facility] [address] [place]edsburg[place]lade[place] [address]Phone # [phone] [phone]

## 2022-03-26 ENCOUNTER — Ambulatory Visit: Payer: BC Managed Care – PPO | Admitting: Psychology

## 2022-03-26 ENCOUNTER — Ambulatory Visit (INDEPENDENT_AMBULATORY_CARE_PROVIDER_SITE_OTHER): Payer: BC Managed Care – PPO

## 2022-03-26 DIAGNOSIS — F4322 Adjustment disorder with anxiety: Secondary | ICD-10-CM | POA: Diagnosis not present

## 2022-03-26 DIAGNOSIS — Z7901 Long term (current) use of anticoagulants: Secondary | ICD-10-CM

## 2022-03-26 LAB — POCT INR: INR: 1.6 — AB (ref 2.0–3.0)

## 2022-03-26 NOTE — Progress Notes (Signed)
Continue  5 mg (2 tablets) daily except take 2.5 mg (1 tablet) on Sundays and take 3.75 mg (1 1/2 tablet) on Thursdays. Recheck on 04/28/22 at 1:30.

## 2022-03-26 NOTE — Progress Notes (Signed)
03/26/2022  Treatrment Plan:  Diagnosis F41.1 (Generalized anxiety disorder) [n/a]  Z62.820 (Parent child relationship problem) [n/a]  Symptoms Excessive and/or unrealistic worry that is difficult to control occurring more days than not for at least 6 months about a number of events or activities. (Status: maintained) -- No Description Entered  Regularly overindulges their child's wishes and demands. (Status: maintained) -- No Description Entered  Medication Status compliance  Safety none  If Suicidal or Homicidal State Action Taken: unspecified  Current Risk: low Medications unspecified Objectives Related Problem: Achieve a level of competent, effective parenting. Description: Increase the gradual letting go of their adolescent in constructive, affirmative ways. Target Date: 2022-05-14 Frequency: Daily Modality: individual Progress: 80%  Related Problem: Achieve a level of competent, effective parenting. Description: Identify unresolved childhood issues that affect parenting and work toward their resolution. Target Date: 2022-05-14 Frequency: Daily Modality: individual Progress: 50%  Related Problem: Achieve a level of competent, effective parenting. Description: Freely express feelings of frustration, helplessness, and inadequacy that each experiences in the parenting role. Target Date: 2022-05-14 Frequency: Daily Modality: individual Progress: 80%  Related Problem: Resolve the core conflict that is the source of anxiety. Description: Learn and implement problem-solving strategies for realistically addressing worries. Target Date: 2022-05-14 Frequency: Daily Modality: individual Progress: 70%  Related Problem: Resolve the core conflict that is the source of anxiety. Description: Learn and implement calming skills to reduce overall anxiety and manage anxiety symptoms. Target Date: 2022-05-14 Frequency: Daily Modality:  individual Progress: 70%  Related Problem: Resolve the core conflict that is the source of anxiety. Description: Describe situations, thoughts, feelings, and actions associated with anxieties and worries, their impact on functioning, and attempts to resolve them. Target Date: 2022-05-14 Frequency: Daily Modality: individual Progress: 60%  Client Response full compliance  Service Location Location, [address] [place], [place] [address]  Service Code cpt [phone]  Behavioral activation plan  Facilitate problem solving  Identify automatic thoughts  Emotion regulation skills  Provide education, information  Self care activities  Relaxation training  Lifestyle change (exercise, nutrition)  Self-monitoring  Validate/empathize  Session notes: F 41.1  Goals: She has chronic anxieties, mostly related to health concerns and her son's well-being. Would like to develop strategies to manage symptoms of anxiety. Needs to understand enmeshment and learn to define and manage appropriate family boundaries. Target date is 12-23. Patient has realized significant improvement in recognizing and establishing appropriate boundaries. Anxiety about health and family matters persist and she desires to continue treatment in an effort to manage and reduce these feelings. Target date is 12-23.  Meds: Xanax (.25 mg)  Patient agrees to a video session. She is at home and I am in my home office.  Dotty and [name]: Katlyne says she has significant concerns that her son will go back to employer where he was so unhappy.  Sorousch says [employer] increased the offer to him for a job, but it is still too low. He says he has been having some medical concerns with his jaw. He grinds his teeth and thinks it may be related. Has talked to psychiatrist and found out that the SSRI may be a contributing factor. He is otherwise good with the medicine. Will try to rectify the problem without changing the medication. He  acknowledges that he considers everything in life is a burden. Anything that takes any time at all is a burden to him that  he avoids. Inette is very anxious/worried that Sorousch is making poor decisions and he will limit his opportunities in life.                Marcelina Morel, PhD 2:10p-3:00p 50 min

## 2022-03-26 NOTE — Patient Instructions (Signed)
Continue  5 mg (2 tablets) daily except take 2.5 mg (1 tablet) on Sundays and take 3.75 mg (1 1/2 tablet) on Thursdays. Recheck on 04/28/22 at 1:30.

## 2022-03-27 ENCOUNTER — Ambulatory Visit: Payer: BC Managed Care – PPO

## 2022-03-27 DIAGNOSIS — R262 Difficulty in walking, not elsewhere classified: Secondary | ICD-10-CM

## 2022-03-27 DIAGNOSIS — R252 Cramp and spasm: Secondary | ICD-10-CM

## 2022-03-27 DIAGNOSIS — M6281 Muscle weakness (generalized): Secondary | ICD-10-CM

## 2022-03-27 DIAGNOSIS — R293 Abnormal posture: Secondary | ICD-10-CM

## 2022-03-27 DIAGNOSIS — M5459 Other low back pain: Secondary | ICD-10-CM | POA: Diagnosis not present

## 2022-03-27 NOTE — Therapy (Signed)
OUTPATIENT PHYSICAL THERAPY TREATMENT NOTE   Patient Name: [name] MRN: [id number] DOB:1960/11/11, 61 y.o., female Today's Date: 03/27/2022   PT End of Session - 03/27/22 0939     Visit Number 9    Date for PT Re-Evaluation 04/08/22    Authorization Type [employer]    PT Start Time 0935    PT Stop Time 1013    PT Time Calculation (min) 38 min    Activity Tolerance Patient tolerated treatment well    Behavior During Therapy WFL for tasks assessed/performed              Past Medical History:  Diagnosis Date   Adjustment disorder with anxiety    B12 deficiency due to diet    is a vegetarian   Chest pain, unspecified    Chronic anticoagulation    Fatty liver    Per patient   GERD (gastroesophageal reflux disease)    Headache(784.0)    migraines and head pain   Hx of varicella    As Child   Iron deficiency anemia, unspecified    Otalgia, unspecified    Other and unspecified hyperlipidemia    Palpitations    Personal history of venous thrombosis and embolism    Recurrent UTI    Rheumatoid arthritis(714.0)    deveshewar   Unspecified vitamin D deficiency    UTI (lower urinary tract infection) 11/28/2011   citrobacter      Past Surgical History:  Procedure Laterality Date   ORIF ULNAR FRACTURE     TYMPANOSTOMY TUBE PLACEMENT  2009   Right   Patient Active Problem List   Diagnosis Date Noted   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[place]) 01/06/2018   Long term (current) use of anticoagulants 02/11/2017   Sicca syndrome ([place]) 12/22/2016   ANA positive 12/22/2016   Raised intraocular pressure of both eyes 12/22/2016   Migraine 03/31/2016   Anxiety 03/31/2016   High risk medication use 03/31/2016   Primary osteoarthritis of both feet 03/31/2016   Primary osteoarthritis of both knees 03/31/2016   Stiffness of left hand joint 11/27/2014   Elevated IOP 06/09/2014   Long term current use of systemic steroids 06/09/2014   Medically complex patient 06/09/2014    Current use of steroid medication 01/09/2014   Xerosis of skin 12/07/2013   Encounter for therapeutic drug monitoring 06/09/2013   B12 deficiency 05/31/2013   Iron deficiency 05/31/2013   Visit for preventive health examination 05/25/2012   Mammogram abnormal 04/12/2012   Iliac crest  pain 01/23/2012   Vaginal atrophy 01/23/2012   History of recurrent UTI (urinary tract infection) 01/23/2012   Osteoporosis 04/29/2011   Nonspecific abnormal results of liver function study 12/08/2010   Chronic anticoagulation    Anticoagulant long-term use 07/15/2010   Anticardiolipin antibody positive 07/15/2010   Chronic cough 03/19/2009   CHEST PAIN-UNSPECIFIED 08/26/2008   PALPITATIONS, RECURRENT 07/24/2008   Vitamin D deficiency 05/30/2008   HYPERLIPIDEMIA 05/30/2008   IRON DEFICIENCY 05/30/2008   Adjustment disorder with anxiety 05/30/2008   Rheumatoid arthritis ([place]) 05/30/2008   PULMONARY EMBOLISM, HX OF 05/30/2008    PCP: Krystal Medin, MD   REFERRING PROVIDER: Bo Merino, MD   REFERRING DIAG: M51.36 (ICD-10-CM) - DDD (degenerative disc disease), lumbar M54.50 (ICD-10-CM) - Acute midline low back pain without sciatica   Rationale for Evaluation and Treatment Rehabilitation  THERAPY DIAG:  Other low back pain  Abnormal posture  Cramp and spasm  Muscle weakness (generalized)  Difficulty in walking, not elsewhere classified  ONSET DATE: 01/30/2022   SUBJECTIVE:                                                                                                                                                                                           SUBJECTIVE STATEMENT: Pt reports she is about 10% better than initial eval.     PERTINENT HISTORY:  Most recent MD note:  [name] is a 61 y.o. female with history of seronegative rheumatoid arthritis.  According the patient about 2 weeks ago she started having lower back pain which gradually got worse.  She did  not want to go to the emergency room as she was concerned about COVID-19 virus exposure.  She was seen by [name] on January 28, 2022.  She had x-rays of her lumbar spine which showed some degenerative changes.  She was offered a prednisone taper which she declined.  He also gave her prescription for Flexeril 5 mg which she has not started yet.  She states the Lidoderm patches were not covered by her insurance.  She continues to have lower back pain.  None of the other joints are painful or swollen.  She states that she had some discomfort in her left heel which comes and goes.  She has been taking methotrexate 8 tablets p.o. weekly and hydroxychloroquine 1 tablet p.o. twice daily Monday to Friday  PAIN:  Are you having pain? Yes: NPRS scale: 2/10 Pain location: low back Pain description: aching Aggravating factors: standing, sitting Relieving factors: heat prednisone dose pack   PRECAUTIONS: None  WEIGHT BEARING RESTRICTIONS No  FALLS:  Has patient fallen in last 6 months? No  LIVING ENVIRONMENT: Lives with: lives with their spouse Lives in: House/apartment Stairs: Yes: Internal: 12 steps; can reach both Has following equipment at home: None  OCCUPATION: Retired  PLOF: Independent, Independent with basic ADLs, Independent with household mobility without device, Independent with community mobility without device, Independent with homemaking with ambulation, Independent with gait, and Independent with transfers  PATIENT GOALS "I just want to be pain free"   OBJECTIVE:   DIAGNOSTIC FINDINGS:  Xray 01/28/22 Mild degenerative changes L5-S1.  Mild facet disease  PATIENT SURVEYS:  FOTO 57 goal is 53  SCREENING FOR RED FLAGS: Bowel or bladder incontinence: No Spinal tumors: No Cauda equina syndrome: No Compression fracture: No Abdominal aneurysm: No  COGNITION:  Overall cognitive status: Within functional limits for tasks assessed     SENSATION: WFL  MUSCLE  LENGTH: Hamstrings: Right 45 deg; Left 50 deg Thomas test: Right pos ; Left pos   POSTURE: increased lumbar lordosis   LUMBAR ROM:   Active  A/PROM  eval  Flexion WNL  Extension WNL  Right lateral flexion Fingertips to distal thigh  Left lateral flexion Fingertips to joint line  Right rotation WNL  Left rotation WNL   (Blank rows = not tested)  LOWER EXTREMITY ROM:     WFL  LOWER EXTREMITY MMT:    Generally 4-/5 bilateral LE's  LUMBAR SPECIAL TESTS:  Straight leg raise test: Negative, Slump test: Negative, Stork standing: not tested, and FABER test: Negative  FUNCTIONAL TESTS:   Eval: 5 times sit to stand: test next visit Timed up and go (TUG): test next visit  02/21/2022: 5 times sit to stand: 14.6 sec with minimal UE pushing up from thighs Timed up and go (TUG):  9.5 sec without assistive device 3 min walk test:  730 ft without assistive device  GAIT: Distance walked: 50 Assistive device utilized: None Level of assistance: Complete Independence Comments: antalgic, guarded    TODAY'S TREATMENT  Date: 03/27/22 Supine lower trunk rotation x 20 Supine hamstring stretch 2 x 30 sec Supine IT band stretch 2  x 30 sec Hook lying piriformis stretch 2 x 30 sec each PPT x 20 (needed increased verbal and tactile cues for engaging TA today) PPT with 90/90 heel tap x 20 (minimal verbal and tactile cues to maintain PPT) PPT with dying bug 2 x 10 (patient doing well with sequencing) Lower trunk rotation 5x10 sec bilat Spouse in attendance today to learn how to use the thera-gun and know what to order  Addaday to bilateral low back, S.I. area, piriformis area x 10 min instructing spouse in precautions regarding avoiding spine or bony prominences due to her osteoporosis.   Date: 03/25/22 Nustep level 4 x 6 min with PT present to discuss status PPT x 20 (needed minimal verbal and tactile cues for engaging TA) PPT with 90/90 heel tap x 20 (minimal verbal and tactile cues  to maintain PPT) PPT with dying bug 2 x 10 (patient improving with sequencing) Lower trunk rotation 5x10 sec bilat Lengthy amount of time educating patient on anatomy of the spine, how degenerative disc and stenosis are affected by standing vs. Walking, proper progression of walking, rest times when she has to stand and cook.   Addaday to bilateral low back, S.I. area, piriformis area x 10 min   Date: 03/20/22 Nustep level 4 x 6 min with PT present to discuss status PPT x 20 (needed heavy verbal and tactile cues for engaging TA PPT with 90/90 heel tap x 20 (heavy verbal and tactile cues to maintain PPT) PPT with dying bug x 20 (patient improving with sequencing) Lower trunk rotation 5x10 sec bilat PPT with ball pass (patient needed as much as 15 min to get the correct technique on this exercise and lost the ability to recall how to obtain neutral pelvis/pelvic tilt)     PATIENT EDUCATION:  Education details: Initiated HEP Person educated: Patient Education method: Consulting civil engineer, Media planner, Verbal cues, and Handouts Education comprehension: verbalized understanding, returned demonstration, and verbal cues required   HOME EXERCISE PROGRAM:  Access Code: [email or web address] URL: [email or web address] Date: 03/20/2022 Prepared by: Candyce Churn  Exercises - Supine Hamstring Stretch with Strap  - 1 x daily - 7 x weekly - 1 sets - 3 reps - 30 sec hold - Supine ITB Stretch with Strap  - 1 x daily - 7 x weekly - 1 sets - 3 reps - 30 sec hold - Supine Piriformis Stretch Pulling Heel to Hip  - 1 x daily -  7 x weekly - 1 sets - 3 reps - 30 sec hold - Supine Posterior Pelvic Tilt  - 1 x daily - 7 x weekly - 1 sets - 20 reps - Standing Quad Stretch with Table and Chair Support  - 1 x daily - 7 x weekly - 1 sets - 3 reps - 30 sec hold - Seated Piriformis Stretch  - 1 x daily - 7 x weekly - 1 sets - 3 reps - 20 sec hold - Seated Hamstring Stretch  - 2 x daily - 7 x weekly - 1 sets - 3  reps - 20 hold - Sidelying Open Book Thoracic Lumbar Rotation and Extension  - 2 x daily - 7 x weekly - 1 sets - 10 reps - Clamshell  - 1 x daily - 7 x weekly - 2 sets - 10 reps - Prone Quadriceps Stretch with Strap  - 1 x daily - 7 x weekly - 1 sets - 3 reps - 30 sec hold - Supine 90/90 Alternating Heel Touches with Posterior Pelvic Tilt  - 1 x daily - 7 x weekly - 2 sets - 10 reps - Supine Dead Bug with Leg Extension  - 1 x daily - 7 x weekly - 2 sets - 10 reps - Reverse Crunch with Mini Swiss Ball  - 1 x daily - 7 x weekly - 2 sets - 10 repsAccess Code: [email or web address] URL: [email or web address] Date: 02/28/2022 Prepared by: Shelby Dubin Menke    ASSESSMENT:  CLINICAL IMPRESSION: Ms Bost is progressing but slowly.  She needs reassurance and review of  HEP frequently.  Spouse did very well with training to use theragun.  She should continue to improve.    Pt continues to require skilled PT to progress towards goal related activities.   OBJECTIVE IMPAIRMENTS Abnormal gait, decreased mobility, difficulty walking, decreased ROM, decreased strength, increased fascial restrictions, increased muscle spasms, impaired flexibility, postural dysfunction, obesity, and pain.   ACTIVITY LIMITATIONS carrying, lifting, bending, sitting, standing, squatting, sleeping, stairs, transfers, bed mobility, and dressing  PARTICIPATION LIMITATIONS: meal prep, cleaning, laundry, driving, shopping, community activity, and yard work  PERSONAL FACTORS Behavior pattern, Fitness, Past/current experiences, Time since onset of injury/illness/exacerbation, and 1-2 comorbidities: chronic pain, RA  are also affecting patient's functional outcome.   REHAB POTENTIAL: Good  CLINICAL DECISION MAKING: Evolving/moderate complexity  EVALUATION COMPLEXITY: Moderate   GOALS: Goals reviewed with patient? Yes  SHORT TERM GOALS: Target date: 03/11/2022  Pain report to be no greater than 4/10  Baseline: Goal  status: IN PROGRESS  2.  Patient will be independent with initial HEP  Baseline:  Goal status: MET  3.  Patient to be able to stand or walk for at least 15 min without leg pain  Baseline:  Goal status: IN PROGRESS   LONG TERM GOALS: Target date: 04/08/2022  Patient to report pain no greater than 2/10  Baseline:  Goal status: IN PROGRESS  2.  Patient to be independent with advanced HEP  Baseline:  Goal status: IN PROGRESS  3.  Patient to be able to stand or walk for at least 30 min without pain Baseline:  Goal status: IN PROGRESS  4.  FOTO to be 71%. Baseline: 57% Goal status: IN PROGRESS  5.  5 times sit to stand to improve by 2 seconds Baseline: 14.6 sec on 02/21/22 Goal status: IN PROGRESS  6.  TUG test to improve by 2 seconds Baseline:  9.5 sec on 02/21/22 Goal status: IN PROGRESS  PLAN: PT FREQUENCY: 2x/week  PT DURATION: 8 weeks  PLANNED INTERVENTIONS: Therapeutic exercises, Therapeutic activity, Neuromuscular re-education, Balance training, Gait training, Patient/Family education, Self Care, Joint mobilization, Stair training, DME instructions, Aquatic Therapy, Dry Needling, Electrical stimulation, Spinal mobilization, Cryotherapy, Moist heat, Taping, Traction, Ultrasound, Ionotophoresis 27m/ml Dexamethasone, Manual therapy, and Re-evaluation.  PLAN FOR NEXT SESSION: Continue reviewing exercises to insure good technique.     JAnderson MaltaB. Marialuiza Car, PT 03/27/22 10:15 AM   BMonte Vista[address] SWashington ParkGSemmes [place] [address]Phone # [phone] [phone]

## 2022-03-31 ENCOUNTER — Encounter: Payer: Self-pay | Admitting: Rheumatology

## 2022-03-31 ENCOUNTER — Encounter: Payer: Self-pay | Admitting: Internal Medicine

## 2022-04-01 ENCOUNTER — Ambulatory Visit: Payer: BC Managed Care – PPO

## 2022-04-01 DIAGNOSIS — M5459 Other low back pain: Secondary | ICD-10-CM | POA: Diagnosis not present

## 2022-04-01 DIAGNOSIS — R252 Cramp and spasm: Secondary | ICD-10-CM

## 2022-04-01 DIAGNOSIS — R293 Abnormal posture: Secondary | ICD-10-CM

## 2022-04-01 DIAGNOSIS — M6281 Muscle weakness (generalized): Secondary | ICD-10-CM

## 2022-04-01 DIAGNOSIS — R262 Difficulty in walking, not elsewhere classified: Secondary | ICD-10-CM

## 2022-04-01 NOTE — Therapy (Signed)
OUTPATIENT PHYSICAL THERAPY TREATMENT NOTE   [name] Name: [name] MRN: [id number] DOB:January 04, 1961, 61 y.o., female Today's Date: 04/01/2022   [name] End of Session - 04/01/22 0932     Visit Number 10    Date for [name] Re-Evaluation 04/08/22    Authorization Type [employer]    [name] Start Time 0933    [name] Stop Time 7829    [name] Time Calculation (min) 42 min    Activity Tolerance [name] tolerated treatment well    Behavior During Therapy WFL for tasks assessed/performed              Past Medical History:  Diagnosis Date   Adjustment disorder with anxiety    B12 deficiency due to diet    is a vegetarian   Chest pain, unspecified    Chronic anticoagulation    Fatty liver    Per [name]   GERD (gastroesophageal reflux disease)    Headache(784.0)    migraines and head pain   Hx of varicella    As Child   Iron deficiency anemia, unspecified    Otalgia, unspecified    Other and unspecified hyperlipidemia    Palpitations    Personal history of venous thrombosis and embolism    Recurrent UTI    Rheumatoid arthritis(714.0)    deveshewar   Unspecified vitamin D deficiency    UTI (lower urinary tract infection) 11/28/2011   citrobacter      Past Surgical History:  Procedure Laterality Date   ORIF ULNAR FRACTURE     TYMPANOSTOMY TUBE PLACEMENT  2009   Right   [name] Active Problem List   Diagnosis Date Noted   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[place]) 01/06/2018   Long term (current) use of anticoagulants 02/11/2017   Sicca syndrome ([place]) 12/22/2016   ANA positive 12/22/2016   Raised intraocular pressure of both eyes 12/22/2016   Migraine 03/31/2016   Anxiety 03/31/2016   High risk medication use 03/31/2016   Primary osteoarthritis of both feet 03/31/2016   Primary osteoarthritis of both knees 03/31/2016   Stiffness of left hand joint 11/27/2014   Elevated IOP 06/09/2014   Long term current use of systemic steroids 06/09/2014   Medically complex [name]  06/09/2014   Current use of steroid medication 01/09/2014   Xerosis of skin 12/07/2013   Encounter for therapeutic drug monitoring 06/09/2013   B12 deficiency 05/31/2013   Iron deficiency 05/31/2013   Visit for preventive health examination 05/25/2012   Mammogram abnormal 04/12/2012   Iliac crest  pain 01/23/2012   Vaginal atrophy 01/23/2012   History of recurrent UTI (urinary tract infection) 01/23/2012   Osteoporosis 04/29/2011   Nonspecific abnormal results of liver function study 12/08/2010   Chronic anticoagulation    Anticoagulant long-term use 07/15/2010   Anticardiolipin antibody positive 07/15/2010   Chronic cough 03/19/2009   CHEST PAIN-UNSPECIFIED 08/26/2008   PALPITATIONS, RECURRENT 07/24/2008   Vitamin D deficiency 05/30/2008   HYPERLIPIDEMIA 05/30/2008   IRON DEFICIENCY 05/30/2008   Adjustment disorder with anxiety 05/30/2008   Rheumatoid arthritis ([place]) 05/30/2008   PULMONARY EMBOLISM, HX OF 05/30/2008    PCP: Burnis Medin, MD   REFERRING PROVIDER: Bo Merino, MD   REFERRING DIAG: M51.36 (ICD-10-CM) - DDD (degenerative disc disease), lumbar M54.50 (ICD-10-CM) - Acute midline low back pain without sciatica   Rationale for Evaluation and Treatment Rehabilitation  THERAPY DIAG:  Other low back pain  Abnormal posture  Cramp and spasm  Muscle weakness (generalized)  Difficulty in walking, not elsewhere classified  ONSET DATE: 01/30/2022   SUBJECTIVE:                                                                                                                                                                                           SUBJECTIVE STATEMENT: [name] reports she is doing pretty good.  She reports her pain at 2/10 today.  She does admit that she had difficulty trying to stay asleep last night due to some back pain and had to get up and put heat on it.  After that she slept the rest of the night and woke up feeling good.       PERTINENT  HISTORY:  Most recent MD note:  [name] is a 61 y.o. female with history of seronegative rheumatoid arthritis.  According the [name] about 2 weeks ago she started having lower back pain which gradually got worse.  She did not want to go to the emergency room as she was concerned about COVID-19 virus exposure.  She was seen by Dr.Xu on January 28, 2022.  She had x-rays of her lumbar spine which showed some degenerative changes.  She was offered a prednisone taper which she declined.  He also gave her prescription for Flexeril 5 mg which she has not started yet.  She states the Lidoderm patches were not covered by her insurance.  She continues to have lower back pain.  None of the other joints are painful or swollen.  She states that she had some discomfort in her left heel which comes and goes.  She has been taking methotrexate 8 tablets p.o. weekly and hydroxychloroquine 1 tablet p.o. twice daily Monday to Friday  PAIN:  Are you having pain? Yes: NPRS scale: 2/10 Pain location: low back Pain description: aching Aggravating factors: standing, sitting Relieving factors: heat prednisone dose pack   PRECAUTIONS: None  WEIGHT BEARING RESTRICTIONS No  FALLS:  Has [name] fallen in last 6 months? No  LIVING ENVIRONMENT: Lives with: lives with their spouse Lives in: House/apartment Stairs: Yes: Internal: 12 steps; can reach both Has following equipment at home: None  OCCUPATION: Retired  PLOF: Independent, Independent with basic ADLs, Independent with household mobility without device, Independent with community mobility without device, Independent with homemaking with ambulation, Independent with gait, and Independent with transfers  [name] GOALS "I just want to be pain free"   OBJECTIVE:   DIAGNOSTIC FINDINGS:  Xray 01/28/22 Mild degenerative changes L5-S1.  Mild facet disease  [name] SURVEYS:  FOTO 57 goal is 33  SCREENING FOR RED FLAGS: Bowel or bladder  incontinence: No Spinal tumors: No Cauda equina syndrome: No Compression fracture: No Abdominal  aneurysm: No  COGNITION:  Overall cognitive status: Within functional limits for tasks assessed     SENSATION: WFL  MUSCLE LENGTH: Hamstrings: Right 45 deg; Left 50 deg Thomas test: Right pos ; Left pos   POSTURE: increased lumbar lordosis   LUMBAR ROM:   Active  A/PROM  eval  Flexion WNL  Extension WNL  Right lateral flexion Fingertips to distal thigh  Left lateral flexion Fingertips to joint line  Right rotation WNL  Left rotation WNL   (Blank rows = not tested)  LOWER EXTREMITY ROM:     WFL  LOWER EXTREMITY MMT:    Generally 4-/5 bilateral LE's  LUMBAR SPECIAL TESTS:  Straight leg raise test: Negative, Slump test: Negative, Stork standing: not tested, and FABER test: Negative  FUNCTIONAL TESTS:   Eval: 5 times sit to stand: test next visit Timed up and go (TUG): test next visit  02/21/2022: 5 times sit to stand: 14.6 sec with minimal UE pushing up from thighs Timed up and go (TUG):  9.5 sec without assistive device 3 min walk test:  730 ft without assistive device  GAIT: Distance walked: 50 Assistive device utilized: None Level of assistance: Complete Independence Comments: antalgic, guarded    TODAY'S TREATMENT  Date: 04/01/22 Recumbent bike x 5 min (Nustep not available - [name] present to discuss status and progress) PPT x 20 (needed minor verbal and tactile cues for engaging TA today) PPT with 90/90 heel tap x 20 (minimal verbal and tactile cues to maintain PPT) Supine lower trunk rotation x 20 PPT with dying bug 2 x 10 ([name] doing well with sequencing) PPT with SLR to yellow plyo ball shoulder flexion x 20 each LE (needed heavy verbal and tactile cues) Attempted several other core strengthening exercises but [name] struggles terribly with proper technique and needs heavy verbal and tactile cues to complete these properly.  Time did not allow to  add any more.   Addaday to bilateral low back, S.I. area, piriformis area x 10 min instructing spouse in precautions regarding avoiding spine or bony prominences due to her osteoporosis.   Date: 03/27/22 Supine lower trunk rotation x 20 Supine hamstring stretch 2 x 30 sec Supine IT band stretch 2  x 30 sec Hook lying piriformis stretch 2 x 30 sec each PPT x 20 (needed increased verbal and tactile cues for engaging TA today) PPT with 90/90 heel tap x 20 (minimal verbal and tactile cues to maintain PPT) PPT with dying bug 2 x 10 ([name] doing well with sequencing) Lower trunk rotation 5x10 sec bilat Spouse in attendance today to learn how to use the thera-gun and know what to order  Addaday to bilateral low back, S.I. area, piriformis area x 10 min instructing spouse in precautions regarding avoiding spine or bony prominences due to her osteoporosis.   Date: 03/25/22 Nustep level 4 x 6 min with [name] present to discuss status PPT x 20 (needed minimal verbal and tactile cues for engaging TA) PPT with 90/90 heel tap x 20 (minimal verbal and tactile cues to maintain PPT) PPT with dying bug 2 x 10 ([name] improving with sequencing) Lower trunk rotation 5x10 sec bilat Lengthy amount of time educating [name] on anatomy of the spine, how degenerative disc and stenosis are affected by standing vs. Walking, proper progression of walking, rest times when she has to stand and cook.   Addaday to bilateral low back, S.I. area, piriformis area x 10 min   [name] EDUCATION:  Education details: Initiated  HEP Person educated: [name] Education method: Explanation, Demonstration, Verbal cues, and Handouts Education comprehension: verbalized understanding, returned demonstration, and verbal cues required   HOME EXERCISE PROGRAM:  Access Code: [email or web address] URL: [email or web address] Date: 03/20/2022 Prepared by: Candyce Churn  Exercises - Supine Hamstring Stretch with Strap  - 1 x daily  - 7 x weekly - 1 sets - 3 reps - 30 sec hold - Supine ITB Stretch with Strap  - 1 x daily - 7 x weekly - 1 sets - 3 reps - 30 sec hold - Supine Piriformis Stretch Pulling Heel to Hip  - 1 x daily - 7 x weekly - 1 sets - 3 reps - 30 sec hold - Supine Posterior Pelvic Tilt  - 1 x daily - 7 x weekly - 1 sets - 20 reps - Standing Quad Stretch with Table and Chair Support  - 1 x daily - 7 x weekly - 1 sets - 3 reps - 30 sec hold - Seated Piriformis Stretch  - 1 x daily - 7 x weekly - 1 sets - 3 reps - 20 sec hold - Seated Hamstring Stretch  - 2 x daily - 7 x weekly - 1 sets - 3 reps - 20 hold - Sidelying Open Book Thoracic Lumbar Rotation and Extension  - 2 x daily - 7 x weekly - 1 sets - 10 reps - Clamshell  - 1 x daily - 7 x weekly - 2 sets - 10 reps - Prone Quadriceps Stretch with Strap  - 1 x daily - 7 x weekly - 1 sets - 3 reps - 30 sec hold - Supine 90/90 Alternating Heel Touches with Posterior Pelvic Tilt  - 1 x daily - 7 x weekly - 2 sets - 10 reps - Supine Dead Bug with Leg Extension  - 1 x daily - 7 x weekly - 2 sets - 10 reps - Reverse Crunch with Mini Swiss Ball  - 1 x daily - 7 x weekly - 2 sets - 10 repsAccess Code: [email or web address] URL: [email or web address] Date: 02/28/2022 Prepared by: Shelby Dubin Menke    ASSESSMENT:  CLINICAL IMPRESSION: [name] still struggles with ability to maintain neutral pelvis, engage transverse abdominus.  She needs repeated instruction on this despite heavy verbal and tactile cues.  She works diligently at home to practice her HEP and is very eager to complete her exercises.  She is overall doing really well compared to her initial visit.  She should continue to improve but will need added attention to teaching technique.  [name] continues to require skilled [name] to progress towards goal related activities.   OBJECTIVE IMPAIRMENTS Abnormal gait, decreased mobility, difficulty walking, decreased ROM, decreased strength, increased fascial restrictions,  increased muscle spasms, impaired flexibility, postural dysfunction, obesity, and pain.   ACTIVITY LIMITATIONS carrying, lifting, bending, sitting, standing, squatting, sleeping, stairs, transfers, bed mobility, and dressing  PARTICIPATION LIMITATIONS: meal prep, cleaning, laundry, driving, shopping, community activity, and yard work  PERSONAL FACTORS Behavior pattern, Fitness, Past/current experiences, Time since onset of injury/illness/exacerbation, and 1-2 comorbidities: chronic pain, RA  are also affecting [name]'s functional outcome.   REHAB POTENTIAL: Good  CLINICAL DECISION MAKING: Evolving/moderate complexity  EVALUATION COMPLEXITY: Moderate   GOALS: Goals reviewed with [name]? Yes  SHORT TERM GOALS: Target date: 03/11/2022  Pain report to be no greater than 4/10  Baseline: Goal status: IN PROGRESS  2.  [name] will be independent with initial HEP  Baseline:  Goal status: MET  3.  [name] to be able  to stand or walk for at least 15 min without leg pain  Baseline:  Goal status: IN PROGRESS   LONG TERM GOALS: Target date: 04/08/2022  [name] to report pain no greater than 2/10  Baseline:  Goal status: IN PROGRESS  2.  [name] to be independent with advanced HEP  Baseline:  Goal status: IN PROGRESS  3.  [name] to be able to stand or walk for at least 30 min without pain Baseline:  Goal status: IN PROGRESS  4.  FOTO to be 71%. Baseline: 57% Goal status: IN PROGRESS  5.  5 times sit to stand to improve by 2 seconds Baseline: 14.6 sec on 02/21/22 Goal status: IN PROGRESS  6.  TUG test to improve by 2 seconds Baseline:  9.5 sec on 02/21/22 Goal status: IN PROGRESS   PLAN: [name] FREQUENCY: 2x/week  [name] DURATION: 8 weeks  PLANNED INTERVENTIONS: Therapeutic exercises, Therapeutic activity, Neuromuscular re-education, Balance training, Gait training, [name]/Family education, Self Care, Joint mobilization, Stair training, DME instructions, Aquatic Therapy,  Dry Needling, Electrical stimulation, Spinal mobilization, Cryotherapy, Moist heat, Taping, Traction, Ultrasound, Ionotophoresis 77m/ml Dexamethasone, Manual therapy, and Re-evaluation.  PLAN FOR NEXT SESSION: Continue reviewing exercises to insure good technique.  Add in higher level core exercises if [name] can perform these properly without heavy verbal cues.     JAnderson MaltaB. Tiah Heckel, [name] 04/01/22 12:12 PM   [address] [place] [address]Phone # [phone] [phone]

## 2022-04-03 ENCOUNTER — Ambulatory Visit: Payer: BC Managed Care – PPO

## 2022-04-03 DIAGNOSIS — R252 Cramp and spasm: Secondary | ICD-10-CM

## 2022-04-03 DIAGNOSIS — M5459 Other low back pain: Secondary | ICD-10-CM

## 2022-04-03 DIAGNOSIS — R262 Difficulty in walking, not elsewhere classified: Secondary | ICD-10-CM

## 2022-04-03 DIAGNOSIS — R293 Abnormal posture: Secondary | ICD-10-CM

## 2022-04-03 DIAGNOSIS — M6281 Muscle weakness (generalized): Secondary | ICD-10-CM

## 2022-04-03 NOTE — Therapy (Signed)
OUTPATIENT PHYSICAL THERAPY TREATMENT NOTE   Patient Name: [name] MRN: [id number] DOB:11/24/1960, 61 y.o., female Today's Date: 04/03/2022   PT End of Session - 04/03/22 0937     Visit Number 11    Date for PT Re-Evaluation 04/08/22    Authorization Type BC[employer]    PT Start Time 0933    PT Stop Time 7829    PT Time Calculation (min) 42 min    Activity Tolerance Patient tolerated treatment well    Behavior During Therapy WFL for tasks assessed/performed              Past Medical History:  Diagnosis Date   Adjustment disorder with anxiety    B12 deficiency due to diet    is a vegetarian   Chest pain, unspecified    Chronic anticoagulation    Fatty liver    Per patient   GERD (gastroesophageal reflux disease)    Headache(784.0)    migraines and head pain   Hx of varicella    As Child   Iron deficiency anemia, unspecified    Otalgia, unspecified    Other and unspecified hyperlipidemia    Palpitations    Personal history of venous thrombosis and embolism    Recurrent UTI    Rheumatoid arthritis(714.0)    deveshewar   Unspecified vitamin D deficiency    UTI (lower urinary tract infection) 11/28/2011   citrobacter      Past Surgical History:  Procedure Laterality Date   ORIF ULNAR FRACTURE     TYMPANOSTOMY TUBE PLACEMENT  2009   Right   Patient Active Problem List   Diagnosis Date Noted   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[place]) 01/06/2018   Long term (current) use of anticoagulants 02/11/2017   Sicca syndrome ([place]) 12/22/2016   ANA positive 12/22/2016   Raised intraocular pressure of both eyes 12/22/2016   Migraine 03/31/2016   Anxiety 03/31/2016   High risk medication use 03/31/2016   Primary osteoarthritis of both feet 03/31/2016   Primary osteoarthritis of both knees 03/31/2016   Stiffness of left hand joint 11/27/2014   Elevated IOP 06/09/2014   Long term current use of systemic steroids 06/09/2014   Medically complex patient  06/09/2014   Current use of steroid medication 01/09/2014   Xerosis of skin 12/07/2013   Encounter for therapeutic drug monitoring 06/09/2013   B12 deficiency 05/31/2013   Iron deficiency 05/31/2013   Visit for preventive health examination 05/25/2012   Mammogram abnormal 04/12/2012   Iliac crest  pain 01/23/2012   Vaginal atrophy 01/23/2012   History of recurrent UTI (urinary tract infection) 01/23/2012   Osteoporosis 04/29/2011   Nonspecific abnormal results of liver function study 12/08/2010   Chronic anticoagulation    Anticoagulant long-term use 07/15/2010   Anticardiolipin antibody positive 07/15/2010   Chronic cough 03/19/2009   CHEST PAIN-UNSPECIFIED 08/26/2008   PALPITATIONS, RECURRENT 07/24/2008   Vitamin D deficiency 05/30/2008   HYPERLIPIDEMIA 05/30/2008   IRON DEFICIENCY 05/30/2008   Adjustment disorder with anxiety 05/30/2008   Rheumatoid arthritis ([place]) 05/30/2008   PULMONARY EMBOLISM, HX OF 05/30/2008    PCP: Burnis Medin, MD   REFERRING PROVIDER: Bo Merino, MD   REFERRING DIAG: M51.36 (ICD-10-CM) - DDD (degenerative disc disease), lumbar M54.50 (ICD-10-CM) - Acute midline low back pain without sciatica   Rationale for Evaluation and Treatment Rehabilitation  THERAPY DIAG:  Other low back pain  Abnormal posture  Cramp and spasm  Muscle weakness (generalized)  Difficulty in walking, not elsewhere classified  ONSET DATE: 01/30/2022   SUBJECTIVE:                                                                                                                                                                                           SUBJECTIVE STATEMENT: Pt reports she feels like she is doing pretty good.  "I had pain for most of the day yesterday but its not as intense as it used to be".          PERTINENT HISTORY:  Most recent MD note:  [name] is a 61 y.o. female with history of seronegative rheumatoid arthritis.  According the  patient about 2 weeks ago she started having lower back pain which gradually got worse.  She did not want to go to the emergency room as she was concerned about COVID-19 virus exposure.  She was seen by Dr.Xu on January 28, 2022.  She had x-rays of her lumbar spine which showed some degenerative changes.  She was offered a prednisone taper which she declined.  He also gave her prescription for Flexeril 5 mg which she has not started yet.  She states the Lidoderm patches were not covered by her insurance.  She continues to have lower back pain.  None of the other joints are painful or swollen.  She states that she had some discomfort in her left heel which comes and goes.  She has been taking methotrexate 8 tablets p.o. weekly and hydroxychloroquine 1 tablet p.o. twice daily Monday to Friday  PAIN:  Are you having pain? Yes: NPRS scale: 2/10 Pain location: low back Pain description: aching Aggravating factors: standing, sitting Relieving factors: heat prednisone dose pack   PRECAUTIONS: None  WEIGHT BEARING RESTRICTIONS No  FALLS:  Has patient fallen in last 6 months? No  LIVING ENVIRONMENT: Lives with: lives with their spouse Lives in: House/apartment Stairs: Yes: Internal: 12 steps; can reach both Has following equipment at home: None  OCCUPATION: Retired  PLOF: Independent, Independent with basic ADLs, Independent with household mobility without device, Independent with community mobility without device, Independent with homemaking with ambulation, Independent with gait, and Independent with transfers  PATIENT GOALS "I just want to be pain free"   OBJECTIVE:   DIAGNOSTIC FINDINGS:  Xray 01/28/22 Mild degenerative changes L5-S1.  Mild facet disease  PATIENT SURVEYS:  FOTO 57 goal is 12  SCREENING FOR RED FLAGS: Bowel or bladder incontinence: No Spinal tumors: No Cauda equina syndrome: No Compression fracture: No Abdominal aneurysm: No  COGNITION:  Overall cognitive  status: Within functional limits for tasks assessed     SENSATION: WFL  MUSCLE LENGTH: Hamstrings: Right 45  deg; Left 50 deg Thomas test: Right pos ; Left pos   POSTURE: increased lumbar lordosis   LUMBAR ROM:   Active  A/PROM  eval  Flexion WNL  Extension WNL  Right lateral flexion Fingertips to distal thigh  Left lateral flexion Fingertips to joint line  Right rotation WNL  Left rotation WNL   (Blank rows = not tested)  LOWER EXTREMITY ROM:     WFL  LOWER EXTREMITY MMT:    Generally 4-/5 bilateral LE's  LUMBAR SPECIAL TESTS:  Straight leg raise test: Negative, Slump test: Negative, Stork standing: not tested, and FABER test: Negative  FUNCTIONAL TESTS:   Eval: 5 times sit to stand: test next visit Timed up and go (TUG): test next visit  02/21/2022: 5 times sit to stand: 14.6 sec with minimal UE pushing up from thighs Timed up and go (TUG):  9.5 sec without assistive device 3 min walk test:  730 ft without assistive device  GAIT: Distance walked: 50 Assistive device utilized: None Level of assistance: Complete Independence Comments: antalgic, guarded    TODAY'S TREATMENT  Date: 04/03/22 Nustep x 5 min level 5 PPT x 20 (no need for vc's for engaging TA today) PPT with 90/90 heel tap x 20 (minimal verbal and tactile cues to maintain PPT) PPT with dying bug 2 x 10 (patient doing well with sequencing) PPT with SLR to yellow plyo ball shoulder flexion x 20 each LE Supine lower trunk rotation x 20 PPT reverse sit up attempted - patient could not engage TA enough to tolerate PPT with hollow hold upper extremity reach toward ceiling.    Date: 04/01/22 Recumbent bike x 5 min (Nustep not available - PT present to discuss status and progress) PPT x 20 (needed minor verbal and tactile cues for engaging TA today) PPT with 90/90 heel tap x 20 (minimal verbal and tactile cues to maintain PPT) Supine lower trunk rotation x 20 PPT with dying bug 2 x 10 (patient  doing well with sequencing) PPT with SLR to yellow plyo ball shoulder flexion x 20 each LE (needed heavy verbal and tactile cues) Attempted several other core strengthening exercises but patient struggles terribly with proper technique and needs heavy verbal and tactile cues to complete these properly.  Time did not allow to add any more.   Addaday to bilateral low back, S.I. area, piriformis area x 10 min instructing spouse in precautions regarding avoiding spine or bony prominences due to her osteoporosis.   Date: 03/27/22 Supine lower trunk rotation x 20 Supine hamstring stretch 2 x 30 sec Supine IT band stretch 2  x 30 sec Hook lying piriformis stretch 2 x 30 sec each PPT x 20 (needed increased verbal and tactile cues for engaging TA today) PPT with 90/90 heel tap x 20 (minimal verbal and tactile cues to maintain PPT) PPT with dying bug 2 x 10 (patient doing well with sequencing) Lower trunk rotation 5x10 sec bilat Spouse in attendance today to learn how to use the thera-gun and know what to order  Addaday to bilateral low back, S.I. area, piriformis area x 10 min instructing spouse in precautions regarding avoiding spine or bony prominences due to her osteoporosis.     PATIENT EDUCATION:  Education details: Initiated HEP Person educated: Patient Education method: Consulting civil engineer, Media planner, Verbal cues, and Handouts Education comprehension: verbalized understanding, returned demonstration, and verbal cues required   HOME EXERCISE PROGRAM:  Access Code: [email or web address] URL: [email or web address] Date: 03/20/2022 Prepared by: Candyce Churn  Exercises -  Supine Hamstring Stretch with Strap  - 1 x daily - 7 x weekly - 1 sets - 3 reps - 30 sec hold - Supine ITB Stretch with Strap  - 1 x daily - 7 x weekly - 1 sets - 3 reps - 30 sec hold - Supine Piriformis Stretch Pulling Heel to Hip  - 1 x daily - 7 x weekly - 1 sets - 3 reps - 30 sec hold - Supine Posterior Pelvic Tilt   - 1 x daily - 7 x weekly - 1 sets - 20 reps - Standing Quad Stretch with Table and Chair Support  - 1 x daily - 7 x weekly - 1 sets - 3 reps - 30 sec hold - Seated Piriformis Stretch  - 1 x daily - 7 x weekly - 1 sets - 3 reps - 20 sec hold - Seated Hamstring Stretch  - 2 x daily - 7 x weekly - 1 sets - 3 reps - 20 hold - Sidelying Open Book Thoracic Lumbar Rotation and Extension  - 2 x daily - 7 x weekly - 1 sets - 10 reps - Clamshell  - 1 x daily - 7 x weekly - 2 sets - 10 reps - Prone Quadriceps Stretch with Strap  - 1 x daily - 7 x weekly - 1 sets - 3 reps - 30 sec hold - Supine 90/90 Alternating Heel Touches with Posterior Pelvic Tilt  - 1 x daily - 7 x weekly - 2 sets - 10 reps - Supine Dead Bug with Leg Extension  - 1 x daily - 7 x weekly - 2 sets - 10 reps - Reverse Crunch with Mini Swiss Ball  - 1 x daily - 7 x weekly - 2 sets - 10 repsAccess Code: [email or web address] URL: [email or web address] Date: 02/28/2022 Prepared by: Shelby Dubin Menke    ASSESSMENT:  CLINICAL IMPRESSION: [name] did very well today.  We attempted some higher level core exercises.  She was unable to do one of these but could do a modified version of this.  She c/o neck pain which was more likely fatigue.  Instructed patient is safe position for neck and head for these exercises and to only do 5 reps at a time.  She is due for re-assessment next visit.  We will likely continue for a few more visits to insure patient is performing exercises with appropriate technique.   Pt continues to require skilled PT to progress towards goal related activities.   OBJECTIVE IMPAIRMENTS Abnormal gait, decreased mobility, difficulty walking, decreased ROM, decreased strength, increased fascial restrictions, increased muscle spasms, impaired flexibility, postural dysfunction, obesity, and pain.   ACTIVITY LIMITATIONS carrying, lifting, bending, sitting, standing, squatting, sleeping, stairs, transfers, bed mobility, and  dressing  PARTICIPATION LIMITATIONS: meal prep, cleaning, laundry, driving, shopping, community activity, and yard work  PERSONAL FACTORS Behavior pattern, Fitness, Past/current experiences, Time since onset of injury/illness/exacerbation, and 1-2 comorbidities: chronic pain, RA  are also affecting patient's functional outcome.   REHAB POTENTIAL: Good  CLINICAL DECISION MAKING: Evolving/moderate complexity  EVALUATION COMPLEXITY: Moderate   GOALS: Goals reviewed with patient? Yes  SHORT TERM GOALS: Target date: 03/11/2022  Pain report to be no greater than 4/10  Baseline: Goal status: IN PROGRESS  2.  Patient will be independent with initial HEP  Baseline:  Goal status: MET  3.  Patient to be able to stand or walk for at least 15 min without leg pain  Baseline:  Goal status: IN PROGRESS  LONG TERM GOALS: Target date: 04/08/2022  Patient to report pain no greater than 2/10  Baseline:  Goal status: IN PROGRESS  2.  Patient to be independent with advanced HEP  Baseline:  Goal status: IN PROGRESS  3.  Patient to be able to stand or walk for at least 30 min without pain Baseline:  Goal status: IN PROGRESS  4.  FOTO to be 71%. Baseline: 57% Goal status: IN PROGRESS  5.  5 times sit to stand to improve by 2 seconds Baseline: 14.6 sec on 02/21/22 Goal status: IN PROGRESS  6.  TUG test to improve by 2 seconds Baseline:  9.5 sec on 02/21/22 Goal status: IN PROGRESS   PLAN: PT FREQUENCY: 2x/week  PT DURATION: 8 weeks  PLANNED INTERVENTIONS: Therapeutic exercises, Therapeutic activity, Neuromuscular re-education, Balance training, Gait training, Patient/Family education, Self Care, Joint mobilization, Stair training, DME instructions, Aquatic Therapy, Dry Needling, Electrical stimulation, Spinal mobilization, Cryotherapy, Moist heat, Taping, Traction, Ultrasound, Ionotophoresis 43m/ml Dexamethasone, Manual therapy, and Re-evaluation.  PLAN FOR NEXT SESSION:  Re-assessment/recert.  Continue reviewing exercises to insure good technique.  Add in higher level core exercises if patient can perform these properly without heavy verbal cues.     JAnderson MaltaB. , PT 04/03/22 11:53 AM   [facility]Hol[facility] [address] SLeadGSchoeneck [place] [address]Phone # [phone] [phone]

## 2022-04-04 ENCOUNTER — Other Ambulatory Visit: Payer: Self-pay | Admitting: Physician Assistant

## 2022-04-04 NOTE — Telephone Encounter (Signed)
Next Visit: 1/253/2024  Last Visit: 01/30/2022  Labs: CBC and CMP are normal.     Eye exam: 02/19/2022 WNL   Current Dose per office note 01/30/2022: hydroxychloroquine 200 mg twice daily   DX: Rheumatoid arthritis of multiple sites with negative rheumatoid factor   Last Fill: 01/13/2022  Okay to refill Plaquenil?

## 2022-04-07 ENCOUNTER — Ambulatory Visit (INDEPENDENT_AMBULATORY_CARE_PROVIDER_SITE_OTHER): Payer: BC Managed Care – PPO | Admitting: Psychology

## 2022-04-07 DIAGNOSIS — F4322 Adjustment disorder with anxiety: Secondary | ICD-10-CM

## 2022-04-07 NOTE — Progress Notes (Signed)
04/07/2022  Treatrment Plan:  Diagnosis F41.1 (Generalized anxiety disorder) [n/a]  Z62.820 (Parent child relationship problem) [n/a]  Symptoms Excessive and/or unrealistic worry that is difficult to control occurring more days than not for at least 6 months about a number of events or activities. (Status: maintained) -- No Description Entered  Regularly overindulges their child's wishes and demands. (Status: maintained) -- No Description Entered  Medication Status compliance  Safety none  If Suicidal or Homicidal State Action Taken: unspecified  Current Risk: low Medications unspecified Objectives Related Problem: Achieve a level of competent, effective parenting. Description: Increase the gradual letting go of their adolescent in constructive, affirmative ways. Target Date: 2022-05-14 Frequency: Daily Modality: individual Progress: 80%  Related Problem: Achieve a level of competent, effective parenting. Description: Identify unresolved childhood issues that affect parenting and work toward their resolution. Target Date: 2022-05-14 Frequency: Daily Modality: individual Progress: 50%  Related Problem: Achieve a level of competent, effective parenting. Description: Freely express feelings of frustration, helplessness, and inadequacy that each experiences in the parenting role. Target Date: 2022-05-14 Frequency: Daily Modality: individual Progress: 80%  Related Problem: Resolve the core conflict that is the source of anxiety. Description: Learn and implement problem-solving strategies for realistically addressing worries. Target Date: 2022-05-14 Frequency: Daily Modality: individual Progress: 70%  Related Problem: Resolve the core conflict that is the source of anxiety. Description: Learn and implement calming skills to reduce overall anxiety and manage anxiety symptoms. Target Date: 2022-05-14 Frequency:  Daily Modality: individual Progress: 70%  Related Problem: Resolve the core conflict that is the source of anxiety. Description: Describe situations, thoughts, feelings, and actions associated with anxieties and worries, their impact on functioning, and attempts to resolve them. Target Date: 2022-05-14 Frequency: Daily Modality: individual Progress: 60%  Client Response full compliance  Service Location Location, [address] [place], [place] [address]  Service Code cpt [phone]  Behavioral activation plan  Facilitate problem solving  Identify automatic thoughts  Emotion regulation skills  Provide education, information  Self care activities  Relaxation training  Lifestyle change (exercise, nutrition)  Self-monitoring  Validate/empathize  Session notes: F 41.1  Goals: She has chronic anxieties, mostly related to health concerns and her son's well-being. Would like to develop strategies to manage symptoms of anxiety. Needs to understand enmeshment and learn to define and manage appropriate family boundaries. Target date is 12-23. Patient has realized significant improvement in recognizing and establishing appropriate boundaries. Anxiety about health and family matters persist and she desires to continue treatment in an effort to manage and reduce these feelings. Target date is 12-23.  Meds: Xanax (.25 mg)  Patient agrees to a video session. She is at home and I am in my home office.  Michalina and [name]: [name] has decided not to return home during Thanksgiving holiday because he feels he must stay there to study and "get ahead". Saffron is most concerned about him finding a job upon graduation. He says he is looking at a variety of positions, but has not found anything that makes him excited. She is strongly suggnesting to her son that he take the job being offered in [place]. I encouraged her to allow [name] to make his own decision and not pressure him.She is worried  that [name] is more focused on prestige rather than making the best decision for  himself. He does not disagree. He feels that large cities are too much of a struggle for him due to his anxieties and he is best served living in a smaller more manageable city. Kristianna recognizes this problem, which increases her anxiety. Talked with her about her difficulty letting go and allowing [name] to make his decisions and manage his fears. Told him he needs to own responsibility for addressing his anxiety and until until he does so, there is "no good choice". That is, he will have to settle for a less gratifying job to live in a "safer" environment or take a more desirable job in a location that he will be completely intimidated. I once again strongly encouraged him to invest in treatment to resolve his anxiety in and out of work.                      Marcelina Morel, PhD 10:40p-11:30p 50 min

## 2022-04-08 ENCOUNTER — Ambulatory Visit: Payer: BC Managed Care – PPO

## 2022-04-08 DIAGNOSIS — R293 Abnormal posture: Secondary | ICD-10-CM

## 2022-04-08 DIAGNOSIS — R252 Cramp and spasm: Secondary | ICD-10-CM

## 2022-04-08 DIAGNOSIS — R262 Difficulty in walking, not elsewhere classified: Secondary | ICD-10-CM

## 2022-04-08 DIAGNOSIS — M5459 Other low back pain: Secondary | ICD-10-CM | POA: Diagnosis not present

## 2022-04-08 DIAGNOSIS — M6281 Muscle weakness (generalized): Secondary | ICD-10-CM

## 2022-04-08 NOTE — Therapy (Signed)
OUTPATIENT PHYSICAL THERAPY TREATMENT NOTE   Patient Name: [name] MRN: [id number] DOB:[birth year], 61 y.o., female Today's Date: 04/08/2022   PT End of Session - 04/08/22 0938     Visit Number 12    Date for PT Re-Evaluation 06/03/22    Authorization Type [employer]    PT Start Time 0932    PT Stop Time 1020    PT Time Calculation (min) 48 min    Activity Tolerance Patient tolerated treatment well    Behavior During Therapy WFL for tasks assessed/performed              Past Medical History:  Diagnosis Date   Adjustment disorder with anxiety    B12 deficiency due to diet    is a vegetarian   Chest pain, unspecified    Chronic anticoagulation    Fatty liver    Per patient   GERD (gastroesophageal reflux disease)    Headache(784.0)    migraines and head pain   Hx of varicella    As Child   Iron deficiency anemia, unspecified    Otalgia, unspecified    Other and unspecified hyperlipidemia    Palpitations    Personal history of venous thrombosis and embolism    Recurrent UTI    Rheumatoid arthritis(714.0)    deveshewar   Unspecified vitamin D deficiency    UTI (lower urinary tract infection) 11/28/2011   citrobacter      Past Surgical History:  Procedure Laterality Date   ORIF ULNAR FRACTURE     TYMPANOSTOMY TUBE PLACEMENT  2009   Right   Patient Active Problem List   Diagnosis Date Noted   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[place]) 01/06/2018   Long term (current) use of anticoagulants 02/11/2017   Sicca syndrome ([place]) 12/22/2016   ANA positive 12/22/2016   Raised intraocular pressure of both eyes 12/22/2016   Migraine 03/31/2016   Anxiety 03/31/2016   High risk medication use 03/31/2016   Primary osteoarthritis of both feet 03/31/2016   Primary osteoarthritis of both knees 03/31/2016   Stiffness of left hand joint 11/27/2014   Elevated IOP 06/09/2014   Long term current use of systemic steroids 06/09/2014   Medically complex patient  06/09/2014   Current use of steroid medication 01/09/2014   Xerosis of skin 12/07/2013   Encounter for therapeutic drug monitoring 06/09/2013   B12 deficiency 05/31/2013   Iron deficiency 05/31/2013   Visit for preventive health examination 05/25/2012   Mammogram abnormal 04/12/2012   Iliac crest  pain 01/23/2012   Vaginal atrophy 01/23/2012   History of recurrent UTI (urinary tract infection) 01/23/2012   Osteoporosis 04/29/2011   Nonspecific abnormal results of liver function study 12/08/2010   Chronic anticoagulation    Anticoagulant long-term use 07/15/2010   Anticardiolipin antibody positive 07/15/2010   Chronic cough 03/19/2009   CHEST PAIN-UNSPECIFIED 08/26/2008   PALPITATIONS, RECURRENT 07/24/2008   Vitamin D deficiency 05/30/2008   HYPERLIPIDEMIA 05/30/2008   IRON DEFICIENCY 05/30/2008   Adjustment disorder with anxiety 05/30/2008   Rheumatoid arthritis ([place]) 05/30/2008   PULMONARY EMBOLISM, HX OF 05/30/2008    PCP: Krystal Medin, [name]   REFERRING PROVIDER: Bo Merino, [name]   REFERRING DIAG: M51.36 (ICD-10-CM) - DDD (degenerative disc disease), lumbar M54.50 (ICD-10-CM) - Acute midline low back pain without sciatica   Rationale for Evaluation and Treatment Rehabilitation  THERAPY DIAG:  Other low back pain  Abnormal posture  Cramp and spasm  Muscle weakness (generalized)  Difficulty in walking, not elsewhere classified  ONSET DATE: 01/30/2022   SUBJECTIVE:                                                                                                                                                                                           SUBJECTIVE STATEMENT: Pt reports she is doing good today.  "I feel like I am finally getting better"       PERTINENT HISTORY:  Most recent [name] note:  [name] is a 61 y.o. female with history of seronegative rheumatoid arthritis.  According the patient about 2 weeks ago she started having lower back pain  which gradually got worse.  She did not want to go to the emergency room as she was concerned about COVID-19 virus exposure.  She was seen by [name] on January 28, 2022.  She had x-rays of her lumbar spine which showed some degenerative changes.  She was offered a prednisone taper which she declined.  He also gave her prescription for Flexeril 5 mg which she has not started yet.  She states the Lidoderm patches were not covered by her insurance.  She continues to have lower back pain.  None of the other joints are painful or swollen.  She states that she had some discomfort in her left heel which comes and goes.  She has been taking methotrexate 8 tablets p.o. weekly and hydroxychloroquine 1 tablet p.o. twice daily Monday to Friday  PAIN:  Are you having pain? Yes: NPRS scale: not even a 1 (like a .25)/10 Pain location: low back Pain description: aching Aggravating factors: standing, sitting Relieving factors: heat prednisone dose pack   PRECAUTIONS: None  WEIGHT BEARING RESTRICTIONS No  FALLS:  Has patient fallen in last 6 months? No  LIVING ENVIRONMENT: Lives with: lives with their spouse Lives in: House/apartment Stairs: Yes: Internal: 12 steps; can reach both Has following equipment at home: None  OCCUPATION: Retired  PLOF: Independent, Independent with basic ADLs, Independent with household mobility without device, Independent with community mobility without device, Independent with homemaking with ambulation, Independent with gait, and Independent with transfers  PATIENT GOALS "I just want to be pain free"   OBJECTIVE:   DIAGNOSTIC FINDINGS:  Xray 01/28/22 Mild degenerative changes L5-S1.  Mild facet disease  PATIENT SURVEYS:  FOTO 57 goal is 71 04/08/22: 44  SCREENING FOR RED FLAGS: Bowel or bladder incontinence: No Spinal tumors: No Cauda equina syndrome: No Compression fracture: No Abdominal aneurysm: No  COGNITION:  Overall cognitive status: Within functional  limits for tasks assessed     SENSATION: WFL  MUSCLE LENGTH: Hamstrings: Right 50 deg; Left 60 deg Thomas test: Right pos ;  Left pos   POSTURE: increased lumbar lordosis   LUMBAR ROM:   Active  A/PROM  eval A/PROM 04/08/22  Flexion WNL WNL  Extension WNL WNL  Right lateral flexion Fingertips to distal thigh Fingertips to joint line  Left lateral flexion Fingertips to joint line Fingertips to joint line  Right rotation WNL WNL  Left rotation WNL WNL   (Blank rows = not tested)  LOWER EXTREMITY ROM:     WFL  LOWER EXTREMITY MMT:    Generally 4-/5 bilateral LE's 04/08/22: improved to 4/5 bilateral LE's  LUMBAR SPECIAL TESTS:  Straight leg raise test: Negative, Slump test: Negative, Stork standing: not tested, and FABER test: Negative  FUNCTIONAL TESTS:   Eval: 5 times sit to stand: test next visit Timed up and go (TUG): test next visit  02/21/2022: 5 times sit to stand: 14.6 sec with minimal UE pushing up from thighs Timed up and go (TUG):  9.5 sec without assistive device 3 min walk test:  730 ft without assistive device  04/08/2022: 5 times sit to stand: 10.42 sec without UE use Timed up and go (TUG):  7.18 sec without assistive device 3 min walk test:  885 ft without assistive device  GAIT: Eval: Distance walked: 50 Assistive device utilized: None Level of assistance: Complete Independence Comments: antalgic, guarded  Distance walked: 885 Assistive device utilized: None Level of assistance: Complete Independence Comments: normal heel to toe progression with rapid gait speed and no guarding    TODAY'S TREATMENT  Date: 04/08/22 Recumbent bike x 5 min level 1 Re-assessment to determine re-cert: see objective findings above Reviewed all of HEP and added Star Crunch to HEP  Date: 04/03/22 Nustep x 5 min level 5 PPT x 20 (no need for vc's for engaging TA today) PPT with 90/90 heel tap x 20 (minimal verbal and tactile cues to maintain PPT) PPT with  dying bug 2 x 10 (patient doing well with sequencing) PPT with SLR to yellow plyo ball shoulder flexion x 20 each LE Supine lower trunk rotation x 20 PPT reverse sit up attempted - patient could not engage TA enough to tolerate PPT with hollow hold upper extremity reach toward ceiling.    Date: 04/01/22 Recumbent bike x 5 min (Nustep not available - PT present to discuss status and progress) PPT x 20 (needed minor verbal and tactile cues for engaging TA today) PPT with 90/90 heel tap x 20 (minimal verbal and tactile cues to maintain PPT) Supine lower trunk rotation x 20 PPT with dying bug 2 x 10 (patient doing well with sequencing) PPT with SLR to yellow plyo ball shoulder flexion x 20 each LE (needed heavy verbal and tactile cues) Attempted several other core strengthening exercises but patient struggles terribly with proper technique and needs heavy verbal and tactile cues to complete these properly.  Time did not allow to add any more.   Addaday to bilateral low back, S.I. area, piriformis area x 10 min instructing spouse in precautions regarding avoiding spine or bony prominences due to her osteoporosis.   PATIENT EDUCATION:  Education details: Initiated HEP Person educated: Patient Education method: Consulting civil engineer, Media planner, Verbal cues, and Handouts Education comprehension: verbalized understanding, returned demonstration, and verbal cues required   HOME EXERCISE PROGRAM:  Access Code: [email or web address] URL: [email or web address] Date: 03/20/2022 Prepared by: Candyce Churn  Exercises - Supine Hamstring Stretch with Strap  - 1 x daily - 7 x weekly - 1 sets - 3 reps - 30 sec hold - Supine ITB  Stretch with Strap  - 1 x daily - 7 x weekly - 1 sets - 3 reps - 30 sec hold - Supine Piriformis Stretch Pulling Heel to Hip  - 1 x daily - 7 x weekly - 1 sets - 3 reps - 30 sec hold - Supine Posterior Pelvic Tilt  - 1 x daily - 7 x weekly - 1 sets - 20 reps - Standing Quad  Stretch with Table and Chair Support  - 1 x daily - 7 x weekly - 1 sets - 3 reps - 30 sec hold - Seated Piriformis Stretch  - 1 x daily - 7 x weekly - 1 sets - 3 reps - 20 sec hold - Seated Hamstring Stretch  - 2 x daily - 7 x weekly - 1 sets - 3 reps - 20 hold - Sidelying Open Book Thoracic Lumbar Rotation and Extension  - 2 x daily - 7 x weekly - 1 sets - 10 reps - Clamshell  - 1 x daily - 7 x weekly - 2 sets - 10 reps - Prone Quadriceps Stretch with Strap  - 1 x daily - 7 x weekly - 1 sets - 3 reps - 30 sec hold - Supine 90/90 Alternating Heel Touches with Posterior Pelvic Tilt  - 1 x daily - 7 x weekly - 2 sets - 10 reps - Supine Dead Bug with Leg Extension  - 1 x daily - 7 x weekly - 2 sets - 10 reps - Reverse Crunch with Mini Swiss Ball  - 1 x daily - 7 x weekly - 2 sets - 10 repsAccess Code: [email or web address] URL: [email or web address] Date: 02/28/2022 Prepared by: Shelby Dubin Menke  04/08/22: added star crunch and provided handout (not able to locate in med bridge) 2 x 10 each side once daily    ASSESSMENT:  CLINICAL IMPRESSION: Ms Dezarn has made excellent progress and is extremely diligent with her HEP.  Her objective findings are significantly improved but this is the first day she reports "I finally feel like I am getting somewhere".  Her progress has been slow at times due to need for more lengthy education confirmation of technique. However, she is compliant and well motivated. She would benefit from continuing skilled PT to monitor symptoms and progress core exercises.  She will likely end her therapy at end of calendar year as her deductible starts over.  She hopes to meet her final goals by that time.   Pt continues to require skilled PT to progress towards goal related activities.   OBJECTIVE IMPAIRMENTS Abnormal gait, decreased mobility, difficulty walking, decreased ROM, decreased strength, increased fascial restrictions, increased muscle spasms, impaired flexibility,  postural dysfunction, obesity, and pain.   ACTIVITY LIMITATIONS carrying, lifting, bending, sitting, standing, squatting, sleeping, stairs, transfers, bed mobility, and dressing  PARTICIPATION LIMITATIONS: meal prep, cleaning, laundry, driving, shopping, community activity, and yard work  PERSONAL FACTORS Behavior pattern, Fitness, Past/current experiences, Time since onset of injury/illness/exacerbation, and 1-2 comorbidities: chronic pain, RA  are also affecting patient's functional outcome.   REHAB POTENTIAL: Good  CLINICAL DECISION MAKING: Evolving/moderate complexity  EVALUATION COMPLEXITY: Moderate   GOALS: Goals reviewed with patient? Yes  SHORT TERM GOALS: Target date: 03/11/2022  Pain report to be no greater than 4/10  Baseline: Goal status: MET  2.  Patient will be independent with initial HEP  Baseline:  Goal status: MET  3.  Patient to be able to stand or walk for at least 15 min without leg pain  Baseline:  Goal status: MET   LONG TERM GOALS: Target date: 04/08/2022  Patient to report pain no greater than 2/10  Baseline:  Goal status: MET  2.  Patient to be independent with advanced HEP  Baseline:  Goal status: IN PROGRESS  3.  Patient to be able to stand or walk for at least 30 min without pain Baseline:  Goal status: IN PROGRESS  4.  FOTO to be 71%. Baseline: 57% Goal status: IN PROGRESS  5.  5 times sit to stand to improve by 2 seconds Baseline: 14.6 sec on 02/21/22 Goal status: MET  6.  TUG test to improve by 2 seconds Baseline:  9.5 sec on 02/21/22 Goal status: MET   PLAN: PT FREQUENCY: 2x/week  PT DURATION: 8 weeks  PLANNED INTERVENTIONS: Therapeutic exercises, Therapeutic activity, Neuromuscular re-education, Balance training, Gait training, Patient/Family education, Self Care, Joint mobilization, Stair training, DME instructions, Aquatic Therapy, Dry Needling, Electrical stimulation, Spinal mobilization, Cryotherapy, Moist heat,  Taping, Traction, Ultrasound, Ionotophoresis 58m/ml Dexamethasone, Manual therapy, and Re-evaluation.  PLAN FOR NEXT SESSION: Recertifying for 8 more weeks.  Patient will likely end at end of calendar year as her deductible starts over.  We will focus on continuing flexibility and progressing core strength.      JAnderson MaltaB. Heyden Jaber, PT 04/08/22 10:58 AM   BLake [facility] the Woods[address] [place]  [id number] # [phone] [phone]

## 2022-04-08 NOTE — Telephone Encounter (Signed)
Dr Eustace Moore fields  at [facility]  ( (he has fellows so you may see an associate)  or zachary smith at  [facility]  [facility]  . Otherwise you could see  ortho team that has a foot and ankle team I tend to use  [facility] but there are many good options in town  . Podiatry may help for foot but sounds more like orto problem

## 2022-04-09 ENCOUNTER — Ambulatory Visit: Payer: BC Managed Care – PPO | Admitting: Psychology

## 2022-04-14 ENCOUNTER — Other Ambulatory Visit: Payer: Self-pay | Admitting: Physician Assistant

## 2022-04-14 NOTE — Telephone Encounter (Signed)
Next Visit: 06/10/2022  Last Visit: 01/30/2022  Last Fill: 12/02/2021  DX: Age related osteoporosis, unspecified pathological fracture presence   Current Dose per office note 01/30/2022: alendronate 70 mg p.o. weekly   Labs: 01/30/2022 CBC and CMP are normal.   Okay to refill Fosamax?

## 2022-04-17 ENCOUNTER — Ambulatory Visit: Payer: BC Managed Care – PPO

## 2022-04-18 ENCOUNTER — Ambulatory Visit: Payer: BC Managed Care – PPO | Attending: Rheumatology

## 2022-04-18 DIAGNOSIS — R252 Cramp and spasm: Secondary | ICD-10-CM | POA: Diagnosis present

## 2022-04-18 DIAGNOSIS — R293 Abnormal posture: Secondary | ICD-10-CM | POA: Insufficient documentation

## 2022-04-18 DIAGNOSIS — M6281 Muscle weakness (generalized): Secondary | ICD-10-CM | POA: Insufficient documentation

## 2022-04-18 DIAGNOSIS — R262 Difficulty in walking, not elsewhere classified: Secondary | ICD-10-CM | POA: Diagnosis present

## 2022-04-18 DIAGNOSIS — M5459 Other low back pain: Secondary | ICD-10-CM | POA: Insufficient documentation

## 2022-04-18 NOTE — Therapy (Signed)
OUTPATIENT PHYSICAL THERAPY TREATMENT NOTE   Patient Name: [name] MRN: [id number] DOB:Apr 25, 1961, 61 y.o., female Today's Date: 04/18/2022   PT End of Session - 04/18/22 0940     Visit Number 13    Date for PT Re-Evaluation 06/03/22    Authorization Type BC[employer]    PT Start Time 0935    PT Stop Time 1009    PT Time Calculation (min) 34 min    Activity Tolerance Patient tolerated treatment well    Behavior During Therapy WFL for tasks assessed/performed              Past Medical History:  Diagnosis Date   Adjustment disorder with anxiety    B12 deficiency due to diet    is a vegetarian   Chest pain, unspecified    Chronic anticoagulation    Fatty liver    Per patient   GERD (gastroesophageal reflux disease)    Headache(784.0)    migraines and head pain   Hx of varicella    As Child   Iron deficiency anemia, unspecified    Otalgia, unspecified    Other and unspecified hyperlipidemia    Palpitations    Personal history of venous thrombosis and embolism    Recurrent UTI    Rheumatoid arthritis(714.0)    deveshewar   Unspecified vitamin D deficiency    UTI (lower urinary tract infection) 11/28/2011   citrobacter      Past Surgical History:  Procedure Laterality Date   ORIF ULNAR FRACTURE     TYMPANOSTOMY TUBE PLACEMENT  2009   Right   Patient Active Problem List   Diagnosis Date Noted   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[place]) 01/06/2018   Long term (current) use of anticoagulants 02/11/2017   Sicca syndrome ([place]) 12/22/2016   ANA positive 12/22/2016   Raised intraocular pressure of both eyes 12/22/2016   Migraine 03/31/2016   Anxiety 03/31/2016   High risk medication use 03/31/2016   Primary osteoarthritis of both feet 03/31/2016   Primary osteoarthritis of both knees 03/31/2016   Stiffness of left hand joint 11/27/2014   Elevated IOP 06/09/2014   Long term current use of systemic steroids 06/09/2014   Medically complex patient 06/09/2014    Current use of steroid medication 01/09/2014   Xerosis of skin 12/07/2013   Encounter for therapeutic drug monitoring 06/09/2013   B12 deficiency 05/31/2013   Iron deficiency 05/31/2013   Visit for preventive health examination 05/25/2012   Mammogram abnormal 04/12/2012   Iliac crest  pain 01/23/2012   Vaginal atrophy 01/23/2012   History of recurrent UTI (urinary tract infection) 01/23/2012   Osteoporosis 04/29/2011   Nonspecific abnormal results of liver function study 12/08/2010   Chronic anticoagulation    Anticoagulant long-term use 07/15/2010   Anticardiolipin antibody positive 07/15/2010   Chronic cough 03/19/2009   CHEST PAIN-UNSPECIFIED 08/26/2008   PALPITATIONS, RECURRENT 07/24/2008   Vitamin D deficiency 05/30/2008   HYPERLIPIDEMIA 05/30/2008   IRON DEFICIENCY 05/30/2008   Adjustment disorder with anxiety 05/30/2008   Rheumatoid arthritis ([place]) 05/30/2008   PULMONARY EMBOLISM, HX OF 05/30/2008    PCP: Burnis Medin, MD   REFERRING PROVIDER: Bo Merino, MD   REFERRING DIAG: M51.36 (ICD-10-CM) - DDD (degenerative disc disease), lumbar M54.50 (ICD-10-CM) - Acute midline low back pain without sciatica   Rationale for Evaluation and Treatment Rehabilitation  THERAPY DIAG:  Other low back pain  Abnormal posture  Cramp and spasm  Muscle weakness (generalized)  Difficulty in walking, not elsewhere classified  ONSET DATE: 01/30/2022   SUBJECTIVE:                                                                                                                                                                                           SUBJECTIVE STATEMENT: Pt reports she is doing good.  The discomfort is still there at times but very minimal.  Pain reported at 1/10.       PERTINENT HISTORY:  Most recent MD note:  [name] is a 61 y.o. female with history of seronegative rheumatoid arthritis.  According the patient about 2 weeks ago she started  having lower back pain which gradually got worse.  She did not want to go to the emergency room as she was concerned about COVID-19 virus exposure.  She was seen by Dr.Xu on January 28, 2022.  She had x-rays of her lumbar spine which showed some degenerative changes.  She was offered a prednisone taper which she declined.  He also gave her prescription for Flexeril 5 mg which she has not started yet.  She states the Lidoderm patches were not covered by her insurance.  She continues to have lower back pain.  None of the other joints are painful or swollen.  She states that she had some discomfort in her left heel which comes and goes.  She has been taking methotrexate 8 tablets p.o. weekly and hydroxychloroquine 1 tablet p.o. twice daily Monday to Friday  PAIN:  Are you having pain? Yes: NPRS scale: not even a 1 (like a .25)/10 Pain location: low back Pain description: aching Aggravating factors: standing, sitting Relieving factors: heat prednisone dose pack   PRECAUTIONS: None  WEIGHT BEARING RESTRICTIONS No  FALLS:  Has patient fallen in last 6 months? No  LIVING ENVIRONMENT: Lives with: lives with their spouse Lives in: House/apartment Stairs: Yes: Internal: 12 steps; can reach both Has following equipment at home: None  OCCUPATION: Retired  PLOF: Independent, Independent with basic ADLs, Independent with household mobility without device, Independent with community mobility without device, Independent with homemaking with ambulation, Independent with gait, and Independent with transfers  PATIENT GOALS "I just want to be pain free"   OBJECTIVE:   DIAGNOSTIC FINDINGS:  Xray 01/28/22 Mild degenerative changes L5-S1.  Mild facet disease  PATIENT SURVEYS:  FOTO 57 goal is 71 04/08/22: 41  SCREENING FOR RED FLAGS: Bowel or bladder incontinence: No Spinal tumors: No Cauda equina syndrome: No Compression fracture: No Abdominal aneurysm: No  COGNITION:  Overall cognitive  status: Within functional limits for tasks assessed     SENSATION: WFL  MUSCLE LENGTH: Hamstrings: Right 50 deg; Left 60  deg Marcello Moores test: Right pos ; Left pos   POSTURE: increased lumbar lordosis   LUMBAR ROM:   Active  A/PROM  eval A/PROM 04/08/22  Flexion WNL WNL  Extension WNL WNL  Right lateral flexion Fingertips to distal thigh Fingertips to joint line  Left lateral flexion Fingertips to joint line Fingertips to joint line  Right rotation WNL WNL  Left rotation WNL WNL   (Blank rows = not tested)  LOWER EXTREMITY ROM:     WFL  LOWER EXTREMITY MMT:    Generally 4-/5 bilateral LE's 04/08/22: improved to 4/5 bilateral LE's  LUMBAR SPECIAL TESTS:  Straight leg raise test: Negative, Slump test: Negative, Stork standing: not tested, and FABER test: Negative  FUNCTIONAL TESTS:   Eval: 5 times sit to stand: test next visit Timed up and go (TUG): test next visit  02/21/2022: 5 times sit to stand: 14.6 sec with minimal UE pushing up from thighs Timed up and go (TUG):  9.5 sec without assistive device 3 min walk test:  730 ft without assistive device  04/08/2022: 5 times sit to stand: 10.42 sec without UE use Timed up and go (TUG):  7.18 sec without assistive device 3 min walk test:  885 ft without assistive device  GAIT: Eval: Distance walked: 50 Assistive device utilized: None Level of assistance: Complete Independence Comments: antalgic, guarded  Distance walked: 885 Assistive device utilized: None Level of assistance: Complete Independence Comments: normal heel to toe progression with rapid gait speed and no guarding    TODAY'S TREATMENT  Date: 04/18/22 Nustep x 5 min level 5 PPT x 20  PPT with 90/90 heel tap x 20 (minimal verbal and tactile cues to maintain PPT) PPT with dying bug 2 x 10 (patient doing well with sequencing) PPT with SLR to yellow plyo ball shoulder flexion x 20 each LE Star Crunches x 10 each side Supine lower trunk rotation x  20 Seated Russian twists x 20 (10 each side) Seated on blue physio ball PPT Seated on blue physio ball PPT with alternating arms x 20 Seated on blue physio ball PPT with march x 20 (heavy verbal and tactile cues for patient to maintain neutral pelvis) Seated ball rolls fwd, left and right x 10 each direction  Date: 04/08/22 Recumbent bike x 5 min level 1 Re-assessment to determine re-cert: see objective findings above Reviewed all of HEP and added Star Crunch to HEP  Date: 04/03/22 Nustep x 5 min level 5 PPT x 20 (no need for vc's for engaging TA today) PPT with 90/90 heel tap x 20 (minimal verbal and tactile cues to maintain PPT) PPT with dying bug 2 x 10 (patient doing well with sequencing) PPT with SLR to yellow plyo ball shoulder flexion x 20 each LE Supine lower trunk rotation x 20 PPT reverse sit up attempted - patient could not engage TA enough to tolerate PPT with hollow hold upper extremity reach toward ceiling.     PATIENT EDUCATION:  Education details: Initiated HEP Person educated: Patient Education method: Consulting civil engineer, Media planner, Verbal cues, and Handouts Education comprehension: verbalized understanding, returned demonstration, and verbal cues required   HOME EXERCISE PROGRAM:  Access Code: [email or web address] URL: [email or web address] Date: 03/20/2022 Prepared by: Candyce Churn  Exercises - Supine Hamstring Stretch with Strap  - 1 x daily - 7 x weekly - 1 sets - 3 reps - 30 sec hold - Supine ITB Stretch with Strap  - 1 x daily - 7 x weekly - 1 sets - 3  reps - 30 sec hold - Supine Piriformis Stretch Pulling Heel to Hip  - 1 x daily - 7 x weekly - 1 sets - 3 reps - 30 sec hold - Supine Posterior Pelvic Tilt  - 1 x daily - 7 x weekly - 1 sets - 20 reps - Standing Quad Stretch with Table and Chair Support  - 1 x daily - 7 x weekly - 1 sets - 3 reps - 30 sec hold - Seated Piriformis Stretch  - 1 x daily - 7 x weekly - 1 sets - 3 reps - 20 sec hold -  Seated Hamstring Stretch  - 2 x daily - 7 x weekly - 1 sets - 3 reps - 20 hold - Sidelying Open Book Thoracic Lumbar Rotation and Extension  - 2 x daily - 7 x weekly - 1 sets - 10 reps - Clamshell  - 1 x daily - 7 x weekly - 2 sets - 10 reps - Prone Quadriceps Stretch with Strap  - 1 x daily - 7 x weekly - 1 sets - 3 reps - 30 sec hold - Supine 90/90 Alternating Heel Touches with Posterior Pelvic Tilt  - 1 x daily - 7 x weekly - 2 sets - 10 reps - Supine Dead Bug with Leg Extension  - 1 x daily - 7 x weekly - 2 sets - 10 reps - Reverse Crunch with Mini Swiss Ball  - 1 x daily - 7 x weekly - 2 sets - 10 repsAccess Code: [email or web address] URL: [email or web address] Date: 02/28/2022 Prepared by: Shelby Dubin Menke  04/08/22: added star crunch and provided handout (not able to locate in med bridge) 2 x 10 each side once daily    ASSESSMENT:  CLINICAL IMPRESSION: Ms Michaux is doing well and needing minimal verbal cues on her existing exercises.  She had difficulty performing PPT seated on table and on ball and needed heavy verbal and tactile cues.  However, she was able to do hook lying PPT without verbal cues independently.  She would benefit from continuing skilled PT to monitor symptoms and progress core exercises.  She will likely end her therapy at end of calendar year as her deductible starts over.  She hopes to meet her final goals by that time.   Pt continues to require skilled PT to progress towards goal related activities.   OBJECTIVE IMPAIRMENTS Abnormal gait, decreased mobility, difficulty walking, decreased ROM, decreased strength, increased fascial restrictions, increased muscle spasms, impaired flexibility, postural dysfunction, obesity, and pain.   ACTIVITY LIMITATIONS carrying, lifting, bending, sitting, standing, squatting, sleeping, stairs, transfers, bed mobility, and dressing  PARTICIPATION LIMITATIONS: meal prep, cleaning, laundry, driving, shopping, community activity,  and yard work  PERSONAL FACTORS Behavior pattern, Fitness, Past/current experiences, Time since onset of injury/illness/exacerbation, and 1-2 comorbidities: chronic pain, RA  are also affecting patient's functional outcome.   REHAB POTENTIAL: Good  CLINICAL DECISION MAKING: Evolving/moderate complexity  EVALUATION COMPLEXITY: Moderate   GOALS: Goals reviewed with patient? Yes  SHORT TERM GOALS: Target date: 03/11/2022  Pain report to be no greater than 4/10  Baseline: Goal status: MET  2.  Patient will be independent with initial HEP  Baseline:  Goal status: MET  3.  Patient to be able to stand or walk for at least 15 min without leg pain  Baseline:  Goal status: MET   LONG TERM GOALS: Target date: 04/08/2022  Patient to report pain no greater than 2/10  Baseline:  Goal status: MET  2.  Patient to be independent with advanced HEP  Baseline:  Goal status: IN PROGRESS  3.  Patient to be able to stand or walk for at least 30 min without pain Baseline:  Goal status: IN PROGRESS  4.  FOTO to be 71%. Baseline: 57% Goal status: IN PROGRESS  5.  5 times sit to stand to improve by 2 seconds Baseline: 14.6 sec on 02/21/22 Goal status: MET  6.  TUG test to improve by 2 seconds Baseline:  9.5 sec on 02/21/22 Goal status: MET   PLAN: PT FREQUENCY: 2x/week  PT DURATION: 8 weeks  PLANNED INTERVENTIONS: Therapeutic exercises, Therapeutic activity, Neuromuscular re-education, Balance training, Gait training, Patient/Family education, Self Care, Joint mobilization, Stair training, DME instructions, Aquatic Therapy, Dry Needling, Electrical stimulation, Spinal mobilization, Cryotherapy, Moist heat, Taping, Traction, Ultrasound, Ionotophoresis 14m/ml Dexamethasone, Manual therapy, and Re-evaluation.  PLAN FOR NEXT SESSION:  Patient will likely end at end of calendar year as her deductible starts over.  We will focus on continuing flexibility and progressing core strength.       JAnderson MaltaB. Rohini Jaroszewski, PT 04/18/22 10:13 AM   BChalmers P. [facility] [address] SLincolnwoodGCasper [place] [address]Phone # [phone] [phone]

## 2022-04-23 ENCOUNTER — Ambulatory Visit: Payer: BC Managed Care – PPO | Admitting: Psychology

## 2022-04-23 DIAGNOSIS — F4322 Adjustment disorder with anxiety: Secondary | ICD-10-CM | POA: Diagnosis not present

## 2022-04-23 NOTE — Progress Notes (Signed)
04/23/2022  Treatrment Plan:  Diagnosis F41.1 (Generalized anxiety disorder) [n/a]  Z62.820 (Parent child relationship problem) [n/a]  Symptoms Excessive and/or unrealistic worry that is difficult to control occurring more days than not for at least 6 months about a number of events or activities. (Status: maintained) -- No Description Entered  Regularly overindulges their child's wishes and demands. (Status: maintained) -- No Description Entered  Medication Status compliance  Safety none  If Suicidal or Homicidal State Action Taken: unspecified  Current Risk: low Medications unspecified Objectives Related Problem: Achieve a level of competent, effective parenting. Description: Increase the gradual letting go of their adolescent in constructive, affirmative ways. Target Date: 2022-11-13 Frequency: Daily Modality: individual Progress: 80%  Related Problem: Achieve a level of competent, effective parenting. Description: Identify unresolved childhood issues that affect parenting and work toward their resolution. Target Date: 2022-11-13 Frequency: Daily Modality: individual Progress: 80%  Related Problem: Achieve a level of competent, effective parenting. Description: Freely express feelings of frustration, helplessness, and inadequacy that each experiences in the parenting role. Target Date: 2022-11-13 Frequency: Daily Modality: individual Progress: 80%  Related Problem: Resolve the core conflict that is the source of anxiety. Description: Learn and implement problem-solving strategies for realistically addressing worries. Target Date: 2022-11-13 Frequency: Daily Modality: individual Progress: 75%  Related Problem: Resolve the core conflict that is the source of anxiety. Description: Learn and implement calming skills to reduce overall anxiety and manage anxiety symptoms. Target Date: 2022-11-13 Frequency: Daily Modality: individual Progress: 70%  Related Problem:  Resolve the core conflict that is the source of anxiety. Description: Describe situations, thoughts, feelings, and actions associated with anxieties and worries, their impact on functioning, and attempts to resolve them. Target Date: 2022-11-13 Frequency: Daily Modality: individual Progress: 70%  Client Response full compliance  Service Location Location, [address] [place] [address]  Service Code cpt [phone]  Behavioral activation plan  Facilitate problem solving  Identify automatic thoughts  Emotion regulation skills  Provide education, information  Self care activities  Relaxation training  Lifestyle change (exercise, nutrition)  Self-monitoring  Validate/empathize  Session notes: F 41.1  Goals: She has chronic anxieties, mostly related to health concerns and her son's well-being. Would like to develop strategies to manage symptoms of anxiety. Needs to understand enmeshment and learn to define and manage appropriate family boundaries. Target date is 12-23. Patient has realized significant improvement in recognizing and establishing appropriate boundaries. Anxiety about health and family matters persist and she desires to continue treatment in an effort to manage and reduce these feelings. Revised target date is 6-24.  Meds: Xanax (.25 mg);   Patient agrees to a video session. She is at home and I am in my home office.  Virga and [name]: [name] declined the job offer from [employer]. Ruthe is concerned that he will not find something else. He has not had any second thoughts.  He met with psychiatrist again and no medicine dosage changes. She emphasized that he needs to start taking more "risks" and medicine can be increased if needed. She told him he has a "text book case of anxiety". Zakeya is pleased [name] is coming home next week. She is worried about his level of anxiety and is enabling his fears. Told him he needs to see a cognitive behavioral therapist asap  to address his anxiety. He agrees to call to make an appointment. Mother supports this decision.  Marcelina Morel, PhD 2:15p-3:00p 45 min

## 2022-04-28 ENCOUNTER — Ambulatory Visit (INDEPENDENT_AMBULATORY_CARE_PROVIDER_SITE_OTHER): Payer: BC Managed Care – PPO

## 2022-04-28 DIAGNOSIS — Z7901 Long term (current) use of anticoagulants: Secondary | ICD-10-CM | POA: Diagnosis not present

## 2022-04-28 LAB — POCT INR: INR: 1.6 — AB (ref 2.0–3.0)

## 2022-04-28 NOTE — Patient Instructions (Addendum)
Pre visit review using our clinic review tool, if applicable. No additional management support is needed unless otherwise documented below in the visit note.  Continue  5 mg (2 tablets) daily except take 2.5 mg (1 tablet) on Sundays and take 3.75 mg (1 1/2 tablet) on Thursdays. Recheck in 4 weeks.  

## 2022-04-28 NOTE — Progress Notes (Unsigned)
Continue  5 mg (2 tablets) daily except take 2.5 mg (1 tablet) on Sundays and take 3.75 mg (1 1/2 tablet) on Thursdays. Recheck in 4 weeks.  

## 2022-04-30 ENCOUNTER — Other Ambulatory Visit: Payer: Self-pay | Admitting: Physical Medicine and Rehabilitation

## 2022-04-30 DIAGNOSIS — M5459 Other low back pain: Secondary | ICD-10-CM

## 2022-04-30 DIAGNOSIS — M546 Pain in thoracic spine: Secondary | ICD-10-CM

## 2022-05-01 ENCOUNTER — Ambulatory Visit: Payer: BC Managed Care – PPO

## 2022-05-01 DIAGNOSIS — R293 Abnormal posture: Secondary | ICD-10-CM

## 2022-05-01 DIAGNOSIS — M5459 Other low back pain: Secondary | ICD-10-CM | POA: Diagnosis not present

## 2022-05-01 DIAGNOSIS — M6281 Muscle weakness (generalized): Secondary | ICD-10-CM

## 2022-05-01 DIAGNOSIS — R262 Difficulty in walking, not elsewhere classified: Secondary | ICD-10-CM

## 2022-05-01 DIAGNOSIS — R252 Cramp and spasm: Secondary | ICD-10-CM

## 2022-05-01 NOTE — Therapy (Addendum)
OUTPATIENT PHYSICAL THERAPY TREATMENT NOTE PHYSICAL THERAPY DISCHARGE SUMMARY  Visits from Start of Care: 14  Current functional level related to goals / functional outcomes: See below   Remaining deficits: See below   Education / Equipment: See below   Patient agrees to discharge. Patient goals were partially met. Patient is being discharged due to not returning since the last visit.    Patient Name: [name] MRN: [id number] DOB:19-Feb-1961, 61 y.o., female Today's Date: 05/01/2022   PT End of Session - 05/01/22 1106     Visit Number 14    Date for PT Re-Evaluation 06/03/22    Authorization Type BCBS    PT Start Time 1100    PT Stop Time 1145    PT Time Calculation (min) 45 min    Activity Tolerance Patient tolerated treatment well    Behavior During Therapy WFL for tasks assessed/performed              Past Medical History:  Diagnosis Date   Adjustment disorder with anxiety    B12 deficiency due to diet    is a vegetarian   Chest pain, unspecified    Chronic anticoagulation    Fatty liver    Per patient   GERD (gastroesophageal reflux disease)    Headache(784.0)    migraines and head pain   Hx of varicella    As Child   Iron deficiency anemia, unspecified    Otalgia, unspecified    Other and unspecified hyperlipidemia    Palpitations    Personal history of venous thrombosis and embolism    Recurrent UTI    Rheumatoid arthritis(714.0)    deveshewar   Unspecified vitamin D deficiency    UTI (lower urinary tract infection) 11/28/2011   citrobacter      Past Surgical History:  Procedure Laterality Date   ORIF ULNAR FRACTURE     TYMPANOSTOMY TUBE PLACEMENT  2009   Right   Patient Active Problem List   Diagnosis Date Noted   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[place]) 01/06/2018   Long term (current) use of anticoagulants 02/11/2017   Sicca syndrome ([place]) 12/22/2016   ANA positive 12/22/2016   Raised intraocular pressure of both eyes  12/22/2016   Migraine 03/31/2016   Anxiety 03/31/2016   High risk medication use 03/31/2016   Primary osteoarthritis of both feet 03/31/2016   Primary osteoarthritis of both knees 03/31/2016   Stiffness of left hand joint 11/27/2014   Elevated IOP 06/09/2014   Long term current use of systemic steroids 06/09/2014   Medically complex patient 06/09/2014   Current use of steroid medication 01/09/2014   Xerosis of skin 12/07/2013   Encounter for therapeutic drug monitoring 06/09/2013   B12 deficiency 05/31/2013   Iron deficiency 05/31/2013   Visit for preventive health examination 05/25/2012   Mammogram abnormal 04/12/2012   Iliac crest  pain 01/23/2012   Vaginal atrophy 01/23/2012   History of recurrent UTI (urinary tract infection) 01/23/2012   Osteoporosis 04/29/2011   Nonspecific abnormal results of liver function study 12/08/2010   Chronic anticoagulation    Anticoagulant long-term use 07/15/2010   Anticardiolipin antibody positive 07/15/2010   Chronic cough 03/19/2009   CHEST PAIN-UNSPECIFIED 08/26/2008   PALPITATIONS, RECURRENT 07/24/2008   Vitamin D deficiency 05/30/2008   HYPERLIPIDEMIA 05/30/2008   IRON DEFICIENCY 05/30/2008   Adjustment disorder with anxiety 05/30/2008   Rheumatoid arthritis ([place]) 05/30/2008   PULMONARY EMBOLISM, HX OF 05/30/2008    PCP: Burnis Medin, MD   REFERRING  PROVIDER: Bo Merino, MD   REFERRING DIAG: M51.36 (ICD-10-CM) - DDD (degenerative disc disease), lumbar M54.50 (ICD-10-CM) - Acute midline low back pain without sciatica   Rationale for Evaluation and Treatment Rehabilitation  THERAPY DIAG:  Other low back pain  Abnormal posture  Cramp and spasm  Muscle weakness (generalized)  Difficulty in walking, not elsewhere classified  ONSET DATE: 01/30/2022   SUBJECTIVE:                                                                                                                                                                                            SUBJECTIVE STATEMENT: Pt reports she is doing good.  The discomfort is still there at times but very minimal.  Pain reported at 1/10.       PERTINENT HISTORY:  Most recent MD note:  Lavita Hoblit is a 61 y.o. female with history of seronegative rheumatoid arthritis.  According the patient about 2 weeks ago she started having lower back pain which gradually got worse.  She did not want to go to the emergency room as she was concerned about COVID-19 virus exposure.  She was seen by Dr.Xu on January 28, 2022.  She had x-rays of her lumbar spine which showed some degenerative changes.  She was offered a prednisone taper which she declined.  He also gave her prescription for Flexeril 5 mg which she has not started yet.  She states the Lidoderm patches were not covered by her insurance.  She continues to have lower back pain.  None of the other joints are painful or swollen.  She states that she had some discomfort in her left heel which comes and goes.  She has been taking methotrexate 8 tablets p.o. weekly and hydroxychloroquine 1 tablet p.o. twice daily Monday to Friday  PAIN:  Are you having pain? Yes: NPRS scale: not even a 1 (like a .25)/10 Pain location: low back Pain description: aching Aggravating factors: standing, sitting Relieving factors: heat prednisone dose pack   PRECAUTIONS: None  WEIGHT BEARING RESTRICTIONS No  FALLS:  Has patient fallen in last 6 months? No  LIVING ENVIRONMENT: Lives with: lives with their spouse Lives in: House/apartment Stairs: Yes: Internal: 12 steps; can reach both Has following equipment at home: None  OCCUPATION: Retired  PLOF: Independent, Independent with basic ADLs, Independent with household mobility without device, Independent with community mobility without device, Independent with homemaking with ambulation, Independent with gait, and Independent with transfers  PATIENT GOALS "I just want to be pain  free"   OBJECTIVE:   DIAGNOSTIC FINDINGS:  Xray 01/28/22 Mild degenerative changes L5-S1.  Mild facet disease  PATIENT SURVEYS:  FOTO 57 goal is 51 04/08/22: 47  SCREENING FOR RED FLAGS: Bowel or bladder incontinence: No Spinal tumors: No Cauda equina syndrome: No Compression fracture: No Abdominal aneurysm: No  COGNITION:  Overall cognitive status: Within functional limits for tasks assessed     SENSATION: WFL  MUSCLE LENGTH: Hamstrings: Right 50 deg; Left 60 deg Thomas test: Right pos ; Left pos   POSTURE: increased lumbar lordosis   LUMBAR ROM:   Active  A/PROM  eval A/PROM 04/08/22  Flexion WNL WNL  Extension WNL WNL  Right lateral flexion Fingertips to distal thigh Fingertips to joint line  Left lateral flexion Fingertips to joint line Fingertips to joint line  Right rotation WNL WNL  Left rotation WNL WNL   (Blank rows = not tested)  LOWER EXTREMITY ROM:     WFL  LOWER EXTREMITY MMT:    Generally 4-/5 bilateral LE's 04/08/22: improved to 4/5 bilateral LE's  LUMBAR SPECIAL TESTS:  Straight leg raise test: Negative, Slump test: Negative, Stork standing: not tested, and FABER test: Negative  FUNCTIONAL TESTS:   Eval: 5 times sit to stand: test next visit Timed up and go (TUG): test next visit  02/21/2022: 5 times sit to stand: 14.6 sec with minimal UE pushing up from thighs Timed up and go (TUG):  9.5 sec without assistive device 3 min walk test:  730 ft without assistive device  04/08/2022: 5 times sit to stand: 10.42 sec without UE use Timed up and go (TUG):  7.18 sec without assistive device 3 min walk test:  885 ft without assistive device  GAIT: Eval: Distance walked: 50 Assistive device utilized: None Level of assistance: Complete Independence Comments: antalgic, guarded  Distance walked: 885 Assistive device utilized: None Level of assistance: Complete Independence Comments: normal heel to toe progression with rapid gait  speed and no guarding    TODAY'S TREATMENT  Date: 05/01/22 Nustep x 5 min level 5 PPT x 20  PPT with 90/90 heel tap x 20 (minimal verbal and tactile cues to maintain PPT) PPT with dying bug 2 x 10 (patient doing well with sequencing) PPT with SLR to yellow plyo ball shoulder flexion x 20 each LE 5 min of patient question and answer :  Patient inquiring about injections that the doctor has suggested - she states these injections are not steroids, also wanted to know if there was a "quick fix" for when she is hurting.  Star Crunches x 10 each side Supine lower trunk rotation x 20 Seated Russian twists x 20 (10 each side) Seated on green physio ball PPT Seated on green physio ball PPT with alternating arms x 20 Seated on green physio ball PPT with march x 20 (heavy verbal and tactile cues for patient to maintain neutral pelvis) Seated ball rolls fwd, left and right x 10 each direction with green physio ball (verbal cues for slowing down to allow the stretch)  Date: 04/18/22 Nustep x 5 min level 5 PPT x 20  PPT with 90/90 heel tap x 20 (minimal verbal and tactile cues to maintain PPT) PPT with dying bug 2 x 10 (patient doing well with sequencing) PPT with SLR to yellow plyo ball shoulder flexion x 20 each LE Star Crunches x 10 each side Supine lower trunk rotation x 20 Seated Russian twists x 20 (10 each side) Seated on blue physio ball PPT Seated on blue physio ball PPT with alternating arms x 20 Seated on blue physio ball PPT with march  x 20 (heavy verbal and tactile cues for patient to maintain neutral pelvis) Seated ball rolls fwd, left and right x 10 each direction  Date: 04/08/22 Recumbent bike x 5 min level 1 Re-assessment to determine re-cert: see objective findings above Reviewed all of HEP and added Star Crunch to HEP  Date: 04/03/22 Nustep x 5 min level 5 PPT x 20 (no need for vc's for engaging TA today) PPT with 90/90 heel tap x 20 (minimal verbal and tactile cues to  maintain PPT) PPT with dying bug 2 x 10 (patient doing well with sequencing) PPT with SLR to yellow plyo ball shoulder flexion x 20 each LE Supine lower trunk rotation x 20 PPT reverse sit up attempted - patient could not engage TA enough to tolerate PPT with hollow hold upper extremity reach toward ceiling.     PATIENT EDUCATION:  Education details: Educated on dry needling and handout provided, explained to patient that we had not offered this as she is quite apprehensive with a lot of simple procedures and she needed a lot of emphasis on gaining confidence with her HEP and doing these correctly.  Handout provided and explained that if she would like to try this, we can certainly do this in the next few sessions to see how she responds.   Person educated: Patient Education method: Explanation, Demonstration, Verbal cues, and Handouts Education comprehension: verbalized understanding, returned demonstration, and verbal cues required   HOME EXERCISE PROGRAM:  Access Code: [email or web address] URL: [email or web address] Date: 03/20/2022 Prepared by: Candyce Churn  Exercises - Supine Hamstring Stretch with Strap  - 1 x daily - 7 x weekly - 1 sets - 3 reps - 30 sec hold - Supine ITB Stretch with Strap  - 1 x daily - 7 x weekly - 1 sets - 3 reps - 30 sec hold - Supine Piriformis Stretch Pulling Heel to Hip  - 1 x daily - 7 x weekly - 1 sets - 3 reps - 30 sec hold - Supine Posterior Pelvic Tilt  - 1 x daily - 7 x weekly - 1 sets - 20 reps - Standing Quad Stretch with Table and Chair Support  - 1 x daily - 7 x weekly - 1 sets - 3 reps - 30 sec hold - Seated Piriformis Stretch  - 1 x daily - 7 x weekly - 1 sets - 3 reps - 20 sec hold - Seated Hamstring Stretch  - 2 x daily - 7 x weekly - 1 sets - 3 reps - 20 hold - Sidelying Open Book Thoracic Lumbar Rotation and Extension  - 2 x daily - 7 x weekly - 1 sets - 10 reps - Clamshell  - 1 x daily - 7 x weekly - 2 sets - 10 reps - Prone  Quadriceps Stretch with Strap  - 1 x daily - 7 x weekly - 1 sets - 3 reps - 30 sec hold - Supine 90/90 Alternating Heel Touches with Posterior Pelvic Tilt  - 1 x daily - 7 x weekly - 2 sets - 10 reps - Supine Dead Bug with Leg Extension  - 1 x daily - 7 x weekly - 2 sets - 10 reps - Reverse Crunch with Mini Swiss Ball  - 1 x daily - 7 x weekly - 2 sets - 10 repsAccess Code: [email or web address] URL: [email or web address] Date: 02/28/2022 Prepared by: Shelby Dubin Menke  04/08/22: added star crunch and provided handout (not able to locate in med bridge) 2 x 10 each  side once daily    ASSESSMENT:  CLINICAL IMPRESSION: Ms Gaur is progressing toward final goals.  She continues to have episodes of elevated pain but does seem to be able to manage this with exercises.  She continues to seek out what she refers to as a "quick fix".  We spent a few minutes explaining that back issues typically are something we have to manage and when we are hurting, sometimes there is no "quick fix".  Medication is an option but suggested she try running through her exercises when she feels the pain increasing.  She also inquired about dry needling.  PT explained to patient that we had not suggested this to her due to her apprehensions about even simple conservative approaches but that we would be happy to provide the information and let her read over the specifics.  If she would like to try this at any point in her next few visits, we would be glad to try this.  She has a few visits left.    Pt continues to require skilled PT to progress towards goal related activities.   OBJECTIVE IMPAIRMENTS Abnormal gait, decreased mobility, difficulty walking, decreased ROM, decreased strength, increased fascial restrictions, increased muscle spasms, impaired flexibility, postural dysfunction, obesity, and pain.   ACTIVITY LIMITATIONS carrying, lifting, bending, sitting, standing, squatting, sleeping, stairs, transfers, bed  mobility, and dressing  PARTICIPATION LIMITATIONS: meal prep, cleaning, laundry, driving, shopping, community activity, and yard work  PERSONAL FACTORS Behavior pattern, Fitness, Past/current experiences, Time since onset of injury/illness/exacerbation, and 1-2 comorbidities: chronic pain, RA  are also affecting patient's functional outcome.   REHAB POTENTIAL: Good  CLINICAL DECISION MAKING: Evolving/moderate complexity  EVALUATION COMPLEXITY: Moderate   GOALS: Goals reviewed with patient? Yes  SHORT TERM GOALS: Target date: 03/11/2022  Pain report to be no greater than 4/10  Baseline: Goal status: MET  2.  Patient will be independent with initial HEP  Baseline:  Goal status: MET  3.  Patient to be able to stand or walk for at least 15 min without leg pain  Baseline:  Goal status: MET   LONG TERM GOALS: Target date: 04/08/2022  Patient to report pain no greater than 2/10  Baseline:  Goal status: MET  2.  Patient to be independent with advanced HEP  Baseline:  Goal status: IN PROGRESS  3.  Patient to be able to stand or walk for at least 30 min without pain Baseline:  Goal status: IN PROGRESS  4.  FOTO to be 71%. Baseline: 57% Goal status: IN PROGRESS  5.  5 times sit to stand to improve by 2 seconds Baseline: 14.6 sec on 02/21/22 Goal status: MET  6.  TUG test to improve by 2 seconds Baseline:  9.5 sec on 02/21/22 Goal status: MET   PLAN: PT FREQUENCY: 2x/week  PT DURATION: 8 weeks  PLANNED INTERVENTIONS: Therapeutic exercises, Therapeutic activity, Neuromuscular re-education, Balance training, Gait training, Patient/Family education, Self Care, Joint mobilization, Stair training, DME instructions, Aquatic Therapy, Dry Needling, Electrical stimulation, Spinal mobilization, Cryotherapy, Moist heat, Taping, Traction, Ultrasound, Ionotophoresis 4m/ml Dexamethasone, Manual therapy, and Re-evaluation.  PLAN FOR NEXT SESSION:  Patient will likely end at end  of calendar year as her deductible starts over.  We will focus on continuing flexibility and progressing core strength.   DN if patient feels comfortable with this and requests this.     JAnderson MaltaB. Adar Rase, PT 05/01/22 2:30 PM   [id number] [place] [address]  Phone # [phone] Fax [phone]

## 2022-05-06 ENCOUNTER — Ambulatory Visit: Payer: BC Managed Care – PPO

## 2022-05-07 ENCOUNTER — Ambulatory Visit (INDEPENDENT_AMBULATORY_CARE_PROVIDER_SITE_OTHER): Payer: BC Managed Care – PPO | Admitting: Psychology

## 2022-05-07 DIAGNOSIS — F4322 Adjustment disorder with anxiety: Secondary | ICD-10-CM

## 2022-05-07 NOTE — Progress Notes (Signed)
05/07/2022  Treatrment Plan:  Diagnosis F41.1 (Generalized anxiety disorder) [n/a]  Z62.820 (Parent child relationship problem) [n/a]  Symptoms Excessive and/or unrealistic worry that is difficult to control occurring more days than not for at least 6 months about a number of events or activities. (Status: maintained) -- No Description Entered  Regularly overindulges their child's wishes and demands. (Status: maintained) -- No Description Entered  Medication Status compliance  Safety none  If Suicidal or Homicidal State Action Taken: unspecified  Current Risk: low Medications unspecified Objectives Related Problem: Achieve a level of competent, effective parenting. Description: Increase the gradual letting go of their adolescent in constructive, affirmative ways. Target Date: 2022-11-13 Frequency: Daily Modality: individual Progress: 80%  Related Problem: Achieve a level of competent, effective parenting. Description: Identify unresolved childhood issues that affect parenting and work toward their resolution. Target Date: 2022-11-13 Frequency: Daily Modality: individual Progress: 80%  Related Problem: Achieve a level of competent, effective parenting. Description: Freely express feelings of frustration, helplessness, and inadequacy that each experiences in the parenting role. Target Date: 2022-11-13 Frequency: Daily Modality: individual Progress: 80%  Related Problem: Resolve the core conflict that is the source of anxiety. Description: Learn and implement problem-solving strategies for realistically addressing worries. Target Date: 2022-11-13 Frequency: Daily Modality: individual Progress: 75%  Related Problem: Resolve the core conflict that is the source of anxiety. Description: Learn and implement calming skills to reduce overall anxiety and manage anxiety symptoms. Target Date: 2022-11-13 Frequency: Daily Modality:  individual Progress: 70%  Related Problem: Resolve the core conflict that is the source of anxiety. Description: Describe situations, thoughts, feelings, and actions associated with anxieties and worries, their impact on functioning, and attempts to resolve them. Target Date: 2022-11-13 Frequency: Daily Modality: individual Progress: 70%  Client Response full compliance  Service Location Location, [address] [place], [place] [address]  Service Code cpt [phone]  Behavioral activation plan  Facilitate problem solving  Identify automatic thoughts  Emotion regulation skills  Provide education, information  Self care activities  Relaxation training  Lifestyle change (exercise, nutrition)  Self-monitoring  Validate/empathize  Session notes: F 41.1  Goals: She has chronic anxieties, mostly related to health concerns and her son's well-being. Would like to develop strategies to manage symptoms of anxiety. Needs to understand enmeshment and learn to define and manage appropriate family boundaries. Target date is 12-23. Patient has realized significant improvement in recognizing and establishing appropriate boundaries. Anxiety about health and family matters persist and she desires to continue treatment in an effort to manage and reduce these feelings. Revised target date is 6-24.  Meds: Xanax (.25 mg);   Patient agrees to a video session. She is at home and I am in my home office.  Brendalee and [name]: [name] is home and is applying to various jobs. Zamorah is doing better at stepping away of too much control. She is still, however, emotionally enmeshed. She acknowledges that her moods are parallel to [employer] ("when he is happy, I am happy and when he is upset or sad, I am as well"). Talked about the pattern of [name]'s life of school/work with brief intermittent "vacations" home to see doctors. Even on vacation, he would work to catch up and/or get ahead. Addressed the Covid  anxieties that distates their behavior.  Marcelina Morel, PhD 2:15p-3:00p 45 min

## 2022-05-08 ENCOUNTER — Ambulatory Visit: Payer: BC Managed Care – PPO

## 2022-05-13 ENCOUNTER — Ambulatory Visit: Payer: BC Managed Care – PPO

## 2022-05-15 ENCOUNTER — Ambulatory Visit: Payer: BC Managed Care – PPO

## 2022-05-17 ENCOUNTER — Other Ambulatory Visit: Payer: BC Managed Care – PPO

## 2022-05-17 ENCOUNTER — Ambulatory Visit
Admission: RE | Admit: 2022-05-17 | Discharge: 2022-05-17 | Disposition: A | Discharge status: Home / Self Care | Payer: BC Managed Care – PPO | Source: Ambulatory Visit | Attending: Physical Medicine and Rehabilitation | Admitting: Physical Medicine and Rehabilitation

## 2022-05-17 DIAGNOSIS — M546 Pain in thoracic spine: Secondary | ICD-10-CM

## 2022-05-17 DIAGNOSIS — M5459 Other low back pain: Secondary | ICD-10-CM

## 2022-05-21 ENCOUNTER — Ambulatory Visit: Payer: BC Managed Care – PPO | Admitting: Psychology

## 2022-05-21 DIAGNOSIS — F4322 Adjustment disorder with anxiety: Secondary | ICD-10-CM

## 2022-05-21 NOTE — Progress Notes (Unsigned)
05/21/2022  Treatrment Plan:  Diagnosis F41.1 (Generalized anxiety disorder) [n/a]  Z62.820 (Parent child relationship problem) [n/a]  Symptoms Excessive and/or unrealistic worry that is difficult to control occurring more days than not for at least 6 months about a number of events or activities. (Status: maintained) -- No Description Entered  Regularly overindulges their child's wishes and demands. (Status: maintained) -- No Description Entered  Medication Status compliance  Safety none  If Suicidal or Homicidal State Action Taken: unspecified  Current Risk: low Medications unspecified Objectives Related Problem: Achieve a level of competent, effective parenting. Description: Increase the gradual letting go of their adolescent in constructive, affirmative ways. Target Date: 2022-11-13 Frequency: Daily Modality: individual Progress: 80%  Related Problem: Achieve a level of competent, effective parenting. Description: Identify unresolved childhood issues that affect parenting and work toward their resolution. Target Date: 2022-11-13 Frequency: Daily Modality: individual Progress: 80%  Related Problem: Achieve a level of competent, effective parenting. Description: Freely express feelings of frustration, helplessness, and inadequacy that each experiences in the parenting role. Target Date: 2022-11-13 Frequency: Daily Modality: individual Progress: 80%  Related Problem: Resolve the core conflict that is the source of anxiety. Description: Learn and implement problem-solving strategies for realistically addressing worries. Target Date: 2022-11-13 Frequency: Daily Modality: individual Progress: 75%  Related Problem: Resolve the core conflict that is the source of anxiety. Description: Learn and implement calming skills to reduce overall anxiety and manage anxiety symptoms. Target Date: 2022-11-13 Frequency:  Daily Modality: individual Progress: 70%  Related Problem: Resolve the core conflict that is the source of anxiety. Description: Describe situations, thoughts, feelings, and actions associated with anxieties and worries, their impact on functioning, and attempts to resolve them. Target Date: 2022-11-13 Frequency: Daily Modality: individual Progress: 70%  Client Response full compliance  Service Location Location, [address] [place], [place] [address]  Service Code cpt [phone]  Behavioral activation plan  Facilitate problem solving  Identify automatic thoughts  Emotion regulation skills  Provide education, information  Self care activities  Relaxation training  Lifestyle change (exercise, nutrition)  Self-monitoring  Validate/empathize  Session notes: F 41.1  Goals: She has chronic anxieties, mostly related to health concerns and her son's well-being. Would like to develop strategies to manage symptoms of anxiety. Needs to understand enmeshment and learn to define and manage appropriate family boundaries. Target date is 12-23. Patient has realized significant improvement in recognizing and establishing appropriate boundaries. Anxiety about health and family matters persist and she desires to continue treatment in an effort to manage and reduce these feelings. Revised target date is 6-24.  Meds: Xanax (.25 mg);   Patient agrees to a video session. She is at home and I am in my home office.  Juliann and [name]: [name] says the break has been good for him. He is, as usual, getting anxious for next semester. 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 Liyah has made attempts to calm him to no avail. He saw a neurologist during the break to address his jaw clinching. The doctor suggested [place]. We discussed the pros and cons of adding another medicine. He is leaning toward trying. He says that he has not yet heard back from any of his job  applications. He talked with his old boss about potential jobs  at [employer]. He is worried he will have less time to job hunt when the semester begins. We talked about whether it is a good idea for him to take time off befgore starting work. Told him it is a good idea only if he has a plan. Mother agrees and is encouraging medicine. Her anxiety is present, but improved.                                  Marcelina Morel, PhD 2:15p-3:00p 45 min

## 2022-05-23 ENCOUNTER — Encounter: Payer: Self-pay | Admitting: Internal Medicine

## 2022-05-23 ENCOUNTER — Other Ambulatory Visit: Payer: Self-pay

## 2022-05-23 MED ORDER — SUMATRIPTAN SUCCINATE 50 MG PO TABS: ORAL_TABLET | ORAL | 1 refills | Status: DC

## 2022-05-27 NOTE — Progress Notes (Signed)
Office Visit Note  Patient: [name]             Date of Birth: [birth year]           MRN: [id number]             PCP: Burnis Medin, MD Referring: Burnis Medin, MD Visit Date: 06/10/2022 Occupation: '@GUAROCC'$ @  Subjective:  Pain in multiple joints.  History of Present Illness: [name] is a 62 y.o. female tree of seronegative rheumatoid arthritis, degenerative disc disease and osteoarthritis.  She states she went to physical therapy after her last visit for her lower back pain.  She still had ongoing pain and discomfort.  She was evaluated at [facility] by Dr. Rolena Infante and was referred to Dr. Nelva Bush for evaluation.  The patient after doing MRI of the lumbar spine Dr. Nelva Bush recommended radiofrequency neurotomy.  She has been experiencing increased pain and discomfort in her knee joints.  She also has some discomfort in the left ankle joint.  She states the pain is worse in the morning when she gets up.  She has not seen any joint swelling.  She has been taking methotrexate and Plaquenil on a regular basis.  She states she takes Plaquenil 200 mg p.o. twice daily Monday to Friday.  She denies any history of eye inflammation.  There is no history of shortness of breath.  Activities of Daily Living:  Patient reports morning stiffness for 45 minutes.   Patient Reports nocturnal pain.  Difficulty dressing/grooming: Reports Difficulty climbing stairs: Denies Difficulty getting out of chair: Denies Difficulty using hands for taps, buttons, cutlery, and/or writing: Denies  Review of Systems  Constitutional:  Positive for fatigue.  HENT:  Negative for mouth sores and mouth dryness.   Eyes:  Positive for dryness.  Respiratory:  Negative for shortness of breath.   Cardiovascular:  Negative for chest pain and palpitations.  Gastrointestinal:  Positive for constipation. Negative for blood in stool and diarrhea.  Endocrine: Positive for increased urination.  Genitourinary:   Negative for involuntary urination.  Musculoskeletal:  Positive for joint pain, joint pain, myalgias, morning stiffness, muscle tenderness and myalgias. Negative for gait problem, joint swelling and muscle weakness.  Skin:  Negative for color change, rash and sensitivity to sunlight.  Allergic/Immunologic: Positive for susceptible to infections.  Neurological:  Positive for dizziness and tremors. Negative for headaches.  Hematological:  Negative for swollen glands.  Psychiatric/Behavioral:  Positive for sleep disturbance. Negative for depressed mood. The patient is not nervous/anxious.     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[place]) 01/06/2018   Long term (current) use of anticoagulants 02/11/2017   Sicca syndrome ([place]) 12/22/2016   ANA positive 12/22/2016   Raised intraocular pressure of both eyes 12/22/2016   Migraine 03/31/2016   Anxiety 03/31/2016   High risk medication use 03/31/2016   Primary osteoarthritis of both feet 03/31/2016   Primary osteoarthritis of both knees 03/31/2016   Stiffness of left hand joint 11/27/2014   Elevated IOP 06/09/2014   Long term current use of systemic steroids 06/09/2014   Medically complex patient 06/09/2014   Current use of steroid medication 01/09/2014   Xerosis of skin 12/07/2013   Encounter for therapeutic drug monitoring 06/09/2013   B12 deficiency 05/31/2013   Iron deficiency 05/31/2013   Visit for preventive health examination 05/25/2012   Mammogram abnormal 04/12/2012   Iliac crest  pain 01/23/2012   Vaginal  atrophy 01/23/2012   History of recurrent UTI (urinary tract infection) 01/23/2012   Osteoporosis 04/29/2011   Nonspecific abnormal results of liver function study 12/08/2010   Chronic anticoagulation    Anticoagulant long-term use 07/15/2010   Anticardiolipin antibody positive 07/15/2010   Chronic cough 03/19/2009   CHEST PAIN-UNSPECIFIED 08/26/2008    PALPITATIONS, RECURRENT 07/24/2008   Vitamin D deficiency 05/30/2008   HYPERLIPIDEMIA 05/30/2008   IRON DEFICIENCY 05/30/2008   Adjustment disorder with anxiety 05/30/2008   Rheumatoid arthritis ([place]) 05/30/2008   PULMONARY EMBOLISM, HX OF 05/30/2008    Past Medical History:  Diagnosis Date   Adjustment disorder with anxiety    B12 deficiency due to diet    is a vegetarian   Chest pain, unspecified    Chronic anticoagulation    Fatty liver    Per patient   GERD (gastroesophageal reflux disease)    Headache(784.0)    migraines and head pain   Hx of varicella    As Child   Iron deficiency anemia, unspecified    Otalgia, unspecified    Other and unspecified hyperlipidemia    Palpitations    Personal history of venous thrombosis and embolism    Recurrent UTI    Rheumatoid arthritis(714.0)    deveshewar   Unspecified vitamin D deficiency    UTI (lower urinary tract infection) 11/28/2011   citrobacter       Family History  Problem Relation Age of Onset   Parkinsonism Mother    Dementia Father    Stroke Father    Hyperlipidemia Father    Diabetes Father    Lymphoma Other    Colon cancer Neg Hx    Colon polyps Neg Hx    Esophageal cancer Neg Hx    Stomach cancer Neg Hx    Rectal cancer Neg Hx    Breast cancer Neg Hx    Past Surgical History:  Procedure Laterality Date   ORIF ULNAR FRACTURE     TYMPANOSTOMY TUBE PLACEMENT  2009   Right   Social History   Social History Narrative   Regular Exercise-no   20 yrs in [place]   Son at [employer] [facility]   From [place].   Married HH  of 3    G1P1            Immunization History  Administered Date(s) Administered   Influenza,inj,Quad PF,6+ Mos 04/19/2013, 06/27/2015, 03/05/2016, 02/11/2017, 03/15/2018, 02/23/2019, 04/02/2020, 03/13/2021, 02/26/2022   PFIZER(Purple Top)SARS-COV-2 Vaccination 08/06/2019, 08/25/2019, 01/12/2020, 11/10/2020   PPD Test 07/21/2016   Tdap 05/31/2013   Zoster Recombinat (Shingrix) 01/23/2021,  06/11/2021     Objective: Vital Signs: BP 111/74 (BP Location: Right Arm, Patient Position: Sitting, Cuff Size: Normal)   Pulse (!) 58   Resp 15   Ht 5' 3.5" (1.613 m)   Wt 156 lb 6.4 oz (70.9 kg)   BMI 27.27 kg/m    Physical Exam Vitals and nursing note reviewed.  Constitutional:      Appearance: She is well-developed.  HENT:     Head: Normocephalic and atraumatic.  Eyes:     Conjunctiva/sclera: Conjunctivae normal.  Cardiovascular:     Rate and Rhythm: Normal rate and regular rhythm.     Heart sounds: Normal heart sounds.  Pulmonary:     Effort: Pulmonary effort is normal.     Breath sounds: Normal breath sounds.  Abdominal:     General: Bowel sounds are normal.     Palpations: Abdomen is soft.  Musculoskeletal:     Cervical  back: Normal range of motion.  Lymphadenopathy:     Cervical: No cervical adenopathy.  Skin:    General: Skin is warm and dry.     Capillary Refill: Capillary refill takes less than 2 seconds.  Neurological:     Mental Status: She is alert and oriented to person, place, and time.  Psychiatric:        Behavior: Behavior normal.      Musculoskeletal Exam: She had good range of motion of the cervical spine.  She had painful limited range of motion of the lumbar spine.  Shoulder joints, elbow joints, wrist joints, MCPs PIPs and DIPs been good range of motion with no synovitis.  Hip joints and knee joints with good range of motion.  No warmth swelling or effusion was noted.  She had some tenderness over the lateral aspect of her ankle but no warmth was noted.  There was no tenderness over MTPs.  CDAI Exam: CDAI Score: -- Patient Global: 2 mm; Provider Global: 2 mm Swollen: --; Tender: -- Joint Exam 06/10/2022   No joint exam has been documented for this visit   There is currently no information documented on the homunculus. Go to the Rheumatology activity and complete the homunculus joint exam.  Investigation: No additional  findings.  Imaging: MR LUMBAR SPINE WO CONTRAST  Result Date: 05/21/2022 CLINICAL DATA:  Mid and lower back pain. EXAM: MRI LUMBAR SPINE WITHOUT CONTRAST TECHNIQUE: Multiplanar, multisequence MR imaging of the lumbar spine was performed. No intravenous contrast was administered. COMPARISON:  Lumbar spine radiographs 01/28/2022 FINDINGS: Segmentation:  Standard. Alignment: Mild lumbar levoscoliosis. Facet mediated anterolisthesis of L4 on L5 of 4 mm. Vertebrae: No fracture or suspicious marrow lesion. Prominent bilateral facet edema at L4-5. Conus medullaris and cauda equina: Conus extends to the L1 level. Conus and cauda equina appear normal. Paraspinal and other soft tissues: 3.4 cm right renal cyst with no follow-up imaging recommended. Disc levels: T12-L1 and L1-2: Negative. L2-3: Mild disc bulging without stenosis. L3-4: Mild disc desiccation and slight disc space narrowing. Minimal disc bulging without stenosis. L4-5: Disc desiccation and mild disc space narrowing. Anterolisthesis with bulging uncovered disc, moderate to severe facet arthrosis, and a 3 mm synovial cyst medial to the left facet joint result in mild right and moderate left lateral recess stenosis and mild bilateral neural foraminal stenosis without significant spinal stenosis. Potential left L5 nerve root impingement. L5-S1: Normal disc.  Mild facet arthrosis without stenosis. IMPRESSION: 1. Advanced L4-5 facet arthrosis with facet edema, grade 1 anterolisthesis, and mild right and moderate left lateral recess stenosis. 2. Mild disc bulging at L2-3 and L3-4 without stenosis. Electronically Signed   By: Logan Bores M.D.   On: 05/21/2022 09:07   MR THORACIC SPINE WO CONTRAST  Result Date: 05/21/2022 CLINICAL DATA:  Severe pain for 4 months EXAM: MRI THORACIC SPINE WITHOUT CONTRAST TECHNIQUE: Multiplanar, multisequence MR imaging of the thoracic spine was performed. No intravenous contrast was administered. COMPARISON:  None Available.  FINDINGS: Alignment:  Physiologic. Vertebrae: No fracture, evidence of discitis, or bone lesion. Cord:  Normal signal and morphology. Paraspinal and other soft tissues: Negative. Disc levels: There is small central disc extrusion at T10-T11 that contacts the ventral spinal cord without deformation or associated spinal canal stenosis. There is no evidence of high-grade spinal canal stenosis. No neural foraminal stenosis. IMPRESSION: 1. Small central disc extrusion at T10-T11 contacts the ventral spinal cord without deformation or associated spinal canal stenosis. 2.  No acute abnormality. Electronically Signed  By: Marin Roberts M.D.   On: 05/21/2022 09:04    Recent Labs: Lab Results  Component Value Date   WBC 6.9 01/30/2022   HGB 15.0 01/30/2022   PLT 259 01/30/2022   NA 140 01/30/2022   K 4.5 01/30/2022   CL 104 01/30/2022   CO2 28 01/30/2022   GLUCOSE 91 01/30/2022   BUN 10 01/30/2022   CREATININE 0.90 01/30/2022   BILITOT 0.7 01/30/2022   ALKPHOS 60 08/29/2020   AST 21 01/30/2022   ALT 16 01/30/2022   PROT 7.1 01/30/2022   ALBUMIN 3.7 08/29/2020   CALCIUM 9.4 01/30/2022   GFRAA 96 07/26/2020   QFTBGOLDPLUS NEGATIVE 09/12/2020    Speciality Comments: PLQ Eye Exam: 02/19/2022 WNL @ [facility] Follow up 1 year  Fosamax 11/21  Procedures:  No procedures performed Allergies: Boniva [ibandronate sodium], Ivp dye [iodinated contrast media], Other, Risedronate sodium, Risedronate sodium [risedronate sodium], and Macrobid [nitrofurantoin]   Assessment / Plan:     Visit Diagnoses: Rheumatoid arthritis of multiple sites with negative rheumatoid factor (HCC) - RF negative, anti-CCP positive, ANA positive.    She complains of pain and discomfort in her lower back, knee joints and her left ankle.  No synovitis was noted.  She has been taking hydroxychloroquine 200 mg p.o. twice daily Monday to Friday and methotrexate 8 tablets p.o. weekly without any interruption.  She denies any  side effects from the medications.  High risk medication use - Plaquenil 200 mg by mouth twice daily Monday to Friday, Methotrexate 8 tablets by mouth every week, and folic acid 2 mg by mouth daily. PLQ Eye Exam: 02/19/2022.  January 30, 2022 CBC and CMP were normal.  Sed rate was mildly elevated at 32.  CRP was normal.  Lab results were reviewed.  She will get labs today and every 3 months to monitor for drug toxicity.- Plan: CBC with Differential/Platelet, COMPLETE METABOLIC PANEL WITH GFR.  Information regarding immunization was placed in the AVS.  She was advised to hold methotrexate if she develops an infection resume after the infection resolves.  Sicca syndrome (HCC)-over-the-counter products were discussed.  Primary osteoarthritis of both knees-she continues to have some discomfort in her knee joints.  No warmth swelling or effusion was noted.  Knee x-rays on Sep 26, 2021 showed bilateral moderate osteoarthritis and moderate chondromalacia patella.  Lower extremity muscle strengthening exercises were emphasized.  Pain of left heel-she continues to have pain and discomfort in her left ankle and left heel.  She plans to see a podiatrist.  No synovitis was noted.  Primary osteoarthritis of both feet-she has osteoarthritis in her feet.  Proper fitting shoes were emphasized.  Chronic midline low back pain without sciatica-she has been experiencing increased lower back pain.  She saw Dr. Nelva Bush who recommended radiofrequency neurotomy.  Patient is not sure about the next step.  She tried physical therapy without much relief.  She is also tried muscle relaxers.  She is thinking of trying dry needling or seeing a neurosurgeon or back specialist for second opinion.  DDD (degenerative disc disease), lumbar-she brought MRI report from [facility] for my review.  Which showed multilevel spondylosis and facet joint arthropathy.  Age related osteoporosis, unspecified pathological fracture presence -  02/02/2020 T score  -2.8, BMD 0.649 g/cm2. alendronate 70 mg p.o. weekly since 03/2020. DEXA scan through her PCP.  DEXA is pending for August 11, 2022.  For the medical problems listed as follows:  Fatty liver  Raised intraocular pressure  of both eyes  History of pulmonary embolism-she is on warfarin.  History of hyperlipidemia  History of migraine  Frequent UTI  Orders: Orders Placed This Encounter  Procedures   CBC with Differential/Platelet   COMPLETE METABOLIC PANEL WITH GFR   Meds ordered this encounter  Medications   methotrexate (RHEUMATREX) 2.5 MG tablet    Sig: Take 8 tablets (20 mg total) by mouth once a week.    Dispense:  96 tablet    Refill:  0   hydroxychloroquine (PLAQUENIL) 200 MG tablet    Sig: Take '200mg'$  by mouth twice daily Monday through Friday only.    Dispense:  120 tablet    Refill:  0     Follow-Up Instructions: Return in about 3 months (around 09/09/2022) for Rheumatoid arthritis, Osteoarthritis.   Bo Merino, MD  Note - This record has been created using Editor, commissioning.  Chart creation errors have been sought, but may not always  have been located. Such creation errors do not reflect on  the standard of medical care.

## 2022-05-28 ENCOUNTER — Ambulatory Visit (INDEPENDENT_AMBULATORY_CARE_PROVIDER_SITE_OTHER): Payer: BC Managed Care – PPO

## 2022-05-28 DIAGNOSIS — Z7952 Long term (current) use of systemic steroids: Secondary | ICD-10-CM | POA: Diagnosis not present

## 2022-05-28 LAB — POCT INR: INR: 1.6 — AB (ref 2.0–3.0)

## 2022-05-28 NOTE — Patient Instructions (Addendum)
Pre visit review using our clinic review tool, if applicable. No additional management support is needed unless otherwise documented below in the visit note.  Continue  5 mg (2 tablets) daily except take 2.5 mg (1 tablet) on Sundays and take 3.75 mg (1 1/2 tablet) on Thursdays. Recheck in 4 weeks.  

## 2022-05-28 NOTE — Progress Notes (Signed)
Continue  5 mg (2 tablets) daily except take 2.5 mg (1 tablet) on Sundays and take 3.75 mg (1 1/2 tablet) on Thursdays. Recheck in 4 weeks.  

## 2022-06-04 ENCOUNTER — Ambulatory Visit (INDEPENDENT_AMBULATORY_CARE_PROVIDER_SITE_OTHER): Payer: BC Managed Care – PPO | Admitting: Psychology

## 2022-06-04 DIAGNOSIS — F4322 Adjustment disorder with anxiety: Secondary | ICD-10-CM

## 2022-06-04 NOTE — Progress Notes (Signed)
06/04/2022  Treatrment Plan:  Diagnosis F41.1 (Generalized anxiety disorder) [n/a]  Z62.820 (Parent child relationship problem) [n/a]  Symptoms Excessive and/or unrealistic worry that is difficult to control occurring more days than not for at least 6 months about a number of events or activities. (Status: maintained) -- No Description Entered  Regularly overindulges their child's wishes and demands. (Status: maintained) -- No Description Entered  Medication Status compliance  Safety none  If Suicidal or Homicidal State Action Taken: unspecified  Current Risk: low Medications unspecified Objectives Related Problem: Achieve a level of competent, effective parenting. Description: Increase the gradual letting go of their adolescent in constructive, affirmative ways. Target Date: 2022-11-13 Frequency: Daily Modality: individual Progress: 80%  Related Problem: Achieve a level of competent, effective parenting. Description: Identify unresolved childhood issues that affect parenting and work toward their resolution. Target Date: 2022-11-13 Frequency: Daily Modality: individual Progress: 80%  Related Problem: Achieve a level of competent, effective parenting. Description: Freely express feelings of frustration, helplessness, and inadequacy that each experiences in the parenting role. Target Date: 2022-11-13 Frequency: Daily Modality: individual Progress: 80%  Related Problem: Resolve the core conflict that is the source of anxiety. Description: Learn and implement problem-solving strategies for realistically addressing worries. Target Date: 2022-11-13 Frequency: Daily Modality: individual Progress: 75%  Related Problem: Resolve the core conflict that is the source of anxiety. Description: Learn and implement calming skills to reduce overall anxiety and manage anxiety symptoms. Target Date: 2022-11-13 Frequency: Daily Modality: individual Progress: 70%  Related Problem:  Resolve the core conflict that is the source of anxiety. Description: Describe situations, thoughts, feelings, and actions associated with anxieties and worries, their impact on functioning, and attempts to resolve them. Target Date: 2022-11-13 Frequency: Daily Modality: individual Progress: 70%  Client Response full compliance  Service Location Location, [address] [place], [place] [address]  Service Code cpt [phone]  Behavioral activation plan  Facilitate problem solving  Identify automatic thoughts  Emotion regulation skills  Provide education, information  Self care activities  Relaxation training  Lifestyle change (exercise, nutrition)  Self-monitoring  Validate/empathize  Session notes: F 41.1  Goals: She has chronic anxieties, mostly related to health concerns and her son's well-being. Would like to develop strategies to manage symptoms of anxiety. Needs to understand enmeshment and learn to define and manage appropriate family boundaries. Target date is 12-23. Patient has realized significant improvement in recognizing and establishing appropriate boundaries. Anxiety about health and family matters persist and she desires to continue treatment in an effort to manage and reduce these feelings. Revised target date is 6-24.  Meds: Xanax (.25 mg);   Patient agrees to a video session. She is at home and I am in my home office.  Camerin and [name]: [name] says that he is doing well and says he is "in a good spot". He talked about his desire for a break after school completed before starting work. There is an ambitious side that wants to travel and another side of him just wants to rest and recover and take care of himself. Nicole says that [name] seems less anxious than before. He does say, however, that he "pre-studied" during the break which makes the first couple of weeks easier. He clarified his intent to finish his degree the same way he started. This is upsetting for  Chesni, but is trying to accept she is powerless.  Marcelina Morel, PhD 2:15p-3:00p 45 min

## 2022-06-10 ENCOUNTER — Ambulatory Visit: Payer: BC Managed Care – PPO | Attending: Rheumatology | Admitting: Rheumatology

## 2022-06-10 ENCOUNTER — Encounter: Payer: Self-pay | Admitting: Rheumatology

## 2022-06-10 VITALS — BP 111/74 | HR 58 | Resp 15 | Ht 63.5 in | Wt 156.4 lb

## 2022-06-10 DIAGNOSIS — G8929 Other chronic pain: Secondary | ICD-10-CM

## 2022-06-10 DIAGNOSIS — M19072 Primary osteoarthritis, left ankle and foot: Secondary | ICD-10-CM

## 2022-06-10 DIAGNOSIS — H40053 Ocular hypertension, bilateral: Secondary | ICD-10-CM

## 2022-06-10 DIAGNOSIS — M79672 Pain in left foot: Secondary | ICD-10-CM

## 2022-06-10 DIAGNOSIS — K760 Fatty (change of) liver, not elsewhere classified: Secondary | ICD-10-CM

## 2022-06-10 DIAGNOSIS — M3500 Sicca syndrome, unspecified: Secondary | ICD-10-CM

## 2022-06-10 DIAGNOSIS — Z8639 Personal history of other endocrine, nutritional and metabolic disease: Secondary | ICD-10-CM

## 2022-06-10 DIAGNOSIS — M170 Bilateral primary osteoarthritis of knee: Secondary | ICD-10-CM

## 2022-06-10 DIAGNOSIS — M0609 Rheumatoid arthritis without rheumatoid factor, multiple sites: Secondary | ICD-10-CM | POA: Diagnosis not present

## 2022-06-10 DIAGNOSIS — M19071 Primary osteoarthritis, right ankle and foot: Secondary | ICD-10-CM

## 2022-06-10 DIAGNOSIS — Z862 Personal history of diseases of the blood and blood-forming organs and certain disorders involving the immune mechanism: Secondary | ICD-10-CM

## 2022-06-10 DIAGNOSIS — M810 Age-related osteoporosis without current pathological fracture: Secondary | ICD-10-CM

## 2022-06-10 DIAGNOSIS — Z79899 Other long term (current) drug therapy: Secondary | ICD-10-CM

## 2022-06-10 DIAGNOSIS — Z8669 Personal history of other diseases of the nervous system and sense organs: Secondary | ICD-10-CM

## 2022-06-10 DIAGNOSIS — M5136 Other intervertebral disc degeneration, lumbar region: Secondary | ICD-10-CM

## 2022-06-10 DIAGNOSIS — N390 Urinary tract infection, site not specified: Secondary | ICD-10-CM

## 2022-06-10 DIAGNOSIS — M545 Low back pain, unspecified: Secondary | ICD-10-CM

## 2022-06-10 DIAGNOSIS — Z86711 Personal history of pulmonary embolism: Secondary | ICD-10-CM

## 2022-06-10 MED ORDER — METHOTREXATE SODIUM 2.5 MG PO TABS: 20.0000 mg | ORAL_TABLET | ORAL | 0 refills | Status: DC

## 2022-06-10 MED ORDER — HYDROXYCHLOROQUINE SULFATE 200 MG PO TABS
ORAL_TABLET | ORAL | 0 refills | Status: DC
Start: 2022-06-10 — End: 2022-09-02

## 2022-06-10 NOTE — Patient Instructions (Signed)
Standing Labs We placed an order today for your standing lab work.   Please have your standing labs drawn in April and every 3 months  Please have your labs drawn 2 weeks prior to your appointment so that the provider can discuss your lab results at your appointment.  Please note that you may see your imaging and lab results in [place] before we have reviewed them. We will contact you once all results are reviewed. Please allow our office up to 72 hours to thoroughly review all of the results before contacting the office for clarification of your results.  Lab hours are:   Monday through Thursday from 8:00 am -12:30 pm and 1:00 pm-5:00 pm and Friday from 8:00 am-12:00 pm.  Please be advised, all patients with office appointments requiring lab work will take precedent over walk-in lab work.   Labs are drawn by [facility]. Please bring your co-pay at the time of your lab draw.  You may receive a bill from [facility] for your lab work.  Please note if you are on Hydroxychloroquine and and an order has been placed for a Hydroxychloroquine level, you will need to have it drawn 4 hours or more after your last dose.  If you wish to have your labs drawn at another location, please call the office 24 hours in advance so we can fax the orders.  The office is located at [address], [place], [place], [place] [address] No appointment is necessary.    If you have any questions regarding directions or hours of operation,  please call [phone].   As a reminder, please drink plenty of water prior to coming for your lab work. Thanks!   Vaccines You are taking a medication(s) that can suppress your immune system.  The following immunizations are recommended: Flu annually Covid-19  RSV Td/Tdap (tetanus, diphtheria, pertussis) every 10 years Pneumonia (Prevnar 15 then Pneumovax 23 at least 1 year apart.  Alternatively, can take Prevnar 20 without needing additional dose) Shingrix: 2 doses from 4  weeks to 6 months apart  If you have signs or symptoms of an infection or start antibiotics: First, call your PCP for workup of your infection. Hold your medication through the infection, until you complete your antibiotics, and until symptoms resolve if you take the following: Injectable medication (Actemra, Benlysta, Cimzia, Cosentyx, Enbrel, Humira, Kevzara, Orencia, Remicade, Simponi, Stelara, Taltz, Tremfya) Methotrexate Leflunomide (Arava) Mycophenolate (Cellcept) Roma Kayser, or Rinvoq   Please check with your PCP to make sure you are up to date.

## 2022-06-11 LAB — CBC WITH DIFFERENTIAL/PLATELET
Absolute Monocytes: 655 cells/uL (ref 200–950)
Basophils Absolute: 42 cells/uL (ref 0–200)
Basophils Relative: 0.5 %
Eosinophils Absolute: 92 cells/uL (ref 15–500)
Eosinophils Relative: 1.1 %
HCT: 42.5 % (ref 35.0–45.0)
Hemoglobin: 14 g/dL (ref 11.7–15.5)
Lymphs Abs: 3116 cells/uL (ref 850–3900)
MCH: 27.7 pg (ref 27.0–33.0)
MCHC: 32.9 g/dL (ref 32.0–36.0)
MCV: 84.2 fL (ref 80.0–100.0)
MPV: 11.8 fL (ref 7.5–12.5)
Monocytes Relative: 7.8 %
Neutro Abs: 4494 cells/uL (ref 1500–7800)
Neutrophils Relative %: 53.5 %
Platelets: 251 10*3/uL (ref 140–400)
RBC: 5.05 10*6/uL (ref 3.80–5.10)
RDW: 12.7 % (ref 11.0–15.0)
Total Lymphocyte: 37.1 %
WBC: 8.4 10*3/uL (ref 3.8–10.8)

## 2022-06-11 LAB — COMPLETE METABOLIC PANEL WITH GFR
AG Ratio: 1.5 (calc) (ref 1.0–2.5)
ALT: 14 U/L (ref 6–29)
AST: 19 U/L (ref 10–35)
Albumin: 4.2 g/dL (ref 3.6–5.1)
Alkaline phosphatase (APISO): 78 U/L (ref 37–153)
BUN: 9 mg/dL (ref 7–25)
CO2: 28 mmol/L (ref 20–32)
Calcium: 9.4 mg/dL (ref 8.6–10.4)
Chloride: 105 mmol/L (ref 98–110)
Creat: 0.73 mg/dL (ref 0.50–1.05)
Globulin: 2.8 g/dL (calc) (ref 1.9–3.7)
Glucose, Bld: 83 mg/dL (ref 65–99)
Potassium: 4.2 mmol/L (ref 3.5–5.3)
Sodium: 140 mmol/L (ref 135–146)
Total Bilirubin: 0.7 mg/dL (ref 0.2–1.2)
Total Protein: 7 g/dL (ref 6.1–8.1)
eGFR: 92 mL/min/{1.73_m2} (ref >=60)

## 2022-06-11 NOTE — Progress Notes (Signed)
CBC and CMP are normal.

## 2022-06-18 ENCOUNTER — Ambulatory Visit: Payer: BC Managed Care – PPO | Admitting: Psychology

## 2022-06-25 ENCOUNTER — Ambulatory Visit (INDEPENDENT_AMBULATORY_CARE_PROVIDER_SITE_OTHER): Payer: BC Managed Care – PPO

## 2022-06-25 DIAGNOSIS — Z7901 Long term (current) use of anticoagulants: Secondary | ICD-10-CM

## 2022-06-25 LAB — POCT INR: INR: 1.6 — AB (ref 2.0–3.0)

## 2022-06-25 MED ORDER — WARFARIN SODIUM 2.5 MG PO TABS: ORAL_TABLET | ORAL | 1 refills | Status: DC

## 2022-06-25 NOTE — Progress Notes (Addendum)
Continue  5 mg (2 tablets) daily except take 2.5 mg (1 tablet) on Sundays and take 3.75 mg (1 1/2 tablet) on Thursdays. Recheck in 4 weeks.   Pt requested refill of warfarin. Sent in refill.

## 2022-06-25 NOTE — Patient Instructions (Addendum)
Pre visit review using our clinic review tool, if applicable. No additional management support is needed unless otherwise documented below in the visit note.  Continue  5 mg (2 tablets) daily except take 2.5 mg (1 tablet) on Sundays and take 3.75 mg (1 1/2 tablet) on Thursdays. Recheck in 4 weeks.  

## 2022-07-02 ENCOUNTER — Telehealth: Payer: Self-pay

## 2022-07-02 ENCOUNTER — Ambulatory Visit (INDEPENDENT_AMBULATORY_CARE_PROVIDER_SITE_OTHER): Payer: BC Managed Care – PPO | Admitting: Psychology

## 2022-07-02 DIAGNOSIS — F4322 Adjustment disorder with anxiety: Secondary | ICD-10-CM | POA: Diagnosis not present

## 2022-07-02 NOTE — Telephone Encounter (Signed)
Pt reports she was prescribed magnesium by her pain physician and is requesting if it will interact with warfarin. Magnesium can interact with warfarin and decrease the effectiveness. Should be taken at least two hours apart.   Pt has not decided if she will take it yet or not. Advised about 1 week after starting she should have INR checked. Pt verbalized understanding and will let coumadin clinic know if she has started.

## 2022-07-02 NOTE — Progress Notes (Signed)
07/02/2022  Treatrment Plan:  Diagnosis F41.1 (Generalized anxiety disorder) [n/a]  Z62.820 (Parent child relationship problem) [n/a]  Symptoms Excessive and/or unrealistic worry that is difficult to control occurring more days than not for at least 6 months about a number of events or activities. (Status: maintained) -- No Description Entered  Regularly overindulges their child's wishes and demands. (Status: maintained) -- No Description Entered  Medication Status compliance  Safety none  If Suicidal or Homicidal State Action Taken: unspecified  Current Risk: low Medications unspecified Objectives Related Problem: Achieve a level of competent, effective parenting. Description: Increase the gradual letting go of their adolescent in constructive, affirmative ways. Target Date: 2022-11-13 Frequency: Daily Modality: individual Progress: 80%  Related Problem: Achieve a level of competent, effective parenting. Description: Identify unresolved childhood issues that affect parenting and work toward their resolution. Target Date: 2022-11-13 Frequency: Daily Modality: individual Progress: 80%  Related Problem: Achieve a level of competent, effective parenting. Description: Freely express feelings of frustration, helplessness, and inadequacy that each experiences in the parenting role. Target Date: 2022-11-13 Frequency: Daily Modality: individual Progress: 80%  Related Problem: Resolve the core conflict that is the source of anxiety. Description: Learn and implement problem-solving strategies for realistically addressing worries. Target Date: 2022-11-13 Frequency: Daily Modality: individual Progress: 75%  Related Problem: Resolve the core conflict that is the source of anxiety. Description: Learn and implement calming skills to reduce overall anxiety and manage anxiety symptoms. Target Date: 2022-11-13 Frequency: Daily Modality: individual Progress:  70%  Related Problem: Resolve the core conflict that is the source of anxiety. Description: Describe situations, thoughts, feelings, and actions associated with anxieties and worries, their impact on functioning, and attempts to resolve them. Target Date: 2022-11-13 Frequency: Daily Modality: individual Progress: 70%  Client Response full compliance  Service Location Location, [address] [place], [place] [address]  Service Code cpt [phone]  Behavioral activation plan  Facilitate problem solving  Identify automatic thoughts  Emotion regulation skills  Provide education, information  Self care activities  Relaxation training  Lifestyle change (exercise, nutrition)  Self-monitoring  Validate/empathize  Session notes: F 41.1  Goals: She has chronic anxieties, mostly related to health concerns and her son's well-being. Would like to develop strategies to manage symptoms of anxiety. Needs to understand enmeshment and learn to define and manage appropriate family boundaries. Target date is 12-23. Patient has realized significant improvement in recognizing and establishing appropriate boundaries. Anxiety about health and family matters persist and she desires to continue treatment in an effort to manage and reduce these feelings. Revised target date is 6-24.  Meds: Xanax (.25 mg);   Patient agrees to a video session. She is at home and I am in my home office.  Jolyssa and [name]: [name] is meeting with his old boss from [employer] next week. We talked about the difficulties of living in [place] for him, most notaly He is disappointed that he never heard back from some of his other applications. He is not alarmed at this point but [place] is concerned. She says that she is not in panic mode "yet". But worries he may have already passed up a good opportunity. We talked about not getting overly stressed during this period and to let [name] make decisions for himself.  Marcelina Morel, PhD 3:15p-4:00p 45 min

## 2022-07-04 ENCOUNTER — Other Ambulatory Visit: Payer: Self-pay | Admitting: Rheumatology

## 2022-07-07 ENCOUNTER — Encounter: Payer: Self-pay | Admitting: Rheumatology

## 2022-07-16 ENCOUNTER — Ambulatory Visit (INDEPENDENT_AMBULATORY_CARE_PROVIDER_SITE_OTHER): Payer: BC Managed Care – PPO | Admitting: Psychology

## 2022-07-16 DIAGNOSIS — F4322 Adjustment disorder with anxiety: Secondary | ICD-10-CM | POA: Diagnosis not present

## 2022-07-16 NOTE — Progress Notes (Signed)
07/16/2022  Treatrment Plan:  Diagnosis F41.1 (Generalized anxiety disorder) [n/a]  Z62.820 (Parent child relationship problem) [n/a]  Symptoms Excessive and/or unrealistic worry that is difficult to control occurring more days than not for at least 6 months about a number of events or activities. (Status: maintained) -- No Description Entered  Regularly overindulges their child's wishes and demands. (Status: maintained) -- No Description Entered  Medication Status compliance  Safety none  If Suicidal or Homicidal State Action Taken: unspecified  Current Risk: low Medications unspecified Objectives Related Problem: Achieve a level of competent, effective parenting. Description: Increase the gradual letting go of their adolescent in constructive, affirmative ways. Target Date: 2022-11-13 Frequency: Daily Modality: individual Progress: 80%  Related Problem: Achieve a level of competent, effective parenting. Description: Identify unresolved childhood issues that affect parenting and work toward their resolution. Target Date: 2022-11-13 Frequency: Daily Modality: individual Progress: 80%  Related Problem: Achieve a level of competent, effective parenting. Description: Freely express feelings of frustration, helplessness, and inadequacy that each experiences in the parenting role. Target Date: 2022-11-13 Frequency: Daily Modality: individual Progress: 80%  Related Problem: Resolve the core conflict that is the source of anxiety. Description: Learn and implement problem-solving strategies for realistically addressing worries. Target Date: 2022-11-13 Frequency: Daily Modality: individual Progress: 75%  Related Problem: Resolve the core conflict that is the source of anxiety. Description: Learn and implement calming skills to reduce overall anxiety and manage anxiety symptoms. Target Date: 2022-11-13 Frequency: Daily Modality: individual Progress: 70%  Related Problem:  Resolve the core conflict that is the source of anxiety. Description: Describe situations, thoughts, feelings, and actions associated with anxieties and worries, their impact on functioning, and attempts to resolve them. Target Date: 2022-11-13 Frequency: Daily Modality: individual Progress: 70%  Client Response full compliance  Service Location Location, [address] [place], [place] [address]  Service Code cpt [phone]  Behavioral activation plan  Facilitate problem solving  Identify automatic thoughts  Emotion regulation skills  Provide education, information  Self care activities  Relaxation training  Lifestyle change (exercise, nutrition)  Self-monitoring  Validate/empathize  Session notes: F 41.1  Goals: She has chronic anxieties, mostly related to health concerns and her son's well-being. Would like to develop strategies to manage symptoms of anxiety. Needs to understand enmeshment and learn to define and manage appropriate family boundaries. Target date is 12-23. Patient has realized significant improvement in recognizing and establishing appropriate boundaries. Anxiety about health and family matters persist and she desires to continue treatment in an effort to manage and reduce these feelings. Revised target date is 6-24.  Meds: Xanax (.25 mg);   Patient agrees to a video session. She is at home and I am in my home office.  [name] and [name]: [name] says he is "not well" because he got sick after eating take out. He went to doctor and tests were taken, but no diagnosis. He has again, overworked and "abused his body" in effort to excel in is classes.  We focused on his comment that he has nobody to drive him to the doctor and how that reflects on his social isolation over the past two years.                                           Krystal Morel, PhD 3:15p-4:00p 45 min

## 2022-07-23 ENCOUNTER — Ambulatory Visit (INDEPENDENT_AMBULATORY_CARE_PROVIDER_SITE_OTHER): Payer: BC Managed Care – PPO

## 2022-07-23 DIAGNOSIS — Z7901 Long term (current) use of anticoagulants: Secondary | ICD-10-CM | POA: Diagnosis not present

## 2022-07-23 LAB — POCT INR: INR: 1.7 — AB (ref 2.0–3.0)

## 2022-07-23 NOTE — Patient Instructions (Addendum)
Pre visit review using our clinic review tool, if applicable. No additional management support is needed unless otherwise documented below in the visit note.  Continue  5 mg (2 tablets) daily except take 2.5 mg (1 tablet) on Sundays and take 3.75 mg (1 1/2 tablet) on Thursdays. Recheck in 4 weeks.

## 2022-07-23 NOTE — Progress Notes (Signed)
Continue  5 mg (2 tablets) daily except take 2.5 mg (1 tablet) on Sundays and take 3.75 mg (1 1/2 tablet) on Thursdays. Recheck in 4 weeks.

## 2022-07-24 ENCOUNTER — Telehealth: Payer: Self-pay

## 2022-07-24 NOTE — Telephone Encounter (Signed)
Pt reports she was prescribed prednisone, 20 mg x 4d, 15 mg x 4d, 10 mg x 4d, and 5 mg x 4d. Pt wants to know if any change to warfarin dose should be made or if INR should be checked. Advised when she took this dose in the past it did not affect her INR. So no change to warfarin dosing is advised. Pt also reports she was prescribed tramadol. Advised ok to take but if taking more than 1 per day to contact coumadin clinic for INR check because it can interact with warfarin with some pts. Advised if pt feels she needs INR checked to contact clinic. Advised if any s/s of abnormal bruising or bleeding to contact office or go to ER. Pt verbalized understanding.

## 2022-07-30 ENCOUNTER — Ambulatory Visit: Payer: BC Managed Care – PPO | Admitting: Psychology

## 2022-08-07 ENCOUNTER — Encounter: Payer: Self-pay | Admitting: Rheumatology

## 2022-08-11 ENCOUNTER — Ambulatory Visit
Admission: RE | Admit: 2022-08-11 | Discharge: 2022-08-11 | Disposition: A | Discharge status: Home / Self Care | Payer: BC Managed Care – PPO | Source: Ambulatory Visit | Attending: Internal Medicine | Admitting: Internal Medicine

## 2022-08-11 ENCOUNTER — Telehealth: Payer: Self-pay

## 2022-08-11 DIAGNOSIS — E2839 Other primary ovarian failure: Secondary | ICD-10-CM

## 2022-08-11 NOTE — Telephone Encounter (Signed)
Pt reports she has started a high dose taper prednisone on 3/23 and would like to change her coumadin clinic apt from next week to this week. Advised to change to 3/27. Pt agreed and is scheduled for 1:15. Advised if any other changes to contact coumadin clinic. Pt verbalized understanding.

## 2022-08-11 NOTE — Progress Notes (Signed)
No sig change or deterioration  in bone density over last 3 years  . Marland Kitchen Stable  sharing with Dr August Luz

## 2022-08-13 ENCOUNTER — Ambulatory Visit: Payer: BC Managed Care – PPO | Admitting: Psychology

## 2022-08-13 ENCOUNTER — Ambulatory Visit (INDEPENDENT_AMBULATORY_CARE_PROVIDER_SITE_OTHER): Payer: BC Managed Care – PPO

## 2022-08-13 DIAGNOSIS — F4322 Adjustment disorder with anxiety: Secondary | ICD-10-CM | POA: Diagnosis not present

## 2022-08-13 DIAGNOSIS — Z7901 Long term (current) use of anticoagulants: Secondary | ICD-10-CM

## 2022-08-13 LAB — POCT INR: INR: 1.4 — AB (ref 2.0–3.0)

## 2022-08-13 NOTE — Patient Instructions (Addendum)
Pre visit review using our clinic review tool, if applicable. No additional management support is needed unless otherwise documented below in the visit note.  Increase dose today to take 6.25 mg (2 1/2 tablets) and then continue  5 mg (2 tablets) daily except take 2.5 mg (1 tablet) on Sundays and take 3.75 mg (1 1/2 tablet) on Thursdays. Recheck in 3 weeks.

## 2022-08-13 NOTE — Progress Notes (Signed)
08/13/2022  Treatrment Plan:  Diagnosis F41.1 (Generalized anxiety disorder) [n/a]  Z62.820 (Parent child relationship problem) [n/a]  Symptoms Excessive and/or unrealistic worry that is difficult to control occurring more days than not for at least 6 months about a number of events or activities. (Status: maintained) -- No Description Entered  Regularly overindulges their child's wishes and demands. (Status: maintained) -- No Description Entered  Medication Status compliance  Safety none  If Suicidal or Homicidal State Action Taken: unspecified  Current Risk: low Medications unspecified Objectives Related Problem: Achieve a level of competent, effective parenting. Description: Increase the gradual letting go of their adolescent in constructive, affirmative ways. Target Date: 2022-11-13 Frequency: Daily Modality: individual Progress: 80%  Related Problem: Achieve a level of competent, effective parenting. Description: Identify unresolved childhood issues that affect parenting and work toward their resolution. Target Date: 2022-11-13 Frequency: Daily Modality: individual Progress: 80%  Related Problem: Achieve a level of competent, effective parenting. Description: Freely express feelings of frustration, helplessness, and inadequacy that each experiences in the parenting role. Target Date: 2022-11-13 Frequency: Daily Modality: individual Progress: 80%  Related Problem: Resolve the core conflict that is the source of anxiety. Description: Learn and implement problem-solving strategies for realistically addressing worries. Target Date: 2022-11-13 Frequency: Daily Modality: individual Progress: 75%  Related Problem: Resolve the core conflict that is the source of anxiety. Description: Learn and implement calming skills to reduce overall anxiety and manage anxiety symptoms. Target Date: 2022-11-13 Frequency: Daily Modality: individual Progress:  70%  Related Problem: Resolve the core conflict that is the source of anxiety. Description: Describe situations, thoughts, feelings, and actions associated with anxieties and worries, their impact on functioning, and attempts to resolve them. Target Date: 2022-11-13 Frequency: Daily Modality: individual Progress: 70%  Client Response full compliance  Service Location Location, [address] [place], [place] [address]  Service Code cpt [phone]  Behavioral activation plan  Facilitate problem solving  Identify automatic thoughts  Emotion regulation skills  Provide education, information  Self care activities  Relaxation training  Lifestyle change (exercise, nutrition)  Self-monitoring  Validate/empathize  Session notes: F 41.1  Goals: She has chronic anxieties, mostly related to health concerns and her son's well-being. Would like to develop strategies to manage symptoms of anxiety. Needs to understand enmeshment and learn to define and manage appropriate family boundaries. Target date is 12-23. Patient has realized significant improvement in recognizing and establishing appropriate boundaries. Anxiety about health and family matters persist and she desires to continue treatment in an effort to manage and reduce these feelings. Revised target date is 6-24.  Meds: Xanax (.25 mg);   Patient agrees to a video session. She is at home and I am in my home office.   [name] and [name]: [name] will be graduating in a month. He has a job interview with [employer]. He and his mom both expressed their concerns that [name] will approach work in exactly the same way he has approached every academic and professional task in his life. Discussed what it will take to make a complete change in how he approaches a new position. [name] expressed her doubt and shared her anxiety for her son's ability to make healthy decisions for himself. Again shared the importance of letting go and demonstrating  confidence.  [name], [name] 3:15p-4:00p 45 min

## 2022-08-13 NOTE — Progress Notes (Signed)
Thank you,Dr. Regis Bill.  She had been on Fosamax since 2021 and had no improvement in her DEXA scan.  I plan to discuss an anabolic agent at the follow-up visit.

## 2022-08-13 NOTE — Progress Notes (Signed)
Pt taking prednisone taper and will finish on 4/5. Pt also reports she has a sore left calf and she thinks it is from doing stretch exercises with a resistance band. Pt denies any swelling, redness, or warmth. Advised if she developed any new symptoms or the soreness worsened to contact office to see a provider. Pt verbalized understanding.  Increase dose today to take 6.25 mg (2 1/2 tablets) and then continue  5 mg (2 tablets) daily except take 2.5 mg (1 tablet) on Sundays and take 3.75 mg (1 1/2 tablet) on Thursdays. Recheck in 3 weeks.

## 2022-08-18 NOTE — Progress Notes (Unsigned)
No chief complaint on file.   HPI: Patient  [name]  62 y.o. comes in today for Preventive Health Care visit  And Chronic disease management  review has number of underlying conditions  currently controlled  RA ANA dr D   Hx pe ON anticoag  Iron def, sicca  anxiety related to health osteoporosis recurrent uts  vit b12 defi  Health Maintenance  Topic Date Due   PAP SMEAR-Modifier  06/21/2019   COVID-19 Vaccine (5 - 2023-24 season) 01/17/2022   INFLUENZA VACCINE  12/18/2022   DTaP/Tdap/Td (2 - Td or Tdap) 06/01/2023   MAMMOGRAM  11/06/2023   COLONOSCOPY (Pts 45-71yrs Insurance coverage will need to be confirmed)  07/19/2028   Hepatitis C Screening  Completed   HIV Screening  Completed   Zoster Vaccines- Shingrix  Completed   HPV VACCINES  Aged Out   Health Maintenance Review LIFESTYLE:  Exercise:   Tobacco/ETS: Alcohol:  Sugar beverages: Sleep: Drug use: no HH of  Work:    ROS:  GEN/ HEENT: No fever, significant weight changes sweats headaches vision problems hearing changes, CV/ PULM; No chest pain shortness of breath cough, syncope,edema  change in exercise tolerance. GI /GU: No adominal pain, vomiting, change in bowel habits. No blood in the stool. No significant GU symptoms. SKIN/HEME: ,no acute skin rashes suspicious lesions or bleeding. No lymphadenopathy, nodules, masses.  NEURO/ PSYCH:  No neurologic signs such as weakness numbness. No depression anxiety. IMM/ Allergy: No unusual infections.  Allergy .   REST of 12 system review negative except as per HPI   Past Medical History:  Diagnosis Date   Adjustment disorder with anxiety    B12 deficiency due to diet    is a vegetarian   Chest pain, unspecified    Chronic anticoagulation    Fatty liver    Per patient   GERD (gastroesophageal reflux disease)    Headache(784.0)    migraines and head pain   Hx of varicella    As Child   Iron deficiency anemia, unspecified    Otalgia, unspecified     Other and unspecified hyperlipidemia    Palpitations    Personal history of venous thrombosis and embolism    Recurrent UTI    Rheumatoid arthritis(714.0)    deveshewar   Unspecified vitamin D deficiency    UTI (lower urinary tract infection) 11/28/2011   citrobacter       Past Surgical History:  Procedure Laterality Date   ORIF ULNAR FRACTURE     TYMPANOSTOMY TUBE PLACEMENT  2009   Right    Family History  Problem Relation Age of Onset   Parkinsonism Mother    Dementia Father    Stroke Father    Hyperlipidemia Father    Diabetes Father    Lymphoma Other    Colon cancer Neg Hx    Colon polyps Neg Hx    Esophageal cancer Neg Hx    Stomach cancer Neg Hx    Rectal cancer Neg Hx    Breast cancer Neg Hx     Social History   Socioeconomic History   Marital status: Married    Spouse name: Not on file   Number of children: 1   Years of education: Not on file   Highest education level: Bachelor's degree (e.g., BA, AB, BS)  Occupational History   Occupation: home maker  Tobacco Use   Smoking status: Never    Passive exposure: Past   Smokeless tobacco: Never  Vaping  Use   Vaping Use: Never used  Substance and Sexual Activity   Alcohol use: No   Drug use: Never   Sexual activity: Not on file  Other Topics Concern   Not on file  Social History Narrative   Regular Exercise-no   20 yrs in [place]   Son at [employer] [employer]   From [place].   Married HH  of 3    G1P1            Social Determinants of Health   Financial Resource Strain: Low Risk  (07/16/2021)   Overall Financial Resource Strain (CARDIA)    Difficulty of Paying Living Expenses: Not hard at all  Food Insecurity: No Food Insecurity (07/16/2021)   Hunger Vital Sign    Worried About Running Out of Food in the Last Year: Never true    Ran Out of Food in the Last Year: Never true  Transportation Needs: No Transportation Needs (07/16/2021)   PRAPARE - Hydrologist (Medical): No    Lack  of Transportation (Non-Medical): No  Physical Activity: Insufficiently Active (07/16/2021)   Exercise Vital Sign    Days of Exercise per Week: 2 days    Minutes of Exercise per Session: 50 min  Stress: Stress Concern Present (07/16/2021)   [place]    Feeling of Stress : To some extent  Social Connections: Unknown (07/16/2021)   Social Connection and Isolation Panel [NHANES]    Frequency of Communication with Friends and Family: More than three times a week    Frequency of Social Gatherings with Friends and Family: Patient declined    Attends Religious Services: Patient declined    Marine scientist or Organizations: No    Attends Music therapist: Not on file    Marital Status: Married    Outpatient Medications Prior to Visit  Medication Sig Dispense Refill   alendronate (FOSAMAX) 70 MG tablet TAKE 1 TABLET(70 MG) BY MOUTH 1 TIME A WEEK WITH A FULL GLASS OF WATER AND ON AN EMPTY STOMACH 12 tablet 0   ALPRAZolam (XANAX) 0.25 MG tablet Take 1 tablet (0.25 mg total) by mouth 2 (two) times daily as needed for anxiety. 20 tablet 0   Calcium Carb-Cholecalciferol 600-800 MG-UNIT TABS Take by mouth.     Camphor-Menthol-Methyl Sal (SALONPAS EX) Apply topically as needed.     Cholecalciferol (VITAMIN D3) 2000 UNITS TABS Take 1 tablet by mouth daily.     Cranberry 1000 MG CAPS Take by mouth. 3 times a week     Cyanocobalamin (VITAMIN B-12 PO) Take by mouth once a week.     cyclobenzaprine (FLEXERIL) 5 MG tablet Take 0.5-1 tablets (2.5-5 mg total) by mouth at bedtime as needed for muscle spasms. 20 tablet 3   cycloSPORINE (RESTASIS) 0.05 % ophthalmic emulsion Place 1 drop into both eyes daily.     Eflornithine HCl 13.9 % cream Apply topically daily.      famotidine (PEPCID) 20 MG tablet Take 20 mg by mouth. prn     folic acid (FOLVITE) 1 MG tablet TAKE 2 TABLETS(2 MG) BY MOUTH DAILY 180 tablet 3    hydroxychloroquine (PLAQUENIL) 200 MG tablet Take 200mg  by mouth twice daily Monday through Friday only. 120 tablet 0   methotrexate (RHEUMATREX) 2.5 MG tablet Take 8 tablets (20 mg total) by mouth once a week. 96 tablet 0   mineral/vitamin supplement (MULTIGEN) 70 MG TABS tablet TAKE 1 TABLET  EVERY DAY 90 tablet 1   Omeprazole Magnesium (PRILOSEC PO) Take by mouth 4 (four) times a week.     SUMAtriptan (IMITREX) 50 MG tablet TAKE ONE TABLET BY MOUTH AS NEEDED. MAY REPEAT IN 2 TO 4 HOURS IF NEEDED. 9 tablet 1   warfarin (COUMADIN) 2.5 MG tablet TAKE 2 TABLETS BY MOUTH DAILY, EXCEPT TAKE  1 TABLET ON SUNDAYS AND TAKE 1 1/2 TABLETS ON THURSDAYS OR AS DIRECTED BY ANTICOAGULATION CLINIC 180 tablet 1   No facility-administered medications prior to visit.     EXAM:  There were no vitals taken for this visit.  There is no height or weight on file to calculate BMI. Wt Readings from Last 3 Encounters:  06/10/22 156 lb 6.4 oz (70.9 kg)  02/26/22 157 lb 3.2 oz (71.3 kg)  01/30/22 159 lb 12.8 oz (72.5 kg)    Physical Exam: Vital signs reviewed [id number] is a well-developed well-nourished alert cooperative    who appearsr stated age in no acute distress.  HEENT: normocephalic atraumatic , Eyes: PERRL EOM's full, conjunctiva clear, Nares: paten,t no deformity discharge or tenderness., Ears: no deformity EAC's clear TMs with normal landmarks. Mouth: clear OP, no lesions, edema.  Moist mucous membranes. Dentition in adequate repair. NECK: supple without masses, thyromegaly or bruits. CHEST/PULM:  Clear to auscultation and percussion breath sounds equal no wheeze , rales or rhonchi. No chest wall deformities or tenderness. Breast: normal by inspection . No dimpling, discharge, masses, tenderness or discharge . CV: PMI is nondisplaced, S1 S2 no gallops, murmurs, rubs. Peripheral pulses are full without delay.No JVD .  ABDOMEN: Bowel sounds normal nontender  No guard or rebound, no hepato splenomegal no  CVA tenderness. Extremtities:  No clubbing cyanosis or edema, no acute joint swelling or redness no focal atrophy NEURO:  Oriented x3, cranial nerves 3-12 appear to be intact, no obvious focal weakness,gait within normal limits no abnormal reflexes or asymmetrical SKIN: No acute rashes normal turgor, color, no bruising or petechiae. PSYCH: Oriented, good eye contact, no obvious depression anxiety, cognition and judgment appear normal. LN: no cervical axillary inguinal adenopathy  Lab Results  Component Value Date   WBC 8.4 06/10/2022   HGB 14.0 06/10/2022   HCT 42.5 06/10/2022   PLT 251 06/10/2022   GLUCOSE 83 06/10/2022   CHOL 192 06/19/2021   TRIG 144 06/19/2021   HDL 67 06/19/2021   LDLDIRECT 97.3 05/25/2013   LDLCALC 101 (H) 06/19/2021   ALT 14 06/10/2022   AST 19 06/10/2022   NA 140 06/10/2022   K 4.2 06/10/2022   CL 105 06/10/2022   CREATININE 0.73 06/10/2022   BUN 9 06/10/2022   CO2 28 06/10/2022   TSH 3.21 09/13/2019   INR 1.4 (A) 08/13/2022   HGBA1C 6.0 07/20/2018    BP Readings from Last 3 Encounters:  06/10/22 111/74  02/26/22 110/80  01/30/22 115/75    Labplan reviewed with patient  due lipids tsh and A1c   ASSESSMENT AND PLAN:  Discussed the following assessment and plan:    ICD-10-CM   1. Visit for preventive health examination  Z00.00     2. Long term (current) use of anticoagulants  Z79.01     3. Long term current use of systemic steroids  Z79.52     4. Vitamin D deficiency  E55.9      No follow-ups on file.  Patient Care Team: Jaylend Reiland, Standley Brooking, MD as PCP - General Bo Merino, MD (Rheumatology) Richmond Campbell, MD (Gastroenterology) Benjamine Mola,  Kasandra Knudsen, MD (Otolaryngology) Dohmeier, Asencion Partridge, MD (Neurology) Rolm Bookbinder, MD (Dermatology) Christophe Louis, MD as Consulting Physician (Obstetrics and Gynecology) Dionne Milo, Rockney Ghee, MD as Referring Physician (Hematology and Oncology) Charlotte Crumb, MD as Consulting Physician (Orthopedic  Surgery) [facility] Festus Aloe, MD as Consulting Physician (Urology) There are no Patient Instructions on file for this visit.  Standley Brooking. Hinley Brimage M.D.

## 2022-08-19 ENCOUNTER — Encounter: Payer: Self-pay | Admitting: Internal Medicine

## 2022-08-19 ENCOUNTER — Ambulatory Visit (INDEPENDENT_AMBULATORY_CARE_PROVIDER_SITE_OTHER): Payer: BC Managed Care – PPO | Admitting: Internal Medicine

## 2022-08-19 VITALS — BP 110/78 | HR 59 | Temp 97.6°F | Ht 62.0 in | Wt 152.6 lb

## 2022-08-19 DIAGNOSIS — M79662 Pain in left lower leg: Secondary | ICD-10-CM

## 2022-08-19 DIAGNOSIS — Z0000 Encounter for general adult medical examination without abnormal findings: Secondary | ICD-10-CM

## 2022-08-19 DIAGNOSIS — Z7901 Long term (current) use of anticoagulants: Secondary | ICD-10-CM | POA: Diagnosis not present

## 2022-08-19 DIAGNOSIS — Z79899 Other long term (current) drug therapy: Secondary | ICD-10-CM

## 2022-08-19 DIAGNOSIS — Z789 Other specified health status: Secondary | ICD-10-CM

## 2022-08-19 DIAGNOSIS — E538 Deficiency of other specified B group vitamins: Secondary | ICD-10-CM

## 2022-08-19 DIAGNOSIS — E559 Vitamin D deficiency, unspecified: Secondary | ICD-10-CM | POA: Diagnosis not present

## 2022-08-19 DIAGNOSIS — E611 Iron deficiency: Secondary | ICD-10-CM

## 2022-08-19 DIAGNOSIS — M0609 Rheumatoid arthritis without rheumatoid factor, multiple sites: Secondary | ICD-10-CM

## 2022-08-19 DIAGNOSIS — R202 Paresthesia of skin: Secondary | ICD-10-CM | POA: Diagnosis not present

## 2022-08-19 DIAGNOSIS — Z7952 Long term (current) use of systemic steroids: Secondary | ICD-10-CM

## 2022-08-19 DIAGNOSIS — M549 Dorsalgia, unspecified: Secondary | ICD-10-CM

## 2022-08-19 MED ORDER — ALPRAZOLAM 0.25 MG PO TABS: 0.2500 mg | ORAL_TABLET | Freq: Two times a day (BID) | ORAL | 0 refills | Status: DC | PRN

## 2022-08-19 MED ORDER — NIRMATRELVIR/RITONAVIR (PAXLOVID)TABLET
ORAL_TABLET | ORAL | 0 refills | Status: DC
Start: 2022-08-19 — End: 2023-04-23

## 2022-08-19 NOTE — Progress Notes (Signed)
Office Visit Note  Patient: [name]             Date of Birth: Apr 20, 1961           MRN: [id number]             PCP: [name], MD Referring: [name], MD Visit Date: 09/02/2022 Occupation: @GUAROCC @  Subjective:  Medication management  History of Present Illness: [name] is a 62 y.o. female striae of rheumatoid arthritis, osteoarthritis and degenerative disc disease.  She has not noticed any joint swelling.  She has been taking methotrexate and hydroxychloroquine on a regular basis.  She states she continues to have lower back pain and discomfort.  She was evaluated by Dr. [name].  Patient states she was given a prednisone taper for 14 days which resolved her lower back pain.  She has been experiencing tingling and numbness in her bilateral hands and her bilateral feet is March thousand 24.  She states the tingling is started shortly after having dry needling.  She is planning to see Dr. [name] neurosurgeon and Dr. [name] Foot neurologist for the evaluation.    Activities of Daily Living:  Patient reports morning stiffness for a few minutes.   Patient Reports nocturnal pain.  Difficulty dressing/grooming: Denies Difficulty climbing stairs: Denies Difficulty getting out of chair: Denies Difficulty using hands for taps, buttons, cutlery, and/or writing: Denies  Review of Systems  Constitutional:  Positive for fatigue.  HENT:  Positive for mouth dryness. Negative for mouth sores.   Eyes:  Positive for dryness.  Respiratory:  Negative for shortness of breath.   Cardiovascular:  Negative for chest pain and palpitations.  Gastrointestinal:  Negative for blood in stool, constipation and diarrhea.  Endocrine: Negative for increased urination.  Genitourinary:  Negative for involuntary urination.  Musculoskeletal:  Positive for joint pain, joint pain, myalgias, morning stiffness and myalgias. Negative for gait problem, joint swelling, muscle weakness and muscle  tenderness.  Skin:  Negative for color change, rash, hair loss and sensitivity to sunlight.  Allergic/Immunologic: Negative for susceptible to infections.  Neurological:  Positive for numbness and parasthesias. Negative for dizziness and headaches.  Hematological:  Negative for swollen glands.  Psychiatric/Behavioral:  Positive for sleep disturbance. Negative for depressed mood. The patient is nervous/anxious.     PMFS History:  Patient Active Problem List   Diagnosis Date Noted   Sicca syndrome with lung involvement 01/06/2018   Long term (current) use of anticoagulants 02/11/2017   Sicca syndrome 12/22/2016   ANA positive 12/22/2016   Raised intraocular pressure of both eyes 12/22/2016   Migraine 03/31/2016   Anxiety 03/31/2016   High risk medication use 03/31/2016   Primary osteoarthritis of both feet 03/31/2016   Primary osteoarthritis of both knees 03/31/2016   Stiffness of left hand joint 11/27/2014   Elevated IOP 06/09/2014   Long term current use of systemic steroids 06/09/2014   Medically complex patient 06/09/2014   Current use of steroid medication 01/09/2014   Xerosis of skin 12/07/2013   Encounter for therapeutic drug monitoring 06/09/2013   B12 deficiency 05/31/2013   Iron deficiency 05/31/2013   Visit for preventive health examination 05/25/2012   Mammogram abnormal 04/12/2012   Iliac crest  pain 01/23/2012   Vaginal atrophy 01/23/2012   History of recurrent UTI (urinary tract infection) 01/23/2012   Osteoporosis 04/29/2011   Nonspecific abnormal results of liver function study 12/08/2010   Chronic anticoagulation    Anticoagulant long-term use 07/15/2010   Anticardiolipin antibody  positive 07/15/2010   Chronic cough 03/19/2009   CHEST PAIN-UNSPECIFIED 08/26/2008   PALPITATIONS, RECURRENT 07/24/2008   Vitamin D deficiency 05/30/2008   HYPERLIPIDEMIA 05/30/2008   IRON DEFICIENCY 05/30/2008   Adjustment disorder with anxiety 05/30/2008   Rheumatoid  arthritis 05/30/2008   PULMONARY EMBOLISM, HX OF 05/30/2008    Past Medical History:  Diagnosis Date   Adjustment disorder with anxiety    B12 deficiency due to diet    is a vegetarian   Chest pain, unspecified    Chronic anticoagulation    Fatty liver    Per patient   GERD (gastroesophageal reflux disease)    Headache(784.0)    migraines and head pain   Hx of varicella    As Child   Iron deficiency anemia, unspecified    Otalgia, unspecified    Other and unspecified hyperlipidemia    Palpitations    Personal history of venous thrombosis and embolism    Recurrent UTI    Rheumatoid arthritis(714.0)    deveshewar   Unspecified vitamin D deficiency    UTI (lower urinary tract infection) 11/28/2011   citrobacter       Family History  Problem Relation Age of Onset   Parkinsonism Mother    Dementia Father    Stroke Father    Hyperlipidemia Father    Diabetes Father    Lymphoma Other    Colon cancer Neg Hx    Colon polyps Neg Hx    Esophageal cancer Neg Hx    Stomach cancer Neg Hx    Rectal cancer Neg Hx    Breast cancer Neg Hx    Past Surgical History:  Procedure Laterality Date   ORIF ULNAR FRACTURE     TYMPANOSTOMY TUBE PLACEMENT  2009   Right   Social History   Social History Narrative   Regular Exercise-no   20 yrs in [place]   Son at [facility] [employer]   From [place].   Married HH  of 3    G1P1            Immunization History  Administered Date(s) Administered   Influenza,inj,Quad PF,6+ Mos 04/19/2013, 06/27/2015, 03/05/2016, 02/11/2017, 03/15/2018, 02/23/2019, 04/02/2020, 03/13/2021, 02/26/2022   PFIZER(Purple Top)SARS-COV-2 Vaccination 08/06/2019, 08/25/2019, 01/12/2020, 11/10/2020   PPD Test 07/21/2016   Tdap 05/31/2013   Zoster Recombinat (Shingrix) 01/23/2021, 06/11/2021     Objective: Vital Signs: BP 109/71 (BP Location: Right Arm, Patient Position: Sitting, Cuff Size: Normal)   Pulse 63   Ht 5\' 2"  (1.575 m)   Wt 153 lb 12.8 oz (69.8 kg)   BMI 28.13  kg/m    Physical Exam Vitals and nursing note reviewed.  Constitutional:      Appearance: She is well-developed.  HENT:     Head: Normocephalic and atraumatic.  Eyes:     Conjunctiva/sclera: Conjunctivae normal.  Cardiovascular:     Rate and Rhythm: Normal rate and regular rhythm.     Heart sounds: Normal heart sounds.  Pulmonary:     Effort: Pulmonary effort is normal.     Breath sounds: Normal breath sounds.  Abdominal:     General: Bowel sounds are normal.     Palpations: Abdomen is soft.  Musculoskeletal:     Cervical back: Normal range of motion.  Lymphadenopathy:     Cervical: No cervical adenopathy.  Skin:    General: Skin is warm and dry.     Capillary Refill: Capillary refill takes less than 2 seconds.  Neurological:     Mental Status:  She is alert and oriented to person, place, and time.  Psychiatric:        Behavior: Behavior normal.      Musculoskeletal Exam: Good range of motion of the cervical spine.  She had limited range of motion of her lumbar spine with some discomfort over the lower lumbar region.  There was no tenderness over SI joints.  Shoulder joints, elbow joints, wrist joints, MCPs PIPs and DIPs were in good range of motion with no synovitis.  Hip joints and knee joints were in good range of motion without any warmth swelling or effusion.  There was no tenderness over ankles or MTPs.  CDAI Exam: CDAI Score: -- Patient Global: 2 mm; Provider Global: 2 mm Swollen: --; Tender: -- Joint Exam 09/02/2022   No joint exam has been documented for this visit   There is currently no information documented on the homunculus. Go to the Rheumatology activity and complete the homunculus joint exam.  Investigation: No additional findings.  Imaging: VAS [place] LOWER EXTREMITY VENOUS (DVT)  Result Date: 08/21/2022  Lower Venous DVT Study Patient Name:  TRENISHA LAFAVOR  Date of Exam:   08/21/2022 Medical Rec #: [id number]           Accession #:    [id number] Date  of Birth: 01-Jul-1960           Patient Gender: F Patient Age:   30 years Exam Location:  [facility] Procedure:      VAS [place] LOWER EXTREMITY VENOUS (DVT) Referring Phys: Burna Mortimer [facility] --------------------------------------------------------------------------------  Indications: Pain, and tingling and numbness in feet, posterior aspect of the knee and calf. 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 Anticoagulation: Coumadin. Comparison Study: None. Performing Technologist: [employer] BS, RVT, RDCS  Examination Guidelines: A complete evaluation includes B-mode imaging, spectral Doppler, color Doppler, and power Doppler as needed of all accessible portions of each vessel. Bilateral testing is considered an integral part of a complete examination. Limited examinations for reoccurring indications may be performed as noted. The reflux portion of the exam is performed with the patient in reverse Trendelenburg.  +-----+---------------+---------+-----------+----------+--------------+ RIGHTCompressibilityPhasicitySpontaneityPropertiesThrombus Aging +-----+---------------+---------+-----------+----------+--------------+ CFV  Full           Yes      Yes                                 +-----+---------------+---------+-----------+----------+--------------+   +---------+---------------+---------+-----------+----------+--------------+ LEFT     CompressibilityPhasicitySpontaneityPropertiesThrombus Aging +---------+---------------+---------+-----------+----------+--------------+ CFV      Full           Yes      Yes                                 +---------+---------------+---------+-----------+----------+--------------+ SFJ      Full           Yes      Yes                                 +---------+---------------+---------+-----------+----------+--------------+ FV Prox   Full           Yes      Yes                                 +---------+---------------+---------+-----------+----------+--------------+  FV Mid   Full           Yes      Yes                                 +---------+---------------+---------+-----------+----------+--------------+ FV DistalFull           Yes      Yes                                 +---------+---------------+---------+-----------+----------+--------------+ PFV      Full           Yes      Yes                                 +---------+---------------+---------+-----------+----------+--------------+ POP      Full           Yes      Yes                                 +---------+---------------+---------+-----------+----------+--------------+ PTV      Full           Yes      Yes                                 +---------+---------------+---------+-----------+----------+--------------+ PERO     Full           Yes      Yes                                 +---------+---------------+---------+-----------+----------+--------------+ Gastroc  Full           Yes      Yes                                 +---------+---------------+---------+-----------+----------+--------------+ GSV      Full           Yes      Yes                                 +---------+---------------+---------+-----------+----------+--------------+ SSV      Full                                                        +---------+---------------+---------+-----------+----------+--------------+    Findings reported to Dr. Fabian Sharp at routed report thru Epic 12:16PM.  Summary: RIGHT: - No evidence of common femoral vein obstruction.  LEFT: - No evidence of deep vein thrombosis in the lower extremity. No indirect evidence of obstruction proximal to the inguinal ligament. - No cystic structure found in the popliteal fossa.  *See table(s) above for measurements and observations. Electronically signed by Waverly Ferrari MD on  08/21/2022 at 1:14:23 PM.    Final    DG Bone Density  Result Date: 08/11/2022 EXAM: DUAL  X-RAY ABSORPTIOMETRY (DXA) FOR BONE MINERAL DENSITY IMPRESSION: Referring Physician:  [name] Your patient completed a bone mineral density test using GE Lunar iDXA system (analysis version: 16). Technologist:    lmn PATIENT: Name: Marty, Picket Patient ID: [id number] Birth Date: 03-19-1961 Height: 62.4 in. Sex: Female Measured: 08/11/2022 Weight: 154.2 lbs. Indications: Estrogen Deficient, Osteoporosis (733), Plaquenil, Postmenopausal, Prilosec, Rheumatoid Arthritis (714.0) Fractures: None Treatments: Calcium (E943.0), Fosamax, Vitamin D (E933.5) ASSESSMENT: The BMD measured at Femur Neck Left is 0.656 g/cm2 with a T-score of -2.7. This patient is considered osteoporotic according to World Health Organization [facility]) criteria. The quality of the exam is good. Site Region Measured Date Measured Age YA BMD Significant CHANGE T-score DualFemur Neck Left  08/11/2022    64.1         -2.7    0.656 g/cm2 DualFemur Neck Left  02/02/2020    61.5         -2.8    0.649 g/cm2 AP Spine  L1-L4      08/11/2022    64.1         -1.5    1.005 g/cm2 AP Spine  L1-L4      02/02/2020    61.5         -1.5    1.004 g/cm2 DualFemur Total Mean 08/11/2022    64.1         -2.3    0.721 g/cm2 DualFemur Total Mean 02/02/2020    61.5         -2.3    0.722 g/cm2 World Health Organization [facility]) criteria for post-menopausal, Caucasian Women: Normal       T-score at or above -1 SD Osteopenia   T-score between -1 and -2.5 SD Osteoporosis T-score at or below -2.5 SD RECOMMENDATION: 1. All patients should optimize calcium and vitamin D intake. 2. Consider FDA-approved medical therapies in postmenopausal women and men aged 14 years and older, based on the following: a. A hip or vertebral (clinical or morphometric) fracture. b. T-score = -2.5 at the femoral neck or spine after appropriate evaluation to exclude secondary causes. c. Low bone mass (T-score  between -1.0 and -2.5 at the femoral neck or spine) and a 10-year probability of a hip fracture = 3% or a 10-year probability of a major osteoporosis-related fracture = 20% based on the [place]-adapted WHO algorithm. d. Clinician judgment and/or patient preferences may indicate treatment for people with 10-year fracture probabilities above or below these levels. FOLLOW-UP: Patients with diagnosis of osteoporosis or at high risk for fracture should have regular bone mineral density tests.? Patients eligible for Medicare are allowed routine testing every 2 years.? The testing frequency can be increased to one year for patients who have rapidly progressing disease, are receiving or discontinuing medical therapy to restore bone mass, or have additional risk factors. I have reviewed this study and agree with the findings. [facility] Electronically Signed   By: Romona Curls M.D.   On: 08/11/2022 16:22    Recent Labs: Lab Results  Component Value Date   WBC 10.2 08/25/2022   HGB 14.6 08/25/2022   PLT 220.0 08/25/2022   NA 139 08/25/2022   K 4.1 08/25/2022   CL 103 08/25/2022   CO2 28 08/25/2022   GLUCOSE 84 08/25/2022   BUN 11 08/25/2022   CREATININE 0.80 08/25/2022   BILITOT 0.7 08/25/2022   ALKPHOS 71 08/25/2022   AST 20 08/25/2022   ALT 15 08/25/2022   PROT 6.7 08/25/2022  PROT 7.2 08/25/2022   ALBUMIN 4.2 08/25/2022   CALCIUM 9.4 08/25/2022   GFRAA 96 07/26/2020   QFTBGOLDPLUS NEGATIVE 09/12/2020    Speciality Comments: PLQ Eye Exam: 02/19/2022 WNL @ [facility] Follow up 1 year Ttd with Fosamax for 5 years in the past Fosamax restarted 05/2020  Procedures:  No procedures performed Allergies: Boniva [ibandronate sodium], Ivp dye [iodinated contrast media], Other, Risedronate sodium, Risedronate sodium [risedronate sodium], and Macrobid [nitrofurantoin]   Assessment / Plan:     Visit Diagnoses: Rheumatoid arthritis 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  Patient had no synovitis on my examination.  She has been on methotrexate 8 tablets p.o. weekly along with folic acid and hydroxychloroquine 200 mg p.o. twice daily Monday to Friday.  Patient denies any history of joint swelling.  High risk medication use - Plaquenil 200 mg by mouth twice daily Monday to Friday, Methotrexate 8 tablets by mouth every week, and folic acid 2 mg by mouth daily. PLQ Eye Exam: 02/19/2022.  August 25, 2022 CBC and CMP were normal.  Vitamin D was 47.19.  TSH was normal.  SPEP was normal.  Labs were reviewed with the patient.  She was advised to get labs again in July and then every 3 months to monitor for drug toxicity.  Information on immunization was placed in the AVS.  She was advised to hold methotrexate if she develops an infection and resume after the infection resolves.  Sicca syndrome-she continues to sicca symptoms.  Over-the-counter products were discussed.  Primary osteoarthritis of both knees -she has intermittent discomfort in her knee joints.  She has no warmth swelling or effusion and no discomfort today.  X-rays on Sep 26, 2021 showed bilateral moderate osteoarthritis and moderate chondromalacia patella.  Primary osteoarthritis of both feet-proper fitting shoes were advised.  DDD (degenerative disc disease), lumbar - MRI report from [facility]--multilevel spondylosis and facet joint arthropathy.  She continues to have lower back pain.  She was recently given a prednisone taper by [facility].  She is planning to see Dr. [name] for ongoing lower back pain and lower extremity radiculopathy.  She also complains of paresthesias in her hands and her feet.  She has an appointment coming up with the neurologist.  Age related osteoporosis, unspecified pathological fracture presence - 02/02/2020 T score  -2.8, BMD 0.649 g/cm2. 08/11/22: The BMD measured at Femur Neck Left is 0.656 g/cm2 with a T-score of -2.7.  I did detailed  discussion regarding the bone density with the patient.  She restarted Fosamax in January 2022.  We will continue Fosamax for 3 years.  Her DEXA scan has been stable.  Fatty liver-LFTs were normal in April.  Other medical problems are listed as follows:  Raised intraocular pressure of both eyes  History of pulmonary embolism  History of hyperlipidemia  History of migraine  Frequent UTI  Orders: No orders of the defined types were placed in this encounter.  Meds ordered this encounter  Medications   hydroxychloroquine (PLAQUENIL) 200 MG tablet    Sig: Take 200mg  by mouth twice daily Monday through Friday only.    Dispense:  120 tablet    Refill:  0   methotrexate (RHEUMATREX) 2.5 MG tablet    Sig: Take 8 tablets (20 mg total) by mouth once a week.    Dispense:  96 tablet    Refill:  0    Follow-Up Instructions: Return in about 5 months (around 02/02/2023) for  Rheumatoid arthritis, Osteoarthritis.   Pollyann Savoy, MD  Note - This record has been created using Animal nutritionist.  Chart creation errors have been sought, but may not always  have been located. Such creation errors do not reflect on  the standard of medical care.

## 2022-08-19 NOTE — Patient Instructions (Addendum)
Ordering  doppler left leg to make sure no clot but pain could be  from the exercises for tendinitis.  Get lab appt.  Will do  neuro referral  as planned  Dr. Felecia Shelling preferred.  Tingling Doesn't seem to fit   the back pain problem but not sure.

## 2022-08-20 ENCOUNTER — Ambulatory Visit: Payer: BC Managed Care – PPO

## 2022-08-21 ENCOUNTER — Ambulatory Visit (HOSPITAL_COMMUNITY)
Admission: RE | Admit: 2022-08-21 | Discharge: 2022-08-21 | Disposition: A | Discharge status: Home / Self Care | Payer: BC Managed Care – PPO | Source: Ambulatory Visit | Attending: Internal Medicine | Admitting: Internal Medicine

## 2022-08-21 ENCOUNTER — Telehealth: Payer: Self-pay | Admitting: Neurology

## 2022-08-21 DIAGNOSIS — M79662 Pain in left lower leg: Secondary | ICD-10-CM

## 2022-08-21 NOTE — Telephone Encounter (Signed)
Patient being referred to [place] from PCP for numbness and tingling. Previously seen Dr. Brett Fairy in 2019 for vertigo/fatigue. Now requesting provider switch to Dr. Felecia Shelling. States she would like to see him because he sees her husband and son and her symptoms align more with what he specializes in. Please advise if this switch is acceptable. Thank you.

## 2022-08-25 ENCOUNTER — Other Ambulatory Visit (INDEPENDENT_AMBULATORY_CARE_PROVIDER_SITE_OTHER): Payer: BC Managed Care – PPO

## 2022-08-25 DIAGNOSIS — R202 Paresthesia of skin: Secondary | ICD-10-CM

## 2022-08-25 DIAGNOSIS — M0609 Rheumatoid arthritis without rheumatoid factor, multiple sites: Secondary | ICD-10-CM | POA: Diagnosis not present

## 2022-08-25 DIAGNOSIS — E538 Deficiency of other specified B group vitamins: Secondary | ICD-10-CM

## 2022-08-25 DIAGNOSIS — Z7901 Long term (current) use of anticoagulants: Secondary | ICD-10-CM | POA: Diagnosis not present

## 2022-08-25 DIAGNOSIS — E611 Iron deficiency: Secondary | ICD-10-CM

## 2022-08-25 DIAGNOSIS — Z79899 Other long term (current) drug therapy: Secondary | ICD-10-CM | POA: Diagnosis not present

## 2022-08-25 DIAGNOSIS — E559 Vitamin D deficiency, unspecified: Secondary | ICD-10-CM | POA: Diagnosis not present

## 2022-08-25 DIAGNOSIS — M79662 Pain in left lower leg: Secondary | ICD-10-CM

## 2022-08-25 LAB — MAGNESIUM: Magnesium: 2 mg/dL (ref 1.5–2.5)

## 2022-08-25 LAB — CBC WITH DIFFERENTIAL/PLATELET
Basophils Absolute: 0.1 10*3/uL (ref 0.0–0.1)
Basophils Relative: 0.6 % (ref 0.0–3.0)
Eosinophils Absolute: 0.1 10*3/uL (ref 0.0–0.7)
Eosinophils Relative: 1.1 % (ref 0.0–5.0)
HCT: 44.9 % (ref 36.0–46.0)
Hemoglobin: 14.6 g/dL (ref 12.0–15.0)
Lymphocytes Relative: 28.1 % (ref 12.0–46.0)
Lymphs Abs: 2.9 10*3/uL (ref 0.7–4.0)
MCHC: 32.5 g/dL (ref 30.0–36.0)
MCV: 85.3 fl (ref 78.0–100.0)
Monocytes Absolute: 0.8 10*3/uL (ref 0.1–1.0)
Monocytes Relative: 7.8 % (ref 3.0–12.0)
Neutro Abs: 6.4 10*3/uL (ref 1.4–7.7)
Neutrophils Relative %: 62.4 % (ref 43.0–77.0)
Platelets: 220 10*3/uL (ref 150.0–400.0)
RBC: 5.26 Mil/uL — ABNORMAL HIGH (ref 3.87–5.11)
RDW: 14.2 % (ref 11.5–15.5)
WBC: 10.2 10*3/uL (ref 4.0–10.5)

## 2022-08-25 LAB — COMPREHENSIVE METABOLIC PANEL
ALT: 15 U/L (ref 0–35)
AST: 20 U/L (ref 0–37)
Albumin: 4.2 g/dL (ref 3.5–5.2)
Alkaline Phosphatase: 71 U/L (ref 39–117)
BUN: 11 mg/dL (ref 6–23)
CO2: 28 mEq/L (ref 19–32)
Calcium: 9.4 mg/dL (ref 8.4–10.5)
Chloride: 103 mEq/L (ref 96–112)
Creatinine, Ser: 0.8 mg/dL (ref 0.40–1.20)
GFR: 78.01 mL/min (ref >60.00)
Glucose, Bld: 84 mg/dL (ref 70–99)
Potassium: 4.1 mEq/L (ref 3.5–5.1)
Sodium: 139 mEq/L (ref 135–145)
Total Bilirubin: 0.7 mg/dL (ref 0.2–1.2)
Total Protein: 7.2 g/dL (ref 6.0–8.3)

## 2022-08-25 LAB — VITAMIN B12: Vitamin B-12: 476 pg/mL (ref 211–911)

## 2022-08-25 LAB — LIPID PANEL
Cholesterol: 209 mg/dL — ABNORMAL HIGH (ref 0–200)
HDL: 80.7 mg/dL (ref >39.00)
LDL Cholesterol: 89 mg/dL (ref 0–99)
NonHDL: 127.98
Total CHOL/HDL Ratio: 3
Triglycerides: 193 mg/dL — ABNORMAL HIGH (ref 0.0–149.0)
VLDL: 38.6 mg/dL (ref 0.0–40.0)

## 2022-08-25 LAB — HEMOGLOBIN A1C: Hgb A1c MFr Bld: 6.1 % (ref 4.6–6.5)

## 2022-08-25 LAB — TSH: TSH: 2.84 u[IU]/mL (ref 0.35–5.50)

## 2022-08-25 LAB — T4, FREE: Free T4: 0.84 ng/dL (ref 0.60–1.60)

## 2022-08-25 LAB — VITAMIN D 25 HYDROXY (VIT D DEFICIENCY, FRACTURES): VITD: 47.19 ng/mL (ref 30.00–100.00)

## 2022-08-25 LAB — FOLATE: Folate: >23.8 ng/mL (ref >5.9)

## 2022-08-25 NOTE — Progress Notes (Signed)
No clot or obstruction  reassuring  findings

## 2022-08-26 ENCOUNTER — Other Ambulatory Visit: Payer: Self-pay | Admitting: Obstetrics and Gynecology

## 2022-08-26 DIAGNOSIS — Z1231 Encounter for screening mammogram for malignant neoplasm of breast: Secondary | ICD-10-CM

## 2022-08-26 NOTE — Progress Notes (Signed)
Chief Complaint  Patient presents with   Follow-up    Discuss lab results from last week Needing refill on Multigen    HPI: Patient  [name]  62 y.o. comes in today for  visit  and fu labs and left leg calf pain and leg pain  negative  Doppler for obstruction  See last week visit for leg pain among other sx  had negative  Tingling foot area   all worse in am  Back still is not bad "yet"  Left leg about the same  Prilosec  4 days per week.  For  fosomax.  Left eye twitch.  New   still on pred taper  pred makes her feel so much better for back pain  Still inquire about poss of chronic prednisone since such  a relief concern  of long term effect on quality of life with this pain recurring . Should she try alpraz ocass if needed.  At night for a few nights?   Dr Durenda Age  : takes  plaquenil and  MTX To see [facility] in next few weeks   Utd on pap June 23 Dr [facility]  Health Maintenance  Topic Date Due   PAP SMEAR-Modifier  06/21/2019   COVID-19 Vaccine (5 - 2023-24 season) 01/17/2022   INFLUENZA VACCINE  12/18/2022   DTaP/Tdap/Td (2 - Td or Tdap) 06/01/2023   MAMMOGRAM  11/06/2023   COLONOSCOPY (Pts 45-37yrs Insurance coverage will need to be confirmed)  07/19/2028   Hepatitis C Screening  Completed   HIV Screening  Completed   Zoster Vaccines- Shingrix  Completed   HPV VACCINES  Aged Out   ROS:  REST of 12 system review negative except as per HPI no bleeding burising unexpected    Past Medical History:  Diagnosis Date   Adjustment disorder with anxiety    B12 deficiency due to diet    is a vegetarian   Chest pain, unspecified    Chronic anticoagulation    Fatty liver    Per patient   GERD (gastroesophageal reflux disease)    Headache(784.0)    migraines and head pain   Hx of varicella    As Child   Iron deficiency anemia, unspecified    Otalgia, unspecified    Other and unspecified hyperlipidemia    Palpitations    Personal history of venous  thrombosis and embolism    Recurrent UTI    Rheumatoid arthritis(714.0)    deveshewar   Unspecified vitamin D deficiency    UTI (lower urinary tract infection) 11/28/2011   citrobacter       Past Surgical History:  Procedure Laterality Date   ORIF ULNAR FRACTURE     TYMPANOSTOMY TUBE PLACEMENT  2009   Right    Family History  Problem Relation Age of Onset   Parkinsonism Mother    Dementia Father    Stroke Father    Hyperlipidemia Father    Diabetes Father    Lymphoma Other    Colon cancer Neg Hx    Colon polyps Neg Hx    Esophageal cancer Neg Hx    Stomach cancer Neg Hx    Rectal cancer Neg Hx    Breast cancer Neg Hx     Social History   Socioeconomic History   Marital status: Married    Spouse name: Not on file   Number of children: 1   Years of education: Not on file   Highest education level: Bachelor's degree (e.g., BA,  AB, BS)  Occupational History   Occupation: home maker  Tobacco Use   Smoking status: Never    Passive exposure: Past   Smokeless tobacco: Never  Vaping Use   Vaping Use: Never used  Substance and Sexual Activity   Alcohol use: No   Drug use: Never   Sexual activity: Not on file  Other Topics Concern   Not on file  Social History Narrative   Regular Exercise-no   20 yrs in [place]   Son at [facility]   From [place].   Married HH  of 3    G1P1            Social Determinants of Health   Financial Resource Strain: Low Risk  (08/26/2022)   Overall Financial Resource Strain (CARDIA)    Difficulty of Paying Living Expenses: Not hard at all  Food Insecurity: No Food Insecurity (08/26/2022)   Hunger Vital Sign    Worried About Running Out of Food in the Last Year: Never true    Ran Out of Food in the Last Year: Never true  Transportation Needs: No Transportation Needs (08/26/2022)   PRAPARE - Administrator, Civil Service (Medical): No    Lack of Transportation (Non-Medical): No  Physical Activity: Insufficiently Active (08/26/2022)    Exercise Vital Sign    Days of Exercise per Week: 2 days    Minutes of Exercise per Session: 40 min  Stress: Stress Concern Present (08/26/2022)   [employer] of Occupational Health - Occupational Stress Questionnaire    Feeling of Stress : To some extent  Social Connections: Unknown (08/26/2022)   Social Connection and Isolation Panel [NHANES]    Frequency of Communication with Friends and Family: Twice a week    Frequency of Social Gatherings with Friends and Family: Patient declined    Attends Religious Services: Patient declined    Database administrator or Organizations: No    Attends Engineer, structural: Not on file    Marital Status: Married    Outpatient Medications Prior to Visit  Medication Sig Dispense Refill   alendronate (FOSAMAX) 70 MG tablet TAKE 1 TABLET(70 MG) BY MOUTH 1 TIME A WEEK WITH A FULL GLASS OF WATER AND ON AN EMPTY STOMACH 12 tablet 0   ALPRAZolam (XANAX) 0.25 MG tablet Take 1 tablet (0.25 mg total) by mouth 2 (two) times daily as needed for anxiety. 20 tablet 0   Calcium Carb-Cholecalciferol 600-800 MG-UNIT TABS Take by mouth.     Camphor-Menthol-Methyl Sal (SALONPAS EX) Apply topically as needed.     Cholecalciferol (VITAMIN D3) 2000 UNITS TABS Take 1 tablet by mouth daily.     Cranberry 1000 MG CAPS Take by mouth. 3 times a week     Cyanocobalamin (VITAMIN B-12 PO) Take by mouth once a week.     cyclobenzaprine (FLEXERIL) 5 MG tablet Take 0.5-1 tablets (2.5-5 mg total) by mouth at bedtime as needed for muscle spasms. 20 tablet 3   cycloSPORINE (RESTASIS) 0.05 % ophthalmic emulsion Place 1 drop into both eyes daily.     Eflornithine HCl 13.9 % cream Apply topically daily.      folic acid (FOLVITE) 1 MG tablet TAKE 2 TABLETS(2 MG) BY MOUTH DAILY 180 tablet 3   hydroxychloroquine (PLAQUENIL) 200 MG tablet Take  by mouth twice daily Monday through Friday only. 120 tablet 0   methotrexate (RHEUMATREX) 2.5 MG tablet Take 8 tablets (20 mg  total) by mouth once a week. 96 tablet  0   SUMAtriptan (IMITREX) 50 MG tablet TAKE ONE TABLET BY MOUTH AS NEEDED. MAY REPEAT IN 2 TO 4 HOURS IF NEEDED. 9 tablet 1   warfarin (COUMADIN) 2.5 MG tablet TAKE 2 TABLETS BY MOUTH DAILY, EXCEPT TAKE  1 TABLET ON SUNDAYS AND TAKE 1 1/2 TABLETS ON THURSDAYS OR AS DIRECTED BY ANTICOAGULATION CLINIC 180 tablet 1   mineral/vitamin supplement (MULTIGEN) 70 MG TABS tablet TAKE 1 TABLET EVERY DAY 90 tablet 1   nirmatrelvir/ritonavir (PAXLOVID) 20 x 150 MG & 10 x 100MG  TABS Patient GFR is 92 Take nirmatrelvir (150 mg) 2 tablet(s) twice daily for 5 days and ritonavir (100 mg) one tablet twice daily for 5 days.take  Medication together. (Patient not taking: Reported on 08/27/2022) 30 tablet 0   Omeprazole Magnesium (PRILOSEC PO) Take by mouth 4 (four) times a week. (Patient not taking: Reported on 08/27/2022)     predniSONE (STERAPRED UNI-PAK 21 TAB) 10 MG (21) TBPK tablet Take by mouth. (Patient not taking: Reported on 08/27/2022)     No facility-administered medications prior to visit.     EXAM:  BP 122/64   Pulse 73   Temp 98.1 F (36.7 C) (Oral)   Resp 16   Ht 5\' 2"  (1.575 m)   Wt 151 lb 6 oz (68.7 kg)   SpO2 97%   BMI 27.69 kg/m   Body mass index is 27.69 kg/m. Wt Readings from Last 3 Encounters:  08/27/22 151 lb 6 oz (68.7 kg)  08/19/22 152 lb 9.6 oz (69.2 kg)  06/10/22 156 lb 6.4 oz (70.9 kg)    Physical Exam: Vital signs reviewed ZOX:WRUEGEN:This is a well-developed well-nourished alert cooperative    who appearsr stated age in no acute distress.  Mildly anxious  cognition intact  HEENT: normocephalic atraumatic , Eyes: PERRL EOM's full, conjunctiva clear, Nares: paten,t no deformity discharge or tenderness.,  Extremities:  No clubbing cyanosis or edema, no acute joint swelling or redness no focal atrophy Rubs  left upper calf  no lesion  achilles without redness  or severe tenderness  gail seems easy  no redness or obv assymmetry  NEURO:  Oriented  x3, cranial nerves 3-12 appear to be intact, no obvious focal weakness,gait within normal limits no abnormal reflexes or asymmetrical sensation not tested  SKIN: No acute rashes normal turgor, color, no bruising or petechiae. PSYCH: Oriented, good eye contact anxiety about pain and concern  cognition and judgment appear normal.   Lab Results  Component Value Date   WBC 10.2 08/25/2022   HGB 14.6 08/25/2022   HCT 44.9 08/25/2022   PLT 220.0 08/25/2022   GLUCOSE 84 08/25/2022   CHOL 209 (H) 08/25/2022   TRIG 193.0 (H) 08/25/2022   HDL 80.70 08/25/2022   LDLDIRECT 97.3 05/25/2013   LDLCALC 89 08/25/2022   ALT 15 08/25/2022   AST 20 08/25/2022   NA 139 08/25/2022   K 4.1 08/25/2022   CL 103 08/25/2022   CREATININE 0.80 08/25/2022   BUN 11 08/25/2022   CO2 28 08/25/2022   TSH 2.84 08/25/2022   INR 1.4 (A) 08/13/2022   HGBA1C 6.1 08/25/2022    BP Readings from Last 3 Encounters:  08/27/22 122/64  08/19/22 110/78  06/10/22 111/74    Lab results reviewed with patient  medication counseling    ASSESSMENT AND PLAN:  Discussed the following assessment and plan:    ICD-10-CM   1. Pain of left calf  M79.662     2. Medication management  [id number]  3. Back pain with radiation  M54.9     4. Tingling of both feet / numbness left more than right  R20.2     5. Long term (current) use of anticoagulants  Z79.01     6. Rheumatoid arthritis of multiple sites with negative rheumatoid factor  M06.09     7. Medically complex patient  Z78.9     8. Anxiety  F41.9     9. Eye twitch  G24.5    recent has been on pred left? no facial spasm    Seems like mechanical cause of le pain   sx  still could be from back and calf achilles exercise?   Do not think this is a acute progressive  and currently alarm finding   Hses is very concerned about causes and if will get worse .  Do not advise chronic  pred unless advised byt Dr Algis Downs or [facility] depending on ongoing sx   If needed could  get second opinion about back Dr Danielle Dess Dr [name] etc  option of injections nerve blocks  presented to her  I cannot advise her  about best options Refill vitamins for now  can dec the iron ( see levels) Return for dependong on how doing and when due.  Patient Care Team: Eleanor Dimichele, Neta Mends, MD as PCP - General Pollyann Savoy, MD (Rheumatology) Sharrell Ku, MD (Gastroenterology) Newman Pies, MD (Otolaryngology) Dohmeier, Porfirio Mylar, MD (Neurology) Venancio Poisson, MD (Dermatology) Gerald Leitz, MD as Consulting Physician (Obstetrics and Gynecology) Anabel Bene, Huston Foley, MD as Referring Physician (Hematology and Oncology) Dairl Ponder, MD as Consulting Physician (Orthopedic Surgery) [facility] Jerilee Field, MD as Consulting Physician (Urology) Sheran Luz, MD as Consulting Physician (Physical Medicine and Rehabilitation) Patient Instructions  Ok to decrease iron  dosing  Talk with Dr Algis Downs and [name] also  Still suspect  perhaps  mechanical phenom.  Plus possible back.  Suspect the eye  twitch  could be  meds and nerves.       Neta Mends. Laird Runnion M.D.

## 2022-08-27 ENCOUNTER — Ambulatory Visit (INDEPENDENT_AMBULATORY_CARE_PROVIDER_SITE_OTHER): Payer: BC Managed Care – PPO | Admitting: Internal Medicine

## 2022-08-27 ENCOUNTER — Encounter: Payer: Self-pay | Admitting: Internal Medicine

## 2022-08-27 ENCOUNTER — Ambulatory Visit: Payer: BC Managed Care – PPO | Admitting: Psychology

## 2022-08-27 VITALS — BP 122/64 | HR 73 | Temp 98.1°F | Resp 16 | Ht 62.0 in | Wt 151.4 lb

## 2022-08-27 DIAGNOSIS — G245 Blepharospasm: Secondary | ICD-10-CM

## 2022-08-27 DIAGNOSIS — Z79899 Other long term (current) drug therapy: Secondary | ICD-10-CM | POA: Diagnosis not present

## 2022-08-27 DIAGNOSIS — F419 Anxiety disorder, unspecified: Secondary | ICD-10-CM

## 2022-08-27 DIAGNOSIS — M0609 Rheumatoid arthritis without rheumatoid factor, multiple sites: Secondary | ICD-10-CM

## 2022-08-27 DIAGNOSIS — Z7901 Long term (current) use of anticoagulants: Secondary | ICD-10-CM

## 2022-08-27 DIAGNOSIS — Z789 Other specified health status: Secondary | ICD-10-CM

## 2022-08-27 DIAGNOSIS — M79662 Pain in left lower leg: Secondary | ICD-10-CM

## 2022-08-27 DIAGNOSIS — M549 Dorsalgia, unspecified: Secondary | ICD-10-CM

## 2022-08-27 DIAGNOSIS — R202 Paresthesia of skin: Secondary | ICD-10-CM | POA: Diagnosis not present

## 2022-08-27 MED ORDER — MULTIGEN 70 MG PO TABS: 1.0000 | ORAL_TABLET | Freq: Every day | ORAL | 1 refills | Status: DC

## 2022-08-27 NOTE — Patient Instructions (Addendum)
Ok to decrease iron  dosing  Talk with Dr Algis Downs and [name] also  Still suspect  perhaps  mechanical phenom.  Plus possible back.  Suspect the eye  twitch  could be  meds and nerves.

## 2022-08-28 LAB — PROTEIN ELECTROPHORESIS, SERUM
Albumin ELP: 3.9 g/dL (ref 3.8–4.8)
Alpha 1: 0.2 g/dL (ref 0.2–0.3)
Alpha 2: 0.7 g/dL (ref 0.5–0.9)
Beta 2: 0.4 g/dL (ref 0.2–0.5)
Beta Globulin: 0.4 g/dL (ref 0.4–0.6)
Gamma Globulin: 1 g/dL (ref 0.8–1.7)
Total Protein: 6.7 g/dL (ref 6.1–8.1)

## 2022-08-28 LAB — IRON,TIBC AND FERRITIN PANEL
%SAT: 43 % (calc) (ref 16–45)
Ferritin: 201 ng/mL (ref 16–288)
Iron: 119 ug/dL (ref 45–160)
TIBC: 277 mcg/dL (calc) (ref 250–450)

## 2022-09-01 ENCOUNTER — Ambulatory Visit (INDEPENDENT_AMBULATORY_CARE_PROVIDER_SITE_OTHER): Payer: BC Managed Care – PPO

## 2022-09-01 DIAGNOSIS — Z7901 Long term (current) use of anticoagulants: Secondary | ICD-10-CM | POA: Diagnosis not present

## 2022-09-01 LAB — POCT INR: INR: 2.1 (ref 2.0–3.0)

## 2022-09-01 NOTE — Progress Notes (Signed)
Pt will eat a salad and then continue  5 mg (2 tablets) daily except take 2.5 mg (1 tablet) on Sundays and take 3.75 mg (1 1/2 tablet) on Thursdays. Recheck in 2 weeks.

## 2022-09-01 NOTE — Patient Instructions (Addendum)
Pre visit review using our clinic review tool, if applicable. No additional management support is needed unless otherwise documented below in the visit note.  Eat a salad today and then continue  5 mg (2 tablets) daily except take 2.5 mg (1 tablet) on Sundays and take 3.75 mg (1 1/2 tablet) on Thursdays. Recheck in 2 weeks.

## 2022-09-02 ENCOUNTER — Ambulatory Visit: Payer: BC Managed Care – PPO | Attending: Rheumatology | Admitting: Rheumatology

## 2022-09-02 ENCOUNTER — Other Ambulatory Visit: Payer: Self-pay | Admitting: Rheumatology

## 2022-09-02 ENCOUNTER — Encounter: Payer: Self-pay | Admitting: Rheumatology

## 2022-09-02 VITALS — BP 109/71 | HR 63 | Ht 62.0 in | Wt 153.8 lb

## 2022-09-02 DIAGNOSIS — Z8639 Personal history of other endocrine, nutritional and metabolic disease: Secondary | ICD-10-CM

## 2022-09-02 DIAGNOSIS — M0609 Rheumatoid arthritis without rheumatoid factor, multiple sites: Secondary | ICD-10-CM

## 2022-09-02 DIAGNOSIS — M5136 Other intervertebral disc degeneration, lumbar region: Secondary | ICD-10-CM

## 2022-09-02 DIAGNOSIS — M3500 Sicca syndrome, unspecified: Secondary | ICD-10-CM

## 2022-09-02 DIAGNOSIS — N390 Urinary tract infection, site not specified: Secondary | ICD-10-CM

## 2022-09-02 DIAGNOSIS — M170 Bilateral primary osteoarthritis of knee: Secondary | ICD-10-CM | POA: Diagnosis not present

## 2022-09-02 DIAGNOSIS — Z8669 Personal history of other diseases of the nervous system and sense organs: Secondary | ICD-10-CM

## 2022-09-02 DIAGNOSIS — M810 Age-related osteoporosis without current pathological fracture: Secondary | ICD-10-CM

## 2022-09-02 DIAGNOSIS — H40053 Ocular hypertension, bilateral: Secondary | ICD-10-CM

## 2022-09-02 DIAGNOSIS — M19071 Primary osteoarthritis, right ankle and foot: Secondary | ICD-10-CM

## 2022-09-02 DIAGNOSIS — K760 Fatty (change of) liver, not elsewhere classified: Secondary | ICD-10-CM

## 2022-09-02 DIAGNOSIS — Z86711 Personal history of pulmonary embolism: Secondary | ICD-10-CM

## 2022-09-02 DIAGNOSIS — Z79899 Other long term (current) drug therapy: Secondary | ICD-10-CM | POA: Diagnosis not present

## 2022-09-02 DIAGNOSIS — M19072 Primary osteoarthritis, left ankle and foot: Secondary | ICD-10-CM

## 2022-09-02 MED ORDER — METHOTREXATE SODIUM 2.5 MG PO TABS: 20.0000 mg | ORAL_TABLET | ORAL | 0 refills | Status: DC

## 2022-09-02 MED ORDER — HYDROXYCHLOROQUINE SULFATE 200 MG PO TABS: ORAL_TABLET | ORAL | 0 refills | Status: DC

## 2022-09-02 NOTE — Patient Instructions (Signed)
Standing Labs We placed an order today for your standing lab work.   Please have your standing labs drawn in July and every 3 months  Please have your labs drawn 2 weeks prior to your appointment so that the provider can discuss your lab results at your appointment, if possible.  Please note that you may see your imaging and lab results in MyChart before we have reviewed them. We will contact you once all results are reviewed. Please allow our office up to 72 hours to thoroughly review all of the results before contacting the office for clarification of your results.  WALK-IN LAB HOURS  Monday through Thursday from 8:00 am -12:30 pm and 1:00 pm-5:00 pm and Friday from 8:00 am-12:00 pm.  Patients with office visits requiring labs will be seen before walk-in labs.  You may encounter longer than normal wait times. Please allow additional time. Wait times may be shorter on  Monday and Thursday afternoons.  We do not book appointments for walk-in labs. We appreciate your patience and understanding with our staff.   Labs are drawn by [facility]. Please bring your co-pay at the time of your lab draw.  You may receive a bill from [facility] for your lab work.  Please note if you are on Hydroxychloroquine and and an order has been placed for a Hydroxychloroquine level,  you will need to have it drawn 4 hours or more after your last dose.  If you wish to have your labs drawn at another location, please call the office 24 hours in advance so we can fax the orders.  The office is located at [address], [address], [place], [place] [address]   If you have any questions regarding directions or hours of operation,  please call [phone].   As a reminder, please drink plenty of water prior to coming for your lab work. Thanks!   Vaccines You are taking a medication(s) that can suppress your immune system.  The following immunizations are recommended: Flu annually Covid-19  Td/Tdap (tetanus, diphtheria,  pertussis) every 10 years Pneumonia (Prevnar 15 then Pneumovax 23 at least 1 year apart.  Alternatively, can take Prevnar 20 without needing additional dose) Shingrix: 2 doses from 4 weeks to 6 months apart  Please check with your PCP to make sure you are up to date.   If you have signs or symptoms of an infection or start antibiotics: First, call your PCP for workup of your infection. Hold your medication through the infection, until you complete your antibiotics, and until symptoms resolve if you take the following: Injectable medication (Actemra, Benlysta, Cimzia, Cosentyx, Enbrel, Humira, Kevzara, Orencia, Remicade, Simponi, Stelara, Taltz, Tremfya) Methotrexate Leflunomide (Arava) Mycophenolate (Cellcept) Xeljanz, Olumiant, or Rinvoq  

## 2022-09-02 NOTE — Telephone Encounter (Signed)
Last Fill: 06/10/2022  Eye exam: 02/19/2022 WNL    Labs: 08/25/2022 RBC 5.26  Next Visit: 09/02/2022  Last Visit: 06/10/2022  [id number] arthritis of multiple sites with negative rheumatoid factor   Current Dose per office note 06/10/2022: Plaquenil 200 mg by mouth twice daily Monday to Friday,   Okay to refill Plaquenil?

## 2022-09-03 ENCOUNTER — Other Ambulatory Visit: Payer: Self-pay | Admitting: Rheumatology

## 2022-09-03 NOTE — Telephone Encounter (Signed)
Last Fill: 04/14/2022  Eye exam: 02/19/2022 WNL    Labs: 08/25/2022 RBC 5.26  Next Visit: 12/09/2022  Last Visit: 09/02/2022  [id number] arthritis of multiple sites with negative rheumatoid factor   Current Dose per office note 09/02/2022: Plaquenil 200 mg by mouth twice daily Monday to Friday   Okay to refill Plaquenil?

## 2022-09-08 ENCOUNTER — Other Ambulatory Visit: Payer: Self-pay | Admitting: Internal Medicine

## 2022-09-09 ENCOUNTER — Other Ambulatory Visit: Payer: Self-pay | Admitting: *Deleted

## 2022-09-09 MED ORDER — FOLIC ACID 1 MG PO TABS: ORAL_TABLET | ORAL | 3 refills | Status: DC

## 2022-09-09 MED ORDER — ALPRAZOLAM 0.25 MG PO TABS: 0.2500 mg | ORAL_TABLET | Freq: Two times a day (BID) | ORAL | 0 refills | Status: DC | PRN

## 2022-09-09 NOTE — Telephone Encounter (Signed)
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

## 2022-09-10 ENCOUNTER — Ambulatory Visit: Payer: BC Managed Care – PPO | Admitting: Psychology

## 2022-09-10 DIAGNOSIS — F4322 Adjustment disorder with anxiety: Secondary | ICD-10-CM | POA: Diagnosis not present

## 2022-09-10 NOTE — Progress Notes (Signed)
09/10/2022  Treatrment Plan:  Diagnosis F41.1 (Generalized anxiety disorder) [n/a]  Z62.820 (Parent child relationship problem) [n/a]  Symptoms Excessive and/or unrealistic worry that is difficult to control occurring more days than not for at least 6 months about a number of events or activities. (Status: maintained) -- No Description Entered  Regularly overindulges their child's wishes and demands. (Status: maintained) -- No Description Entered  Medication Status compliance  Safety none  If Suicidal or Homicidal State Action Taken: unspecified  Current Risk: low Medications unspecified Objectives Related Problem: Achieve a level of competent, effective parenting. Description: Increase the gradual letting go of their adolescent in constructive, affirmative ways. Target Date: 2022-11-13 Frequency: Daily Modality: individual Progress: 80%  Related Problem: Achieve a level of competent, effective parenting. Description: Identify unresolved childhood issues that affect parenting and work toward their resolution. Target Date: 2022-11-13 Frequency: Daily Modality: individual Progress: 80%  Related Problem: Achieve a level of competent, effective parenting. Description: Freely express feelings of frustration, helplessness, and inadequacy that each experiences in the parenting role. Target Date: 2022-11-13 Frequency: Daily Modality: individual Progress: 80%  Related Problem: Resolve the core conflict that is the source of anxiety. Description: Learn and implement problem-solving strategies for realistically addressing worries. Target Date: 2022-11-13 Frequency: Daily Modality: individual Progress: 75%  Related Problem: Resolve the core conflict that is the source of anxiety. Description: Learn and implement calming skills to reduce overall anxiety and manage anxiety symptoms. Target Date: 2022-11-13 Frequency:  Daily Modality: individual Progress: 70%  Related Problem: Resolve the core conflict that is the source of anxiety. Description: Describe situations, thoughts, feelings, and actions associated with anxieties and worries, their impact on functioning, and attempts to resolve them. Target Date: 2022-11-13 Frequency: Daily Modality: individual Progress: 70%  Client Response full compliance  Service Location Location, [address] [place], [place] [address]  Service Code cpt [phone]  Behavioral activation plan  Facilitate problem solving  Identify automatic thoughts  Emotion regulation skills  Provide education, information  Self care activities  Relaxation training  Lifestyle change (exercise, nutrition)  Self-monitoring  Validate/empathize  Session notes: F 41.1  Goals: She has chronic anxieties, mostly related to health concerns and her son's well-being. Would like to develop strategies to manage symptoms of anxiety. Needs to understand enmeshment and learn to define and manage appropriate family boundaries. Target date is 12-23. Patient has realized significant improvement in recognizing and establishing appropriate boundaries. Anxiety about health and family matters persist and she desires to continue treatment in an effort to manage and reduce these feelings. Revised target date is 6-24.  Meds: Xanax (.25 mg);   Patient agrees to a video session. She is at home and I am in my home office.   Briseyda and [name]: [name] is very excited to report that he did a social event, even though he had a paper due. Ended up being up all night to complete the paper. He recognizes he fell into his regular pattern of over-control and it wasn't completely necessary. And he sees that he can socialize and feel comfortable. This is very gratifying for Kyndahl and helps relieve some (minimal) of her anxiety. Debby says that she is anxious about her son's job prospects. She has a hard time  not advising him on next steps. She is far more distressed about his  job situation than he is. Worked with her to "let go" and trust her son's instincts.                                                  [name], PhD 3:15p-4:00p 45 min

## 2022-09-14 ENCOUNTER — Encounter: Payer: Self-pay | Admitting: Rheumatology

## 2022-09-15 ENCOUNTER — Ambulatory Visit (INDEPENDENT_AMBULATORY_CARE_PROVIDER_SITE_OTHER): Payer: BC Managed Care – PPO

## 2022-09-15 DIAGNOSIS — Z7901 Long term (current) use of anticoagulants: Secondary | ICD-10-CM

## 2022-09-15 LAB — POCT INR: INR: 2.2 (ref 2.0–3.0)

## 2022-09-15 NOTE — Patient Instructions (Addendum)
Pre visit review using our clinic review tool, if applicable. No additional management support is needed unless otherwise documented below in the visit note.  Decrease dose today to take 1 tablet and then change weekly dose to take 5 mg (2 tablets) daily except take 2.5 mg (1 tablet) on Sundays and Thursdays. Recheck in 2 weeks.

## 2022-09-15 NOTE — Progress Notes (Unsigned)
Office Visit Note  Patient: [name]             Date of Birth: Sep 03, 1960           MRN: [id number]             PCP: [name], MD Referring: [name], MD Visit Date: 09/16/2022 Occupation: @GUAROCC @  Subjective:  No chief complaint on file.   History of Present Illness: [name] is a 62 y.o. female ***     Activities of Daily Living:  Patient reports morning stiffness for *** {minute/hour:19697}.   Patient {ACTIONS;DENIES/REPORTS:21021675::"Denies"} nocturnal pain.  Difficulty dressing/grooming: {ACTIONS;DENIES/REPORTS:21021675::"Denies"} Difficulty climbing stairs: {ACTIONS;DENIES/REPORTS:21021675::"Denies"} Difficulty getting out of chair: {ACTIONS;DENIES/REPORTS:21021675::"Denies"} Difficulty using hands for taps, buttons, cutlery, and/or writing: {ACTIONS;DENIES/REPORTS:21021675::"Denies"}  No Rheumatology ROS completed.   PMFS History:  Patient Active Problem List   Diagnosis Date Noted  . 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HCC) 01/06/2018  . Long term (current) use of anticoagulants 02/11/2017  . Sicca syndrome (HCC) 12/22/2016  . ANA positive 12/22/2016  . 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 Migraine 03/31/2016  . Anxiety 03/31/2016  . High risk medication use 03/31/2016  . Primary osteoarthritis of both feet 03/31/2016  . Primary osteoarthritis of both knees 03/31/2016  . Stiffness of left hand joint 11/27/2014  . Elevated IOP 06/09/2014  . Long term current use of systemic steroids 06/09/2014  . Medically complex patient 06/09/2014  . Current use of steroid medication 01/09/2014  . Xerosis of skin 12/07/2013  . Encounter for therapeutic drug monitoring 06/09/2013  . B12 deficiency 05/31/2013  . Iron deficiency 05/31/2013  . Visit for preventive health examination 05/25/2012  . Mammogram abnormal 04/12/2012  . 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 Vaginal atrophy 01/23/2012  . History of recurrent UTI  (urinary tract infection) 01/23/2012  . Osteoporosis 04/29/2011  . Nonspecific abnormal results of liver function study 12/08/2010  . Chronic anticoagulation   . Anticoagulant long-term use 07/15/2010  . 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 Chronic cough 03/19/2009  . CHEST PAIN-UNSPECIFIED 08/26/2008  . PALPITATIONS, RECURRENT 07/24/2008  . Vitamin D deficiency 05/30/2008  . HYPERLIPIDEMIA 05/30/2008  . IRON DEFICIENCY 05/30/2008  . Adjustment disorder with anxiety 05/30/2008  . Rheumatoid arthritis (HCC) 05/30/2008  . PULMONARY EMBOLISM, HX OF 05/30/2008    Past Medical History:  Diagnosis Date  . Adjustment disorder with anxiety   . B12 deficiency due to diet    is a vegetarian  . Chest pain, unspecified   . Chronic anticoagulation   . Fatty liver    Per patient  . GERD (gastroesophageal reflux disease)   . Headache(784.0)    migraines and head pain  . Hx of varicella    As Child  . Iron deficiency anemia, unspecified   . Otalgia, unspecified   . Other and unspecified hyperlipidemia   . Palpitations   . Personal history of venous thrombosis and embolism   . Recurrent UTI   . Rheumatoid arthritis(714.0)    deveshewar  . Unspecified vitamin D deficiency   . UTI (lower urinary tract infection) 11/28/2011   citrobacter       Family History  Problem Relation Age of Onset  . Parkinsonism Mother   . Dementia Father   . Stroke Father   . Hyperlipidemia Father   . Diabetes Father   . Lymphoma Other   . Colon cancer Neg Hx   . Colon polyps Neg Hx   . Esophageal cancer Neg  Hx   . Stomach cancer Neg Hx   . Rectal cancer Neg Hx   . Breast cancer Neg Hx    Past Surgical History:  Procedure Laterality Date  . ORIF ULNAR FRACTURE    . TYMPANOSTOMY TUBE PLACEMENT  2009   Right   Social History   Social History Narrative   Regular Exercise-no   20 yrs in [place]   Son at [facility] [facility] [employer]   From [place].   Married HH  of 3    G1P1            Immunization History   Administered Date(s) Administered  . Influenza,inj,Quad PF,6+ Mos 04/19/2013, 06/27/2015, 03/05/2016, 02/11/2017, 03/15/2018, 02/23/2019, 04/02/2020, 03/13/2021, 02/26/2022  . PFIZER(Purple Top)SARS-COV-2 Vaccination 08/06/2019, 08/25/2019, 01/12/2020, 11/10/2020  . PPD Test 07/21/2016  . Tdap 05/31/2013  . Zoster Recombinat (Shingrix) 01/23/2021, 06/11/2021     Objective: Vital Signs: There were no vitals taken for this visit.   Physical Exam   Musculoskeletal Exam: ***  CDAI Exam: CDAI Score: -- Patient Global: --; Provider Global: -- Swollen: --; Tender: -- Joint Exam 09/16/2022   No joint exam has been documented for this visit   There is currently no information documented on the homunculus. Go to the Rheumatology activity and complete the homunculus joint exam.  Investigation: No additional findings.  Imaging: VAS [place] LOWER EXTREMITY VENOUS (DVT)  Result Date: 08/21/2022  Lower Venous DVT Study Patient Name:  [name]  Date of Exam:   08/21/2022 Medical Rec #: [id number]           Accession #:    [id number] Date of Birth: 10/23/1960           Patient Gender: F Patient Age:   52 years Exam Location:  [facility] Procedure:      VAS [place] LOWER EXTREMITY VENOUS (DVT) Referring Phys: Burna Mortimer [facility] --------------------------------------------------------------------------------  Indications: Pain, and tingling and numbness in feet, posterior aspect of the knee and calf. 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 Anticoagulation: Coumadin. Comparison Study: None. Performing Technologist: [employer] BS, RVT, RDCS  Examination Guidelines: A complete evaluation includes B-mode imaging, spectral Doppler, color Doppler, and power Doppler as needed of all accessible portions of each vessel. Bilateral testing is considered an integral part of a complete  examination. Limited examinations for reoccurring indications may be performed as noted. The reflux portion of the exam is performed with the patient in reverse Trendelenburg.  +-----+---------------+---------+-----------+----------+--------------+ RIGHTCompressibilityPhasicitySpontaneityPropertiesThrombus Aging +-----+---------------+---------+-----------+----------+--------------+ CFV  Full           Yes      Yes                                 +-----+---------------+---------+-----------+----------+--------------+   +---------+---------------+---------+-----------+----------+--------------+ LEFT     CompressibilityPhasicitySpontaneityPropertiesThrombus Aging +---------+---------------+---------+-----------+----------+--------------+ CFV      Full           Yes      Yes                                 +---------+---------------+---------+-----------+----------+--------------+ SFJ      Full           Yes      Yes                                 +---------+---------------+---------+-----------+----------+--------------+  FV Prox  Full           Yes      Yes                                 +---------+---------------+---------+-----------+----------+--------------+ FV Mid   Full           Yes      Yes                                 +---------+---------------+---------+-----------+----------+--------------+ FV DistalFull           Yes      Yes                                 +---------+---------------+---------+-----------+----------+--------------+ PFV      Full           Yes      Yes                                 +---------+---------------+---------+-----------+----------+--------------+ POP      Full           Yes      Yes                                 +---------+---------------+---------+-----------+----------+--------------+ PTV      Full           Yes      Yes                                  +---------+---------------+---------+-----------+----------+--------------+ PERO     Full           Yes      Yes                                 +---------+---------------+---------+-----------+----------+--------------+ Gastroc  Full           Yes      Yes                                 +---------+---------------+---------+-----------+----------+--------------+ GSV      Full           Yes      Yes                                 +---------+---------------+---------+-----------+----------+--------------+ SSV      Full                                                        +---------+---------------+---------+-----------+----------+--------------+    Findings reported to Dr. Fabian Sharp at routed report thru Epic 12:16PM.  Summary: RIGHT: - No evidence of common femoral vein obstruction.  LEFT: - No evidence of deep vein thrombosis in the lower extremity.  No indirect evidence of obstruction proximal to the inguinal ligament. - No cystic structure found in the popliteal fossa.  *See table(s) above for measurements and observations. Electronically signed by Waverly Ferrari MD on 08/21/2022 at 1:14:23 PM.    Final     Recent Labs: Lab Results  Component Value Date   WBC 10.2 08/25/2022   HGB 14.6 08/25/2022   PLT 220.0 08/25/2022   NA 139 08/25/2022   K 4.1 08/25/2022   CL 103 08/25/2022   CO2 28 08/25/2022   GLUCOSE 84 08/25/2022   BUN 11 08/25/2022   CREATININE 0.80 08/25/2022   BILITOT 0.7 08/25/2022   ALKPHOS 71 08/25/2022   AST 20 08/25/2022   ALT 15 08/25/2022   PROT 6.7 08/25/2022   PROT 7.2 08/25/2022   ALBUMIN 4.2 08/25/2022   CALCIUM 9.4 08/25/2022   GFRAA 96 07/26/2020   QFTBGOLDPLUS NEGATIVE 09/12/2020    Speciality Comments: PLQ Eye Exam: 02/19/2022 WNL @ [facility] Follow up 1 year Ttd with Fosamax for 5 years in the past Fosamax restarted 05/2020  Procedures:  No procedures performed Allergies: Boniva [ibandronate sodium], Ivp dye  [iodinated contrast media], Other, Risedronate sodium, Risedronate sodium [risedronate sodium], and Macrobid [nitrofurantoin]   Assessment / Plan:     Visit Diagnoses: No diagnosis found.  Orders: No orders of the defined types were placed in this encounter.  No orders of the defined types were placed in this encounter.   Face-to-face time spent with patient was *** minutes. Greater than 50% of time was spent in counseling and coordination of care.  Follow-Up Instructions: No follow-ups on file.   Ellen Henri, CMA  Note - This record has been created using Animal nutritionist.  Chart creation errors have been sought, but may not always  have been located. Such creation errors do not reflect on  the standard of medical care.

## 2022-09-15 NOTE — Telephone Encounter (Signed)
We can schedule appointment  to evaluate.

## 2022-09-15 NOTE — Progress Notes (Addendum)
Pt reported "knot" on the back of L knee which is painful and she is taking tylenol to help with pain. It has been helping. She is concerned so has made an apt with PCP and rheumatology for tomorrow. Pt is concerned because she has to fly to her son's graduation this week. No warmth or redness. Advised pt if she developed any new symptoms to contact the office. Pt verbalized understanding.  Decrease dose today to take 1 tablet and then change weekly dose to take 5 mg (2 tablets) daily except take 2.5 mg (1 tablet) on Sundays and Thursdays. Recheck in 2 weeks.

## 2022-09-15 NOTE — Telephone Encounter (Signed)
Patient is scheduled for 09/16/2022 at 1:40pm for eval.

## 2022-09-16 ENCOUNTER — Ambulatory Visit: Payer: BC Managed Care – PPO | Admitting: Internal Medicine

## 2022-09-16 ENCOUNTER — Encounter: Payer: Self-pay | Admitting: Rheumatology

## 2022-09-16 ENCOUNTER — Ambulatory Visit: Payer: BC Managed Care – PPO | Attending: Rheumatology | Admitting: Rheumatology

## 2022-09-16 VITALS — BP 117/81 | HR 62 | Resp 16 | Ht 62.0 in | Wt 153.6 lb

## 2022-09-16 DIAGNOSIS — M19071 Primary osteoarthritis, right ankle and foot: Secondary | ICD-10-CM

## 2022-09-16 DIAGNOSIS — Z8639 Personal history of other endocrine, nutritional and metabolic disease: Secondary | ICD-10-CM

## 2022-09-16 DIAGNOSIS — Z86711 Personal history of pulmonary embolism: Secondary | ICD-10-CM

## 2022-09-16 DIAGNOSIS — M0609 Rheumatoid arthritis without rheumatoid factor, multiple sites: Secondary | ICD-10-CM | POA: Diagnosis not present

## 2022-09-16 DIAGNOSIS — Z8669 Personal history of other diseases of the nervous system and sense organs: Secondary | ICD-10-CM

## 2022-09-16 DIAGNOSIS — M3500 Sicca syndrome, unspecified: Secondary | ICD-10-CM

## 2022-09-16 DIAGNOSIS — M170 Bilateral primary osteoarthritis of knee: Secondary | ICD-10-CM

## 2022-09-16 DIAGNOSIS — M5136 Other intervertebral disc degeneration, lumbar region: Secondary | ICD-10-CM

## 2022-09-16 DIAGNOSIS — H40053 Ocular hypertension, bilateral: Secondary | ICD-10-CM

## 2022-09-16 DIAGNOSIS — K760 Fatty (change of) liver, not elsewhere classified: Secondary | ICD-10-CM

## 2022-09-16 DIAGNOSIS — M19072 Primary osteoarthritis, left ankle and foot: Secondary | ICD-10-CM

## 2022-09-16 DIAGNOSIS — M79605 Pain in left leg: Secondary | ICD-10-CM

## 2022-09-16 DIAGNOSIS — M51369 Other intervertebral disc degeneration, lumbar region without mention of lumbar back pain or lower extremity pain: Secondary | ICD-10-CM

## 2022-09-16 DIAGNOSIS — Z79899 Other long term (current) drug therapy: Secondary | ICD-10-CM

## 2022-09-16 DIAGNOSIS — M810 Age-related osteoporosis without current pathological fracture: Secondary | ICD-10-CM

## 2022-09-16 DIAGNOSIS — N390 Urinary tract infection, site not specified: Secondary | ICD-10-CM

## 2022-09-17 ENCOUNTER — Ambulatory Visit: Payer: BC Managed Care – PPO | Admitting: Internal Medicine

## 2022-09-24 ENCOUNTER — Ambulatory Visit (INDEPENDENT_AMBULATORY_CARE_PROVIDER_SITE_OTHER): Payer: BC Managed Care – PPO | Admitting: Internal Medicine

## 2022-09-24 ENCOUNTER — Ambulatory Visit: Payer: BC Managed Care – PPO | Admitting: Psychology

## 2022-09-24 ENCOUNTER — Encounter: Payer: Self-pay | Admitting: Internal Medicine

## 2022-09-24 VITALS — BP 100/62 | HR 61 | Temp 97.7°F | Ht 62.0 in | Wt 151.8 lb

## 2022-09-24 DIAGNOSIS — M7918 Myalgia, other site: Secondary | ICD-10-CM

## 2022-09-24 DIAGNOSIS — F4322 Adjustment disorder with anxiety: Secondary | ICD-10-CM

## 2022-09-24 DIAGNOSIS — Z7901 Long term (current) use of anticoagulants: Secondary | ICD-10-CM

## 2022-09-24 DIAGNOSIS — Z9181 History of falling: Secondary | ICD-10-CM

## 2022-09-24 DIAGNOSIS — S40212A Abrasion of left shoulder, initial encounter: Secondary | ICD-10-CM

## 2022-09-24 DIAGNOSIS — M549 Dorsalgia, unspecified: Secondary | ICD-10-CM

## 2022-09-24 NOTE — Progress Notes (Signed)
Chief Complaint  Patient presents with   Fall    Pt reports she fell on left shoulder last Thursday. Pt reports she tried to open a freezer and fell. Denied dizziness.    Back Pain    Pt states she wants to discuss with provider on taking tylenol for back pain.     HPI: [name] 62 y.o. come in for  fu of above  first time fall  pulling put stuck upper freezer and fell on shoulder    Thursday had  6 days ago when at sons graduation trip grad from [place]  fell back and onto left post shoulder and lft back  no loc pain but went to ceremony .  Now should iseems fine  but son wanter her to get checked Back issues pt now backed off  saw dr [name] advise try injections or ablations   has pain tender left buttocks but no change in walling  Took tylenol. ? About doing ROS: See pertinent positives and negatives per HPI.  Past Medical History:  Diagnosis Date   Adjustment disorder with anxiety    B12 deficiency due to diet    is a vegetarian   Chest pain, unspecified    Chronic anticoagulation    Fatty liver    Per patient   GERD (gastroesophageal reflux disease)    Headache(784.0)    migraines and head pain   Hx of varicella    As Child   Iron deficiency anemia, unspecified    Otalgia, unspecified    Other and unspecified hyperlipidemia    Palpitations    Personal history of venous thrombosis and embolism    Recurrent UTI    Rheumatoid arthritis(714.0)    deveshewar   Unspecified vitamin D deficiency    UTI (lower urinary tract infection) 11/28/2011   citrobacter       Family History  Problem Relation Age of Onset   Parkinsonism Mother    Dementia Father    Stroke Father    Hyperlipidemia Father    Diabetes Father    Lymphoma Other    Colon cancer Neg Hx    Colon polyps Neg Hx    Esophageal cancer Neg Hx    Stomach cancer Neg Hx    Rectal cancer Neg Hx    Breast cancer Neg Hx     Social History   Socioeconomic History   Marital status: Married     Spouse name: Not on file   Number of children: 1   Years of education: Not on file   Highest education level: Bachelor's degree (e.g., BA, AB, BS)  Occupational History   Occupation: home maker  Tobacco Use   Smoking status: Never    Passive exposure: Past   Smokeless tobacco: Never  Vaping Use   Vaping Use: Never used  Substance and Sexual Activity   Alcohol use: No   Drug use: Never   Sexual activity: Not on file  Other Topics Concern   Not on file  Social History Narrative   Regular Exercise-no   20 yrs in [place]   Son at [facility]   From [place].   Married HH  of 3    G1P1            Social Determinants of Health   Financial Resource Strain: Low Risk  (08/26/2022)   Overall Financial Resource Strain (CARDIA)    Difficulty of Paying Living Expenses: Not hard at all  Food Insecurity: No Food Insecurity (08/26/2022)  Hunger Vital Sign    Worried About Running Out of Food in the Last Year: Never true    Ran Out of Food in the Last Year: Never true  Transportation Needs: No Transportation Needs (08/26/2022)   PRAPARE - Administrator, Civil Service (Medical): No    Lack of Transportation (Non-Medical): No  Physical Activity: Insufficiently Active (08/26/2022)   Exercise Vital Sign    Days of Exercise per Week: 2 days    Minutes of Exercise per Session: 40 min  Stress: Stress Concern Present (08/26/2022)   [employer] of Occupational Health - Occupational Stress Questionnaire    Feeling of Stress : To some extent  Social Connections: Unknown (08/26/2022)   Social Connection and Isolation Panel [NHANES]    Frequency of Communication with Friends and Family: Twice a week    Frequency of Social Gatherings with Friends and Family: Patient declined    Attends Religious Services: Patient declined    Database administrator or Organizations: No    Attends Engineer, structural: Not on file    Marital Status: Married    Outpatient Medications Prior to Visit   Medication Sig Dispense Refill   acetaminophen (TYLENOL) 500 MG tablet Take 500 mg by mouth as needed.     alendronate (FOSAMAX) 70 MG tablet TAKE 1 TABLET(70 MG) BY MOUTH 1 TIME A WEEK WITH A FULL GLASS OF WATER AND ON AN EMPTY STOMACH 12 tablet 0   ALPRAZolam (XANAX) 0.25 MG tablet Take 1 tablet (0.25 mg total) by mouth 2 (two) times daily as needed for anxiety. 20 tablet 0   Calcium Carb-Cholecalciferol 600-800 MG-UNIT TABS Take by mouth.     Camphor-Menthol-Methyl Sal (SALONPAS EX) Apply topically as needed.     Cholecalciferol (VITAMIN D3) 2000 UNITS TABS Take 1 tablet by mouth daily.     Cranberry 1000 MG CAPS Take by mouth. 3 times a week     Cyanocobalamin (VITAMIN B-12 PO) Take by mouth once a week.     cycloSPORINE (RESTASIS) 0.05 % ophthalmic emulsion Place 1 drop into both eyes daily.     Eflornithine HCl 13.9 % cream Apply topically daily.      folic acid (FOLVITE) 1 MG tablet TAKE 2 TABLETS(2 MG) BY MOUTH DAILY 180 tablet 3   hydroxychloroquine (PLAQUENIL) 200 MG tablet TAKE 1 TABLET BY MOUTH TWICE DAILY MONDAY THROUGH FRIDAY ONLY 120 tablet 0   methotrexate (RHEUMATREX) 2.5 MG tablet Take 8 tablets (20 mg total) by mouth once a week. 96 tablet 0   mineral/vitamin supplement (MULTIGEN) 70 MG TABS tablet Take 1 tablet (70 mg total) by mouth daily. 90 tablet 1   Omeprazole Magnesium (PRILOSEC PO) Take by mouth 4 (four) times a week.     SUMAtriptan (IMITREX) 50 MG tablet TAKE ONE TABLET BY MOUTH AS NEEDED. MAY REPEAT IN 2 TO 4 HOURS IF NEEDED. 9 tablet 1   warfarin (COUMADIN) 2.5 MG tablet TAKE 2 TABLETS BY MOUTH DAILY, EXCEPT TAKE  1 TABLET ON SUNDAYS AND TAKE 1 1/2 TABLETS ON THURSDAYS OR AS DIRECTED BY ANTICOAGULATION CLINIC 180 tablet 1   cyclobenzaprine (FLEXERIL) 5 MG tablet Take 0.5-1 tablets (2.5-5 mg total) by mouth at bedtime as needed for muscle spasms. (Patient not taking: Reported on 09/24/2022) 20 tablet 3   nirmatrelvir/ritonavir (PAXLOVID) 20 x 150 MG & 10 x 100MG  TABS  Patient GFR is 92 Take nirmatrelvir (150 mg) 2 tablet(s) twice daily for 5 days and ritonavir (100 mg)  one tablet twice daily for 5 days.take  Medication together. (Patient not taking: Reported on 08/27/2022) 30 tablet 0   predniSONE (STERAPRED UNI-PAK 21 TAB) 10 MG (21) TBPK tablet Take by mouth. (Patient not taking: Reported on 08/27/2022)     hydroxychloroquine (PLAQUENIL) 200 MG tablet Take 200mg  by mouth twice daily Monday through Friday only. (Patient not taking: Reported on 09/16/2022) 120 tablet 0   No facility-administered medications prior to visit.     EXAM:  BP 100/62 (BP Location: Right Arm, Patient Position: Sitting, Cuff Size: Normal)   Pulse 61   Temp 97.7 F (36.5 C) (Oral)   Ht 5\' 2"  (1.575 m)   Wt 151 lb 12.8 oz (68.9 kg)   SpO2 97%   BMI 27.76 kg/m   Body mass index is 27.76 kg/m.  GENERAL: vitals reviewed and listed above, alert, oriented, appears well hydrated and in no acute distress HEENT: atraumatic, conjunctiva  clear, no obvious abnormalities on inspection of external nose and ears NECK: no obvious masses on inspection palpation  MS: moves all extremities without noticeable focal  abnormality post leg shoulder a superficial  healing scratch abrasion and mostly yellow some green bruising  post shoulder  without mass effect   neg shoulder point tenderness and full rom  rotation and  cross body.  Back midline faint yellow area  left buttolcks no mass and no lesion  gait pretty normal   PSYCH: pleasant and cooperative, Lab Results  Component Value Date   WBC 10.2 08/25/2022   HGB 14.6 08/25/2022   HCT 44.9 08/25/2022   PLT 220.0 08/25/2022   GLUCOSE 84 08/25/2022   CHOL 209 (H) 08/25/2022   TRIG 193.0 (H) 08/25/2022   HDL 80.70 08/25/2022   LDLDIRECT 97.3 05/25/2013   LDLCALC 89 08/25/2022   ALT 15 08/25/2022   AST 20 08/25/2022   NA 139 08/25/2022   K 4.1 08/25/2022   CL 103 08/25/2022   CREATININE 0.80 08/25/2022   BUN 11 08/25/2022   CO2 28  08/25/2022   TSH 2.84 08/25/2022   INR 2.2 09/15/2022   HGBA1C 6.1 08/25/2022   BP Readings from Last 3 Encounters:  09/24/22 100/62  09/16/22 117/81  09/02/22 109/71    ASSESSMENT AND PLAN:  Discussed the following assessment and plan:  Abrasion of left shoulder, initial encounter - c bruising resolviong and no complicaiton seen exrernal to joint  and other structures has full function  History of fall - left buttock pain ? if related to back  Left buttock pain - related to fall no mass or bruising  Anticoagulant long-term use  Back pain with radiation - Pt and prednisone seem to help Suspect  left leg issues contributed  no alarming injury today and balance seems good. Local care time  and fu contact next week if not continuing to improve   do not think x ray would be helpful at this time Suspect back issues is a bigger problem  See neuro and if opinions jive may want to proceed if p ain back for block  or injection.  But no rush..tylneol max 2000mg  per day should be safe  as needed for pain -Patient advised to return or notify health care team  if  new concerns arise.  Patient Instructions  Glad you should her has normal function    and fortunately seems ton involve there shoulder joint.  Let us know if  getting worse .   Dont use heat  on injury , for  now . Can get back to exercise after a week if doing ok . And no excess pain .   Consider  making sure  exercises that optimizes balance .  Classes such as tai chi are helpful also .  Can do x ray If needed  but do not think a fracture .      Neta Mends. Marijose Curington M.D.

## 2022-09-24 NOTE — Patient Instructions (Addendum)
Glad you should her has normal function    and fortunately seems ton involve there shoulder joint.  Let us know if  getting worse .   Dont use heat  on injury , for now . Can get back to exercise after a week if doing ok . And no excess pain .   Consider  making sure  exercises that optimizes balance .  Classes such as tai chi are helpful also .  Can do x ray If needed  but do not think a fracture .

## 2022-09-24 NOTE — Progress Notes (Signed)
09/24/2022  Treatrment Plan:  Diagnosis F41.1 (Generalized anxiety disorder) [n/a]  Z62.820 (Parent child relationship problem) [n/a]  Symptoms Excessive and/or unrealistic worry that is difficult to control occurring more days than not for at least 6 months about a number of events or activities. (Status: maintained) -- No Description Entered  Regularly overindulges their child's wishes and demands. (Status: maintained) -- No Description Entered  Medication Status compliance  Safety none  If Suicidal or Homicidal State Action Taken: unspecified  Current Risk: low Medications unspecified Objectives Related Problem: Achieve a level of competent, effective parenting. Description: Increase the gradual letting go of their adolescent in constructive, affirmative ways. Target Date: 2022-11-13 Frequency: Daily Modality: individual Progress: 80%  Related Problem: Achieve a level of competent, effective parenting. Description: Identify unresolved childhood issues that affect parenting and work toward their resolution. Target Date: 2022-11-13 Frequency: Daily Modality: individual Progress: 80%  Related Problem: Achieve a level of competent, effective parenting. Description: Freely express feelings of frustration, helplessness, and inadequacy that each experiences in the parenting role. Target Date: 2022-11-13 Frequency: Daily Modality: individual Progress: 80%  Related Problem: Resolve the core conflict that is the source of anxiety. Description: Learn and implement problem-solving strategies for realistically addressing worries. Target Date: 2022-11-13 Frequency: Daily Modality: individual Progress: 75%  Related Problem: Resolve the core conflict that is the source of anxiety. Description: Learn and implement calming skills to reduce overall anxiety and manage anxiety symptoms. Target Date: 2022-11-13 Frequency:  Daily Modality: individual Progress: 70%  Related Problem: Resolve the core conflict that is the source of anxiety. Description: Describe situations, thoughts, feelings, and actions associated with anxieties and worries, their impact on functioning, and attempts to resolve them. Target Date: 2022-11-13 Frequency: Daily Modality: individual Progress: 70%  Client Response full compliance  Service Location Location, [address] [place], [place] [address]  Service Code cpt [phone]  Behavioral activation plan  Facilitate problem solving  Identify automatic thoughts  Emotion regulation skills  Provide education, information  Self care activities  Relaxation training  Lifestyle change (exercise, nutrition)  Self-monitoring  Validate/empathize  Session notes: F 41.1  Goals: She has chronic anxieties, mostly related to health concerns and her son's well-being. Would like to develop strategies to manage symptoms of anxiety. Needs to understand enmeshment and learn to define and manage appropriate family boundaries. Target date is 12-23. Patient has realized significant improvement in recognizing and establishing appropriate boundaries. Anxiety about health and family matters persist and she desires to continue treatment in an effort to manage and reduce these feelings. Revised target date is 6-24.  Meds: Xanax (.25 mg);   Patient agrees to a Engineering geologist session. She is at home and I am in my home office.   Jakai: Sorousch graduated from his [employer] and [place] and her husband went to the graduation. They all had a very good time. He is preparing to move home. He has an interview with [employer], which is potentially multi-steps. There are some other possibiltes that he is seeking. Renna is somewhat less stressed as she sees her son making strong efforts to find a position. He does express that he is starting to feel some depression. We all discussed how to combat his  tendency to avoid anything not related to work or school. He will consider talking to a  fiend about visiting him in [place]. Maryama is supporting him to explore social opportunities. Her anxiety increases as she senses her son's despair. She is requesting referral for marriage counseling. Will provide next session.                                                    [name], PhD 3:15p-4:00p 45 min                                                          09/24/2022  Treatrment Plan:  Diagnosis F41.1 (Generalized anxiety disorder) [n/a]  Z62.820 (Parent child relationship problem) [n/a]  Symptoms Excessive and/or unrealistic worry that is difficult to control occurring more days than not for at least 6 months about a number of events or activities. (Status: maintained) -- No Description Entered  Regularly overindulges their child's wishes and demands. (Status: maintained) -- No Description Entered  Medication Status compliance  Safety none  If Suicidal or Homicidal State Action Taken: unspecified  Current Risk: low Medications unspecified Objectives Related Problem: Achieve a level of competent, effective parenting. Description: Increase the gradual letting go of their adolescent in constructive, affirmative ways. Target Date: 2022-11-13 Frequency: Daily Modality: individual Progress: 80%  Related Problem: Achieve a level of competent, effective parenting. Description: Identify unresolved childhood issues that affect parenting and work toward their resolution. Target Date: 2022-11-13 Frequency: Daily Modality: individual Progress: 80%  Related Problem: Achieve a level of competent, effective parenting. Description: Freely express feelings of frustration, helplessness, and inadequacy that each experiences in the parenting role. Target Date: 2022-11-13 Frequency: Daily Modality: individual Progress: 80%   Related Problem: Resolve the core conflict that is the source of anxiety. Description: Learn and implement problem-solving strategies for realistically addressing worries. Target Date: 2022-11-13 Frequency: Daily Modality: individual Progress: 75%  Related Problem: Resolve the core conflict that is the source of anxiety. Description: Learn and implement calming skills to reduce overall anxiety and manage anxiety symptoms. Target Date: 2022-11-13 Frequency: Daily Modality: individual Progress: 70%  Related Problem: Resolve the core conflict that is the source of anxiety. Description: Describe situations, thoughts, feelings, and actions associated with anxieties and worries, their impact on functioning, and attempts to resolve them. Target Date: 2022-11-13 Frequency: Daily Modality: individual Progress: 70%  Client Response full compliance  Service Location Location, [address] [place], [place] [address]  Service Code cpt [phone]  Behavioral activation plan  Facilitate problem solving  Identify automatic thoughts  Emotion regulation skills  Provide education, information  Self care activities  Relaxation training  Lifestyle change (exercise, nutrition)  Self-monitoring  Validate/empathize  Session notes: F 41.1  Goals: She has chronic anxieties, mostly related to health concerns and her son's well-being. Would like to develop strategies to manage symptoms of anxiety. Needs to understand enmeshment and learn to define and manage appropriate family boundaries. Target date is 12-23. Patient has realized significant improvement in recognizing and establishing appropriate boundaries. Anxiety about health and family matters persist and she desires to continue treatment in an effort to manage and reduce these feelings. Revised target date is 6-24.  Meds: Xanax (.25 mg);   Patient agrees to  a video session. She is at home and I am in my home office.   Chirstina and [name]:                                                    [name], PhD 3:15p-4:00p 45 min

## 2022-09-29 ENCOUNTER — Ambulatory Visit (INDEPENDENT_AMBULATORY_CARE_PROVIDER_SITE_OTHER): Payer: BC Managed Care – PPO

## 2022-09-29 DIAGNOSIS — Z7901 Long term (current) use of anticoagulants: Secondary | ICD-10-CM | POA: Diagnosis not present

## 2022-09-29 LAB — POCT INR: INR: 2.1 (ref 2.0–3.0)

## 2022-09-29 NOTE — Patient Instructions (Addendum)
Pre visit review using our clinic review tool, if applicable. No additional management support is needed unless otherwise documented below in the visit note.  Decrease dose today to take 1 tablet and then change weekly dose to take 5 mg (2 tablets) daily except take 2.5 mg (1 tablet) on Sundays and Thursdays and take 3.75 mg (1 1/2 tablets) on Tuesdays. Recheck in 3 weeks.

## 2022-09-29 NOTE — Progress Notes (Signed)
Decrease dose today to take 1 tablet and then change weekly dose to take 5 mg (2 tablets) daily except take 2.5 mg (1 tablet) on Sundays and Thursdays and take 3.75 mg (1 1/2 tablets) on Tuesdays. Recheck in 3 weeks.

## 2022-10-08 ENCOUNTER — Encounter: Payer: Self-pay | Admitting: Neurology

## 2022-10-08 ENCOUNTER — Ambulatory Visit: Payer: BC Managed Care – PPO | Admitting: Psychology

## 2022-10-08 ENCOUNTER — Ambulatory Visit: Payer: BC Managed Care – PPO | Admitting: Neurology

## 2022-10-08 VITALS — BP 108/68 | HR 64 | Ht 62.5 in | Wt 153.6 lb

## 2022-10-08 DIAGNOSIS — M4316 Spondylolisthesis, lumbar region: Secondary | ICD-10-CM | POA: Diagnosis not present

## 2022-10-08 DIAGNOSIS — M47816 Spondylosis without myelopathy or radiculopathy, lumbar region: Secondary | ICD-10-CM | POA: Diagnosis not present

## 2022-10-08 DIAGNOSIS — M0609 Rheumatoid arthritis without rheumatoid factor, multiple sites: Secondary | ICD-10-CM

## 2022-10-08 DIAGNOSIS — M5417 Radiculopathy, lumbosacral region: Secondary | ICD-10-CM | POA: Diagnosis not present

## 2022-10-08 DIAGNOSIS — R208 Other disturbances of skin sensation: Secondary | ICD-10-CM

## 2022-10-08 MED ORDER — PREGABALIN 50 MG PO CAPS: ORAL_CAPSULE | ORAL | 5 refills | Status: DC

## 2022-10-08 NOTE — Progress Notes (Signed)
[facility]  PATIENT: [name] DOB: 1960/08/12  REFERRING DOCTOR OR PCP: Berniece Andreas MD; Sheran Luz MD SOURCE: Patient, notes from orthopedics and neurosurgery, imaging reports, MRI images personally reviewed  _________________________________   HISTORICAL  CHIEF COMPLAINT:  Chief Complaint  Patient presents with   New Patient (Initial Visit)    RM 10, alone. New onset tingling numbness in legs that is progressing, no weakness.     HISTORY OF PRESENT ILLNESS:  I had the pleasure of seeing your patient, [name], at [facility] for neurologic consultation regarding her numbness.  She is a 62 year old woman with a h/o history of rheumatoid arthritis who reports left > right lower back pain that radiates down the left leg since September 2023.Marland Kitchen   She was initially placed on a steroid pack with benefit and has done PT.   She also tried dry needling but noted more tingling.    Since early march, she ha had tingling in the left foot and altered sensation in the back of the left calf.   She has had pain in the back of the left knee to back of thigh.    She has also noted spasms in the left calf.   She has had much milder symptoms on her right.  She notes the symptoms are worse when she sits a long time or takes a shower and better when she is more active.   Tylenol has also helped the pain.  She notes very mild weakness in the left leg.   She had one fall to the left but had no injury.    She has been prone to UTI's but no change in last 6 months.  She has some constipation.   Gait is doing well.   LBP increases with prolonged standing or walking.    Dr. [name] ordered an MRI of the lumbar spine.  I personally reviewed the images.  The most significant finding is 3 mm of anterolisthesis at L4-L5 associated with facet hypertrophy and disc protrusion.  There also appears to be a small synovial cyst on the left.  The degenerative changes  combined to cause moderately severe left lateral recess stenosis that could affect the left L5 nerve root.  There are milder changes to the right with only mild to moderate lateral recess stenosis.  There is mild bilateral foraminal narrowing.  Other levels show some degenerative changes but no spinal stenosis or nerve root compression.   She reports that an epidural steroid was discussed with her.  She also saw Dr. [name] of neurosurgery.  He also felt that there was an L5 radiculopathy to the left but that she would not be a good surgical candidate due to her osteoporosis.  He recommended pain management procedures such as epidural steroid.  She had an ultrasound of the left leg and was told it was normal  She noted a benefit with PT She has had numbness/tingling in the fingertips.   She does not wake up with tingling in the hands.  She has had PE and is on prophylactic warfarin..  For the rheumatoid arthritis, she is on Plaquenil and methotrexate and occasional prednisone if pain worsens.    Back pain improves with prednisone but leg numbness/tingling does not change.        IMAGING Lumbar MRi 05/17/2022 was personally reviewed and showed 3 mm of anterolisthesis at L4-L5 associated with advanced facet hypertrophy and disc protrusion.  There also appears to be a small  synovial cyst on the left.  The degenerative changes combined to cause moderately severe left lateral recess stenosis that could affect the left L5 nerve root.  There are milder changes to the right with only mild to moderate lateral recess stenosis.  There is mild bilateral foraminal narrowing.  Other levels show some degenerative changes but no spinal stenosis or nerve root compression.   REVIEW OF SYSTEMS: Constitutional: No fevers, chills, sweats, or change in appetite Eyes: No visual changes, double vision, eye pain Ear, nose and throat: No hearing loss, ear pain, nasal congestion, sore throat Cardiovascular: No chest pain,  palpitations Respiratory:  No shortness of breath at rest or with exertion.   No wheezes GastrointestinaI: No nausea, vomiting, diarrhea, abdominal pain, fecal incontinence Genitourinary:  No dysuria, urinary retention or frequency.  No nocturia. Musculoskeletal: As above.  She has rheumatoid arthritis. Integumentary: No rash, pruritus, skin lesions Neurological: as above Psychiatric: No depression at this time.  No anxiety Endocrine: No palpitations, diaphoresis, change in appetite, change in weigh or increased thirst Hematologic/Lymphatic:  No anemia, purpura, petechiae. Allergic/Immunologic: No itchy/runny eyes, nasal congestion, recent allergic reactions, rashes  ALLERGIES: Allergies  Allergen Reactions   Boniva [Ibandronate Sodium]     Heartburn   Ivp Dye [Iodinated Contrast Media] Swelling    Mouth swelling.   Other Swelling    Mouth swelling. Tongue    Risedronate Sodium Other (See Comments)    Foot and bone pain and fatigue Foot and bone pain and fatigue   Risedronate Sodium [Risedronate Sodium] Other (See Comments)    Foot and bone pain and fatigue   Macrobid [Nitrofurantoin] Rash    HOME MEDICATIONS:  Current Outpatient Medications:    acetaminophen (TYLENOL) 500 MG tablet, Take 500 mg by mouth as needed., Disp: , Rfl:    alendronate (FOSAMAX) 70 MG tablet, TAKE 1 TABLET(70 MG) BY MOUTH 1 TIME A WEEK WITH A FULL GLASS OF WATER AND ON AN EMPTY STOMACH, Disp: 12 tablet, Rfl: 0   ALPRAZolam (XANAX) 0.25 MG tablet, Take 1 tablet (0.25 mg total) by mouth 2 (two) times daily as needed for anxiety., Disp: 20 tablet, Rfl: 0   Calcium Carb-Cholecalciferol 600-800 MG-UNIT TABS, Take by mouth., Disp: , Rfl:    Camphor-Menthol-Methyl Sal (SALONPAS EX), Apply topically as needed., Disp: , Rfl:    Cholecalciferol (VITAMIN D3) 2000 UNITS TABS, Take 1 tablet by mouth daily., Disp: , Rfl:    Cranberry 1000 MG CAPS, Take by mouth. 3 times a week, Disp: , Rfl:    Cyanocobalamin  (VITAMIN B-12 PO), Take by mouth once a week., Disp: , Rfl:    cyclobenzaprine (FLEXERIL) 5 MG tablet, Take 0.5-1 tablets (2.5-5 mg total) by mouth at bedtime as needed for muscle spasms., Disp: 20 tablet, Rfl: 3   cycloSPORINE (RESTASIS) 0.05 % ophthalmic emulsion, Place 1 drop into both eyes daily., Disp: , Rfl:    Eflornithine HCl 13.9 % cream, Apply topically daily. , Disp: , Rfl:    folic acid (FOLVITE) 1 MG tablet, TAKE 2 TABLETS(2 MG) BY MOUTH DAILY, Disp: 180 tablet, Rfl: 3   hydroxychloroquine (PLAQUENIL) 200 MG tablet, TAKE 1 TABLET BY MOUTH TWICE DAILY MONDAY THROUGH FRIDAY ONLY, Disp: 120 tablet, Rfl: 0   methotrexate (RHEUMATREX) 2.5 MG tablet, Take 8 tablets (20 mg total) by mouth once a week., Disp: 96 tablet, Rfl: 0   mineral/vitamin supplement (MULTIGEN) 70 MG TABS tablet, Take 1 tablet (70 mg total) by mouth daily., Disp: 90 tablet, Rfl: 1  Omeprazole Magnesium (PRILOSEC PO), Take by mouth 4 (four) times a week., Disp: , Rfl:    pregabalin (LYRICA) 50 MG capsule, One po qAM and two po qHS, Disp: 90 capsule, Rfl: 5   SUMAtriptan (IMITREX) 50 MG tablet, TAKE ONE TABLET BY MOUTH AS NEEDED. MAY REPEAT IN 2 TO 4 HOURS IF NEEDED., Disp: 9 tablet, Rfl: 1   warfarin (COUMADIN) 2.5 MG tablet, TAKE 2 TABLETS BY MOUTH DAILY, EXCEPT TAKE  1 TABLET ON SUNDAYS AND TAKE 1 1/2 TABLETS ON THURSDAYS OR AS DIRECTED BY ANTICOAGULATION CLINIC, Disp: 180 tablet, Rfl: 1   nirmatrelvir/ritonavir (PAXLOVID) 20 x 150 MG & 10 x 100MG  TABS, Patient GFR is 92 Take nirmatrelvir (150 mg) 2 tablet(s) twice daily for 5 days and ritonavir (100 mg) one tablet twice daily for 5 days.take  Medication together. (Patient not taking: Reported on 08/27/2022), Disp: 30 tablet, Rfl: 0  PAST MEDICAL HISTORY: Past Medical History:  Diagnosis Date   Adjustment disorder with anxiety    B12 deficiency due to diet    is a vegetarian   Chest pain, unspecified    Chronic anticoagulation    Fatty liver    Per patient   GERD  (gastroesophageal reflux disease)    Headache(784.0)    migraines and head pain   Hx of varicella    As Child   Iron deficiency anemia, unspecified    Otalgia, unspecified    Other and unspecified hyperlipidemia    Palpitations    Personal history of venous thrombosis and embolism    Recurrent UTI    Rheumatoid arthritis(714.0)    deveshewar   Unspecified vitamin D deficiency    UTI (lower urinary tract infection) 11/28/2011   citrobacter       PAST SURGICAL HISTORY: Past Surgical History:  Procedure Laterality Date   ORIF ULNAR FRACTURE     TYMPANOSTOMY TUBE PLACEMENT  2009   Right    FAMILY HISTORY: Family History  Problem Relation Age of Onset   Parkinsonism Mother    Dementia Father    Stroke Father    Hyperlipidemia Father    Diabetes Father    Lymphoma Other    Colon cancer Neg Hx    Colon polyps Neg Hx    Esophageal cancer Neg Hx    Stomach cancer Neg Hx    Rectal cancer Neg Hx    Breast cancer Neg Hx     SOCIAL HISTORY: Social History   Socioeconomic History   Marital status: Married    Spouse name: Not on file   Number of children: 1   Years of education: Not on file   Highest education level: Bachelor's degree (e.g., BA, AB, BS)  Occupational History   Occupation: home maker  Tobacco Use   Smoking status: Never    Passive exposure: Past   Smokeless tobacco: Never  Vaping Use   Vaping Use: Never used  Substance and Sexual Activity   Alcohol use: No   Drug use: Never   Sexual activity: Not on file  Other Topics Concern   Not on file  Social History Narrative   Left handed   Caffeine use: Tea daily, coffee once a week   Regular Exercise-no20 yrs in [place] [employer].Married HH  of 3 G1P1   Social Determinants of Health   Financial Resource Strain: Low Risk  (08/26/2022)   Overall Financial Resource Strain (CARDIA)    Difficulty of Paying Living Expenses: Not hard at all  Food Insecurity:  No Food Insecurity (08/26/2022)   Hunger  Vital Sign    Worried About Running Out of Food in the Last Year: Never true    Ran Out of Food in the Last Year: Never true  Transportation Needs: No Transportation Needs (08/26/2022)   PRAPARE - Administrator, Civil Service (Medical): No    Lack of Transportation (Non-Medical): No  Physical Activity: Insufficiently Active (08/26/2022)   Exercise Vital Sign    Days of Exercise per Week: 2 days    Minutes of Exercise per Session: 40 min  Stress: Stress Concern Present (08/26/2022)   [employer] of Occupational Health - Occupational Stress Questionnaire    Feeling of Stress : To some extent  Social Connections: Unknown (08/26/2022)   Social Connection and Isolation Panel [NHANES]    Frequency of Communication with Friends and Family: Twice a week    Frequency of Social Gatherings with Friends and Family: Patient declined    Attends Religious Services: Patient declined    Database administrator or Organizations: No    Attends Engineer, structural: Not on file    Marital Status: Married  Catering manager Violence: Not on file       PHYSICAL EXAM  Vitals:   10/08/22 1440  BP: 108/68  Pulse: 64  Weight: 153 lb 9.6 oz (69.7 kg)  Height: 5' 2.5" (1.588 m)    Body mass index is 27.65 kg/m.   General: The patient is well-developed and well-nourished and in no acute distress  HEENT:  Head is [place]/AT.  Sclera are anicteric.  Funduscopic exam shows normal optic discs and retinal vessels.  Neck: No carotid bruits are noted.  The neck is nontender.  Cardiovascular: The heart has a regular rate and rhythm with a normal S1 and S2. There were no murmurs, gallops or rubs.    Skin: Extremities are without rash or  edema.  Musculoskeletal:  Back is nontender  Neurologic Exam  Mental status: The patient is alert and oriented x 3 at the time of the examination. The patient has apparent normal recent and remote memory, with an apparently normal attention span and  concentration ability.   Speech is normal.  Cranial nerves: Extraocular movements are full. Pupils are equal, round, and reactive to light and accomodation.  Visual fields are full.  Facial symmetry is present. There is good facial sensation to soft touch bilaterally.Facial strength is normal.  Trapezius and sternocleidomastoid strength is normal. No dysarthria is noted.  The tongue is midline, and the patient has symmetric elevation of the soft palate. No obvious hearing deficits are noted.  Motor:  Muscle bulk is normal.   Tone is normal. Strength is  5 / 5 in all 4 extremities.   Sensory: Sensory testing is intact to pinprick, soft touch and vibration sensation in all 4 extremities.  Coordination: Cerebellar testing reveals good finger-nose-finger and heel-to-shin bilaterally.  Gait and station: Station is normal.   Gait is normal. Tandem gait is normal. Romberg is negative.   Reflexes: Deep tendon reflexes are symmetric and normal bilaterally.   Plantar responses are flexor.    DIAGNOSTIC DATA (LABS, IMAGING, TESTING) - I reviewed patient records, labs, notes, testing and imaging myself where available.  Lab Results  Component Value Date   WBC 10.2 08/25/2022   HGB 14.6 08/25/2022   HCT 44.9 08/25/2022   MCV 85.3 08/25/2022   PLT 220.0 08/25/2022      Component Value Date/Time   NA 139  08/25/2022 0919   K 4.1 08/25/2022 0919   CL 103 08/25/2022 0919   CO2 28 08/25/2022 0919   GLUCOSE 84 08/25/2022 0919   BUN 11 08/25/2022 0919   CREATININE 0.80 08/25/2022 0919   CREATININE 0.73 06/10/2022 1423   CALCIUM 9.4 08/25/2022 0919   PROT 6.7 08/25/2022 0919   PROT 7.2 08/25/2022 0919   ALBUMIN 4.2 08/25/2022 0919   AST 20 08/25/2022 0919   ALT 15 08/25/2022 0919   ALKPHOS 71 08/25/2022 0919   BILITOT 0.7 08/25/2022 0919   GFRNONAA 83 07/26/2020 1056   GFRAA 96 07/26/2020 1056   Lab Results  Component Value Date   CHOL 209 (H) 08/25/2022   HDL 80.70 08/25/2022   LDLCALC  89 08/25/2022   LDLDIRECT 97.3 05/25/2013   TRIG 193.0 (H) 08/25/2022   CHOLHDL 3 08/25/2022   Lab Results  Component Value Date   HGBA1C 6.1 08/25/2022   Lab Results  Component Value Date   VITAMINB12 476 08/25/2022   Lab Results  Component Value Date   TSH 2.84 08/25/2022       ASSESSMENT AND PLAN  Lumbosacral radiculopathy at L5  Facet hypertrophy of lumbar region  Anterolisthesis of lumbar spine  Dysesthesia  Rheumatoid arthritis of multiple sites with negative rheumatoid factor (HCC)   In summary, Ms. Goulette is a 62 year old woman with a history of rheumatoid arthritis he also has significant degenerative changes at L4-L5 leading to possible left L5 nerve root compression due to combination of facet hypertrophy, small synovial cyst, anterolisthesis and disc protrusion.  She also reports some numbness at times in the hands.  To help with the L5 radiculopathy, I will start pregabalin and titrate up to 50 mg in the morning and 100 mg at night.  This dose can be further increased if she tolerates it well. If she does not note significant benefit after 6 weeks of taking pregabalin, then I would recommend that she have an epidural steroid injection performed. The symptoms in the hands are mild.  If they worsen we could check an MRI of the cervical spine to determine if there is significant degenerative change or inflammatory change from rheumatoid arthritis She will return to see me as needed based on the response to the medications and be referred for procedures if necessary.  Thank you for asking me to see Ms. Flanary.  Please let me know if I will be of further assistance with her or other patients in the future.  Allanah Mcfarland A. [name] Foot, MD, [facility] 10/08/2022, 4:12 PM Certified in Neurology, Clinical Neurophysiology, Sleep Medicine and Neuroimaging  [facility] [address], [address] [place], [place] [address] [phone]

## 2022-10-09 ENCOUNTER — Telehealth: Payer: Self-pay

## 2022-10-09 NOTE — Telephone Encounter (Signed)
Pt reports she had a neurology apt yesterday and was prescribed Lyrica. Was told this does not interact with warfarin but wanted coumadin clinic to know of medication change.  Advised Lyrica does not interact with warfarin. If any other changes to contact coumadin clinic. Pt verbalized understanding.

## 2022-10-16 ENCOUNTER — Other Ambulatory Visit: Payer: Self-pay | Admitting: Rheumatology

## 2022-10-22 ENCOUNTER — Ambulatory Visit (INDEPENDENT_AMBULATORY_CARE_PROVIDER_SITE_OTHER): Payer: BC Managed Care – PPO

## 2022-10-22 ENCOUNTER — Ambulatory Visit: Payer: BC Managed Care – PPO | Admitting: Psychology

## 2022-10-22 DIAGNOSIS — F4322 Adjustment disorder with anxiety: Secondary | ICD-10-CM | POA: Diagnosis not present

## 2022-10-22 DIAGNOSIS — Z7901 Long term (current) use of anticoagulants: Secondary | ICD-10-CM

## 2022-10-22 LAB — POCT INR: INR: 1.8 — AB (ref 2.0–3.0)

## 2022-10-22 NOTE — Progress Notes (Signed)
10/22/2022  Treatrment Plan:  Diagnosis F41.1 (Generalized anxiety disorder) [n/a]  Z62.820 (Parent child relationship problem) [n/a]  Symptoms Excessive and/or unrealistic worry that is difficult to control occurring more days than not for at least 6 months about a number of events or activities. (Status: maintained) -- No Description Entered  Regularly overindulges their child's wishes and demands. (Status: maintained) -- No Description Entered  Medication Status compliance  Safety none  If Suicidal or Homicidal State Action Taken: unspecified  Current Risk: low Medications unspecified Objectives Related Problem: Achieve a level of competent, effective parenting. Description: Increase the gradual letting go of their adolescent in constructive, affirmative ways. Target Date: 2022-11-13 Frequency: Daily Modality: individual Progress: 80%  Related Problem: Achieve a level of competent, effective parenting. Description: Identify unresolved childhood issues that affect parenting and work toward their resolution. Target Date: 2022-11-13 Frequency: Daily Modality: individual Progress: 80%  Related Problem: Achieve a level of competent, effective parenting. Description: Freely express feelings of frustration, helplessness, and inadequacy that each experiences in the parenting role. Target Date: 2022-11-13 Frequency: Daily Modality: individual Progress: 80%  Related Problem: Resolve the core conflict that is the source of anxiety. Description: Learn and implement problem-solving strategies for realistically addressing worries. Target Date: 2022-11-13 Frequency: Daily Modality: individual Progress: 75%  Related Problem: Resolve the core conflict that is the source of anxiety. Description: Learn and implement calming skills to reduce overall anxiety and manage anxiety symptoms. Target Date:  2022-11-13 Frequency: Daily Modality: individual Progress: 70%  Related Problem: Resolve the core conflict that is the source of anxiety. Description: Describe situations, thoughts, feelings, and actions associated with anxieties and worries, their impact on functioning, and attempts to resolve them. Target Date: 2022-11-13 Frequency: Daily Modality: individual Progress: 70%  Client Response full compliance  Service Location Location, [address] [place], [place] [address]  Service Code cpt [phone]  Behavioral activation plan  Facilitate problem solving  Identify automatic thoughts  Emotion regulation skills  Provide education, information  Self care activities  Relaxation training  Lifestyle change (exercise, nutrition)  Self-monitoring  Validate/empathize  Session notes: F 41.1  Goals: She has chronic anxieties, mostly related to health concerns and her son's well-being. Would like to develop strategies to manage symptoms of anxiety. Needs to understand enmeshment and learn to define and manage appropriate family boundaries. Target date is 12-23. Patient has realized significant improvement in recognizing and establishing appropriate boundaries. Anxiety about health and family matters persist and she desires to continue treatment in an effort to manage and reduce these feelings. Revised target date is 6-24.  Meds: Xanax (.25 mg);   Patient agrees to a Engineering geologist session. She is at home and I am in my home office.   Korrine: Sorousch graduated from his [employer] and [employer] and her husband went to the graduation. They all had a very good time. He is preparing to move home. He has an interview with [employer], which is potentially multi-steps. There are some other possibiltes that he is seeking. Amandah is somewhat less stressed as she sees her son making strong efforts to find a position. He does express that he is starting to feel some depression. We all discussed how  to combat his tendency  to avoid anything not related to work or school. He will consider talking to a fiend about visiting him in [place]. Aminat is supporting him to explore social opportunities. Her anxiety increases as she senses her son's despair. She is requesting referral for marriage counseling. Will provide next session.                                                    [name], PhD 3:15p-4:00p 45 min                                                          10/22/2022  Treatrment Plan:  Diagnosis F41.1 (Generalized anxiety disorder) [n/a]  Z62.820 (Parent child relationship problem) [n/a]  Symptoms Excessive and/or unrealistic worry that is difficult to control occurring more days than not for at least 6 months about a number of events or activities. (Status: maintained) -- No Description Entered  Regularly overindulges their child's wishes and demands. (Status: maintained) -- No Description Entered  Medication Status compliance  Safety none  If Suicidal or Homicidal State Action Taken: unspecified  Current Risk: low Medications unspecified Objectives Related Problem: Achieve a level of competent, effective parenting. Description: Increase the gradual letting go of their adolescent in constructive, affirmative ways. Target Date: 2023-05-15 Frequency: Daily Modality: individual Progress: 80%  Related Problem: Achieve a level of competent, effective parenting. Description: Identify unresolved childhood issues that affect parenting and work toward their resolution. Target Date: 2023-05-15 Frequency: Daily Modality: individual Progress: 80%  Related Problem: Achieve a level of competent, effective parenting. Description: Freely express feelings of frustration, helplessness, and inadequacy that each experiences in the parenting role. Target Date: 2023-05-15 Frequency: Daily Modality:  individual Progress: 80%  Related Problem: Resolve the core conflict that is the source of anxiety. Description: Learn and implement problem-solving strategies for realistically addressing worries. Target Date: 2023-05-15 Frequency: Daily Modality: individual Progress: 75%  Related Problem: Resolve the core conflict that is the source of anxiety. Description: Learn and implement calming skills to reduce overall anxiety and manage anxiety symptoms. Target Date: 2023-05-15 Frequency: Daily Modality: individual Progress: 70%  Related Problem: Resolve the core conflict that is the source of anxiety. Description: Describe situations, thoughts, feelings, and actions associated with anxieties and worries, their impact on functioning, and attempts to resolve them. Target Date: 2023-05-15 Frequency: Daily Modality: individual Progress: 70%  Client Response full compliance  Service Location Location, [address] [place], [place] [address]  Service Code cpt [phone]  Behavioral activation plan  Facilitate problem solving  Identify automatic thoughts  Emotion regulation skills  Provide education, information  Self care activities  Relaxation training  Lifestyle change (exercise, nutrition)  Self-monitoring  Validate/empathize  Session notes: F 41.1  Goals: She has chronic anxieties, mostly related to health concerns and her son's well-being. Would like to develop strategies to manage symptoms of anxiety. Needs to understand enmeshment and learn to define and manage appropriate family boundaries. Target date is 12-23. Patient has realized significant improvement in recognizing and establishing appropriate boundaries. Anxiety about health and family matters persist and she desires to continue treatment in an effort to manage and reduce these feelings.  Revised target date is 12-24.  Meds: Xanax (.25 mg);   Patient agrees to a video session. She is at home and I am in my home office.    Breeanna and [name]: [name] is back to [employer] post graduation. He says that he is having trouble getting up in the morning and is not sure if it is depression. He does say he does not have any anxiety as he did before. Demyiah says that she is less concerned because [name] is at home and she knows he is taking better care of himself. We talked about him using this time to get himself back into good physical and mental shape. We discussed boundaries. He feels that it is his job to protect his mom because she will not take care of herself. And he feels he needs to protect his dad from being taken advantage of by the family in [place]. Inconsistent in that she states that he does not help with day to day tasks. We all talked about the fact that [name] "never grew up" and it is, in part, due to being enabled his whole life. We will continue discussion of appropriate family boundaries at next session.                                                       [name], PhD 3:15p-4:00p 45 min

## 2022-10-22 NOTE — Progress Notes (Signed)
Pt started Lyrica.  Continue 5 mg (2 tablets) daily except take 2.5 mg (1 tablet) on Sundays and Thursdays and take 3.75 mg (1 1/2 tablets) on Tuesdays. Recheck in 5 weeks.

## 2022-10-22 NOTE — Patient Instructions (Addendum)
Pre visit review using our clinic review tool, if applicable. No additional management support is needed unless otherwise documented below in the visit note.  Continue 5 mg (2 tablets) daily except take 2.5 mg (1 tablet) on Sundays and Thursdays and take 3.75 mg (1 1/2 tablets) on Tuesdays. Recheck in 5 weeks.

## 2022-10-31 ENCOUNTER — Other Ambulatory Visit: Payer: Self-pay | Admitting: Obstetrics and Gynecology

## 2022-10-31 ENCOUNTER — Other Ambulatory Visit (HOSPITAL_COMMUNITY)
Admission: RE | Admit: 2022-10-31 | Discharge: 2022-10-31 | Disposition: A | Discharge status: Home / Self Care | Payer: BC Managed Care – PPO | Source: Ambulatory Visit | Attending: Obstetrics and Gynecology | Admitting: Obstetrics and Gynecology

## 2022-10-31 DIAGNOSIS — Z124 Encounter for screening for malignant neoplasm of cervix: Secondary | ICD-10-CM | POA: Diagnosis not present

## 2022-11-03 ENCOUNTER — Institutional Professional Consult (permissible substitution): Payer: BC Managed Care – PPO | Admitting: Neurology

## 2022-11-04 LAB — CYTOLOGY - PAP
Comment: NEGATIVE
Diagnosis: NEGATIVE
High risk HPV: NEGATIVE

## 2022-11-05 ENCOUNTER — Ambulatory Visit: Payer: BC Managed Care – PPO | Admitting: Psychology

## 2022-11-05 DIAGNOSIS — F4322 Adjustment disorder with anxiety: Secondary | ICD-10-CM | POA: Diagnosis not present

## 2022-11-05 NOTE — Progress Notes (Signed)
11/05/2022  Treatrment Plan:  Diagnosis F41.1 (Generalized anxiety disorder) [n/a]  Z62.820 (Parent child relationship problem) [n/a]  Symptoms Excessive and/or unrealistic worry that is difficult to control occurring more days than not for at least 6 months about a number of events or activities. (Status: maintained) -- No Description Entered  Regularly overindulges their child's wishes and demands. (Status: maintained) -- No Description Entered  Medication Status compliance  Safety none  If Suicidal or Homicidal State Action Taken: unspecified  Current Risk: low Medications unspecified Objectives Related Problem: Achieve a level of competent, effective parenting. Description: Increase the gradual letting go of their adolescent in constructive, affirmative ways. Target Date: 2023-05-15 Frequency: Daily Modality: individual Progress: 80%  Related Problem: Achieve a level of competent, effective parenting. Description: Identify unresolved childhood issues that affect parenting and work toward their resolution. Target Date: 2023-05-15 Frequency: Daily Modality: individual Progress: 80%  Related Problem: Achieve a level of competent, effective parenting. Description: Freely express feelings of frustration, helplessness, and inadequacy that each experiences in the parenting role. Target Date: 2023-05-15 Frequency: Daily Modality: individual Progress: 80%  Related Problem: Resolve the core conflict that is the source of anxiety. Description: Learn and implement problem-solving strategies for realistically addressing worries. Target Date: 2023-05-15 Frequency: Daily Modality: individual Progress: 75%  Related Problem: Resolve the core conflict that is the source of anxiety. Description: Learn and implement calming skills to reduce overall anxiety and manage anxiety symptoms. Target Date: 2023-05-15 Frequency: Daily Modality: individual Progress: 70%  Related Problem:  Resolve the core conflict that is the source of anxiety. Description: Describe situations, thoughts, feelings, and actions associated with anxieties and worries, their impact on functioning, and attempts to resolve them. Target Date: 2023-05-15 Frequency: Daily Modality: individual Progress: 70%  Client Response full compliance  Service Location Location, [address] [place], [place] [address]  Service Code cpt [phone]  Behavioral activation plan  Facilitate problem solving  Identify automatic thoughts  Emotion regulation skills  Provide education, information  Self care activities  Relaxation training  Lifestyle change (exercise, nutrition)  Self-monitoring  Validate/empathize  Session notes: F 41.1  Goals: She has chronic anxieties, mostly related to health concerns and her son's well-being. Would like to develop strategies to manage symptoms of anxiety. Needs to understand enmeshment and learn to define and manage appropriate family boundaries. Target date is 12-23. Patient has realized significant improvement in recognizing and establishing appropriate boundaries. Anxiety about health and family matters persist and she desires to continue treatment in an effort to manage and reduce these feelings. Revised target date is 12-24.  Meds: Xanax (.25 mg);   Patient agrees to a video session. She is at home and I am in my home office.   Matti and [name]: Soroushhas a couple of job interviews coming up. One is with [employer] and the othe with [employer]. We discussed his chronic over-preparation. This robs him of the ability to build self-confidence that he can think clearly without a written out script. He realizes that his way of working is not sustainable. Yula recognizes her son's lack of confidence and worries about his ability to develop successful strategies. She is frustrated with her son's apparent inability to be spontaneous.  [name], PhD 3:15p-4:00p 45 min

## 2022-11-10 ENCOUNTER — Ambulatory Visit
Admission: RE | Admit: 2022-11-10 | Discharge: 2022-11-10 | Disposition: A | Discharge status: Home / Self Care | Payer: BC Managed Care – PPO | Source: Ambulatory Visit | Attending: Obstetrics and Gynecology | Admitting: Obstetrics and Gynecology

## 2022-11-10 DIAGNOSIS — Z1231 Encounter for screening mammogram for malignant neoplasm of breast: Secondary | ICD-10-CM

## 2022-11-17 ENCOUNTER — Ambulatory Visit: Payer: BC Managed Care – PPO | Admitting: Psychology

## 2022-11-17 DIAGNOSIS — F411 Generalized anxiety disorder: Secondary | ICD-10-CM

## 2022-11-17 DIAGNOSIS — Z62820 Parent-biological child conflict: Secondary | ICD-10-CM

## 2022-11-17 DIAGNOSIS — F4322 Adjustment disorder with anxiety: Secondary | ICD-10-CM

## 2022-11-17 NOTE — Progress Notes (Signed)
11/17/2022  Treatrment Plan:  Diagnosis F41.1 (Generalized anxiety disorder) [n/a]  Z62.820 (Parent child relationship problem) [n/a]  Symptoms Excessive and/or unrealistic worry that is difficult to control occurring more days than not for at least 6 months about a number of events or activities. (Status: maintained) -- No Description Entered  Regularly overindulges their child's wishes and demands. (Status: maintained) -- No Description Entered  Medication Status compliance  Safety none  If Suicidal or Homicidal State Action Taken: unspecified  Current Risk: low Medications unspecified Objectives Related Problem: Achieve a level of competent, effective parenting. Description: Increase the gradual letting go of their adolescent in constructive, affirmative ways. Target Date: 2023-05-15 Frequency: Daily Modality: individual Progress: 80%  Related Problem: Achieve a level of competent, effective parenting. Description: Identify unresolved childhood issues that affect parenting and work toward their resolution. Target Date: 2023-05-15 Frequency: Daily Modality: individual Progress: 80%  Related Problem: Achieve a level of competent, effective parenting. Description: Freely express feelings of frustration, helplessness, and inadequacy that each experiences in the parenting role. Target Date: 2023-05-15 Frequency: Daily Modality: individual Progress: 80%  Related Problem: Resolve the core conflict that is the source of anxiety. Description: Learn and implement problem-solving strategies for realistically addressing worries. Target Date: 2023-05-15 Frequency: Daily Modality: individual Progress: 75%  Related Problem: Resolve the core conflict that is the source of anxiety. Description: Learn and implement calming skills to reduce overall anxiety and manage anxiety symptoms. Target Date: 2023-05-15 Frequency: Daily Modality:  individual Progress: 70%  Related Problem: Resolve the core conflict that is the source of anxiety. Description: Describe situations, thoughts, feelings, and actions associated with anxieties and worries, their impact on functioning, and attempts to resolve them. Target Date: 2023-05-15 Frequency: Daily Modality: individual Progress: 70%  Client Response full compliance  Service Location Location, [address] [place], [place] [address]  Service Code cpt [phone]  Behavioral activation plan  Facilitate problem solving  Identify automatic thoughts  Emotion regulation skills  Provide education, information  Self care activities  Relaxation training  Lifestyle change (exercise, nutrition)  Self-monitoring  Validate/empathize  Session notes: F 41.1  Goals: She has chronic anxieties, mostly related to health concerns and her son's well-being. Would like to develop strategies to manage symptoms of anxiety. Needs to understand enmeshment and learn to define and manage appropriate family boundaries. Target date is 12-23. Patient has realized significant improvement in recognizing and establishing appropriate boundaries. Anxiety about health and family matters persist and she desires to continue treatment in an effort to manage and reduce these feelings. Revised target date is 12-24.  Meds: Xanax (.25 mg);   Patient agrees to a video session. She is at home and I am in my home office.   Shalva and [name]: [name] got a job offer from [employer] and is excited by the opportunity. He interviewed with [employer], but has not yet heard back. This will entail moving back to [facility]. We talked about Cintia's fears/anxieties associated with her son's decision. [name] already is having anxiety that he is not qualified for the job. Says that he is again, most concerned about the lack of speed in his work. We talked about reasonable expectations for both of them. He has to work toward  much better balance right at the start of his job and [place] needs to not impose her own anxieties onto [employer]. Suggesting to him that he identify  a therapist to work with in [place]. He is almost completely focused on his new job responsibilities and his role in the company rather than any personal/social growth. Feels it "all starts with his anxiety about driving". Says he cannot see past the limitations of his leife due to his fear of driving. He agrees to get a Veterinary surgeon once he moves. This is a relief to [facility] and helps her feel less like it is her responsibility.    [name] has increased his Lexapro to 20mg . Cannot yet tell the impact of the increased dosage. He does feel he is sleeping a little more. Is hoping that the meds will help his anxiety when he starts work.                                                             [name], PhD 10:35a-11:30a 55 min

## 2022-11-19 ENCOUNTER — Ambulatory Visit: Payer: BC Managed Care – PPO | Admitting: Psychology

## 2022-11-21 ENCOUNTER — Encounter: Payer: Self-pay | Admitting: Neurology

## 2022-11-24 ENCOUNTER — Ambulatory Visit (INDEPENDENT_AMBULATORY_CARE_PROVIDER_SITE_OTHER): Payer: BC Managed Care – PPO

## 2022-11-24 DIAGNOSIS — Z7901 Long term (current) use of anticoagulants: Secondary | ICD-10-CM | POA: Diagnosis not present

## 2022-11-24 LAB — POCT INR: INR: 1.6 — AB (ref 2.0–3.0)

## 2022-11-24 NOTE — Patient Instructions (Addendum)
Pre visit review using our clinic review tool, if applicable. No additional management support is needed unless otherwise documented below in the visit note.  Continue 5 mg (2 tablets) daily except take 2.5 mg (1 tablet) on Sundays and Thursdays and take 3.75 mg (1 1/2 tablets) on Tuesdays. Recheck in 5 weeks.  

## 2022-11-24 NOTE — Progress Notes (Signed)
Continue 5 mg (2 tablets) daily except take 2.5 mg (1 tablet) on Sundays and Thursdays and take 3.75 mg (1 1/2 tablets) on Tuesdays. Recheck in 5 weeks.

## 2022-11-26 NOTE — Progress Notes (Signed)
Office Visit Note  Patient: [name]             Date of Birth: April 04, 1961           MRN: [id number]             PCP: Krystal Headings, MD Referring: Krystal Headings, MD Visit Date: 12/09/2022 Occupation: @GUAROCC @  Subjective:  Pain in both shoulders  History of Present Illness: [name] is a 62 y.o. female with seronegative rheumatoid arthritis, osteoarthritis and degenerative disc disease.   During the first week of May at her son's graduation she fell backwards and landed on her left shoulder and left back.  The back symptoms improved.  She was evaluated by Dr. Yetta [name].  She was also evaluated by Dr. Fabian [name] for left shoulder pain.  Some bruising was noted.  She states that her bruising resolved but she continues to have pain and discomfort in her bilateral shoulders for the last few months.  She has nocturnal pain.  She has difficulty raising her arms.  Left shoulder pain is more prominent than the right shoulder pain.  She notices intermittent joint stiffness and swelling.  She continues to take methotrexate and Plaquenil on a regular basis. Patient was evaluated by Dr. [name] Foot on Oct 08, 2022 for ongoing lower back pain.  According to his office visit note she was having symptoms of L5 radiculopathy.  He placed her on pregabalin 50 mg p.o. twice daily.Which is helping.  He also discussed possible epidural injection in the past with the symptoms did not resolve.  He suggested repeating MRI of the cervical spine if she has persistent symptoms of the shoulder pain.  Activities of Daily Living:  Patient reports morning stiffness for 30-45 minutes.   Patient Reports nocturnal pain.  Difficulty dressing/grooming: Denies Difficulty climbing stairs: Denies Difficulty getting out of chair: Denies Difficulty using hands for taps, buttons, cutlery, and/or writing: Denies  Review of Systems  Constitutional:  Positive for fatigue.  HENT:  Positive for mouth dryness. Negative for  mouth sores.   Eyes:  Positive for dryness.  Respiratory:  Negative for shortness of breath.   Cardiovascular:  Negative for chest pain and palpitations.  Gastrointestinal:  Positive for constipation. Negative for blood in stool and diarrhea.  Endocrine: Negative for increased urination.  Genitourinary:  Negative for involuntary urination.  Musculoskeletal:  Positive for joint pain, joint pain and morning stiffness. Negative for gait problem, joint swelling, myalgias, muscle weakness, muscle tenderness and myalgias.  Skin:  Positive for sensitivity to sunlight. Negative for color change, rash and hair loss.  Allergic/Immunologic: Negative for susceptible to infections.  Neurological:  Negative for dizziness and headaches.  Hematological:  Negative for swollen glands.  Psychiatric/Behavioral:  Positive for sleep disturbance. Negative for depressed mood. The patient is nervous/anxious.     PMFS History:  Patient Active Problem List   Diagnosis Date Noted   Lumbosacral radiculopathy at L5 10/08/2022   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HCC) 01/06/2018   Long term (current) use of anticoagulants 02/11/2017   Sicca syndrome (HCC) 12/22/2016   ANA positive 12/22/2016   Raised intraocular pressure of both eyes 12/22/2016   Migraine 03/31/2016   Anxiety 03/31/2016   High risk medication use 03/31/2016   Primary osteoarthritis of both feet 03/31/2016   Primary osteoarthritis of both knees 03/31/2016   Stiffness of left hand joint 11/27/2014   Elevated IOP 06/09/2014   Long term current use of systemic steroids 06/09/2014   Medically  complex patient 06/09/2014   Current use of steroid medication 01/09/2014   Xerosis of skin 12/07/2013   Encounter for therapeutic drug monitoring 06/09/2013   B12 deficiency 05/31/2013   Iron deficiency 05/31/2013   Visit for preventive health examination 05/25/2012   Mammogram abnormal 04/12/2012   Iliac crest  pain 01/23/2012   Vaginal atrophy  01/23/2012   History of recurrent UTI (urinary tract infection) 01/23/2012   Osteoporosis 04/29/2011   Nonspecific abnormal results of liver function study 12/08/2010   Chronic anticoagulation    Anticoagulant long-term use 07/15/2010   Anticardiolipin antibody positive 07/15/2010   Chronic cough 03/19/2009   CHEST PAIN-UNSPECIFIED 08/26/2008   PALPITATIONS, RECURRENT 07/24/2008   Vitamin D deficiency 05/30/2008   HYPERLIPIDEMIA 05/30/2008   IRON DEFICIENCY 05/30/2008   Adjustment disorder with anxiety 05/30/2008   Rheumatoid arthritis (HCC) 05/30/2008   PULMONARY EMBOLISM, HX OF 05/30/2008    Past Medical History:  Diagnosis Date   Adjustment disorder with anxiety    B12 deficiency due to diet    is a vegetarian   Chest pain, unspecified    Chronic anticoagulation    Fatty liver    Per patient   GERD (gastroesophageal reflux disease)    Headache(784.0)    migraines and head pain   Hx of varicella    As Child   Iron deficiency anemia, unspecified    Otalgia, unspecified    Other and unspecified hyperlipidemia    Palpitations    Personal history of venous thrombosis and embolism    Recurrent UTI    Rheumatoid arthritis(714.0)    deveshewar   Unspecified vitamin D deficiency    UTI (lower urinary tract infection) 11/28/2011   citrobacter       Family History  Problem Relation Age of Onset   Parkinsonism Mother    Dementia Father    Stroke Father    Hyperlipidemia Father    Diabetes Father    Lymphoma Other    Colon cancer Neg Hx    Colon polyps Neg Hx    Esophageal cancer Neg Hx    Stomach cancer Neg Hx    Rectal cancer Neg Hx    Breast cancer Neg Hx    Past Surgical History:  Procedure Laterality Date   ORIF ULNAR FRACTURE     TYMPANOSTOMY TUBE PLACEMENT  2009   Right   Social History   Social History Narrative   Left handed   Caffeine use: Tea daily, coffee once a week   Regular Exercise-no20 yrs in [place] at [employer].Married HH  of 3 G1P1    Immunization History  Administered Date(s) Administered   Influenza,inj,Quad PF,6+ Mos 04/19/2013, 06/27/2015, 03/05/2016, 02/11/2017, 03/15/2018, 02/23/2019, 04/02/2020, 03/13/2021, 02/26/2022   PFIZER(Purple Top)SARS-COV-2 Vaccination 08/06/2019, 08/25/2019, 01/12/2020, 11/10/2020   PPD Test 07/21/2016   Tdap 05/31/2013   Zoster Recombinant(Shingrix) 01/23/2021, 06/11/2021     Objective: Vital Signs: BP 101/67 (BP Location: Right Arm, Patient Position: Sitting, Cuff Size: Normal)   Pulse (!) 55   Resp 14   Ht 5' 2.5" (1.588 m)   Wt 157 lb (71.2 kg)   BMI 28.26 kg/m    Physical Exam Vitals and nursing note reviewed.  Constitutional:      Appearance: She is well-developed.  HENT:     Head: Normocephalic and atraumatic.  Eyes:     Conjunctiva/sclera: Conjunctivae normal.  Cardiovascular:     Rate and Rhythm: Normal rate and regular rhythm.     Heart sounds: Normal heart sounds.  Pulmonary:     Effort: Pulmonary effort is normal.     Breath sounds: Normal breath sounds.  Abdominal:     General: Bowel sounds are normal.     Palpations: Abdomen is soft.  Musculoskeletal:     Cervical back: Normal range of motion.  Lymphadenopathy:     Cervical: No cervical adenopathy.  Skin:    General: Skin is warm and dry.     Capillary Refill: Capillary refill takes less than 2 seconds.  Neurological:     Mental Status: She is alert and oriented to person, place, and time.  Psychiatric:        Behavior: Behavior normal.      Musculoskeletal Exam: She had good range of motion of the cervical spine.  Thoracic kyphosis was noted.  She had discomfort range of motion of her lumbar spine.  Shoulder joints were in good range of motion with some discomfort with abduction and internal rotation.  She has some tenderness over the subacromial region on the left side.  Elbow joints and wrist joints in good range of motion.  She had mild tenderness over right wrist joint.  There was no MCP PIP  or DIP swelling or synovitis.  Hip joints and knee joints were in good range of motion without any warmth swelling or effusion.  There was no tenderness over ankles or MTPs.  CDAI Exam: CDAI Score: -- Patient Global: 30 / 100; Provider Global: 10 / 100 Swollen: --; Tender: -- Joint Exam 12/09/2022   No joint exam has been documented for this visit   There is currently no information documented on the homunculus. Go to the Rheumatology activity and complete the homunculus joint exam.  Investigation: No additional findings.  Imaging: MM 3D SCREENING MAMMOGRAM BILATERAL BREAST  Result Date: 11/12/2022 CLINICAL DATA:  Screening. EXAM: DIGITAL SCREENING BILATERAL MAMMOGRAM WITH TOMOSYNTHESIS AND CAD TECHNIQUE: Bilateral screening digital craniocaudal and mediolateral oblique mammograms were obtained. Bilateral screening digital breast tomosynthesis was performed. The images were evaluated with computer-aided detection. COMPARISON:  Previous exam(s). ACR Breast Density Category b: There are scattered areas of fibroglandular density. FINDINGS: There are no findings suspicious for malignancy. IMPRESSION: No mammographic evidence of malignancy. A result letter of this screening mammogram will be mailed directly to the patient. RECOMMENDATION: Screening mammogram in one year. (Code:SM-B-01Y) BI-RADS CATEGORY  1: Negative. Electronically Signed   By: Emmaline Kluver M.D.   On: 11/12/2022 13:00    Recent Labs: Lab Results  Component Value Date   WBC 10.2 08/25/2022   HGB 14.6 08/25/2022   PLT 220.0 08/25/2022   NA 139 08/25/2022   K 4.1 08/25/2022   CL 103 08/25/2022   CO2 28 08/25/2022   GLUCOSE 84 08/25/2022   BUN 11 08/25/2022   CREATININE 0.80 08/25/2022   BILITOT 0.7 08/25/2022   ALKPHOS 71 08/25/2022   AST 20 08/25/2022   ALT 15 08/25/2022   PROT 6.7 08/25/2022   PROT 7.2 08/25/2022   ALBUMIN 4.2 08/25/2022   CALCIUM 9.4 08/25/2022   GFRAA 96 07/26/2020   QFTBGOLDPLUS NEGATIVE  09/12/2020    Speciality Comments: PLQ Eye Exam: 02/19/2022 WNL @ [facility] Follow up 1 year Ttd with Fosamax for 5 years in the past Fosamax restarted 05/2020  Procedures:  No procedures performed Allergies: Boniva [ibandronate sodium], Ivp dye [iodinated contrast media], Other, Risedronate sodium, Risedronate sodium [risedronate sodium], and Macrobid [nitrofurantoin]   Assessment / Plan:     Visit Diagnoses: Rheumatoid arthritis of multiple sites  with negative rheumatoid factor (HCC) - RF negative, anti-CCP positive, ANA positive.  Patient continues to have some stiffness and discomfort.  No synovitis was noted.  She had mild tenderness over right wrist joint.  She continues to have pain and discomfort in her bilateral shoulders.  High risk medication use - Plaquenil 200 mg by mouth twice daily Monday to Friday, Methotrexate 8 tablets by mouth every week, and folic acid 2 mg by mouth daily. -August 25, 2022 CBC and CMP were normal.  Will check labs today and then every 3 months.  Information on immunization was placed in the AVS.  She was advised to hold methotrexate if she develops an infection resume after the infection resolves.  Plan: CBC with Differential/Platelet, COMPLETE METABOLIC PANEL WITH GFR  Sicca syndrome (HCC)-she continues to have sicca symptoms.  Over-the-counter products were discussed.  Chronic pain of both shoulders -she has been experiencing pain and discomfort in her bilateral shoulders.  She had good range of motion with some discomfort more prominent on the left side.  Plan: XR Shoulder Left, XR Shoulder Right.  No glenohumeral joint space narrowing was noted.  Acromioclavicular narrowing was noted.  X-ray findings were reviewed with the patient.  I will refer her to physical therapy.  I also discussed possible cortisone injection in the left shoulder if her symptoms do not improve.  Primary osteoarthritis of both knees -no warmth swelling or effusion was noted.   X-rays on Sep 26, 2021 showed bilateral moderate osteoarthritis and moderate chondromalacia patella.  Primary osteoarthritis of both feet-she has off-and-on discomfort in her feet.  No synovitis was noted.  DDD (degenerative disc disease), lumbar - MRI from [facility]--multilevel spondylosis and facet joint arthropathy.  Patient has been seeing Dr. Yetta [name], Dr. [name] and she has seen Dr. Shon Baton in the past.  She was recently evaluated by Dr. Sherol Dade.  She was placed on pregabalin 50 mg twice daily.  Patient has noticed improvement on pregabalin.  Core strengthening exercises were discussed.  Age related osteoporosis, unspecified pathological fracture presence - 02/02/2020 T score -2.8,BMD 0.649 g/cm2. 08/11/22:The BMD measured at Femur Neck Left is 0.656 g/cm2 with a T-score of -2.7.She restarted Fosamax in January 2022.  Calcium rich diet and vitamin D was discussed.  Vitamin D level was 47.19 on August 25, 2022.  Other medical problems are listed as follows:  Fatty liver  Raised intraocular pressure of both eyes  History of pulmonary embolism  History of hyperlipidemia  History of migraine  Frequent UTI    Orders: Orders Placed This Encounter  Procedures   XR Shoulder Left   XR Shoulder Right   CBC with Differential/Platelet   COMPLETE METABOLIC PANEL WITH GFR   Meds ordered this encounter  Medications   alendronate (FOSAMAX) 70 MG tablet    Sig: TAKE 1 TABLET(70 MG) BY MOUTH 1 TIME A WEEK WITH A FULL GLASS OF WATER AND ON AN EMPTY STOMACH    Dispense:  12 tablet    Refill:  0   hydroxychloroquine (PLAQUENIL) 200 MG tablet    Sig: TAKE 1 TABLET BY MOUTH TWICE DAILY MONDAY THROUGH FRIDAY ONLY    Dispense:  120 tablet    Refill:  0   methotrexate (RHEUMATREX) 2.5 MG tablet    Sig: Take 8 tablets (20 mg total) by mouth once a week.    Dispense:  96 tablet    Refill:  0     Follow-Up Instructions: Return in about 5 months (around 05/11/2023) for Rheumatoid  arthritis,  Osteoarthritis.   Pollyann Savoy, MD  Note - This record has been created using Animal nutritionist.  Chart creation errors have been sought, but may not always  have been located. Such creation errors do not reflect on  the standard of medical care.

## 2022-12-01 ENCOUNTER — Ambulatory Visit: Payer: BC Managed Care – PPO | Admitting: Psychology

## 2022-12-01 DIAGNOSIS — F4322 Adjustment disorder with anxiety: Secondary | ICD-10-CM

## 2022-12-01 NOTE — Progress Notes (Signed)
12/01/2022  Treatrment Plan:  Diagnosis F41.1 (Generalized anxiety disorder) [n/a]  Z62.820 (Parent child relationship problem) [n/a]  Symptoms Excessive and/or unrealistic worry that is difficult to control occurring more days than not for at least 6 months about a number of events or activities. (Status: maintained) -- No Description Entered  Regularly overindulges their child's wishes and demands. (Status: maintained) -- No Description Entered  Medication Status compliance  Safety none  If Suicidal or Homicidal State Action Taken: unspecified  Current Risk: low Medications unspecified Objectives Related Problem: Achieve a level of competent, effective parenting. Description: Increase the gradual letting go of their adolescent in constructive, affirmative ways. Target Date: 2023-05-15 Frequency: Daily Modality: individual Progress: 80%  Related Problem: Achieve a level of competent, effective parenting. Description: Identify unresolved childhood issues that affect parenting and work toward their resolution. Target Date: 2023-05-15 Frequency: Daily Modality: individual Progress: 80%  Related Problem: Achieve a level of competent, effective parenting. Description: Freely express feelings of frustration, helplessness, and inadequacy that each experiences in the parenting role. Target Date: 2023-05-15 Frequency: Daily Modality: individual Progress: 80%  Related Problem: Resolve the core conflict that is the source of anxiety. Description: Learn and implement problem-solving strategies for realistically addressing worries. Target Date: 2023-05-15 Frequency: Daily Modality: individual Progress: 75%  Related Problem: Resolve the core conflict that is the source of anxiety. Description: Learn and implement calming skills to reduce overall anxiety and manage anxiety symptoms. Target Date: 2023-05-15 Frequency:  Daily Modality: individual Progress: 70%  Related Problem: Resolve the core conflict that is the source of anxiety. Description: Describe situations, thoughts, feelings, and actions associated with anxieties and worries, their impact on functioning, and attempts to resolve them. Target Date: 2023-05-15 Frequency: Daily Modality: individual Progress: 70%  Client Response full compliance  Service Location Location, [address] [place], [place] [address]  Service Code cpt [phone]  Behavioral activation plan  Facilitate problem solving  Identify automatic thoughts  Emotion regulation skills  Provide education, information  Self care activities  Relaxation training  Lifestyle change (exercise, nutrition)  Self-monitoring  Validate/empathize  Session notes: F 41.1  Goals: She has chronic anxieties, mostly related to health concerns and her son's well-being. Would like to develop strategies to manage symptoms of anxiety. Needs to understand enmeshment and learn to define and manage appropriate family boundaries. Target date is 12-23. Patient has realized significant improvement in recognizing and establishing appropriate boundaries. Anxiety about health and family matters persist and she desires to continue treatment in an effort to manage and reduce these feelings. Revised target date is 12-24.  Meds: Xanax (.25 mg);   Patient agrees to a video session. She is at home and I am in my home office.   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 He drove on the trip, which was good practice. He has accepted the job offer from [employer] in [place]. [name] is distressed that he will not pick apartment in better area in order to be within walking distance from work. He is seeking to minimize stress, but recognizes the downside is that he will continue to avoid driving and therefore avoid social life. [name] is very woried that her sonn will  repeat the same pattern he established in the past. I encouraged him to seek counseling  once he moves. Also told him he needs to practice self- care. This is a significant relief for [name] in that she is more hopeful that her son will start taking better care of himself.                                                              Krystal Ridgel, PhD 10:40a-11:30a 50 min

## 2022-12-09 ENCOUNTER — Encounter: Payer: Self-pay | Admitting: Rheumatology

## 2022-12-09 ENCOUNTER — Ambulatory Visit (INDEPENDENT_AMBULATORY_CARE_PROVIDER_SITE_OTHER): Payer: BC Managed Care – PPO

## 2022-12-09 ENCOUNTER — Ambulatory Visit: Payer: BC Managed Care – PPO | Attending: Rheumatology | Admitting: Rheumatology

## 2022-12-09 ENCOUNTER — Ambulatory Visit: Payer: BC Managed Care – PPO

## 2022-12-09 VITALS — BP 101/67 | HR 55 | Resp 14 | Ht 62.5 in | Wt 157.0 lb

## 2022-12-09 DIAGNOSIS — Z79899 Other long term (current) drug therapy: Secondary | ICD-10-CM

## 2022-12-09 DIAGNOSIS — M0609 Rheumatoid arthritis without rheumatoid factor, multiple sites: Secondary | ICD-10-CM

## 2022-12-09 DIAGNOSIS — Z8669 Personal history of other diseases of the nervous system and sense organs: Secondary | ICD-10-CM

## 2022-12-09 DIAGNOSIS — M25512 Pain in left shoulder: Secondary | ICD-10-CM

## 2022-12-09 DIAGNOSIS — M19072 Primary osteoarthritis, left ankle and foot: Secondary | ICD-10-CM

## 2022-12-09 DIAGNOSIS — Z86711 Personal history of pulmonary embolism: Secondary | ICD-10-CM

## 2022-12-09 DIAGNOSIS — M25511 Pain in right shoulder: Secondary | ICD-10-CM | POA: Diagnosis not present

## 2022-12-09 DIAGNOSIS — G8929 Other chronic pain: Secondary | ICD-10-CM | POA: Diagnosis not present

## 2022-12-09 DIAGNOSIS — H40053 Ocular hypertension, bilateral: Secondary | ICD-10-CM

## 2022-12-09 DIAGNOSIS — K760 Fatty (change of) liver, not elsewhere classified: Secondary | ICD-10-CM

## 2022-12-09 DIAGNOSIS — N390 Urinary tract infection, site not specified: Secondary | ICD-10-CM

## 2022-12-09 DIAGNOSIS — M170 Bilateral primary osteoarthritis of knee: Secondary | ICD-10-CM | POA: Diagnosis not present

## 2022-12-09 DIAGNOSIS — M5136 Other intervertebral disc degeneration, lumbar region: Secondary | ICD-10-CM

## 2022-12-09 DIAGNOSIS — M810 Age-related osteoporosis without current pathological fracture: Secondary | ICD-10-CM

## 2022-12-09 DIAGNOSIS — Z8639 Personal history of other endocrine, nutritional and metabolic disease: Secondary | ICD-10-CM

## 2022-12-09 DIAGNOSIS — M19071 Primary osteoarthritis, right ankle and foot: Secondary | ICD-10-CM

## 2022-12-09 DIAGNOSIS — M3500 Sicca syndrome, unspecified: Secondary | ICD-10-CM

## 2022-12-09 DIAGNOSIS — M79605 Pain in left leg: Secondary | ICD-10-CM

## 2022-12-09 LAB — CBC WITH DIFFERENTIAL/PLATELET
Absolute Monocytes: 656 cells/uL (ref 200–950)
Basophils Absolute: 41 cells/uL (ref 0–200)
Eosinophils Absolute: 98 cells/uL (ref 15–500)
MCV: 84.9 fL (ref 80.0–100.0)
MPV: 11.2 fL (ref 7.5–12.5)
Monocytes Relative: 8 %
RBC: 4.9 10*6/uL (ref 3.80–5.10)
RDW: 12.4 % (ref 11.0–15.0)
WBC: 8.2 10*3/uL (ref 3.8–10.8)

## 2022-12-09 MED ORDER — METHOTREXATE SODIUM 2.5 MG PO TABS: 20.0000 mg | ORAL_TABLET | ORAL | 0 refills | Status: DC

## 2022-12-09 MED ORDER — HYDROXYCHLOROQUINE SULFATE 200 MG PO TABS: ORAL_TABLET | ORAL | 0 refills | Status: DC

## 2022-12-09 MED ORDER — ALENDRONATE SODIUM 70 MG PO TABS: ORAL_TABLET | ORAL | 0 refills | Status: DC

## 2022-12-09 NOTE — Addendum Note (Signed)
Addended by: Ellen Henri on: 12/09/2022 04:14 PM   Modules accepted: Orders

## 2022-12-09 NOTE — Patient Instructions (Addendum)
Standing Labs We placed an order today for your standing lab work.   Please have your standing labs drawn in October and every 3 months  Please have your labs drawn 2 weeks prior to your appointment so that the provider can discuss your lab results at your appointment, if possible.  Please note that you may see your imaging and lab results in MyChart before we have reviewed them. We will contact you once all results are reviewed. Please allow our office up to 72 hours to thoroughly review all of the results before contacting the office for clarification of your results.  WALK-IN LAB HOURS  Monday through Thursday from 8:00 am -12:30 pm and 1:00 pm-5:00 pm and Friday from 8:00 am-12:00 pm.  Patients with office visits requiring labs will be seen before walk-in labs.  You may encounter longer than normal wait times. Please allow additional time. Wait times may be shorter on  Monday and Thursday afternoons.  We do not book appointments for walk-in labs. We appreciate your patience and understanding with our staff.   Labs are drawn by [facility]. Please bring your co-pay at the time of your lab draw.  You may receive a bill from [facility] for your lab work.  Please note if you are on Hydroxychloroquine and and an order has been placed for a Hydroxychloroquine level,  you will need to have it drawn 4 hours or more after your last dose.  If you wish to have your labs drawn at another location, please call the office 24 hours in advance so we can fax the orders.  The office is located at [address], [place], [place] [address]   If you have any questions regarding directions or hours of operation,  please call [phone].   As a reminder, please drink plenty of water prior to coming for your lab work. Thanks!   Vaccines You are taking a medication(s) that can suppress your immune system.  The following immunizations are recommended: Flu annually Covid-19  RSV Td/Tdap (tetanus,  diphtheria, pertussis) every 10 years Pneumonia (Prevnar 15 then Pneumovax 23 at least 1 year apart.  Alternatively, can take Prevnar 20 without needing additional dose) Shingrix: 2 doses from 4 weeks to 6 months apart  Please check with your PCP to make sure you are up to date.   If you have signs or symptoms of an infection or start antibiotics: First, call your PCP for workup of your infection. Hold your medication through the infection, until you complete your antibiotics, and until symptoms resolve if you take the following: Injectable medication (Actemra, Benlysta, Cimzia, Cosentyx, Enbrel, Humira, Kevzara, Orencia, Remicade, Simponi, Stelara, Taltz, Tremfya) Methotrexate Leflunomide (Arava) Mycophenolate (Cellcept) Harriette Ohara, Olumiant, or Rinvoq

## 2022-12-10 LAB — COMPLETE METABOLIC PANEL WITH GFR
AG Ratio: 1.6 (calc) (ref 1.0–2.5)
ALT: 15 U/L (ref 6–29)
AST: 20 U/L (ref 10–35)
Albumin: 4.2 g/dL (ref 3.6–5.1)
Alkaline phosphatase (APISO): 80 U/L (ref 37–153)
BUN: 14 mg/dL (ref 7–25)
CO2: 30 mmol/L (ref 20–32)
Calcium: 9.3 mg/dL (ref 8.6–10.4)
Chloride: 106 mmol/L (ref 98–110)
Creat: 0.77 mg/dL (ref 0.50–1.05)
Globulin: 2.7 g/dL (calc) (ref 1.9–3.7)
Glucose, Bld: 81 mg/dL (ref 65–99)
Potassium: 4.8 mmol/L (ref 3.5–5.3)
Sodium: 141 mmol/L (ref 135–146)
Total Bilirubin: 0.6 mg/dL (ref 0.2–1.2)
Total Protein: 6.9 g/dL (ref 6.1–8.1)
eGFR: 86 mL/min/{1.73_m2} (ref >=60)

## 2022-12-10 LAB — CBC WITH DIFFERENTIAL/PLATELET
Basophils Relative: 0.5 %
Eosinophils Relative: 1.2 %
HCT: 41.6 % (ref 35.0–45.0)
Hemoglobin: 13.7 g/dL (ref 11.7–15.5)
Lymphs Abs: 2591 cells/uL (ref 850–3900)
MCH: 28 pg (ref 27.0–33.0)
MCHC: 32.9 g/dL (ref 32.0–36.0)
Neutro Abs: 4813 cells/uL (ref 1500–7800)
Neutrophils Relative %: 58.7 %
Platelets: 291 10*3/uL (ref 140–400)
Total Lymphocyte: 31.6 %

## 2022-12-10 NOTE — Progress Notes (Signed)
CBC and CMP normal

## 2022-12-15 ENCOUNTER — Ambulatory Visit: Payer: BC Managed Care – PPO | Admitting: Psychology

## 2022-12-15 DIAGNOSIS — F4322 Adjustment disorder with anxiety: Secondary | ICD-10-CM

## 2022-12-15 NOTE — Progress Notes (Signed)
12/15/2022  Treatrment Plan:  Diagnosis F41.1 (Generalized anxiety disorder) [n/a]  Z62.820 (Parent child relationship problem) [n/a]  Symptoms Excessive and/or unrealistic worry that is difficult to control occurring more days than not for at least 6 months about a number of events or activities. (Status: maintained) -- No Description Entered  Regularly overindulges their child's wishes and demands. (Status: maintained) -- No Description Entered  Medication Status compliance  Safety none  If Suicidal or Homicidal State Action Taken: unspecified  Current Risk: low Medications unspecified Objectives Related Problem: Achieve a level of competent, effective parenting. Description: Increase the gradual letting go of their adolescent in constructive, affirmative ways. Target Date: 2023-05-15 Frequency: Daily Modality: individual Progress: 80%  Related Problem: Achieve a level of competent, effective parenting. Description: Identify unresolved childhood issues that affect parenting and work toward their resolution. Target Date: 2023-05-15 Frequency: Daily Modality: individual Progress: 80%  Related Problem: Achieve a level of competent, effective parenting. Description: Freely express feelings of frustration, helplessness, and inadequacy that each experiences in the parenting role. Target Date: 2023-05-15 Frequency: Daily Modality: individual Progress: 80%  Related Problem: Resolve the core conflict that is the source of anxiety. Description: Learn and implement problem-solving strategies for realistically addressing worries. Target Date: 2023-05-15 Frequency: Daily Modality: individual Progress: 75%  Related Problem: Resolve the core conflict that is the source of anxiety. Description: Learn and implement calming skills to reduce overall anxiety and manage anxiety symptoms. Target  Date: 2023-05-15 Frequency: Daily Modality: individual Progress: 70%  Related Problem: Resolve the core conflict that is the source of anxiety. Description: Describe situations, thoughts, feelings, and actions associated with anxieties and worries, their impact on functioning, and attempts to resolve them. Target Date: 2023-05-15 Frequency: Daily Modality: individual Progress: 70%  Client Response full compliance  Service Location Location, [address] [place], [place] [address]  Service Code cpt [phone]  Behavioral activation plan  Facilitate problem solving  Identify automatic thoughts  Emotion regulation skills  Provide education, information  Self care activities  Relaxation training  Lifestyle change (exercise, nutrition)  Self-monitoring  Validate/empathize  Session notes: F 41.1  Goals: She has chronic anxieties, mostly related to health concerns and her son's well-being. Would like to develop strategies to manage symptoms of anxiety. Needs to understand enmeshment and learn to define and manage appropriate family boundaries. Target date is 12-23. Patient has realized significant improvement in recognizing and establishing appropriate boundaries. Anxiety about health and family matters persist and she desires to continue treatment in an effort to manage and reduce these feelings. Revised target date is 12-24.  Meds: Xanax (.25 mg);   Patient agrees to a video session. She is at home and I am in my home office.   Gunhild and [name]: [name] flies out Sunday to [employer] and moves in to new apartment in [place]. He is moving to an apartment close to work. He thinks it is a great location near lots of social and business locations. He says he "feels fine about it, but cannot say he is excited. He has enjoyed being home and says it is time to move out. His apprehension is that his history with work has not been positive. I emphasized that it is crucial for  him to get a  therapist in his new location to work with him on how to balance life. This will be a significant relief for Analeigha, who is chronically worried about his happiness. Dimon is especially worried that [name] is not committed to changing his lifestyle. Will get him referral resouces in his area. Vidya states she wants to work on keeping calm and maintaining her boundaries with [name]. She was doing better, but this new job situation has re-ignited her anxiety.                                                                  [name], PhD 10:40a-11:30a 50 min

## 2022-12-17 ENCOUNTER — Encounter (INDEPENDENT_AMBULATORY_CARE_PROVIDER_SITE_OTHER): Payer: Self-pay

## 2022-12-25 ENCOUNTER — Ambulatory Visit: Payer: BC Managed Care – PPO

## 2022-12-31 ENCOUNTER — Ambulatory Visit: Payer: BC Managed Care – PPO

## 2022-12-31 ENCOUNTER — Ambulatory Visit (INDEPENDENT_AMBULATORY_CARE_PROVIDER_SITE_OTHER): Payer: BC Managed Care – PPO

## 2022-12-31 DIAGNOSIS — Z7952 Long term (current) use of systemic steroids: Secondary | ICD-10-CM

## 2022-12-31 DIAGNOSIS — Z7901 Long term (current) use of anticoagulants: Secondary | ICD-10-CM

## 2022-12-31 LAB — POCT INR: INR: 1.6 — AB (ref 2.0–3.0)

## 2022-12-31 MED ORDER — WARFARIN SODIUM 2.5 MG PO TABS
ORAL_TABLET | ORAL | 1 refills | Status: AC
Start: 2022-12-31 — End: ?

## 2022-12-31 NOTE — Patient Instructions (Addendum)
Pre visit review using our clinic review tool, if applicable. No additional management support is needed unless otherwise documented below in the visit note.  Continue 5 mg (2 tablets) daily except take 2.5 mg (1 tablet) on Sundays and Thursdays and take 3.75 mg (1 1/2 tablets) on Tuesdays. Recheck in 6 weeks.

## 2022-12-31 NOTE — Progress Notes (Signed)
Continue 5 mg (2 tablets) daily except take 2.5 mg (1 tablet) on Sundays and Thursdays and take 3.75 mg (1 1/2 tablets) on Tuesdays. Recheck in 6 weeks.

## 2023-01-01 ENCOUNTER — Encounter (INDEPENDENT_AMBULATORY_CARE_PROVIDER_SITE_OTHER): Payer: Self-pay

## 2023-01-12 ENCOUNTER — Ambulatory Visit: Payer: BC Managed Care – PPO | Admitting: Psychology

## 2023-01-14 ENCOUNTER — Ambulatory Visit: Payer: BC Managed Care – PPO | Admitting: Internal Medicine

## 2023-01-14 ENCOUNTER — Encounter: Payer: Self-pay | Admitting: Internal Medicine

## 2023-01-14 VITALS — BP 116/74 | HR 62 | Temp 97.9°F | Ht 62.5 in | Wt 160.2 lb

## 2023-01-14 DIAGNOSIS — Z79899 Other long term (current) drug therapy: Secondary | ICD-10-CM | POA: Diagnosis not present

## 2023-01-14 DIAGNOSIS — M549 Dorsalgia, unspecified: Secondary | ICD-10-CM | POA: Diagnosis not present

## 2023-01-14 DIAGNOSIS — R300 Dysuria: Secondary | ICD-10-CM

## 2023-01-14 DIAGNOSIS — Z87440 Personal history of urinary (tract) infections: Secondary | ICD-10-CM

## 2023-01-14 LAB — POCT URINALYSIS DIPSTICK
Bilirubin, UA: NEGATIVE
Blood, UA: POSITIVE
Glucose, UA: NEGATIVE
Ketones, UA: NEGATIVE
Nitrite, UA: NEGATIVE
Protein, UA: NEGATIVE
Urobilinogen, UA: 0.2 E.U./dL
pH, UA: 6 (ref 5.0–8.0)

## 2023-01-14 NOTE — Patient Instructions (Addendum)
Glad the lyrica is helping you. For now.   Re culturing the urine  for the small  amount of leukocytes in urine.  Ask and consider  rethinking  adding estrogen cream to  see if helps prevent infection.Marland Kitchen

## 2023-01-14 NOTE — Progress Notes (Signed)
Chief Complaint  Patient presents with   Dysuria    Sx 2wks ago. Urology given cefalexin  . Urgent urination, urination frequency. Vaginal pain. Taken cefalexin for 5 days. Sx better.     HPI: [name] 62 y.o. come in for  fu after rx uti per uro Keflex 500 bid for 5 days  Antibitioc      keflex  8 16 - 8 20    5  days  bid  never totally better  but  also vagina .inflamed.  But no DC    Uses coconut  oil    concern about   risk of estrogen cream.   Dr   [name] Foot  given lyrica .   Which seems to have helped a good bit   and much better after a week or 2     tolerable  now  medication now . Is pleased so far  asks thoughts  ROS: See pertinent positives and negatives per HPI.  Past Medical History:  Diagnosis Date   Adjustment disorder with anxiety    B12 deficiency due to diet    is a vegetarian   Chest pain, unspecified    Chronic anticoagulation    Fatty liver    Per patient   GERD (gastroesophageal reflux disease)    Headache(784.0)    migraines and head pain   Hx of varicella    As Child   Iron deficiency anemia, unspecified    Otalgia, unspecified    Other and unspecified hyperlipidemia    Palpitations    Personal history of venous thrombosis and embolism    Recurrent UTI    Rheumatoid arthritis(714.0)    deveshewar   Unspecified vitamin D deficiency    UTI (lower urinary tract infection) 11/28/2011   citrobacter       Family History  Problem Relation Age of Onset   Parkinsonism Mother    Dementia Father    Stroke Father    Hyperlipidemia Father    Diabetes Father    Lymphoma Other    Colon cancer Neg Hx    Colon polyps Neg Hx    Esophageal cancer Neg Hx    Stomach cancer Neg Hx    Rectal cancer Neg Hx    Breast cancer Neg Hx     Social History   Socioeconomic History   Marital status: Married    Spouse name: Not on file   Number of children: 1   Years of education: Not on file   Highest education level: Bachelor's degree (e.g., BA, AB,  BS)  Occupational History   Occupation: home maker  Tobacco Use   Smoking status: Never    Passive exposure: Past   Smokeless tobacco: Never  Vaping Use   Vaping status: Never Used  Substance and Sexual Activity   Alcohol use: No   Drug use: Never   Sexual activity: Not on file  Other Topics Concern   Not on file  Social History Narrative   Left handed   Caffeine use: Tea daily, coffee once a week   Regular Exercise-no20 yrs in [place] [employer] [place].Married HH  of 3 G1P1   Social Determinants of Health   Financial Resource Strain: Low Risk  (08/26/2022)   Overall Financial Resource Strain (CARDIA)    Difficulty of Paying Living Expenses: Not hard at all  Food Insecurity: No Food Insecurity (08/26/2022)   Hunger Vital Sign    Worried About Running Out of Food in the Last Year:  Never true    Ran Out of Food in the Last Year: Never true  Transportation Needs: No Transportation Needs (08/26/2022)   PRAPARE - Administrator, Civil Service (Medical): No    Lack of Transportation (Non-Medical): No  Physical Activity: Insufficiently Active (08/26/2022)   Exercise Vital Sign    Days of Exercise per Week: 2 days    Minutes of Exercise per Session: 40 min  Stress: Stress Concern Present (08/26/2022)   [employer] of Occupational Health - Occupational Stress Questionnaire    Feeling of Stress : To some extent  Social Connections: Unknown (08/26/2022)   Social Connection and Isolation Panel [NHANES]    Frequency of Communication with Friends and Family: Twice a week    Frequency of Social Gatherings with Friends and Family: Patient declined    Attends Religious Services: Patient declined    Database administrator or Organizations: No    Attends Engineer, structural: Not on file    Marital Status: Married    Outpatient Medications Prior to Visit  Medication Sig Dispense Refill   acetaminophen (TYLENOL) 500 MG tablet Take 500 mg by mouth as needed.      alendronate (FOSAMAX) 70 MG tablet TAKE 1 TABLET(70 MG) BY MOUTH 1 TIME A WEEK WITH A FULL GLASS OF WATER AND ON AN EMPTY STOMACH 12 tablet 0   Calcium Carb-Cholecalciferol 600-800 MG-UNIT TABS Take by mouth.     Cholecalciferol (VITAMIN D3) 2000 UNITS TABS Take 1 tablet by mouth daily.     Cranberry 1000 MG CAPS Take by mouth. 3 times a week     Cyanocobalamin (VITAMIN B-12 PO) Take by mouth once a week.     cycloSPORINE (RESTASIS) 0.05 % ophthalmic emulsion Place 1 drop into both eyes daily.     Eflornithine HCl 13.9 % cream Apply topically daily.      Famotidine (PEPCID PO) Take by mouth 4 (four) times a week.     folic acid (FOLVITE) 1 MG tablet TAKE 2 TABLETS(2 MG) BY MOUTH DAILY 180 tablet 3   hydroxychloroquine (PLAQUENIL) 200 MG tablet TAKE 1 TABLET BY MOUTH TWICE DAILY MONDAY THROUGH FRIDAY ONLY 120 tablet 0   methotrexate (RHEUMATREX) 2.5 MG tablet Take 8 tablets (20 mg total) by mouth once a week. 96 tablet 0   mineral/vitamin supplement (MULTIGEN) 70 MG TABS tablet Take 1 tablet (70 mg total) by mouth daily. 90 tablet 1   pregabalin (LYRICA) 50 MG capsule One po qAM and two po qHS (Patient taking differently: Take 50 mg by mouth 2 (two) times daily.) 90 capsule 5   SUMAtriptan (IMITREX) 50 MG tablet TAKE ONE TABLET BY MOUTH AS NEEDED. MAY REPEAT IN 2 TO 4 HOURS IF NEEDED. 9 tablet 1   warfarin (COUMADIN) 2.5 MG tablet TAKE 2 TABLETS BY MOUTH DAILY, EXCEPT TAKE  1 TABLET ON SUNDAYS AND THURSDAYS AND TAKE 1 1/2 TABLETS ON TUESDAYS OR AS DIRECTED BY ANTICOAGULATION CLINIC 180 tablet 1   ALPRAZolam (XANAX) 0.25 MG tablet Take 1 tablet (0.25 mg total) by mouth 2 (two) times daily as needed for anxiety. (Patient not taking: Reported on 01/14/2023) 20 tablet 0   Camphor-Menthol-Methyl Sal (SALONPAS EX) Apply topically as needed. (Patient not taking: Reported on 12/09/2022)     nirmatrelvir/ritonavir (PAXLOVID) 20 x 150 MG & 10 x 100MG  TABS Patient GFR is 92 Take nirmatrelvir (150 mg) 2 tablet(s)  twice daily for 5 days and ritonavir (100 mg) one tablet twice daily for  5 days.take  Medication together. (Patient not taking: Reported on 08/27/2022) 30 tablet 0   Omeprazole Magnesium (PRILOSEC PO) Take by mouth 4 (four) times a week. (Patient not taking: Reported on 12/09/2022)     cyclobenzaprine (FLEXERIL) 5 MG tablet Take 0.5-1 tablets (2.5-5 mg total) by mouth at bedtime as needed for muscle spasms. 20 tablet 3   No facility-administered medications prior to visit.     EXAM:  BP 116/74 (BP Location: Right Arm, Patient Position: Sitting, Cuff Size: Normal)   Pulse 62   Temp 97.9 F (36.6 C) (Oral)   Ht 5' 2.5" (1.588 m)   Wt 160 lb 3.2 oz (72.7 kg)   SpO2 97%   BMI 28.83 kg/m   Body mass index is 28.83 kg/m.  GENERAL: vitals reviewed and listed above, alert, oriented, appears well hydrated and in no acute distress HEENT: atraumatic, conjunctiva  clear, no obvious abnormalities on inspection of external nose and ears MS: moves all extremities without noticeable focal  abnormality gait seems nl  PSYCH: pleasant and cooperative, nl cognition  Lab Results  Component Value Date   WBC 8.2 12/09/2022   HGB 13.7 12/09/2022   HCT 41.6 12/09/2022   PLT 291 12/09/2022   GLUCOSE 81 12/09/2022   CHOL 209 (H) 08/25/2022   TRIG 193.0 (H) 08/25/2022   HDL 80.70 08/25/2022   LDLDIRECT 97.3 05/25/2013   LDLCALC 89 08/25/2022   ALT 15 12/09/2022   AST 20 12/09/2022   NA 141 12/09/2022   K 4.8 12/09/2022   CL 106 12/09/2022   CREATININE 0.77 12/09/2022   BUN 14 12/09/2022   CO2 30 12/09/2022   TSH 2.84 08/25/2022   INR 1.6 (A) 12/31/2022   HGBA1C 6.1 08/25/2022   BP Readings from Last 3 Encounters:  01/14/23 116/74  12/09/22 101/67  10/08/22 108/68   Lab Results  Component Value Date   COLORU yellow 01/14/2023   CLARITYU Clear 01/14/2023   GLUCOSEUR Negative 01/14/2023   BILIRUBINUR Negative 01/14/2023   KETONESU Negative 01/14/2023   SPECGRAV  01/14/2023     Comment:      1.005   RBCUR Positive 01/14/2023   PHUR 6.0 01/14/2023   PROTEINUR Negative 01/14/2023   UROBILINOGEN 0.2 01/14/2023   LEUKOCYTESUR Small (1+) (A) 01/14/2023     ASSESSMENT AND PLAN:  Discussed the following assessment and plan:  Dysuria - Plan: POC Urinalysis Dipstick, Urine Culture, Urine Culture  History of recurrent UTIs  Back pain with radiation  Medication management S/p uti rx   do cx  just in case .  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 She is not on a high dose  Disc covid booster would get newer version in fall 9 (she has never had documented  infection uses  avoidance to help) Still think she would be helped by topical estrogen but she is fearful reluctant cause of possible risk of clotting  That I do not think is significant  if limited and is on anticoagulation Advise disc with urology also for opinion -Patient advised to return or notify health care team  if  new concerns arise.  Patient Instructions  Glad the lyrica is helping you. For now.   Re culturing the urine  for the small  amount of leukocytes in urine.  Ask and consider  rethinking  adding estrogen cream to  see if helps prevent infection.Neta Mends. Laberta Wilbon M.D.

## 2023-01-15 ENCOUNTER — Encounter: Payer: Self-pay | Admitting: Internal Medicine

## 2023-01-15 ENCOUNTER — Ambulatory Visit: Payer: BC Managed Care – PPO

## 2023-01-15 LAB — URINE CULTURE
MICRO NUMBER:: 15393939
SPECIMEN QUALITY:: ADEQUATE

## 2023-01-16 ENCOUNTER — Other Ambulatory Visit: Payer: Self-pay | Admitting: Family

## 2023-01-16 ENCOUNTER — Encounter: Payer: Self-pay | Admitting: Internal Medicine

## 2023-01-16 MED ORDER — CEPHALEXIN 500 MG PO CAPS: 500.0000 mg | ORAL_CAPSULE | Freq: Three times a day (TID) | ORAL | 0 refills | Status: DC

## 2023-01-16 NOTE — Progress Notes (Signed)
urine culture showed multiple bacteria but not predominant germ.  Most likely not a UTI ; can get this finding  with external skin bacteria  getting into sample . And not a worrisome finding   . Suggest  follow and if gets worsening sx then re culture and or contact urology.

## 2023-01-26 ENCOUNTER — Ambulatory Visit: Payer: BC Managed Care – PPO | Admitting: Psychology

## 2023-01-26 DIAGNOSIS — F411 Generalized anxiety disorder: Secondary | ICD-10-CM

## 2023-01-26 DIAGNOSIS — F4322 Adjustment disorder with anxiety: Secondary | ICD-10-CM

## 2023-01-26 NOTE — Progress Notes (Signed)
01/26/2023  Treatrment Plan:  Diagnosis F41.1 (Generalized anxiety disorder) [n/a]  Z62.820 (Parent child relationship problem) [n/a]  Symptoms Excessive and/or unrealistic worry that is difficult to control occurring more days than not for at least 6 months about a number of events or activities. (Status: maintained) -- No Description Entered  Regularly overindulges their child's wishes and demands. (Status: maintained) -- No Description Entered  Medication Status compliance  Safety none  If Suicidal or Homicidal State Action Taken: unspecified  Current Risk: low Medications unspecified Objectives Related Problem: Achieve a level of competent, effective parenting. Description: Increase the gradual letting go of their adolescent in constructive, affirmative ways. Target Date: 2023-05-15 Frequency: Daily Modality: individual Progress: 80%  Related Problem: Achieve a level of competent, effective parenting. Description: Identify unresolved childhood issues that affect parenting and work toward their resolution. Target Date: 2023-05-15 Frequency: Daily Modality: individual Progress: 80%  Related Problem: Achieve a level of competent, effective parenting. Description: Freely express feelings of frustration, helplessness, and inadequacy that each experiences in the parenting role. Target Date: 2023-05-15 Frequency: Daily Modality: individual Progress: 80%  Related Problem: Resolve the core conflict that is the source of anxiety. Description: Learn and implement problem-solving strategies for realistically addressing worries. Target Date: 2023-05-15 Frequency: Daily Modality: individual Progress: 75%  Related Problem: Resolve the core conflict that is the source of anxiety. Description: Learn and implement calming skills to reduce overall anxiety and manage  anxiety symptoms. Target Date: 2023-05-15 Frequency: Daily Modality: individual Progress: 70%  Related Problem: Resolve the core conflict that is the source of anxiety. Description: Describe situations, thoughts, feelings, and actions associated with anxieties and worries, their impact on functioning, and attempts to resolve them. Target Date: 2023-05-15 Frequency: Daily Modality: individual Progress: 70%  Client Response full compliance  Service Location Location, [address] [place], [place] [address]  Service Code cpt [phone]  Behavioral activation plan  Facilitate problem solving  Identify automatic thoughts  Emotion regulation skills  Provide education, information  Self care activities  Relaxation training  Lifestyle change (exercise, nutrition)  Self-monitoring  Validate/empathize  Session notes: F 41.1  Goals: She has chronic anxieties, mostly related to health concerns and her son's well-being. Would like to develop strategies to manage symptoms of anxiety. Needs to understand enmeshment and learn to define and manage appropriate family boundaries. Target date is 12-23. Patient has realized significant improvement in recognizing and establishing appropriate boundaries. Anxiety about health and family matters persist and she desires to continue treatment in an effort to manage and reduce these feelings. Revised target date is 12-24.  Meds: Xanax (.25 mg);   Patient agrees to a video session. She is at home and I am in my home office.   Permelia and [name]: [name] says that he is in to his 4th week at work. He says team and manager "seems nice" and "it is a sophisticated company". To date, he is just doing a lot of reading. He reports that he is not spending extra time a night working late, but he is worried he will fall behind. He is trying an on-line dating, which is significant progress. He also  got in touch with an old friend and went to [facility]. He plans to  call other people as well. Mystique and her husband have had disagreements about bringing over a niece from [place] who is living with them and going to graduate school. Sorouch feels his father is getting used and he is enraged with his father. Hoy Finlay says her husband hid all of the arrangements from her. She had only 48 hours notice to prepare before she came. They are both very distressed and angry. She is at a loss of how to manage the situation with her husband. She and I will have an individual meeting to discuss.                                                                     [name], PhD 10:40a-11:30a 50 min

## 2023-02-09 ENCOUNTER — Ambulatory Visit (INDEPENDENT_AMBULATORY_CARE_PROVIDER_SITE_OTHER): Payer: BC Managed Care – PPO | Admitting: Psychology

## 2023-02-09 DIAGNOSIS — Z62820 Parent-biological child conflict: Secondary | ICD-10-CM

## 2023-02-09 DIAGNOSIS — F411 Generalized anxiety disorder: Secondary | ICD-10-CM | POA: Diagnosis not present

## 2023-02-09 DIAGNOSIS — F4322 Adjustment disorder with anxiety: Secondary | ICD-10-CM

## 2023-02-09 NOTE — Progress Notes (Signed)
02/09/2023  Treatrment Plan:  Diagnosis F41.1 (Generalized anxiety disorder) [n/a]  Z62.820 (Parent child relationship problem) [n/a]  Symptoms Excessive and/or unrealistic worry that is difficult to control occurring more days than not for at least 6 months about a number of events or activities. (Status: maintained) -- No Description Entered  Regularly overindulges their child's wishes and demands. (Status: maintained) -- No Description Entered  Medication Status compliance  Safety none  If Suicidal or Homicidal State Action Taken: unspecified  Current Risk: low Medications unspecified Objectives Related Problem: Achieve a level of competent, effective parenting. Description: Increase the gradual letting go of their adolescent in constructive, affirmative ways. Target Date: 2023-05-15 Frequency: Daily Modality: individual Progress: 80%  Related Problem: Achieve a level of competent, effective parenting. Description: Identify unresolved childhood issues that affect parenting and work toward their resolution. Target Date: 2023-05-15 Frequency: Daily Modality: individual Progress: 80%  Related Problem: Achieve a level of competent, effective parenting. Description: Freely express feelings of frustration, helplessness, and inadequacy that each experiences in the parenting role. Target Date: 2023-05-15 Frequency: Daily Modality: individual Progress: 80%  Related Problem: Resolve the core conflict that is the source of anxiety. Description: Learn and implement problem-solving strategies for realistically addressing worries. Target Date: 2023-05-15 Frequency: Daily Modality: individual Progress: 75%  Related Problem: Resolve the core conflict that is the source of anxiety. Description: Learn and implement calming skills to  reduce overall anxiety and manage anxiety symptoms. Target Date: 2023-05-15 Frequency: Daily Modality: individual Progress: 70%  Related Problem: Resolve the core conflict that is the source of anxiety. Description: Describe situations, thoughts, feelings, and actions associated with anxieties and worries, their impact on functioning, and attempts to resolve them. Target Date: 2023-05-15 Frequency: Daily Modality: individual Progress: 70%  Client Response full compliance  Service Location Location, [address] [place], [place] [address]  Service Code cpt [phone]  Behavioral activation plan  Facilitate problem solving  Identify automatic thoughts  Emotion regulation skills  Provide education, information  Self care activities  Relaxation training  Lifestyle change (exercise, nutrition)  Self-monitoring  Validate/empathize  Session notes: F 41.1  Goals: She has chronic anxieties, mostly related to health concerns and her son's well-being. Would like to develop strategies to manage symptoms of anxiety. Needs to understand enmeshment and learn to define and manage appropriate family boundaries. Target date is 12-23. Patient has realized significant improvement in recognizing and establishing appropriate boundaries. Anxiety about health and family matters persist and she desires to continue treatment in an effort to manage and reduce these feelings. Revised target date is 12-24.  Meds: Xanax (.25 mg);   Patient agrees to a video session. She is at home and I am in my home office.   Reta says that the niece is moving out of their house and she is relieved. Dayami is concerned, however, that she will continue to be a problem and will look to she and her husband for financial support. She and her husband have different opinions about how much to help their niece. She talked about her  husband's avoidance to have any conversation about any challenging conversations. We talked  about trying to manage a situation with spouse that has never allowed difficult conversations. She has invested most of her energy into her relationship with her son rather than her husband. The result is the enmeshed relationship she has with [name]. Supporting her to assert her need to share her thoughts and feelsings with husband in spite of her belief that it will not change their dynamic.                                                                       [name], PhD 10:40a-11:30a 50 min

## 2023-02-10 ENCOUNTER — Telehealth: Payer: Self-pay

## 2023-02-10 ENCOUNTER — Encounter: Payer: Self-pay | Admitting: Rheumatology

## 2023-02-10 MED ORDER — PREDNISONE 5 MG PO TABS
ORAL_TABLET | ORAL | 0 refills | Status: DC
Start: 2023-02-10 — End: 2023-04-23

## 2023-02-10 NOTE — Telephone Encounter (Signed)
Pt returned call and RS coumadin clinic apt for 9/30

## 2023-02-10 NOTE — Telephone Encounter (Signed)
Need to RS pt's coumadin clinic apt from tomorrow to next week.  LVM requesting call back to RS.

## 2023-02-10 NOTE — Telephone Encounter (Signed)
It is okay for her to take prednisone with pregabalin.  Please send a prescription for prednisone starting at 20 mg and taper by 5 mg every 2 days.

## 2023-02-11 ENCOUNTER — Ambulatory Visit: Payer: BC Managed Care – PPO

## 2023-02-16 ENCOUNTER — Ambulatory Visit (INDEPENDENT_AMBULATORY_CARE_PROVIDER_SITE_OTHER): Payer: BC Managed Care – PPO

## 2023-02-16 DIAGNOSIS — Z7901 Long term (current) use of anticoagulants: Secondary | ICD-10-CM

## 2023-02-16 LAB — POCT INR: INR: 1.8 — AB (ref 2.0–3.0)

## 2023-02-16 MED ORDER — WARFARIN SODIUM 2.5 MG PO TABS
ORAL_TABLET | ORAL | 1 refills | Status: DC
Start: 2023-02-16 — End: 2023-07-15

## 2023-02-16 NOTE — Patient Instructions (Addendum)
Pre visit review using our clinic review tool, if applicable. No additional management support is needed unless otherwise documented below in the visit note.  Continue 5 mg (2 tablets) daily except take 2.5 mg (1 tablet) on Sundays and Thursdays and take 3.75 mg (1 1/2 tablets) on Tuesdays. Recheck in 6 weeks.

## 2023-02-16 NOTE — Progress Notes (Cosign Needed Addendum)
Continue 5 mg (2 tablets) daily except take 2.5 mg (1 tablet) on Sundays and Thursdays and take 3.75 mg (1 1/2 tablets) on Tuesdays. Recheck in 6 weeks.   Pt is compliant with warfarin management and PCP apts.  Pt reported there was a problem with the refill remaining on warfarin. Sent in refill of warfarin to requested pharmacy.

## 2023-02-23 ENCOUNTER — Ambulatory Visit: Payer: BC Managed Care – PPO | Admitting: Psychology

## 2023-02-23 DIAGNOSIS — F411 Generalized anxiety disorder: Secondary | ICD-10-CM | POA: Diagnosis not present

## 2023-02-23 DIAGNOSIS — F4322 Adjustment disorder with anxiety: Secondary | ICD-10-CM

## 2023-02-23 NOTE — Progress Notes (Signed)
02/23/2023  Treatrment Plan:  Diagnosis F41.1 (Generalized anxiety disorder) [n/a]  Z62.820 (Parent child relationship problem) [n/a]  Symptoms Excessive and/or unrealistic worry that is difficult to control occurring more days than not for at least 6 months about a number of events or activities. (Status: maintained) -- No Description Entered  Regularly overindulges their child's wishes and demands. (Status: maintained) -- No Description Entered  Medication Status compliance  Safety none  If Suicidal or Homicidal State Action Taken: unspecified  Current Risk: low Medications unspecified Objectives Related Problem: Achieve a level of competent, effective parenting. Description: Increase the gradual letting go of their adolescent in constructive, affirmative ways. Target Date: 2023-05-15 Frequency: Daily Modality: individual Progress: 80%  Related Problem: Achieve a level of competent, effective parenting. Description: Identify unresolved childhood issues that affect parenting and work toward their resolution. Target Date: 2023-05-15 Frequency: Daily Modality: individual Progress: 80%  Related Problem: Achieve a level of competent, effective parenting. Description: Freely express feelings of frustration, helplessness, and inadequacy that each experiences in the parenting role. Target Date: 2023-05-15 Frequency: Daily Modality: individual Progress: 80%  Related Problem: Resolve the core conflict that is the source of anxiety. Description: Learn and implement problem-solving strategies for realistically addressing worries. Target Date: 2023-05-15 Frequency: Daily Modality: individual Progress: 75%  Related Problem: Resolve the core conflict that is the source of anxiety. Description: Learn and  implement calming skills to reduce overall anxiety and manage anxiety symptoms. Target Date: 2023-05-15 Frequency: Daily Modality: individual Progress: 70%  Related Problem: Resolve the core conflict that is the source of anxiety. Description: Describe situations, thoughts, feelings, and actions associated with anxieties and worries, their impact on functioning, and attempts to resolve them. Target Date: 2023-05-15 Frequency: Daily Modality: individual Progress: 70%  Client Response full compliance  Service Location Location, [address] [place], [place] [address]  Service Code cpt [phone]  Behavioral activation plan  Facilitate problem solving  Identify automatic thoughts  Emotion regulation skills  Provide education, information  Self care activities  Relaxation training  Lifestyle change (exercise, nutrition)  Self-monitoring  Validate/empathize  Session notes: F 41.1  Goals: She has chronic anxieties, mostly related to health concerns and her son's well-being. Would like to develop strategies to manage symptoms of anxiety. Needs to understand enmeshment and learn to define and manage appropriate family boundaries. Target date is 12-23. Patient has realized significant improvement in recognizing and establishing appropriate boundaries. Anxiety about health and family matters persist and she desires to continue treatment in an effort to manage and reduce these feelings. Revised target date is 12-24.  Meds: Xanax (.25 mg);   Patient agrees to a video session and is aware of the limitations of this platform. She is at home and I am in my home office.   [name] says that the conflict in her home with her husband's niece has resolved. She is out of the house and is currently stable and not causing problems. [name] says that she  is a little less stressed due to this change and she is also gratified that her son is slightly improved in how he is approaching his professional  life. She does express concerns about his financial stability and his work life Scientist, water quality.  [name] went on a date yesterday which he said was marginal and had been on a date prior. He has plans for another date this coming weekend. This is a significant improvement for him, which is very gratifying for [name]. Her anxiety is feeling a little more manageable and she expresses more control than she previously felt.                                                                            [name], PhD 10:40a-11:30a 50 min

## 2023-03-03 ENCOUNTER — Ambulatory Visit (INDEPENDENT_AMBULATORY_CARE_PROVIDER_SITE_OTHER): Payer: BC Managed Care – PPO

## 2023-03-03 DIAGNOSIS — Z23 Encounter for immunization: Secondary | ICD-10-CM | POA: Diagnosis not present

## 2023-03-03 NOTE — Progress Notes (Signed)
Per orders of Dr. Fabian Sharp, injection of Flu  given by Stann Ore. Patient tolerated injection well.

## 2023-03-09 ENCOUNTER — Ambulatory Visit: Payer: BC Managed Care – PPO | Admitting: Psychology

## 2023-03-12 ENCOUNTER — Other Ambulatory Visit: Payer: Self-pay | Admitting: Rheumatology

## 2023-03-12 NOTE — Telephone Encounter (Signed)
Last Fill: 12/09/2022  Labs: 12/09/2022 CBC and CMP normal.   Next Visit: 04/23/2023  Last Visit: 12/09/2022  DX: Age related osteoporosis, unspecified pathological fracture presence   Current Dose per office note 12/09/2022: not discussed  Okay to refill Fosamax?

## 2023-03-17 ENCOUNTER — Other Ambulatory Visit: Payer: Self-pay | Admitting: Rheumatology

## 2023-03-17 ENCOUNTER — Other Ambulatory Visit: Payer: Self-pay | Admitting: *Deleted

## 2023-03-17 DIAGNOSIS — Z79899 Other long term (current) drug therapy: Secondary | ICD-10-CM

## 2023-03-17 DIAGNOSIS — M3500 Sicca syndrome, unspecified: Secondary | ICD-10-CM

## 2023-03-17 DIAGNOSIS — M0609 Rheumatoid arthritis without rheumatoid factor, multiple sites: Secondary | ICD-10-CM

## 2023-03-17 MED ORDER — HYDROXYCHLOROQUINE SULFATE 200 MG PO TABS: ORAL_TABLET | ORAL | 0 refills | Status: DC

## 2023-03-17 NOTE — Telephone Encounter (Signed)
Last Fill: 12/09/2022  Eye exam: 02/26/2023 WNL   Labs: 12/09/2022, I called patient to advise labs are due.  Next Visit: 04/23/2023  Last Visit: 12/09/2022  [id number] arthritis of multiple sites with negative rheumatoid factor   Current Dose per office note 12/09/2022: Plaquenil 200 mg by mouth twice daily Monday to Friday   Okay to refill Plaquenil?

## 2023-03-17 NOTE — Telephone Encounter (Signed)
Last Fill: 12/09/2022   Labs: 12/09/2022  CBC and CMP normal. I called patient, labs are due.  Next Visit: 04/23/2023  Last Visit: 12/09/2022  DX: Rheumatoid arthritis of multiple sites with negative rheumatoid factor   Current Dose per office note 12/09/2022: Methotrexate 8 tablets by mouth every week  Okay to refill Methotrexate?

## 2023-03-23 ENCOUNTER — Ambulatory Visit: Payer: BC Managed Care – PPO | Admitting: Psychology

## 2023-03-23 DIAGNOSIS — F411 Generalized anxiety disorder: Secondary | ICD-10-CM

## 2023-03-23 DIAGNOSIS — F4322 Adjustment disorder with anxiety: Secondary | ICD-10-CM

## 2023-03-23 NOTE — Progress Notes (Signed)
03/23/2023  Treatrment Plan:  Diagnosis F41.1 (Generalized anxiety disorder) [n/a]  Z62.820 (Parent child relationship problem) [n/a]  Symptoms Excessive and/or unrealistic worry that is difficult to control occurring more days than not for at least 6 months about a number of events or activities. (Status: maintained) -- No Description Entered  Regularly overindulges their child's wishes and demands. (Status: maintained) -- No Description Entered  Medication Status compliance  Safety none  If Suicidal or Homicidal State Action Taken: unspecified  Current Risk: low Medications unspecified Objectives Related Problem: Achieve a level of competent, effective parenting. Description: Increase the gradual letting go of their adolescent in constructive, affirmative ways. Target Date: 2023-05-15 Frequency: Daily Modality: individual Progress: 80%  Related Problem: Achieve a level of competent, effective parenting. Description: Identify unresolved childhood issues that affect parenting and work toward their resolution. Target Date: 2023-05-15 Frequency: Daily Modality: individual Progress: 80%  Related Problem: Achieve a level of competent, effective parenting. Description: Freely express feelings of frustration, helplessness, and inadequacy that each experiences in the parenting role. Target Date: 2023-05-15 Frequency: Daily Modality: individual Progress: 80%  Related Problem: Resolve the core conflict that is the source of anxiety. Description: Learn and implement problem-solving strategies for realistically addressing worries. Target Date: 2023-05-15 Frequency: Daily Modality: individual Progress: 75%  Related Problem: Resolve the core conflict that is the source of  anxiety. Description: Learn and implement calming skills to reduce overall anxiety and manage anxiety symptoms. Target Date: 2023-05-15 Frequency: Daily Modality: individual Progress: 70%  Related Problem: Resolve the core conflict that is the source of anxiety. Description: Describe situations, thoughts, feelings, and actions associated with anxieties and worries, their impact on functioning, and attempts to resolve them. Target Date: 2023-05-15 Frequency: Daily Modality: individual Progress: 70%  Client Response full compliance  Service Location Location, [address] [place], [place] [address]  Service Code cpt [phone]  Behavioral activation plan  Facilitate problem solving  Identify automatic thoughts  Emotion regulation skills  Provide education, information  Self care activities  Relaxation training  Lifestyle change (exercise, nutrition)  Self-monitoring  Validate/empathize  Session notes: F 41.1  Goals: She has chronic anxieties, mostly related to health concerns and her son's well-being. Would like to develop strategies to manage symptoms of anxiety. Needs to understand enmeshment and learn to define and manage appropriate family boundaries. Target date is 12-23. Patient has realized significant improvement in recognizing and establishing appropriate boundaries. Anxiety about health and family matters persist and she desires to continue treatment in an effort to manage and reduce these feelings. Revised target date is 12-24.  Meds: Xanax (.25 mg);   Patient agrees to a video session and is aware of the limitations of this platform. She is at home and I am in my home office.   [name] lost her brother a couple of weeks ago. He had been sick but all were surprised  because he was stable. He was 74 and she felt close to him. He lived in [place] but was visiting [place] when he passed. [name] joined in session. He and [name] talked about his transition into his new job.  [name] is replicating his previous work habits, which is distressing to both of them. She feels that [name] is wasting his time and money. He says he is still trying to date on line. Problem is that he isn't attracted to the people he is matching. Those he is interested in are not expressing an interest in him. We talked about ways to expand his social network. Poetry says that things are more stable at home lately and she is less agitated. She has a lot of anxiety about world events, particularly as an immigrant.                                                                               [name], PhD 10:40a-11:30a 50 min

## 2023-04-01 ENCOUNTER — Encounter: Payer: Self-pay | Admitting: Rheumatology

## 2023-04-01 ENCOUNTER — Ambulatory Visit (INDEPENDENT_AMBULATORY_CARE_PROVIDER_SITE_OTHER): Payer: BC Managed Care – PPO

## 2023-04-01 DIAGNOSIS — Z7901 Long term (current) use of anticoagulants: Secondary | ICD-10-CM | POA: Diagnosis not present

## 2023-04-01 DIAGNOSIS — Z79899 Other long term (current) drug therapy: Secondary | ICD-10-CM

## 2023-04-01 DIAGNOSIS — M0609 Rheumatoid arthritis without rheumatoid factor, multiple sites: Secondary | ICD-10-CM

## 2023-04-01 LAB — POCT INR: INR: 1.7 — AB (ref 2.0–3.0)

## 2023-04-01 NOTE — Patient Instructions (Addendum)
Pre visit review using our clinic review tool, if applicable. No additional management support is needed unless otherwise documented below in the visit note.  Continue 5 mg (2 tablets) daily except take 2.5 mg (1 tablet) on Sundays and Thursdays and take 3.75 mg (1 1/2 tablets) on Tuesdays. Recheck in 5 weeks.

## 2023-04-01 NOTE — Progress Notes (Signed)
Continue 5 mg (2 tablets) daily except take 2.5 mg (1 tablet) on Sundays and Thursdays and take 3.75 mg (1 1/2 tablets) on Tuesdays. Recheck in 5 weeks.

## 2023-04-02 NOTE — Telephone Encounter (Signed)
Okay to add results per patient's request.

## 2023-04-06 ENCOUNTER — Other Ambulatory Visit: Payer: Self-pay | Admitting: *Deleted

## 2023-04-06 ENCOUNTER — Ambulatory Visit: Payer: BC Managed Care – PPO | Admitting: Psychology

## 2023-04-06 DIAGNOSIS — M0609 Rheumatoid arthritis without rheumatoid factor, multiple sites: Secondary | ICD-10-CM

## 2023-04-06 DIAGNOSIS — Z79899 Other long term (current) drug therapy: Secondary | ICD-10-CM

## 2023-04-06 DIAGNOSIS — M3500 Sicca syndrome, unspecified: Secondary | ICD-10-CM

## 2023-04-07 LAB — SEDIMENTATION RATE: Sed Rate: 11 mm/h (ref 0–30)

## 2023-04-07 LAB — CBC WITH DIFFERENTIAL/PLATELET
Absolute Lymphocytes: 2644 {cells}/uL (ref 850–3900)
Absolute Monocytes: 718 {cells}/uL (ref 200–950)
Basophils Absolute: 47 {cells}/uL (ref 0–200)
Basophils Relative: 0.6 %
Eosinophils Absolute: 117 {cells}/uL (ref 15–500)
Eosinophils Relative: 1.5 %
HCT: 43.4 % (ref 35.0–45.0)
Hemoglobin: 14.1 g/dL (ref 11.7–15.5)
MCH: 27.3 pg (ref 27.0–33.0)
MCHC: 32.5 g/dL (ref 32.0–36.0)
MCV: 84.1 fL (ref 80.0–100.0)
MPV: 12.2 fL (ref 7.5–12.5)
Monocytes Relative: 9.2 %
Neutro Abs: 4274 {cells}/uL (ref 1500–7800)
Neutrophils Relative %: 54.8 %
Platelets: 248 10*3/uL (ref 140–400)
RBC: 5.16 10*6/uL — ABNORMAL HIGH (ref 3.80–5.10)
RDW: 13.2 % (ref 11.0–15.0)
Total Lymphocyte: 33.9 %
WBC: 7.8 10*3/uL (ref 3.8–10.8)

## 2023-04-07 LAB — C-REACTIVE PROTEIN: CRP: 9.8 mg/L — ABNORMAL HIGH (ref <8.0)

## 2023-04-07 LAB — COMPLETE METABOLIC PANEL WITH GFR
AG Ratio: 1.4 (calc) (ref 1.0–2.5)
ALT: 12 U/L (ref 6–29)
AST: 17 U/L (ref 10–35)
Albumin: 3.9 g/dL (ref 3.6–5.1)
Alkaline phosphatase (APISO): 80 U/L (ref 37–153)
BUN: 10 mg/dL (ref 7–25)
CO2: 27 mmol/L (ref 20–32)
Calcium: 8.9 mg/dL (ref 8.6–10.4)
Chloride: 106 mmol/L (ref 98–110)
Creat: 0.8 mg/dL (ref 0.50–1.05)
Globulin: 2.8 g/dL (ref 1.9–3.7)
Glucose, Bld: 98 mg/dL (ref 65–99)
Potassium: 4.5 mmol/L (ref 3.5–5.3)
Sodium: 141 mmol/L (ref 135–146)
Total Bilirubin: 0.6 mg/dL (ref 0.2–1.2)
Total Protein: 6.7 g/dL (ref 6.1–8.1)
eGFR: 82 mL/min/{1.73_m2} (ref >=60)

## 2023-04-07 LAB — RHEUMATOID FACTOR: Rheumatoid fact SerPl-aCnc: 36 [IU]/mL — ABNORMAL HIGH (ref <14)

## 2023-04-07 NOTE — Progress Notes (Signed)
CMP WNL  RBC count is borderline elevated. Rest of CBC WNL.  We will continue to monitor.

## 2023-04-07 NOTE — Progress Notes (Signed)
Sed rate normal, CRP is mildly elevated, rheumatoid factor is elevated and stable.  We will discuss results at the follow-up visit.

## 2023-04-08 ENCOUNTER — Other Ambulatory Visit: Payer: Self-pay | Admitting: Urology

## 2023-04-08 ENCOUNTER — Ambulatory Visit: Payer: BC Managed Care – PPO

## 2023-04-08 DIAGNOSIS — N281 Cyst of kidney, acquired: Secondary | ICD-10-CM

## 2023-04-14 NOTE — Progress Notes (Signed)
Office Visit Note  Patient: [name]             Date of Birth: 1960-09-16           MRN: [id number]             PCP: [name], MD Referring: [name], MD Visit Date: 04/23/2023 Occupation: @GUAROCC @  Subjective:  Intermittent pain in joints  History of Present Illness: [name] is a 62 y.o. female with seronegative rheumatoid arthritis, osteoarthritis, degenerative disc disease and osteoporosis.  Patient states that she has intermittent pain and discomfort in her shoulders, hands, knees and her feet.  She has not noticed any joint swelling.  She states that shoulder joint pain improved after the last visit.  She decided not to go for physical therapy.  Although she would like to take some exercises to do at home.  She continues to have discomfort in her lower back.  She states that she has been taking Lyrica her symptoms have improved.  She has been taking Fosamax 70 mg p.o. weekly since January 2022.  She is also on hydroxychloroquine 200 mg p.o. twice daily Monday to Friday and methotrexate 8 tablets p.o. weekly along with folic acid 2 mg p.o. daily.  She denies any side effects or interruption in the treatment.    Activities of Daily Living:  Patient reports morning stiffness for 20-30 minutes.   Patient Denies nocturnal pain.  Difficulty dressing/grooming: Denies Difficulty climbing stairs: Denies Difficulty getting out of chair: Denies Difficulty using hands for taps, buttons, cutlery, and/or writing: Denies  Review of Systems  Constitutional:  Positive for fatigue.  HENT:  Positive for mouth dryness. Negative for mouth sores.   Eyes:  Positive for dryness.  Respiratory:  Negative for shortness of breath.   Cardiovascular:  Negative for chest pain and palpitations.  Gastrointestinal:  Positive for constipation. Negative for blood in stool and diarrhea.  Endocrine: Negative for increased urination.  Genitourinary:  Negative for involuntary  urination.  Musculoskeletal:  Positive for joint pain, joint pain, joint swelling, myalgias, morning stiffness, muscle tenderness and myalgias. Negative for muscle weakness.  Skin:  Positive for hair loss and sensitivity to sunlight. Negative for color change and rash.  Allergic/Immunologic: Positive for susceptible to infections.  Neurological:  Positive for dizziness. Negative for headaches.  Hematological:  Negative for swollen glands.  Psychiatric/Behavioral:  Negative for depressed mood and sleep disturbance. The patient is nervous/anxious.     PMFS History:  Patient Active Problem List   Diagnosis Date Noted   Lumbosacral radiculopathy at L5 10/08/2022   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HCC) 01/06/2018   Long term (current) use of anticoagulants 02/11/2017   Sicca syndrome (HCC) 12/22/2016   ANA positive 12/22/2016   Raised intraocular pressure of both eyes 12/22/2016   Migraine 03/31/2016   Anxiety 03/31/2016   High risk medication use 03/31/2016   Primary osteoarthritis of both feet 03/31/2016   Primary osteoarthritis of both knees 03/31/2016   Stiffness of left hand joint 11/27/2014   Elevated IOP 06/09/2014   Long term current use of systemic steroids 06/09/2014   Medically complex patient 06/09/2014   Current use of steroid medication 01/09/2014   Xerosis of skin 12/07/2013   Encounter for therapeutic drug monitoring 06/09/2013   B12 deficiency 05/31/2013   Iron deficiency 05/31/2013   Visit for preventive health examination 05/25/2012   Mammogram abnormal 04/12/2012   Iliac crest  pain 01/23/2012   Vaginal atrophy 01/23/2012  History of recurrent UTI (urinary tract infection) 01/23/2012   Osteoporosis 04/29/2011   Nonspecific abnormal results of liver function study 12/08/2010   Chronic anticoagulation    Anticoagulant long-term use 07/15/2010   Anticardiolipin antibody positive 07/15/2010   Chronic cough 03/19/2009   CHEST PAIN-UNSPECIFIED 08/26/2008    PALPITATIONS, RECURRENT 07/24/2008   Vitamin D deficiency 05/30/2008   HYPERLIPIDEMIA 05/30/2008   IRON DEFICIENCY 05/30/2008   Adjustment disorder with anxiety 05/30/2008   Rheumatoid arthritis (HCC) 05/30/2008   PULMONARY EMBOLISM, HX OF 05/30/2008    Past Medical History:  Diagnosis Date   Adjustment disorder with anxiety    B12 deficiency due to diet    is a vegetarian   Chest pain, unspecified    Chronic anticoagulation    Fatty liver    Per patient   GERD (gastroesophageal reflux disease)    Headache(784.0)    migraines and head pain   Hx of varicella    As Child   Iron deficiency anemia, unspecified    Otalgia, unspecified    Other and unspecified hyperlipidemia    Palpitations    Personal history of venous thrombosis and embolism    Recurrent UTI    Rheumatoid arthritis(714.0)    deveshewar   Unspecified vitamin D deficiency    UTI (lower urinary tract infection) 11/28/2011   citrobacter       Family History  Problem Relation Age of Onset   Parkinsonism Mother    Dementia Father    Stroke Father    Hyperlipidemia Father    Diabetes Father    Lymphoma Other    Colon cancer Neg Hx    Colon polyps Neg Hx    Esophageal cancer Neg Hx    Stomach cancer Neg Hx    Rectal cancer Neg Hx    Breast cancer Neg Hx    Past Surgical History:  Procedure Laterality Date   ORIF ULNAR FRACTURE     TYMPANOSTOMY TUBE PLACEMENT  2009   Right   Social History   Social History Narrative   Left handed   Caffeine use: Tea daily, coffee once a week   Regular Exercise-no20 yrs in [place] at [employer] [place].Married HH  of 3 G1P1   Immunization History  Administered Date(s) Administered   Influenza, Seasonal, Injecte, Preservative Fre 03/03/2023   Influenza,inj,Quad PF,6+ Mos 04/19/2013, 06/27/2015, 03/05/2016, 02/11/2017, 03/15/2018, 02/23/2019, 04/02/2020, 03/13/2021, 02/26/2022   PFIZER(Purple Top)SARS-COV-2 Vaccination 08/06/2019, 08/25/2019, 01/12/2020, 11/10/2020    PPD Test 07/21/2016   Tdap 05/31/2013   Zoster Recombinant(Shingrix) 01/23/2021, 06/11/2021     Objective: Vital Signs: BP 118/76 (BP Location: Right Arm, Patient Position: Sitting, Cuff Size: Normal)   Pulse (!) 54   Resp 15   Ht 5\' 3"  (1.6 m)   Wt 161 lb 3.2 oz (73.1 kg)   BMI 28.56 kg/m    Physical Exam Vitals and nursing note reviewed.  Constitutional:      Appearance: She is well-developed.  HENT:     Head: Normocephalic and atraumatic.  Eyes:     Conjunctiva/sclera: Conjunctivae normal.  Cardiovascular:     Rate and Rhythm: Normal rate and regular rhythm.     Heart sounds: Normal heart sounds.  Pulmonary:     Effort: Pulmonary effort is normal.     Breath sounds: Normal breath sounds.  Abdominal:     General: Bowel sounds are normal.     Palpations: Abdomen is soft.  Musculoskeletal:     Cervical back: Normal range of motion.  Lymphadenopathy:  Cervical: No cervical adenopathy.  Skin:    General: Skin is warm and dry.     Capillary Refill: Capillary refill takes less than 2 seconds.  Neurological:     Mental Status: She is alert and oriented to person, place, and time.  Psychiatric:        Behavior: Behavior normal.     Musculoskeletal Exam: She good range of motion of the cervical spine.  Thoracic kyphosis was noted without any discomfort.  She had discomfort range of motion of her lumbar spine.  Shoulders, elbows, wrist joints, MCPs PIPs and DIPs were in good range of motion with no synovitis.  Bilateral PIP and DIP thickening was noted.  Hip joints and knee joints were in good range of motion without any warmth swelling or effusion.  There was no tenderness over ankles or MTPs.  She has some tenderness over left posterior calcaneal spur.  CDAI Exam: CDAI Score: -- Patient Global: 20 / 100; Provider Global: 20 / 100 Swollen: --; Tender: -- Joint Exam 04/23/2023   No joint exam has been documented for this visit   There is currently no information  documented on the homunculus. Go to the Rheumatology activity and complete the homunculus joint exam.  Investigation: No additional findings.  Imaging: No results found.  Recent Labs: Lab Results  Component Value Date   WBC 7.8 04/06/2023   HGB 14.1 04/06/2023   PLT 248 04/06/2023   NA 141 04/06/2023   K 4.5 04/06/2023   CL 106 04/06/2023   CO2 27 04/06/2023   GLUCOSE 98 04/06/2023   BUN 10 04/06/2023   CREATININE 0.80 04/06/2023   BILITOT 0.6 04/06/2023   ALKPHOS 71 08/25/2022   AST 17 04/06/2023   ALT 12 04/06/2023   PROT 6.7 04/06/2023   ALBUMIN 4.2 08/25/2022   CALCIUM 8.9 04/06/2023   GFRAA 96 07/26/2020   QFTBGOLDPLUS NEGATIVE 09/12/2020    Speciality Comments: PLQ Eye Exam: 02/26/2023 WNL @ [facility] Follow up 1 year Ttd with Fosamax for 5 years in the past Fosamax restarted 05/2020  Procedures:  No procedures performed Allergies: Boniva [ibandronate sodium], Ivp dye [iodinated contrast media], Other, Risedronate sodium, Risedronate sodium [risedronate sodium], and Macrobid [nitrofurantoin]   Assessment / Plan:     Visit Diagnoses: Rheumatoid arthritis of multiple sites with negative rheumatoid factor (HCC) - RF negative, anti-CCP positive, ANA positive.  Patient had no synovitis on the examination today.  She gives history of intermittent joint discomfort.  She denies any interruption in the treatment.  She had been taking hydroxychloroquine and methotrexate on a regular basis.  Labs obtained on April 06, 2023 were reviewed.  Her rheumatoid factor remains positive, sed rate was normal and CRP was mildly elevated.  High risk medication use - Plaquenil 200 mg by mouth twice daily Monday to Friday, Methotrexate 8 tablets by mouth every week, and folic acid 2 mg by mouth daily. PLQ Eye Exam: 02/26/2023.  Labs obtained on April 06, 2023 CBC and CMP were normal.    Sicca syndrome (HCC)-over-the-counter products were discussed.  Primary osteoarthritis  of both knees -she gives history of intermittent discomfort in her knee joints.  No warmth swelling or effusion was noted.  X-rays on Sep 26, 2021 showed bilateral moderate osteoarthritis and moderate chondromalacia patella.  Primary osteoarthritis of both feet-she has intermittent discomfort in her feet.  No synovitis was noted.  Chronic pain of both shoulders-shoulder joint discomfort is improved.  Per patient's request I will give her  a handout on shoulder joint exercises.  Spondylosis of lumbar spine -she is not in discomfort in her lower back.  MRI from [facility]--multilevel spondylosis and facet joint arthropathy.  Patient has been seeing Dr. [name], Dr. [name] and she has seen Dr. Shon Baton in the past.  Age related osteoporosis, unspecified pathological fracture presence - 02/02/2020 T score -2.8,BMD 0.649 g/cm2. 08/11/22:The BMD measured at Femur Neck Left is 0.656 g/cm2 with a T-score of -2.7.She restarted Fosamax in January 2022.  We had a detailed discussion regarding stopping Fosamax in January 2025.  That we will give her 3 continuous years on Fosamax.  Patient wants to pay for her next DEXA scan and get a DEXA scan in March 2025.  She will discuss that with her PCP.  We discussed possible use of anabolic agents if her BMD does not improve.  Fatty liver-LFTs are normal.  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  History of migraine  History of hyperlipidemia  Frequent UTI  Orders: No orders of the defined types were placed in this encounter.  No orders of the defined types were placed in this encounter.   Follow-Up Instructions: Return in about 4 months (around 08/22/2023) for Rheumatoid arthritis, Osteoarthritis.   Pollyann Savoy, MD  Note - This record has been created using Animal nutritionist.  Chart creation errors have been sought, but may not always  have been  located. Such creation errors do not reflect on  the standard of medical care.

## 2023-04-20 ENCOUNTER — Ambulatory Visit: Payer: BC Managed Care – PPO | Admitting: Psychology

## 2023-04-20 ENCOUNTER — Other Ambulatory Visit: Payer: Self-pay | Admitting: Neurology

## 2023-04-21 NOTE — Telephone Encounter (Signed)
Last seen on 10/08/22 Follow up scheduled on 07/13/22 Last filled on 03/17/23 #90 tablets (30 day supply) Rx pending to be signed

## 2023-04-23 ENCOUNTER — Ambulatory Visit: Payer: BC Managed Care – PPO | Attending: Rheumatology | Admitting: Rheumatology

## 2023-04-23 ENCOUNTER — Encounter: Payer: Self-pay | Admitting: Rheumatology

## 2023-04-23 VITALS — BP 118/76 | HR 54 | Resp 15 | Ht 63.0 in | Wt 161.2 lb

## 2023-04-23 DIAGNOSIS — M25511 Pain in right shoulder: Secondary | ICD-10-CM | POA: Diagnosis not present

## 2023-04-23 DIAGNOSIS — Z8639 Personal history of other endocrine, nutritional and metabolic disease: Secondary | ICD-10-CM

## 2023-04-23 DIAGNOSIS — H40053 Ocular hypertension, bilateral: Secondary | ICD-10-CM

## 2023-04-23 DIAGNOSIS — M47816 Spondylosis without myelopathy or radiculopathy, lumbar region: Secondary | ICD-10-CM

## 2023-04-23 DIAGNOSIS — M19072 Primary osteoarthritis, left ankle and foot: Secondary | ICD-10-CM

## 2023-04-23 DIAGNOSIS — M810 Age-related osteoporosis without current pathological fracture: Secondary | ICD-10-CM

## 2023-04-23 DIAGNOSIS — M3500 Sicca syndrome, unspecified: Secondary | ICD-10-CM

## 2023-04-23 DIAGNOSIS — Z86711 Personal history of pulmonary embolism: Secondary | ICD-10-CM

## 2023-04-23 DIAGNOSIS — M0609 Rheumatoid arthritis without rheumatoid factor, multiple sites: Secondary | ICD-10-CM

## 2023-04-23 DIAGNOSIS — Z79899 Other long term (current) drug therapy: Secondary | ICD-10-CM | POA: Diagnosis not present

## 2023-04-23 DIAGNOSIS — M170 Bilateral primary osteoarthritis of knee: Secondary | ICD-10-CM

## 2023-04-23 DIAGNOSIS — Z8669 Personal history of other diseases of the nervous system and sense organs: Secondary | ICD-10-CM

## 2023-04-23 DIAGNOSIS — M25512 Pain in left shoulder: Secondary | ICD-10-CM

## 2023-04-23 DIAGNOSIS — M19071 Primary osteoarthritis, right ankle and foot: Secondary | ICD-10-CM

## 2023-04-23 DIAGNOSIS — K760 Fatty (change of) liver, not elsewhere classified: Secondary | ICD-10-CM

## 2023-04-23 DIAGNOSIS — N390 Urinary tract infection, site not specified: Secondary | ICD-10-CM

## 2023-04-23 DIAGNOSIS — G8929 Other chronic pain: Secondary | ICD-10-CM

## 2023-04-23 NOTE — Patient Instructions (Signed)
Standing Labs We placed an order today for your standing lab work.   Please have your standing labs drawn in February and every 3 months  Please have your labs drawn 2 weeks prior to your appointment so that the provider can discuss your lab results at your appointment, if possible.  Please note that you may see your imaging and lab results in MyChart before we have reviewed them. We will contact you once all results are reviewed. Please allow our office up to 72 hours to thoroughly review all of the results before contacting the office for clarification of your results.  WALK-IN LAB HOURS  Monday through Thursday from 8:00 am -12:30 pm and 1:00 pm-5:00 pm and Friday from 8:00 am-12:00 pm.  Patients with office visits requiring labs will be seen before walk-in labs.  You may encounter longer than normal wait times. Please allow additional time. Wait times may be shorter on  Monday and Thursday afternoons.  We do not book appointments for walk-in labs. We appreciate your patience and understanding with our staff.   Labs are drawn by [facility]. Please bring your co-pay at the time of your lab draw.  You may receive a bill from [facility] for your lab work.  Please note if you are on Hydroxychloroquine and and an order has been placed for a Hydroxychloroquine level,  you will need to have it drawn 4 hours or more after your last dose.  If you wish to have your labs drawn at another location, please call the office 24 hours in advance so we can fax the orders.  The office is located at [address], [place], [place] [address]   If you have any questions regarding directions or hours of operation,  please call [phone].   As a reminder, please drink plenty of water prior to coming for your lab work. Thanks!   Vaccines You are taking a medication(s) that can suppress your immune system.  The following immunizations are recommended: Flu annually Covid-19  RSV Td/Tdap (tetanus,  diphtheria, pertussis) every 10 years Pneumonia (Prevnar 15 then Pneumovax 23 at least 1 year apart.  Alternatively, can take Prevnar 20 without needing additional dose) Shingrix: 2 doses from 4 weeks to 6 months apart  Please check with your PCP to make sure you are up to date.  If you have signs or symptoms of an infection or start antibiotics: First, call your PCP for workup of your infection. Hold your medication through the infection, until you complete your antibiotics, and until symptoms resolve if you take the following: Injectable medication (Actemra, Benlysta, Cimzia, Cosentyx, Enbrel, Humira, Kevzara, Orencia, Remicade, Simponi, Stelara, Taltz, Tremfya) Methotrexate Leflunomide (Arava) Mycophenolate (Cellcept) Osborne [place], or Rinvoq   Shoulder Exercises Ask your health care provider which exercises are safe for you. Do exercises exactly as told by your health care provider and adjust them as directed. It is normal to feel mild stretching, pulling, tightness, or discomfort as you do these exercises. Stop right away if you feel sudden pain or your pain gets worse. Do not begin these exercises until told by your health care provider. Stretching exercises External rotation and abduction This exercise is sometimes called corner stretch. The exercise rotates your arm outward (external rotation) and moves your arm out from your body (abduction). Stand in a doorway with one of your feet slightly in front of the other. This is called a staggered stance. If you cannot reach your forearms to the door frame, stand facing  a corner of a room. Choose one of the following positions as told by your health care provider: Place your hands and forearms on the door frame above your head. Place your hands and forearms on the door frame at the height of your head. Place your hands on the door frame at the height of your elbows. Slowly move your weight onto your front foot until you feel a stretch  across your chest and in the front of your shoulders. Keep your head and chest upright and keep your abdominal muscles tight. Hold for __________ seconds. To release the stretch, shift your weight to your back foot. Repeat __________ times. Complete this exercise __________ times a day. Extension, standing  Stand and hold a broomstick, a cane, or a similar object behind your back. Your hands should be a little wider than shoulder-width apart. Your palms should face away from your back. Keeping your elbows straight and your shoulder muscles relaxed, move the stick away from your body until you feel a stretch in your shoulders (extension). Avoid shrugging your shoulders while you move the stick. Keep your shoulder blades tucked down toward the middle of your back. Hold for __________ seconds. Slowly return to the starting position. Repeat __________ times. Complete this exercise __________ times a day. Range-of-motion exercises Pendulum  Stand near a wall or a surface that you can hold onto for balance. Bend at the waist and let your left / right arm hang straight down. Use your other arm to support you. Keep your back straight and do not lock your knees. Relax your left / right arm and shoulder muscles, and move your hips and your trunk so your left / right arm swings freely. Your arm should swing because of the motion of your body, not because you are using your arm or shoulder muscles. Keep moving your hips and trunk so your arm swings in the following directions, as told by your health care provider: Side to side. Forward and backward. In clockwise and counterclockwise circles. Continue each motion for __________ seconds, or for as long as told by your health care provider. Slowly return to the starting position. Repeat __________ times. Complete this exercise __________ times a day. Shoulder flexion, standing  Stand and hold a broomstick, a cane, or a similar object. Place your hands  a little more than shoulder-width apart on the object. Your left / right hand should be palm-up, and your other hand should be palm-down. Keep your elbow straight and your shoulder muscles relaxed. Push the stick up with your healthy arm to raise your left / right arm in front of your body, and then over your head until you feel a stretch in your shoulder (flexion). Avoid shrugging your shoulder while you raise your arm. Keep your shoulder blade tucked down toward the middle of your back. Hold for __________ seconds. Slowly return to the starting position. Repeat __________ times. Complete this exercise __________ times a day. Shoulder abduction, standing  Stand and hold a broomstick, a cane, or a similar object. Place your hands a little more than shoulder-width apart on the object. Your left / right hand should be palm-up, and your other hand should be palm-down. Keep your elbow straight and your shoulder muscles relaxed. Push the object across your body toward your left / right side. Raise your left / right arm to the side of your body (abduction) until you feel a stretch in your shoulder. Do not raise your arm above shoulder height unless your  health care provider tells you to do that. If directed, raise your arm over your head. Avoid shrugging your shoulder while you raise your arm. Keep your shoulder blade tucked down toward the middle of your back. Hold for __________ seconds. Slowly return to the starting position. Repeat __________ times. Complete this exercise __________ times a day. Internal rotation  Place your left / right hand behind your back, palm-up. Use your other hand to dangle an exercise band, a broomstick, or a similar object over your shoulder. Grasp the band with your left / right hand so you are holding on to both ends. Gently pull up on the band until you feel a stretch in the front of your left / right shoulder. The movement of your arm toward the center of your body is  called internal rotation. Avoid shrugging your shoulder while you raise your arm. Keep your shoulder blade tucked down toward the middle of your back. Hold for __________ seconds. Release the stretch by letting go of the band and lowering your hands. Repeat __________ times. Complete this exercise __________ times a day. Strengthening exercises External rotation  Sit in a stable chair without armrests. Secure an exercise band to a stable object at elbow height on your left / right side. Place a soft object, such as a folded towel or a small pillow, between your left / right upper arm and your body to move your elbow about 4 inches (10 cm) away from your side. Hold the end of the exercise band so it is tight and there is no slack. Keeping your elbow pressed against the soft object, slowly move your forearm out, away from your abdomen (external rotation). Keep your body steady so only your forearm moves. Hold for __________ seconds. Slowly return to the starting position. Repeat __________ times. Complete this exercise __________ times a day. Shoulder abduction  Sit in a stable chair without armrests, or stand up. Hold a __________ lb / kg weight in your left / right hand, or hold an exercise band with both hands. Start with your arms straight down and your left / right palm facing in, toward your body. Slowly lift your left / right hand out to your side (abduction). Do not lift your hand above shoulder height unless your health care provider tells you that this is safe. Keep your arms straight. Avoid shrugging your shoulder while you do this movement. Keep your shoulder blade tucked down toward the middle of your back. Hold for __________ seconds. Slowly lower your arm, and return to the starting position. Repeat __________ times. Complete this exercise __________ times a day. Shoulder extension  Sit in a stable chair without armrests, or stand up. Secure an exercise band to a stable  object in front of you so it is at shoulder height. Hold one end of the exercise band in each hand. Straighten your elbows and lift your hands up to shoulder height. Squeeze your shoulder blades together as you pull your hands down to the sides of your thighs (extension). Stop when your hands are straight down by your sides. Do not let your hands go behind your body. Hold for __________ seconds. Slowly return to the starting position. Repeat __________ times. Complete this exercise __________ times a day. Shoulder row  Sit in a stable chair without armrests, or stand up. Secure an exercise band to a stable object in front of you so it is at chest height. Hold one end of the exercise band in each hand.  Position your palms so that your thumbs are facing the ceiling (neutral position). Bend each of your elbows to a 90-degree angle (right angle) and keep your upper arms at your sides. Step back or move the chair back until the band is tight and there is no slack. Slowly pull your elbows back behind you. Hold for __________ seconds. Slowly return to the starting position. Repeat __________ times. Complete this exercise __________ times a day. Shoulder press-ups  Sit in a stable chair that has armrests. Sit upright, with your feet flat on the floor. Put your hands on the armrests so your elbows are bent and your fingers are pointing forward. Your hands should be about even with the sides of your body. Push down on the armrests and use your arms to lift yourself off the chair. Straighten your elbows and lift yourself up as much as you comfortably can. Move your shoulder blades down, and avoid letting your shoulders move up toward your ears. Keep your feet on the ground. As you get stronger, your feet should support less of your body weight as you lift yourself up. Hold for __________ seconds. Slowly lower yourself back into the chair. Repeat __________ times. Complete this exercise __________  times a day. Wall push-ups  Stand so you are facing a stable wall. Your feet should be about one arm-length away from the wall. Lean forward and place your palms on the wall at shoulder height. Keep your feet flat on the floor as you bend your elbows and lean forward toward the wall. Hold for __________ seconds. Straighten your elbows to push yourself back to the starting position. Repeat __________ times. Complete this exercise __________ times a day. This information is not intended to replace advice given to you by your health care provider. Make sure you discuss any questions you have with your health care provider. Document Revised: 06/25/2021 Document Reviewed: 06/25/2021 Elsevier Patient Education  2024 [employer].

## 2023-04-27 ENCOUNTER — Other Ambulatory Visit: Payer: BC Managed Care – PPO

## 2023-04-29 ENCOUNTER — Ambulatory Visit
Admission: RE | Admit: 2023-04-29 | Discharge: 2023-04-29 | Disposition: A | Discharge status: Home / Self Care | Payer: BC Managed Care – PPO | Source: Ambulatory Visit | Attending: Urology | Admitting: Urology

## 2023-04-29 DIAGNOSIS — N281 Cyst of kidney, acquired: Secondary | ICD-10-CM

## 2023-04-30 ENCOUNTER — Encounter: Payer: Self-pay | Admitting: Rheumatology

## 2023-05-01 NOTE — Telephone Encounter (Signed)
Okay to see orthopedics or a podiatrist.

## 2023-05-04 ENCOUNTER — Ambulatory Visit: Payer: Self-pay | Admitting: Psychology

## 2023-05-04 DIAGNOSIS — F4322 Adjustment disorder with anxiety: Secondary | ICD-10-CM

## 2023-05-04 NOTE — Progress Notes (Signed)
05/04/2023  Treatrment Plan:  Diagnosis F41.1 (Generalized anxiety disorder) [n/a]  Z62.820 (Parent child relationship problem) [n/a]  Symptoms Excessive and/or unrealistic worry that is difficult to control occurring more days than not for at least 6 months about a number of events or activities. (Status: maintained) -- No Description Entered  Regularly overindulges their child's wishes and demands. (Status: maintained) -- No Description Entered  Medication Status compliance  Safety none  If Suicidal or Homicidal State Action Taken: unspecified  Current Risk: low Medications unspecified Objectives Related Problem: Achieve a level of competent, effective parenting. Description: Increase the gradual letting go of their adolescent in constructive, affirmative ways. Target Date: 2024-05-14 Frequency: Daily Modality: individual Progress: 85%  Related Problem: Achieve a level of competent, effective parenting. Description: Identify unresolved childhood issues that affect parenting and work toward their resolution. Target Date: 2024-05-14 Frequency: Daily Modality: individual Progress: 90%  Related Problem: Achieve a level of competent, effective parenting. Description: Freely express feelings of frustration, helplessness, and inadequacy that each experiences in the parenting role. Target Date: 2024-05-14 Frequency: Daily Modality: individual Progress: 85%  Related Problem: Resolve the core conflict that is the source of anxiety. Description: Learn and implement problem-solving strategies for realistically addressing worries. Target Date: 2024-05-14 Frequency: Daily Modality: individual Progress: 85%  Related Problem: Resolve the core  conflict that is the source of anxiety. Description: Learn and implement calming skills to reduce overall anxiety and manage anxiety symptoms. Target Date: 2024-05-14 Frequency: Daily Modality: individual Progress: 75%  Related Problem: Resolve the core conflict that is the source of anxiety. Description: Describe situations, thoughts, feelings, and actions associated with anxieties and worries, their impact on functioning, and attempts to resolve them. Target Date: 2024-05-14 Frequency: Daily Modality: individual Progress: 75%  Client Response full compliance  Service Location Location, [address] [place], [place] [address]  Service Code cpt [phone]  Behavioral activation plan  Facilitate problem solving  Identify automatic thoughts  Emotion regulation skills  Provide education, information  Self care activities  Relaxation training  Lifestyle change (exercise, nutrition)  Self-monitoring  Validate/empathize  Session notes: F 41.1  Goals: She has chronic anxieties, mostly related to health concerns and her son's well-being. Would like to develop strategies to manage symptoms of anxiety. Needs to understand enmeshment and learn to define and manage appropriate family boundaries. Target date is 12-23. Patient has realized significant improvement in recognizing and establishing appropriate boundaries. Anxiety about health and family matters persist and she desires to continue treatment in an effort to manage and reduce these feelings. Revised target date is 12-25.  Meds: Xanax (.25 mg);   Patient agrees to a video session and is aware of the limitations of this platform. She is at home and I am in my home office.   Timora says  that [name] will come home for 2 weeks during the holiday and she is very excited to see him. She says she had another loss in the family. Her brother recently died and the her sister in law was hit by a motorcycle in [place] and was killed. She was  healthy. Liahona says that she had 2 adult children that also lived in [place]. [name] says he remains isolated from others and it is very "comfortable" with being alone. Discussed efforts to be more social. Also talked of grieving loss of 2 family members.                                                                                   [name], PhD 10:40a-11:30a 50 min

## 2023-05-06 ENCOUNTER — Ambulatory Visit: Payer: Self-pay

## 2023-05-06 DIAGNOSIS — Z7901 Long term (current) use of anticoagulants: Secondary | ICD-10-CM

## 2023-05-06 LAB — POCT INR: INR: 1.6 — AB (ref 2.0–3.0)

## 2023-05-06 NOTE — Patient Instructions (Addendum)
Pre visit review using our clinic review tool, if applicable. No additional management support is needed unless otherwise documented below in the visit note.  Continue 5 mg (2 tablets) daily except take 2.5 mg (1 tablet) on Sundays and Thursdays and take 3.75 mg (1 1/2 tablets) on Tuesdays. Recheck in 4 weeks.

## 2023-05-06 NOTE — Progress Notes (Signed)
Pt reports she was diagnosed with tendinitis in L ankle. She was advised to use voltaren gel and physical therapy that will not start until 05/28/23. She was also advised she need to use a topical dexamethazone before going to PT. She was also prescribed prednisone, tapered dose, starting at 20 mg. She is only to use the prednisone if needed. She will try the voltaren for one week before using prednisone if needed at that time. She was also advised she could use Tagamet for GERD. Advised Tagamet does interact but if she was currently taking GERD medication it has the same interaction and her dosing should be the same. Advised if she started prednisone or Tagamet to have INR checked in 1 week after. Pt verbalized understanding.

## 2023-05-08 IMAGING — MG MM DIGITAL SCREENING BILAT W/ TOMO AND CAD
8 series · 8 of 24 positions shown · non-contrast
Comparison: Previous exam(s).

CLINICAL DATA: Screening.

EXAM:
DIGITAL SCREENING BILATERAL MAMMOGRAM WITH TOMOSYNTHESIS AND CAD
TECHNIQUE: Bilateral screening digital craniocaudal and mediolateral oblique
mammograms were obtained. Bilateral screening digital breast
tomosynthesis was performed. The images were evaluated with
computer-aided detection.

[R MLO synth-2D]
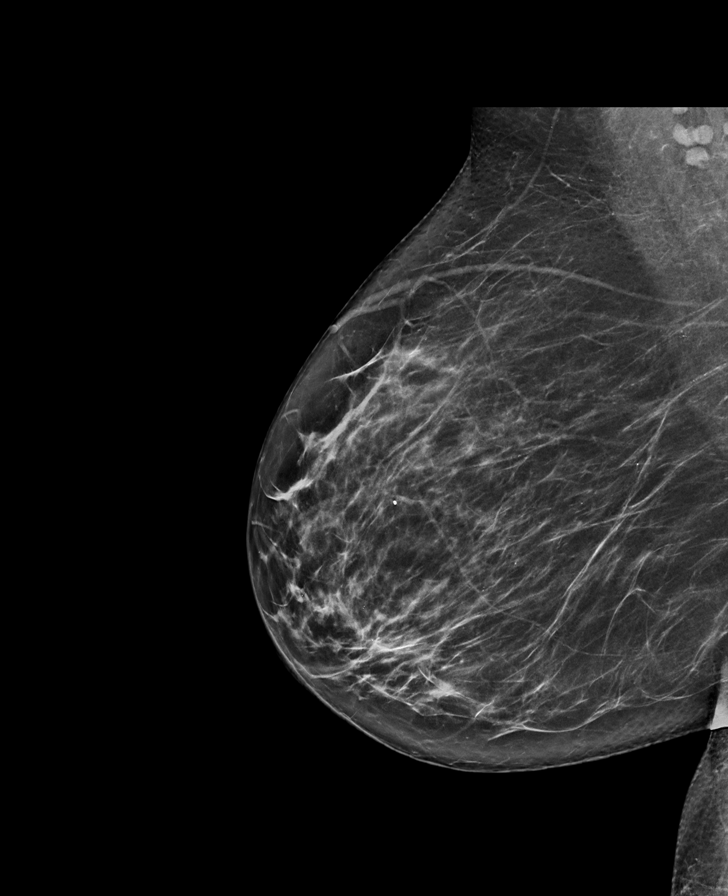

[R CC synth-2D]
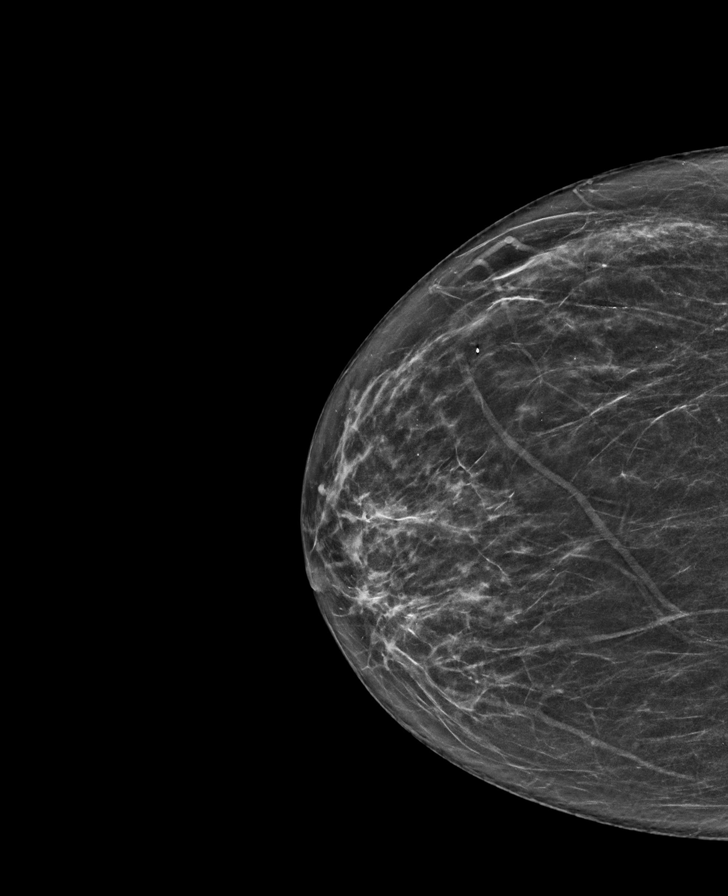

[L MLO synth-2D]
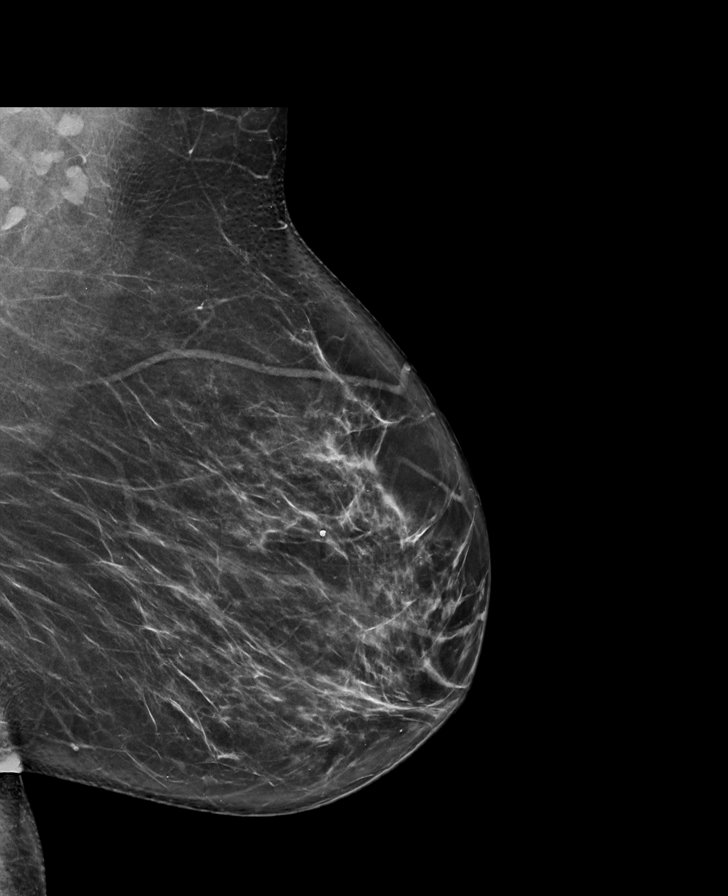

[L CC synth-2D]
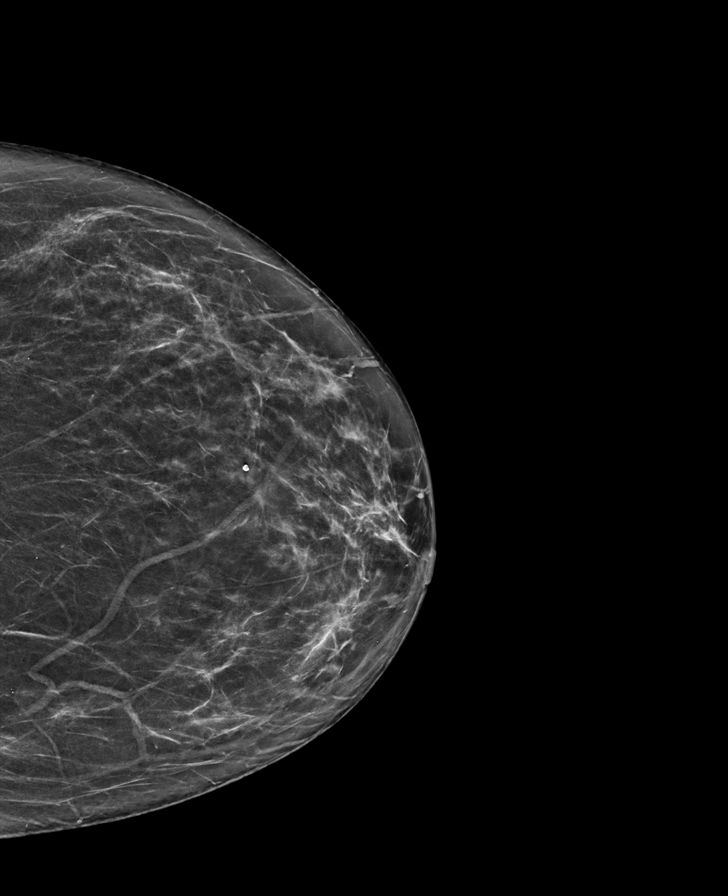

[R CC tomo · tomo slice 32/63.0]
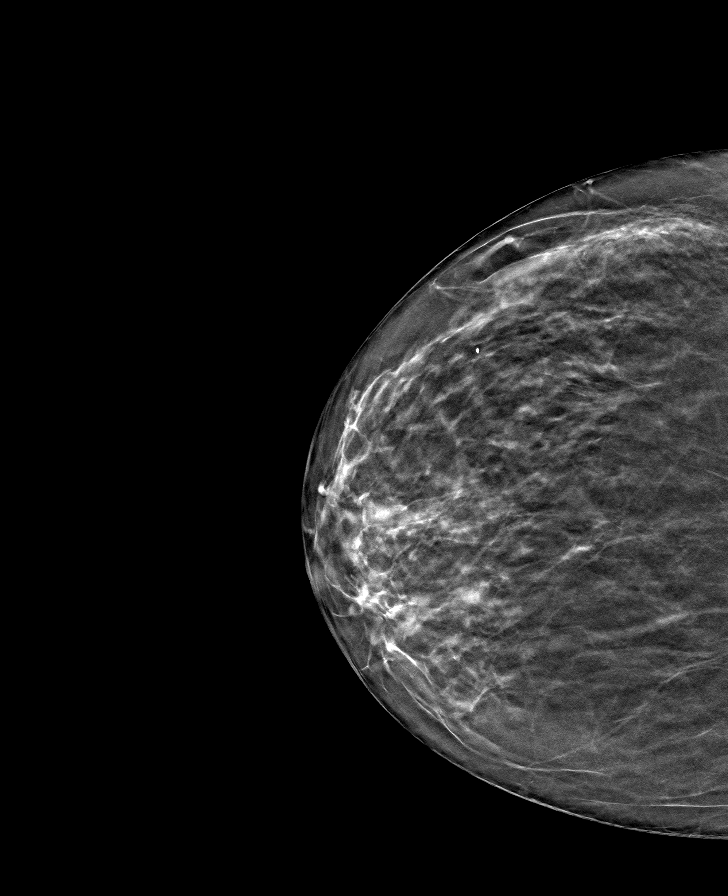

[R MLO tomo · tomo slice 38/75.0]
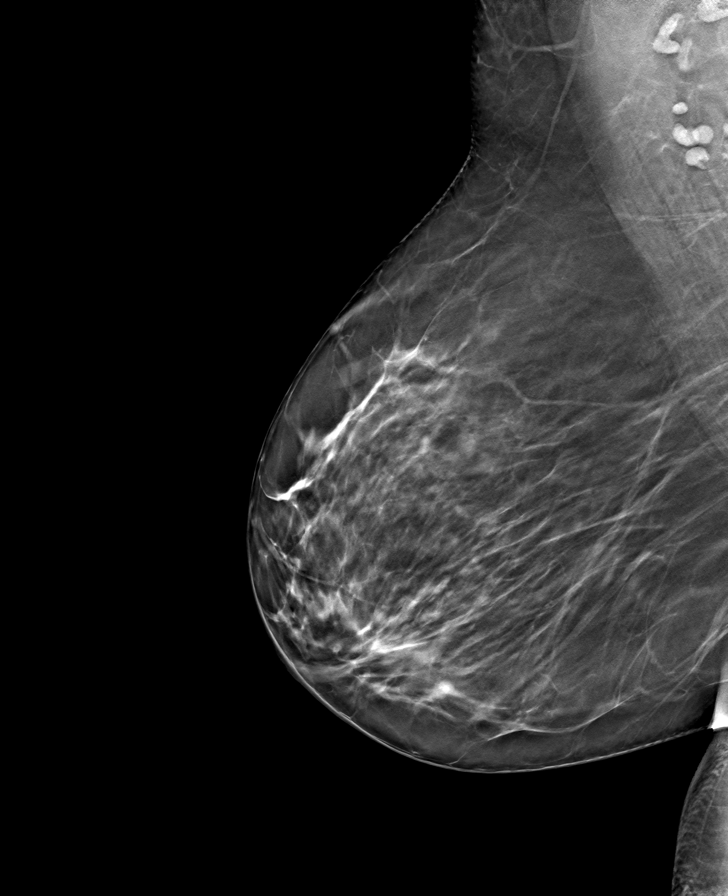

[L MLO tomo · tomo slice 40/79.0]
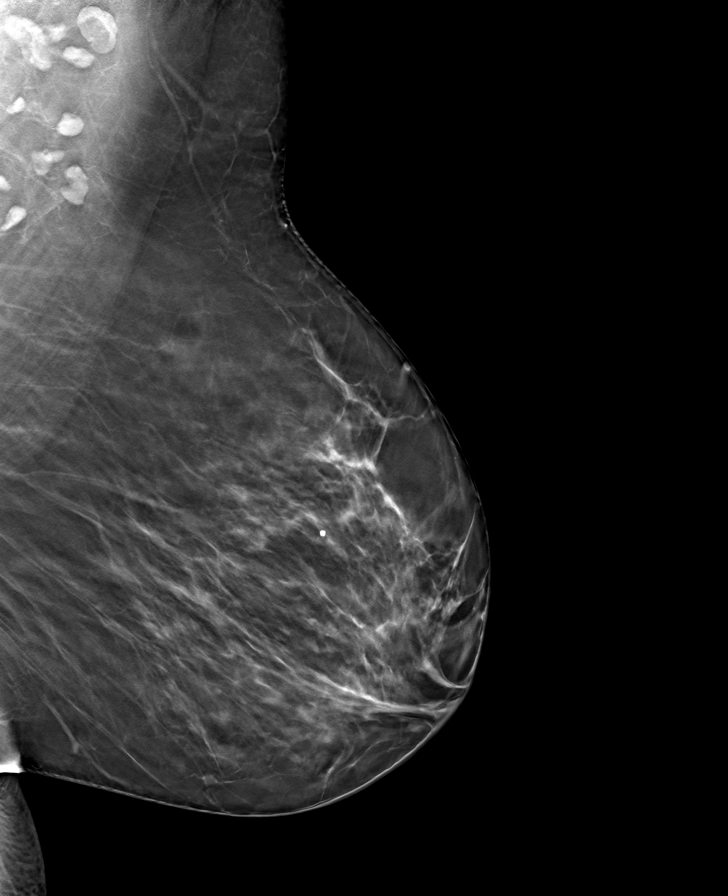

[L CC tomo · tomo slice 35/69.0]
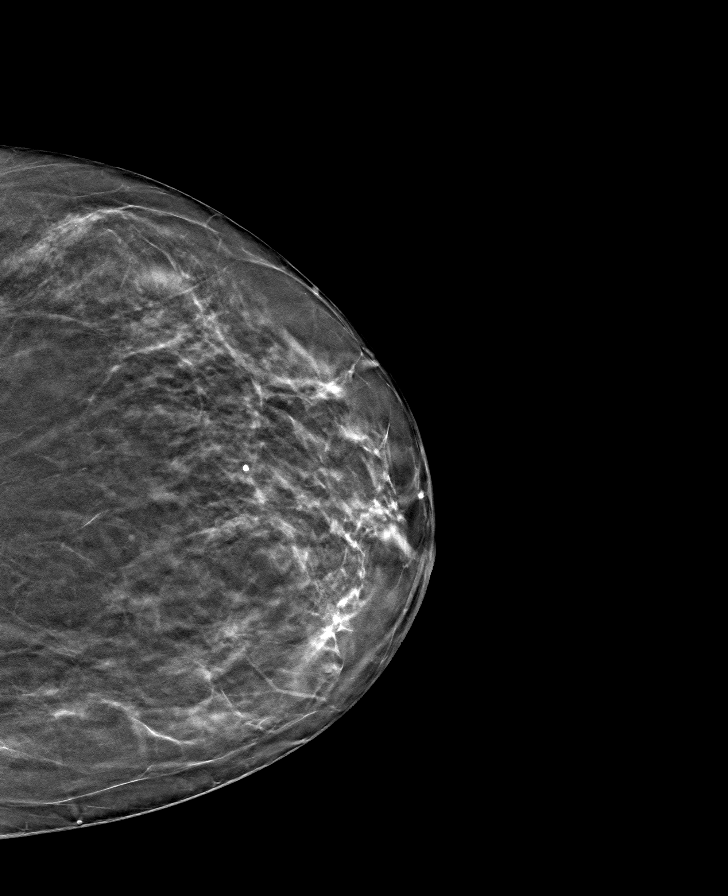

[8 of 24 positions shown; findings below may reference images not displayed]

ACR Breast Density Category b: There are scattered areas of
fibroglandular density.
FINDINGS: There are no findings suspicious for malignancy.
IMPRESSION: No mammographic evidence of malignancy. A result letter of this
screening mammogram will be mailed directly to the patient.

RECOMMENDATION:
Screening mammogram in one year. (Code:[id number])

BI-RADS CATEGORY  1: Negative.

## 2023-05-18 ENCOUNTER — Ambulatory Visit (INDEPENDENT_AMBULATORY_CARE_PROVIDER_SITE_OTHER): Payer: BC Managed Care – PPO | Admitting: Psychology

## 2023-05-18 DIAGNOSIS — F411 Generalized anxiety disorder: Secondary | ICD-10-CM | POA: Diagnosis not present

## 2023-05-18 DIAGNOSIS — F4322 Adjustment disorder with anxiety: Secondary | ICD-10-CM

## 2023-05-18 NOTE — Progress Notes (Signed)
05/18/2023  Treatrment Plan:  Diagnosis F41.1 (Generalized anxiety disorder) [n/a]  Z62.820 (Parent child relationship problem) [n/a]  Symptoms Excessive and/or unrealistic worry that is difficult to control occurring more days than not for at least 6 months about a number of events or activities. (Status: maintained) -- No Description Entered  Regularly overindulges their child's wishes and demands. (Status: maintained) -- No Description Entered  Medication Status compliance  Safety none  If Suicidal or Homicidal State Action Taken: unspecified  Current Risk: low Medications unspecified Objectives Related Problem: Achieve a level of competent, effective parenting. Description: Increase the gradual letting go of their adolescent in constructive, affirmative ways. Target Date: 2024-05-14 Frequency: Daily Modality: individual Progress: 85%  Related Problem: Achieve a level of competent, effective parenting. Description: Identify unresolved childhood issues that affect parenting and work toward their resolution. Target Date: 2024-05-14 Frequency: Daily Modality: individual Progress: 90%  Related Problem: Achieve a level of competent, effective parenting. Description: Freely express feelings of frustration, helplessness, and inadequacy that each experiences in the parenting role. Target Date: 2024-05-14 Frequency: Daily Modality: individual Progress: 85%  Related Problem: Resolve the core conflict that is the source of anxiety. Description: Learn and implement problem-solving strategies for realistically addressing worries. Target Date: 2024-05-14 Frequency: Daily Modality: individual Progress: 85%  Related Problem: Resolve the core conflict that is the source of anxiety. Description: Learn and implement calming skills to reduce overall anxiety and manage anxiety symptoms. Target Date: 2024-05-14 Frequency: Daily Modality: individual Progress:  75%  Related Problem: Resolve the core conflict that is the source of anxiety. Description: Describe situations, thoughts, feelings, and actions associated with anxieties and worries, their impact on functioning, and attempts to resolve them. Target Date: 2024-05-14 Frequency: Daily Modality: individual Progress: 75%  Client Response full compliance  Service Location Location, [address] [place], [place] [address]  Service Code cpt [phone]  Behavioral activation plan  Facilitate problem solving  Identify automatic thoughts  Emotion regulation skills  Provide education, information  Self care activities  Relaxation training  Lifestyle change (exercise, nutrition)  Self-monitoring  Validate/empathize  Session notes: F 41.1  Goals: She has chronic anxieties, mostly related to health concerns and her son's well-being. Would like to develop strategies to manage symptoms of anxiety. Needs to understand enmeshment and learn to define and manage appropriate family boundaries. Target date is 12-23. Patient has realized significant improvement in recognizing and establishing appropriate boundaries. Anxiety about health and family matters persist and she desires to continue treatment in an effort to manage and reduce these feelings. Revised target date is 12-25.  Meds: Xanax (.25 mg);   Patient agrees to a video session and is aware of the limitations of this platform. She is at home and I am in my home office.   [name] says that [name] is visiting for the holiday and will go back to [place] on Saturday. She says it makes her feel better having him home. He has been mostly isolated and it makes her upset. She says that [name] has extreme fatigue. [name] says that he realizes he remains withdrawn even though there are some people whom he feels he reach out. WE talked about making more of an effort to reach out. Feels he is "so far behind" where he wants to be in life. We talked about how to  make a change so that "life doesn't pass by". Suggested that he seek out names of therapists in [place] and send  me link so we can together determine the best "fit".                                                                                     [name], PhD 10:40a-11:30a 50 min

## 2023-05-27 ENCOUNTER — Ambulatory Visit: Payer: Self-pay

## 2023-05-27 DIAGNOSIS — Z7901 Long term (current) use of anticoagulants: Secondary | ICD-10-CM

## 2023-05-27 LAB — POCT INR: INR: 1.3 — AB (ref 2.0–3.0)

## 2023-05-27 NOTE — Patient Instructions (Addendum)
 Pre visit review using our clinic review tool, if applicable. No additional management support is needed unless otherwise documented below in the visit note.  Increase dose today to take 6.25 mg (2 1/2 tablets) today and then continue 5 mg (2 tablets) daily except take 2.5 mg (1 tablet) on Sundays and Thursdays and take 3.75 mg (1 1/2 tablets) on Tuesdays. Recheck in 3 weeks.

## 2023-05-27 NOTE — Progress Notes (Addendum)
 Pt reports she was diagnosed with tendinitis in L ankle. She was advised to use voltaren gel and physical therapy is scheduled for tomorrow5. She was also advised she need to use a topical dexamethazone before going to PT. She was also prescribed prednisone , tapered dose, starting at 20 mg. She is only to use the prednisone  if needed. She will try the voltaren for one week before using prednisone  if needed at that time. She was also advised she could use Tagamet for GERD. Advised Tagamet does interact but if she was currently taking GERD medication it has the same interaction and her dosing should be the same. Advised if she started prednisone  or Tagamet to have INR checked in 1 week after. Pt verbalized understanding. Increase dose today to take 6.25 mg (2 1/2 tablets) today and then continue 5 mg (2 tablets) daily except take 2.5 mg (1 tablet) on Sundays and Thursdays and take 3.75 mg (1 1/2 tablets) on Tuesdays. Recheck in 3 weeks.

## 2023-05-28 ENCOUNTER — Encounter: Payer: Self-pay | Admitting: Internal Medicine

## 2023-05-29 MED ORDER — SUMATRIPTAN SUCCINATE 50 MG PO TABS: ORAL_TABLET | ORAL | 1 refills | Status: AC

## 2023-06-01 ENCOUNTER — Ambulatory Visit: Payer: BC Managed Care – PPO | Admitting: Psychology

## 2023-06-08 ENCOUNTER — Other Ambulatory Visit: Payer: Self-pay | Admitting: Physician Assistant

## 2023-06-08 DIAGNOSIS — M3500 Sicca syndrome, unspecified: Secondary | ICD-10-CM

## 2023-06-08 DIAGNOSIS — M0609 Rheumatoid arthritis without rheumatoid factor, multiple sites: Secondary | ICD-10-CM

## 2023-06-08 DIAGNOSIS — Z79899 Other long term (current) drug therapy: Secondary | ICD-10-CM

## 2023-06-08 NOTE — Telephone Encounter (Signed)
Last Fill: 03/17/2023  Eye exam: 02/26/2023 WNL    Labs: 04/06/2023 CMP WNL RBC count is borderline elevated. Rest of CBC WNL.  Next Visit: 09/03/2023  Last Visit: 04/22/2024  [id number] arthritis of multiple sites with negative rheumatoid factor   Current Dose per office note 04/23/2023: Plaquenil 200 mg by mouth twice daily Monday to Friday, Methotrexate 8 tablets by mouth every week   Okay to refill Plaquenil and MTX?

## 2023-06-12 ENCOUNTER — Ambulatory Visit (INDEPENDENT_AMBULATORY_CARE_PROVIDER_SITE_OTHER): Payer: 59 | Admitting: Family Medicine

## 2023-06-12 ENCOUNTER — Ambulatory Visit: Payer: Self-pay | Admitting: Internal Medicine

## 2023-06-12 VITALS — BP 112/75 | HR 64 | Temp 97.2°F | Ht 63.0 in | Wt 163.4 lb

## 2023-06-12 DIAGNOSIS — N390 Urinary tract infection, site not specified: Secondary | ICD-10-CM | POA: Diagnosis not present

## 2023-06-12 LAB — POCT URINALYSIS DIPSTICK
Bilirubin, UA: NEGATIVE
Blood, UA: POSITIVE
Glucose, UA: NEGATIVE
Ketones, UA: NEGATIVE
Nitrite, UA: NEGATIVE
Protein, UA: NEGATIVE
Spec Grav, UA: 1.01 (ref 1.010–1.025)
Urobilinogen, UA: 0.2 U/dL
pH, UA: 6.5 (ref 5.0–8.0)

## 2023-06-12 MED ORDER — CEPHALEXIN 500 MG PO CAPS: 500.0000 mg | ORAL_CAPSULE | Freq: Two times a day (BID) | ORAL | 0 refills | Status: DC

## 2023-06-12 NOTE — Progress Notes (Signed)
   [name] is a 63 y.o. female who presents today for an office visit.  Assessment/Plan:  Urinary tract infection No red flags or signs of systemic infection. Current symptoms consistent with prior UTIs. UA with positive leukocytes. Will empirically start keflex 500 mg twice daily x 7 day while we await culture results. Encouraged hydration. We discussed reasons to return to care and seek emergent care. Follow up as needed.      Subjective:  HPI:  Patient here with concern for UTI.  Started a few days ago with dysuria.  Worse in the last day or so.  She is having more urinary urgency.  No symptoms consistent with previous UTIs.  She does have a history of recurrent UTI as well.  Tried taking cranberry supplement without much improvement. No fevers or chills. No back pain.        Objective:  Physical Exam: Ht 5\' 3"  (1.6 m)   BMI 28.56 kg/m   Gen: No acute distress, resting comfortably Neuro: Grossly normal, moves all extremities Psych: Normal affect and thought content      [name] M. Krystal Ralph, MD 06/12/2023 1:32 PM

## 2023-06-12 NOTE — Telephone Encounter (Signed)
Noted. Forwarding to provider fyi

## 2023-06-12 NOTE — Patient Instructions (Signed)
It was nice to see you!  You have a urinary tract infection. Please start the antibiotic.  We will check a urine culture to make sure you do not have a resistant bacteria. We will call you if we need to change your medications.   Please make sure you are drinking plenty of fluids over the next few days.  If your symptoms do not improve over the next 5-7 days, or if they worsen, please let us know. Please also let us know if you have worsening back pain, fevers, chills, or body aches.   Take care, Dr [name]

## 2023-06-12 NOTE — Telephone Encounter (Signed)
Copied from CRM [id number]. Topic: Clinical - Red Word Triage >> Jun 12, 2023  9:47 AM Efraim Kaufmann C wrote: [facility] that prompted transfer to Nurse Triage: Patient has a UTI that is painful and was hoping for antibiotic. She wanted appointment with doctor who has no openings today   Chief Complaint: painful urination Symptoms: urinary urgency Frequency: "a few days" worse last night  Pertinent Negatives: Patient denies fever Disposition: [] ED /[] Urgent Care (no appt availability in office) / [x] Appointment(In office/virtual)/ []  [facility]/ [] Home Care/ [] Refused Recommended Disposition /[] [facility]/ []  Follow-up with PCP Additional Notes: The patient reported that she started having pain with urination "a few days ago but last night it was worse."  She rated the pain 4/10.  She reported urinary urgency and stated that she is immunocompromised and she has frequent uti's as well as due to chronic dryness.  She was scheduled for a same day appointment at a different provider's office as her normal office does not have availability.  She was advised to be seen as soon as possible and offered the earliest available appointment however, she stated that she would be unable to provide a urine same at 1100.  She was scheduled for the next available appointment. Reason for Disposition  Diabetes mellitus or weak immune system (e.g., HIV positive, cancer chemo, splenectomy, organ transplant, chronic steroids)  Answer Assessment - Initial Assessment Questions 1. SEVERITY: "How bad is the pain?"  (e.g., Scale 1-10; mild, moderate, or severe)   - MILD (1-3): complains slightly about urination hurting   - MODERATE (4-7): interferes with normal activities     - SEVERE (8-10): excruciating, unwilling or unable to urinate because of the pain      4-5/10  2. FREQUENCY: "How many times have you had painful urination today?"      Once  3. PATTERN: "Is pain present every time you urinate or just  sometimes?"      yes 4. ONSET: "When did the painful urination start?"      Few days ago; last night it was worse 5. FEVER: "Do you have a fever?" If Yes, ask: "What is your temperature, how was it measured, and when did it start?"    No 6. PAST UTI: "Have you had a urine infection before?" If Yes, ask: "When was the last time?" and "What happened that time?"      yes 7. CAUSE: "What do you think is causing the painful urination?"  (e.g., UTI, scratch, Herpes sore)     Uti  8. OTHER SYMPTOMS: "Do you have any other symptoms?" (e.g., blood in urine, flank pain, genital sores, urgency, vaginal discharge)     urgency  Protocols used: Urination Pain - Female-A-AH

## 2023-06-14 LAB — URINE CULTURE
MICRO NUMBER:: 15994690
SPECIMEN QUALITY:: ADEQUATE

## 2023-06-15 ENCOUNTER — Ambulatory Visit: Payer: 59 | Admitting: Psychology

## 2023-06-15 DIAGNOSIS — Z62820 Parent-biological child conflict: Secondary | ICD-10-CM | POA: Diagnosis not present

## 2023-06-15 DIAGNOSIS — F411 Generalized anxiety disorder: Secondary | ICD-10-CM | POA: Diagnosis not present

## 2023-06-15 DIAGNOSIS — F4322 Adjustment disorder with anxiety: Secondary | ICD-10-CM

## 2023-06-15 NOTE — Progress Notes (Unsigned)
06/15/2023  Treatrment Plan:  Diagnosis F41.1 (Generalized anxiety disorder) [n/a]  Z62.820 (Parent child relationship problem) [n/a]  Symptoms Excessive and/or unrealistic worry that is difficult to control occurring more days than not for at least 6 months about a number of events or activities. (Status: maintained) -- No Description Entered  Regularly overindulges their child's wishes and demands. (Status: maintained) -- No Description Entered  Medication Status compliance  Safety none  If Suicidal or Homicidal State Action Taken: unspecified  Current Risk: low Medications unspecified Objectives Related Problem: Achieve a level of competent, effective parenting. Description: Increase the gradual letting go of their adolescent in constructive, affirmative ways. Target Date: 2024-05-14 Frequency: Daily Modality: individual Progress: 85%  Related Problem: Achieve a level of competent, effective parenting. Description: Identify unresolved childhood issues that affect parenting and work toward their resolution. Target Date: 2024-05-14 Frequency: Daily Modality: individual Progress: 90%  Related Problem: Achieve a level of competent, effective parenting. Description: Freely express feelings of frustration, helplessness, and inadequacy that each experiences in the parenting role. Target Date: 2024-05-14 Frequency: Daily Modality: individual Progress: 85%  Related Problem: Resolve the core conflict that is the source of anxiety. Description: Learn and implement problem-solving strategies for realistically addressing worries. Target Date: 2024-05-14 Frequency: Daily Modality: individual Progress: 85%  Related Problem: Resolve the core conflict that is the source of anxiety. Description: Learn and implement calming skills to reduce overall anxiety and manage anxiety symptoms. Target Date: 2024-05-14 Frequency: Daily Modality:  individual Progress: 75%  Related Problem: Resolve the core conflict that is the source of anxiety. Description: Describe situations, thoughts, feelings, and actions associated with anxieties and worries, their impact on functioning, and attempts to resolve them. Target Date: 2024-05-14 Frequency: Daily Modality: individual Progress: 75%  Client Response full compliance  Service Location Location, [address] [place], [place] [address]  Service Code cpt [phone]  Behavioral activation plan  Facilitate problem solving  Identify automatic thoughts  Emotion regulation skills  Provide education, information  Self care activities  Relaxation training  Lifestyle change (exercise, nutrition)  Self-monitoring  Validate/empathize  Session notes: F 41.1  Goals: She has chronic anxieties, mostly related to health concerns and her son's well-being. Would like to develop strategies to manage symptoms of anxiety. Needs to understand enmeshment and learn to define and manage appropriate family boundaries. Target date is 12-23. Patient has realized significant improvement in recognizing and establishing appropriate boundaries. Anxiety about health and family matters persist and she desires to continue treatment in an effort to manage and reduce these feelings. Revised target date is 12-25.  Meds: Xanax (.25 mg);   Patient agrees to a video session and is aware of the limitations of this platform. She is at home and I am in my home office.   Tishie says that [name] is back in [place] and is working in the office rather than at home. She is very anxious about him being in [place] near the fires. She talked about her son's inability to multi-task. He is unidimensional and solely focused on work. She talked about how to talk with him and to maintain appropriate boundaries. She and husband have different parenting styles, but she is managing that difference and is asserting herself. It is distressing to her  that her son rejects his [place] heritage.  [name], PhD 10:40a-11:30a 50 min

## 2023-06-16 ENCOUNTER — Encounter: Payer: Self-pay | Admitting: Family Medicine

## 2023-06-16 NOTE — Progress Notes (Signed)
Urine culture confirms UTI.  The antibiotic we have her on should treat this.  She should let us know if symptoms are not improving.

## 2023-06-17 ENCOUNTER — Ambulatory Visit: Payer: 59

## 2023-06-17 DIAGNOSIS — Z7901 Long term (current) use of anticoagulants: Secondary | ICD-10-CM

## 2023-06-17 LAB — POCT INR: INR: 1.5 — AB (ref 2.0–3.0)

## 2023-06-17 NOTE — Patient Instructions (Addendum)
Pre visit review using our clinic review tool, if applicable. No additional management support is needed unless otherwise documented below in the visit note.  Continue 5 mg (2 tablets) daily except take 2.5 mg (1 tablet) on Sundays and Thursdays and take 3.75 mg (1 1/2 tablets) on Tuesdays. Recheck in 4 weeks.

## 2023-06-17 NOTE — Progress Notes (Signed)
Pt was prescribed Keflex on 1/24, 500 mg BID, x 7 days.  Continue 5 mg (2 tablets) daily except take 2.5 mg (1 tablet) on Sundays and Thursdays and take 3.75 mg (1 1/2 tablets) on Tuesdays. Recheck in 4 weeks.

## 2023-06-19 ENCOUNTER — Encounter: Payer: Self-pay | Admitting: Family Medicine

## 2023-06-19 MED ORDER — CEPHALEXIN 500 MG PO CAPS: 500.0000 mg | ORAL_CAPSULE | Freq: Two times a day (BID) | ORAL | 0 refills | Status: AC

## 2023-06-19 NOTE — Telephone Encounter (Signed)
Duplicate msg sent, already sent to PCP to advise

## 2023-06-19 NOTE — Telephone Encounter (Signed)
Please see patient msg and advise if needing to extend antibiotic for patient to take for a few more days

## 2023-06-19 NOTE — Telephone Encounter (Signed)
Ok to send in another round of antibiotics but needs another visit if still not improving.  [name]. [name], MD 06/19/2023 2:00 PM

## 2023-06-29 ENCOUNTER — Ambulatory Visit: Payer: 59 | Admitting: Psychology

## 2023-06-29 DIAGNOSIS — F4322 Adjustment disorder with anxiety: Secondary | ICD-10-CM

## 2023-06-29 DIAGNOSIS — F411 Generalized anxiety disorder: Secondary | ICD-10-CM

## 2023-06-29 NOTE — Progress Notes (Signed)
 06/29/2023  Treatrment Plan:  Diagnosis F41.1 (Generalized anxiety disorder) [n/a]  Z62.820 (Parent child relationship problem) [n/a]  Symptoms Excessive and/or unrealistic worry that is difficult to control occurring more days than not for at least 6 months about a number of events or activities. (Status: maintained) -- No Description Entered  Regularly overindulges their child's wishes and demands. (Status: maintained) -- No Description Entered  Medication Status compliance  Safety none  If Suicidal or Homicidal State Action Taken: unspecified  Current Risk: low Medications unspecified Objectives Related Problem: Achieve a level of competent, effective parenting. Description: Increase the gradual letting go of their adolescent in constructive, affirmative ways. Target Date: 2024-05-14 Frequency: Daily Modality: individual Progress: 85%  Related Problem: Achieve a level of competent, effective parenting. Description: Identify unresolved childhood issues that affect parenting and work toward their resolution. Target Date: 2024-05-14 Frequency: Daily Modality: individual Progress: 90%  Related Problem: Achieve a level of competent, effective parenting. Description: Freely express feelings of frustration, helplessness, and inadequacy that each experiences in the parenting role. Target Date: 2024-05-14 Frequency: Daily Modality: individual Progress: 85%  Related Problem: Resolve the core conflict that is the source of anxiety. Description: Learn and implement problem-solving strategies for realistically addressing worries. Target Date: 2024-05-14 Frequency: Daily Modality: individual Progress: 85%  Related Problem: Resolve the core conflict that is the source of anxiety. Description: Learn and implement calming skills to reduce overall anxiety and manage anxiety symptoms. Target Date:  2024-05-14 Frequency: Daily Modality: individual Progress: 75%  Related Problem: Resolve the core conflict that is the source of anxiety. Description: Describe situations, thoughts, feelings, and actions associated with anxieties and worries, their impact on functioning, and attempts to resolve them. Target Date: 2024-05-14 Frequency: Daily Modality: individual Progress: 75%  Client Response full compliance  Service Location Location, [address] [place], [place] [address]  Service Code cpt [phone]  Behavioral activation plan  Facilitate problem solving  Identify automatic thoughts  Emotion regulation skills  Provide education, information  Self care activities  Relaxation training  Lifestyle change (exercise, nutrition)  Self-monitoring  Validate/empathize  Session notes: F 41.1  Goals: She has chronic anxieties, mostly related to health concerns and her son's well-being. Would like to develop strategies to manage symptoms of anxiety. Needs to understand enmeshment and learn to define and manage appropriate family boundaries. Target date is 12-23. Patient has realized significant improvement in recognizing and establishing appropriate boundaries. Anxiety about health and family matters persist and she desires to continue treatment in an effort to manage and reduce these feelings. Revised target date is 12-25.  Meds: Xanax  (.25 mg);   Patient agrees to a video session and is aware of the limitations of this platform. She is at home and I am in my home office.   Clarity and [name]: He states he is "struggling to keep up" at work which means there continues to be a lack of balance in his life. The two of them talked about  his lack of effort in his non-work life. He fears he will make "a lot of mistakes" unless he is extra care in checking all of his work. He thinks it is his "habit" to be slow. The fact that [name] is so overly invested in his work life continues to keep  Larene anxious and distressed. She appeals to him to alter his behavior. Is slowly starting to understand that her impact on him is minimal. She is also distressed that her son rejects their [place] heritage.                                                                                                Jola Nash, PhD 10:40a-11:30a 50 min

## 2023-07-13 ENCOUNTER — Ambulatory Visit: Payer: BC Managed Care – PPO | Admitting: Psychology

## 2023-07-14 ENCOUNTER — Ambulatory Visit: Payer: 59 | Admitting: Neurology

## 2023-07-14 ENCOUNTER — Ambulatory Visit: Payer: 59 | Admitting: Family Medicine

## 2023-07-14 ENCOUNTER — Encounter: Payer: Self-pay | Admitting: Neurology

## 2023-07-14 VITALS — BP 121/62 | HR 61 | Ht 63.0 in | Wt 165.0 lb

## 2023-07-14 DIAGNOSIS — M5417 Radiculopathy, lumbosacral region: Secondary | ICD-10-CM | POA: Diagnosis not present

## 2023-07-14 DIAGNOSIS — M0609 Rheumatoid arthritis without rheumatoid factor, multiple sites: Secondary | ICD-10-CM | POA: Diagnosis not present

## 2023-07-14 DIAGNOSIS — R208 Other disturbances of skin sensation: Secondary | ICD-10-CM | POA: Diagnosis not present

## 2023-07-14 DIAGNOSIS — M47816 Spondylosis without myelopathy or radiculopathy, lumbar region: Secondary | ICD-10-CM | POA: Diagnosis not present

## 2023-07-14 DIAGNOSIS — M4316 Spondylolisthesis, lumbar region: Secondary | ICD-10-CM

## 2023-07-14 MED ORDER — PREGABALIN 50 MG PO CAPS: 50.0000 mg | ORAL_CAPSULE | Freq: Every day | ORAL | 2 refills | Status: DC

## 2023-07-14 NOTE — Progress Notes (Signed)
 [facility]  PATIENT: [name] DOB: 18-Nov-1960  REFERRING DOCTOR OR PCP: Berniece Andreas MD; Sheran Luz MD SOURCE: Patient, notes from orthopedics and neurosurgery, imaging reports, MRI images personally reviewed  _________________________________   HISTORICAL  CHIEF COMPLAINT:  Chief Complaint  Patient presents with   Follow-up    Rm11, alone, Lumbosacral radiculopathy at L5 Facet hypertrophy of lumbar region Anterolisthesis of lumbar spine Dysesthesia Rheumatoid arthritis: pregabalin hhhelps make to make the pain more tolerable.     HISTORY OF PRESENT ILLNESS:  [name] is a 63 y.o. woman with dysesthesias.  Update 07/14/2023 She reports dysesthesias are better since starting pregabalin.  Currently, she is on 50 mg po bid (written for 50 - 100). She has noted 9 pound weight loss since starting.    She has had some constipation (less likely to be related), helped by Miralax every other day.  She occasionally  mildly forgetful or has mild word finding difficulty.  She notes it more than other people note.  She asked if she could occasionally take xanax for anxiety and we discussed that is fine  She notes more pain in her back and legs when she stands a long time.    She occasioanlly dr  History of pain: She has a h/o history of rheumatoid arthritis who reports left > right lower back pain that radiates down the left leg since September 2023.Marland Kitchen   She was initially placed on a steroid pack with benefit and has done PT.   She also tried dry needling but noted more tingling.    Since early march, she ha had tingling in the left foot and altered sensation in the back of the left calf.   She has had pain in the back of the left knee to back of thigh.    She has also noted spasms in the left calf.   She has had much milder symptoms on her right.  She notes the symptoms are worse when she sits a long time or takes a shower and better when she is more  active.   Tylenol has also helped the pain.  She notes very mild weakness in the left leg.   She had one fall to the left but had no injury.    She has been prone to UTI's but no change in last 6 months.  She has some constipation.   Gait is doing well.   LBP increases with prolonged standing or walking.    Dr. [name] ordered an MRI of the lumbar spine.  I personally reviewed the images.  The most significant finding is 3 mm of anterolisthesis at L4-L5 associated with facet hypertrophy and disc protrusion.  There also appears to be a small synovial cyst on the left.  The degenerative changes combined to cause moderately severe left lateral recess stenosis that could affect the left L5 nerve root.  There are milder changes to the right with only mild to moderate lateral recess stenosis.  There is mild bilateral foraminal narrowing.  Other levels show some degenerative changes but no spinal stenosis or nerve root compression.   She reports that an epidural steroid was discussed with her.  She also saw Dr. [name] of neurosurgery.  He also felt that there was an L5 radiculopathy to the left but that she would not be a good surgical candidate due to her osteoporosis.  He recommended pain management procedures such as epidural steroid.  She had an ultrasound of the left leg  and was told it was normal  She noted a benefit with PT She has had numbness/tingling in the fingertips.   She does not wake up with tingling in the hands.  She has had PE and is on prophylactic warfarin..  For the rheumatoid arthritis, she is on Plaquenil and methotrexate and occasional prednisone if pain worsens.    Back pain improves with prednisone but leg numbness/tingling does not change.        IMAGING Lumbar MRi 05/17/2022 was personally reviewed and showed 3 mm of anterolisthesis at L4-L5 associated with advanced facet hypertrophy and disc protrusion.  There also appears to be a small synovial cyst on the left.  The degenerative  changes combined to cause moderately severe left lateral recess stenosis that could affect the left L5 nerve root.  There are milder changes to the right with only mild to moderate lateral recess stenosis.  There is mild bilateral foraminal narrowing.  Other levels show some degenerative changes but no spinal stenosis or nerve root compression.   REVIEW OF SYSTEMS: Constitutional: No fevers, chills, sweats, or change in appetite Eyes: No visual changes, double vision, eye pain Ear, nose and throat: No hearing loss, ear pain, nasal congestion, sore throat Cardiovascular: No chest pain, palpitations Respiratory:  No shortness of breath at rest or with exertion.   No wheezes GastrointestinaI: No nausea, vomiting, diarrhea, abdominal pain, fecal incontinence Genitourinary:  No dysuria, urinary retention or frequency.  No nocturia. Musculoskeletal: As above.  She has rheumatoid arthritis. Integumentary: No rash, pruritus, skin lesions Neurological: as above Psychiatric: No depression at this time.  No anxiety Endocrine: No palpitations, diaphoresis, change in appetite, change in weigh or increased thirst Hematologic/Lymphatic:  No anemia, purpura, petechiae. Allergic/Immunologic: No itchy/runny eyes, nasal congestion, recent allergic reactions, rashes  ALLERGIES: Allergies  Allergen Reactions   Boniva [Ibandronate Sodium]     Heartburn   Gadolinium Derivatives     Other Reaction(s): Not available, Not available   Ivp Dye [Iodinated Contrast Media] Swelling    Mouth swelling.   Other Swelling    Mouth swelling. Tongue    Risedronate Sodium Other (See Comments)    Foot and bone pain and fatigue Foot and bone pain and fatigue   Risedronate Sodium [Risedronate Sodium] Other (See Comments)    Foot and bone pain and fatigue   Macrobid [Nitrofurantoin] Rash    HOME MEDICATIONS:  Current Outpatient Medications:    Calcium Carb-Cholecalciferol 600-800 MG-UNIT TABS, Take by mouth., Disp: ,  Rfl:    Cholecalciferol (VITAMIN D3) 2000 UNITS TABS, Take 1 tablet by mouth daily., Disp: , Rfl:    Cranberry 1000 MG CAPS, Take by mouth. 3 times a week, Disp: , Rfl:    Cyanocobalamin (VITAMIN B-12 PO), Take by mouth once a week., Disp: , Rfl:    cycloSPORINE (RESTASIS) 0.05 % ophthalmic emulsion, Place 1 drop into both eyes daily., Disp: , Rfl:    Eflornithine HCl 13.9 % cream, Apply topically daily. , Disp: , Rfl:    Famotidine (PEPCID PO), Take by mouth 4 (four) times a week., Disp: , Rfl:    folic acid (FOLVITE) 1 MG tablet, TAKE 2 TABLETS(2 MG) BY MOUTH DAILY, Disp: 180 tablet, Rfl: 3   hydroxychloroquine (PLAQUENIL) 200 MG tablet, TAKE 1 TABLET BY MOUTH TWICE DAILY MONDAY-FRIDAY ONLY, Disp: 120 tablet, Rfl: 0   methotrexate (RHEUMATREX) 2.5 MG tablet, TAKE 8 TABLETS BY MOUTH ONCE WEEKLY, Disp: 96 tablet, Rfl: 0   mineral/vitamin supplement (MULTIGEN) 70 MG TABS  tablet, Take 1 tablet (70 mg total) by mouth daily., Disp: 90 tablet, Rfl: 1   Omeprazole Magnesium (PRILOSEC PO), Take by mouth 3 (three) times a week., Disp: , Rfl:    Polyethylene Glycol 3350 (MIRALAX PO), Take by mouth as needed., Disp: , Rfl:    SUMAtriptan (IMITREX) 50 MG tablet, TAKE ONE TABLET BY MOUTH AS NEEDED. MAY REPEAT IN 2 TO 4 HOURS IF NEEDED., Disp: 9 tablet, Rfl: 1   warfarin (COUMADIN) 2.5 MG tablet, TAKE 2 TABLETS BY MOUTH DAILY, EXCEPT TAKE  1 TABLET ON SUNDAYS AND THURSDAYS AND TAKE 1 1/2 TABLETS ON TUESDAYS OR AS DIRECTED BY ANTICOAGULATION CLINIC, Disp: 180 tablet, Rfl: 1   pregabalin (LYRICA) 50 MG capsule, Take 1 capsule (50 mg total) by mouth daily. TAKE 1 CAPSULE BY MOUTH EVERY MORNING AND 1 CAPSULE BY MOUTH EVERY NIGHT AT BEDTIME, Disp: 180 capsule, Rfl: 2  PAST MEDICAL HISTORY: Past Medical History:  Diagnosis Date   Adjustment disorder with anxiety    B12 deficiency due to diet    is a vegetarian   Chest pain, unspecified    Chronic anticoagulation    Fatty liver    Per patient   GERD  (gastroesophageal reflux disease)    Headache(784.0)    migraines and head pain   Hx of varicella    As Child   Iron deficiency anemia, unspecified    Otalgia, unspecified    Other and unspecified hyperlipidemia    Palpitations    Personal history of venous thrombosis and embolism    Recurrent UTI    Rheumatoid arthritis(714.0)    deveshewar   Unspecified vitamin D deficiency    UTI (lower urinary tract infection) 11/28/2011   citrobacter       PAST SURGICAL HISTORY: Past Surgical History:  Procedure Laterality Date   ORIF ULNAR FRACTURE     TYMPANOSTOMY TUBE PLACEMENT  2009   Right    FAMILY HISTORY: Family History  Problem Relation Age of Onset   Parkinsonism Mother    Dementia Father    Stroke Father    Hyperlipidemia Father    Diabetes Father    Lymphoma Other    Colon cancer Neg Hx    Colon polyps Neg Hx    Esophageal cancer Neg Hx    Stomach cancer Neg Hx    Rectal cancer Neg Hx    Breast cancer Neg Hx     SOCIAL HISTORY: Social History   Socioeconomic History   Marital status: Married    Spouse name: Not on file   Number of children: 1   Years of education: Not on file   Highest education level: Bachelor's degree (e.g., BA, AB, BS)  Occupational History   Occupation: home maker  Tobacco Use   Smoking status: Never    Passive exposure: Past   Smokeless tobacco: Never  Vaping Use   Vaping status: Never Used  Substance and Sexual Activity   Alcohol use: No   Drug use: Never   Sexual activity: Not on file  Other Topics Concern   Not on file  Social History Narrative   Left handed   Caffeine use: Tea daily, coffee once a week   Regular Exercise-[place]20 yrs in [place] [employer] [place].Married HH  of 3 G1P1   Social Drivers of Corporate investment banker Strain: Low Risk  (06/12/2023)   Overall Financial Resource Strain (CARDIA)    Difficulty of Paying Living Expenses: Not hard at all  Food Insecurity:  No Food Insecurity (06/12/2023)   Hunger  Vital Sign    Worried About Running Out of Food in the Last Year: Never true    Ran Out of Food in the Last Year: Never true  Transportation Needs: No Transportation Needs (06/12/2023)   PRAPARE - Administrator, Civil Service (Medical): No    Lack of Transportation (Non-Medical): No  Physical Activity: Insufficiently Active (06/12/2023)   Exercise Vital Sign    Days of Exercise per Week: 1 day    Minutes of Exercise per Session: 40 min  Stress: Stress Concern Present (06/12/2023)   [employer] of Occupational Health - Occupational Stress Questionnaire    Feeling of Stress : To some extent  Social Connections: Unknown (06/12/2023)   Social Connection and Isolation Panel [NHANES]    Frequency of Communication with Friends and Family: More than three times a week    Frequency of Social Gatherings with Friends and Family: Patient declined    Attends Religious Services: Patient declined    Database administrator or Organizations: No    Attends Engineer, structural: Not on file    Marital Status: Married  Catering manager Violence: Not on file       PHYSICAL EXAM  Vitals:   07/14/23 1515  BP: 121/62  Pulse: 61  Weight: 165 lb (74.8 kg)  Height: 5\' 3"  (1.6 m)    Body mass index is 29.23 kg/m.   General: The patient is well-developed and well-nourished and in no acute distress  HEENT:  Head is [place]/AT.  Sclera are anicteric.   Skin: Extremities are without rash or  edema.  Musculoskeletal:  Back is nontender  Neurologic Exam  Mental status: The patient is alert and oriented x 3 at the time of the examination. The patient has apparent normal recent and remote memory, with an apparently normal attention span and concentration ability.   Speech is normal.  Cranial nerves: Extraocular movements are full.  Facial strength and sensation was normal.  Trapezius and sternocleidomastoid strength is normal. No dysarthria is noted.  The tongue is midline, and  the patient has symmetric elevation of the soft palate. No obvious hearing deficits are noted.  Motor:  Muscle bulk is normal.  Muscle tone is normal.. Strength is  5 / 5 in all 4 extremities.   Sensory: Sensory testing is intact to pinprick, soft touch and vibration sensation in all 4 extremities.  Coordination: Cerebellar testing reveals good finger-nose-finger and heel-to-shin bilaterally.  Gait and station: Station is normal.   Gait is normal. Tandem gait is normal for age.. Romberg is negative.   Reflexes: Deep tendon reflexes are symmetric and normal bilaterally.      DIAGNOSTIC DATA (LABS, IMAGING, TESTING) - I reviewed patient records, labs, notes, testing and imaging myself where available.  Lab Results  Component Value Date   WBC 7.8 04/06/2023   HGB 14.1 04/06/2023   HCT 43.4 04/06/2023   MCV 84.1 04/06/2023   PLT 248 04/06/2023      Component Value Date/Time   NA 141 04/06/2023 0917   K 4.5 04/06/2023 0917   CL 106 04/06/2023 0917   CO2 27 04/06/2023 0917   GLUCOSE 98 04/06/2023 0917   BUN 10 04/06/2023 0917   CREATININE 0.80 04/06/2023 0917   CALCIUM 8.9 04/06/2023 0917   PROT 6.7 04/06/2023 0917   ALBUMIN 4.2 08/25/2022 0919   AST 17 04/06/2023 0917   ALT 12 04/06/2023 0917   ALKPHOS 71  08/25/2022 0919   BILITOT 0.6 04/06/2023 0917   GFRNONAA 83 07/26/2020 1056   GFRAA 96 07/26/2020 1056   Lab Results  Component Value Date   CHOL 209 (H) 08/25/2022   HDL 80.70 08/25/2022   LDLCALC 89 08/25/2022   LDLDIRECT 97.3 05/25/2013   TRIG 193.0 (H) 08/25/2022   CHOLHDL 3 08/25/2022   Lab Results  Component Value Date   HGBA1C 6.1 08/25/2022   Lab Results  Component Value Date   VITAMINB12 476 08/25/2022   Lab Results  Component Value Date   TSH 2.84 08/25/2022       ASSESSMENT AND PLAN  Rheumatoid arthritis of multiple sites with negative rheumatoid factor (HCC)  Dysesthesia  Lumbosacral radiculopathy at L5  Facet hypertrophy of lumbar  region  Anterolisthesis of lumbar spine  Continue pregabalin 50 mg twice a day.  She can increase this dose to 3 pills a day if pain worsens. Stay active and exercise as tolerated. If she notes more symptoms in the hands, consider MRI of the cervical spine to further evaluate. She has a strong family history of Parkinson's disease on both sides.  She has no evidence of her movement disorder at this time.   Follow-up in 12 months or sooner if there are new or worsening neurologic symptoms.  Tika Hannis A. [name] Foot, MD, [facility] 07/14/2023, 5:35 PM Certified in Neurology, Clinical Neurophysiology, Sleep Medicine and Neuroimaging  [facility] [address] [place], [place] [address] [phone]

## 2023-07-15 ENCOUNTER — Ambulatory Visit (INDEPENDENT_AMBULATORY_CARE_PROVIDER_SITE_OTHER): Payer: 59

## 2023-07-15 DIAGNOSIS — Z7901 Long term (current) use of anticoagulants: Secondary | ICD-10-CM | POA: Diagnosis not present

## 2023-07-15 LAB — POCT INR: INR: 1.5 — AB (ref 2.0–3.0)

## 2023-07-15 MED ORDER — WARFARIN SODIUM 2.5 MG PO TABS: ORAL_TABLET | ORAL | 1 refills | Status: DC

## 2023-07-15 NOTE — Progress Notes (Signed)
 Continue 5 mg (2 tablets) daily except take 2.5 mg (1 tablet) on Sundays and Thursdays and take 3.75 mg (1 1/2 tablets) on Tuesdays. Recheck in 4 weeks.   Pt is compliant with warfarin management and PCP apts.  Sent in refill of warfarin to requested pharmacy.

## 2023-07-15 NOTE — Patient Instructions (Addendum)
 Pre visit review using our clinic review tool, if applicable. No additional management support is needed unless otherwise documented below in the visit note.  Continue 5 mg (2 tablets) daily except take 2.5 mg (1 tablet) on Sundays and Thursdays and take 3.75 mg (1 1/2 tablets) on Tuesdays. Recheck in 4 weeks.

## 2023-07-16 ENCOUNTER — Telehealth: Payer: Self-pay

## 2023-07-16 NOTE — Telephone Encounter (Signed)
 Pt reports she was prescribed cipro, 500 mg BID x 5 days today by urology for UTI. She will start script tonight. She reports she thinks cipro causes an increase in her INR and is wondering if dosing should be adjusted. Advised it is not best practice to adjust before starting this abx. Advised it is best to check during or after finishing abx.  Review of chart before did not reveal any increase in INR when pt has taken cipro in the past but there is a possibility for an interaction. Will adjust if necessary after INR check on 3/4. Scheduled pt for 3/4 at [facility] due to no coumadin clinic nurse on 3/3 at [facility]. Advised if any s/s of abnormal bruising or bleeding to go to ER. Pt verbalized understanding.

## 2023-07-21 ENCOUNTER — Ambulatory Visit: Payer: 59

## 2023-07-21 DIAGNOSIS — Z7901 Long term (current) use of anticoagulants: Secondary | ICD-10-CM

## 2023-07-21 LAB — POCT INR: INR: 1.4 — AB (ref 2.0–3.0)

## 2023-07-21 NOTE — Progress Notes (Addendum)
 Pt has been taking cipro and finished abx today. Increase dose today to take 5 mg and then continue 5 mg (2 tablets) daily except take 2.5 mg (1 tablet) on Sundays and Thursdays and take 3.75 mg (1 1/2 tablets) on Tuesdays. Recheck in 2 weeks.   Medical screening examination/treatment/procedure(s) were performed by non-physician practitioner and as supervising physician I was immediately available for consultation/collaboration.  I agree with above. Jacinta Shoe, MD

## 2023-07-21 NOTE — Patient Instructions (Addendum)
 Pre visit review using our clinic review tool, if applicable. No additional management support is needed unless otherwise documented below in the visit note.  Increase dose today to take 5 mg and then continue 5 mg (2 tablets) daily except take 2.5 mg (1 tablet) on Sundays and Thursdays and take 3.75 mg (1 1/2 tablets) on Tuesdays. Recheck in 2 weeks.

## 2023-07-27 ENCOUNTER — Ambulatory Visit: Payer: BC Managed Care – PPO | Admitting: Psychology

## 2023-07-27 DIAGNOSIS — Z62820 Parent-biological child conflict: Secondary | ICD-10-CM

## 2023-07-27 DIAGNOSIS — F4322 Adjustment disorder with anxiety: Secondary | ICD-10-CM | POA: Diagnosis not present

## 2023-07-27 NOTE — Progress Notes (Signed)
 07/27/2023  Treatrment Plan:  Diagnosis F41.1 (Generalized anxiety disorder) [n/a]  Z62.820 (Parent child relationship problem) [n/a]  Symptoms Excessive and/or unrealistic worry that is difficult to control occurring more days than not for at least 6 months about a number of events or activities. (Status: maintained) -- No Description Entered  Regularly overindulges their child's wishes and demands. (Status: maintained) -- No Description Entered  Medication Status compliance  Safety none  If Suicidal or Homicidal State Action Taken: unspecified  Current Risk: low Medications unspecified Objectives Related Problem: Achieve a level of competent, effective parenting. Description: Increase the gradual letting go of their adolescent in constructive, affirmative ways. Target Date: 2024-05-14 Frequency: Daily Modality: individual Progress: 85%  Related Problem: Achieve a level of competent, effective parenting. Description: Identify unresolved childhood issues that affect parenting and work toward their resolution. Target Date: 2024-05-14 Frequency: Daily Modality: individual Progress: 90%  Related Problem: Achieve a level of competent, effective parenting. Description: Freely express feelings of frustration, helplessness, and inadequacy that each experiences in the parenting role. Target Date: 2024-05-14 Frequency: Daily Modality: individual Progress: 85%  Related Problem: Resolve the core conflict that is the source of anxiety. Description: Learn and implement problem-solving strategies for realistically addressing worries. Target Date: 2024-05-14 Frequency: Daily Modality: individual Progress: 85%  Related Problem: Resolve the core conflict that is the source of anxiety. Description: Learn and implement calming skills to reduce overall anxiety and manage anxiety  symptoms. Target Date: 2024-05-14 Frequency: Daily Modality: individual Progress: 75%  Related Problem: Resolve the core conflict that is the source of anxiety. Description: Describe situations, thoughts, feelings, and actions associated with anxieties and worries, their impact on functioning, and attempts to resolve them. Target Date: 2024-05-14 Frequency: Daily Modality: individual Progress: 75%  Client Response full compliance  Service Location Location, [address] [place], [place] [address]  Service Code cpt [phone]  Behavioral activation plan  Facilitate problem solving  Identify automatic thoughts  Emotion regulation skills  Provide education, information  Self care activities  Relaxation training  Lifestyle change (exercise, nutrition)  Self-monitoring  Validate/empathize  Session notes: F 41.1  Goals: She has chronic anxieties, mostly related to health concerns and her son's well-being. Would like to develop strategies to manage symptoms of anxiety. Needs to understand enmeshment and learn to define and manage appropriate family boundaries. Target date is 12-23. Patient has realized significant improvement in recognizing and establishing appropriate boundaries. Anxiety about health and family matters persist and she desires to continue treatment in an effort to manage and reduce these feelings. Revised target date is 12-25.  Meds: Xanax (.25 mg);   Patient agrees to a video session and is aware of the limitations of this platform. She is at home and I am in my home office.   [name] and [name]: [name] went to [place] for work. He did very little outside of work and this reflects his reluctance to explore beyond his comfort zone. [name] says this is one of her greatest concerns. That is, her son has so little interest in anything. Talked with her about how to have a better understanding of his depressive tendencies. Encouraged [name] to again, to seek individual  counseling. He  is also sleeping exceedingly late on the weekends, which is likely part of his depressive feelings. He struggles with follow through and makes little to no effort. [name] has difficulty grasping that [name]'s depression is dictating his behavior. Also told him that he needs to be re-evaluated for his depression. He agreed to seek counseling and will report back to his mother and myself. [name] is relieved that he has agreed. This is helpful in both her understanding of his situation and the need to "take control".                                                                                                    [name], PhD 10:40a-11:30a 50 min

## 2023-08-03 NOTE — Progress Notes (Signed)
 Office Visit Note  Patient: [name]             Date of Birth: 29-Mar-1961           MRN: [id number]             PCP: [name], MD Referring: [name], MD Visit Date: 08/17/2023 Occupation: @GUAROCC @  Subjective:  Left ankle swelling  History of Present Illness: Skiler Tye is a 63 y.o. female with seronegative rheumatoid arthritis, osteoarthritis, degenerative disc disease and osteoporosis.  She returns today after her last visit in December 2024.  She states over the last few months she is having gradually increasing pain in her left ankle.  She denies any history of injury.  She states she went to see Dr. Victorino Dike who gave her a steroid patch.  She has been also using Voltaren gel which is not helping.  She states he diagnosed her with insertional Achilles tendinopathy.  He also discussed getting MRI to evaluate this further.  She states she has been having discomfort in her left ankle joint.  She has occasional discomfort in her right ankle but none of the other joints are painful.  She has been on methotrexate 8 tablets p.o. weekly and folic acid 2 mg p.o. daily.  She also takes hydroxychloroquine 200 mg p.o. twice daily Monday to Friday without any interruption.  She is on Fosamax drug holiday.  Activities of Daily Living:  Patient reports morning stiffness for 0 minute.   Patient Reports nocturnal pain.  Difficulty dressing/grooming: Denies Difficulty climbing stairs: Reports Difficulty getting out of chair: Denies Difficulty using hands for taps, buttons, cutlery, and/or writing: Reports  Review of Systems  Constitutional:  Positive for fatigue.  HENT:  Positive for mouth dryness. Negative for mouth sores.   Eyes:  Positive for dryness.  Respiratory:  Negative for shortness of breath.   Cardiovascular:  Negative for chest pain and palpitations.  Gastrointestinal:  Positive for constipation. Negative for blood in stool and diarrhea.  Endocrine:  Positive for increased urination.  Genitourinary:  Negative for involuntary urination.  Musculoskeletal:  Positive for joint pain, joint pain and joint swelling. Negative for gait problem, myalgias, muscle weakness, morning stiffness, muscle tenderness and myalgias.  Skin:  Positive for rash and sensitivity to sunlight. Negative for color change and hair loss.  Allergic/Immunologic: Positive for susceptible to infections.  Neurological:  Negative for dizziness and headaches.  Hematological:  Negative for swollen glands.  Psychiatric/Behavioral:  Negative for depressed mood and sleep disturbance. The patient is nervous/anxious.     PMFS History:  Patient Active Problem List   Diagnosis Date Noted   Lumbosacral radiculopathy at L5 10/08/2022   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HCC) 01/06/2018   Long term (current) use of anticoagulants 02/11/2017   Sicca syndrome (HCC) 12/22/2016   ANA positive 12/22/2016   Raised intraocular pressure of both eyes 12/22/2016   Migraine 03/31/2016   Anxiety 03/31/2016   High risk medication use 03/31/2016   Primary osteoarthritis of both feet 03/31/2016   Primary osteoarthritis of both knees 03/31/2016   Stiffness of left hand joint 11/27/2014   Elevated IOP 06/09/2014   Long term current use of systemic steroids 06/09/2014   Medically complex patient 06/09/2014   Current use of steroid medication 01/09/2014   Xerosis of skin 12/07/2013   Encounter for therapeutic drug monitoring 06/09/2013   B12 deficiency 05/31/2013   Iron deficiency 05/31/2013   Visit for preventive health examination 05/25/2012  Mammogram abnormal 04/12/2012   Iliac crest  pain 01/23/2012   Vaginal atrophy 01/23/2012   History of recurrent UTI (urinary tract infection) 01/23/2012   Osteoporosis 04/29/2011   Nonspecific abnormal results of liver function study 12/08/2010   Chronic anticoagulation    Anticoagulant long-term use 07/15/2010   Anticardiolipin antibody  positive 07/15/2010   Chronic cough 03/19/2009   CHEST PAIN-UNSPECIFIED 08/26/2008   PALPITATIONS, RECURRENT 07/24/2008   Vitamin D deficiency 05/30/2008   HYPERLIPIDEMIA 05/30/2008   IRON DEFICIENCY 05/30/2008   Adjustment disorder with anxiety 05/30/2008   Rheumatoid arthritis (HCC) 05/30/2008   PULMONARY EMBOLISM, HX OF 05/30/2008    Past Medical History:  Diagnosis Date   Adjustment disorder with anxiety    B12 deficiency due to diet    is a vegetarian   Chest pain, unspecified    Chronic anticoagulation    Fatty liver    Per patient   GERD (gastroesophageal reflux disease)    Headache(784.0)    migraines and head pain   Hx of varicella    As Child   Iron deficiency anemia, unspecified    Otalgia, unspecified    Other and unspecified hyperlipidemia    Palpitations    Personal history of venous thrombosis and embolism    Recurrent UTI    Rheumatoid arthritis(714.0)    deveshewar   Unspecified vitamin D deficiency    UTI (lower urinary tract infection) 11/28/2011   citrobacter       Family History  Problem Relation Age of Onset   Parkinsonism Mother    Dementia Father    Stroke Father    Hyperlipidemia Father    Diabetes Father    Lymphoma Other    Colon cancer Neg Hx    Colon polyps Neg Hx    Esophageal cancer Neg Hx    Stomach cancer Neg Hx    Rectal cancer Neg Hx    Breast cancer Neg Hx    Past Surgical History:  Procedure Laterality Date   ORIF ULNAR FRACTURE     TYMPANOSTOMY TUBE PLACEMENT  2009   Right   Social History   Social History Narrative   Left handed   Caffeine use: Tea daily, coffee once a week   Regular Exercise-no20 yrs in [place] at [employer].Married HH  of 3 G1P1   Immunization History  Administered Date(s) Administered   Influenza, Seasonal, Injecte, Preservative Fre 03/03/2023   Influenza,inj,Quad PF,6+ Mos 04/19/2013, 06/27/2015, 03/05/2016, 02/11/2017, 03/15/2018, 02/23/2019, 04/02/2020, 03/13/2021, 02/26/2022    PFIZER(Purple Top)SARS-COV-2 Vaccination 08/06/2019, 08/25/2019, 01/12/2020, 11/10/2020   PPD Test 07/21/2016   Tdap 05/31/2013   Zoster Recombinant(Shingrix) 01/23/2021, 06/11/2021     Objective: Vital Signs: BP 98/66 (BP Location: Right Arm, Patient Position: Sitting, Cuff Size: Large)   Pulse (!) 57   Resp 14   Ht 5\' 3"  (1.6 m)   Wt 164 lb (74.4 kg)   BMI 29.05 kg/m    Physical Exam Vitals and nursing note reviewed.  Constitutional:      Appearance: She is well-developed.  HENT:     Head: Normocephalic and atraumatic.  Eyes:     Conjunctiva/sclera: Conjunctivae normal.  Cardiovascular:     Rate and Rhythm: Normal rate and regular rhythm.     Heart sounds: Normal heart sounds.  Pulmonary:     Effort: Pulmonary effort is normal.     Breath sounds: Normal breath sounds.  Abdominal:     General: Bowel sounds are normal.     Palpations: Abdomen is  soft.  Musculoskeletal:     Cervical back: Normal range of motion.  Lymphadenopathy:     Cervical: No cervical adenopathy.  Skin:    General: Skin is warm and dry.     Capillary Refill: Capillary refill takes less than 2 seconds.  Neurological:     Mental Status: She is alert and oriented to person, place, and time.  Psychiatric:        Behavior: Behavior normal.      Musculoskeletal Exam: Cervical spine was in good range of motion.  Thoracic kyphosis was noted.  Shoulders, elbows, wrist, MCPs PIPs and DIPs with good range of motion with no synovitis.  Hip joints and knee joints were in good range of motion without any warmth swelling or effusion.  There was no tenderness or swelling over right ankle or MTP joints.  She had puffiness over left posterior tibial tendon.  CDAI Exam: CDAI Score: 4  Patient Global: 20 / 100; Provider Global: 20 / 100 Swollen: 1 ; Tender: 1  Joint Exam 08/17/2023      Right  Left  Ankle     Swollen Tender     Investigation: No additional findings.  Imaging: No results found.  Recent  Labs: Lab Results  Component Value Date   WBC 7.8 04/06/2023   HGB 14.1 04/06/2023   PLT 248 04/06/2023   NA 141 04/06/2023   K 4.5 04/06/2023   CL 106 04/06/2023   CO2 27 04/06/2023   GLUCOSE 98 04/06/2023   BUN 10 04/06/2023   CREATININE 0.80 04/06/2023   BILITOT 0.6 04/06/2023   ALKPHOS 71 08/25/2022   AST 17 04/06/2023   ALT 12 04/06/2023   PROT 6.7 04/06/2023   ALBUMIN 4.2 08/25/2022   CALCIUM 8.9 04/06/2023   GFRAA 96 07/26/2020   QFTBGOLDPLUS NEGATIVE 09/12/2020    Speciality Comments: PLQ Eye Exam: 02/26/2023 WNL @ [facility] Follow up 1 year Ttd with Fosamax for 5 years in the past Fosamax restarted 05/2020  Procedures:  No procedures performed Allergies: Boniva [ibandronate sodium], Gadolinium derivatives, Ivp dye [iodinated contrast media], Other, Risedronate sodium, Risedronate sodium [risedronate sodium], and Macrobid [nitrofurantoin]   Assessment / Plan:     Visit Diagnoses: Rheumatoid arthritis of multiple sites with negative rheumatoid factor (HCC) - RF negative, anti-CCP positive, ANA positive. -Patient denies having a rheumatoid arthritis flare.  She has been doing well on the combination of methotrexate and Plaquenil.  She states for the last few months she has been having pain and discomfort over the left ankle.  Plan: Sedimentation rate  High risk medication use - Plaquenil 200 mg by mouth twice daily Monday to Friday, Methotrexate 8 tablets by mouth every week, and folic acid 2 mg by mouth daily.PLQ Eye Exam: 02/26/2023 -she has not had labs since April 06, 2023 when CBC and CMP were normal.  She was advised to get labs today and every 3 months to monitor for drug toxicity.  Plan: CBC with Differential/Platelet, Comprehensive metabolic panel with GFR, CBC with Differential/Platelet, Comprehensive metabolic panel with GFR.  Information organization was placed in the AVS.  She was advised to hold methotrexate if she develops an infection and resume  after the infection resolves.  Sicca syndrome (HCC)-she can history of dry mouth and dry eye symptoms.  Over-the-counter products were discussed.  Chronic pain of both shoulders-she denies discomfort in her shoulders today.  She states she has occasional popping in her right shoulder.  Primary osteoarthritis of both knees -no  warmth swelling or effusion was noted.  X-rays on Sep 26, 2021 showed bilateral moderate osteoarthritis and moderate chondromalacia patella.  Chronic pain of left ankle-she states she has been having discomfort over the left ankle joint over the last few months.  She denies history of injury.  She states she has seen Dr. Victorino Dike at [facility].  She states that he diagnosed her with chronic Achilles tendinopathy but did not do a thorough examination.  She is also concerned that she has not had x-rays.  He has discussed MRI.  I advised her to schedule a follow-up appointment with Dr. Victorino Dike.  She will probably need MRI to evaluate this further.  Primary osteoarthritis of both feet-she continues to have some discomfort in her feet.  No synovitis was noted.  Spondylosis of lumbar spine -chronic the comfort.  She is currently not symptomatic.  MRI from [facility]--multilevel spondylosis and facet joint arthropathy.  Patient has been seeing Dr. [name], Dr. [name] and she has seen Dr. Shon Baton in the past.  Age related osteoporosis, unspecified pathological fracture presence - 02/02/2020 T score -2.8,BMD 0.649 g/cm2. 08/11/22:The BMD measured at Femur Neck Left is 0.656 g/cm2 with a T-score of -2.7.She restarted Fosamax in January 2022 and discontinued in January 2025.  I had a discussion regarding using an anabolic agent as she does not have much improvement in her BMD.  She does not want to change any medications at this point.  She states she would like to wait until next DEXA scan.  She will continue to take calcium and vitamin D.  Fatty liver - LFTs are normal.  Other medical  problems are listed as follows:  Raised intraocular pressure of both eyes  History of pulmonary embolism  History of hyperlipidemia  History of migraine  Frequent UTI  Orders: Orders Placed This Encounter  Procedures   CBC with Differential/Platelet   Comprehensive metabolic panel with GFR   CBC with Differential/Platelet   Comprehensive metabolic panel with GFR   Sedimentation rate   No orders of the defined types were placed in this encounter.    Follow-Up Instructions: Return in about 3 months (around 11/16/2023) for Rheumatoid arthritis.   Pollyann Savoy, MD  Note - This record has been created using Animal nutritionist.  Chart creation errors have been sought, but may not always  have been located. Such creation errors do not reflect on  the standard of medical care.

## 2023-08-10 ENCOUNTER — Ambulatory Visit: Payer: BC Managed Care – PPO | Admitting: Psychology

## 2023-08-10 DIAGNOSIS — Z62820 Parent-biological child conflict: Secondary | ICD-10-CM

## 2023-08-10 DIAGNOSIS — F4322 Adjustment disorder with anxiety: Secondary | ICD-10-CM | POA: Diagnosis not present

## 2023-08-10 NOTE — Progress Notes (Signed)
 08/10/2023  Treatrment Plan:  Diagnosis F41.1 (Generalized anxiety disorder) [n/a]  Z62.820 (Parent child relationship problem) [n/a]  Symptoms Excessive and/or unrealistic worry that is difficult to control occurring more days than not for at least 6 months about a number of events or activities. (Status: maintained) -- No Description Entered  Regularly overindulges their child's wishes and demands. (Status: maintained) -- No Description Entered  Medication Status compliance  Safety none  If Suicidal or Homicidal State Action Taken: unspecified  Current Risk: low Medications unspecified Objectives Related Problem: Achieve a level of competent, effective parenting. Description: Increase the gradual letting go of their adolescent in constructive, affirmative ways. Target Date: 2024-05-14 Frequency: Daily Modality: individual Progress: 85%  Related Problem: Achieve a level of competent, effective parenting. Description: Identify unresolved childhood issues that affect parenting and work toward their resolution. Target Date: 2024-05-14 Frequency: Daily Modality: individual Progress: 90%  Related Problem: Achieve a level of competent, effective parenting. Description: Freely express feelings of frustration, helplessness, and inadequacy that each experiences in the parenting role. Target Date: 2024-05-14 Frequency: Daily Modality: individual Progress: 85%  Related Problem: Resolve the core conflict that is the source of anxiety. Description: Learn and implement problem-solving strategies for realistically addressing worries. Target Date: 2024-05-14 Frequency: Daily Modality: individual Progress: 85%  Related Problem: Resolve the core conflict that is the source of anxiety. Description: Learn and implement calming skills to reduce overall  anxiety and manage anxiety symptoms. Target Date: 2024-05-14 Frequency: Daily Modality: individual Progress: 75%  Related Problem: Resolve the core conflict that is the source of anxiety. Description: Describe situations, thoughts, feelings, and actions associated with anxieties and worries, their impact on functioning, and attempts to resolve them. Target Date: 2024-05-14 Frequency: Daily Modality: individual Progress: 75%  Client Response full compliance  Service Location Location, [address] [place], [place] [address]  Service Code cpt [phone]  Behavioral activation plan  Facilitate problem solving  Identify automatic thoughts  Emotion regulation skills  Provide education, information  Self care activities  Relaxation training  Lifestyle change (exercise, nutrition)  Self-monitoring  Validate/empathize  Session notes: F 41.1  Goals: She has chronic anxieties, mostly related to health concerns and her son's well-being. Would like to develop strategies to manage symptoms of anxiety. Needs to understand enmeshment and learn to define and manage appropriate family boundaries. Target date is 12-23. Patient has realized significant improvement in recognizing and establishing appropriate boundaries. Anxiety about health and family matters persist and she desires to continue treatment in an effort to manage and reduce these feelings. Revised target date is 12-25.  Meds: Xanax (.25 mg);   Patient agrees to a video session and is aware of the limitations of this platform. She is at home and I am in my home office.   Jaiana: Patient talked about suffering RA and a number of other medical issues. Having to take many medications. Benedetta says she is feeling great anxiety about what is happening in the country, in particular with regard to immigrants. She is reluctant to talk with son about her fears because she worries he is already anxious.  We talked about the need to share  thoughts/feels within reason and to recognize he is a grown man and fully aware of the world circumstances. [name] wants to consider reducing his ani-depressant meds because he feels it is reducing his energy and contributes his lethargy. He agrees to explore local caregivers and will text me with names. Kodee is relieved that he will take control of his medical care situation. Her own anxiety of her various medical problems has worsened. She is getting appropriate medical care, but stay anxious about the road ahead. Talked about best ways to reduce her anxiety.                                                                                                        [name], PhD 10:40a-11:30a 50 min

## 2023-08-12 ENCOUNTER — Ambulatory Visit (INDEPENDENT_AMBULATORY_CARE_PROVIDER_SITE_OTHER): Payer: 59

## 2023-08-12 DIAGNOSIS — Z7901 Long term (current) use of anticoagulants: Secondary | ICD-10-CM

## 2023-08-12 LAB — POCT INR: INR: 1.8 — AB (ref 2.0–3.0)

## 2023-08-12 NOTE — Progress Notes (Signed)
 Continue 5 mg (2 tablets) daily except take 2.5 mg (1 tablet) on Sundays and Thursdays and take 3.75 mg (1 1/2 tablets) on Tuesdays. Recheck in 4 weeks.

## 2023-08-12 NOTE — Patient Instructions (Addendum)
 Pre visit review using our clinic review tool, if applicable. No additional management support is needed unless otherwise documented below in the visit note.  Continue 5 mg (2 tablets) daily except take 2.5 mg (1 tablet) on Sundays and Thursdays and take 3.75 mg (1 1/2 tablets) on Tuesdays. Recheck in 4 weeks.

## 2023-08-17 ENCOUNTER — Ambulatory Visit: Payer: Self-pay | Attending: Rheumatology | Admitting: Rheumatology

## 2023-08-17 ENCOUNTER — Encounter: Payer: Self-pay | Admitting: Rheumatology

## 2023-08-17 VITALS — BP 98/66 | HR 57 | Resp 14 | Ht 63.0 in | Wt 164.0 lb

## 2023-08-17 DIAGNOSIS — M19071 Primary osteoarthritis, right ankle and foot: Secondary | ICD-10-CM

## 2023-08-17 DIAGNOSIS — Z8669 Personal history of other diseases of the nervous system and sense organs: Secondary | ICD-10-CM

## 2023-08-17 DIAGNOSIS — M3500 Sicca syndrome, unspecified: Secondary | ICD-10-CM

## 2023-08-17 DIAGNOSIS — M19072 Primary osteoarthritis, left ankle and foot: Secondary | ICD-10-CM

## 2023-08-17 DIAGNOSIS — H40053 Ocular hypertension, bilateral: Secondary | ICD-10-CM

## 2023-08-17 DIAGNOSIS — M170 Bilateral primary osteoarthritis of knee: Secondary | ICD-10-CM

## 2023-08-17 DIAGNOSIS — Z79899 Other long term (current) drug therapy: Secondary | ICD-10-CM

## 2023-08-17 DIAGNOSIS — K760 Fatty (change of) liver, not elsewhere classified: Secondary | ICD-10-CM

## 2023-08-17 DIAGNOSIS — G8929 Other chronic pain: Secondary | ICD-10-CM

## 2023-08-17 DIAGNOSIS — Z8639 Personal history of other endocrine, nutritional and metabolic disease: Secondary | ICD-10-CM

## 2023-08-17 DIAGNOSIS — M25511 Pain in right shoulder: Secondary | ICD-10-CM

## 2023-08-17 DIAGNOSIS — M810 Age-related osteoporosis without current pathological fracture: Secondary | ICD-10-CM

## 2023-08-17 DIAGNOSIS — M0609 Rheumatoid arthritis without rheumatoid factor, multiple sites: Secondary | ICD-10-CM | POA: Diagnosis not present

## 2023-08-17 DIAGNOSIS — M25512 Pain in left shoulder: Secondary | ICD-10-CM

## 2023-08-17 DIAGNOSIS — M47816 Spondylosis without myelopathy or radiculopathy, lumbar region: Secondary | ICD-10-CM

## 2023-08-17 DIAGNOSIS — N390 Urinary tract infection, site not specified: Secondary | ICD-10-CM

## 2023-08-17 DIAGNOSIS — M25572 Pain in left ankle and joints of left foot: Secondary | ICD-10-CM

## 2023-08-17 DIAGNOSIS — Z86711 Personal history of pulmonary embolism: Secondary | ICD-10-CM

## 2023-08-17 NOTE — Patient Instructions (Signed)
Standing Labs We placed an order today for your standing lab work.   Please have your standing labs drawn in July and every 3 months  Please have your labs drawn 2 weeks prior to your appointment so that the provider can discuss your lab results at your appointment, if possible.  Please note that you may see your imaging and lab results in MyChart before we have reviewed them. We will contact you once all results are reviewed. Please allow our office up to 72 hours to thoroughly review all of the results before contacting the office for clarification of your results.  WALK-IN LAB HOURS  Monday through Thursday from 8:00 am -12:30 pm and 1:00 pm-5:00 pm and Friday from 8:00 am-12:00 pm.  Patients with office visits requiring labs will be seen before walk-in labs.  You may encounter longer than normal wait times. Please allow additional time. Wait times may be shorter on  Monday and Thursday afternoons.  We do not book appointments for walk-in labs. We appreciate your patience and understanding with our staff.   Labs are drawn by [facility]. Please bring your co-pay at the time of your lab draw.  You may receive a bill from [facility] for your lab work.  Please note if you are on Hydroxychloroquine and and an order has been placed for a Hydroxychloroquine level,  you will need to have it drawn 4 hours or more after your last dose.  If you wish to have your labs drawn at another location, please call the office 24 hours in advance so we can fax the orders.  The office is located at [address], [address], [place], [place] [address]   If you have any questions regarding directions or hours of operation,  please call [phone].   As a reminder, please drink plenty of water prior to coming for your lab work. Thanks!   Vaccines You are taking a medication(s) that can suppress your immune system.  The following immunizations are recommended: Flu annually Covid-19  Td/Tdap (tetanus, diphtheria,  pertussis) every 10 years Pneumonia (Prevnar 15 then Pneumovax 23 at least 1 year apart.  Alternatively, can take Prevnar 20 without needing additional dose) Shingrix: 2 doses from 4 weeks to 6 months apart  Please check with your PCP to make sure you are up to date.   If you have signs or symptoms of an infection or start antibiotics: First, call your PCP for workup of your infection. Hold your medication through the infection, until you complete your antibiotics, and until symptoms resolve if you take the following: Injectable medication (Actemra, Benlysta, Cimzia, Cosentyx, Enbrel, Humira, Kevzara, Orencia, Remicade, Simponi, Stelara, Taltz, Tremfya) Methotrexate Leflunomide (Arava) Mycophenolate (Cellcept) Xeljanz, Olumiant, or Rinvoq  

## 2023-08-18 ENCOUNTER — Other Ambulatory Visit: Payer: Self-pay | Admitting: Internal Medicine

## 2023-08-18 DIAGNOSIS — Z0000 Encounter for general adult medical examination without abnormal findings: Secondary | ICD-10-CM

## 2023-08-18 LAB — CBC WITH DIFFERENTIAL/PLATELET
Absolute Lymphocytes: 2578 {cells}/uL (ref 850–3900)
Absolute Monocytes: 619 {cells}/uL (ref 200–950)
Basophils Absolute: 58 {cells}/uL (ref 0–200)
Basophils Relative: 0.8 %
Eosinophils Absolute: 101 {cells}/uL (ref 15–500)
Eosinophils Relative: 1.4 %
HCT: 42.7 % (ref 35.0–45.0)
Hemoglobin: 13.8 g/dL (ref 11.7–15.5)
MCH: 27 pg (ref 27.0–33.0)
MCHC: 32.3 g/dL (ref 32.0–36.0)
MCV: 83.4 fL (ref 80.0–100.0)
MPV: 12.1 fL (ref 7.5–12.5)
Monocytes Relative: 8.6 %
Neutro Abs: 3845 {cells}/uL (ref 1500–7800)
Neutrophils Relative %: 53.4 %
Platelets: 230 10*3/uL (ref 140–400)
RBC: 5.12 10*6/uL — ABNORMAL HIGH (ref 3.80–5.10)
RDW: 12.6 % (ref 11.0–15.0)
Total Lymphocyte: 35.8 %
WBC: 7.2 10*3/uL (ref 3.8–10.8)

## 2023-08-18 LAB — COMPREHENSIVE METABOLIC PANEL WITH GFR
AG Ratio: 1.7 (calc) (ref 1.0–2.5)
ALT: 17 U/L (ref 6–29)
AST: 22 U/L (ref 10–35)
Albumin: 4.3 g/dL (ref 3.6–5.1)
Alkaline phosphatase (APISO): 79 U/L (ref 37–153)
BUN: 11 mg/dL (ref 7–25)
CO2: 26 mmol/L (ref 20–32)
Calcium: 9.1 mg/dL (ref 8.6–10.4)
Chloride: 107 mmol/L (ref 98–110)
Creat: 0.74 mg/dL (ref 0.50–1.05)
Globulin: 2.5 g/dL (ref 1.9–3.7)
Glucose, Bld: 97 mg/dL (ref 65–99)
Potassium: 4.5 mmol/L (ref 3.5–5.3)
Sodium: 140 mmol/L (ref 135–146)
Total Bilirubin: 0.6 mg/dL (ref 0.2–1.2)
Total Protein: 6.8 g/dL (ref 6.1–8.1)
eGFR: 90 mL/min/{1.73_m2} (ref >=60)

## 2023-08-18 LAB — SEDIMENTATION RATE: Sed Rate: 11 mm/h (ref 0–30)

## 2023-08-18 NOTE — Progress Notes (Signed)
 CBC, CMP are normal.  Sed rate is normal.

## 2023-08-24 ENCOUNTER — Ambulatory Visit (INDEPENDENT_AMBULATORY_CARE_PROVIDER_SITE_OTHER): Payer: 59 | Admitting: Psychology

## 2023-08-24 DIAGNOSIS — Z62820 Parent-biological child conflict: Secondary | ICD-10-CM | POA: Diagnosis not present

## 2023-08-24 DIAGNOSIS — F411 Generalized anxiety disorder: Secondary | ICD-10-CM

## 2023-08-24 DIAGNOSIS — F4322 Adjustment disorder with anxiety: Secondary | ICD-10-CM

## 2023-08-24 NOTE — Progress Notes (Signed)
 08/24/2023  Treatrment Plan:  Diagnosis F41.1 (Generalized anxiety disorder) [n/a]  Z62.820 (Parent child relationship problem) [n/a]  Symptoms Excessive and/or unrealistic worry that is difficult to control occurring more days than not for at least 6 months about a number of events or activities. (Status: maintained) -- No Description Entered  Regularly overindulges their child's wishes and demands. (Status: maintained) -- No Description Entered  Medication Status compliance  Safety none  If Suicidal or Homicidal State Action Taken: unspecified  Current Risk: low Medications unspecified Objectives Related Problem: Achieve a level of competent, effective parenting. Description: Increase the gradual letting go of their adolescent in constructive, affirmative ways. Target Date: 2024-05-14 Frequency: Daily Modality: individual Progress: 85%  Related Problem: Achieve a level of competent, effective parenting. Description: Identify unresolved childhood issues that affect parenting and work toward their resolution. Target Date: 2024-05-14 Frequency: Daily Modality: individual Progress: 90%  Related Problem: Achieve a level of competent, effective parenting. Description: Freely express feelings of frustration, helplessness, and inadequacy that each experiences in the parenting role. Target Date: 2024-05-14 Frequency: Daily Modality: individual Progress: 85%  Related Problem: Resolve the core conflict that is the source of anxiety. Description: Learn and implement problem-solving strategies for realistically addressing worries. Target Date: 2024-05-14 Frequency: Daily Modality: individual Progress: 85%  Related Problem: Resolve the core conflict that is the source of anxiety. Description: Learn and implement calming  skills to reduce overall anxiety and manage anxiety symptoms. Target Date: 2024-05-14 Frequency: Daily Modality: individual Progress: 75%  Related Problem: Resolve the core conflict that is the source of anxiety. Description: Describe situations, thoughts, feelings, and actions associated with anxieties and worries, their impact on functioning, and attempts to resolve them. Target Date: 2024-05-14 Frequency: Daily Modality: individual Progress: 75%  Client Response full compliance  Service Location Location, [address] [place], [place] [address]  Service Code cpt [phone]  Behavioral activation plan  Facilitate problem solving  Identify automatic thoughts  Emotion regulation skills  Provide education, information  Self care activities  Relaxation training  Lifestyle change (exercise, nutrition)  Self-monitoring  Validate/empathize  Session notes: F 41.1  Goals: She has chronic anxieties, mostly related to health concerns and her son's well-being. Would like to develop strategies to manage symptoms of anxiety. Needs to understand enmeshment and learn to define and manage appropriate family boundaries. Target date is 12-23. Patient has realized significant improvement in recognizing and establishing appropriate boundaries. Anxiety about health and family matters persist and she desires to continue treatment in an effort to manage and reduce these feelings. Revised target date is 12-25.  Meds: Xanax (.25 mg);   Patient agrees to a video session and is aware of the limitations of this platform. She is at home and I am in my home office.   Swathi: She states that her husband was planning to retire, but is now reconsidering because of the downturn in the financial markets. She says that her anxiety is very high and she has been constantly upset. We talked about how  to focus on those things she can control and the best ways to manage her worry. She needs to limit exposure to the  news/financial markets.  She also talked about her concerns that her son does not have people in his life other than his family. She fears that as she ages, he will be all alone as he has no other family. He did go out socially with some co-workers, which was an improvement. He has also started to exercise on the weekends, which is first self-care activity in a long time. We talked about [name] getting involved in meet up groups and he agrees to give it a try.                                                                                                            [name], PhD 10:40a-11:30a 50 min

## 2023-09-01 ENCOUNTER — Other Ambulatory Visit: Payer: Self-pay | Admitting: Orthopedic Surgery

## 2023-09-01 DIAGNOSIS — M25572 Pain in left ankle and joints of left foot: Secondary | ICD-10-CM

## 2023-09-02 ENCOUNTER — Other Ambulatory Visit: Payer: Self-pay | Admitting: Physician Assistant

## 2023-09-02 NOTE — Telephone Encounter (Signed)
 Last Fill: 06/08/2023  Eye exam: 02/26/2023 WNL    Labs: 08/17/2023 CBC, CMP are normal.  Sed rate is normal.   Next Visit: 11/19/2023  Last Visit: 08/17/2023  [id number] arthritis of multiple sites with negative rheumatoid factor   Current Dose per office note 08/17/2023: Plaquenil 200 mg by mouth twice daily Monday to Friday   Okay to refill Plaquenil?

## 2023-09-03 ENCOUNTER — Ambulatory Visit: Payer: BC Managed Care – PPO | Admitting: Rheumatology

## 2023-09-06 ENCOUNTER — Other Ambulatory Visit: Payer: Self-pay | Admitting: Physician Assistant

## 2023-09-06 DIAGNOSIS — M0609 Rheumatoid arthritis without rheumatoid factor, multiple sites: Secondary | ICD-10-CM

## 2023-09-06 DIAGNOSIS — Z79899 Other long term (current) drug therapy: Secondary | ICD-10-CM

## 2023-09-06 DIAGNOSIS — M3500 Sicca syndrome, unspecified: Secondary | ICD-10-CM

## 2023-09-07 ENCOUNTER — Ambulatory Visit (INDEPENDENT_AMBULATORY_CARE_PROVIDER_SITE_OTHER): Payer: 59 | Admitting: Psychology

## 2023-09-07 ENCOUNTER — Ambulatory Visit: Payer: 59 | Admitting: Internal Medicine

## 2023-09-07 DIAGNOSIS — F411 Generalized anxiety disorder: Secondary | ICD-10-CM | POA: Diagnosis not present

## 2023-09-07 DIAGNOSIS — Z62820 Parent-biological child conflict: Secondary | ICD-10-CM

## 2023-09-07 DIAGNOSIS — F4322 Adjustment disorder with anxiety: Secondary | ICD-10-CM

## 2023-09-07 NOTE — Telephone Encounter (Signed)
 Last Fill: 06/08/2023  Labs: 08/17/2023 CBC, CMP are normal.  Sed rate is normal.   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

## 2023-09-07 NOTE — Progress Notes (Signed)
 09/07/2023  Treatrment Plan:  Diagnosis F41.1 (Generalized anxiety disorder) [n/a]  Z62.820 (Parent child relationship problem) [n/a]  Symptoms Excessive and/or unrealistic worry that is difficult to control occurring more days than not for at least 6 months about a number of events or activities. (Status: maintained) -- No Description Entered  Regularly overindulges their child's wishes and demands. (Status: maintained) -- No Description Entered  Medication Status compliance  Safety none  If Suicidal or Homicidal State Action Taken: unspecified  Current Risk: low Medications unspecified Objectives Related Problem: Achieve a level of competent, effective parenting. Description: Increase the gradual letting go of their adolescent in constructive, affirmative ways. Target Date: 2024-05-14 Frequency: Daily Modality: individual Progress: 85%  Related Problem: Achieve a level of competent, effective parenting. Description: Identify unresolved childhood issues that affect parenting and work toward their resolution. Target Date: 2024-05-14 Frequency: Daily Modality: individual Progress: 90%  Related Problem: Achieve a level of competent, effective parenting. Description: Freely express feelings of frustration, helplessness, and inadequacy that each experiences in the parenting role. Target Date: 2024-05-14 Frequency: Daily Modality: individual Progress: 85%  Related Problem: Resolve the core conflict that is the source of anxiety. Description: Learn and implement problem-solving strategies for realistically addressing worries. Target Date: 2024-05-14 Frequency: Daily Modality: individual Progress: 85%  Related Problem: Resolve the core conflict that is the source of  anxiety. Description: Learn and implement calming skills to reduce overall anxiety and manage anxiety symptoms. Target Date: 2024-05-14 Frequency: Daily Modality: individual Progress: 75%  Related Problem: Resolve the core conflict that is the source of anxiety. Description: Describe situations, thoughts, feelings, and actions associated with anxieties and worries, their impact on functioning, and attempts to resolve them. Target Date: 2024-05-14 Frequency: Daily Modality: individual Progress: 75%  Client Response full compliance  Service Location Location, [address] [place], [place] [address]  Service Code cpt [phone]  Behavioral activation plan  Facilitate problem solving  Identify automatic thoughts  Emotion regulation skills  Provide education, information  Self care activities  Relaxation training  Lifestyle change (exercise, nutrition)  Self-monitoring  Validate/empathize  Session notes: F 41.1  Goals: She has chronic anxieties, mostly related to health concerns and her son's well-being. Would like to develop strategies to manage symptoms of anxiety. Needs to understand enmeshment and learn to define and manage appropriate family boundaries. Target date is 12-23. Patient has realized significant improvement in recognizing and establishing appropriate boundaries. Anxiety about health and family matters persist and she desires to continue treatment in an effort to manage and reduce these feelings. Revised target date is 12-25.  Meds: Xanax  (.25 mg);   Patient agrees to a video session and is aware of the limitations of this platform. She is at home and I am in my home office.   [name]: [name] says that she has a lot of stress with the current financial markets and [employer]. Husband has withdrawn his retirement for this  year. She says her husband's temper is worse as he ages. They do not communicate very often. She and [name]. [name] had a date and he  had a good time. He was happy with it, but thinks she will not go out with him again. He is doing a better job of taking care of himself, which is a huge relief to [name].                                                                                                              Jola Nash, PhD 10:40a-11:30a 50 min

## 2023-09-09 ENCOUNTER — Ambulatory Visit (INDEPENDENT_AMBULATORY_CARE_PROVIDER_SITE_OTHER)

## 2023-09-09 DIAGNOSIS — Z7901 Long term (current) use of anticoagulants: Secondary | ICD-10-CM | POA: Diagnosis not present

## 2023-09-09 LAB — POCT INR: INR: 1.6 — AB (ref 2.0–3.0)

## 2023-09-09 NOTE — Patient Instructions (Addendum)
 Pre visit review using our clinic review tool, if applicable. No additional management support is needed unless otherwise documented below in the visit note.  Continue 5 mg (2 tablets) daily except take 2.5 mg (1 tablet) on Sundays and Thursdays and take 3.75 mg (1 1/2 tablets) on Tuesdays. Recheck in 4 weeks.

## 2023-09-09 NOTE — Progress Notes (Signed)
 Continue 5 mg (2 tablets) daily except take 2.5 mg (1 tablet) on Sundays and Thursdays and take 3.75 mg (1 1/2 tablets) on Tuesdays. Recheck in 4 weeks.

## 2023-09-10 ENCOUNTER — Encounter: Payer: Self-pay | Admitting: Orthopedic Surgery

## 2023-09-11 ENCOUNTER — Encounter: Payer: Self-pay | Admitting: Orthopedic Surgery

## 2023-09-18 ENCOUNTER — Other Ambulatory Visit

## 2023-09-19 ENCOUNTER — Ambulatory Visit
Admission: RE | Admit: 2023-09-19 | Discharge: 2023-09-19 | Discharge status: Home / Self Care | Source: Ambulatory Visit | Attending: Orthopedic Surgery | Admitting: Orthopedic Surgery

## 2023-09-19 DIAGNOSIS — M25572 Pain in left ankle and joints of left foot: Secondary | ICD-10-CM

## 2023-09-21 ENCOUNTER — Ambulatory Visit (INDEPENDENT_AMBULATORY_CARE_PROVIDER_SITE_OTHER): Admitting: Psychology

## 2023-09-21 DIAGNOSIS — F411 Generalized anxiety disorder: Secondary | ICD-10-CM | POA: Diagnosis not present

## 2023-09-21 NOTE — Progress Notes (Signed)
 09/21/2023  Treatrment Plan:  Diagnosis F41.1 (Generalized anxiety disorder) [n/a]  Z62.820 (Parent child relationship problem) [n/a]  Symptoms Excessive and/or unrealistic worry that is difficult to control occurring more days than not for at least 6 months about a number of events or activities. (Status: maintained) -- No Description Entered  Regularly overindulges their child's wishes and demands. (Status: maintained) -- No Description Entered  Medication Status compliance  Safety none  If Suicidal or Homicidal State Action Taken: unspecified  Current Risk: low Medications unspecified Objectives Related Problem: Achieve a level of competent, effective parenting. Description: Increase the gradual letting go of their adolescent in constructive, affirmative ways. Target Date: 2024-05-14 Frequency: Daily Modality: individual Progress: 85%  Related Problem: Achieve a level of competent, effective parenting. Description: Identify unresolved childhood issues that affect parenting and work toward their resolution. Target Date: 2024-05-14 Frequency: Daily Modality: individual Progress: 90%  Related Problem: Achieve a level of competent, effective parenting. Description: Freely express feelings of frustration, helplessness, and inadequacy that each experiences in the parenting role. Target Date: 2024-05-14 Frequency: Daily Modality: individual Progress: 85%  Related Problem: Resolve the core conflict that is the source of anxiety. Description: Learn and implement problem-solving strategies for realistically addressing worries. Target Date: 2024-05-14 Frequency: Daily Modality: individual Progress: 85%  Related Problem: Resolve the core conflict that is the  source of anxiety. Description: Learn and implement calming skills to reduce overall anxiety and manage anxiety symptoms. Target Date: 2024-05-14 Frequency: Daily Modality: individual Progress: 75%  Related Problem: Resolve the core conflict that is the source of anxiety. Description: Describe situations, thoughts, feelings, and actions associated with anxieties and worries, their impact on functioning, and attempts to resolve them. Target Date: 2024-05-14 Frequency: Daily Modality: individual Progress: 75%  Client Response full compliance  Service Location Location, [address] [place], [place] [address]  Service Code cpt [phone]  Behavioral activation plan  Facilitate problem solving  Identify automatic thoughts  Emotion regulation skills  Provide education, information  Self care activities  Relaxation training  Lifestyle change (exercise, nutrition)  Self-monitoring  Validate/empathize  Session notes: F 41.1  Goals: She has chronic anxieties, mostly related to health concerns and her son's well-being. Would like to develop strategies to manage symptoms of anxiety. Needs to understand enmeshment and learn to define and manage appropriate family boundaries. Target date is 12-23. Patient has realized significant improvement in recognizing and establishing appropriate boundaries. Anxiety about health and family matters persist and she desires to continue treatment in an effort to manage and reduce these feelings. Revised target date is 12-25.  Meds: Xanax  (.25 mg);   Patient agrees to a video session and is aware of the limitations of this platform. She is at home and I am in my home office.   Litsy: Banafshef says  that she has been suffering back  pain. She has not gotten it evaluated but will schedule. [name] says that he has made a social plan to get together with an old "friend" from [employer]. He has also started talking with a woman on the dating app and is going  to meet her on Saturday. He discussed his reluctance and insecurity. Luan reports high anxiety over her own health in addition to the worry about her son. Again encouraged [name] to meet with psychiatrist and get referral for a psychologist. He agrees to do this. Mother does not think he will do this, but he is willing to move forward.                                                                                                                  Jola Nash, PhD 10:45a-11:30a 45 min

## 2023-10-05 ENCOUNTER — Ambulatory Visit: Admitting: Psychology

## 2023-10-05 DIAGNOSIS — F411 Generalized anxiety disorder: Secondary | ICD-10-CM | POA: Diagnosis not present

## 2023-10-05 NOTE — Progress Notes (Signed)
 10/05/2023  Treatrment Plan:  Diagnosis F41.1 (Generalized anxiety disorder) [n/a]  Z62.820 (Parent child relationship problem) [n/a]  Symptoms Excessive and/or unrealistic worry that is difficult to control occurring more days than not for at least 6 months about a number of events or activities. (Status: maintained) -- No Description Entered  Regularly overindulges their child's wishes and demands. (Status: maintained) -- No Description Entered  Medication Status compliance  Safety none  If Suicidal or Homicidal State Action Taken: unspecified  Current Risk: low Medications unspecified Objectives Related Problem: Achieve a level of competent, effective parenting. Description: Increase the gradual letting go of their adolescent in constructive, affirmative ways. Target Date: 2024-05-14 Frequency: Daily Modality: individual Progress: 85%  Related Problem: Achieve a level of competent, effective parenting. Description: Identify unresolved childhood issues that affect parenting and work toward their resolution. Target Date: 2024-05-14 Frequency: Daily Modality: individual Progress: 90%  Related Problem: Achieve a level of competent, effective parenting. Description: Freely express feelings of frustration, helplessness, and inadequacy that each experiences in the parenting role. Target Date: 2024-05-14 Frequency: Daily Modality: individual Progress: 85%  Related Problem: Resolve the core conflict that is the source of anxiety. Description: Learn and implement problem-solving strategies for realistically addressing worries. Target Date: 2024-05-14 Frequency: Daily Modality: individual Progress: 85%  Related Problem: Resolve  the core conflict that is the source of anxiety. Description: Learn and implement calming skills to reduce overall anxiety and manage anxiety symptoms. Target Date: 2024-05-14 Frequency: Daily Modality: individual Progress: 75%  Related Problem: Resolve the core conflict that is the source of anxiety. Description: Describe situations, thoughts, feelings, and actions associated with anxieties and worries, their impact on functioning, and attempts to resolve them. Target Date: 2024-05-14 Frequency: Daily Modality: individual Progress: 75%  Client Response full compliance  Service Location Location, [address] [place], [place] [address]  Service Code cpt [phone]  Behavioral activation plan  Facilitate problem solving  Identify automatic thoughts  Emotion regulation skills  Provide education, information  Self care activities  Relaxation training  Lifestyle change (exercise, nutrition)  Self-monitoring  Validate/empathize  Session notes: F 41.1  Goals: She has chronic anxieties, mostly related to health concerns and her son's well-being. Would like to develop strategies to manage symptoms of anxiety. Needs to understand enmeshment and learn to define and manage appropriate family boundaries. Target date is 12-23. Patient has realized significant improvement in recognizing and establishing appropriate boundaries. Anxiety about health and family matters persist and she desires to continue treatment in an effort to manage and reduce these feelings. Revised target date is 12-25.  Meds: Xanax  (.25 mg);   Patient agrees to a video session and is aware of the limitations of this platform. She is at home and I am in  my home office.   BanafshehMarianne [name] says her son is coming home for a week to see doctors. He has a number of medical issues which creates anxiety for [name] and [name]. He has particular difficulty waking up in the morning, regardless the amount of sleep during the  night. Discussed some of the possible explanations for [name]'s fatigue.He believes that his processing speed is a significant factor in his anxiety as it keeps him from efficiency in his work and daily activities. [name] talked about having her son home and expectations.                                                                                                                      Jola Nash, PhD 10:45a-11:30a 45 min

## 2023-10-06 NOTE — Progress Notes (Signed)
 Chief Complaint  Patient presents with   Annual Exam    Pt reports she is fasting and request INR in lab. Would like Rx Urea  40% for dry heels and Rx Mupirocin . Pt reports weight gain from Lyrica .    Edema    On L ankle   Dizziness    HPI: Patient  [name]  63 y.o. comes in today for Preventive Health Care visit  And disc CDM Urology 1-2 x per year  last uti 3 mos ago? [facility]  change in bms    Dr D  will need upcoming labs  encouraging to try Forteo some reluctance  thinking about this .  Hewitt  Sater pinched nerve  from back and also left ankle swelling  tendinopathy ?   To get MRI of ankle area  Dr Rosebud Confer    Dr Godwin Lat put on pre gabaline  one  bid helped  so much for pain and continued exercise  Concern about weight gain in months  not as active . No new cp sob  but has some swelling in both le .  Cp of dizzines when doing some exercise on floor  uncertain if ligh theaded or  other . No assic sx .cp sob tachy. Not all the time  Has had some low bp readings  in 90s for ? No reasoning Due for  protime no change ,no bleeding  Noted waxing and waning  right breast pink redness over months no pain or nodule or  trauma  has mammo due in June and gyne check in July?   Health Maintenance  Topic Date Due   Pneumonia Vaccine 74+ Years old (1 of 1 - PCV) Never done   COVID-19 Vaccine (5 - 2024-25 season) 01/18/2023   DTaP/Tdap/Td (2 - Td or Tdap) 06/01/2023   INFLUENZA VACCINE  12/18/2023   MAMMOGRAM  11/09/2024   Cervical Cancer Screening (HPV/Pap Cotest)  10/31/2027   Colonoscopy  07/19/2028   DEXA SCAN  Completed   Hepatitis C Screening  Completed   HIV Screening  Completed   Zoster Vaccines- Shingrix  Completed   HPV VACCINES  Aged Out   Meningococcal B Vaccine  Aged Out   Health Maintenance Review LIFESTYLE:  Exercise:   not much cause of left ankle   Tobacco/ETS:n Alcohol: n Sugar beverages:n Sleep:  at least 8 hours  Drug use: no HH of 2 no  ROS:   GEN/ HEENT: No fever, significant weight changes sweats headaches vision problems hearing changes, CV/ PULM; No chest pain shortness of breath cough, syncope change in exercise tolerance. GI /GU: No adominal pain, vomiting, change in bowel habits. No blood in the stool. No significant GU symptoms. SKIN/HEME: ,no acute skin rashes suspicious lesions or bleeding. No lymphadenopathy, nodules, masses.   IMM/ Allergy: No unusual infections.  Allergy .   REST of 12 system review negative except as per HPI   Past Medical History:  Diagnosis Date   Adjustment disorder with anxiety    B12 deficiency due to diet    is a vegetarian   Chest pain, unspecified    Chronic anticoagulation    Fatty liver    Per patient   GERD (gastroesophageal reflux disease)    Headache(784.0)    migraines and head pain   Hx of varicella    As Child   Iron deficiency anemia, unspecified    Otalgia, unspecified    Other and unspecified hyperlipidemia    Palpitations  Personal history of venous thrombosis and embolism    Recurrent UTI    Rheumatoid arthritis(714.0)    deveshewar   Unspecified vitamin D  deficiency    UTI (lower urinary tract infection) 11/28/2011   citrobacter       Past Surgical History:  Procedure Laterality Date   ORIF ULNAR FRACTURE     TYMPANOSTOMY TUBE PLACEMENT  2009   Right    Family History  Problem Relation Age of Onset   Parkinsonism Mother    Dementia Father    Stroke Father    Hyperlipidemia Father    Diabetes Father    Lymphoma Other    Colon cancer Neg Hx    Colon polyps Neg Hx    Esophageal cancer Neg Hx    Stomach cancer Neg Hx    Rectal cancer Neg Hx    Breast cancer Neg Hx     Social History   Socioeconomic History   Marital status: Married    Spouse name: Not on file   Number of children: 1   Years of education: Not on file   Highest education level: Bachelor's degree (e.g., BA, AB, BS)  Occupational History   Occupation: home maker  Tobacco  Use   Smoking status: Never    Passive exposure: Past   Smokeless tobacco: Never  Vaping Use   Vaping status: Never Used  Substance and Sexual Activity   Alcohol use: No   Drug use: Never   Sexual activity: Not on file  Other Topics Concern   Not on file  Social History Narrative   Left handed   Caffeine use: Tea daily, coffee once a week   Regular Exercise-[place]rs [place]Married HH  of 3 G1P1   Social Drivers of Corporate investment banker Strain: Low Risk  (06/12/2023)   Overall Financial Resource Strain (CARDIA)    Difficulty of Paying Living Expenses: Not hard at all  Food Insecurity: No Food Insecurity (06/12/2023)   Hunger Vital Sign    Worried About Running Out of Food in the Last Year: Never true    Ran Out of Food in the Last Year: Never true  Transportation Needs: No Transportation Needs (06/12/2023)   PRAPARE - Administrator, Civil Service (Medical): No    Lack of Transportation (Non-Medical): No  Physical Activity: Insufficiently Active (06/12/2023)   Exercise Vital Sign    Days of Exercise per Week: 1 day    Minutes of Exercise per Session: 40 min  Stress: Stress Concern Present (06/12/2023)   [employer] of Occupational Health - Occupational Stress Questionnaire    Feeling of Stress : To some extent  Social Connections: Unknown (06/12/2023)   Social Connection and Isolation Panel [NHANES]    Frequency of Communication with Friends and Family: More than three times a week    Frequency of Social Gatherings with Friends and Family: Patient declined    Attends Religious Services: Patient declined    Database administrator or Organizations: No    Attends Engineer, structural: Not on file    Marital Status: Married    Outpatient Medications Prior to Visit  Medication Sig Dispense Refill   AZO D-MANNOSE PO Take by mouth.     Calcium Carb-Cholecalciferol 600-800 MG-UNIT TABS Take by mouth.     Cholecalciferol  (VITAMIN D3) 2000 UNITS TABS Take 1 tablet by mouth daily.     Cyanocobalamin  (VITAMIN B-12 PO) Take by mouth once a  week.     cycloSPORINE (RESTASIS) 0.05 % ophthalmic emulsion Place 1 drop into both eyes daily.     diclofenac Sodium (VOLTAREN) 1 % GEL Apply topically 4 (four) times daily.     Eflornithine HCl 13.9 % cream Apply topically daily.      Famotidine (PEPCID PO) Take by mouth 4 (four) times a week.     folic acid  (FOLVITE ) 1 MG tablet TAKE 2 TABLETS(2 MG) BY MOUTH DAILY 180 tablet 3   hydroxychloroquine  (PLAQUENIL ) 200 MG tablet TAKE 1 TABLET BY MOUTH TWICE DAILY MONDAY-FRIDAY ONLY 120 tablet 0   methotrexate  (RHEUMATREX) 2.5 MG tablet TAKE 8 TABLETS BY MOUTH ONCE WEEKLY 96 tablet 0   mineral/vitamin supplement (MULTIGEN) 70 MG TABS tablet Take 1 tablet (70 mg total) by mouth daily. 90 tablet 1   Omeprazole  Magnesium (PRILOSEC PO) Take by mouth 3 (three) times a week.     Polyethylene Glycol 3350 (MIRALAX PO) Take by mouth as needed.     pregabalin  (LYRICA ) 50 MG capsule Take 1 capsule (50 mg total) by mouth daily. TAKE 1 CAPSULE BY MOUTH EVERY MORNING AND 1 CAPSULE BY MOUTH EVERY NIGHT AT BEDTIME 180 capsule 2   SUMAtriptan  (IMITREX ) 50 MG tablet TAKE ONE TABLET BY MOUTH AS NEEDED. MAY REPEAT IN 2 TO 4 HOURS IF NEEDED. 9 tablet 1   warfarin (COUMADIN ) 2.5 MG tablet TAKE 2 TABLETS BY MOUTH DAILY, EXCEPT TAKE  1 TABLET ON SUNDAYS AND THURSDAYS AND TAKE 1 1/2 TABLETS ON TUESDAYS OR AS DIRECTED BY ANTICOAGULATION CLINIC 180 tablet 1   Cranberry 1000 MG CAPS Take by mouth. 3 times a week (Patient not taking: Reported on 08/17/2023)     No facility-administered medications prior to visit.     EXAM:  BP 108/66 (BP Location: Right Arm, Patient Position: Sitting, Cuff Size: Normal)   Pulse (!) 58   Temp 98.2 F (36.8 C) (Oral)   Ht 5\' 2"  (1.575 m)   Wt 165 lb 3.2 oz (74.9 kg)   SpO2 98%   BMI 30.22 kg/m   Body mass index is 30.22 kg/m. Wt Readings from Last 3 Encounters:   10/07/23 165 lb 3.2 oz (74.9 kg)  08/17/23 164 lb (74.4 kg)  07/14/23 165 lb (74.8 kg)    Physical Exam: Vital signs reviewed ZOX:WRUE is a well-developed well-nourished alert cooperative    who appearsr stated age in no acute distress.  HEENT: normocephalic atraumatic , Eyes: PERRL EOM's full, conjunctiva clear, Nares: paten,t no deformity discharge or tenderness., Ears: no deformity EAC's clear TMs with normal landmarks. Mouth: clear OP, no lesions, edema.  Moist mucous membranes. Dentition in adequate repair. NECK: supple without masses, thyromegaly or bruits. CHEST/PULM:  Clear to auscultation and percussion breath sounds equal no wheeze , rales or rhonchi. No chest wall deformities or tenderness. Breast: r breast with some pink skin irreg changes  no thickening nipple change or dc  No dimpling, discharge, masses, tenderness or discharge . CV: PMI is nondisplaced, S1 S2 no gallops, murmurs, rubs. Peripheral pulses are full without delay.No JVD .  ABDOMEN: Bowel sounds normal nontender  No guard or rebound, no hepato splenomegal no CVA tenderness.  No hernia. Extremtities:  No clubbing cyanosis or edema, no acute joint swelling or redness no focal atrophy NEURO:  Oriented x3, cranial nerves 3-12 appear to be intact, no obvious focal weakness,gait within normal limits no abnormal reflexes or asymmetrical SKIN: No acute rashes normal turgor, color, no bruising or petechiae. PSYCH: Oriented, good  eye contact, no obvious depression anxiety, cognition and judgment appear normal. LN: no cervical axillary inguinal adenopathy  Lab Results  Component Value Date   WBC 7.2 08/17/2023   HGB 13.8 08/17/2023   HCT 42.7 08/17/2023   PLT 230 08/17/2023   GLUCOSE 97 08/17/2023   CHOL 209 (H) 08/25/2022   TRIG 193.0 (H) 08/25/2022   HDL 80.70 08/25/2022   LDLDIRECT 97.3 05/25/2013   LDLCALC 89 08/25/2022   ALT 17 08/17/2023   AST 22 08/17/2023   NA 140 08/17/2023   K 4.5 08/17/2023   CL 107  08/17/2023   CREATININE 0.74 08/17/2023   BUN 11 08/17/2023   CO2 26 08/17/2023   TSH 2.84 08/25/2022   INR 1.6 (A) 10/07/2023   HGBA1C 6.1 08/25/2022    BP Readings from Last 3 Encounters:  10/07/23 108/66  08/17/23 98/66  07/14/23 121/62    Lab results reviewed with patient   ASSESSMENT AND PLAN:  Discussed the following assessment and plan:    ICD-10-CM   1. Visit for preventive health examination  Z00.00 Comprehensive metabolic panel with GFR    CBC with Differential/Platelet    Hemoglobin A1c    Vitamin D , 25-hydroxy    IBC + Ferritin    TSH    Microalbumin / creatinine urine ratio    C-reactive protein    Uric Acid    Lipid panel    T4, free    2. Long term (current) use of anticoagulants  Z79.01 Comprehensive metabolic panel with GFR    CBC with Differential/Platelet    Hemoglobin A1c    Vitamin D , 25-hydroxy    IBC + Ferritin    TSH    Microalbumin / creatinine urine ratio    C-reactive protein    Uric Acid    Lipid panel    T4, free    POC INR    3. Medication management  Z79.899 Comprehensive metabolic panel with GFR    CBC with Differential/Platelet    Hemoglobin A1c    Vitamin D , 25-hydroxy    IBC + Ferritin    TSH    Microalbumin / creatinine urine ratio    C-reactive protein    Uric Acid    Lipid panel    T4, free    4. Rheumatoid arthritis of multiple sites with negative rheumatoid factor (HCC)  M06.09 Comprehensive metabolic panel with GFR    CBC with Differential/Platelet    Hemoglobin A1c    Vitamin D , 25-hydroxy    IBC + Ferritin    TSH    Microalbumin / creatinine urine ratio    C-reactive protein    Uric Acid    Lipid panel    T4, free    5. Medically complex patient  Z78.9 Comprehensive metabolic panel with GFR    CBC with Differential/Platelet    Hemoglobin A1c    Vitamin D , 25-hydroxy    IBC + Ferritin    TSH    Microalbumin / creatinine urine ratio    C-reactive protein    Uric Acid    Lipid panel    T4, free     6. Vitamin D  deficiency  E55.9 Comprehensive metabolic panel with GFR    CBC with Differential/Platelet    Hemoglobin A1c    Vitamin D , 25-hydroxy    IBC + Ferritin    TSH    Microalbumin / creatinine urine ratio    C-reactive protein    Uric Acid    Lipid panel  T4, free    7. Vitamin B12 deficiency  E53.8 Comprehensive metabolic panel with GFR    CBC with Differential/Platelet    Hemoglobin A1c    Vitamin D , 25-hydroxy    IBC + Ferritin    TSH    Microalbumin / creatinine urine ratio    C-reactive protein    Uric Acid    Lipid panel    T4, free    8. B12 deficiency  E53.8 Comprehensive metabolic panel with GFR    CBC with Differential/Platelet    Hemoglobin A1c    Vitamin D , 25-hydroxy    IBC + Ferritin    TSH    Microalbumin / creatinine urine ratio    C-reactive protein    Uric Acid    Lipid panel    T4, free    9. Episode of dizziness  R42 Comprehensive metabolic panel with GFR    CBC with Differential/Platelet    Hemoglobin A1c    Vitamin D , 25-hydroxy    IBC + Ferritin    TSH    Microalbumin / creatinine urine ratio    C-reactive protein    Uric Acid    Lipid panel    T4, free    10. Symptoms in breast  N64.59 MM 3D DIAGNOSTIC MAMMOGRAM UNILATERAL RIGHT BREAST   r breast with pink redness most days for months no mass dc or distortion or obv cause    After discuss will go ahead and send order for dx mammo ad [facility]  Monitor  bp and sx   exam cv and pulm are normal today  Update renal blood count thyroid . Weight gain may be from meds and inactivity cause of left ankel predicament . Plan  fu if any worse or 3-4 months   Multiple medical conditions  to be followed  she manages well  Hopefully the lle predicament can improve.  Consider bone building therapy and disc with DR.  D  evenity/ forteo etc do have good outcomes  some risk   Return in about 4 months (around 02/07/2024) for depending on results.  Patient Care Team: Aquiles Ruffini, Joaquim Muir, MD  as PCP - General Nicholas Bari, MD (Rheumatology) Serafin Dames, MD (Gastroenterology) Reynold Caves, MD (Otolaryngology) Dohmeier, Raoul Byes, MD (Neurology) Avis Boehringer, MD (Dermatology) Arlee Lace, MD as Consulting Physician (Obstetrics and Gynecology) Garrison Kanner, Carolene Chute, MD as Referring Physician (Hematology and Oncology) Florida Hurter, MD as Consulting Physician (Orthopedic Surgery) Christina Coyer, MD as Consulting Physician (Urology) Adelaide Adjutant, MD as Consulting Physician (Physical Medicine and Rehabilitation) Kerry Peel., MD (Urology) Patient Instructions  Check BP when getting dizzy lightheaded. Checking lab today . For a number of  problems  Consider medication adding. If bp below 100 plan fu  Heart  and lungs  are normal exam today . Not sure about the redness r breast  I  would  advise  we get evaluation  mammogram earlier  and  see GYNE there are no masses today .    Makayah Pauli K. Lakyia Behe M.D.

## 2023-10-07 ENCOUNTER — Ambulatory Visit (INDEPENDENT_AMBULATORY_CARE_PROVIDER_SITE_OTHER): Payer: Self-pay | Admitting: Internal Medicine

## 2023-10-07 ENCOUNTER — Encounter: Payer: Self-pay | Admitting: Internal Medicine

## 2023-10-07 ENCOUNTER — Other Ambulatory Visit: Payer: Self-pay | Admitting: Internal Medicine

## 2023-10-07 ENCOUNTER — Ambulatory Visit (INDEPENDENT_AMBULATORY_CARE_PROVIDER_SITE_OTHER): Payer: Self-pay

## 2023-10-07 ENCOUNTER — Other Ambulatory Visit: Payer: Self-pay | Admitting: Physician Assistant

## 2023-10-07 VITALS — BP 108/66 | HR 58 | Temp 98.2°F | Ht 62.0 in | Wt 165.2 lb

## 2023-10-07 DIAGNOSIS — Z0000 Encounter for general adult medical examination without abnormal findings: Secondary | ICD-10-CM

## 2023-10-07 DIAGNOSIS — R42 Dizziness and giddiness: Secondary | ICD-10-CM

## 2023-10-07 DIAGNOSIS — Z0001 Encounter for general adult medical examination with abnormal findings: Secondary | ICD-10-CM

## 2023-10-07 DIAGNOSIS — Z7901 Long term (current) use of anticoagulants: Secondary | ICD-10-CM | POA: Diagnosis not present

## 2023-10-07 DIAGNOSIS — M0609 Rheumatoid arthritis without rheumatoid factor, multiple sites: Secondary | ICD-10-CM

## 2023-10-07 DIAGNOSIS — Z79899 Other long term (current) drug therapy: Secondary | ICD-10-CM

## 2023-10-07 DIAGNOSIS — E538 Deficiency of other specified B group vitamins: Secondary | ICD-10-CM

## 2023-10-07 DIAGNOSIS — N6459 Other signs and symptoms in breast: Secondary | ICD-10-CM | POA: Diagnosis not present

## 2023-10-07 DIAGNOSIS — E559 Vitamin D deficiency, unspecified: Secondary | ICD-10-CM

## 2023-10-07 DIAGNOSIS — Z789 Other specified health status: Secondary | ICD-10-CM

## 2023-10-07 LAB — CBC WITH DIFFERENTIAL/PLATELET
Basophils Absolute: 0 10*3/uL (ref 0.0–0.1)
Basophils Relative: 0.5 % (ref 0.0–3.0)
Eosinophils Absolute: 0.1 10*3/uL (ref 0.0–0.7)
Eosinophils Relative: 1 % (ref 0.0–5.0)
HCT: 43.1 % (ref 36.0–46.0)
Hemoglobin: 14.1 g/dL (ref 12.0–15.0)
Lymphocytes Relative: 33.1 % (ref 12.0–46.0)
Lymphs Abs: 2.3 10*3/uL (ref 0.7–4.0)
MCHC: 32.7 g/dL (ref 30.0–36.0)
MCV: 83.4 fl (ref 78.0–100.0)
Monocytes Absolute: 0.5 10*3/uL (ref 0.1–1.0)
Monocytes Relative: 7.6 % (ref 3.0–12.0)
Neutro Abs: 4 10*3/uL (ref 1.4–7.7)
Neutrophils Relative %: 57.8 % (ref 43.0–77.0)
Platelets: 225 10*3/uL (ref 150.0–400.0)
RBC: 5.16 Mil/uL — ABNORMAL HIGH (ref 3.87–5.11)
RDW: 14.6 % (ref 11.5–15.5)
WBC: 6.9 10*3/uL (ref 4.0–10.5)

## 2023-10-07 LAB — COMPREHENSIVE METABOLIC PANEL WITH GFR
ALT: 14 U/L (ref 0–35)
AST: 22 U/L (ref 0–37)
Albumin: 4.3 g/dL (ref 3.5–5.2)
Alkaline Phosphatase: 86 U/L (ref 39–117)
BUN: 9 mg/dL (ref 6–23)
CO2: 26 meq/L (ref 19–32)
Calcium: 9 mg/dL (ref 8.4–10.5)
Chloride: 106 meq/L (ref 96–112)
Creatinine, Ser: 0.86 mg/dL (ref 0.40–1.20)
GFR: 70.97 mL/min (ref >60.00)
Glucose, Bld: 94 mg/dL (ref 70–99)
Potassium: 4.3 meq/L (ref 3.5–5.1)
Sodium: 139 meq/L (ref 135–145)
Total Bilirubin: 0.7 mg/dL (ref 0.2–1.2)
Total Protein: 7.3 g/dL (ref 6.0–8.3)

## 2023-10-07 LAB — IBC + FERRITIN
Ferritin: 167.3 ng/mL (ref 10.0–291.0)
Iron: 91 ug/dL (ref 42–145)
Saturation Ratios: 29.3 % (ref 20.0–50.0)
TIBC: 310.8 ug/dL (ref 250.0–450.0)
Transferrin: 222 mg/dL (ref 212.0–360.0)

## 2023-10-07 LAB — VITAMIN D 25 HYDROXY (VIT D DEFICIENCY, FRACTURES): VITD: 39.4 ng/mL (ref 30.00–100.00)

## 2023-10-07 LAB — POCT INR: INR: 1.6 — AB (ref 2.0–3.0)

## 2023-10-07 LAB — T4, FREE: Free T4: 0.86 ng/dL (ref 0.60–1.60)

## 2023-10-07 LAB — LIPID PANEL
Cholesterol: 214 mg/dL — ABNORMAL HIGH (ref 0–200)
HDL: 75.5 mg/dL (ref >39.00)
LDL Cholesterol: 111 mg/dL — ABNORMAL HIGH (ref 0–99)
NonHDL: 138.35
Total CHOL/HDL Ratio: 3
Triglycerides: 139 mg/dL (ref 0.0–149.0)
VLDL: 27.8 mg/dL (ref 0.0–40.0)

## 2023-10-07 LAB — MICROALBUMIN / CREATININE URINE RATIO
Creatinine,U: 57.3 mg/dL
Microalb Creat Ratio: UNDETERMINED mg/g (ref 0.0–30.0)
Microalb, Ur: <0.7 mg/dL

## 2023-10-07 LAB — C-REACTIVE PROTEIN: CRP: 1.7 mg/dL (ref 0.5–20.0)

## 2023-10-07 LAB — TSH: TSH: 3.3 u[IU]/mL (ref 0.35–5.50)

## 2023-10-07 LAB — HEMOGLOBIN A1C: Hgb A1c MFr Bld: 6 % (ref 4.6–6.5)

## 2023-10-07 LAB — URIC ACID: Uric Acid, Serum: 6.3 mg/dL (ref 2.4–7.0)

## 2023-10-07 MED ORDER — MUPIROCIN 2 % EX OINT: TOPICAL_OINTMENT | Freq: Two times a day (BID) | CUTANEOUS | 1 refills | Status: DC

## 2023-10-07 MED ORDER — UREA 40 % EX CREA: TOPICAL_CREAM | CUTANEOUS | 1 refills | Status: DC

## 2023-10-07 NOTE — Progress Notes (Signed)
 Pt had apt with PCP this morning. POCT INR was performed by CMA and result sent to coumadin  clinic.  Continue 5 mg (2 tablets) daily except take 2.5 mg (1 tablet) on Sundays and Thursdays and take 3.75 mg (1 1/2 tablets) on Tuesdays. Recheck in 4 weeks.  Contacted pt by phone and advised to continue current dosing. Scheduled for next coumadin  clinic apt. Pt verbalized understanding.

## 2023-10-07 NOTE — Patient Instructions (Addendum)
 Check BP when getting dizzy lightheaded. Checking lab today . For a number of  problems  Consider medication adding. If bp below 100 plan fu  Heart  and lungs  are normal exam today . Not sure about the redness r breast  I  would  advise  we get evaluation  mammogram earlier  and  see GYNE there are no masses today .

## 2023-10-07 NOTE — Patient Instructions (Addendum)
 Pre visit review using our clinic review tool, if applicable. No additional management support is needed unless otherwise documented below in the visit note.  Continue 5 mg (2 tablets) daily except take 2.5 mg (1 tablet) on Sundays and Thursdays and take 3.75 mg (1 1/2 tablets) on Tuesdays. Recheck in 4 weeks.

## 2023-10-07 NOTE — Telephone Encounter (Signed)
 Last Fill: 09/09/2022  Next Visit: 11/19/2023  Last Visit: 08/17/2023  Dx: Rheumatoid arthritis of multiple sites with negative rheumatoid factor   Current Dose per office note on 08/17/2023: folic acid  2 mg by mouth daily   Okay to refill Folic Acid ?

## 2023-10-08 ENCOUNTER — Telehealth: Payer: Self-pay

## 2023-10-08 ENCOUNTER — Other Ambulatory Visit: Payer: Self-pay | Admitting: Internal Medicine

## 2023-10-08 ENCOUNTER — Telehealth: Payer: Self-pay | Admitting: *Deleted

## 2023-10-08 DIAGNOSIS — N6459 Other signs and symptoms in breast: Secondary | ICD-10-CM

## 2023-10-08 NOTE — Telephone Encounter (Signed)
 Duplicate

## 2023-10-08 NOTE — Telephone Encounter (Signed)
 Copied from CRM [id number]. Topic: General - Other >> Oct 07, 2023  2:52 PM Caliyah H wrote: Reason for CRM: Patient called stating she was informed by someone from Central Scheduling that the diagnostic mammogram order needs to be updated to include both breasts, not just the right breast.

## 2023-10-08 NOTE — Telephone Encounter (Signed)
 The order was sent for Diagnostic mammogram Bilateral.

## 2023-10-08 NOTE — Addendum Note (Signed)
 Addended by: Lanessa Shill on: 10/08/2023 09:36 AM   Modules accepted: Orders

## 2023-10-08 NOTE — Telephone Encounter (Signed)
 Copied from CRM [id number]. Topic: General - Other >> Oct 07, 2023  2:52 PM Caliyah H wrote: Reason for CRM: Patient called stating she was informed by someone from Central Scheduling that the diagnostic mammogram order needs to be updated to include both breasts, not just the right breast. >> Oct 08, 2023 12:37 PM Odilia Bennett D wrote: Patient called stating that her diagnostic mammogram was not for both breast it was only for the right breast. I let the patient that the nurse responded to her message on yesterday and it stated it was bilateral

## 2023-10-09 NOTE — Telephone Encounter (Signed)
 Spoke to pt and inform pt that the order was placed. Pt states she is aware and scheduled an appointment for June.    Pt would like to know if provider can sent Xanax  0.25mg  for anxiety, take as needed. Pt states she still has old Rx but it is expired. Pt states she is not in a rush and can wait when provider is back.    Please advise on the Rx.

## 2023-10-12 MED ORDER — ALPRAZOLAM 0.25 MG PO TABS: 0.2500 mg | ORAL_TABLET | Freq: Two times a day (BID) | ORAL | 0 refills | Status: AC | PRN

## 2023-10-12 NOTE — Addendum Note (Signed)
 Addended byReginal Capra on: 10/12/2023 07:28 PM   Modules accepted: Orders

## 2023-10-15 ENCOUNTER — Ambulatory Visit: Payer: Self-pay | Admitting: Internal Medicine

## 2023-10-15 NOTE — Progress Notes (Signed)
 Results atble or in normal range   but cholesterol could be better . Could be related to fact of decreasing  activity  and weight gain with the foot ankle issue.  Attention to healthy eating  still and follow  results to dr D

## 2023-10-19 ENCOUNTER — Ambulatory Visit (INDEPENDENT_AMBULATORY_CARE_PROVIDER_SITE_OTHER): Admitting: Psychology

## 2023-10-19 DIAGNOSIS — Z62820 Parent-biological child conflict: Secondary | ICD-10-CM

## 2023-10-19 DIAGNOSIS — F411 Generalized anxiety disorder: Secondary | ICD-10-CM

## 2023-10-19 NOTE — Progress Notes (Signed)
 10/19/2023  Treatrment Plan:  Diagnosis F41.1 (Generalized anxiety disorder) [n/a]  Z62.820 (Parent child relationship problem) [n/a]  Symptoms Excessive and/or unrealistic worry that is difficult to control occurring more days than not for at least 6 months about a number of events or activities. (Status: maintained) -- No Description Entered  Regularly overindulges their child's wishes and demands. (Status: maintained) -- No Description Entered  Medication Status compliance  Safety none  If Suicidal or Homicidal State Action Taken: unspecified  Current Risk: low Medications unspecified Objectives Related Problem: Achieve a level of competent, effective parenting. Description: Increase the gradual letting go of their adolescent in constructive, affirmative ways. Target Date: 2024-05-14 Frequency: Daily Modality: individual Progress: 85%  Related Problem: Achieve a level of competent, effective parenting. Description: Identify unresolved childhood issues that affect parenting and work toward their resolution. Target Date: 2024-05-14 Frequency: Daily Modality: individual Progress: 90%  Related Problem: Achieve a level of competent, effective parenting. Description: Freely express feelings of frustration, helplessness, and inadequacy that each experiences in the parenting role. Target Date: 2024-05-14 Frequency: Daily Modality: individual Progress: 85%  Related Problem: Resolve the core conflict that is the source of anxiety. Description: Learn and implement problem-solving strategies for realistically addressing worries. Target Date: 2024-05-14 Frequency: Daily Modality: individual Progress:  85%  Related Problem: Resolve the core conflict that is the source of anxiety. Description: Learn and implement calming skills to reduce overall anxiety and manage anxiety symptoms. Target Date: 2024-05-14 Frequency: Daily Modality: individual Progress: 75%  Related Problem: Resolve the core conflict that is the source of anxiety. Description: Describe situations, thoughts, feelings, and actions associated with anxieties and worries, their impact on functioning, and attempts to resolve them. Target Date: 2024-05-14 Frequency: Daily Modality: individual Progress: 75%  Client Response full compliance  Service Location Location, [address] [place], [place] [address]  Service Code cpt [phone]  Behavioral activation plan  Facilitate problem solving  Identify automatic thoughts  Emotion regulation skills  Provide education, information  Self care activities  Relaxation training  Lifestyle change (exercise, nutrition)  Self-monitoring  Validate/empathize  Session notes: F 41.1  Goals: She has chronic anxieties, mostly related to health concerns and her son's well-being. Would like to develop strategies to manage symptoms of anxiety. Needs to understand enmeshment and learn to define and manage appropriate family boundaries. Target date is 12-23. Patient has realized significant improvement in recognizing and establishing appropriate boundaries. Anxiety about health and family matters persist and she desires to continue treatment in an effort to manage and reduce these feelings. Revised target date is 12-25.  Meds: Xanax  (.25 mg);   Patient agrees to a video session and is  aware of the limitations of this platform. She is at home and I am in my home office.   Mckinzi: Marianne Shirts says that her son was in town all week to see his doctors. She recognizes that her son is overly invested in Environmental consultant and is best served to have doctors near where he lives in [place] . She is  gratified to hear her son declare that he needs to make some important changes. [name] was excited that he "matched" with a woman on a dating app, but is concerned about his time management. Mike states that she is working harder to have confidence tha tSoroush will make good choices and to recognize that she cannot "force" change.                                                                                                                         Jola Nash, PhD 10:45a-11:30a 45 min

## 2023-10-21 ENCOUNTER — Ambulatory Visit

## 2023-10-21 ENCOUNTER — Ambulatory Visit
Admission: RE | Admit: 2023-10-21 | Discharge: 2023-10-21 | Disposition: A | Discharge status: Home / Self Care | Source: Ambulatory Visit | Attending: Internal Medicine | Admitting: Internal Medicine

## 2023-10-21 DIAGNOSIS — N6459 Other signs and symptoms in breast: Secondary | ICD-10-CM

## 2023-10-28 ENCOUNTER — Ambulatory Visit: Admitting: Internal Medicine

## 2023-10-28 ENCOUNTER — Encounter: Payer: Self-pay | Admitting: Internal Medicine

## 2023-10-28 VITALS — BP 104/80 | HR 63 | Ht 62.0 in | Wt 165.0 lb

## 2023-10-28 DIAGNOSIS — K5900 Constipation, unspecified: Secondary | ICD-10-CM | POA: Diagnosis not present

## 2023-10-28 DIAGNOSIS — K219 Gastro-esophageal reflux disease without esophagitis: Secondary | ICD-10-CM

## 2023-10-28 DIAGNOSIS — M25473 Effusion, unspecified ankle: Secondary | ICD-10-CM | POA: Diagnosis not present

## 2023-10-28 DIAGNOSIS — Z860101 Personal history of adenomatous and serrated colon polyps: Secondary | ICD-10-CM

## 2023-10-28 DIAGNOSIS — Z860100 Personal history of colon polyps, unspecified: Secondary | ICD-10-CM

## 2023-10-28 DIAGNOSIS — Z7901 Long term (current) use of anticoagulants: Secondary | ICD-10-CM

## 2023-10-28 NOTE — Patient Instructions (Signed)
 Please follow up as needed.  _______________________________________________________  If your blood pressure at your visit was 140/90 or greater, please contact your primary care physician to follow up on this.  _______________________________________________________  If you are age 65 or older, your body mass index should be between 23-30. Your Body mass index is 30.18 kg/m. If this is out of the aforementioned range listed, please consider follow up with your Primary Care Provider.  If you are age 31 or younger, your body mass index should be between 19-25. Your Body mass index is 30.18 kg/m. If this is out of the aformentioned range listed, please consider follow up with your Primary Care Provider.   ________________________________________________________  The [facility] providers would like to encourage you to use MYCHART to communicate with providers for non-urgent requests or questions.  Due to long hold times on the telephone, sending your provider a message by [facility] may be a faster and more efficient way to get a response.  Please allow 48 business hours for a response.  Please remember that this is for non-urgent requests.  _______________________________________________________

## 2023-10-28 NOTE — Progress Notes (Signed)
 HISTORY OF PRESENT ILLNESS:  [name] is a 63 y.o. female [place] female with rheumatoid arthritis, hypercoagulable disorder (evaluated at [facility] 2015), history of bilateral pulmonary embolism, on chronic anticoagulation therapy, and other general medical problems as listed below.  I have taken care of her father in the hospital in the past.  [name].  Previous Dr. Andriette [name].  She presents today regarding constipation and issues with acid reflux.  The patient last underwent colonoscopy July 19, 2021.  She was found to have a tubular adenoma.  The exam was otherwise normal.  Follow-up in 7 years recommended.  The patient tells me that she has had some issues with constipation for about 18 months.  She has been using MiraLAX every other day with good results.  She wants to be reassured that this is not an alarm feature.  She has no associated abdominal pain.  No bleeding.  No unexplained weight loss.  Noncontrast CT scan of the abdomen and pelvis December 2024 was unremarkable except for a simple appearing right-sided renal cyst.  Next, she tells me that she has been taking her omeprazole  20 mg every other day.  This results in poorly controlled reflux.  Once daily therapy works.  She has been using Pepcid intermittently, this does not work.  Her main concern is effects on bone density.  She remains on chronic Coumadin .  She asked about ankle swelling  Blood work from Oct 07, 2023 shows normal comprehensive metabolic panel.  Normal CBC with hemoglobin 14.1.  Normal iron studies.  REVIEW OF SYSTEMS:  All non-GI ROS negative unless otherwise stated in the HPI except for arthritis, back pain, ankle swelling  Past Medical History:  Diagnosis Date   Adjustment disorder with anxiety    B12 deficiency due to diet    is a vegetarian   Chest pain, unspecified    Chronic anticoagulation    Fatty liver    Per patient   GERD (gastroesophageal reflux disease)     Headache(784.0)    migraines and head pain   Hx of varicella    As Child   Iron deficiency anemia, unspecified    Otalgia, unspecified    Other and unspecified hyperlipidemia    Palpitations    Personal history of venous thrombosis and embolism    Recurrent UTI    Rheumatoid arthritis(714.0)    deveshewar   Unspecified vitamin D  deficiency    UTI (lower urinary tract infection) 11/28/2011   citrobacter       Past Surgical History:  Procedure Laterality Date   ORIF ULNAR FRACTURE     TYMPANOSTOMY TUBE PLACEMENT  2009   Right    Social History [name]  reports that she has never smoked. She has been exposed to tobacco smoke. She has never used smokeless tobacco. She reports that she does not drink alcohol and does not use drugs.  family history includes Dementia in her father; Diabetes in her father; Hyperlipidemia in her father; Lymphoma in an other family member; Parkinsonism in her mother; Stroke in her father.  Allergies  Allergen Reactions   Boniva [Ibandronate Sodium]     Heartburn   Gadolinium Derivatives     Other Reaction(s): Not available, Not available   Ivp Dye [Iodinated Contrast Media] Swelling    Mouth swelling.   Other Swelling    Mouth swelling. Tongue    Risedronate Sodium Other (See Comments)    Foot and bone pain and fatigue Foot and  bone pain and fatigue   Risedronate Sodium [Risedronate Sodium] Other (See Comments)    Foot and bone pain and fatigue   Macrobid  [Nitrofurantoin ] Rash       PHYSICAL EXAMINATION: Vital signs: BP 104/80   Pulse 63   Ht 5' 2 (1.575 m)   Wt 165 lb (74.8 kg)   BMI 30.18 kg/m   Constitutional: generally well-appearing, no acute distress Psychiatric: alert and oriented x3, cooperative Eyes: extraocular movements intact, anicteric, conjunctiva pink Mouth: Mask ask Neck: supple no lymphadenopathy Cardiovascular: heart regular rate and rhythm, no murmur Lungs: clear to auscultation bilaterally Abdomen:  soft, nontender, nondistended, no obvious ascites, no peritoneal signs, normal bowel sounds, no organomegaly Rectal: Omitted Extremities: no clubbing or cyanosis trace pedal lower extremity edema on the left ankle Skin: no lesions on visible extremities Neuro: No focal deficits.  Cranial nerve intact  ASSESSMENT:  1.  Functional constipation.  Managed with MiraLAX 2.  GERD.  Incompletely controlled on every other day PPI 3.  Chronic anticoagulation therapy 4.  History of adenomatous colon polyp March 2023 5.  Mild ankle swelling.  See PCP  PLAN:  1.  Reassurance 2.  Continue MiraLAX.  Titrate to achieve desired result 3.  Reflux precautions 4.  Take omeprazole  20 mg daily.  Reassured her there are no significant negative effects on bone based on the best most recent studies. 5.  Also, inform patient that there are studies showing the patient's 65 and older with comorbidities on chronic anticoagulation therapy benefit from PPI therapy in terms of reduction of risks for GI bleeding. 6.  Routine surveillance colonoscopy March 2030 7.  Return to PCP.  Interval follow-up as needed Total time of 40 minutes was spent preparing to see the patient, obtaining comprehensive history, performing medically appropriate physical examination, counseling and educating the patient regarding the above listed issues, directing her medical therapy and symptomatic therapies, and documenting clinical information in the health record

## 2023-11-02 ENCOUNTER — Ambulatory Visit (INDEPENDENT_AMBULATORY_CARE_PROVIDER_SITE_OTHER): Admitting: Psychology

## 2023-11-02 DIAGNOSIS — F411 Generalized anxiety disorder: Secondary | ICD-10-CM

## 2023-11-02 DIAGNOSIS — Z62820 Parent-biological child conflict: Secondary | ICD-10-CM
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
## 2023-11-04 ENCOUNTER — Ambulatory Visit (INDEPENDENT_AMBULATORY_CARE_PROVIDER_SITE_OTHER)

## 2023-11-04 DIAGNOSIS — Z7901 Long term (current) use of anticoagulants: Secondary | ICD-10-CM

## 2023-11-04 LAB — POCT INR: INR: 1.5 — AB (ref 2.0–3.0)

## 2023-11-04 NOTE — Progress Notes (Signed)
 Continue 5 mg (2 tablets) daily except take 2.5 mg (1 tablet) on Sundays and Thursdays and take 3.75 mg (1 1/2 tablets) on Tuesdays. Recheck in 4 weeks.

## 2023-11-04 NOTE — Patient Instructions (Addendum)
 Pre visit review using our clinic review tool, if applicable. No additional management support is needed unless otherwise documented below in the visit note.  Continue 5 mg (2 tablets) daily except take 2.5 mg (1 tablet) on Sundays and Thursdays and take 3.75 mg (1 1/2 tablets) on Tuesdays. Recheck in 4 weeks.

## 2023-11-05 NOTE — Progress Notes (Signed)
 Office Visit Note  Patient: [name]             Date of Birth: [birth year]           MRN: [id number]             PCP: Charlett Apolinar POUR, MD Referring: Charlett Apolinar POUR, MD Visit Date: 11/19/2023 Occupation: @GUAROCC @  Subjective:  Medication management  History of Present Illness: Shalae Belmonte is a 63 y.o. female seronegative rheumatoid arthritis, osteoarthritis, degenerative disc disease and osteoporosis.  She returns today after her last visit in March 2025.  She states she has been having pain in her left ankle for which she was evaluated by Dr. Kit.  She had MRI of her left ankle which showed mild Achilles tendinitis.Patient was evaluated by Dr. Kit on Oct 16, 2023.  According to the note she was diagnosed with insertional Achilles tendinopathy.  She had inotopheresis and physical therapy.  She continues to have some discomfort in her right knee and lower back.  She has been doing stretching exercises.  She continues to take methotrexate  8 tablets p.o. weekly along with folic acid  2 mg daily and hydroxychloroquine  200 mg p.o. twice daily Monday to Friday without any interruption.  Activities of Daily Living:  Patient reports morning stiffness for 0 minutes.   Patient Denies nocturnal pain.  Difficulty dressing/grooming: Denies Difficulty climbing stairs: Denies Difficulty getting out of chair: Denies Difficulty using hands for taps, buttons, cutlery, and/or writing: Denies  Review of Systems  Constitutional:  Negative for fatigue.  HENT:  Positive for mouth dryness. Negative for mouth sores.   Eyes:  Positive for dryness.  Respiratory:  Negative for shortness of breath.   Cardiovascular:  Negative for chest pain and palpitations.  Gastrointestinal:  Positive for constipation. Negative for blood in stool and diarrhea.  Endocrine: Positive for increased urination.  Genitourinary:  Negative for involuntary urination.  Musculoskeletal:  Positive for joint pain and  joint pain. Negative for gait problem, joint swelling, myalgias, muscle weakness, morning stiffness, muscle tenderness and myalgias.  Skin:  Negative for color change, rash, hair loss and sensitivity to sunlight.  Allergic/Immunologic: Positive for susceptible to infections.  Neurological:  Positive for dizziness. Negative for headaches.  Hematological:  Negative for swollen glands.  Psychiatric/Behavioral:  Negative for depressed mood and sleep disturbance. The patient is nervous/anxious.     PMFS History:  Patient Active Problem List   Diagnosis Date Noted   Lumbosacral radiculopathy at L5 10/08/2022   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HCC) 01/06/2018   Long term (current) use of anticoagulants 02/11/2017   Sicca syndrome (HCC) 12/22/2016   ANA positive 12/22/2016   Raised intraocular pressure of both eyes 12/22/2016   Migraine 03/31/2016   Anxiety 03/31/2016   High risk medication use 03/31/2016   Primary osteoarthritis of both feet 03/31/2016   Primary osteoarthritis of both knees 03/31/2016   Stiffness of left hand joint 11/27/2014   Elevated IOP 06/09/2014   Long term current use of systemic steroids 06/09/2014   Medically complex patient 06/09/2014   Current use of steroid medication 01/09/2014   Xerosis of skin 12/07/2013   Encounter for therapeutic drug monitoring 06/09/2013   B12 deficiency 05/31/2013   Iron deficiency 05/31/2013   Visit for preventive health examination 05/25/2012   Mammogram abnormal 04/12/2012   Iliac crest  pain 01/23/2012   Vaginal atrophy 01/23/2012   History of recurrent UTI (urinary tract infection) 01/23/2012   Osteoporosis 04/29/2011   Nonspecific abnormal  results of liver function study 12/08/2010   Chronic anticoagulation    Anticoagulant long-term use 07/15/2010   Anticardiolipin antibody positive 07/15/2010   Chronic cough 03/19/2009   CHEST PAIN-UNSPECIFIED 08/26/2008   PALPITATIONS, RECURRENT 07/24/2008   Vitamin D  deficiency  05/30/2008   HYPERLIPIDEMIA 05/30/2008   IRON DEFICIENCY 05/30/2008   Adjustment disorder with anxiety 05/30/2008   Rheumatoid arthritis (HCC) 05/30/2008   PULMONARY EMBOLISM, HX OF 05/30/2008    Past Medical History:  Diagnosis Date   Adjustment disorder with anxiety    B12 deficiency due to diet    is a vegetarian   Chest pain, unspecified    Chronic anticoagulation    Fatty liver    Per patient   GERD (gastroesophageal reflux disease)    Headache(784.0)    migraines and head pain   Hx of varicella    As Child   Iron deficiency anemia, unspecified    Otalgia, unspecified    Other and unspecified hyperlipidemia    Palpitations    Personal history of venous thrombosis and embolism    Recurrent UTI    Rheumatoid arthritis(714.0)    deveshewar   Unspecified vitamin D  deficiency    UTI (lower urinary tract infection) 11/28/2011   citrobacter       Family History  Problem Relation Age of Onset   Parkinsonism Mother    Dementia Father    Stroke Father    Hyperlipidemia Father    Diabetes Father    Lymphoma Other    Colon cancer Neg Hx    Colon polyps Neg Hx    Esophageal cancer Neg Hx    Stomach cancer Neg Hx    Rectal cancer Neg Hx    Breast cancer Neg Hx    Past Surgical History:  Procedure Laterality Date   ORIF ULNAR FRACTURE     TYMPANOSTOMY TUBE PLACEMENT  2009   Right   Social History   Social History Narrative   Left handed   Caffeine use: Tea daily, coffee once a week   Regular [place]Married HH  of 3 G1P1   Immunization History  Administered Date(s) Administered   Influenza, Seasonal, Injecte, Preservative Fre 03/03/2023   Influenza,inj,Quad PF,6+ Mos 04/19/2013, 06/27/2015, 03/05/2016, 02/11/2017, 03/15/2018, 02/23/2019, 04/02/2020, 03/13/2021, 02/26/2022   PFIZER(Purple Top)SARS-COV-2 Vaccination 08/06/2019, 08/25/2019, 01/12/2020, 11/10/2020   PPD Test 07/21/2016   Tdap 05/31/2013   Zoster  Recombinant(Shingrix) 01/23/2021, 06/11/2021     Objective: Vital Signs: BP 97/64 (BP Location: Right Arm, Patient Position: Sitting, Cuff Size: Small)   Pulse (!) 56   Resp 13   Ht 5' 2.5 (1.588 m)   Wt 164 lb 6.4 oz (74.6 kg)   BMI 29.59 kg/m    Physical Exam Vitals and nursing note reviewed.  Constitutional:      Appearance: She is well-developed.  HENT:     Head: Normocephalic and atraumatic.  Eyes:     Conjunctiva/sclera: Conjunctivae normal.  Cardiovascular:     Rate and Rhythm: Normal rate and regular rhythm.     Heart sounds: Normal heart sounds.  Pulmonary:     Effort: Pulmonary effort is normal.     Breath sounds: Normal breath sounds.  Abdominal:     General: Bowel sounds are normal.     Palpations: Abdomen is soft.  Musculoskeletal:     Cervical back: Normal range of motion.  Lymphadenopathy:     Cervical: No cervical adenopathy.  Skin:    General: Skin is  warm and dry.     Capillary Refill: Capillary refill takes less than 2 seconds.  Neurological:     Mental Status: She is alert and oriented to person, place, and time.  Psychiatric:        Behavior: Behavior normal.      Musculoskeletal Exam: Cervical spine limited range of motion.  Thoracic kyphosis with no tenderness was noted.  There was no tenderness over lumbar spine.  Shoulders, elbows, wrist joints, MCPs PIPs and DIPs been good range of motion with no synovitis.  Hip joints and knee joints in good range of motion without any warmth swelling or effusion.  There was no tenderness over ankles or MTPs.  CDAI Exam: CDAI Score: -- Patient Global: --; Provider Global: -- Swollen: --; Tender: -- Joint Exam 11/19/2023   No joint exam has been documented for this visit   There is currently no information documented on the homunculus. Go to the Rheumatology activity and complete the homunculus joint exam.  Investigation: No additional findings.  Imaging: MM 3D DIAGNOSTIC MAMMOGRAM BILATERAL  BREAST Result Date: 10/21/2023 CLINICAL DATA:  Patient presents with a chronic skin discoloration in the lateral aspects of both breasts. No raise lesions. No underlying lumps. EXAM: DIGITAL DIAGNOSTIC BILATERAL MAMMOGRAM WITH TOMOSYNTHESIS AND CAD TECHNIQUE: Bilateral digital diagnostic mammography and breast tomosynthesis was performed. The images were evaluated with computer-aided detection. COMPARISON:  Previous exam(s). ACR Breast Density Category b: There are scattered areas of fibroglandular density. FINDINGS: There are no breast masses, areas of significant asymmetry, areas of architectural distortion or suspicious calcifications. No mammographic change. No visible skin thickening. On physical exam, there is subtle darker coloration skin along the lower outer aspects of both breasts that is relatively symmetric. IMPRESSION: 1. No evidence of breast malignancy. RECOMMENDATION: 1.  Screening mammogram in one year.(Code:SM-B-01Y) 2. Consider dermatologic follow-up for reported skin changes. I have discussed the findings and recommendations with the patient. If applicable, a reminder letter will be sent to the patient regarding the next appointment. BI-RADS CATEGORY  1: Negative. Electronically Signed   By: Alm Parkins M.D.   On: 10/21/2023 09:41   IMPRESSION: Very mild insertional tendinosis of the Achilles tendon. No significant tendinosis or tear.Remote foreign body in the posterior body of the calcaneus without aggressive features. Otherwise, unremarkable MRI of the left ankle. Electronically signed by: Norleen Satchel MD 09/30/2023 11:35 AM EDT RP Workstation: [id number]  Recent Labs: Lab Results  Component Value Date   WBC 6.9 10/07/2023   HGB 14.1 10/07/2023   PLT 225.0 10/07/2023   NA 139 10/07/2023   K 4.3 10/07/2023   CL 106 10/07/2023   CO2 26 10/07/2023   GLUCOSE 94 10/07/2023   BUN 9 10/07/2023   CREATININE 0.86 10/07/2023   BILITOT 0.7 10/07/2023   ALKPHOS 86 10/07/2023    AST 22 10/07/2023   ALT 14 10/07/2023   PROT 7.3 10/07/2023   ALBUMIN 4.3 10/07/2023   CALCIUM 9.0 10/07/2023   GFRAA 96 07/26/2020   QFTBGOLDPLUS NEGATIVE 09/12/2020    Speciality Comments: PLQ Eye Exam: 02/26/2023 WNL @ [facility] Follow up 1 year Ttd with Fosamax  for 5 years in the past Fosamax  restarted 05/2020  Procedures:  No procedures performed Allergies: Boniva [ibandronate sodium], Gadolinium derivatives, Ivp dye [iodinated contrast media], Other, Risedronate sodium, Risedronate sodium [risedronate sodium], and Macrobid  [nitrofurantoin ]   Assessment / Plan:     Visit Diagnoses: Rheumatoid arthritis of multiple sites 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  She has been taking methotrexate  and Plaquenil  on a regular basis without any interruption.  High risk medication use - Methotrexate  8 tablets p.o. weekly, folic acid  2 mg p.o. daily, Plaquenil  200 mg p.o. twice daily Monday to Friday, Plaquenil  eye exam February 26, 2023.  Labs from Oct 07, 2023 were reviewed.  CBC and CMP were normal.  She was advised to get labs every 3 months.  Information reimmunization was placed in the AVS.  She was advised to hold methotrexate  if she develops an infection.  Sicca syndrome (HCC)-over-the-counter products were discussed.  Chronic pain of both shoulders-she had good range of motion without any discomfort today.  Primary osteoarthritis of both knees -she continues to have intermittent discomfort in her right knee joint.  No warmth swelling or effusion was noted.  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  Chronic pain of left ankle - Followed by Dr. Kit.  She was diagnosed with chronic Achilles tendinopathy.  MRI of the ankle joint was reviewed with the patient.  She had ionotopheresis and physical therapy which has been  helpful.  Primary osteoarthritis of both feet  Spondylosis of lumbar spine -she has intermittent discomfort in her lower back.  She is followed by  Dr. Joshua and Dr. Bonner  Age related osteoporosis, unspecified pathological fracture presence - 08/11/22:The BMD Femur Neck Left is 0.656 g/cm2 with a T-score of -2.7.She restarted Fosamax  in January 2022 and discontinued in January 2025.  Patient states she had GI side effects from Fosamax  which were manageable when she took Prilosec.  I did detailed discussion with the patient again and discussed the possible use of Tymlos or Forteo.  She would not be able to use Evenity with a history of pulmonary embolism.  She is on chronic anticoagulation.  Patient would like to review Tymlos and will decide.  If she is stays on Tymlos for the next 2 years then she will have to go on bisphosphonates after finishing the course of Tymlos.  History of pulmonary embolism-she remains on Coumadin .  Current use of long-term anticoagulation-on Coumadin   Other medical problems are listed as follows:  Fatty liver  History of hyperlipidemia  Raised intraocular pressure of both eyes  History of migraine  Frequent UTI  Orders: No orders of the defined types were placed in this encounter.  No orders of the defined types were placed in this encounter.    Follow-Up Instructions: Return in about 3 months (around 02/19/2024) for Rheumatoid arthritis.   Maya Nash, MD  Note - This record has been created using Animal nutritionist.  Chart creation errors have been sought, but may not always  have been located. Such creation errors do not reflect on  the standard of medical care.

## 2023-11-12 ENCOUNTER — Ambulatory Visit

## 2023-11-13 ENCOUNTER — Other Ambulatory Visit: Payer: Self-pay | Admitting: Internal Medicine

## 2023-11-16 ENCOUNTER — Ambulatory Visit: Admitting: Psychology

## 2023-11-16 ENCOUNTER — Ambulatory Visit (INDEPENDENT_AMBULATORY_CARE_PROVIDER_SITE_OTHER): Admitting: Psychology

## 2023-11-16 DIAGNOSIS — F411 Generalized anxiety disorder: Secondary | ICD-10-CM | POA: Diagnosis not present

## 2023-11-16 NOTE — Progress Notes (Signed)
 11/16/2023  Treatrment Plan:  Diagnosis F41.1 (Generalized anxiety disorder) [n/a]  Z62.820 (Parent child relationship problem) [n/a]  Symptoms Excessive and/or unrealistic worry that is difficult to control occurring more days than not for at least 6 months about a number of events or activities. (Status: maintained) -- No Description Entered  Regularly overindulges their child's wishes and demands. (Status: maintained) -- No Description Entered  Medication Status compliance  Safety none  If Suicidal or Homicidal State Action Taken: unspecified  Current Risk: low Medications unspecified Objectives Related Problem: Achieve a level of competent, effective parenting. Description: Increase the gradual letting go of their adolescent in constructive, affirmative ways. Target Date: 2024-05-14 Frequency: Daily Modality: individual Progress: 85%  Related Problem: Achieve a level of competent, effective parenting. Description: Identify unresolved childhood issues that affect parenting and work toward their resolution. Target Date: 2024-05-14 Frequency: Daily Modality: individual Progress: 90%  Related Problem: Achieve a level of competent, effective parenting. Description: Freely express feelings of frustration, helplessness, and inadequacy that each experiences in the parenting role. Target Date: 2024-05-14 Frequency: Daily Modality: individual Progress: 85%  Related Problem: Resolve the core conflict that is the source of anxiety. Description: Learn and implement problem-solving strategies for realistically addressing worries. Target Date:  2024-05-14 Frequency: Daily Modality: individual Progress: 85%  Related Problem: Resolve the core conflict that is the source of anxiety. Description: Learn and implement calming skills to reduce overall anxiety and manage anxiety symptoms. Target Date: 2024-05-14 Frequency: Daily Modality: individual Progress: 75%  Related Problem: Resolve the core conflict that is the source of anxiety. Description: Describe situations, thoughts, feelings, and actions associated with anxieties and worries, their impact on functioning, and attempts to resolve them. Target Date: 2024-05-14 Frequency: Daily Modality: individual Progress: 75%  Client Response full compliance  Service Location Location, [address] [place], [place] [address]  Service Code cpt [phone]  Behavioral activation plan  Facilitate problem solving  Identify automatic thoughts  Emotion regulation skills  Provide education, information  Self care activities  Relaxation training  Lifestyle change (exercise, nutrition)  Self-monitoring  Validate/empathize  Session notes: F 41.1  Goals: She has chronic anxieties, mostly related to health concerns and her son's well-being. Would like to develop strategies to manage symptoms of anxiety. Needs to understand enmeshment and learn to define and manage appropriate family boundaries. Target date is 12-23. Patient has realized significant improvement in recognizing and establishing appropriate boundaries. Anxiety about health and family matters persist and she desires to continue  treatment in an effort to manage and reduce these feelings. Revised target date is 12-25.  Meds: Xanax  (.25 mg);   Patient agrees to a video session and is aware of the limitations of this platform. She is at home and I am in my home office.   Jackelin: [name] says she has been talking to family in [place] and everyone is currently okay. She is concerned that her son is depressed because his dating  efforts have not yielded and positive experiences. Talked about her anxiety to travel and fears about how she will be treated by others. She recognizes that many of her fears are irrational, but she does not trust that her status of being a citizen will protect her. She knows that there is profiling and she is subject to being treated unfairly. Talked about managing her multiple fears and taking calculated risks.                                                                                                                              CONI ALM KERNS, PhD 10:40a-11:30a 50 min

## 2023-11-19 ENCOUNTER — Ambulatory Visit: Attending: Rheumatology | Admitting: Rheumatology

## 2023-11-19 ENCOUNTER — Encounter: Payer: Self-pay | Admitting: Rheumatology

## 2023-11-19 VITALS — BP 97/64 | HR 56 | Resp 13 | Ht 62.5 in | Wt 164.4 lb

## 2023-11-19 DIAGNOSIS — M810 Age-related osteoporosis without current pathological fracture: Secondary | ICD-10-CM

## 2023-11-19 DIAGNOSIS — Z79899 Other long term (current) drug therapy: Secondary | ICD-10-CM

## 2023-11-19 DIAGNOSIS — M0609 Rheumatoid arthritis without rheumatoid factor, multiple sites: Secondary | ICD-10-CM

## 2023-11-19 DIAGNOSIS — H40053 Ocular hypertension, bilateral: Secondary | ICD-10-CM

## 2023-11-19 DIAGNOSIS — M25511 Pain in right shoulder: Secondary | ICD-10-CM | POA: Diagnosis not present

## 2023-11-19 DIAGNOSIS — Z86711 Personal history of pulmonary embolism: Secondary | ICD-10-CM

## 2023-11-19 DIAGNOSIS — G8929 Other chronic pain: Secondary | ICD-10-CM

## 2023-11-19 DIAGNOSIS — M19072 Primary osteoarthritis, left ankle and foot: Secondary | ICD-10-CM

## 2023-11-19 DIAGNOSIS — Z8669 Personal history of other diseases of the nervous system and sense organs: Secondary | ICD-10-CM

## 2023-11-19 DIAGNOSIS — Z8639 Personal history of other endocrine, nutritional and metabolic disease: Secondary | ICD-10-CM

## 2023-11-19 DIAGNOSIS — M25512 Pain in left shoulder: Secondary | ICD-10-CM

## 2023-11-19 DIAGNOSIS — K760 Fatty (change of) liver, not elsewhere classified: Secondary | ICD-10-CM

## 2023-11-19 DIAGNOSIS — M3500 Sicca syndrome, unspecified: Secondary | ICD-10-CM

## 2023-11-19 DIAGNOSIS — M47816 Spondylosis without myelopathy or radiculopathy, lumbar region: Secondary | ICD-10-CM

## 2023-11-19 DIAGNOSIS — N390 Urinary tract infection, site not specified: Secondary | ICD-10-CM

## 2023-11-19 DIAGNOSIS — M19071 Primary osteoarthritis, right ankle and foot: Secondary | ICD-10-CM

## 2023-11-19 DIAGNOSIS — Z7901 Long term (current) use of anticoagulants: Secondary | ICD-10-CM

## 2023-11-19 DIAGNOSIS — M170 Bilateral primary osteoarthritis of knee: Secondary | ICD-10-CM

## 2023-11-19 DIAGNOSIS — M25572 Pain in left ankle and joints of left foot: Secondary | ICD-10-CM

## 2023-11-19 NOTE — Patient Instructions (Addendum)
 Standing Labs We placed an order today for your standing lab work.   Please have your standing labs drawn in August and every 3 months  Please have your labs drawn 2 weeks prior to your appointment so that the provider can discuss your lab results at your appointment, if possible.  Please note that you may see your imaging and lab results in MyChart before we have reviewed them. We will contact you once all results are reviewed. Please allow our office up to 72 hours to thoroughly review all of the results before contacting the office for clarification of your results.  WALK-IN LAB HOURS  Monday through Thursday from 8:00 am -12:30 pm and 1:00 pm-4:30 pm and Friday from 8:00 am-12:00 pm.  Patients with office visits requiring labs will be seen before walk-in labs.  You may encounter longer than normal wait times. Please allow additional time. Wait times may be shorter on  Monday and Thursday afternoons.  We do not book appointments for walk-in labs. We appreciate your patience and understanding with our staff.   Labs are drawn by [facility]. Please bring your co-pay at the time of your lab draw.  You may receive a bill from [facility] for your lab work.  Please note if you are on Hydroxychloroquine  and and an order has been placed for a Hydroxychloroquine  level,  you will need to have it drawn 4 hours or more after your last dose.  If you wish to have your labs drawn at another location, please call the office 24 hours in advance so we can fax the orders.  The office is located at [address], [place], [place] [address]   If you have any questions regarding directions or hours of operation,  please call [phone].   As a reminder, please drink plenty of water prior to coming for your lab work. Thanks!   Vaccines You are taking a medication(s) that can suppress your immune system.  The following immunizations are recommended: Flu annually Covid-19  Td/Tdap (tetanus,  diphtheria, pertussis) every 10 years Pneumonia (Prevnar 15 then Pneumovax 23 at least 1 year apart.  Alternatively, can take Prevnar 20 without needing additional dose) Shingrix: 2 doses from 4 weeks to 6 months apart  Please check with your PCP to make sure you are up to date. If you have signs or symptoms of an infection or start antibiotics: First, call your PCP for workup of your infection. Hold your medication through the infection, until you complete your antibiotics, and until symptoms resolve if you take the following: Injectable medication (Actemra, Benlysta, Cimzia, Cosentyx, Enbrel, Humira, Kevzara, Orencia, Remicade, Simponi, Stelara, Taltz, Tremfya) Methotrexate  Leflunomide (Arava) Mycophenolate (Cellcept) Earma, Olumiant, or Rinvoq   Abaloparatide Injection What is this medication? ABALOPARATIDE (a ball oh PAR a tide) treats osteoporosis. It works by Interior and spatial designer stronger and less likely to break (fracture). This medicine may be used for other purposes; ask your health care provider or pharmacist if you have questions. COMMON BRAND NAME(S): TYMLOS What should I tell my care team before I take this medication? They need to know if you have any of these conditions: Bone disease other than osteoporosis High levels of an enzyme called alkaline phosphatase in the blood High levels of calcium in the blood History of cancer in the bone Kidney stone Paget's disease Parathyroid disease Receiving radiation therapy An unusual or allergic reaction to abaloparatide, other medications, foods, dyes, or preservatives Pregnant or trying to get pregnant Breast-feeding How should  I use this medication? This medication is injected under the skin. You will be taught how to prepare and give it. Take it as directed on the prescription label at the same time every day. Keep taking it unless your care team tells you to stop. It is important that you put your used needles and pens in a  special sharps container. Do not put them in a trash can. If you do not have a sharps container, call your pharmacist or care team to get one. A special MedGuide will be given to you by the pharmacist with each prescription and refill. Be sure to read this information carefully each time. Talk to your care team about the use of this medication in children. Special care may be needed. Overdosage: If you think you have taken too much of this medicine contact a poison control center or emergency room at once. NOTE: This medicine is only for you. Do not share this medicine with others. What if I miss a dose? If you miss a dose, take it as soon as you can. If it is almost time for your next dose, take only that dose. Do not take double or extra doses. What may interact with this medication? Interactions have not been studied. This list may not describe all possible interactions. Give your health care provider a list of all the medicines, herbs, non-prescription drugs, or dietary supplements you use. Also tell them if you smoke, drink alcohol, or use illegal drugs. Some items may interact with your medicine. What should I watch for while using this medication? Visit your care team for regular checks on your progress. You may need blood work done while you are taking this medication. This medication may affect your coordination, reaction time, or judgment. Do not drive or operate machinery until you know how this medication affects you. Sit up or stand slowly to reduce the risk of dizzy or fainting spells. Drinking alcohol with this medication can increase the risk of these side effects. You should make sure that you get enough calcium and vitamin D  while you are taking this medication. Discuss the foods you eat and the vitamins you take with your care team. Talk to your care team about your risk of cancer. You may be more at risk for certain types of cancers if you take this medication. Do not share pens  with anyone, even if the needle is changed. Each pen should only be used by one person. Sharing could cause an infection. What side effects may I notice from receiving this medication? Side effects that you should report to your care team as soon as possible: Allergic reactions--skin rash, itching, hives, swelling of the face, lips, tongue, or throat High calcium level--increased thirst or amount of urine, nausea, vomiting, confusion, unusual weakness or fatigue, bone pain Kidney stones--blood in the urine, pain or trouble passing urine, pain in the lower back or sides Low blood pressure--dizziness, feeling faint or lightheaded, blurry vision Side effects that usually do not require medical attention (report these to your care team if they continue or are bothersome): Dizziness Fatigue Headache Nausea Pain, redness, or irritation at injection site This list may not describe all possible side effects. Call your doctor for medical advice about side effects. You may report side effects to FDA at 1-800-FDA-1088. Where should I keep my medication? Keep out of the reach of children and pets. Store unopened pens in the refrigerator between 2 and 8 degrees C (36 and 46 degrees F).  Do not freeze. Avoid exposure to heat. Get rid of any unopened medication after the expiration date. After the first use, store your pen for up to 30 days at room temperature between 20 and 25 degrees C (68 and 77 degrees F). Do not store with a needle attached to the pen. Use the pen for only 30 days. Get rid of your pen 30 days after first opening it even if the pen still contains medication. To get rid of medications that are no longer needed or have expired: Take the medication to a medication take-back program. Check with your pharmacy or law enforcement to find a location. If you cannot return the medication, ask your pharmacist or care team how to get rid of this medication safely. NOTE: This sheet is a summary. It may  not cover all possible information. If you have questions about this medicine, talk to your doctor, pharmacist, or health care provider.  2024 Elsevier/Gold Standard (2022-08-01 00:00:00)

## 2023-11-27 ENCOUNTER — Other Ambulatory Visit: Payer: Self-pay | Admitting: Rheumatology

## 2023-11-27 NOTE — Telephone Encounter (Signed)
 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 twice daily Monday to Friday   Okay to refill Plaquenil ?

## 2023-11-30 ENCOUNTER — Ambulatory Visit: Admitting: Psychology

## 2023-12-01 ENCOUNTER — Ambulatory Visit (INDEPENDENT_AMBULATORY_CARE_PROVIDER_SITE_OTHER): Admitting: Psychology

## 2023-12-01 DIAGNOSIS — F411 Generalized anxiety disorder: Secondary | ICD-10-CM

## 2023-12-01 NOTE — Progress Notes (Signed)
 12/01/2023  Treatrment Plan:  Diagnosis F41.1 (Generalized anxiety disorder) [n/a]  Z62.820 (Parent child relationship problem) [n/a]  Symptoms Excessive and/or unrealistic worry that is difficult to control occurring more days than not for at least 6 months about a number of events or activities. (Status: maintained) -- No Description Entered  Regularly overindulges their child's wishes and demands. (Status: maintained) -- No Description Entered  Medication Status compliance  Safety none  If Suicidal or Homicidal State Action Taken: unspecified  Current Risk: low Medications unspecified Objectives Related Problem: Achieve a level of competent, effective parenting. Description: Increase the gradual letting go of their adolescent in constructive, affirmative ways. Target Date: 2024-05-14 Frequency: Daily Modality: individual Progress: 85%  Related Problem: Achieve a level of competent, effective parenting. Description: Identify unresolved childhood issues that affect parenting and work toward their resolution. Target Date: 2024-05-14 Frequency: Daily Modality: individual Progress: 90%  Related Problem: Achieve a level of competent, effective parenting. Description: Freely express feelings of frustration, helplessness, and inadequacy that each experiences in the parenting role. Target Date: 2024-05-14 Frequency: Daily Modality: individual Progress: 85%  Related Problem: Resolve the core conflict that is the source of anxiety. Description: Learn and implement problem-solving strategies for realistically  addressing worries. Target Date: 2024-05-14 Frequency: Daily Modality: individual Progress: 85%  Related Problem: Resolve the core conflict that is the source of anxiety. Description: Learn and implement calming skills to reduce overall anxiety and manage anxiety symptoms. Target Date: 2024-05-14 Frequency: Daily Modality: individual Progress: 75%  Related Problem: Resolve the core conflict that is the source of anxiety. Description: Describe situations, thoughts, feelings, and actions associated with anxieties and worries, their impact on functioning, and attempts to resolve them. Target Date: 2024-05-14 Frequency: Daily Modality: individual Progress: 75%  Client Response full compliance  Service Location Location, [address] [place], [place] [address]  Service Code cpt [phone]  Behavioral activation plan  Facilitate problem solving  Identify automatic thoughts  Emotion regulation skills  Provide education, information  Self care activities  Relaxation training  Lifestyle change (exercise, nutrition)  Self-monitoring  Validate/empathize  Session notes: F 41.1  Goals: She has chronic anxieties, mostly related to health concerns and her son's well-being. Would like to develop strategies to manage symptoms of anxiety. Needs to understand enmeshment and learn to define and manage appropriate family boundaries. Target date is 12-23. Patient has realized significant improvement in recognizing and  establishing appropriate boundaries. Anxiety about health and family matters persist and she desires to continue treatment in an effort to manage and reduce these feelings. Revised target date is 12-25.  Meds: Xanax  (.25 mg);   Patient agrees to a video session and is aware of the limitations of this platform. She is at home and I am in my home office.   Panzy: [name] says that her family in [place] is okay, but scared. She talked about her experience living in [place] during  war time and all of the trauma she experienced. She says that it is often on her mind these days due to the war. Clois says that she is feeling very pleased that her son has been more social lately. She is invested in her son finding a life partner and wishes he would spend more energy in that effort. She also expressed concerns about son's anxieties and feels it inhibits his ability to be attractive to others. Will talk with him about starting to address.                                                                                                                                CONI ALM KERNS, PhD 10:40a-11:30a 50 min

## 2023-12-02 ENCOUNTER — Ambulatory Visit

## 2023-12-02 DIAGNOSIS — Z7901 Long term (current) use of anticoagulants: Secondary | ICD-10-CM | POA: Diagnosis not present

## 2023-12-02 LAB — POCT INR: INR: 1.7 — AB (ref 2.0–3.0)

## 2023-12-02 MED ORDER — WARFARIN SODIUM 2.5 MG PO TABS: ORAL_TABLET | ORAL | 1 refills | Status: DC

## 2023-12-02 NOTE — Patient Instructions (Addendum)
 Pre visit review using our clinic review tool, if applicable. No additional management support is needed unless otherwise documented below in the visit note.  Continue 5 mg (2 tablets) daily except take 2.5 mg (1 tablet) on Sundays and Thursdays and take 3.75 mg (1 1/2 tablets) on Tuesdays. Recheck in 4 weeks.

## 2023-12-02 NOTE — Progress Notes (Signed)
 Continue 5 mg (2 tablets) daily except take 2.5 mg (1 tablet) on Sundays and Thursdays and take 3.75 mg (1 1/2 tablets) on Tuesdays. Recheck in 4 weeks.   Pt is compliant with warfarin management and PCP apts.  Sent in refill of warfarin to requested pharmacy.

## 2023-12-04 ENCOUNTER — Encounter: Payer: Self-pay | Admitting: Advanced Practice Midwife

## 2023-12-14 ENCOUNTER — Ambulatory Visit (INDEPENDENT_AMBULATORY_CARE_PROVIDER_SITE_OTHER): Admitting: Psychology

## 2023-12-14 DIAGNOSIS — F411 Generalized anxiety disorder: Secondary | ICD-10-CM

## 2023-12-14 NOTE — Progress Notes (Signed)
 12/14/2023  Treatrment Plan:  Diagnosis F41.1 (Generalized anxiety disorder) [n/a]  Z62.820 (Parent child relationship problem) [n/a]  Symptoms Excessive and/or unrealistic worry that is difficult to control occurring more days than not for at least 6 months about a number of events or activities. (Status: maintained) -- No Description Entered  Regularly overindulges their child's wishes and demands. (Status: maintained) -- No Description Entered  Medication Status compliance  Safety none  If Suicidal or Homicidal State Action Taken: unspecified  Current Risk: low Medications unspecified Objectives Related Problem: Achieve a level of competent, effective parenting. Description: Increase the gradual letting go of their adolescent in constructive, affirmative ways. Target Date: 2024-05-14 Frequency: Daily Modality: individual Progress: 85%  Related Problem: Achieve a level of competent, effective parenting. Description: Identify unresolved childhood issues that affect parenting and work toward their resolution. Target Date: 2024-05-14 Frequency: Daily Modality: individual Progress: 90%  Related Problem: Achieve a level of competent, effective parenting. Description: Freely express feelings of frustration, helplessness, and inadequacy that each experiences in the parenting role. Target Date: 2024-05-14 Frequency: Daily Modality: individual Progress: 85%  Related Problem: Resolve the core conflict that is the source of anxiety. Description: Learn and implement problem-solving strategies for realistically addressing worries. Target Date: 2024-05-14 Frequency: Daily Modality: individual Progress: 85%  Related Problem: Resolve the core conflict that is the source of anxiety. Description: Learn and implement calming skills to reduce overall anxiety and manage anxiety symptoms. Target Date:  2024-05-14 Frequency: Daily Modality: individual Progress: 75%  Related Problem: Resolve the core conflict that is the source of anxiety. Description: Describe situations, thoughts, feelings, and actions associated with anxieties and worries, their impact on functioning, and attempts to resolve them. Target Date: 2024-05-14 Frequency: Daily Modality: individual Progress: 75%  Client Response full compliance  Service Location Location, [address] [place], [place] [address]  Service Code cpt [phone]  Behavioral activation plan  Facilitate problem solving  Identify automatic thoughts  Emotion regulation skills  Provide education, information  Self care activities  Relaxation training  Lifestyle change (exercise, nutrition)  Self-monitoring  Validate/empathize  Session notes: F 41.1  Goals: She has chronic anxieties, mostly related to health concerns and her son's well-being. Would like to develop strategies to manage symptoms of anxiety. Needs to understand enmeshment and learn to define and manage appropriate family boundaries. Target date is 12-23. Patient has realized significant improvement in recognizing and establishing appropriate boundaries. Anxiety about health and family matters persist and she desires to continue treatment in an effort to manage and reduce these feelings. Revised target date is 12-25.  Meds: Xanax  (.25 mg);   Patient agrees to a video session and is aware of the limitations of this platform. She is at home and I am in my home office.   Gwenette: [name] says she is doing okay, but her anxiety level is high. She feels it is the current events that keeps her anxiety high. States she always thinks of the worst', but isn't sure why. Says that she has never been an optimistic person. She thinks it may be related to to a conflictual household (between parents) and then living through the war. Her father was very controlling. Her mother challenged father  and went to college and was a Runner, broadcasting/film/video. Her father had a high position before  the revolution. She grew up with drivers and cooks and it all changed overnight. Her father was arrested during revolution. Many were executed, but he was spared. She feels that there was a base of anxiety that was established early and she has not been able to overcome.  [name] had a second date with a woman, but says he was not attracted to her and decided not to see her again. He talked about what he might do to improve social life. Dorothy is anxious but hopeful about [name]'s social life.                                                                                                                                   CONI ALM KERNS, PhD 10:40a-11:30a 50 min

## 2023-12-28 ENCOUNTER — Ambulatory Visit (INDEPENDENT_AMBULATORY_CARE_PROVIDER_SITE_OTHER): Admitting: Psychology

## 2023-12-28 DIAGNOSIS — F411 Generalized anxiety disorder: Secondary | ICD-10-CM

## 2023-12-28 NOTE — Progress Notes (Signed)
 12/28/2023  Treatrment Plan:  Diagnosis F41.1 (Generalized anxiety disorder) [n/a]  Z62.820 (Parent child relationship problem) [n/a]  Symptoms Excessive and/or unrealistic worry that is difficult to control occurring more days than not for at least 6 months about a number of events or activities. (Status: maintained) -- No Description Entered  Regularly overindulges their child's wishes and demands. (Status: maintained) -- No Description Entered  Medication Status compliance  Safety none  If Suicidal or Homicidal State Action Taken: unspecified  Current Risk: low Medications unspecified Objectives Related Problem: Achieve a level of competent, effective parenting. Description: Increase the gradual letting go of their adolescent in constructive, affirmative ways. Target Date: 2024-05-14 Frequency: Daily Modality: individual Progress: 85%  Related Problem: Achieve a level of competent, effective parenting. Description: Identify unresolved childhood issues that affect parenting and work toward their resolution. Target Date: 2024-05-14 Frequency: Daily Modality: individual Progress: 90%  Related Problem: Achieve a level of competent, effective parenting. Description: Freely express feelings of frustration, helplessness, and inadequacy that each experiences in the parenting role. Target Date: 2024-05-14 Frequency: Daily Modality: individual Progress: 85%  Related Problem: Resolve the core conflict that is the source of anxiety. Description: Learn and implement problem-solving strategies for realistically addressing worries. Target Date: 2024-05-14 Frequency: Daily Modality: individual Progress: 85%  Related Problem: Resolve the core conflict that is the source of anxiety. Description: Learn and implement calming skills to reduce overall anxiety and manage anxiety  symptoms. Target Date: 2024-05-14 Frequency: Daily Modality: individual Progress: 75%  Related Problem: Resolve the core conflict that is the source of anxiety. Description: Describe situations, thoughts, feelings, and actions associated with anxieties and worries, their impact on functioning, and attempts to resolve them. Target Date: 2024-05-14 Frequency: Daily Modality: individual Progress: 75%  Client Response full compliance  Service Location Location, [address] [place], [place] [address]  Service Code cpt [phone]  Behavioral activation plan  Facilitate problem solving  Identify automatic thoughts  Emotion regulation skills  Provide education, information  Self care activities  Relaxation training  Lifestyle change (exercise, nutrition)  Self-monitoring  Validate/empathize  Session notes: F 41.1  Goals: She has chronic anxieties, mostly related to health concerns and her son's well-being. Would like to develop strategies to manage symptoms of anxiety. Needs to understand enmeshment and learn to define and manage appropriate family boundaries. Target date is 12-23. Patient has realized significant improvement in recognizing and establishing appropriate boundaries. Anxiety about health and family matters persist and she desires to continue treatment in an effort to manage and reduce these feelings. Revised target date is 12-25.  Meds: Xanax  (.25 mg);   Patient agrees to a video session and is aware of the limitations of this platform. She is at home and I am in my home office.   Jessye: Banafshef says that her anxiety is up and down. Feels better when she avoids the news. She is saying she is still very concerned that her son Sorousch still spends too much of his time at work and is not enjoying his life. He staes tha he has a time management problem and is unsure how to allocate time for tasks. He also says that he works at a slow, non-urgent pace. Ruie  says that  she has confidence that her son has capacity and fears he will never make the changes she feels are necessary for him to be happy. Talked about how to engage with specialist that can facilitate his work process. For her anxiety, suggested behavioral strategies to manage her worry, and to engage in intellectual and social activities as a distraction.                                                                                                                                      CONI ALM KERNS, PhD 10:40a-11:30a 50 min

## 2023-12-30 ENCOUNTER — Ambulatory Visit

## 2023-12-30 DIAGNOSIS — Z7901 Long term (current) use of anticoagulants: Secondary | ICD-10-CM | POA: Diagnosis not present

## 2023-12-30 LAB — POCT INR: INR: 1.7 — AB (ref 2.0–3.0)

## 2023-12-30 NOTE — Progress Notes (Signed)
 Continue 5 mg (2 tablets) daily except take 2.5 mg (1 tablet) on Sundays and Thursdays and take 3.75 mg (1 1/2 tablets) on Tuesdays. Recheck in 4 weeks.

## 2023-12-30 NOTE — Patient Instructions (Addendum)
 Pre visit review using our clinic review tool, if applicable. No additional management support is needed unless otherwise documented below in the visit note.  Continue 5 mg (2 tablets) daily except take 2.5 mg (1 tablet) on Sundays and Thursdays and take 3.75 mg (1 1/2 tablets) on Tuesdays. Recheck in 4 weeks.

## 2024-01-11 ENCOUNTER — Ambulatory Visit (INDEPENDENT_AMBULATORY_CARE_PROVIDER_SITE_OTHER): Admitting: Psychology

## 2024-01-11 DIAGNOSIS — F411 Generalized anxiety disorder: Secondary | ICD-10-CM
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
## 2024-01-14 ENCOUNTER — Other Ambulatory Visit: Payer: Self-pay | Admitting: Neurology

## 2024-01-15 NOTE — Telephone Encounter (Signed)
 Last seen on 07/14/23 Follow up scheduled on 07/13/24   Dispensed Days Supply Quantity Provider Pharmacy  PREGABALIN  50MG  CAPSULES 10/09/2023 90 180 each Sater, Charlie LABOR, MD [facility]...     Rx pending to be signed

## 2024-01-22 ENCOUNTER — Other Ambulatory Visit: Payer: Self-pay | Admitting: *Deleted

## 2024-01-22 DIAGNOSIS — Z79899 Other long term (current) drug therapy: Secondary | ICD-10-CM

## 2024-01-22 LAB — CBC WITH DIFFERENTIAL/PLATELET
Absolute Lymphocytes: 2850 {cells}/uL (ref 850–3900)
Absolute Monocytes: 600 {cells}/uL (ref 200–950)
Basophils Absolute: 60 {cells}/uL (ref 0–200)
Basophils Relative: 0.8 %
Eosinophils Absolute: 120 {cells}/uL (ref 15–500)
Eosinophils Relative: 1.6 %
HCT: 43.5 % (ref 35.0–45.0)
Hemoglobin: 14.2 g/dL (ref 11.7–15.5)
MCH: 28 pg (ref 27.0–33.0)
MCHC: 32.6 g/dL (ref 32.0–36.0)
MCV: 85.8 fL (ref 80.0–100.0)
MPV: 12.3 fL (ref 7.5–12.5)
Monocytes Relative: 8 %
Neutro Abs: 3870 {cells}/uL (ref 1500–7800)
Neutrophils Relative %: 51.6 %
Platelets: 240 Thousand/uL (ref 140–400)
RBC: 5.07 Million/uL (ref 3.80–5.10)
RDW: 13 % (ref 11.0–15.0)
Total Lymphocyte: 38 %
WBC: 7.5 Thousand/uL (ref 3.8–10.8)

## 2024-01-22 LAB — COMPREHENSIVE METABOLIC PANEL WITH GFR
AG Ratio: 1.5 (calc) (ref 1.0–2.5)
ALT: 12 U/L (ref 6–29)
AST: 19 U/L (ref 10–35)
Albumin: 4.1 g/dL (ref 3.6–5.1)
Alkaline phosphatase (APISO): 85 U/L (ref 37–153)
BUN: 11 mg/dL (ref 7–25)
CO2: 28 mmol/L (ref 20–32)
Calcium: 9.3 mg/dL (ref 8.6–10.4)
Chloride: 106 mmol/L (ref 98–110)
Creat: 0.8 mg/dL (ref 0.50–1.05)
Globulin: 2.7 g/dL (ref 1.9–3.7)
Glucose, Bld: 93 mg/dL (ref 65–99)
Potassium: 4.4 mmol/L (ref 3.5–5.3)
Sodium: 141 mmol/L (ref 135–146)
Total Bilirubin: 0.6 mg/dL (ref 0.2–1.2)
Total Protein: 6.8 g/dL (ref 6.1–8.1)
eGFR: 82 mL/min/1.73m2 (ref >=60)

## 2024-01-24 ENCOUNTER — Ambulatory Visit: Payer: Self-pay | Admitting: Rheumatology

## 2024-01-24 NOTE — Progress Notes (Signed)
 CBC and CMP are normal.

## 2024-01-25 ENCOUNTER — Ambulatory Visit (INDEPENDENT_AMBULATORY_CARE_PROVIDER_SITE_OTHER): Admitting: Psychology

## 2024-01-25 DIAGNOSIS — Z62820 Parent-biological child conflict: Secondary | ICD-10-CM | POA: Diagnosis not present

## 2024-01-25 DIAGNOSIS — F411 Generalized anxiety disorder: Secondary | ICD-10-CM

## 2024-01-25 NOTE — Progress Notes (Signed)
 01/25/2024  Treatrment Plan:  Diagnosis F41.1 (Generalized anxiety disorder) [n/a]  Z62.820 (Parent child relationship problem) [n/a]  Symptoms Excessive and/or unrealistic worry that is difficult to control occurring more days than not for at least 6 months about a number of events or activities. (Status: maintained) -- No Description Entered  Regularly overindulges their child's wishes and demands. (Status: maintained) -- No Description Entered  Medication Status compliance  Safety none  If Suicidal or Homicidal State Action Taken: unspecified  Current Risk: low Medications unspecified Objectives Related Problem: Achieve a level of competent, effective parenting. Description: Increase the gradual letting go of their adolescent in constructive, affirmative ways. Target Date: 2024-05-14 Frequency: Daily Modality: individual Progress: 85%  Related Problem: Achieve a level of competent, effective parenting. Description: Identify unresolved childhood issues that affect parenting and work toward their resolution. Target Date: 2024-05-14 Frequency: Daily Modality: individual Progress: 90%  Related Problem: Achieve a level of competent, effective parenting. Description: Freely express feelings of frustration, helplessness, and inadequacy that each experiences in the parenting role. Target Date: 2024-05-14 Frequency: Daily Modality: individual Progress: 85%  Related Problem: Resolve the core conflict that is the source of anxiety. Description: Learn and implement problem-solving strategies for realistically addressing worries. Target Date: 2024-05-14 Frequency: Daily Modality: individual Progress: 85%  Related Problem: Resolve the core conflict that is the source of anxiety. Description: Learn and implement calming skills  to reduce overall anxiety and manage anxiety symptoms. Target Date: 2024-05-14 Frequency: Daily Modality: individual Progress: 75%  Related Problem: Resolve the core conflict that is the source of anxiety. Description: Describe situations, thoughts, feelings, and actions associated with anxieties and worries, their impact on functioning, and attempts to resolve them. Target Date: 2024-05-14 Frequency: Daily Modality: individual Progress: 75%  Client Response full compliance  Service Location Location, [address] [place], [place] [address]  Service Code cpt [phone]  Behavioral activation plan  Facilitate problem solving  Identify automatic thoughts  Emotion regulation skills  Provide education, information  Self care activities  Relaxation training  Lifestyle change (exercise, nutrition)  Self-monitoring  Validate/empathize  Session notes: F 41.1  Goals: She has chronic anxieties, mostly related to health concerns and her son's well-being. Would like to develop strategies to manage symptoms of anxiety. Needs to understand enmeshment and learn to define and manage appropriate family boundaries. Target date is 12-23. Patient has realized significant improvement in recognizing and establishing appropriate boundaries. Anxiety about health and family matters persist and she desires to continue treatment in an effort to manage and reduce these feelings. Revised target date is 12-25.  Meds: Xanax  (.25 mg);   Patient agrees to a video session and is aware of the limitations of this platform. She is at home and I am in my home office.   Naava: [name] is relieved that her family in [place] is still okay even though the situation there is bad. She is concerned about  her son's time management. Says he is not getting things done and is not taking care of himself. Her concerns  are justified because he shares with her that he does not manage to do anything outside of work. He is also  sleeping way too much. Reign is doing better with her self care, but struggles to contain her anxiety about Souroush.                                                                                                                                          CONI ALM KERNS, PhD 10:40a-11:30a 50 min

## 2024-01-27 ENCOUNTER — Ambulatory Visit (INDEPENDENT_AMBULATORY_CARE_PROVIDER_SITE_OTHER)

## 2024-01-27 DIAGNOSIS — Z7901 Long term (current) use of anticoagulants: Secondary | ICD-10-CM

## 2024-01-27 LAB — POCT INR: INR: 1.6 — AB (ref 2.0–3.0)

## 2024-01-27 NOTE — Patient Instructions (Addendum)
Pre visit review using our clinic review tool, if applicable. No additional management support is needed unless otherwise documented below in the visit note.  Continue 5 mg (2 tablets) daily except take 2.5 mg (1 tablet) on Sundays and Thursdays and take 3.75 mg (1 1/2 tablets) on Tuesdays. Recheck in 5 weeks.

## 2024-01-27 NOTE — Progress Notes (Signed)
Continue 5 mg (2 tablets) daily except take 2.5 mg (1 tablet) on Sundays and Thursdays and take 3.75 mg (1 1/2 tablets) on Tuesdays. Recheck in 5 weeks.

## 2024-02-08 ENCOUNTER — Ambulatory Visit (INDEPENDENT_AMBULATORY_CARE_PROVIDER_SITE_OTHER): Admitting: Psychology

## 2024-02-08 DIAGNOSIS — Z62820 Parent-biological child conflict: Secondary | ICD-10-CM | POA: Diagnosis not present

## 2024-02-08 DIAGNOSIS — F411 Generalized anxiety disorder: Secondary | ICD-10-CM | POA: Diagnosis not present
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
## 2024-02-09 ENCOUNTER — Ambulatory Visit: Admitting: Internal Medicine

## 2024-02-17 ENCOUNTER — Ambulatory Visit: Admitting: Internal Medicine

## 2024-02-17 ENCOUNTER — Other Ambulatory Visit: Payer: Self-pay | Admitting: Physician Assistant

## 2024-02-17 VITALS — BP 102/70 | HR 60 | Temp 97.6°F | Ht 62.5 in | Wt 166.4 lb

## 2024-02-17 DIAGNOSIS — Z789 Other specified health status: Secondary | ICD-10-CM | POA: Diagnosis not present

## 2024-02-17 DIAGNOSIS — N6459 Other signs and symptoms in breast: Secondary | ICD-10-CM

## 2024-02-17 DIAGNOSIS — M0609 Rheumatoid arthritis without rheumatoid factor, multiple sites: Secondary | ICD-10-CM

## 2024-02-17 DIAGNOSIS — Z79899 Other long term (current) drug therapy: Secondary | ICD-10-CM

## 2024-02-17 DIAGNOSIS — M7989 Other specified soft tissue disorders: Secondary | ICD-10-CM

## 2024-02-17 DIAGNOSIS — Z9189 Other specified personal risk factors, not elsewhere classified: Secondary | ICD-10-CM

## 2024-02-17 DIAGNOSIS — M810 Age-related osteoporosis without current pathological fracture: Secondary | ICD-10-CM | POA: Diagnosis not present

## 2024-02-17 DIAGNOSIS — Z7901 Long term (current) use of anticoagulants: Secondary | ICD-10-CM

## 2024-02-17 DIAGNOSIS — E2839 Other primary ovarian failure: Secondary | ICD-10-CM

## 2024-02-17 NOTE — Patient Instructions (Addendum)
 Lets just follow .  The breast  Issues seems benign but follow  and if  changing  we can recheck .   Will do a cardiology  eval for cv  health because of the .  Issues we discussed  exam is good and  swelling  may be  venous  insufficiency  .

## 2024-02-17 NOTE — Telephone Encounter (Signed)
 Last Fill: 11/27/2023  Eye exam: 02/26/2023 WNL   Labs: 01/22/2024 CBC and CMP are normal.   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 twice daily Monday to Friday   Okay to refill Plaquenil ?

## 2024-02-17 NOTE — Progress Notes (Signed)
 Chief Complaint  Patient presents with   Medical Management of Chronic Issues    Pt reports her breast has more redness. Did mammogram- did not find anything. Would like to discuss on osteoporosis medication.     HPI: [name] 63 y.o. come in for Chronic disease management   On drug holiday  for  fosamax .   Dr D has advised other options for now  Forteo not covered .     to reclast.   tymlos . Ask input.thoughts.  Gets leg swelling  still battling problem with left ankle and foot. Ask if she could have a heart problem or other cause. No specific cp sob . Palpitations  but now sure   Breast bilateral discoloration off and on still persists and derm and gyne felt not breast tissue related  mammo  dx nl.   ROS: See pertinent positives and negatives per HPI.  Past Medical History:  Diagnosis Date   Adjustment disorder with anxiety    B12 deficiency due to diet    is a vegetarian   Chest pain, unspecified    Chronic anticoagulation    Fatty liver    Per patient   GERD (gastroesophageal reflux disease)    Headache(784.0)    migraines and head pain   Hx of varicella    As Child   Iron deficiency anemia, unspecified    Otalgia, unspecified    Other and unspecified hyperlipidemia    Palpitations    Personal history of venous thrombosis and embolism    Recurrent UTI    Rheumatoid arthritis(714.0)    deveshewar   Unspecified vitamin D  deficiency    UTI (lower urinary tract infection) 11/28/2011   citrobacter       Family History  Problem Relation Age of Onset   Parkinsonism Mother    Dementia Father    Stroke Father    Hyperlipidemia Father    Diabetes Father    Lymphoma Other    Colon cancer Neg Hx    Colon polyps Neg Hx    Esophageal cancer Neg Hx    Stomach cancer Neg Hx    Rectal cancer Neg Hx    Breast cancer Neg Hx     Social History   Socioeconomic History   Marital status: Married    Spouse name: Not on file   Number of children: 1   Years  of education: Not on file   Highest education level: Bachelor's degree (e.g., BA, AB, BS)  Occupational History   Occupation: home maker  Tobacco Use   Smoking status: Never    Passive exposure: Past   Smokeless tobacco: Never  Vaping Use   Vaping status: Never Used  Substance and Sexual Activity   Alcohol use: No   Drug use: Never   Sexual activity: Not on file  Other Topics Concern   Not on file  Social History Narrative   Left handed   Caffeine use: Tea daily, coffee once a week   Regular [place]Married HH  of 3 G1P1   Social Drivers of Corporate investment banker Strain: Low Risk  (02/17/2024)   Overall Financial Resource Strain (CARDIA)    Difficulty of Paying Living Expenses: Not hard at all  Food Insecurity: No Food Insecurity (02/17/2024)   Hunger Vital Sign    Worried About Running Out of Food in the Last Year: Never true    Ran Out of Food in the Last Year:  Never true  Transportation Needs: No Transportation Needs (02/17/2024)   PRAPARE - Administrator, Civil Service (Medical): No    Lack of Transportation (Non-Medical): No  Physical Activity: Insufficiently Active (02/17/2024)   Exercise Vital Sign    Days of Exercise per Week: 3 days    Minutes of Exercise per Session: 40 min  Stress: Stress Concern Present (02/17/2024)   [employer] of Occupational Health - Occupational Stress Questionnaire    Feeling of Stress: To some extent  Social Connections: Moderately Isolated (02/17/2024)   Social Connection and Isolation Panel    Frequency of Communication with Friends and Family: Three times a week    Frequency of Social Gatherings with Friends and Family: Once a week    Attends Religious Services: Patient declined    Database administrator or Organizations: No    Attends Engineer, structural: Not on file    Marital Status: Married    Outpatient Medications Prior to Visit  Medication Sig Dispense  Refill   AZO D-MANNOSE PO Take by mouth.     Calcium Carb-Cholecalciferol 600-800 MG-UNIT TABS Take by mouth.     Cholecalciferol (VITAMIN D3) 2000 UNITS TABS Take 1 tablet by mouth daily.     Cyanocobalamin  (VITAMIN B-12 PO) Take by mouth once a week.     cycloSPORINE (RESTASIS) 0.05 % ophthalmic emulsion Place 1 drop into both eyes daily.     diclofenac Sodium (VOLTAREN) 1 % GEL Apply topically 4 (four) times daily.     Eflornithine HCl 13.9 % cream Apply topically daily.      folic acid  (FOLVITE ) 1 MG tablet TAKE 2 TABLETS(2 MG) BY MOUTH DAILY 180 tablet 3   methotrexate  (RHEUMATREX) 2.5 MG tablet TAKE 8 TABLETS BY MOUTH ONCE WEEKLY 96 tablet 0   mineral/vitamin supplement (MULTIGEN) 70 MG TABS tablet TAKE 1 TABLET (70 MG TOTAL) BY MOUTH DAILY. 90 tablet 1   Omeprazole  Magnesium (PRILOSEC PO) Take by mouth 3 (three) times a week.     Polyethylene Glycol 3350 (MIRALAX PO) Take by mouth as needed.     pregabalin  (LYRICA ) 50 MG capsule TAKE 1 CAPSULE(50 MG) BY MOUTH DAILY EVERY MORNING AND EVERY NIGHT AT BEDTIME 180 capsule 1   SUMAtriptan  (IMITREX ) 50 MG tablet TAKE ONE TABLET BY MOUTH AS NEEDED. MAY REPEAT IN 2 TO 4 HOURS IF NEEDED. 9 tablet 1   warfarin (COUMADIN ) 2.5 MG tablet TAKE 2 TABLETS BY MOUTH DAILY, EXCEPT TAKE  1 TABLET ON SUNDAYS AND THURSDAYS AND TAKE 1 1/2 TABLETS ON TUESDAYS OR AS DIRECTED BY ANTICOAGULATION CLINIC 180 tablet 1   hydroxychloroquine  (PLAQUENIL ) 200 MG tablet TAKE 1 TABLET BY MOUTH TWICE DAILY( MONDAY THROUGH FRIDAY ONLY) 120 tablet 0   ALPRAZolam  (XANAX ) 0.25 MG tablet Take 1 tablet (0.25 mg total) by mouth 2 (two) times daily as needed for anxiety. (Patient not taking: Reported on 02/17/2024) 20 tablet 0   Famotidine (PEPCID PO) Take by mouth 4 (four) times a week. (Patient not taking: Reported on 02/17/2024)     mupirocin  ointment (BACTROBAN ) 2 % Apply topically 2 (two) times daily. (Patient not taking: Reported on 02/17/2024) 15 g 1   No facility-administered  medications prior to visit.     EXAM:  BP 102/70 (BP Location: Right Arm, Patient Position: Sitting, Cuff Size: Normal)   Pulse 60   Temp 97.6 F (36.4 C) (Oral)   Ht 5' 2.5 (1.588 m)   Wt 166 lb 6.4 oz (75.5 kg)  SpO2 97%   BMI 29.95 kg/m   Body mass index is 29.95 kg/m.  GENERAL: vitals reviewed and listed above, alert, oriented, appears well hydrated and in no acute distress HEENT: atraumatic, conjunctiva  clear, no obvious abnormalities on inspection of external nose and ears NECK: no obvious masses on inspection palpation  Breast faint  superficial pink mottling( in bra area)   no mass  symmetrica  LUNGS: clear to auscultation bilaterally, no wheezes, rales or rhonchi, good air movement CV: HRRR, no clubbing cyanosis  no Murmur heard 1+ to trx edema   nl cap refill  MS: moves all extremities without noticeable focal  abnormality PSYCH: pleasant and cooperative, no obvious depression or anxiety Lab Results  Component Value Date   WBC 7.5 01/22/2024   HGB 14.2 01/22/2024   HCT 43.5 01/22/2024   PLT 240 01/22/2024   GLUCOSE 93 01/22/2024   CHOL 214 (H) 10/07/2023   TRIG 139.0 10/07/2023   HDL 75.50 10/07/2023   LDLDIRECT 97.3 05/25/2013   LDLCALC 111 (H) 10/07/2023   ALT 12 01/22/2024   AST 19 01/22/2024   NA 141 01/22/2024   K 4.4 01/22/2024   CL 106 01/22/2024   CREATININE 0.80 01/22/2024   BUN 11 01/22/2024   CO2 28 01/22/2024   TSH 3.30 10/07/2023   INR 1.6 (A) 01/27/2024   HGBA1C 6.0 10/07/2023   MICROALBUR <0.7 10/07/2023   BP Readings from Last 3 Encounters:  02/17/24 102/70  11/19/23 97/64  10/28/23 104/80    ASSESSMENT AND PLAN:  Discussed the following assessment and plan:  Symptoms in breast  Age related osteoporosis, unspecified pathological fracture presence  Leg swelling - Plan: Ambulatory referral to Cardiology  Medically complex patient - Plan: Ambulatory referral to Cardiology  Rheumatoid arthritis of multiple sites with  negative rheumatoid factor (HCC) - under rheum care  Medication management  Long term (current) use of anticoagulants [Z79.01]  At increased risk for cardiovascular disease - Plan: Ambulatory referral to Cardiology Reassuring eva for breast color change   consider  external factors  and disc to  follow   and reev if progressive .  Leg swelling seems mild and could be venous insuff  but has some  concerns  about cv risk .  Has some high risk conditions and thus concerns .  Gfr was been ok . After disc  would be best to do cards consult as to cv health and r/o card cause of sx .   Disc osteoporosis meds  and review     consider tymlos   -Patient advised to return or notify health care team  if  new concerns arise.  Patient Instructions  Lets just follow .  The breast  Issues seems benign but follow  and if  changing  we can recheck .   Will do a cardiology  eval for cv  health because of the .  Issues we discussed  exam is good and  swelling  may be  venous  insufficiency  .   Reigan Tolliver K. Amiria Orrison M.D.

## 2024-02-20 ENCOUNTER — Other Ambulatory Visit: Payer: Self-pay | Admitting: Rheumatology

## 2024-02-21 ENCOUNTER — Encounter: Payer: Self-pay | Admitting: Internal Medicine

## 2024-02-26 NOTE — Progress Notes (Signed)
 Office Visit Note  Patient: [name]             Date of Birth: 03-Nov-1960           MRN: [id number]             PCP: Charlett Apolinar POUR, MD Referring: Charlett Apolinar POUR, MD Visit Date: 03/10/2024 Occupation: Data Unavailable  Subjective:  Medication management  History of Present Illness: Kamira Mellette is a 63 y.o. female with negative rheumatoid arthritis, osteoarthritis, degenerative disc disease and osteoporosis.  She returns today after her last visit on November 19, 2023.  She states she wants to mainly discuss about the management of osteoporosis.  She has been doing a lot of reading on different treatment options.  She had several questions regarding Forteo, Tymlos and IV Reclast.  As she is currently on a drug holiday.  She has been taking calcium and vitamin D  and trying to exercise.  She has not had a flare of rheumatoid arthritis.  She has been taking methotrexate  8 tablets p.o. weekly along with folic acid  2 mg daily and hydroxychloroquine  200 mg p.o. twice daily Monday to Friday without any interruption.  She states she recently had a flu vaccine and will be holding methotrexate  for 1 or 2 weeks depending on the joint discomfort.    Activities of Daily Living:  Patient reports morning stiffness for 0 minutes.   Patient Denies nocturnal pain.  Difficulty dressing/grooming: Denies Difficulty climbing stairs: Denies Difficulty getting out of chair: Denies Difficulty using hands for taps, buttons, cutlery, and/or writing: Denies  Review of Systems  Constitutional:  Positive for fatigue.  HENT:  Positive for mouth dryness. Negative for mouth sores.   Eyes:  Positive for dryness.  Respiratory:  Negative for shortness of breath.   Cardiovascular:  Positive for palpitations. Negative for chest pain.  Gastrointestinal:  Positive for constipation. Negative for blood in stool and diarrhea.  Endocrine: Negative for increased urination.  Genitourinary:  Positive for nocturia.  Negative for involuntary urination.  Musculoskeletal:  Positive for joint pain and joint pain. Negative for gait problem, joint swelling, myalgias, muscle weakness, morning stiffness, muscle tenderness and myalgias.  Skin:  Positive for rash, hair loss, redness and sensitivity to sunlight. Negative for color change.  Allergic/Immunologic: Positive for susceptible to infections.  Neurological:  Negative for dizziness and headaches.  Hematological:  Negative for swollen glands.  Psychiatric/Behavioral:  Negative for depressed mood and sleep disturbance. The patient is nervous/anxious.     PMFS History:  Patient Active Problem List   Diagnosis Date Noted   Lumbosacral radiculopathy at L5 10/08/2022   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HCC) 01/06/2018   Long term (current) use of anticoagulants 02/11/2017   Sicca syndrome 12/22/2016   ANA positive 12/22/2016   Raised intraocular pressure of both eyes 12/22/2016   Migraine 03/31/2016   Anxiety 03/31/2016   High risk medication use 03/31/2016   Primary osteoarthritis of both feet 03/31/2016   Primary osteoarthritis of both knees 03/31/2016   Stiffness of left hand joint 11/27/2014   Elevated IOP 06/09/2014   Long term current use of systemic steroids 06/09/2014   Medically complex patient 06/09/2014   Current use of steroid medication 01/09/2014   Xerosis of skin 12/07/2013   Encounter for therapeutic drug monitoring 06/09/2013   B12 deficiency 05/31/2013   Iron deficiency 05/31/2013   Visit for preventive health examination 05/25/2012   Mammogram abnormal 04/12/2012   Iliac crest  pain 01/23/2012  Vaginal atrophy 01/23/2012   History of recurrent UTI (urinary tract infection) 01/23/2012   Osteoporosis 04/29/2011   Nonspecific abnormal results of liver function study 12/08/2010   Chronic anticoagulation    Anticoagulant long-term use 07/15/2010   Anticardiolipin antibody positive 07/15/2010   Chronic cough 03/19/2009   CHEST  PAIN-UNSPECIFIED 08/26/2008   PALPITATIONS, RECURRENT 07/24/2008   Vitamin D  deficiency 05/30/2008   HYPERLIPIDEMIA 05/30/2008   IRON DEFICIENCY 05/30/2008   Adjustment disorder with anxiety 05/30/2008   Rheumatoid arthritis (HCC) 05/30/2008   PULMONARY EMBOLISM, HX OF 05/30/2008    Past Medical History:  Diagnosis Date   Adjustment disorder with anxiety    B12 deficiency due to diet    is a vegetarian   Chest pain, unspecified    Chronic anticoagulation    Fatty liver    Per patient   GERD (gastroesophageal reflux disease)    Headache(784.0)    migraines and head pain   Hx of varicella    As Child   Iron deficiency anemia, unspecified    Otalgia, unspecified    Other and unspecified hyperlipidemia    Palpitations    Personal history of venous thrombosis and embolism    Recurrent UTI    Rheumatoid arthritis(714.0)    deveshewar   Unspecified vitamin D  deficiency    UTI (lower urinary tract infection) 11/28/2011   citrobacter       Family History  Problem Relation Age of Onset   Parkinsonism Mother    Dementia Father    Stroke Father    Hyperlipidemia Father    Diabetes Father    Lymphoma Other    Colon cancer Neg Hx    Colon polyps Neg Hx    Esophageal cancer Neg Hx    Stomach cancer Neg Hx    Rectal cancer Neg Hx    Breast cancer Neg Hx    Past Surgical History:  Procedure Laterality Date   ORIF ULNAR FRACTURE     TYMPANOSTOMY TUBE PLACEMENT  2009   Right   Social History   Tobacco Use   Smoking status: Never    Passive exposure: Past   Smokeless tobacco: Never  Vaping Use   Vaping status: Never Used  Substance Use Topics   Alcohol use: No   Drug use: Never   Social History   Social History Narrative   Left handed   Caffeine use: Tea daily, coffee once a week   Regular Exercise-[place]20 yrs [place] [employer]Married HH  of 3 G1P1     Immunization History  Administered Date(s) Administered   Influenza, Seasonal, Injecte,  Preservative Fre 03/03/2023, 03/09/2024   Influenza,inj,Quad PF,6+ Mos 04/19/2013, 06/27/2015, 03/05/2016, 02/11/2017, 03/15/2018, 02/23/2019, 04/02/2020, 03/13/2021, 02/26/2022   PFIZER(Purple Top)SARS-COV-2 Vaccination 08/06/2019, 08/25/2019, 01/12/2020, 11/10/2020   PPD Test 07/21/2016   Tdap 05/31/2013   Zoster Recombinant(Shingrix) 01/23/2021, 06/11/2021     Objective: Vital Signs: BP 106/77   Pulse (!) 53   Temp (!) 97.2 F (36.2 C)   Resp 16   Ht 5' 2.5 (1.588 m)   Wt 163 lb 12.8 oz (74.3 kg)   BMI 29.48 kg/m    Physical Exam Vitals and nursing note reviewed.  Constitutional:      Appearance: She is well-developed.  HENT:     Head: Normocephalic and atraumatic.  Eyes:     Conjunctiva/sclera: Conjunctivae normal.  Cardiovascular:     Rate and Rhythm: Normal rate and regular rhythm.     Heart sounds: Normal heart sounds.  Pulmonary:     Effort: Pulmonary effort is normal.     Breath sounds: Normal breath sounds.  Abdominal:     General: Bowel sounds are normal.     Palpations: Abdomen is soft.  Musculoskeletal:     Cervical back: Normal range of motion.  Lymphadenopathy:     Cervical: No cervical adenopathy.  Skin:    General: Skin is warm and dry.     Capillary Refill: Capillary refill takes less than 2 seconds.  Neurological:     Mental Status: She is alert and oriented to person, place, and time.  Psychiatric:        Behavior: Behavior normal.      Musculoskeletal Exam: Cervical, thoracic and lumbar spine were in good range of motion.  There was no SI joint tenderness.  Shoulder joints, elbow joints, wrist joints, MCPs, PIPs and DIPs were in good range of motion with no synovitis.  Hip joints and knee joints were in good range of motion without any warmth swelling or effusion.  There was no tenderness over ankles or MTPs.   CDAI Exam: CDAI Score: -- Patient Global: --; Provider Global: -- Swollen: --; Tender: -- Joint Exam 03/10/2024   No joint exam  has been documented for this visit   There is currently no information documented on the homunculus. Go to the Rheumatology activity and complete the homunculus joint exam.  Investigation: No additional findings.  Imaging: No results found.  Recent Labs: Lab Results  Component Value Date   WBC 7.5 01/22/2024   HGB 14.2 01/22/2024   PLT 240 01/22/2024   NA 141 01/22/2024   K 4.4 01/22/2024   CL 106 01/22/2024   CO2 28 01/22/2024   GLUCOSE 93 01/22/2024   BUN 11 01/22/2024   CREATININE 0.80 01/22/2024   BILITOT 0.6 01/22/2024   ALKPHOS 86 10/07/2023   AST 19 01/22/2024   ALT 12 01/22/2024   PROT 6.8 01/22/2024   ALBUMIN 4.3 10/07/2023   CALCIUM 9.3 01/22/2024   GFRAA 96 07/26/2020   QFTBGOLDPLUS NEGATIVE 09/12/2020    Speciality Comments: PLQ Eye Exam: 02/26/2023 WNL @ [facility] Follow up 1 year Ttd with Fosamax  for 5 years in the past Fosamax  restarted 05/2020  Procedures:  No procedures performed Allergies: Boniva [ibandronate sodium], Gadolinium derivatives, Ivp dye [iodinated contrast media], Other, Risedronate sodium, Risedronate sodium [risedronate sodium], and Macrobid  [nitrofurantoin ]   Assessment / Plan:     Visit Diagnoses: Rheumatoid arthritis of multiple sites with negative rheumatoid factor (HCC) - RF negative, anti-CCP positive, ANA positive: Patient had no synovitis on the examination.  She denies having a flare of rheumatoid arthritis.  She continues to take methotrexate  and Plaquenil  on a regular basis.  High risk medication use - Methotrexate  8 tablets p.o. weekly, folic acid  2 mg p.o. daily, Plaquenil  200 mg p.o. twice daily Monday to Friday. PLQ Eye Exam: 02/26/2023.  January 22, 2024 CBC and CMP were normal.  She was advised to get labs every 3 months.  Information reimmunization was placed in the AVS.  She was advised to hold methotrexate  if she develops an infection resume after the infection resolves.  Sicca syndrome-sicca symptoms are  manageable with over-the-counter products.  Chronic pain of both shoulders-she had good range of motion of her shoulders without much discomfort.  Primary osteoarthritis of both knees -she denies discomfort today.  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  Chronic pain of left ankle - Followed  by Dr. Kit.  She was diagnosed with chronic Achilles tendinopathy.  Primary osteoarthritis of both feet-proper fitting shoes were advised.  Spondylosis of lumbar spine -she has intermittent pain in the lower back.  Core strength exercises were discussed.  She is followed by  Dr. Joshua and Dr. Bonner  Age related osteoporosis, unspecified pathological fracture presence - 08/11/22:The BMD Femur Neck Left is 0.656 g/cm2 with a T-score of -2.7.She restarted Fosamax  in January 2022 and discontinued in January 2025.  She is currently on drug holiday.  Patient had a detailed discussion regarding future therapy.  We discussed the option of anabolic agents like Tymlos or Forteo depending on her DEXA scan.  Other medical problems are listed as follows:  History of pulmonary embolism  Current use of long term anticoagulation-she is on long-term warfarin therapy.  Fatty liver  Raised intraocular pressure of both eyes  History of hyperlipidemia  History of migraine  Frequent UTI  Orders: No orders of the defined types were placed in this encounter.  No orders of the defined types were placed in this encounter.    Follow-Up Instructions: Return in about 3 months (around 06/10/2024) for Rheumatoid arthritis, Osteoarthritis, Osteoporosis.   Maya Nash, MD  Note - This record has been created using Animal nutritionist.  Chart creation errors have been sought, but may not always  have been located. Such creation errors do not reflect on  the standard of medical care.

## 2024-03-02 ENCOUNTER — Ambulatory Visit

## 2024-03-07 ENCOUNTER — Ambulatory Visit (INDEPENDENT_AMBULATORY_CARE_PROVIDER_SITE_OTHER): Admitting: Psychology

## 2024-03-07 DIAGNOSIS — Z62820 Parent-biological child conflict: Secondary | ICD-10-CM

## 2024-03-07 DIAGNOSIS — F411 Generalized anxiety disorder: Secondary | ICD-10-CM

## 2024-03-07 NOTE — Progress Notes (Signed)
 03/07/2024  Treatrment Plan:  Diagnosis F41.1 (Generalized anxiety disorder) [n/a]  Z62.820 (Parent child relationship problem) [n/a]  Symptoms Excessive and/or unrealistic worry that is difficult to control occurring more days than not for at least 6 months about a number of events or activities. (Status: maintained) -- No Description Entered  Regularly overindulges their child's wishes and demands. (Status: maintained) -- No Description Entered  Medication Status compliance  Safety none  If Suicidal or Homicidal State Action Taken: unspecified  Current Risk: low Medications unspecified Objectives Related Problem: Achieve a level of competent, effective parenting. Description: Increase the gradual letting go of their adolescent in constructive, affirmative ways. Target Date: 2024-05-14 Frequency: Daily Modality: individual Progress: 85%  Related Problem: Achieve a level of competent, effective parenting. Description: Identify unresolved childhood issues that affect parenting and work toward their resolution. Target Date: 2024-05-14 Frequency: Daily Modality: individual Progress: 90%  Related Problem: Achieve a level of competent, effective parenting. Description: Freely express feelings of frustration, helplessness, and inadequacy that each experiences in the parenting role. Target Date: 2024-05-14 Frequency: Daily Modality: individual Progress: 85%  Related Problem: Resolve the core conflict that is the source of anxiety. Description: Learn and implement problem-solving strategies for realistically addressing worries. Target Date: 2024-05-14 Frequency: Daily Modality: individual Progress: 85%  Related Problem: Resolve the core conflict that is the source  of anxiety. Description: Learn and implement calming skills to reduce overall anxiety and manage anxiety symptoms. Target Date: 2024-05-14 Frequency: Daily Modality: individual Progress: 75%  Related Problem: Resolve the core conflict that is the source of anxiety. Description: Describe situations, thoughts, feelings, and actions associated with anxieties and worries, their impact on functioning, and attempts to resolve them. Target Date: 2024-05-14 Frequency: Daily Modality: individual Progress: 75%  Client Response full compliance  Service Location Location, [address] [place], [place] [address]  Service Code cpt [phone]  Behavioral activation plan  Facilitate problem solving  Identify automatic thoughts  Emotion regulation skills  Provide education, information  Self care activities  Relaxation training  Lifestyle change (exercise, nutrition)  Self-monitoring  Validate/empathize  Session notes: F 41.1  Goals: She has chronic anxieties, mostly related to health concerns and her son's well-being. Would like to develop strategies to manage symptoms of anxiety. Needs to understand enmeshment and learn to define and manage appropriate family boundaries. Target date is 12-23. Patient has realized significant improvement in recognizing and establishing appropriate boundaries. Anxiety about health and family matters persist and she desires to continue treatment in an effort to manage and reduce these feelings. Revised target date is 12-25.  Meds: Xanax  (.25 mg);   Patient agrees to a video session and is aware of the limitations of this platform. She is at home and I am in my home office.   [name]: [name] says that she is experiencing high anxiety. This is mostly related to  being an immigrant. She does not want to move anywhere, but the current political climate makes her scared. In addition, her son's lack of happiness is a constant concern. We talked about how she can  best support her son without enabling. This is very difficult for her as they remain enmeshed. We emphasized importance of him getting help for therapy in [place]. She feels he may be ADHD.                                                                                                                                              CONI ALM KERNS, PhD 10:40a-11:30a 50 min

## 2024-03-09 ENCOUNTER — Ambulatory Visit (INDEPENDENT_AMBULATORY_CARE_PROVIDER_SITE_OTHER)

## 2024-03-09 ENCOUNTER — Ambulatory Visit: Admitting: Rheumatology

## 2024-03-09 DIAGNOSIS — Z7901 Long term (current) use of anticoagulants: Secondary | ICD-10-CM

## 2024-03-09 DIAGNOSIS — Z23 Encounter for immunization: Secondary | ICD-10-CM

## 2024-03-09 LAB — POCT INR: INR: 1.5 — AB (ref 2.0–3.0)

## 2024-03-09 NOTE — Patient Instructions (Addendum)
 Pre visit review using our clinic review tool, if applicable. No additional management support is needed unless otherwise documented below in the visit note.  Continue 5 mg (2 tablets) daily except take 2.5 mg (1 tablet) on Sundays and Thursdays and take 3.75 mg (1 1/2 tablets) on Tuesdays. Recheck in 4 weeks.

## 2024-03-09 NOTE — Progress Notes (Signed)
 Continue 5 mg (2 tablets) daily except take 2.5 mg (1 tablet) on Sundays and Thursdays and take 3.75 mg (1 1/2 tablets) on Tuesdays. Recheck in 4 weeks.   Pt requested regular dose flu vaccine. Educated pt on difference between high dose and regular dose. She requested this nurse talk to PCP. Dr. Charlett advised either would be ok for her. She advised pt should make that decision.   Administered regular dose flu vaccine. Pt tolerated well.

## 2024-03-10 ENCOUNTER — Ambulatory Visit: Attending: Rheumatology | Admitting: Rheumatology

## 2024-03-10 ENCOUNTER — Encounter: Payer: Self-pay | Admitting: Rheumatology

## 2024-03-10 VITALS — BP 106/77 | HR 53 | Temp 97.2°F | Resp 16 | Ht 62.5 in | Wt 163.8 lb

## 2024-03-10 DIAGNOSIS — M25512 Pain in left shoulder: Secondary | ICD-10-CM

## 2024-03-10 DIAGNOSIS — M3500 Sicca syndrome, unspecified: Secondary | ICD-10-CM | POA: Diagnosis not present

## 2024-03-10 DIAGNOSIS — Z8669 Personal history of other diseases of the nervous system and sense organs: Secondary | ICD-10-CM

## 2024-03-10 DIAGNOSIS — M25572 Pain in left ankle and joints of left foot: Secondary | ICD-10-CM

## 2024-03-10 DIAGNOSIS — M0609 Rheumatoid arthritis without rheumatoid factor, multiple sites: Secondary | ICD-10-CM

## 2024-03-10 DIAGNOSIS — M170 Bilateral primary osteoarthritis of knee: Secondary | ICD-10-CM

## 2024-03-10 DIAGNOSIS — G8929 Other chronic pain: Secondary | ICD-10-CM

## 2024-03-10 DIAGNOSIS — Z86711 Personal history of pulmonary embolism: Secondary | ICD-10-CM

## 2024-03-10 DIAGNOSIS — M810 Age-related osteoporosis without current pathological fracture: Secondary | ICD-10-CM

## 2024-03-10 DIAGNOSIS — N390 Urinary tract infection, site not specified: Secondary | ICD-10-CM

## 2024-03-10 DIAGNOSIS — M25511 Pain in right shoulder: Secondary | ICD-10-CM | POA: Diagnosis not present

## 2024-03-10 DIAGNOSIS — Z79899 Other long term (current) drug therapy: Secondary | ICD-10-CM

## 2024-03-10 DIAGNOSIS — H40053 Ocular hypertension, bilateral: Secondary | ICD-10-CM

## 2024-03-10 DIAGNOSIS — M19071 Primary osteoarthritis, right ankle and foot: Secondary | ICD-10-CM

## 2024-03-10 DIAGNOSIS — K760 Fatty (change of) liver, not elsewhere classified: Secondary | ICD-10-CM

## 2024-03-10 DIAGNOSIS — Z8639 Personal history of other endocrine, nutritional and metabolic disease: Secondary | ICD-10-CM

## 2024-03-10 DIAGNOSIS — Z7901 Long term (current) use of anticoagulants: Secondary | ICD-10-CM

## 2024-03-10 DIAGNOSIS — M47816 Spondylosis without myelopathy or radiculopathy, lumbar region: Secondary | ICD-10-CM

## 2024-03-10 DIAGNOSIS — M19072 Primary osteoarthritis, left ankle and foot: Secondary | ICD-10-CM

## 2024-03-10 NOTE — Patient Instructions (Signed)
 Standing Labs We placed an order today for your standing lab work.   Please have your standing labs drawn in December and every 3 months  Please have your labs drawn 2 weeks prior to your appointment so that the provider can discuss your lab results at your appointment, if possible.  Please note that you may see your imaging and lab results in MyChart before we have reviewed them. We will contact you once all results are reviewed. Please allow our office up to 72 hours to thoroughly review all of the results before contacting the office for clarification of your results.  WALK-IN LAB HOURS  Monday through Thursday from 8:00 am -12:30 pm and 1:00 pm-4:30 pm and Friday from 8:00 am-12:00 pm.  Patients with office visits requiring labs will be seen before walk-in labs.  You may encounter longer than normal wait times. Please allow additional time. Wait times may be shorter on  Monday and Thursday afternoons.  We do not book appointments for walk-in labs. We appreciate your patience and understanding with our staff.   Labs are drawn by [facility]. Please bring your co-pay at the time of your lab draw.  You may receive a bill from [facility] for your lab work.  Please note if you are on Hydroxychloroquine  and and an order has been placed for a Hydroxychloroquine  level,  you will need to have it drawn 4 hours or more after your last dose.  If you wish to have your labs drawn at another location, please call the office 24 hours in advance so we can fax the orders.  The office is located at [address], [place], [place] [address]   If you have any questions regarding directions or hours of operation,  please call [phone].   As a reminder, please drink plenty of water prior to coming for your lab work. Thanks!   Vaccines You are taking a medication(s) that can suppress your immune system.  The following immunizations are recommended: Flu annually ( high dose) Covid-19  RSV Td/Tdap  (tetanus, diphtheria, pertussis) every 10 years Pneumonia (Prevnar 15 then Pneumovax 23 at least 1 year apart.  Alternatively, can take Prevnar 20 without needing additional dose) Shingrix: 2 doses from 4 weeks to 6 months apart  Please check with your PCP to make sure you are up to date.   If you have signs or symptoms of an infection or start antibiotics: First, call your PCP for workup of your infection. Hold your medication through the infection, until you complete your antibiotics, and until symptoms resolve if you take the following: Injectable medication (Actemra, Benlysta, Cimzia, Cosentyx, Enbrel, Humira, Kevzara, Orencia, Remicade, Simponi, Stelara, Taltz, Tremfya) Methotrexate  Leflunomide (Arava) Mycophenolate (Cellcept) Xeljanz, Olumiant, or Rinvoq

## 2024-03-14 ENCOUNTER — Encounter: Payer: Self-pay | Admitting: Rheumatology

## 2024-03-14 DIAGNOSIS — M810 Age-related osteoporosis without current pathological fracture: Secondary | ICD-10-CM

## 2024-03-14 NOTE — Telephone Encounter (Signed)
 Okay to place DEXA order for [facility]? Patient is due in March 2026.

## 2024-03-14 NOTE — Telephone Encounter (Signed)
 yes

## 2024-03-29 ENCOUNTER — Encounter: Payer: Self-pay | Admitting: *Deleted

## 2024-03-30 ENCOUNTER — Encounter: Payer: Self-pay | Admitting: Internal Medicine

## 2024-03-30 ENCOUNTER — Ambulatory Visit: Attending: Internal Medicine | Admitting: Internal Medicine

## 2024-03-30 VITALS — BP 102/62 | HR 52 | Resp 16 | Ht 62.0 in | Wt 167.0 lb

## 2024-03-30 DIAGNOSIS — Z7189 Other specified counseling: Secondary | ICD-10-CM

## 2024-03-30 DIAGNOSIS — Z86718 Personal history of other venous thrombosis and embolism: Secondary | ICD-10-CM | POA: Diagnosis not present

## 2024-03-30 DIAGNOSIS — M7989 Other specified soft tissue disorders: Secondary | ICD-10-CM | POA: Diagnosis not present

## 2024-03-30 NOTE — Patient Instructions (Signed)
 Medication Instructions:  No Changes  Testing/Procedures: Your physician has requested that you have an echocardiogram. Echocardiography is a painless test that uses sound waves to create images of your heart. It provides your doctor with information about the size and shape of your heart and how well your heart's chambers and valves are working. This procedure takes approximately one hour. There are no restrictions for this procedure. Please do NOT wear cologne, perfume, aftershave, or lotions (deodorant is allowed). Please arrive 15 minutes prior to your appointment time.  Please note: We ask at that you not bring children with you during ultrasound (echo/ vascular) testing. Due to room size and safety concerns, children are not allowed in the ultrasound rooms during exams. Our front office staff cannot provide observation of children in our lobby area while testing is being conducted. An adult accompanying a patient to their appointment will only be allowed in the ultrasound room at the discretion of the ultrasound technician under special circumstances. We apologize for any inconvenience.     Dr. Soyla Merck has ordered a CT coronary calcium score.   Test locations:  [facility] at [facility]  [facility] at [facility]  This is $99 out of pocket.   Coronary CalciumScan A coronary calcium scan is an imaging test used to look for deposits of calcium and other fatty materials (plaques) in the inner lining of the blood vessels of the heart (coronary arteries). These deposits of calcium and plaques can partly clog and narrow the coronary arteries without producing any symptoms or warning signs. This puts a person at risk for a heart attack. This test can detect these deposits before symptoms develop. Tell a health care provider about: Any allergies you have. All medicines you are  taking, including vitamins, herbs, eye drops, creams, and over-the-counter medicines. Any problems you or family members have had with anesthetic medicines. Any blood disorders you have. Any surgeries you have had. Any medical conditions you have. Whether you are pregnant or may be pregnant. What are the risks? Generally, this is a safe procedure. However, problems may occur, including: Harm to a pregnant woman and her unborn baby. This test involves the use of radiation. Radiation exposure can be dangerous to a pregnant woman and her unborn baby. If you are pregnant, you generally should not have this procedure done. Slight increase in the risk of cancer. This is because of the radiation involved in the test. What happens before the procedure? No preparation is needed for this procedure. What happens during the procedure? You will undress and remove any jewelry around your neck or chest. You will put on a hospital gown. Sticky electrodes will be placed on your chest. The electrodes will be connected to an electrocardiogram (ECG) machine to record a tracing of the electrical activity of your heart. A CT scanner will take pictures of your heart. During this time, you will be asked to lie still and hold your breath for 2-3 seconds while a picture of your heart is being taken. The procedure may vary among health care providers and hospitals. What happens after the procedure? You can get dressed. You can return to your normal activities. It is up to you to get the results of your test. Ask your health care provider, or the department that is doing the test, when your results will be ready. Summary A coronary calcium scan is an imaging test used to look  for deposits of calcium and other fatty materials (plaques) in the inner lining of the blood vessels of the heart (coronary arteries). Generally, this is a safe procedure. Tell your health care provider if you are pregnant or may be pregnant. No  preparation is needed for this procedure. A CT scanner will take pictures of your heart. You can return to your normal activities after the scan is done. This information is not intended to replace advice given to you by your health care provider. Make sure you discuss any questions you have with your health care provider. Document Released: 11/01/2007 Document Revised: 03/24/2016 Document Reviewed: 03/24/2016 Elsevier Interactive Patient Education  2017 [employer].   Follow-Up: At [facility], you and your health needs are our priority.  As part of our continuing mission to provide you with exceptional heart care, our providers are all part of one team.  This team includes your primary Cardiologist (physician) and Advanced Practice Providers or APPs (Physician Assistants and Nurse Practitioners) who all work together to provide you with the care you need, when you need it.  Your next appointment:    05/10/2024 at 8:00 am  Provider:   Soyla DELENA Merck, MD   Other Instructions Please wear Medium Strength Compression Socks (see handout); wear during the day only, remove at bedtime

## 2024-03-30 NOTE — Progress Notes (Signed)
 Cardiology Office Note:  .   Date:  03/30/2024  ID:  Ermalinda New, DOB [birth year], MRN [id number] PCP: Charlett Apolinar POUR, MD  [facility] Providers Cardiologist:  Soyla DELENA Merck, MD    History of Present Illness: .   [name] is a 63 y.o. female.  Discussed the use of AI scribe software for clinical note transcription with the patient, who gave verbal consent to proceed.  History of Present Illness Marieelena Bartko is a 63 year old female with rheumatoid arthritis and a history of pulmonary embolism who presents for a cardiovascular evaluation due to dizziness and swelling in the legs.  She experiences dizziness more than once a month, often triggered by rapid position changes. The dizziness is described as lightheadedness and can last from a few minutes to a full day. Her blood pressure is generally low, often under 100, and dizziness sometimes occurs without a significant drop in blood pressure.  She reports swelling in her left ankle, which has been present for the last two years. She attributes this to weight gain and has tendinitis in the left ankle, limiting her ability to walk and stand for long periods.  She has a history of a pulmonary embolism in 2006 and has been on anticoagulation therapy since then, currently taking warfarin with a target INR of 1.5 to 2.0. She has not had any recurrent clots or bleeding episodes.  She experienced chest pain during exercise about ten years ago, which was evaluated and attributed to chest wall pain rather than a cardiac issue. She does not currently experience chest pain or shortness of breath with activity, although she limits her exercise due to knee pain and tendinitis.    ROS: negative except per HPI above.  Studies Reviewed: SABRA   EKG Interpretation Date/Time:  Wednesday March 30 2024 09:42:45 EST Ventricular Rate:  55 PR Interval:  186 QRS Duration:  94 QT Interval:  422 QTC Calculation: 403 R  Axis:   59  Text Interpretation: Sinus bradycardia Low voltage QRS When compared with ECG of 28-May-2018 01:56, PREVIOUS ECG IS PRESENT Confirmed by Merck Soyla ([id number]) on 03/30/2024 9:49:48 AM    Results DIAGNOSTIC Echocardiogram: No abnormalities (2020) EKG: Normal (03/30/2024) Risk Assessment/Calculations:       Physical Exam:   VS:  BP 102/62 (BP Location: Right Arm, Patient Position: Sitting, Cuff Size: Normal)   Pulse (!) 52   Resp 16   Ht 5' 2 (1.575 m)   Wt 167 lb (75.8 kg)   SpO2 98%   BMI 30.54 kg/m    Wt Readings from Last 3 Encounters:  03/30/24 167 lb (75.8 kg)  03/10/24 163 lb 12.8 oz (74.3 kg)  02/17/24 166 lb 6.4 oz (75.5 kg)     Physical Exam GENERAL: Alert, cooperative, well developed, no acute distress. HEENT: Normocephalic, normal oropharynx, moist mucous membranes. CHEST: Clear to auscultation bilaterally, no wheezes, rhonchi, or crackles. CARDIOVASCULAR: Normal heart rate and rhythm, S1 and S2 normal without murmurs. ABDOMEN: Soft, non-tender, non-distended, without organomegaly, normal bowel sounds. EXTREMITIES: Swelling in ankles, left greater than right, no cyanosis. NEUROLOGICAL: Cranial nerves grossly intact, moves all extremities without gross motor or sensory deficit.   ASSESSMENT AND PLAN: .    Assessment and Plan Assessment & Plan Cardiac risk assessment and evaluation of lower extremity swelling Intermittent dizziness and orthostatic lightheadedness possibly due to blood pressure changes. Lower extremity swelling likely from venous insufficiency. No significant cardiac abnormalities on previous echocardiogram. Anxiety may contribute to symptoms. -  Ordered coronary calcium CT for cardiac risk assessment. - Ordered echocardiogram to evaluate heart function in setting of bilateral LE edema.  - Advised wearing medium strength compression socks.  Venous insufficiency of lower extremities Swelling likely due to venous insufficiency,  exacerbated by weight gain and prolonged standing. - Advised wearing medium strength compression socks.  History of pulmonary embolism on chronic anticoagulation Pulmonary embolism in 2006, on chronic anticoagulation with warfarin. No recent clotting or bleeding. Discussed potential switch to apixaban , but current regimen effective and well-tolerated. - Continue current anticoagulation regimen with warfarin. Can discuss further with PCP or hematology if switch desired.  Dizziness and orthostatic lightheadedness Intermittent dizziness and orthostatic lightheadedness possibly related to blood pressure changes. Symptoms occur with position changes and are not severe. - Advised wearing medium strength compression socks. - can increase fluid intake.  Recording duration: 29 minutes      Soyla Merck, MD, FACC

## 2024-04-06 ENCOUNTER — Ambulatory Visit (INDEPENDENT_AMBULATORY_CARE_PROVIDER_SITE_OTHER)

## 2024-04-06 DIAGNOSIS — Z7901 Long term (current) use of anticoagulants: Secondary | ICD-10-CM | POA: Diagnosis not present

## 2024-04-06 LAB — POCT INR: INR: 1.6 — AB (ref 2.0–3.0)

## 2024-04-06 NOTE — Progress Notes (Signed)
 Indication: PE 2006 Continue 5 mg (2 tablets) daily except take 2.5 mg (1 tablet) on Sundays and Thursdays and take 3.75 mg (1 1/2 tablets) on Tuesdays. Recheck in 7 weeks per pt request.

## 2024-04-06 NOTE — Patient Instructions (Addendum)
 Pre visit review using our clinic review tool, if applicable. No additional management support is needed unless otherwise documented below in the visit note.  Continue 5 mg (2 tablets) daily except take 2.5 mg (1 tablet) on Sundays and Thursdays and take 3.75 mg (1 1/2 tablets) on Tuesdays. Recheck in 7 weeks.

## 2024-04-08 ENCOUNTER — Ambulatory Visit: Admitting: Family Medicine

## 2024-04-08 ENCOUNTER — Telehealth: Payer: Self-pay

## 2024-04-08 VITALS — BP 110/78 | HR 70 | Temp 98.2°F | Wt 162.8 lb

## 2024-04-08 DIAGNOSIS — Z20822 Contact with and (suspected) exposure to covid-19: Secondary | ICD-10-CM

## 2024-04-08 LAB — POC COVID19 BINAXNOW: SARS Coronavirus 2 Ag: NEGATIVE

## 2024-04-08 MED ORDER — NIRMATRELVIR/RITONAVIR (PAXLOVID)TABLET: 3.0000 | ORAL_TABLET | Freq: Two times a day (BID) | ORAL | 0 refills | Status: AC

## 2024-04-08 NOTE — Telephone Encounter (Signed)
 Pt LVM earlier today reporting her husband tested positive for COVID so she would have an apt this afternoon with a provider at [facility] for testing and assessment.   She was tested at the clinic today and was negative. She was advised to test at home daily for the next 7 days or until she tests positive. She was prescribed paxlovid  incase she does test positive. She is inquiring if warfarin dosing should be adjusted due to potential interaction with paxlovid . Next week is a holiday and nurse will be out of the office. Advised pt would not want to adjust warfarin dosing unless she had INR check due to variances in interaction with this medication. Can cause INR to go up or down or nothing at all.   Advised pt if she tests positive over the weekend LVM and this nurse will contact her and schedule  her for a nurse visit to have a POCT INR. Pt reports if she does not test positive she will f/u with coumadin  clinic the week after next. Pt verbalized understanding.

## 2024-04-08 NOTE — Progress Notes (Signed)
   Subjective:    Patient ID: [name], female    DOB: 03-20-1961, 63 y.o.   MRN: [id number]  HPI Here with concerns about exposure to Covid. Her husband developed a cough and a fever several days ago, and he tested positive for Covid yesterday. He was started on Molnupiravir, and he feels a little better today. Now she has had a slightly itchy throat since yesterday, but her throat is not sore. No other symptoms. She and her husband have been keeping away from each other the past 2 days.    Review of Systems  Constitutional: Negative.   HENT: Negative.    Eyes: Negative.   Respiratory: Negative.         Objective:   Physical Exam Constitutional:      Appearance: Normal appearance.  HENT:     Right Ear: Tympanic membrane, ear canal and external ear normal.     Left Ear: Tympanic membrane, ear canal and external ear normal.     Nose: Nose normal.     Mouth/Throat:     Pharynx: Oropharynx is clear.  Eyes:     Conjunctiva/sclera: Conjunctivae normal.  Pulmonary:     Effort: Pulmonary effort is normal.     Breath sounds: Normal breath sounds.  Lymphadenopathy:     Cervical: No cervical adenopathy.  Neurological:     Mental Status: She is alert.    She tested negative for Covid here today.        Assessment & Plan:  Exposure to the Covid virus. Since the weekend is approaching, we will send in a RX for Paxlovid  to her pharmacy. She will test herself for Covid every day for the next weeks or so, and she will start on the Paxlovid  if she ever tests positive. She will report back to us  next week.  Garnette Olmsted, MD

## 2024-04-08 NOTE — Addendum Note (Signed)
 Addended by: LADONNA INOCENTE SAILOR on: 04/08/2024 04:00 PM   Modules accepted: Orders

## 2024-04-11 ENCOUNTER — Ambulatory Visit: Payer: Self-pay

## 2024-04-11 ENCOUNTER — Encounter: Payer: Self-pay | Admitting: Rheumatology

## 2024-04-11 ENCOUNTER — Ambulatory Visit (INDEPENDENT_AMBULATORY_CARE_PROVIDER_SITE_OTHER): Admitting: *Deleted

## 2024-04-11 DIAGNOSIS — Z7901 Long term (current) use of anticoagulants: Secondary | ICD-10-CM | POA: Diagnosis not present

## 2024-04-11 LAB — POCT INR: INR: 1.4 — AB (ref 2.0–3.0)

## 2024-04-11 NOTE — Telephone Encounter (Signed)
 Copied from CRM #[id number]. Topic: General - Other >> Apr 11, 2024  2:19 PM Lauren C wrote: Reason for CRM: Pt states she was supposed to receive call from nurse to schedule nurse visit to have a POCT INR at coumadin  clinic due to concerns about warfarin and paxlovid  interaction. Please call pt at [phone]

## 2024-04-11 NOTE — Progress Notes (Signed)
 Indication: PE 2006 Pt tested positive for COVID on 11/22 and started Paxlovid . This has a potentia for interactions. Pt reported pharmacist is concerned of interaction and recommended INR check. Pt is doing well with symptoms and keeping in contact with office if any symptoms worsen.  Pt went to clinic today at [facility] for a NV for INR check. Result was relayed to this nurse for dosing.  Contacted pt by phone and advised of dosing and rescheduled next coumadin  clinic apt for 3 weeks. Pt verbalized understanding.  Increase dose today to take 6.25 mg (2 1/2 tablets) and then continue 5 mg (2 tablets) daily except take 2.5 mg (1 tablet) on Sundays and Thursdays and take 3.75 mg (1 1/2 tablets) on Tuesdays. Recheck in 3 weeks.

## 2024-04-11 NOTE — Progress Notes (Signed)
 Per orders of Dr. Theophilus, INR given by Keanthony Poole. Patient tolerated injection well.

## 2024-04-11 NOTE — Telephone Encounter (Signed)
 Pt [name] she started Paxlovid  on 11/25 when she tested positive for COVID. She reports pharmacy is concerned with warfarin and Paxlovid  interaction.   Contacted pt and advised a NV to have a CMA test her INR and result will be relayed to this nurse. Advised this nurse will f/u with her once the result is received. Pt verbalized understanding.

## 2024-04-11 NOTE — Patient Instructions (Addendum)
 Pre visit review using our clinic review tool, if applicable. No additional management support is needed unless otherwise documented below in the visit note.  Increase dose today to take 6.25 mg (2 1/2 tablets) and then continue 5 mg (2 tablets) daily except take 2.5 mg (1 tablet) on Sundays and Thursdays and take 3.75 mg (1 1/2 tablets) on Tuesdays. Recheck in 3 weeks.

## 2024-04-11 NOTE — Telephone Encounter (Signed)
 Attempted to reach pt. Left a voicemail to call us back.

## 2024-04-18 ENCOUNTER — Other Ambulatory Visit: Payer: Self-pay

## 2024-04-18 ENCOUNTER — Ambulatory Visit: Admitting: Psychology

## 2024-04-18 DIAGNOSIS — F411 Generalized anxiety disorder: Secondary | ICD-10-CM | POA: Diagnosis not present

## 2024-04-18 DIAGNOSIS — Z62820 Parent-biological child conflict: Secondary | ICD-10-CM

## 2024-04-18 DIAGNOSIS — Z79899 Other long term (current) drug therapy: Secondary | ICD-10-CM

## 2024-04-18 DIAGNOSIS — M0609 Rheumatoid arthritis without rheumatoid factor, multiple sites: Secondary | ICD-10-CM

## 2024-04-18 DIAGNOSIS — M3500 Sicca syndrome, unspecified: Secondary | ICD-10-CM

## 2024-04-18 MED ORDER — METHOTREXATE SODIUM 2.5 MG PO TABS: 20.0000 mg | ORAL_TABLET | ORAL | 0 refills | Status: AC

## 2024-04-18 NOTE — Progress Notes (Signed)
 04/18/2024  Treatrment Plan:  Diagnosis F41.1 (Generalized anxiety disorder) [n/a]  Z62.820 (Parent child relationship problem) [n/a]  Symptoms Excessive and/or unrealistic worry that is difficult to control occurring more days than not for at least 6 months about a number of events or activities. (Status: maintained) -- No Description Entered  Regularly overindulges their child's wishes and demands. (Status: maintained) -- No Description Entered  Medication Status compliance  Safety none  If Suicidal or Homicidal State Action Taken: unspecified  Current Risk: low Medications unspecified Objectives Related Problem: Achieve a level of competent, effective parenting. Description: Increase the gradual letting go of their adolescent in constructive, affirmative ways. Target Date: 2025-05-14 Frequency: Daily Modality: individual Progress: 95%  Related Problem: Achieve a level of competent, effective parenting. Description: Identify unresolved childhood issues that affect parenting and work toward their resolution. Target Date: 2025-05-14 Frequency: Daily Modality: individual Progress: 100%  Related Problem: Achieve a level of competent, effective parenting. Description: Freely express feelings of frustration, helplessness, and inadequacy that each experiences in the parenting role. Target Date: 2025-05-14 Frequency: Daily Modality: individual Progress: 85%  Related Problem: Resolve the core conflict that is the source of anxiety. Description: Learn and implement problem-solving strategies for realistically addressing worries. Target Date: 2025-05-14 Frequency: Daily Modality: individual Progress: 85%  Related Problem: Resolve the  core conflict that is the source of anxiety. Description: Learn and implement calming skills to reduce overall anxiety and manage anxiety symptoms. Target Date: 2025-05-14 Frequency: Daily Modality: individual Progress: 75%  Related Problem: Resolve the core conflict that is the source of anxiety. Description: Describe situations, thoughts, feelings, and actions associated with anxieties and worries, their impact on functioning, and attempts to resolve them. Target Date: 2025-05-14 Frequency: Daily Modality: individual Progress: 85%  Client Response full compliance  Service Location Location, [address] [place], [place] [address]  Service Code cpt [phone]  Behavioral activation plan  Facilitate problem solving  Identify automatic thoughts  Emotion regulation skills  Provide education, information  Self care activities  Relaxation training  Lifestyle change (exercise, nutrition)  Self-monitoring  Validate/empathize  Session notes: F 41.1  Goals: She has chronic anxieties, mostly related to health concerns and her son's well-being. Would like to develop strategies to manage symptoms of anxiety. Needs to understand enmeshment and learn to define and manage appropriate family boundaries. Target date is 12-23. Patient has realized significant improvement in recognizing and establishing appropriate boundaries. Anxiety about health and family matters persist and she desires to continue treatment in an effort to manage and reduce these feelings. Revised target date is 12-26.  Meds: Xanax  (.25 mg);   Patient agrees to a video session and is aware of the limitations of this platform. She is at home and I am in my home office.  [name] says she and her husband got Covid 19. Symptoms are minimal, but had to cancel Thanksgiving celebration. Son will come home for the Christmas holiday. She expressed her concern that her son [name] is not improving and is still socially  isolated. She feels he has given up dating or being invested in having a life outside of work. [name] reports feeling helpless to effect change with regard to her son's situation. Talked about the need to let go and not assume that responsibility.                                                                                                                                                  CONI ALM KERNS, PhD 10:40a-11:30a 50 min

## 2024-04-18 NOTE — Telephone Encounter (Signed)
 Last Fill: 09/07/2023  Labs: 01/22/2024 CBC and CMP are normal.   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 weekly   Okay to refill Methotrexate ?

## 2024-04-19 ENCOUNTER — Ambulatory Visit: Payer: Self-pay

## 2024-04-19 NOTE — Telephone Encounter (Signed)
 Reach out to pt. Pt reports fever of 99 last night, took Tylenol , runny nose, feels like having heavy cold, little cough, no sob, body feels very cold, head feel hot, weakness. Took covid test this morning, was strong positive. When check for covid last time, it was weak positive.  Pt would like an advise from provider. Pt also would like a letter to exempt from [employer] in 2 wks. Schedule an appt with Dr. Charlett tomorrow for 915am for virtual.   Please review message below for more information.

## 2024-04-19 NOTE — Telephone Encounter (Signed)
 FYI Only or Action Required?: Action required by provider: request for appointment and update on patient condition.  Patient was last seen in primary care on 04/08/2024 by Johnny Garnette LABOR, MD.  Called Nurse Triage reporting Fever, Fatigue, Chills, Nasal Congestion, Headache, and Covid Positive.  Symptoms began 2 weeks ago.  Interventions attempted: OTC medications: Tylenol  and nasal spray, Prescription medications: completed Paxlovid , and Rest, hydration, or home remedies.  Symptoms are: gradually worsening.  Triage Disposition: See Physician Within 24 Hours  Patient/caregiver understands and will follow disposition?: No, wishes to speak with PCP                               1. COVID-19 ONSET: When did the symptoms of COVID-19 first start?     2 weeks ago 2. DIAGNOSIS CONFIRMATION: How do you know you have had COVID-19? (e.g., positive lab test or self- test, diagnosed by doctor or NP/PA, symptoms after exposure)     Yes, states she tested positive via multiple home tests 3. MAIN SYMPTOM:  What is your main concern or symptom right now? (e.g., breathing difficulty, cough, fatigue. loss of smell)     Generalized fatigue/weakness/feeling sick, low grade fever, dry mouth 4. MAIN SYMPTOM ONSET: When did the symptoms start?     Yesterday  5. BETTER-SAME-WORSE: Are you getting better, staying the same, or getting worse over the last 1 to 2 weeks?     States she briefly got better after completing Paxlovid , symptoms returned and worsened yesterday  6. BREATHING DIFFICULTY: Are you having any trouble breathing? If Yes, ask: How bad is your breathing? (e.g., mild, moderate, severe)      Denies  7. O2 SATURATION MONITOR:  Do you use an oxygen saturation monitor (pulse oximeter) at home? If Yes, ask What is your reading (oxygen level) today? What is your usual oxygen saturation reading? (e.g., 95%)     99% while on phone with this RN 8. OTHER  SYMPTOMS: Do you have any other symptoms?  (e.g., cough, fatigue, fever, headache, muscle pain, shortness of breath, weakness)     Chills, mild headache yesterday, nasal congestion Denies chest pain 9. RECENT MEDICAL VISIT: Have you been seen by a healthcare provider (doctor, NP, PA) for these persisting COVID-19 symptoms? If Yes, ask: When were you seen? (e.g., date)     Yes, 04/08/24 9. HIGH RISK DISEASE: Do you have any chronic medical problems? (e.g., asthma, heart or lung disease, weak immune system, obesity, etc.)     RA- immunosuppressed    This RN advised in-person evaluation within 24 hours. No availability with PCP within recommended disposition. Patient declined available appointments in PCP office with alternate providers. Patient stated she is a complex patient and her PCP typically makes time to see her. Please advise. Patient would like a call back today.   Copied from CRM #[id number]. Topic: Clinical - Red Word Triage >> Apr 19, 2024  8:58 AM Rea ORN wrote: Red Word that prompted transfer to Nurse Triage: Pt wonders if she still has Covid. Sx began 10 days ago. Pt took paxlovid  and finished rx but still testing positive. She currently having fever, nasal drainage, tired, mouth feels funny  Reason for Disposition  [1] Persisting symptoms of COVID-19 AND [2] symptoms WORSE  Protocols used: COVID-19 - Persisting Symptoms Follow-up Call-[facility]

## 2024-04-20 ENCOUNTER — Telehealth: Admitting: Internal Medicine

## 2024-04-20 ENCOUNTER — Encounter: Payer: Self-pay | Admitting: Internal Medicine

## 2024-04-20 VITALS — Temp 97.8°F | Wt 162.0 lb

## 2024-04-20 DIAGNOSIS — U071 COVID-19: Secondary | ICD-10-CM

## 2024-04-20 DIAGNOSIS — J3489 Other specified disorders of nose and nasal sinuses: Secondary | ICD-10-CM | POA: Diagnosis not present

## 2024-04-20 DIAGNOSIS — Z789 Other specified health status: Secondary | ICD-10-CM | POA: Diagnosis not present

## 2024-04-20 MED ORDER — AZELASTINE HCL 0.1 % NA SOLN: 2.0000 | Freq: Two times a day (BID) | NASAL | 1 refills | Status: DC

## 2024-04-20 NOTE — Telephone Encounter (Signed)
 Had visit this am virtual

## 2024-04-20 NOTE — Progress Notes (Signed)
 Virtual Visit via Video Note  I connected with [name] on 04/20/24 at  9:15 AM EST by a video enabled telemedicine application and verified that I am speaking with the correct person using two identifiers. Location patient: home Location provider:work office Persons participating in the virtual visit: patient, provider   Patient aware  of the limitations of evaluation and management by telemedicine and  availability of in person appointments. and agreed to proceed.   HPI: [name] presents for video visit Husband got sick first   and then she was positive  given paxlovid   went away in 2 days and then  5 days after dosing  got congestion  fever was 99.5  used tylenol  ;no fever  this am but runny nose  and mild cough .   Still  positive test after negative . ? Any thing to help.  Needs jury letter to be excused  from December  doesn't t hink will be well enough by then  Has ? About son visiting dec 19  should he cancel?   ROS: See pertinent positives and negatives per HPI.  Past Medical History:  Diagnosis Date   Adjustment disorder with anxiety    B12 deficiency due to diet    is a vegetarian   Chest pain, unspecified    Chronic anticoagulation    Fatty liver    Per patient   GERD (gastroesophageal reflux disease)    Headache(784.0)    migraines and head pain   Hx of varicella    As Child   Iron deficiency anemia, unspecified    Otalgia, unspecified    Other and unspecified hyperlipidemia    Palpitations    Personal history of venous thrombosis and embolism    Recurrent UTI    Rheumatoid arthritis(714.0)    deveshewar   Unspecified vitamin D  deficiency    UTI (lower urinary tract infection) 11/28/2011   citrobacter       Past Surgical History:  Procedure Laterality Date   ORIF ULNAR FRACTURE     TYMPANOSTOMY TUBE PLACEMENT  2009   Right    Family History  Problem Relation Age of Onset   Parkinsonism Mother    Dementia Father    Stroke  Father    Hyperlipidemia Father    Diabetes Father    Lymphoma Other    Colon cancer Neg Hx    Colon polyps Neg Hx    Esophageal cancer Neg Hx    Stomach cancer Neg Hx    Rectal cancer Neg Hx    Breast cancer Neg Hx     Social History   Tobacco Use   Smoking status: Never    Passive exposure: Past   Smokeless tobacco: Never  Vaping Use   Vaping status: Never Used  Substance Use Topics   Alcohol use: No   Drug use: Never      Current Outpatient Medications:    azelastine (ASTELIN) 0.1 % nasal spray, Place 2 sprays into both nostrils 2 (two) times daily. Use in each nostril as directed, Disp: 30 mL, Rfl: 1   AZO D-MANNOSE PO, Take by mouth., Disp: , Rfl:    Calcium Carb-Cholecalciferol 600-800 MG-UNIT TABS, Take by mouth., Disp: , Rfl:    Cholecalciferol (VITAMIN D3) 2000 UNITS TABS, Take 1 tablet by mouth daily., Disp: , Rfl:    Cyanocobalamin  (VITAMIN B-12 PO), Take by mouth once a week., Disp: , Rfl:    cycloSPORINE (RESTASIS) 0.05 % ophthalmic emulsion, Place 1 drop  into both eyes daily., Disp: , Rfl:    diclofenac Sodium (VOLTAREN) 1 % GEL, Apply topically 4 (four) times daily., Disp: , Rfl:    Eflornithine HCl 13.9 % cream, Apply topically daily. , Disp: , Rfl:    folic acid  (FOLVITE ) 1 MG tablet, TAKE 2 TABLETS(2 MG) BY MOUTH DAILY, Disp: 180 tablet, Rfl: 3   hydroxychloroquine  (PLAQUENIL ) 200 MG tablet, TAKE 1 TABLET BY MOUTH TWICE DAILY, MONDAY THROUGH FRIDAY ONLY, Disp: 120 tablet, Rfl: 0   mineral/vitamin supplement (MULTIGEN) 70 MG TABS tablet, TAKE 1 TABLET (70 MG TOTAL) BY MOUTH DAILY., Disp: 90 tablet, Rfl: 1   Omeprazole  Magnesium (PRILOSEC PO), Take by mouth 3 (three) times a week., Disp: , Rfl:    Polyethylene Glycol 3350 (MIRALAX PO), Take by mouth as needed. (Patient taking differently: Take by mouth as needed. Every other night), Disp: , Rfl:    pregabalin  (LYRICA ) 50 MG capsule, TAKE 1 CAPSULE(50 MG) BY MOUTH DAILY EVERY MORNING AND EVERY NIGHT AT BEDTIME,  Disp: 180 capsule, Rfl: 1   SUMAtriptan  (IMITREX ) 50 MG tablet, TAKE ONE TABLET BY MOUTH AS NEEDED. MAY REPEAT IN 2 TO 4 HOURS IF NEEDED., Disp: 9 tablet, Rfl: 1   warfarin (COUMADIN ) 2.5 MG tablet, TAKE 2 TABLETS BY MOUTH DAILY, EXCEPT TAKE  1 TABLET ON SUNDAYS AND THURSDAYS AND TAKE 1 1/2 TABLETS ON TUESDAYS OR AS DIRECTED BY ANTICOAGULATION CLINIC, Disp: 180 tablet, Rfl: 1   ALPRAZolam  (XANAX ) 0.25 MG tablet, Take 1 tablet (0.25 mg total) by mouth 2 (two) times daily as needed for anxiety. (Patient not taking: Reported on 04/20/2024), Disp: 20 tablet, Rfl: 0   methotrexate  (RHEUMATREX) 2.5 MG tablet, Take 8 tablets (20 mg total) by mouth once a week. (Patient not taking: Reported on 04/20/2024), Disp: 96 tablet, Rfl: 0  EXAM: BP Readings from Last 3 Encounters:  04/08/24 110/78  03/30/24 102/62  03/10/24 106/77    VITALS per patient if applicable:  GENERAL: alert, oriented, appears well and in no acute distress non toxic  but congested and runny nose  no stridor  ocass minor cough   HEENT: atraumatic, conjunttiva clear, no obvious abnormalities on inspection of external nose and ears  NECK: normal movements of the head and neck  LUNGS: on inspection no signs of respiratory distress, breathing rate appears normal, no obvious gross SOB, gasping or wheezing  CV: no obvious cyanosis  MS: moves all visible extremities without noticeable abnormality  PSYCH/NEURO: pleasant and cooperative, no obvious depression or anxiety, speech and thought processing grossly intact Lab Results  Component Value Date   WBC 7.5 01/22/2024   HGB 14.2 01/22/2024   HCT 43.5 01/22/2024   PLT 240 01/22/2024   GLUCOSE 93 01/22/2024   CHOL 214 (H) 10/07/2023   TRIG 139.0 10/07/2023   HDL 75.50 10/07/2023   LDLDIRECT 97.3 05/25/2013   LDLCALC 111 (H) 10/07/2023   ALT 12 01/22/2024   AST 19 01/22/2024   NA 141 01/22/2024   K 4.4 01/22/2024   CL 106 01/22/2024   CREATININE 0.80 01/22/2024   BUN 11  01/22/2024   CO2 28 01/22/2024   TSH 3.30 10/07/2023   INR 1.4 (A) 04/11/2024   HGBA1C 6.0 10/07/2023   MICROALBUR <0.7 10/07/2023    ASSESSMENT AND PLAN:  Discussed the following assessment and plan:    ICD-10-CM   1. COVID-19 virus infection  U07.1     2. Rhinorrhea  J34.89     3. Medically complex patient  Z78.9  No obv bacteria infection or complication but  testing staying pos off and on prob from rebound paxlovid  and  immune suppressed as planned  Counseled.  Son should not have to cancel trip  Ok to try astelin for nasal  although study was for  prevention than rx but worth a try .   Letter  for jury excusal  for   illness   Expectant management and discussion of plan and treatment with opportunity to ask questions and all were answered. The patient agreed with the plan and demonstrated an understanding of the instructions.  32 minutes  visit counsel and letter communication for jury excuse  Advised to call back or seek an in-person evaluation if worsening  or having  further concerns  in interim. Return if symptoms worsen or fail to improve as expected.    Apolinar Eastern, MD

## 2024-04-21 ENCOUNTER — Encounter: Payer: Self-pay | Admitting: Internal Medicine

## 2024-04-26 NOTE — Telephone Encounter (Signed)
 Follow up with pt. Pt states to disregard the message. She is feeling better now. Still have some cough. Other sx almost gone. Can follow up with Dr. Charlett next week if cough is worse. Pt reports concerns on redness on both breasts. Set up appt next week to be evaluated by Dr. Charlett.

## 2024-05-02 ENCOUNTER — Ambulatory Visit: Admitting: Psychology

## 2024-05-02 ENCOUNTER — Ambulatory Visit

## 2024-05-02 DIAGNOSIS — Z7901 Long term (current) use of anticoagulants: Secondary | ICD-10-CM

## 2024-05-02 DIAGNOSIS — Z62820 Parent-biological child conflict: Secondary | ICD-10-CM | POA: Diagnosis not present

## 2024-05-02 DIAGNOSIS — F411 Generalized anxiety disorder: Secondary | ICD-10-CM | POA: Diagnosis not present

## 2024-05-02 LAB — POCT INR: INR: 1.6 — AB (ref 2.0–3.0)

## 2024-05-02 NOTE — Patient Instructions (Addendum)
 Pre visit review using our clinic review tool, if applicable. No additional management support is needed unless otherwise documented below in the visit note.  Continue 5 mg (2 tablets) daily except take 2.5 mg (1 tablet) on Sundays and Thursdays and take 3.75 mg (1 1/2 tablets) on Tuesdays. Recheck in 4 weeks.

## 2024-05-02 NOTE — Progress Notes (Signed)
 Indication: PE 2006  Continue 5 mg (2 tablets) daily except take 2.5 mg (1 tablet) on Sundays and Thursdays and take 3.75 mg (1 1/2 tablets) on Tuesdays. Recheck in 4 weeks.

## 2024-05-02 NOTE — Progress Notes (Signed)
 05/02/2024  Treatrment Plan:  Diagnosis F41.1 (Generalized anxiety disorder) [n/a]  Z62.820 (Parent child relationship problem) [n/a]  Symptoms Excessive and/or unrealistic worry that is difficult to control occurring more days than not for at least 6 months about a number of events or activities. (Status: maintained) -- No Description Entered  Regularly overindulges their child's wishes and demands. (Status: maintained) -- No Description Entered  Medication Status compliance  Safety none  If Suicidal or Homicidal State Action Taken: unspecified  Current Risk: low Medications unspecified Objectives Related Problem: Achieve a level of competent, effective parenting. Description: Increase the gradual letting go of their adolescent in constructive, affirmative ways. Target Date: 2025-05-14 Frequency: Daily Modality: individual Progress: 95%  Related Problem: Achieve a level of competent, effective parenting. Description: Identify unresolved childhood issues that affect parenting and work toward their resolution. Target Date: 2025-05-14 Frequency: Daily Modality: individual Progress: 100%  Related Problem: Achieve a level of competent, effective parenting. Description: Freely express feelings of frustration, helplessness, and inadequacy that each experiences in the parenting role. Target Date: 2025-05-14 Frequency: Daily Modality: individual Progress: 85%  Related Problem: Resolve the core conflict that is the source of anxiety. Description: Learn and implement problem-solving strategies for realistically addressing worries. Target Date: 2025-05-14 Frequency: Daily Modality: individual Progress:  85%  Related Problem: Resolve the core conflict that is the source of anxiety. Description: Learn and implement calming skills to reduce overall anxiety and manage anxiety symptoms. Target Date: 2025-05-14 Frequency: Daily Modality: individual Progress: 75%  Related Problem: Resolve the core conflict that is the source of anxiety. Description: Describe situations, thoughts, feelings, and actions associated with anxieties and worries, their impact on functioning, and attempts to resolve them. Target Date: 2025-05-14 Frequency: Daily Modality: individual Progress: 85%  Client Response full compliance  Service Location Location, [address] [place], [place] [address]  Service Code cpt [phone]  Behavioral activation plan  Facilitate problem solving  Identify automatic thoughts  Emotion regulation skills  Provide education, information  Self care activities  Relaxation training  Lifestyle change (exercise, nutrition)  Self-monitoring  Validate/empathize  Session notes: F 41.1  Goals: She has chronic anxieties, mostly related to health concerns and her son's well-being. Would like to develop strategies to manage symptoms of anxiety. Needs to understand enmeshment and learn to define and manage appropriate family boundaries. Target date is 12-23. Patient has realized significant improvement in recognizing and establishing appropriate boundaries. Anxiety about health and family matters persist and she desires to continue treatment in an effort to manage and reduce these feelings. Revised target date is 12-26.  Meds: Xanax  (.25 mg);   Patient agrees to a video session and is aware of the limitations  of this platform. She is at home and I am in my home office.   [name]: [name] says she got the rebound Covid and is still symptomatic (but tested negative). She talked about her son [name]'s reclusive, anxious existence. Discussed if it would be better for him to live in a  smaller, more manageable town. He is thinking about some important perspectives of changing his life. This is a big relief for  [name] and helps her feel less anxious/worried.                                                                                                                                                      CONI ALM KERNS, PhD 10:40a-11:30a 50 min

## 2024-05-04 ENCOUNTER — Ambulatory Visit: Admitting: Internal Medicine

## 2024-05-04 ENCOUNTER — Telehealth: Payer: Self-pay

## 2024-05-04 ENCOUNTER — Encounter: Payer: Self-pay | Admitting: Internal Medicine

## 2024-05-04 VITALS — BP 106/66 | HR 60 | Temp 97.7°F | Ht 62.0 in | Wt 161.8 lb

## 2024-05-04 DIAGNOSIS — L539 Erythematous condition, unspecified: Secondary | ICD-10-CM | POA: Diagnosis not present

## 2024-05-04 DIAGNOSIS — N6325 Unspecified lump in the left breast, overlapping quadrants: Secondary | ICD-10-CM

## 2024-05-04 DIAGNOSIS — J329 Chronic sinusitis, unspecified: Secondary | ICD-10-CM | POA: Diagnosis not present

## 2024-05-04 DIAGNOSIS — U071 COVID-19: Secondary | ICD-10-CM | POA: Diagnosis not present

## 2024-05-04 DIAGNOSIS — Z7901 Long term (current) use of anticoagulants: Secondary | ICD-10-CM

## 2024-05-04 DIAGNOSIS — M0609 Rheumatoid arthritis without rheumatoid factor, multiple sites: Secondary | ICD-10-CM | POA: Diagnosis not present

## 2024-05-04 MED ORDER — AMOXICILLIN-POT CLAVULANATE 875-125 MG PO TABS: 1.0000 | ORAL_TABLET | Freq: Two times a day (BID) | ORAL | 0 refills | Status: DC

## 2024-05-04 NOTE — Patient Instructions (Addendum)
 Treating for  sinus infection right .  Notify Shannon   Not sure if the area on breast is significant  .    We can order and US  of that area .  For after new year.  Lungs are clear today .   Try wearing a reasonable supportive bra  least irritatingly but not  suppressive. . For now .

## 2024-05-04 NOTE — Telephone Encounter (Signed)
 Pt reports she had an apt with PCP today and was diagnosed with sinusitis and prescribed Augmentin . She wants to make sure this does not interact with warfarin.  Advised Augmentin  does not interact with warfarin but can cause some GI upset. Advised if any diarrhea to stop medication and contact the clinic and the coumadin  clinic if any other medications are prescribed. Pt verbalized understanding.

## 2024-05-04 NOTE — Progress Notes (Signed)
 Chief Complaint  Patient presents with   Breast Problem    Pt c/o redness on both breast. Feel very little nodule on nipples.    Nasal Congestion    Pt updates from post covid. still having congestion. Worse at night. Want to make sure she doesn not have sinus infection. Other sx are better. Retested for covid on Monday and Friday. It was negative. Extreme dryness on skin, mouth. Have a feeling she might have UTI in the next few days.    HPI: [name] 63 y.o. come in for fu on ongoing congestion  assoc with covid 19 infection  Very very mild  headache   ( holding ra) minmal  drainage       used nasal spray clear   afrin hasn't  used.    3 weeks     of covid   very  minimal cough  has more congestion on right than left  Hx of  sinus cyst.  On r  Concern if she could be contagious to her son coming to visit for holidays Breast redness  persists /? If worse  or not ; had dx mammo in June and negative  felt derm related but  no dx  no pain or itching has stopped wearing bra as an intervention but no difference .  feels maybe a area nodule or not left inferior around 6 oclock  Still concern about cause  ? If imaging fu adivsed or breast specialist ?  Is off her ra med for infection ROS: See pertinent positives and negatives per HPI. No sob fever new HA   Past Medical History:  Diagnosis Date   Adjustment disorder with anxiety    B12 deficiency due to diet    is a vegetarian   Chest pain, unspecified    Chronic anticoagulation    Fatty liver    Per patient   GERD (gastroesophageal reflux disease)    Headache(784.0)    migraines and head pain   Hx of varicella    As Child   Iron deficiency anemia, unspecified    Otalgia, unspecified    Other and unspecified hyperlipidemia    Palpitations    Personal history of venous thrombosis and embolism    Recurrent UTI    Rheumatoid arthritis(714.0)    deveshewar   Unspecified vitamin D  deficiency    UTI (lower urinary tract  infection) 11/28/2011   citrobacter       Family History  Problem Relation Age of Onset   Parkinsonism Mother    Dementia Father    Stroke Father    Hyperlipidemia Father    Diabetes Father    Lymphoma Other    Colon cancer Neg Hx    Colon polyps Neg Hx    Esophageal cancer Neg Hx    Stomach cancer Neg Hx    Rectal cancer Neg Hx    Breast cancer Neg Hx     Social History   Socioeconomic History   Marital status: Married    Spouse name: Not on file   Number of children: 1   Years of education: Not on file   Highest education level: Bachelor's degree (e.g., BA, AB, BS)  Occupational History   Occupation: home maker  Tobacco Use   Smoking status: Never    Passive exposure: Past   Smokeless tobacco: Never  Vaping Use   Vaping status: Never Used  Substance and Sexual Activity   Alcohol use: No   Drug use:  Never   Sexual activity: Not on file  Other Topics Concern   Not on file  Social History Narrative   Left handed   Caffeine use: Tea daily, coffee once a week   Regular [place]Married HH  of 3 G1P1   Social Drivers of Health   Tobacco Use: Low Risk (05/04/2024)   Patient History    Smoking Tobacco Use: Never    Smokeless Tobacco Use: Never    Passive Exposure: Past  Financial Resource Strain: Low Risk (04/08/2024)   Overall Financial Resource Strain (CARDIA)    Difficulty of Paying Living Expenses: Not hard at all  Food Insecurity: No Food Insecurity (04/08/2024)   Epic    Worried About Programme Researcher, Broadcasting/film/video in the Last Year: Never true    Ran Out of Food in the Last Year: Never true  Transportation Needs: No Transportation Needs (04/08/2024)   Epic    Lack of Transportation (Medical): No    Lack of Transportation (Non-Medical): No  Physical Activity: Unknown (04/08/2024)   Exercise Vital Sign    Days of Exercise per Week: 2 days    Minutes of Exercise per Session: Not on file  Recent Concern: Physical Activity -  Insufficiently Active (02/17/2024)   Exercise Vital Sign    Days of Exercise per Week: 3 days    Minutes of Exercise per Session: 40 min  Stress: Stress Concern Present (04/08/2024)   [employer] of Occupational Health - Occupational Stress Questionnaire    Feeling of Stress: To some extent  Social Connections: Moderately Isolated (04/08/2024)   Social Connection and Isolation Panel    Frequency of Communication with Friends and Family: Three times a week    Frequency of Social Gatherings with Friends and Family: Once a week    Attends Religious Services: Patient declined    Active Member of Clubs or Organizations: No    Attends Engineer, Structural: Not on file    Marital Status: Married  Depression (PHQ2-9): Low Risk (05/04/2024)   Depression (PHQ2-9)    PHQ-2 Score: 1  Alcohol Screen: Not on file  Housing: Unknown (04/08/2024)   Epic    Unable to Pay for Housing in the Last Year: No    Number of Times Moved in the Last Year: Not on file    Homeless in the Last Year: No  Utilities: Not on file  Health Literacy: Not on file    Outpatient Medications Prior to Visit  Medication Sig Dispense Refill   azelastine  (ASTELIN ) 0.1 % nasal spray Place 2 sprays into both nostrils 2 (two) times daily. Use in each nostril as directed 30 mL 1   AZO D-MANNOSE PO Take by mouth.     Calcium Carb-Cholecalciferol 600-800 MG-UNIT TABS Take by mouth.     Cholecalciferol (VITAMIN D3) 2000 UNITS TABS Take 1 tablet by mouth daily.     Cyanocobalamin  (VITAMIN B-12 PO) Take by mouth once a week.     cycloSPORINE (RESTASIS) 0.05 % ophthalmic emulsion Place 1 drop into both eyes daily.     diclofenac Sodium (VOLTAREN) 1 % GEL Apply topically 4 (four) times daily.     Eflornithine HCl 13.9 % cream Apply topically daily.      folic acid  (FOLVITE ) 1 MG tablet TAKE 2 TABLETS(2 MG) BY MOUTH DAILY 180 tablet 3   hydroxychloroquine  (PLAQUENIL ) 200 MG tablet TAKE 1 TABLET BY MOUTH TWICE DAILY,  MONDAY THROUGH FRIDAY ONLY 120 tablet 0  mineral/vitamin supplement (MULTIGEN) 70 MG TABS tablet TAKE 1 TABLET (70 MG TOTAL) BY MOUTH DAILY. 90 tablet 1   Omeprazole  Magnesium (PRILOSEC PO) Take by mouth 3 (three) times a week.     Polyethylene Glycol 3350 (MIRALAX PO) Take by mouth as needed. (Patient taking differently: Take by mouth as needed. Every other night)     pregabalin  (LYRICA ) 50 MG capsule TAKE 1 CAPSULE(50 MG) BY MOUTH DAILY EVERY MORNING AND EVERY NIGHT AT BEDTIME 180 capsule 1   SUMAtriptan  (IMITREX ) 50 MG tablet TAKE ONE TABLET BY MOUTH AS NEEDED. MAY REPEAT IN 2 TO 4 HOURS IF NEEDED. 9 tablet 1   warfarin (COUMADIN ) 2.5 MG tablet TAKE 2 TABLETS BY MOUTH DAILY, EXCEPT TAKE  1 TABLET ON SUNDAYS AND THURSDAYS AND TAKE 1 1/2 TABLETS ON TUESDAYS OR AS DIRECTED BY ANTICOAGULATION CLINIC 180 tablet 1   ALPRAZolam  (XANAX ) 0.25 MG tablet Take 1 tablet (0.25 mg total) by mouth 2 (two) times daily as needed for anxiety. (Patient not taking: Reported on 05/04/2024) 20 tablet 0   methotrexate  (RHEUMATREX) 2.5 MG tablet Take 8 tablets (20 mg total) by mouth once a week. (Patient not taking: Reported on 05/04/2024) 96 tablet 0   No facility-administered medications prior to visit.     EXAM:  BP 106/66 (BP Location: Left Arm, Patient Position: Sitting, Cuff Size: Normal)   Pulse 60   Temp 97.7 F (36.5 C) (Oral)   Ht 5' 2 (1.575 m)   Wt 161 lb 12.8 oz (73.4 kg)   SpO2 95%   BMI 29.59 kg/m   Body mass index is 29.59 kg/m.  GENERAL: vitals reviewed and listed above, alert, oriented, appears well hydrated and in no acute distress HEENT: atraumatic, conjunctiva  clear, no obvious abnormalities on inspection of external nose and ears  tmx clear  ? If clear fluid r  nares mucoid dc right  OP : no lesion edema or exudate  NECK: no obvious masses on inspection palpation  LUNGS: clear to auscultation bilaterally, no wheezes, rales or rhonchi, good air movement Breast  mottled lacy  erythema bilateral breast not imvolving nipple or areola   superficial and non oalpable  left breast ? Nodule vs thickening around 6 oclock near bra line  mobile  asymetric  CV: HRRR, no clubbing cyanosis or  peripheral edema nl cap refill  MS: moves all extremities without noticeable focal  abnormality PSYCH: pleasant and cooperative,  Lab Results  Component Value Date   WBC 7.5 01/22/2024   HGB 14.2 01/22/2024   HCT 43.5 01/22/2024   PLT 240 01/22/2024   GLUCOSE 93 01/22/2024   CHOL 214 (H) 10/07/2023   TRIG 139.0 10/07/2023   HDL 75.50 10/07/2023   LDLDIRECT 97.3 05/25/2013   LDLCALC 111 (H) 10/07/2023   ALT 12 01/22/2024   AST 19 01/22/2024   NA 141 01/22/2024   K 4.4 01/22/2024   CL 106 01/22/2024   CREATININE 0.80 01/22/2024   BUN 11 01/22/2024   CO2 28 01/22/2024   TSH 3.30 10/07/2023   INR 1.6 (A) 05/02/2024   HGBA1C 6.0 10/07/2023   MICROALBUR <0.7 10/07/2023   BP Readings from Last 3 Encounters:  05/04/24 106/66  04/08/24 110/78  03/30/24 102/62    ASSESSMENT AND PLAN:  Discussed the following assessment and plan:  Sinusitis, unspecified chronicity, unspecified location - right maxillary seconday to resolving covid infection  Breast erythema - bilateral symmetrical wthout scarring  blotchy  spider vessels? no swelling or dc - Plan:  US  LIMITED ULTRASOUND INCLUDING AXILLA LEFT BREAST   Breast lump ? on left side at 6 o'clock position - uncertain if just thickened breast tissue or nodule - Plan: US  LIMITED ULTRASOUND INCLUDING AXILLA LEFT BREAST   Long term (current) use of anticoagulants [Z79.01]  COVID-19 virus infection  Rheumatoid arthritis of multiple sites with negative rheumatoid factor (HCC) Persistent congestion from covid   poss r max sinusitis  so add antibiotic  do not think sig lung infection.  Should not be communicable ( also tested neg)   Still not sure what to make of the erythema bilateral  other specialists felt not significant she has  stopped all bras but think still should use gentle support to avoid chaffing .  Will order breast us  left  for poss nodule ( to be done aftetr holiday in Jan )  then go from there . -Patient advised to return or notify health care team  if  new concerns arise.  Patient Instructions  Treating for  sinus infection right .  Notify Shannon   Not sure if the area on breast is significant  .    We can order and US  of that area .  For after new year.  Lungs are clear today .   Try wearing a reasonable supportive bra  least irritatingly but not  suppressive. . For now .      Nevan Creighton K. Ean Gettel M.D.

## 2024-05-05 ENCOUNTER — Encounter: Payer: Self-pay | Admitting: Internal Medicine

## 2024-05-05 ENCOUNTER — Other Ambulatory Visit: Payer: Self-pay | Admitting: Internal Medicine

## 2024-05-05 ENCOUNTER — Ambulatory Visit: Payer: Self-pay

## 2024-05-05 ENCOUNTER — Telehealth: Payer: Self-pay | Admitting: Internal Medicine

## 2024-05-05 DIAGNOSIS — L539 Erythematous condition, unspecified: Secondary | ICD-10-CM

## 2024-05-05 DIAGNOSIS — N6325 Unspecified lump in the left breast, overlapping quadrants: Secondary | ICD-10-CM

## 2024-05-05 MED ORDER — AMOXICILLIN 500 MG PO CAPS: 500.0000 mg | ORAL_CAPSULE | Freq: Three times a day (TID) | ORAL | 0 refills | Status: AC

## 2024-05-05 NOTE — Telephone Encounter (Signed)
 Pt contacted coumadin  clinic to report abx has been changed from augmentin  to amoxicillin . She is concerned if there is an interaction.  Advised not interaction. Advised if any changes to contact coumadin  clinic. Pt verbalized understanding.

## 2024-05-05 NOTE — Telephone Encounter (Signed)
 I dont think its his antibiotic  but we can change to amoxicillin  500 mg tid for 7 days disp 21  instead .  Make sure not taking any kind of decongestant to cause palpitations  You can call /contact dr Loni s office  also to get advice .

## 2024-05-05 NOTE — Telephone Encounter (Signed)
°  FYI Only or Action Required?: Action required by provider: clinical question for provider, update on patient condition, and request for alternative medication.  Patient was last seen in primary care on 05/04/2024 by Panosh, Apolinar POUR, MD.  Called Nurse Triage reporting Palpitations.  Symptoms began today.  Interventions attempted: Other: Discontinued antibiotic, Augmentin .  Symptoms are: stable.  Triage Disposition: See HCP Within 4 Hours (Or PCP Triage)  Patient/caregiver understands and will follow disposition?: Yes  Message from Tiffini S sent at 05/05/2024  8:44 AM EST  Reason for Triage: prescribed antibiotic for sinus infection- had racing heart/ palpitations this morning. States that she is okay now but would like to change the prescription to a different antibiotic/ unsure if related to medicine  Please call the patient back to discuss at [phone]   Reason for Disposition  Age > 60 years  (Exception: Brief heartbeat symptoms that went away and now feels well.)  Answer Assessment - Initial Assessment Questions 1. DESCRIPTION: Please describe your heart rate or heartbeat that you are having (e.g., fast/slow, regular/irregular, skipped or extra beats, palpitations)      Heart racing  2. ONSET: When did it start? (e.g., minutes, hours, days)       This morning  3. DURATION: How long does it last (e.g., seconds, minutes, hours)      A few minutes. Happened only once  4. PATTERN Does it come and go, or has it been constant since it started?  Does it get worse with exertion?   Are you feeling it now?      Racing heart has resolved now         5. HEART RATE: Can you tell me your heart rate? How many beats in 15 seconds?  Note: Not all patients can do this.        86  6. RECURRENT SYMPTOM: Have you ever had this before? If Yes, ask: When was the last time? and What happened that time?  Symptoms are recurrent       7. CAUSE: What do you think  is causing the palpitations?      Believes cause is due to new antibiotic, Augmentin . Pt has only taken one dose so far  8. CARDIAC HISTORY: Do you have any history of heart disease? (e.g., heart attack, angina, bypass surgery, angioplasty, arrhythmia)  Pt does not have cardiac hx       10. OTHER SYMPTOMS:  Pt denies dizziness, chest pain, sweating, difficulty breathing         Pt reports palpitations/tachycardia Pt is taking Augmentin . Pt requesting an alternative medication due to c/o of Augmentin  possibly being cause of symptoms. Pt has an echocardiogram scheduled tomorrow on, 12.19.25. Routing to clinic to assist with patient's concerns. Pt also wants to know if she needs an adjustment on her Coumadin  dose. Pt states she prefers not to continue taking Augmentin  unless care team instructs her to. However, pt prefers an alternative medication. Pt agrees with plan of care, will call back for any worsening symptoms  Protocols used: Heart Rate and Heartbeat Questions-A-AH

## 2024-05-05 NOTE — Telephone Encounter (Signed)
 Message sent to pt's mychart message.

## 2024-05-06 ENCOUNTER — Ambulatory Visit (HOSPITAL_COMMUNITY)
Admission: RE | Admit: 2024-05-06 | Discharge: 2024-05-06 | Disposition: A | Discharge status: Home / Self Care | Payer: Self-pay | Source: Ambulatory Visit | Attending: Cardiovascular Disease | Admitting: Cardiovascular Disease

## 2024-05-06 ENCOUNTER — Ambulatory Visit (HOSPITAL_COMMUNITY)
Admission: RE | Admit: 2024-05-06 | Discharge: 2024-05-06 | Disposition: A | Discharge status: Home / Self Care | Payer: Self-pay | Source: Ambulatory Visit | Attending: Internal Medicine | Admitting: Internal Medicine

## 2024-05-06 DIAGNOSIS — R0602 Shortness of breath: Secondary | ICD-10-CM | POA: Diagnosis not present

## 2024-05-06 DIAGNOSIS — Z86718 Personal history of other venous thrombosis and embolism: Secondary | ICD-10-CM | POA: Insufficient documentation

## 2024-05-06 DIAGNOSIS — R600 Localized edema: Secondary | ICD-10-CM | POA: Diagnosis not present

## 2024-05-06 DIAGNOSIS — Z7189 Other specified counseling: Secondary | ICD-10-CM

## 2024-05-06 DIAGNOSIS — M7989 Other specified soft tissue disorders: Secondary | ICD-10-CM | POA: Diagnosis present

## 2024-05-06 DIAGNOSIS — R079 Chest pain, unspecified: Secondary | ICD-10-CM | POA: Diagnosis not present

## 2024-05-08 ENCOUNTER — Ambulatory Visit: Payer: Self-pay | Admitting: Internal Medicine

## 2024-05-08 LAB — ECHOCARDIOGRAM COMPLETE
AR max vel: 2.13 cm2
AV Area VTI: 2.31 cm2
AV Area mean vel: 2.26 cm2
AV Mean grad: 3 mmHg
AV Peak grad: 6.5 mmHg
Ao pk vel: 1.27 m/s
Area-P 1/2: 3.88 cm2
S' Lateral: 2.05 cm

## 2024-05-10 ENCOUNTER — Ambulatory Visit: Attending: Internal Medicine | Admitting: Internal Medicine

## 2024-05-10 ENCOUNTER — Ambulatory Visit

## 2024-05-10 VITALS — BP 108/62 | HR 59 | Ht 62.0 in | Wt 161.0 lb

## 2024-05-10 DIAGNOSIS — R002 Palpitations: Secondary | ICD-10-CM | POA: Diagnosis not present

## 2024-05-10 DIAGNOSIS — Z7189 Other specified counseling: Secondary | ICD-10-CM | POA: Diagnosis not present

## 2024-05-10 DIAGNOSIS — R42 Dizziness and giddiness: Secondary | ICD-10-CM | POA: Diagnosis not present

## 2024-05-10 DIAGNOSIS — Z86718 Personal history of other venous thrombosis and embolism: Secondary | ICD-10-CM

## 2024-05-10 DIAGNOSIS — M7989 Other specified soft tissue disorders: Secondary | ICD-10-CM | POA: Diagnosis not present

## 2024-05-10 NOTE — Progress Notes (Unsigned)
 Enrolled patient for a 14 day Zio XT  monitor to be mailed to patients home

## 2024-05-10 NOTE — Patient Instructions (Signed)
 Medication Instructions:  No Changes *If you need a refill on your cardiac medications before your next appointment, please call your pharmacy*  Lab Work: None  Testing/Procedures:  GEOFFRY HEWS- Long Term Monitor Instructions  Your physician has requested you wear a ZIO patch monitor for 14 days.  This is a single patch monitor. [employer] supplies one patch monitor per enrollment. Additional stickers are not available. Please do not apply patch if you will be having a Nuclear Stress Test,  Echocardiogram, Cardiac CT, MRI, or Chest Xray during the period you would be wearing the  monitor. The patch cannot be worn during these tests. You cannot remove and re-apply the  ZIO XT patch monitor.  Your ZIO patch monitor will be mailed 3 day USPS to your address on file. It may take 3-5 days  to receive your monitor after you have been enrolled.  Once you have received your monitor, please review the enclosed instructions. Your monitor  has already been registered assigning a specific monitor serial # to you.  Billing and Patient Assistance Program Information  We have supplied [employer] with any of your insurance information on file for billing purposes. [employer] offers a sliding scale Patient Assistance Program for patients that do not have  insurance, or whose insurance does not completely cover the cost of the ZIO monitor.  You must apply for the Patient Assistance Program to qualify for this discounted rate.  To apply, please call [employer] at [phone], select option 4, select option 2, ask to apply for  Patient Assistance Program. Meredeth will ask your household income, and how many people  are in your household. They will quote your out-of-pocket cost based on that information.  [employer] will also be able to set up a 8-month, interest-free payment plan if needed.  Applying the monitor   Shave hair from upper left chest.  Hold abrader disc by orange tab. Rub abrader in 40 strokes over the  upper left chest as  indicated in your monitor instructions.  Clean area with 4 enclosed alcohol pads. Let dry.  Apply patch as indicated in monitor instructions. Patch will be placed under collarbone on left  side of chest with arrow pointing upward.  Rub patch adhesive wings for 2 minutes. Remove white label marked 1. Remove the white  label marked 2. Rub patch adhesive wings for 2 additional minutes.  While looking in a mirror, press and release button in center of patch. A small green light will  flash 3-4 times. This will be your only indicator that the monitor has been turned on.  Do not shower for the first 24 hours. You may shower after the first 24 hours.  Press the button if you feel a symptom. You will hear a small click. Record Date, Time and  Symptom in the Patient Logbook.  When you are ready to remove the patch, follow instructions on the last 2 pages of Patient  Logbook. Stick patch monitor onto the last page of Patient Logbook.  Place Patient Logbook in the blue and white box. Use locking tab on box and tape box closed  securely. The blue and white box has prepaid postage on it. Please place it in the mailbox as  soon as possible. Your physician should have your test results approximately 7 days after the  monitor has been mailed back to [facility].  Call [facility] Customer Care at [phone] if you have questions regarding  your ZIO XT patch monitor. Call them immediately if you  see an orange light blinking on your  monitor.  If your monitor falls off in less than 4 days, contact our Monitor department at [phone].  If your monitor becomes loose or falls off after 4 days call [employer] at [phone] for  suggestions on securing your monitor   Follow-Up: At [facility], you and your health needs are our priority.  As part of our continuing mission to provide you with exceptional heart care, our providers are all part of one team.  This team  includes your primary Cardiologist (physician) and Advanced Practice Providers or APPs (Physician Assistants and Nurse Practitioners) who all work together to provide you with the care you need, when you need it.  Your next appointment:   1 year(s) (Call in Oct 2026 for a December 2026 appointment)  Provider:   Gayatri A Acharya, MD  or Callie Goodrich, PA or Hao Meng, [place], or Damien Braver, NP  Other Instructions Please call us  or send a MyChart message with any Cardiology related questions/concerns.  [phone].  Thank you!

## 2024-05-10 NOTE — Telephone Encounter (Signed)
 I dont think the antibiotic  cause the palpitations  but I guess the illness could?  I see that cards is doing evaluation  Let us  know if the sinus infection if getting better  either way

## 2024-05-10 NOTE — Progress Notes (Signed)
 " Cardiology Office Note:  .   Date:  05/10/2024  ID:  [name], DOB 1960-12-22, MRN [id number] PCP: Charlett Apolinar POUR, MD  [facility] Providers Cardiologist:  Soyla DELENA Merck, MD    History of Present Illness: .   [name] is a 63 y.o. female.  Discussed the use of AI scribe software for clinical note transcription with the patient, who gave verbal consent to proceed.  History of Present Illness [name] is a 63 year old female with rheumatoid arthritis and a history of pulmonary embolism who presents with palpitations and dizziness.  She has morning palpitations while still in bed that ease during the day. Symptoms have worsened over the past week but have occurred previously. She has no syncope or other alarming associated symptoms.  She has rheumatoid arthritis and has held her medication since a recent illness, previously treated with methotrexate  and folic acid .  She had a pulmonary embolism in 2006 and has been on chronic warfarin since then.  She has occasional brief dizziness, usually before getting up, which is infrequent and unchanged.  She recently had a mild COVID-19 infection and is on antibiotics for a secondary sinus infection. She is immunosuppressed.    ROS: negative except per HPI above.  Studies Reviewed: .        Results Radiology Coronary artery calcium score: Calcium score zero, no coronary artery calcification  Diagnostic Echocardiogram: Normal cardiac structure and function Risk Assessment/Calculations:       Physical Exam:   VS:  BP 108/62 (BP Location: Right Arm, Patient Position: Sitting, Cuff Size: Normal)   Pulse (!) 59   Ht 5' 2 (1.575 m)   Wt 161 lb (73 kg)   SpO2 96%   BMI 29.45 kg/m    Wt Readings from Last 3 Encounters:  05/10/24 161 lb (73 kg)  05/04/24 161 lb 12.8 oz (73.4 kg)  04/20/24 162 lb (73.5 kg)     Physical Exam GENERAL: Alert, cooperative, well developed, no acute  distress HEENT: Normocephalic, normal oropharynx, moist mucous membranes CHEST: Clear to auscultation bilaterally, no wheezes, rhonchi, or crackles CARDIOVASCULAR: Normal heart rate and rhythm, S1 and S2 normal without murmurs ABDOMEN: Soft, non-tender, non-distended, without organomegaly, normal bowel sounds EXTREMITIES: No cyanosis or edema NEUROLOGICAL: Cranial nerves grossly intact, moves all extremities without gross motor or sensory deficit   ASSESSMENT AND PLAN: .    Assessment and Plan Assessment & Plan Palpitations and dizziness Intermittent palpitations upon waking, resolving throughout the day. No syncope. Dizziness infrequent, occurs before getting up. Echocardiogram and calcium score normal, low coronary artery disease risk. - Ordered heart monitor for two weeks to evaluate for arrhythmias. - Advised wearing compression socks to address dizziness. - Instructed to report any [name] symptoms such as chest pain or leg cramping.  History of pulmonary embolism on chronic anticoagulation Pulmonary embolism in 2006, on warfarin. Cold feet may relate to anticoagulation. - Continue warfarin therapy.  Rheumatoid arthritis Managed with methotrexate  and folic acid . No symptoms of coronary artery disease. Calcium score zero, low coronary artery disease risk. Rheumatoid arthritis may predispose to earlier coronary artery disease, but current evaluation is low risk. - Continue methotrexate  and folic acid  therapy. - Monitor for symptoms of coronary artery disease, such as chest pain or pressure.  Cardiac risk assessment and evaluation of lower extremity swelling Intermittent dizziness and orthostatic lightheadedness possibly due to blood pressure changes. Lower extremity swelling likely from venous insufficiency. No significant cardiac abnormalities on previous echocardiogram. Anxiety  may contribute to symptoms. - cor cal zero -  echo normal - Advised wearing medium strength compression  socks.   Venous insufficiency of lower extremities Swelling likely due to venous insufficiency, exacerbated by weight gain and prolonged standing. - Advised wearing medium strength compression socks.      Soyla Merck, MD, [facility] "

## 2024-05-16 ENCOUNTER — Ambulatory Visit: Admitting: Psychology

## 2024-05-16 DIAGNOSIS — F411 Generalized anxiety disorder: Secondary | ICD-10-CM

## 2024-05-16 NOTE — Progress Notes (Signed)
 "                                                                                                                                                           05/16/2024  Treatrment Plan:  Diagnosis F41.1 (Generalized anxiety disorder) [n/a]  Z62.820 (Parent child relationship problem) [n/a]  Symptoms Excessive and/or unrealistic worry that is difficult to control occurring more days than not for at least 6 months about a number of events or activities. (Status: maintained) -- No Description Entered  Regularly overindulges their child's wishes and demands. (Status: maintained) -- No Description Entered  Medication Status compliance  Safety none  If Suicidal or Homicidal State Action Taken: unspecified  Current Risk: low Medications unspecified Objectives Related Problem: Achieve a level of competent, effective parenting. Description: Increase the gradual letting go of their adolescent in constructive, affirmative ways. Target Date: 2025-05-14 Frequency: Daily Modality: individual Progress: 95%  Related Problem: Achieve a level of competent, effective parenting. Description: Identify unresolved childhood issues that affect parenting and work toward their resolution. Target Date: 2025-05-14 Frequency: Daily Modality: individual Progress: 100%  Related Problem: Achieve a level of competent, effective parenting. Description: Freely express feelings of frustration, helplessness, and inadequacy that each experiences in the parenting role. Target Date: 2025-05-14 Frequency: Daily Modality: individual Progress: 85%  Related Problem: Resolve the core conflict that is the source of anxiety. Description: Learn and implement problem-solving strategies for realistically addressing worries. Target Date: 2025-05-14 Frequency: Daily Modality:  individual Progress: 85%  Related Problem: Resolve the core conflict that is the source of anxiety. Description: Learn and implement calming skills to reduce overall anxiety and manage anxiety symptoms. Target Date: 2025-05-14 Frequency: Daily Modality: individual Progress: 75%  Related Problem: Resolve the core conflict that is the source of anxiety. Description: Describe situations, thoughts, feelings, and actions associated with anxieties and worries, their impact on functioning, and attempts to resolve them. Target Date: 2025-05-14 Frequency: Daily Modality: individual Progress: 85%  Client Response full compliance  Service Location Location, [address] [place], [place] [address]  Service Code cpt [phone]  Behavioral activation plan  Facilitate problem solving  Identify automatic thoughts  Emotion regulation skills  Provide education, information  Self care activities  Relaxation training  Lifestyle change (exercise, nutrition)  Self-monitoring  Validate/empathize  Session notes: F 41.1  Goals: She has chronic anxieties, mostly related to health concerns and her son's well-being. Would like to develop strategies to manage symptoms of anxiety. Needs to understand enmeshment and learn to define and manage appropriate family boundaries. Target date is 12-23. Patient has realized significant improvement in recognizing and establishing appropriate boundaries. Anxiety about health and family matters persist and she desires to continue treatment in an effort to manage and reduce these feelings. Revised target date is 12-26.  Meds: Xanax  (.  25 mg);   Patient agrees to a video session and is aware of the limitations of this platform. She is at home and I am in my home office.   Roneshia: Banafshef says that [name] has been in town and has had a number of doctor's appointments. He does not do very much other than sleep and mild exercise. No socializing. Watches movies and has  avoided working (which is more positive). Dametra expresses her comfort having him home but extreme concern for his ongoing medical issues and apparent depression. She needs to step away from trying to control son's life. Told her ways that he can, on his own, begin to explore more ways to fill his life.                                                                                                                                                        CONI ALM KERNS, PhD 10:40a-11:30a 50 min         "

## 2024-05-23 ENCOUNTER — Ambulatory Visit

## 2024-05-24 ENCOUNTER — Encounter: Payer: Self-pay | Admitting: Rheumatology

## 2024-05-25 ENCOUNTER — Ambulatory Visit: Payer: Self-pay

## 2024-05-25 NOTE — Telephone Encounter (Signed)
 All the sports medicine doctors at [facility] health are excellent.  I would recommend seeing any of them whoever can see her first.

## 2024-05-25 NOTE — Telephone Encounter (Signed)
 Spoke to pt. Pt reports her main concern is vaginal burning from using wrong cream. She states it happens this morning 1am. She washed it off several time with soap and use ice. Pt denied sx of itchy, rash, hives. Pt states the burning is a little lessen from this morning. Pt concerns because she is on blood thinner. Pt would like to know Dr. Charlett can prescribe something to soothe her sx or OTC she can use. Pt reports she is little dizzy, dry mouth reappear last night. Pt is wondering if it is from the cream or from her stress.   Inform pt, I don't think the cream is the cause for that dry mouth or dizzyness but will forward to Dr. Charlett to advise. Inform pt we don't have available this week. Pt states if there is cancellation to let her know.   As for her pain from the fall, pt states she has not taken or pick up Rx low dose prednisone  that was sent by the [facility] provider on 1/2 due she just had tdap vaccine on 05/19/2024. She is concerns about the interaction. But has been taking tylenol  as needed for the pain and using ice and heat.   Please review message above and below message for detail info and advise.

## 2024-05-25 NOTE — Telephone Encounter (Signed)
 " FYI Only or Action Required?: Action required by provider: update on patient condition.  Patient was last seen in primary care on 05/04/2024 by Panosh, Apolinar POUR, MD.  Called Nurse Triage reporting No chief complaint on file..  Symptoms began yesterday.  Interventions attempted: Rest, hydration, or home remedies.  Symptoms are: gradually worsening.  Triage Disposition: See PCP Within 2 Weeks  Patient/caregiver understands and will follow disposition?: No, wishes to speak with PCP  Copied from CRM #[id number]. Topic: Clinical - Red Word Triage >> May 25, 2024  8:05 AM Robinson DEL wrote: Red Word that prompted transfer to Nurse Triage: Last night accidently used wrong cream in vaginal area having some burning in vaginal area, fell a couple days ago in the garage went to urgent care but still having pain on left side, pain in hip on right side and multiple places, bruises all over Reason for Disposition  [1] Fall AND [2] went to emergency department for evaluation or treatment  Answer Assessment - Initial Assessment Questions 12/31-Fell in garage leaning onto an empty box Fell onto her left elbow, shoulder and hip. [facility]- xray of shoulder and hip-- no fracture- if doesn't get better to go see sports meds Hip hurting more  Bruising on hip- resolved mostly  Tetnus shot and NP and low dose prednisone  but has not picked up prednisone   Covid before thanksgiving-  Off her methotrexate -due to covid and tetnus shot-   Patient would like to be seen today- advises if we reach out to Dr Lynne CMA they will get her worked in. Please advise on if patient can be worked in today. Only wants to see Dr Charlett   1. MECHANISM: How did the fall happen?     Went in the garage and leaned on a empty box and fell. Landed on left elbow and left hip  2. DOMESTIC VIOLENCE AND ELDER ABUSE SCREENING: Did you fall because someone pushed you or tried to hurt you? If Yes, ask: Are you safe now?      denies 3. ONSET: When did the fall happen? (e.g., minutes, hours, or days ago)     12/31 4. LOCATION: What part of the body hit the ground? (e.g., back, buttocks, head, hips, knees, hands, head, stomach)     Left shoulder/hip  5. INJURY: Did you hurt (injure) yourself when you fell? If Yes, ask: What did you injure? Tell me more about this? (e.g., body area; type of injury; pain severity)     Scratched elbow  6. PAIN: Is there any pain? If Yes, ask: How bad is the pain? (e.g., Scale 0-10; or none, mild,     4/10 pain in her hip last night and 2/10 prior 7. SIZE: For cuts, bruises, or swelling, ask: How large is it? (e.g., inches or centimeters)      Scratch to her elbow- resolving.  9. OTHER SYMPTOMS: Do you have any other symptoms? (e.g., dizziness, fever, weakness; new-onset or worsening).      Denies  10. CAUSE: What do you think caused the fall (or falling)? (e.g., dizzy spell, tripped)       Leaned on an empty box  Answer Assessment - Initial Assessment Questions Every night uses vaginal OTC cream for dryness- accidentally grabbed her Voltaren arthritis cream and applied to vaginal area- started burning - wiped and washed self- shower, talked to after hours number who suggested ice to the area. Has increased water intake and washed multiple times since. Denies redness, rash, fever,  abnormal discharge, or odor. Just burning sensation. Has lessened since last night but is very concerned.    1. SYMPTOM: What's the main symptom you're concerned about? (e.g., pain, itching, dryness)     Vaginal burning  2. LOCATION: Where is the  burning located? (e.g., inside/outside, left/right)    Vaginal area- outside 3. ONSET: When did the  burning start?     Last night  4. PAIN: Is there any pain? If Yes, ask: How bad is it? (Scale: 1-10; mild, moderate, severe)     Burning- little now but was moderate-severe previously  5. ITCHING: Is there any itching? If Yes, ask:  How bad is it? (Scale: 1-10; mild, moderate, severe)     denies 6. CAUSE: What do you think is causing the discharge? Have you had the same problem before? What happened then?     Mix up of cream  7. OTHER SYMPTOMS: Do you have any other symptoms? (e.g., fever, itching, vaginal bleeding, pain with urination, injury to genital area, vaginal foreign body)     Denies  Protocols used: Vaginal Symptoms-A-AH, Falls and Falling-A-AH  "

## 2024-05-25 NOTE — Telephone Encounter (Signed)
 Follow up with pt and went over with provider's advise. Pt ask to send it her mychart as well.   Pt follow up about her fall taking prednisone . Inform pt Dr. Charlett didn't address it but did ask her verbally and was advise okay to take. Pt had tdap vaccine on 05/19/2024.  Pt states she will pick it up tomorrow and take it.   Pt would like to ask if Dr. Charlett can recommend a sport medicine provider for her L hip. She said she had search it up and so many providers pop up and would like Dr. Charlett recommendation. She said she has no establish sport medicine.   Please advise.

## 2024-05-25 NOTE — Telephone Encounter (Signed)
 Sorry this happened  suspect  a topical reaction  and less Is best ( do not think would cause dry mouth but could get  if anxious reaction)     Maybe gentle  cool ( not cold_) compresses   .. consider  post partum sitz bath soaks  .( I dont have a brand  and not sure which would be best )   May take many days to be better .

## 2024-05-30 ENCOUNTER — Ambulatory Visit: Admitting: Psychology

## 2024-05-30 ENCOUNTER — Encounter: Payer: Self-pay | Admitting: Family Medicine

## 2024-05-30 ENCOUNTER — Ambulatory Visit: Admitting: Family Medicine

## 2024-05-30 VITALS — BP 123/64 | Ht 62.5 in | Wt 158.0 lb

## 2024-05-30 DIAGNOSIS — M25552 Pain in left hip: Secondary | ICD-10-CM | POA: Diagnosis not present

## 2024-05-30 NOTE — Progress Notes (Signed)
 PCP: Charlett Apolinar POUR, MD  Discussed the use of AI scribe software for clinical note transcription with the patient, who gave verbal consent to proceed.  History of Present Illness [name] is a 64 year old female with rheumatoid arthritis and osteoporosis who presents with worsening left hip pain following a recent fall.  Left Hip Pain and Injury: - Fell onto left side in garage 12 days ago - Immediate ability to ambulate after the fall - Initial pain involved left elbow, shoulder, and groin/lateral hip - Left hip pain has progressively worsened and now radiates into the anterior thigh - Pain is provoked by movement and weight-bearing - Significant difficulty with stairs and bed transfers - Sitting is comfortable - Groin pain has improved but persists - Large ecchymosis developed over the left hip several days after the fall, now mostly resolved - Has not driven since the fall due to pain and safety concerns - Left hip radiographs obtained at orthopedic urgent care 2 days after the fall  Pain Management and Functional Impact: - Acetaminophen  provides partial relief - Short prednisone  taper started 5 days ago improved rheumatoid arthritis symptoms but not hip pain - Continued gentle floor-based yoga for back pain after the fall; concerned this may have worsened hip pain - Significant anxiety about recurrent falls and impaired mobility  Musculoskeletal History: - Chronic L4-L5 radiculopathy managed with pregabalin ; current hip pain is distinct from radiculopathy - Recent restart of methotrexate  after 7-week interruption for COVID-19 - Prior left ankle tendinopathy, suspected to have contributed to the fall  Associated Symptoms and Review of Systems: - No significant back pain - No fever, chills, or other systemic symptoms  Past Medical History:  Diagnosis Date   Adjustment disorder with anxiety    B12 deficiency due to diet    is a vegetarian   Chest pain, unspecified     Chronic anticoagulation    Fatty liver    Per patient   GERD (gastroesophageal reflux disease)    Headache(784.0)    migraines and head pain   Hx of varicella    As Child   Iron deficiency anemia, unspecified    Otalgia, unspecified    Other and unspecified hyperlipidemia    Palpitations    Personal history of venous thrombosis and embolism    Recurrent UTI    Rheumatoid arthritis(714.0)    deveshewar   Unspecified vitamin D  deficiency    UTI (lower urinary tract infection) 11/28/2011   citrobacter       Medications Ordered Prior to Encounter[1]  Past Surgical History:  Procedure Laterality Date   ORIF ULNAR FRACTURE     TYMPANOSTOMY TUBE PLACEMENT  2009   Right    Allergies[2]  BP 123/64   Ht 5' 2.5 (1.588 m)   Wt 158 lb (71.7 kg)   BMI 28.44 kg/m       No data to display              No data to display              Objective:  Physical Exam:  Gen: NAD, comfortable in exam room  Back: No gross deformity, scoliosis. No paraspinal TTP.  No midline or bony TTP. FROM. Strength LEs 5/5 all muscle groups.   Negative SLRs. Sensation intact to light touch bilaterally.  Left hip: No deformity. Full range of motion with 5/5 strength except 3/5 hip abduction. Tenderness to palpation mildly gluteus medius.  No trochanter, other tenderness. Neurovascularly intact distally. Mild pain  with logroll Negative faber, fadir, and piriformis stretches. Assessment & Plan Left hip strain versus occult fracture Persistent left hip pain post-fall suggests gluteus medius strain or partial tear, and possible occult fracture. Independently reviewed outside radiographs and no evidence fracture on those.  Symptoms' chronicity and progression despite conservative treatment necessitate advanced imaging. - Ordered left hip MRI to assess for occult fracture and gluteus medius injury. - Provided guidance on transportation and alternative imaging centers for MRI  scheduling. - Tylenol , voltaren gel, lyrica , icing.  Finish prednisone .  Total visit time 45 minutes including documentation    [1]  Current Outpatient Medications on File Prior to Visit  Medication Sig Dispense Refill   ALPRAZolam  (XANAX ) 0.25 MG tablet Take 1 tablet (0.25 mg total) by mouth 2 (two) times daily as needed for anxiety. 20 tablet 0   azelastine  (ASTELIN ) 0.1 % nasal spray Place 2 sprays into both nostrils 2 (two) times daily. Use in each nostril as directed (Patient not taking: Reported on 05/10/2024) 30 mL 1   AZO D-MANNOSE PO Take by mouth.     Calcium Carb-Cholecalciferol 600-800 MG-UNIT TABS Take by mouth.     Cholecalciferol (VITAMIN D3) 2000 UNITS TABS Take 1 tablet by mouth daily.     Cyanocobalamin  (VITAMIN B-12 PO) Take by mouth once a week.     cycloSPORINE (RESTASIS) 0.05 % ophthalmic emulsion Place 1 drop into both eyes daily.     diclofenac Sodium (VOLTAREN) 1 % GEL Apply topically 4 (four) times daily.     Eflornithine HCl 13.9 % cream Apply topically daily.      folic acid  (FOLVITE ) 1 MG tablet TAKE 2 TABLETS(2 MG) BY MOUTH DAILY 180 tablet 3   hydroxychloroquine  (PLAQUENIL ) 200 MG tablet TAKE 1 TABLET BY MOUTH TWICE DAILY, MONDAY THROUGH FRIDAY ONLY 120 tablet 0   methotrexate  (RHEUMATREX) 2.5 MG tablet Take 8 tablets (20 mg total) by mouth once a week. 96 tablet 0   mineral/vitamin supplement (MULTIGEN) 70 MG TABS tablet TAKE 1 TABLET (70 MG TOTAL) BY MOUTH DAILY. 90 tablet 1   Omeprazole  Magnesium (PRILOSEC PO) Take by mouth 3 (three) times a week.     Polyethylene Glycol 3350 (MIRALAX PO) Take by mouth as needed. (Patient taking differently: Take by mouth as needed. Every other night)     pregabalin  (LYRICA ) 50 MG capsule TAKE 1 CAPSULE(50 MG) BY MOUTH DAILY EVERY MORNING AND EVERY NIGHT AT BEDTIME 180 capsule 1   SUMAtriptan  (IMITREX ) 50 MG tablet TAKE ONE TABLET BY MOUTH AS NEEDED. MAY REPEAT IN 2 TO 4 HOURS IF NEEDED. 9 tablet 1   warfarin (COUMADIN ) 2.5  MG tablet TAKE 2 TABLETS BY MOUTH DAILY, EXCEPT TAKE  1 TABLET ON SUNDAYS AND THURSDAYS AND TAKE 1 1/2 TABLETS ON TUESDAYS OR AS DIRECTED BY ANTICOAGULATION CLINIC 180 tablet 1   No current facility-administered medications on file prior to visit.  [2]  Allergies Allergen Reactions   Boniva [Ibandronate Sodium]     Heartburn   Gadolinium Derivatives     Other Reaction(s): Not available, Not available   Ivp Dye [Iodinated Contrast Media] Swelling    Mouth swelling.   Other Swelling    Mouth swelling. Tongue    Risedronate Sodium Other (See Comments)    Foot and bone pain and fatigue Foot and bone pain and fatigue   Risedronate Sodium [Risedronate Sodium] Other (See Comments)    Foot and bone pain and fatigue   Macrobid  [Nitrofurantoin ] Rash

## 2024-05-30 NOTE — Patient Instructions (Addendum)
 We will go ahead with an MRI of your hip. Follow up with me (can be virtual visit) when the results come back to go over results and next steps. Cane or walker to help take pressure off this leg in the meantime. Icing 15 minutes at a time 3-4 times a day. Finish out your prednisone . It's good you restarted the methotrexate . Continue lyrica . Tylenol  500mg  1-2 tabs three times a day as needed. Voltaren gel up to 4 times a day topically as needed.

## 2024-05-31 ENCOUNTER — Other Ambulatory Visit

## 2024-06-01 ENCOUNTER — Ambulatory Visit

## 2024-06-01 ENCOUNTER — Ambulatory Visit
Admission: RE | Admit: 2024-06-01 | Discharge: 2024-06-01 | Disposition: A | Discharge status: Home / Self Care | Source: Ambulatory Visit | Attending: Internal Medicine | Admitting: Internal Medicine

## 2024-06-01 DIAGNOSIS — L539 Erythematous condition, unspecified: Secondary | ICD-10-CM

## 2024-06-01 DIAGNOSIS — N6325 Unspecified lump in the left breast, overlapping quadrants: Secondary | ICD-10-CM

## 2024-06-02 ENCOUNTER — Encounter: Payer: Self-pay | Admitting: Internal Medicine

## 2024-06-02 ENCOUNTER — Ambulatory Visit
Admission: RE | Admit: 2024-06-02 | Discharge: 2024-06-02 | Disposition: A | Source: Ambulatory Visit | Attending: Family Medicine | Admitting: Family Medicine

## 2024-06-02 ENCOUNTER — Other Ambulatory Visit

## 2024-06-02 ENCOUNTER — Ambulatory Visit: Admitting: Internal Medicine

## 2024-06-02 DIAGNOSIS — M25552 Pain in left hip: Secondary | ICD-10-CM

## 2024-06-03 ENCOUNTER — Telehealth: Payer: Self-pay | Admitting: *Deleted

## 2024-06-03 ENCOUNTER — Other Ambulatory Visit: Payer: Self-pay

## 2024-06-03 ENCOUNTER — Ambulatory Visit: Payer: Self-pay | Admitting: Family Medicine

## 2024-06-03 NOTE — Telephone Encounter (Signed)
 Patient contacted the office and left message stating she had a fall about 2 weeks ago. Patient states she had an MRI which has resulted. Patient would like to have Dr. Jammie advice. She states that if this is her area then she would like you to take over treatment and if not then if you will recommend the best person for treatment. Patient states she trust your input. Patient is requesting a call back.

## 2024-06-03 NOTE — Telephone Encounter (Signed)
 I returned patient's call.  Advised her to continue care with Dr. Cleatrice for the pelvic fracture.

## 2024-06-06 ENCOUNTER — Ambulatory Visit

## 2024-06-06 ENCOUNTER — Ambulatory Visit: Payer: Self-pay

## 2024-06-06 ENCOUNTER — Ambulatory Visit: Admitting: Family Medicine

## 2024-06-06 DIAGNOSIS — Z7901 Long term (current) use of anticoagulants: Secondary | ICD-10-CM

## 2024-06-06 LAB — POCT INR: INR: 2.5 (ref 2.0–3.0)

## 2024-06-06 NOTE — Progress Notes (Signed)
 Indication: PE 2006  Pt reports she had a fall on 1/1 onto concrete floor.which caused 3 small fractures in the hip. Pt reports she has been using Tylenol  for pain but is still in a lot of pain. She is being seen by sports medicine. She has a f/u apt with PCP tomorrow. Pt reports she was prescribed prednisone  for the inflammation and she finished that 4 days ago. She was a prednisone  taper she took for 10 days which totaled 100 mg. Advised this can interact with warfarin and INR should be checked if taking prednisone . Pt reported it has not interacted in the past so she thought it would not this time either.  Pt has been very stable on current warfarin dose. Will only hold dose today and not make any change in weekly dosing due to this. Will make a change if INR is supratherapeutic next week. Pt denies any s/s of bleeding or abnormal bruising. Advised if any s/s to go to ER. Pt verbalized understanding. Hold warfarin today and then continue 5 mg (2 tablets) daily except take 2.5 mg (1 tablet) on Sundays and Thursdays and take 3.75 mg (1 1/2 tablets) on Tuesdays. Recheck in 1 weeks.

## 2024-06-06 NOTE — Patient Instructions (Addendum)
 Pre visit review using our clinic review tool, if applicable. No additional management support is needed unless otherwise documented below in the visit note.  Hold warfarin today and then continue 5 mg (2 tablets) daily except take 2.5 mg (1 tablet) on Sundays and Thursdays and take 3.75 mg (1 1/2 tablets) on Tuesdays. Recheck in 1 weeks.

## 2024-06-06 NOTE — Telephone Encounter (Signed)
 Reason for Disposition  [1] After 3 days AND [2] pain not improved  Answer Assessment - Initial Assessment Questions 1. MECHANISM: How did the injury happen? (e.g., twisting injury, direct blow)      Fall 2. ONSET: When did the injury happen? (e.g., minutes, hours ago)      A week ago 3. LOCATION: Where is the injury located?      hip 4. APPEARANCE of INJURY: What does the injury look like?  (e.g., deformity of leg)      5. SEVERITY: Can you put weight on that leg? Can you walk?      Walking ok 6. SIZE: For cuts, bruises, or swelling, ask: How large is it? (e.g., inches or centimeters;  entire joint)       7. PAIN: Is there pain? If Yes, ask: How bad is the pain?   What does it keep you from doing? (Scale 0-10; or none, mild, moderate, severe)     3-4/ 10 8. TETANUS: For any breaks in the skin, ask: When was your last tetanus booster?      9. OTHER SYMPTOMS: Do you have any other symptoms?      Denies neuro symptoms of leg 10. PREGNANCY: Is there any chance you are pregnant? When was your last menstrual period?  Protocols used: Hip Injury-A-AH

## 2024-06-07 ENCOUNTER — Encounter: Payer: Self-pay | Admitting: Internal Medicine

## 2024-06-07 ENCOUNTER — Encounter: Payer: Self-pay | Admitting: Family Medicine

## 2024-06-07 ENCOUNTER — Ambulatory Visit: Admitting: Internal Medicine

## 2024-06-07 VITALS — BP 108/68 | HR 61 | Temp 97.7°F | Ht 62.5 in | Wt 160.2 lb

## 2024-06-07 DIAGNOSIS — Z8781 Personal history of (healed) traumatic fracture: Secondary | ICD-10-CM

## 2024-06-07 DIAGNOSIS — Z7901 Long term (current) use of anticoagulants: Secondary | ICD-10-CM

## 2024-06-07 DIAGNOSIS — S3282XS Multiple fractures of pelvis without disruption of pelvic ring, sequela: Secondary | ICD-10-CM

## 2024-06-07 DIAGNOSIS — S73192S Other sprain of left hip, sequela: Secondary | ICD-10-CM

## 2024-06-07 MED ORDER — TRAMADOL HCL 50 MG PO TABS: 50.0000 mg | ORAL_TABLET | Freq: Four times a day (QID) | ORAL | 0 refills | Status: AC | PRN

## 2024-06-07 NOTE — Progress Notes (Signed)
 "  Chief Complaint  Patient presents with   Acute Visit    Pt is here to discuss hip. Had a fall 20 days ago. Urgent care 2 days after. Xray show no fracture. Elbow, hip, shoulder pain.  Sport medicine- Dr. Ludie Comment. MRI show nondislocate fracture, on groin and hip area. Taking Tylenol  a lot for pain.     HPI: [name] 64 y.o. come in for advice on  current situation Unfortunate fall the  and continuing pain pelvic left  . Saw Dr Selmer  who ordered MRI  that showed 1. Nondisplaced fracture of the left pubic rami, left sacral ala, and left pubic body. 2. Tear of the anterior superior acetabular labrum. Was told to  give time and avoid painful activities and fu in about 3 weeks    She asks if  other opinon should be given  at this time. Has ?s   ROS: See pertinent positives and negatives per HPI. Had r side lateral  upper abd lateral  trunk pain but not severe and off and on ( cough from covid in gone)  not assoc with gi as possible   Past Medical History:  Diagnosis Date   Adjustment disorder with anxiety    B12 deficiency due to diet    is a vegetarian   Chest pain, unspecified    Chronic anticoagulation    Fatty liver    Per patient   GERD (gastroesophageal reflux disease)    Headache(784.0)    migraines and head pain   Hx of varicella    As Child   Iron deficiency anemia, unspecified    Otalgia, unspecified    Other and unspecified hyperlipidemia    Palpitations    Personal history of venous thrombosis and embolism    Recurrent UTI    Rheumatoid arthritis(714.0)    deveshewar   Unspecified vitamin D  deficiency    UTI (lower urinary tract infection) 11/28/2011   citrobacter       Family History  Problem Relation Age of Onset   Parkinsonism Mother    Dementia Father    Stroke Father    Hyperlipidemia Father    Diabetes Father    Lymphoma Other    Colon cancer Neg Hx    Colon polyps Neg Hx    Esophageal cancer Neg Hx    Stomach cancer Neg Hx     Rectal cancer Neg Hx    Breast cancer Neg Hx     Social History   Socioeconomic History   Marital status: Married    Spouse name: Not on file   Number of children: 1   Years of education: Not on file   Highest education level: Bachelor's degree (e.g., BA, AB, BS)  Occupational History   Occupation: home maker  Tobacco Use   Smoking status: Never    Passive exposure: Past   Smokeless tobacco: Never  Vaping Use   Vaping status: Never Used  Substance and Sexual Activity   Alcohol use: No   Drug use: Never   Sexual activity: Not on file  Other Topics Concern   Not on file  Social History Narrative   Left handed   Caffeine use: Tea daily, coffee once a week   Regular [place]Married HH  of 3 G1P1   Social Drivers of Health   Tobacco Use: Low Risk (06/07/2024)   Patient History    Smoking Tobacco Use: Never    Smokeless Tobacco Use:  Never    Passive Exposure: Past  Financial Resource Strain: Low Risk (04/08/2024)   Overall Financial Resource Strain (CARDIA)    Difficulty of Paying Living Expenses: Not hard at all  Food Insecurity: No Food Insecurity (04/08/2024)   Epic    Worried About Programme Researcher, Broadcasting/film/video in the Last Year: Never true    Ran Out of Food in the Last Year: Never true  Transportation Needs: No Transportation Needs (04/08/2024)   Epic    Lack of Transportation (Medical): No    Lack of Transportation (Non-Medical): No  Physical Activity: Unknown (04/08/2024)   Exercise Vital Sign    Days of Exercise per Week: 2 days    Minutes of Exercise per Session: Not on file  Recent Concern: Physical Activity - Insufficiently Active (02/17/2024)   Exercise Vital Sign    Days of Exercise per Week: 3 days    Minutes of Exercise per Session: 40 min  Stress: Stress Concern Present (04/08/2024)   [employer] of Occupational Health - Occupational Stress Questionnaire    Feeling of Stress: To some extent  Social Connections:  Moderately Isolated (04/08/2024)   Social Connection and Isolation Panel    Frequency of Communication with Friends and Family: Three times a week    Frequency of Social Gatherings with Friends and Family: Once a week    Attends Religious Services: Patient declined    Active Member of Clubs or Organizations: No    Attends Engineer, Structural: Not on file    Marital Status: Married  Depression (PHQ2-9): Low Risk (05/04/2024)   Depression (PHQ2-9)    PHQ-2 Score: 1  Alcohol Screen: Not on file  Housing: Unknown (04/08/2024)   Epic    Unable to Pay for Housing in the Last Year: No    Number of Times Moved in the Last Year: Not on file    Homeless in the Last Year: No  Utilities: Not on file  Health Literacy: Not on file    Outpatient Medications Prior to Visit  Medication Sig Dispense Refill   ALPRAZolam  (XANAX ) 0.25 MG tablet Take 1 tablet (0.25 mg total) by mouth 2 (two) times daily as needed for anxiety. 20 tablet 0   AZO D-MANNOSE PO Take by mouth.     Calcium Carb-Cholecalciferol 600-800 MG-UNIT TABS Take by mouth.     Cholecalciferol (VITAMIN D3) 2000 UNITS TABS Take 1 tablet by mouth daily.     Cyanocobalamin  (VITAMIN B-12 PO) Take by mouth once a week.     cycloSPORINE (RESTASIS) 0.05 % ophthalmic emulsion Place 1 drop into both eyes daily.     diclofenac Sodium (VOLTAREN) 1 % GEL Apply topically 4 (four) times daily.     Eflornithine HCl 13.9 % cream Apply topically daily.      folic acid  (FOLVITE ) 1 MG tablet TAKE 2 TABLETS(2 MG) BY MOUTH DAILY 180 tablet 3   hydroxychloroquine  (PLAQUENIL ) 200 MG tablet TAKE 1 TABLET BY MOUTH TWICE DAILY, MONDAY THROUGH FRIDAY ONLY 120 tablet 0   methotrexate  (RHEUMATREX) 2.5 MG tablet Take 8 tablets (20 mg total) by mouth once a week. 96 tablet 0   mineral/vitamin supplement (MULTIGEN) 70 MG TABS tablet TAKE 1 TABLET (70 MG TOTAL) BY MOUTH DAILY. 90 tablet 1   Omeprazole  Magnesium (PRILOSEC PO) Take by mouth 3 (three) times a  week.     Polyethylene Glycol 3350 (MIRALAX PO) Take by mouth as needed. (Patient taking differently: Take by mouth as needed. Every other night)  pregabalin  (LYRICA ) 50 MG capsule TAKE 1 CAPSULE(50 MG) BY MOUTH DAILY EVERY MORNING AND EVERY NIGHT AT BEDTIME 180 capsule 1   SUMAtriptan  (IMITREX ) 50 MG tablet TAKE ONE TABLET BY MOUTH AS NEEDED. MAY REPEAT IN 2 TO 4 HOURS IF NEEDED. 9 tablet 1   warfarin (COUMADIN ) 2.5 MG tablet TAKE 2 TABLETS BY MOUTH DAILY, EXCEPT TAKE  1 TABLET ON SUNDAYS AND THURSDAYS AND TAKE 1 1/2 TABLETS ON TUESDAYS OR AS DIRECTED BY ANTICOAGULATION CLINIC 180 tablet 1   azelastine  (ASTELIN ) 0.1 % nasal spray Place 2 sprays into both nostrils 2 (two) times daily. Use in each nostril as directed (Patient not taking: Reported on 05/10/2024) 30 mL 1   No facility-administered medications prior to visit.     EXAM:  BP 108/68 (BP Location: Right Arm, Patient Position: Sitting, Cuff Size: Large)   Pulse 61   Temp 97.7 F (36.5 C) (Oral)   Ht 5' 2.5 (1.588 m)   Wt 160 lb 3.2 oz (72.7 kg)   SpO2 97%   BMI 28.83 kg/m   Body mass index is 28.83 kg/m.  GENERAL: vitals reviewed and listed above, alert, oriented, appears well hydrated and in no acute distress gait a bit antalhgic  but I ndependent  HEENT: atraumatic, conjunctiva  clear, no obvious abnormalities on inspection of external nose and ears  NECK: no obvious masses on inspection palpation  MS: moves all extremities without noticeable focal  abnormality PSYCH: pleasant and cooperative, no obvious depression or anxiety Lab Results  Component Value Date   WBC 7.5 01/22/2024   HGB 14.2 01/22/2024   HCT 43.5 01/22/2024   PLT 240 01/22/2024   GLUCOSE 93 01/22/2024   CHOL 214 (H) 10/07/2023   TRIG 139.0 10/07/2023   HDL 75.50 10/07/2023   LDLDIRECT 97.3 05/25/2013   LDLCALC 111 (H) 10/07/2023   ALT 12 01/22/2024   AST 19 01/22/2024   NA 141 01/22/2024   K 4.4 01/22/2024   CL 106 01/22/2024   CREATININE  0.80 01/22/2024   BUN 11 01/22/2024   CO2 28 01/22/2024   TSH 3.30 10/07/2023   INR 2.5 06/06/2024   HGBA1C 6.0 10/07/2023   MICROALBUR <0.7 10/07/2023   BP Readings from Last 3 Encounters:  06/07/24 108/68  05/30/24 123/64  05/10/24 108/62  Mri report review and  anatomy discussed   ASSESSMENT AND PLAN:  Discussed the following assessment and plan:  Multiple closed fractures of pelvis without disruption of pelvic ring, sequela - by mri  History of fracture due to fall  Tear of left acetabular labrum, sequela - by mri  Long term (current) use of anticoagulants [Z79.01] Reinforced   dec activity from pain and fall avoidance  and fu as planned    If not improving and  persistent or progressive she can consider  fu with ortho also . Advised  updated osteoporosis   medication intervention  and fall prevention  If side pain progressed then fu  ? If from coughing from covid now resolved  32 minutes  review  disc  counsel  -Patient advised to return or notify health care team  if  new concerns arise.  There are no Patient Instructions on file for this visit.   Fontaine Kossman K. Yehudis Monceaux M.D. "

## 2024-06-07 NOTE — Telephone Encounter (Signed)
 Pt was seen today.

## 2024-06-07 NOTE — Telephone Encounter (Signed)
 Had visit today.

## 2024-06-08 ENCOUNTER — Encounter: Payer: Self-pay | Admitting: Family Medicine

## 2024-06-08 NOTE — Telephone Encounter (Signed)
 Called and spoke to pt. She has not activated the monitor that was mailed to here around Christmas. She had a fall and required Xrays and MRI and may end up needing to have surgery. Her cardiac symptoms remain stable, nothing new/concerning since she last saw Loni, MD on 05/10/2024.  She is concerned about being CHARGED for it if she returns it. She thinks that maybe she has until May 2026 to activate/use it. She keeps receiving text messages regarding the monitor.

## 2024-06-13 ENCOUNTER — Ambulatory Visit: Admitting: Psychology

## 2024-06-13 ENCOUNTER — Ambulatory Visit

## 2024-06-13 DIAGNOSIS — F411 Generalized anxiety disorder: Secondary | ICD-10-CM | POA: Diagnosis not present

## 2024-06-13 NOTE — Progress Notes (Signed)
 "                                                                                                                                                                          06/13/2024  Treatrment Plan:  Diagnosis F41.1 (Generalized anxiety disorder) [n/a]  Z62.820 (Parent child relationship problem) [n/a]  Symptoms Excessive and/or unrealistic worry that is difficult to control occurring more days than not for at least 6 months about a number of events or activities. (Status: maintained) -- No Description Entered  Regularly overindulges their child's wishes and demands. (Status: maintained) -- No Description Entered  Medication Status compliance  Safety none  If Suicidal or Homicidal State Action Taken: unspecified  Current Risk: low Medications unspecified Objectives Related Problem: Achieve a level of competent, effective parenting. Description: Increase the gradual letting go of their adolescent in constructive, affirmative ways. Target Date: 2025-05-14 Frequency: Daily Modality: individual Progress: 95%  Related Problem: Achieve a level of competent, effective parenting. Description: Identify unresolved childhood issues that affect parenting and work toward their resolution. Target Date: 2025-05-14 Frequency: Daily Modality: individual Progress: 100%  Related Problem: Achieve a level of competent, effective parenting. Description: Freely express feelings of frustration, helplessness, and inadequacy that each experiences in the parenting role. Target Date: 2025-05-14 Frequency: Daily Modality: individual Progress: 85%  Related Problem: Resolve the core conflict that is the source of anxiety. Description: Learn and implement problem-solving strategies for realistically addressing worries. Target Date:  2025-05-14 Frequency: Daily Modality: individual Progress: 85%  Related Problem: Resolve the core conflict that is the source of anxiety. Description: Learn and implement calming skills to reduce overall anxiety and manage anxiety symptoms. Target Date: 2025-05-14 Frequency: Daily Modality: individual Progress: 75%  Related Problem: Resolve the core conflict that is the source of anxiety. Description: Describe situations, thoughts, feelings, and actions associated with anxieties and worries, their impact on functioning, and attempts to resolve them. Target Date: 2025-05-14 Frequency: Daily Modality: individual Progress: 85%  Client Response full compliance  Service Location Location, [address] [place], [place] [address]  Service Code cpt [phone]  Behavioral activation plan  Facilitate problem solving  Identify automatic thoughts  Emotion regulation skills  Provide education, information  Self care activities  Relaxation training  Lifestyle change (exercise, nutrition)  Self-monitoring  Validate/empathize  Session notes: F 41.1  Goals: She has chronic anxieties, mostly related to health concerns and her son's well-being. Would like to develop strategies to manage symptoms of anxiety. Needs to understand enmeshment and learn to define and manage appropriate family boundaries. Target date is 12-23. Patient has realized significant improvement in recognizing and establishing appropriate boundaries. Anxiety about health and family matters persist and she desires to continue treatment in an  effort to manage and reduce these feelings. Revised target date is 12-26.  Meds: Xanax  (.25 mg);   Patient agrees to a video session and is aware of the limitations of this platform. She is at home and I am in my home office.   [place]: Banafshef talked about her son's visit during the holidays. He ended up going to lots of doctor appointments and was socially withdrawn. He mostly  stayed around home and rested. Seylah says her moods are mostly linked to [name]'s mental state. She reports far less anxiety when she feels that her son is happier and taking care of himself. She also has worries about her own physical health. She fell on New Year's Eve and has some bone fractures. Is having to rest easy, which has been a challenge for her. When dealing with health related issues, Illyria tends to get more anxious. Talked about ways to put things in perspective and not perseverate on the negative.                                                                                                                                                            CONI ALM KERNS, PhD 10:40a-11:30a 50 min         "

## 2024-06-14 ENCOUNTER — Ambulatory Visit: Admitting: Internal Medicine

## 2024-06-14 NOTE — Progress Notes (Unsigned)
 "  Office Visit Note  Patient: [name]             Date of Birth: Nov 02, 1960           MRN: [id number]             PCP: Charlett Apolinar POUR, MD Referring: Charlett Apolinar POUR, MD Visit Date: 06/28/2024 Occupation: Data Unavailable  Subjective:  No chief complaint on file.   History of Present Illness: [name] is a 64 y.o. female ***     Activities of Daily Living:  Patient reports morning stiffness for *** {minute/hour:19697}.   Patient {ACTIONS;DENIES/REPORTS:21021675::Denies} nocturnal pain.  Difficulty dressing/grooming: {ACTIONS;DENIES/REPORTS:21021675::Denies} Difficulty climbing stairs: {ACTIONS;DENIES/REPORTS:21021675::Denies} Difficulty getting out of chair: {ACTIONS;DENIES/REPORTS:21021675::Denies} Difficulty using hands for taps, buttons, cutlery, and/or writing: {ACTIONS;DENIES/REPORTS:21021675::Denies}  No Rheumatology ROS completed.   PMFS History:  Patient Active Problem List   Diagnosis Date Noted   Lumbosacral radiculopathy at L5 10/08/2022   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HCC) 01/06/2018   Long term (current) use of anticoagulants 02/11/2017   Sicca syndrome 12/22/2016   ANA positive 12/22/2016   Raised intraocular pressure of both eyes 12/22/2016   Migraine 03/31/2016   Anxiety 03/31/2016   High risk medication use 03/31/2016   Primary osteoarthritis of both feet 03/31/2016   Primary osteoarthritis of both knees 03/31/2016   Stiffness of left hand joint 11/27/2014   Elevated IOP 06/09/2014   Long term current use of systemic steroids 06/09/2014   Medically complex patient 06/09/2014   Current use of steroid medication 01/09/2014   Xerosis of skin 12/07/2013   Encounter for therapeutic drug monitoring 06/09/2013   B12 deficiency 05/31/2013   Iron deficiency 05/31/2013   Visit for preventive health examination 05/25/2012   Mammogram abnormal 04/12/2012   Iliac crest  pain 01/23/2012   Vaginal atrophy 01/23/2012    History of recurrent UTI (urinary tract infection) 01/23/2012   Osteoporosis 04/29/2011   Nonspecific abnormal results of liver function study 12/08/2010   Chronic anticoagulation    Anticoagulant long-term use 07/15/2010   Anticardiolipin antibody positive 07/15/2010   Chronic cough 03/19/2009   CHEST PAIN-UNSPECIFIED 08/26/2008   PALPITATIONS, RECURRENT 07/24/2008   Vitamin D  deficiency 05/30/2008   HYPERLIPIDEMIA 05/30/2008   IRON DEFICIENCY 05/30/2008   Adjustment disorder with anxiety 05/30/2008   Rheumatoid arthritis (HCC) 05/30/2008   PULMONARY EMBOLISM, HX OF 05/30/2008    Past Medical History:  Diagnosis Date   Adjustment disorder with anxiety    B12 deficiency due to diet    is a vegetarian   Chest pain, unspecified    Chronic anticoagulation    Fatty liver    Per patient   GERD (gastroesophageal reflux disease)    Headache(784.0)    migraines and head pain   Hx of varicella    As Child   Iron deficiency anemia, unspecified    Otalgia, unspecified    Other and unspecified hyperlipidemia    Palpitations    Personal history of venous thrombosis and embolism    Recurrent UTI    Rheumatoid arthritis(714.0)    [name]   Unspecified vitamin D  deficiency    UTI (lower urinary tract infection) 11/28/2011   citrobacter       Family History  Problem Relation Age of Onset   Parkinsonism Mother    Dementia Father    Stroke Father    Hyperlipidemia Father    Diabetes Father    Lymphoma Other    Colon cancer Neg Hx    Colon polyps  Neg Hx    Esophageal cancer Neg Hx    Stomach cancer Neg Hx    Rectal cancer Neg Hx    Breast cancer Neg Hx    Past Surgical History:  Procedure Laterality Date   ORIF ULNAR FRACTURE     TYMPANOSTOMY TUBE PLACEMENT  2009   Right   Social History[1] Social History   Social History Narrative   Left handed   Caffeine use: Tea daily, coffee once a week   Regular [place]20 yrs [place] [facility]Married HH  of 3  G1P1     Immunization History  Administered Date(s) Administered   Influenza, Seasonal, Injecte, Preservative Fre 03/03/2023, 03/09/2024   Influenza,inj,Quad PF,6+ Mos 04/19/2013, 06/27/2015, 03/05/2016, 02/11/2017, 03/15/2018, 02/23/2019, 04/02/2020, 03/13/2021, 02/26/2022   PFIZER(Purple Top)SARS-COV-2 Vaccination 08/06/2019, 08/25/2019, 01/12/2020, 11/10/2020   PPD Test 07/21/2016   Tdap 05/31/2013, 05/19/2024   Zoster Recombinant(Shingrix) 01/23/2021, 06/11/2021     Objective: Vital Signs: There were no vitals taken for this visit.   Physical Exam   Musculoskeletal Exam: ***  CDAI Exam: CDAI Score: -- Patient Global: --; Provider Global: -- Swollen: --; Tender: -- Joint Exam 06/28/2024   No joint exam has been documented for this visit   There is currently no information documented on the homunculus. Go to the Rheumatology activity and complete the homunculus joint exam.  Investigation: No additional findings.  Imaging: MR HIP LEFT WO CONTRAST Result Date: 06/02/2024 EXAM: MRI of the left HIP WITHOUT contrast. 06/02/2024 07:05:30 AM TECHNIQUE: Multiplanar multisequence MRI of the left hip was performed without the administration of intravenous contrast. COMPARISON: CT pelvis 04/29/2023. CLINICAL HISTORY: Left hip pain for 2 weeks after a fall. FINDINGS: BONE MARROW: Nondisplaced fracture of the left pubic body including the adjacent superior pubic ramus. Nondisplaced fracture of the left inferior pubic ramus with surrounding mild soft tissue edema and edema tracking along the hip adductor musculature. Nondisplaced fracture of the left superior pubic ramus laterally adjacent to the anterior wall of the acetabulum, definite extension into the acetabular articular surface uncertain. Spondylolisthesis compatible with nondisplaced fracture in the left sacral ala. No fracture or acute bony findings involving the left proximal femur. HIP JOINT: No hip effusion. No subchondral cysts  or significant degenerative disease. Definite extension of the superior pubic ramus fracture into the acetabular articular surface is uncertain. LABRUM: Tear of the anterior superior acetabular labrum shown on images 12 through 14 of series 13. BURSAE: No significant iliopsoas or trochanteric bursitis. SCIATIC NERVE: Unremarkable MRI appearance of the sciatic nerve. MUSCLES AND TENDONS: Edema tracking along the hip adductor musculature associated with the inferior pubic ramus fracture. No gluteal tendon tear. Intact hamstrings tendon origin. No significant muscle edema or atrophy beyond that described. SOFT TISSUES: Mild soft tissue edema surrounding the inferior pubic ramus fracture. No focal soft tissue mass. INTRAPELVIC CONTENTS: Limited images of the intrapelvic contents demonstrate no acute abnormality. IMPRESSION: 1. Nondisplaced fracture of the left pubic rami, left sacral ala, and left pubic body. 2. Tear of the anterior superior acetabular labrum. Electronically signed by: Ryan Salvage MD 06/02/2024 08:14 AM EST RP Workstation: [id number]   MM 3D DIAGNOSTIC MAMMOGRAM UNILATERAL LEFT BREAST Result Date: 06/01/2024 CLINICAL DATA:  64 year old female with palpable lump in the medial lower left breast anterior depth for 2 months. EXAM: DIGITAL DIAGNOSTIC UNILATERAL LEFT MAMMOGRAM WITH TOMOSYNTHESIS AND CAD; ULTRASOUND LEFT BREAST LIMITED TECHNIQUE: Left digital diagnostic mammography and breast tomosynthesis was performed. The images were evaluated with computer-aided detection. ; Targeted ultrasound examination  of the left breast was performed. COMPARISON:  Previous exam(s). ACR Breast Density Category b: There are scattered areas of fibroglandular density. FINDINGS: Full field cc and MLO views as well as focal spot compression tomosynthesis views of the palpable area of concern were obtained of the left breast. There is an oval stable benign 5 mm mass with circumscribed margins subjacent to the BB  denoting the site of palpable concern in the medial lower left breast anterior depth. This mass is unchanged compared to prior mammograms dating back to 2020, and given its stability for greater than 2 years is considered benign in etiology. There is no mammographic evidence of malignancy or other suspicious findings seen in the left breast. On physical exam, the area of palpable concern corresponds with a small raised skin lesion along the lower left areola at approximately 8 o'clock. Sonography over this palpable lump at 8 o'clock demonstrates a small 5 mm hypoechoic oval lesion in the skin which may represent a sebaceous cyst or prominent Montgomery tubercle. IMPRESSION: No mammographic or sonographic evidence of malignancy. Palpable area of concern along the left areola corresponds to a small benign skin lesion. RECOMMENDATION: Routine annual screening mammogram in June 2026. I have discussed the findings and recommendations with the patient. If applicable, a reminder letter will be sent to the patient regarding the next appointment. BI-RADS CATEGORY  2: Benign. Electronically Signed   By: Rosina Gelineau M.D.   On: 06/01/2024 15:10   US  LIMITED ULTRASOUND INCLUDING AXILLA LEFT BREAST  Result Date: 06/01/2024 CLINICAL DATA:  64 year old female with palpable lump in the medial lower left breast anterior depth for 2 months. EXAM: DIGITAL DIAGNOSTIC UNILATERAL LEFT MAMMOGRAM WITH TOMOSYNTHESIS AND CAD; ULTRASOUND LEFT BREAST LIMITED TECHNIQUE: Left digital diagnostic mammography and breast tomosynthesis was performed. The images were evaluated with computer-aided detection. ; Targeted ultrasound examination of the left breast was performed. COMPARISON:  Previous exam(s). ACR Breast Density Category b: There are scattered areas of fibroglandular density. FINDINGS: Full field cc and MLO views as well as focal spot compression tomosynthesis views of the palpable area of concern were obtained of the left breast.  There is an oval stable benign 5 mm mass with circumscribed margins subjacent to the BB denoting the site of palpable concern in the medial lower left breast anterior depth. This mass is unchanged compared to prior mammograms dating back to 2020, and given its stability for greater than 2 years is considered benign in etiology. There is no mammographic evidence of malignancy or other suspicious findings seen in the left breast. On physical exam, the area of palpable concern corresponds with a small raised skin lesion along the lower left areola at approximately 8 o'clock. Sonography over this palpable lump at 8 o'clock demonstrates a small 5 mm hypoechoic oval lesion in the skin which may represent a sebaceous cyst or prominent Montgomery tubercle. IMPRESSION: No mammographic or sonographic evidence of malignancy. Palpable area of concern along the left areola corresponds to a small benign skin lesion. RECOMMENDATION: Routine annual screening mammogram in June 2026. I have discussed the findings and recommendations with the patient. If applicable, a reminder letter will be sent to the patient regarding the next appointment. BI-RADS CATEGORY  2: Benign. Electronically Signed   By: Rosina Gelineau M.D.   On: 06/01/2024 15:10    Recent Labs: Lab Results  Component Value Date   WBC 7.5 01/22/2024   HGB 14.2 01/22/2024   PLT 240 01/22/2024   NA 141 01/22/2024  K 4.4 01/22/2024   CL 106 01/22/2024   CO2 28 01/22/2024   GLUCOSE 93 01/22/2024   BUN 11 01/22/2024   CREATININE 0.80 01/22/2024   BILITOT 0.6 01/22/2024   ALKPHOS 86 10/07/2023   AST 19 01/22/2024   ALT 12 01/22/2024   PROT 6.8 01/22/2024   ALBUMIN 4.3 10/07/2023   CALCIUM 9.3 01/22/2024   GFRAA 96 07/26/2020   QFTBGOLDPLUS NEGATIVE 09/12/2020    Speciality Comments: PLQ Eye Exam: 03/17/2024 WNL @ [facility] Follow up 1 year Ttd with Fosamax  for 5 years in the past Fosamax  restarted 05/2020  Procedures:  No procedures  performed Allergies: Boniva [ibandronate sodium], Gadolinium derivatives, Ivp dye [iodinated contrast media], Other, Risedronate sodium, Risedronate sodium [risedronate sodium], and Macrobid  [nitrofurantoin ]   Assessment / Plan:     Visit Diagnoses: No diagnosis found.  Orders: No orders of the defined types were placed in this encounter.  No orders of the defined types were placed in this encounter.   Face-to-face time spent with patient was *** minutes. Greater than 50% of time was spent in counseling and coordination of care.  Follow-Up Instructions: No follow-ups on file.   Maya Nash, MD  Note - This record has been created using Animal nutritionist.  Chart creation errors have been sought, but may not always  have been located. Such creation errors do not reflect on  the standard of medical care.    [1]  Social History Tobacco Use   Smoking status: Never    Passive exposure: Past   Smokeless tobacco: Never  Vaping Use   Vaping status: Never Used  Substance Use Topics   Alcohol use: No   Drug use: Never   "

## 2024-06-15 ENCOUNTER — Ambulatory Visit

## 2024-06-15 DIAGNOSIS — Z7901 Long term (current) use of anticoagulants: Secondary | ICD-10-CM

## 2024-06-15 LAB — POCT INR
INR: 1.5 — AB (ref 2.0–3.0)
INR: 1.8 — AB (ref 2.0–3.0)

## 2024-06-15 NOTE — Patient Instructions (Addendum)
 Pre visit review using our clinic review tool, if applicable. No additional management support is needed unless otherwise documented below in the visit note.  Continue 5 mg (2 tablets) daily except take 2.5 mg (1 tablet) on Sundays and Thursdays and take 3.75 mg (1 1/2 tablets) on Tuesdays. Recheck in 3 weeks.

## 2024-06-15 NOTE — Progress Notes (Signed)
 Indication: PE 2006  Pt only had one POCT INR today. The 1.5 INR result is an error.  Continue 5 mg (2 tablets) daily except take 2.5 mg (1 tablet) on Sundays and Thursdays and take 3.75 mg (1 1/2 tablets) on Tuesdays. Recheck in 3 weeks.

## 2024-06-17 ENCOUNTER — Ambulatory Visit: Payer: Self-pay

## 2024-06-17 ENCOUNTER — Ambulatory Visit: Admitting: Family Medicine

## 2024-06-17 ENCOUNTER — Encounter: Payer: Self-pay | Admitting: Family Medicine

## 2024-06-17 VITALS — BP 118/82 | HR 63 | Temp 97.4°F | Ht 62.5 in | Wt 163.0 lb

## 2024-06-17 DIAGNOSIS — N3001 Acute cystitis with hematuria: Secondary | ICD-10-CM | POA: Diagnosis not present

## 2024-06-17 DIAGNOSIS — R350 Frequency of micturition: Secondary | ICD-10-CM

## 2024-06-17 DIAGNOSIS — M069 Rheumatoid arthritis, unspecified: Secondary | ICD-10-CM

## 2024-06-17 LAB — POC URINALSYSI DIPSTICK (AUTOMATED)
Bilirubin, UA: NEGATIVE
Glucose, UA: NEGATIVE
Ketones, UA: NEGATIVE
Nitrite, UA: NEGATIVE
Protein, UA: NEGATIVE
Spec Grav, UA: 1.01
Urobilinogen, UA: 0.2 U/dL
pH, UA: 6.5

## 2024-06-17 MED ORDER — CIPROFLOXACIN HCL 250 MG PO TABS: 250.0000 mg | ORAL_TABLET | Freq: Two times a day (BID) | ORAL | 0 refills | Status: DC

## 2024-06-17 NOTE — Telephone Encounter (Signed)
 Noted.

## 2024-06-17 NOTE — Telephone Encounter (Signed)
 Pt reports she was diagnosed with a UTI and prescribed cipro , 250 mg, BID. This has a potential to interact with warfarin. But past prescriptions for this pt have not changed INR when she has taken this same abx.   Advised no change in dosing. Advised could recheck next week on 2/4. Pt made apt for INR check for 2/4.

## 2024-06-17 NOTE — Telephone Encounter (Signed)
 FYI Only or Action Required?: FYI only for provider: appointment scheduled on 1/30.  Patient was last seen in primary care on 06/07/2024 by Panosh, Apolinar POUR, MD.  Called Nurse Triage reporting Dysuria.  Symptoms began yesterday.  Interventions attempted: OTC medications: D-Mannose.  Symptoms are: gradually worsening.  Triage Disposition: See HCP Within 4 Hours (Or PCP Triage)  Patient/caregiver understands and will follow disposition?: Yes   Pt reports frequent UTIs d/t being on immunosuppressants. Yesterday onset of urinary frequency and mild pain with urination. No blood, cloudiness or foul odor. No flank pain. No fever.  Scheduled appt with different provider at home office today d/t no PCP availability within timeframe. Advised UC or ED for worsening symptoms.    Message from Avram MATSU sent at 06/17/2024 11:53 AM EST  Reason for Triage: burning, frequent urination, fullness and inflammation.   Reason for Disposition  Diabetes mellitus or weak immune system (e.g., HIV positive, cancer chemo, splenectomy, organ transplant, chronic steroids)  Answer Assessment - Initial Assessment Questions 1. SEVERITY: How bad is the pain?  (e.g., Scale 1-10; mild, moderate, or severe)     Urinary frequency, pain with urination  2. FREQUENCY: How many times have you had painful urination today?      Urinary frequency  3. PATTERN: Is pain present every time you urinate or just sometimes?      Intermittent  4. ONSET: When did the painful urination start?      Yesterday  5. FEVER: Do you have a fever? If Yes, ask: What is your temperature, how was it measured, and when did it start?     No  6. PAST UTI: Have you had a urine infection before? If Yes, ask: When was the last time? and What happened that time?      Yes, takes abxs  7. CAUSE: What do you think is causing the painful urination?  (e.g., UTI, scratch, Herpes sore)     UTI  8. OTHER SYMPTOMS: Do you have  any other symptoms? (e.g., blood in urine, flank pain, genital sores, urgency, vaginal discharge)     No  Protocols used: Urination Pain - Female-A-AH

## 2024-06-21 ENCOUNTER — Ambulatory Visit: Admitting: Internal Medicine

## 2024-06-22 ENCOUNTER — Ambulatory Visit: Admitting: Family Medicine

## 2024-06-22 ENCOUNTER — Ambulatory Visit

## 2024-06-22 ENCOUNTER — Encounter: Payer: Self-pay | Admitting: Family Medicine

## 2024-06-22 DIAGNOSIS — N3001 Acute cystitis with hematuria: Secondary | ICD-10-CM

## 2024-06-22 DIAGNOSIS — Z7901 Long term (current) use of anticoagulants: Secondary | ICD-10-CM

## 2024-06-22 LAB — POCT INR: INR: 1.8 — AB (ref 2.0–3.0)

## 2024-06-22 MED ORDER — WARFARIN SODIUM 2.5 MG PO TABS: ORAL_TABLET | ORAL | 1 refills | Status: AC

## 2024-06-22 NOTE — Patient Instructions (Addendum)
 Pre visit review using our clinic review tool, if applicable. No additional management support is needed unless otherwise documented below in the visit note.  Continue 5 mg (2 tablets) daily except take 2.5 mg (1 tablet) on Sundays and Thursdays and take 3.75 mg (1 1/2 tablets) on Tuesdays. Recheck in 4 weeks.

## 2024-06-22 NOTE — Progress Notes (Signed)
 Indication: PE 2006  Pt was diagnosed with a UTI on 1/30 and prescribed ciprofloxacin , 250 mg BID, x 5 days. This has a potential interaction with warfarin. No interaction for this pt with last script of cipro  earlier this year. Will check INR to assure no interaction with this script. Continue 5 mg (2 tablets) daily except take 2.5 mg (1 tablet) on Sundays and Thursdays and take 3.75 mg (1 1/2 tablets) on Tuesdays. Recheck in 4 weeks.   Pt is compliant with warfarin management and PCP apts.  Sent in refill of warfarin to requested pharmacy.

## 2024-06-23 ENCOUNTER — Telehealth: Payer: Self-pay | Admitting: Rheumatology

## 2024-06-23 MED ORDER — CIPROFLOXACIN HCL 250 MG PO TABS: 250.0000 mg | ORAL_TABLET | Freq: Two times a day (BID) | ORAL | 0 refills | Status: AC

## 2024-06-23 NOTE — Telephone Encounter (Signed)
 Noted. Will have Dr. Dolphus review/complete once we receive the clearance form.

## 2024-06-23 NOTE — Telephone Encounter (Signed)
 Dr. Osa office called from [facility] to advise Dr. Dolphus that they will be sending over a clearance form on behalf of patient for an upcoming procedure.

## 2024-06-27 ENCOUNTER — Ambulatory Visit: Admitting: Family Medicine

## 2024-06-27 ENCOUNTER — Ambulatory Visit: Admitting: Psychology

## 2024-06-28 ENCOUNTER — Ambulatory Visit: Admitting: Rheumatology

## 2024-06-28 DIAGNOSIS — Z79899 Other long term (current) drug therapy: Secondary | ICD-10-CM

## 2024-06-28 DIAGNOSIS — Z8639 Personal history of other endocrine, nutritional and metabolic disease: Secondary | ICD-10-CM

## 2024-06-28 DIAGNOSIS — G8929 Other chronic pain: Secondary | ICD-10-CM

## 2024-06-28 DIAGNOSIS — Z7901 Long term (current) use of anticoagulants: Secondary | ICD-10-CM

## 2024-06-28 DIAGNOSIS — H40053 Ocular hypertension, bilateral: Secondary | ICD-10-CM

## 2024-06-28 DIAGNOSIS — Z8669 Personal history of other diseases of the nervous system and sense organs: Secondary | ICD-10-CM

## 2024-06-28 DIAGNOSIS — N390 Urinary tract infection, site not specified: Secondary | ICD-10-CM

## 2024-06-28 DIAGNOSIS — Z86711 Personal history of pulmonary embolism: Secondary | ICD-10-CM

## 2024-06-28 DIAGNOSIS — M170 Bilateral primary osteoarthritis of knee: Secondary | ICD-10-CM

## 2024-06-28 DIAGNOSIS — M3500 Sicca syndrome, unspecified: Secondary | ICD-10-CM

## 2024-06-28 DIAGNOSIS — M19071 Primary osteoarthritis, right ankle and foot: Secondary | ICD-10-CM

## 2024-06-28 DIAGNOSIS — M0609 Rheumatoid arthritis without rheumatoid factor, multiple sites: Secondary | ICD-10-CM

## 2024-06-28 DIAGNOSIS — K760 Fatty (change of) liver, not elsewhere classified: Secondary | ICD-10-CM

## 2024-06-28 DIAGNOSIS — M47816 Spondylosis without myelopathy or radiculopathy, lumbar region: Secondary | ICD-10-CM

## 2024-06-28 DIAGNOSIS — M810 Age-related osteoporosis without current pathological fracture: Secondary | ICD-10-CM

## 2024-07-06 ENCOUNTER — Ambulatory Visit

## 2024-07-11 ENCOUNTER — Ambulatory Visit: Admitting: Psychology

## 2024-07-13 ENCOUNTER — Ambulatory Visit: Payer: 59 | Admitting: Neurology

## 2024-07-20 ENCOUNTER — Ambulatory Visit

## 2024-07-25 ENCOUNTER — Ambulatory Visit: Admitting: Psychology

## 2024-08-08 ENCOUNTER — Ambulatory Visit: Admitting: Psychology

## 2024-08-22 ENCOUNTER — Ambulatory Visit: Admitting: Psychology

## 2024-08-25 ENCOUNTER — Other Ambulatory Visit (HOSPITAL_BASED_OUTPATIENT_CLINIC_OR_DEPARTMENT_OTHER)

## 2024-09-05 ENCOUNTER — Ambulatory Visit: Admitting: Psychology
